# Patient Record
Sex: Male | Born: 1965 | Race: White | Hispanic: No | Marital: Single | Smoking: Current every day smoker
Health system: Southern US, Community
[De-identification: ages and names within clinical notes are randomized; demographics above are authoritative.]

## PROBLEM LIST (undated history)

## (undated) ENCOUNTER — Emergency Department (HOSPITAL_BASED_OUTPATIENT_CLINIC_OR_DEPARTMENT_OTHER): Admission: EM | Discharge status: Absent without leave | Payer: Self-pay

## (undated) DIAGNOSIS — I639 Cerebral infarction, unspecified: Secondary | ICD-10-CM

## (undated) DIAGNOSIS — I2510 Atherosclerotic heart disease of native coronary artery without angina pectoris: Secondary | ICD-10-CM

## (undated) DIAGNOSIS — I219 Acute myocardial infarction, unspecified: Secondary | ICD-10-CM

## (undated) DIAGNOSIS — F419 Anxiety disorder, unspecified: Secondary | ICD-10-CM

## (undated) HISTORY — PX: APPENDECTOMY: SHX54

## (undated) HISTORY — PX: SHOULDER SURGERY: SHX246

## (undated) NOTE — *Deleted (*Deleted)
1.  We will schedule a lumbar puncture for you.  No aspirin or aspirin containing products or blood thinners at least 5 days prior to procedure 2.  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  We referred you back to Dr. Darrick Penna for consultation for that. 3.  We have sent a referral to [facility] for your MRV of the brain and they will call you directly to schedule your appointment. They are located at [address] [facility]. If you need to contact them directly please call [phone].

---

## 2000-02-05 ENCOUNTER — Emergency Department (HOSPITAL_COMMUNITY): Admission: EM | Admit: 2000-02-05 | Discharge: 2000-02-05 | Payer: Self-pay | Admitting: Emergency Medicine

## 2004-07-28 ENCOUNTER — Emergency Department (HOSPITAL_COMMUNITY): Admission: EM | Admit: 2004-07-28 | Discharge: 2004-07-28 | Payer: Self-pay | Admitting: Emergency Medicine

## 2005-09-09 ENCOUNTER — Emergency Department (HOSPITAL_COMMUNITY): Admission: EM | Admit: 2005-09-09 | Discharge: 2005-09-09 | Payer: Self-pay | Admitting: Emergency Medicine

## 2005-11-07 ENCOUNTER — Encounter: Payer: Self-pay | Admitting: Emergency Medicine

## 2005-11-08 ENCOUNTER — Inpatient Hospital Stay (HOSPITAL_COMMUNITY): Admission: EM | Admit: 2005-11-08 | Discharge: 2005-11-09 | Payer: Self-pay | Admitting: Internal Medicine

## 2006-10-08 ENCOUNTER — Emergency Department (HOSPITAL_COMMUNITY): Admission: EM | Admit: 2006-10-08 | Discharge: 2006-10-08 | Payer: Self-pay | Admitting: Emergency Medicine

## 2008-05-24 ENCOUNTER — Emergency Department (HOSPITAL_BASED_OUTPATIENT_CLINIC_OR_DEPARTMENT_OTHER): Admission: EM | Admit: 2008-05-24 | Discharge: 2008-05-24 | Payer: Self-pay | Admitting: Emergency Medicine

## 2008-05-24 ENCOUNTER — Ambulatory Visit: Payer: Self-pay | Admitting: Diagnostic Radiology

## 2010-02-26 ENCOUNTER — Encounter: Payer: Self-pay | Admitting: Family Medicine

## 2010-06-23 NOTE — H&P (Signed)
Ralph [name], Ralph [name] NO.:  [id number]   MEDICAL RECORD NO.:  [id number]          PATIENT TYPE:  INP   LOCATION:  3705                         FACILITY:  [facility]   PHYSICIAN:  Della Goo, M.D. DATE OF BIRTH:  1965-02-15   DATE OF ADMISSION:  11/08/2005  DATE OF DISCHARGE:  11/09/2005                                HISTORY & PHYSICAL   CHIEF COMPLAINT:  Chest pain.   HISTORY OF PRESENT ILLNESS:  This is a 45 year old male who presented to the  emergency department via ambulance secondary to worsening chest pain that  was associated with shortness of breath, nausea, and diaphoresis.  Patient  reports having chest pain which was described as a dull left-sided chest  area pain for 2 weeks which began to worsen over the past 24 hours when it  was associated with nausea, shortness of breath, and diaphoresis.  Patient  denies having any cough, fevers, chills.  He did report having a radiation  of discomfort into his left arm which was described as being numbness that  started upon awakening the day before, and lasted until his arrival in the  emergency department.  However, symptoms of this discomfort and numbness did  gradually improve over the day.  He does state that his arm discomfort could  have been from sleeping on his arm.   PAST MEDICAL HISTORY:  1. Hypertension.  2. 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   MEDICATIONS:  1. Lisinopril 20 mg 1 p.o. daily.  2. Aspirin 81 mg 1 p.o. daily.   ALLERGIES:  NO KNOWN DRUG ALLERGIES.   SOCIAL HISTORY:  Tobacco usage, 1 to 1-1/2 packs per day.  Previous heavy  drinking.  No history of drug abuse.   REVIEW OF SYSTEMS:  Mentioned above.   FAMILY HISTORY:  Both parents with hypertension.  No history of coronary  artery disease, or diabetes, or cancer.   PHYSICAL EXAMINATION:  A 45 year old well-nourished, well-developed male in  discomfort, but no acute distress.   VITAL SIGNS:  On admission temperature  97, blood pressure initially 158/110, heart rate 79, respirations 20, O2  saturations 98%.  Recheck of his vitals reveal a blood pressure of 138/94  after administration of sublingual nitroglycerine and application of  nitropaste.  HEENT:  Normocephalic, atraumatic.  Pupils equally round, react to light.  Extraocular muscles are intact.  Funduscopic examination benign.  Oropharynx  is clear.  NECK:  Supple.  Full range of motion.  No thyromegaly, adenopathy or jugular  venous distention.  CARDIOVASCULAR:  Regular rate, rhythm.  No murmurs, gallops, rubs.  LUNGS:  Clear to auscultation.  No rales, rhonchi, or wheezes.  ABDOMEN:  Positive bowel sounds.  Soft, nontender, nondistended.  EXTREMITIES:  Without edema.  GENITOURINARY:  Deferred.  RECTAL:  Deferred.  NEUROLOGIC:  Patient is alert and oriented by 3.  Cranial nerves are intact.  Motor and sensory function, and cerebellar function are intact.  MUSCULOSKELETAL:  5/5 strength and full range of motion throughout.   LABORATORY DATA:  White blood cell count 9.3, hemoglobin 16, hematocrit  45.7, platelets 295.  Neutrophils 65%, and lymphocytes 23%.  Sodium 138,  potassium 4.2, chloride 106.  Carbon dioxide 27, BUN 8, creatinine 0.9,  glucose 103.  Prothrombin time 11.9, INR 0.9.  D-dimer less than 0.22.  Cardiac enzymes:  myoglobin 17.9, CK-MB less than 1, troponin less than  0.05.  Chest x-ray reveals no active disease or acute process.  EKG findings  reveal a normal sinus rhythm without acute ST segment changes.   ASSESSMENT:  A 45 year old male with risk factors of tobacco usage,  presenting to the emergency department with atypical chest pain.  Additional  diagnoses include hypertension, tobacco abuse.   PLAN:  Patient will be admitted to telemetry area.  Cardiac enzymes will be  ordered q.8 hours x3.  Patient will be placed on medical therapy of  nitrates, low-dose beta-blocker, and  continue his ACE inhibitor therapy.  The patient will continue on aspirin therapy and will be also be placed on  nasal cannula oxygen therapy.  Patient will also be medicated for pain as  needed and will be placed on Lovenox therapy, and GI prophylaxis.      Della Goo, M.D.  Electronically Signed     HJ/MEDQ  D:  11/08/2005  T:  11/10/2005  Job:  [id number]

## 2010-06-23 NOTE — Discharge Summary (Signed)
NAMEJANCARLOS, THRUN NO.:  [id number]   MEDICAL RECORD NO.:  [id number]          PATIENT TYPE:  INP   LOCATION:  3705                         FACILITY:  [facility]   PHYSICIAN:  Isidor Holts, M.D.  DATE OF BIRTH:  Dec 02, 1965   DATE OF ADMISSION:  11/08/2005  DATE OF DISCHARGE:  11/09/2005                                 DISCHARGE SUMMARY   DISCHARGE DIAGNOSES:  1. Recurrent chest pain.  2. Smoker.  3. Hypertension.  4. Alcohol excess.   DISCHARGE MEDICATIONS:  1. Aspirin 650 mg p.o. b.i.d.  2. Lisinopril 20 mg p.o. daily.  3. Avelox 400 mg daily x5 days from November 10, 2005.  4. Nitroglycerin 0.4 mg one sublingually p.r.n. every 5 minutes for chest      pain.   PROCEDURE:  Portable chest x-ray dated November 07, 2005, showing no acute  findings.  There were mild chronic bronchitic changes.   CONSULTATIONS:  Rosine Abe, M.D., cardiology.   HISTORY OF PRESENT ILLNESS:  Refer to H&P notes of November 08, 2005, however,  in brief, this is a 45 year old male who presents with left-sided chest pain  which had been sort of grumbling on for approximately 1 week, but became  acute in the past 2 days prior to this presentation, mainly exertional,  associated with diaphoresis and dyspnea.  The patient admitted for further  evaluation, investigation and management.   HOSPITAL COURSE:  Problem 1.  CHEST PAIN, RECURRENT:  The patient has risk  factors for coronary artery disease, including hypertension and a smoking  history.  He was therefore monitored on telemetry. EKG showed no acute  ischemic changes.  Cardiac enzymes remained unelevated.  Lipid profile  showed cholesterol 183, triglycerides 167, HDL 35, LDL 115.  The patient was  placed on low-dose Aspirin, as well as Nitroglycerin patch.  He had no  further recurrence of chest pain during the course of this hospitalization.  Cardiology consultation was called, which was kindly provided by Dr. Reyes Ivan,  who  recommended placing the patient on a higher dose, i.e. 650 mg b.i.d. of  Aspirin, for possible pericarditic component of chest pain, but plan to do a  stress echocardiogram on November 12, 2005.  Nitroglycerin paste has been  discontinued.  The patient was discharged with sublingual Nitroglycerin.   Problem 2.  HYPERTENSION:  The patient was continued on his pre-admission  dose of lisinopril and remained normotensive throughout the course of  hospitalization.   Problem 3.  SMOKING HISTORY:  The patient has been counseled appropriately.  He was utilized Nicoderm CQ patch during the course of his hospitalization.   Problem 4.  ALCOHOL EXCESS:  The patient has been counseled appropriately  for this, however, during the course of his hospitalization, no withdrawal  phenomena were noted.   Problem 5.  POSSIBLE ACUTE BRONCHITIS:  In addition to symptoms noted in  presenting history, the patient admits to a possible upper respiratory tract  illness on presentation, including a nonproductive cough.  Chest x-ray  showed chronic bronchitic changes.  The patient has therefore been  prescribed 7-day course of empiric Avelox, to be completed on November 14, 2005.   DISPOSITION:  The patient was deemed clinically stable to be discharged on  November 09, 2005.   DIET:  Healthy-heart diet.   PAIN MANAGEMENT:  Refer to discharge medication list.   FOLLOW UP:  The patient is to follow up with Dr. Reyes Ivan, cardiologist,  telephone number [phone], for stress echocardiogram scheduled for November 12, 2005, at 8 a.m.   SPECIAL INSTRUCTIONS:  The patient has been strongly recommended to abstain  from smoking.  All this has been communicated.  The patient verbalizes  understanding.      Isidor Holts, M.D.  Electronically Signed     CO/MEDQ  D:  11/09/2005  T:  11/10/2005  Job:  [id number]   cc:   Elmore Guise., M.D.

## 2010-11-17 LAB — URINALYSIS, ROUTINE W REFLEX MICROSCOPIC
Bilirubin Urine: NEGATIVE
Hgb urine dipstick: NEGATIVE
Ketones, ur: NEGATIVE
Nitrite: NEGATIVE
Specific Gravity, Urine: 1.006
pH: 5.5

## 2012-11-25 ENCOUNTER — Encounter: Payer: Self-pay | Admitting: Family Medicine

## 2012-11-25 ENCOUNTER — Ambulatory Visit (INDEPENDENT_AMBULATORY_CARE_PROVIDER_SITE_OTHER): Payer: Self-pay | Admitting: Family Medicine

## 2012-11-25 VITALS — BP 138/100 | HR 73 | Temp 98.0°F | Ht 67.0 in | Wt 177.0 lb

## 2012-11-25 DIAGNOSIS — Z716 Tobacco abuse counseling: Secondary | ICD-10-CM | POA: Insufficient documentation

## 2012-11-25 DIAGNOSIS — I739 Peripheral vascular disease, unspecified: Secondary | ICD-10-CM | POA: Insufficient documentation

## 2012-11-25 DIAGNOSIS — F17200 Nicotine dependence, unspecified, uncomplicated: Secondary | ICD-10-CM

## 2012-11-25 DIAGNOSIS — I679 Cerebrovascular disease, unspecified: Secondary | ICD-10-CM | POA: Insufficient documentation

## 2012-11-25 DIAGNOSIS — I2510 Atherosclerotic heart disease of native coronary artery without angina pectoris: Secondary | ICD-10-CM

## 2012-11-25 DIAGNOSIS — I10 Essential (primary) hypertension: Secondary | ICD-10-CM | POA: Insufficient documentation

## 2012-11-25 DIAGNOSIS — Z7189 Other specified counseling: Secondary | ICD-10-CM

## 2012-11-25 LAB — LIPID PANEL
HDL: 50 mg/dL (ref >39)
LDL Cholesterol: 125 mg/dL — ABNORMAL HIGH (ref 0–99)
Triglycerides: 96 mg/dL (ref <150)
VLDL: 19 mg/dL (ref 0–40)

## 2012-11-25 MED ORDER — METOPROLOL SUCCINATE ER 50 MG PO TB24: 50.0000 mg | ORAL_TABLET | Freq: Every day | ORAL | Status: DC

## 2012-11-25 MED ORDER — ASPIRIN 81 MG PO CHEW: 81.0000 mg | CHEWABLE_TABLET | Freq: Every day | ORAL | Status: AC

## 2012-11-25 MED ORDER — NITROGLYCERIN 0.4 MG SL SUBL: 0.4000 mg | SUBLINGUAL_TABLET | SUBLINGUAL | Status: DC | PRN

## 2012-11-25 MED ORDER — ATORVASTATIN CALCIUM 40 MG PO TABS: 40.0000 mg | ORAL_TABLET | Freq: Every day | ORAL | Status: DC

## 2012-11-25 NOTE — Assessment & Plan Note (Signed)
Given message of serious problems and absolutely critical that he stop smoking.

## 2012-11-25 NOTE — Assessment & Plan Note (Signed)
Again, likely but not proven.

## 2012-11-25 NOTE — Patient Instructions (Addendum)
QUIT SMOKING Keep taking the aspirin every day.  It is proven to prevent heart attacks and strokes. New medicines that you take regularly. Atorvastatin is to lower your cholesterol. It is proven to prevent heart attacks and strokes. Metoprolol is to lower your blood pressure. It is proven to prevent second heart attacks.  Nitroglycerin is carried with you.  You take it only if you are having chest pain.  Don't forget to quit smoking See me in one week. I will call with cholesterol results. Call our office and ask to speak to [facility] about whether you qualify for the orange card.

## 2012-11-25 NOTE — Assessment & Plan Note (Signed)
Diastolic hypertension deserves treatment.

## 2012-11-25 NOTE — Assessment & Plan Note (Signed)
Sounds like recent lacunar infarct caused left hemiparesis.  Aggressive RX with ASA, metoprolol and atorvastatin.  Possibly add plavix next visit in one week.

## 2012-11-25 NOTE — Assessment & Plan Note (Signed)
Likely but not proven.  Treat with metoprolol to prevent 2nd MI.  Will need further work up - stress test and echo in the future. Sublingual nitro prn.

## 2012-11-25 NOTE — Progress Notes (Signed)
  Subjective:    Patient ID: Ralph [name], male    DOB: 1965/07/16, 47 y.o.   MRN: [id number]  HPI Seen at [facility] on 11/07/2012 with symptoms of both chest pain and facial paralysis and slurred speech.  Workup I was able to see through care everywhere and it included. Normal chemistry and CBC.  Neg Trop, CKMB.  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  Told to take an 81 mg aspirin daily and see his PCP.  They did not do cholesterol.  Did an EKG which has a similar reading as my EKG from today.    Risk factors 1.5 ppd. Strongly positive family hx of CAD and CVA Never had cholesterol measured. No known diabetes and neither body habitus or blood work from [place] suggest DM Was a big drinker - quit 6 months.  Now, maybe one beer per week.    Now complains of pain and hyperesthesia of left side.  Has left leg numbness and weakness. Left arm feels numb - no weakness.  No facial weakness or numbness.  He feels like he is talking normal but wife notes significant dysarthria.   Has had episodes of chest pain in the past.  EKG from both here and [facility] suggests septal infarct age indeterminant.  No acute changes.  No chest pain since seen in ER.  Prior to last CVA had pain in both legs Left >Right on walking relieved by rest.   Finally, no insurance.  Will need further Work up but focus will be on treatment for now.      Review of Systems     Objective:   Physical ExamHEENT normal Neck, no bruits. Cardiac RRR without m or g Lungs clear Abd benign Legs. Diminished pulses both feet and loss of hair Neuro.  Subjective complaints of numbness.  I cannot demonstrate any arm or leg weakness.  His gait is slow with a slight favoring of his left leg.         Assessment & Plan:

## 2012-11-26 ENCOUNTER — Encounter: Payer: Self-pay | Admitting: Family Medicine

## 2012-11-26 DIAGNOSIS — E7800 Pure hypercholesterolemia, unspecified: Secondary | ICD-10-CM | POA: Insufficient documentation

## 2012-11-26 NOTE — Progress Notes (Signed)
Patient ID: Ralph [name], male   DOB: 1965-07-17, 47 y.o.   MRN: [id number] Added hypercholesterolemia to problem list.

## 2012-11-28 ENCOUNTER — Telehealth: Payer: Self-pay

## 2012-11-28 NOTE — Telephone Encounter (Signed)
[phone] ext [phone]

## 2012-11-28 NOTE — Telephone Encounter (Signed)
Patient's fiance calls stating she would like to speak with Dr. Leveda Anna about meds that patient was placed on. Please call patient back.

## 2012-12-01 MED ORDER — SIMVASTATIN 40 MG PO TABS: 40.0000 mg | ORAL_TABLET | Freq: Every day | ORAL | Status: DC

## 2012-12-01 NOTE — Telephone Encounter (Signed)
Lipitor too expensive - >$100 and cannot afford.  Will switch to simvastatin.

## 2012-12-03 ENCOUNTER — Ambulatory Visit (INDEPENDENT_AMBULATORY_CARE_PROVIDER_SITE_OTHER): Payer: Self-pay | Admitting: Family Medicine

## 2012-12-03 ENCOUNTER — Encounter: Payer: Self-pay | Admitting: Family Medicine

## 2012-12-03 VITALS — BP 118/87 | HR 68 | Temp 98.1°F | Ht 67.0 in | Wt 158.8 lb

## 2012-12-03 DIAGNOSIS — Z23 Encounter for immunization: Secondary | ICD-10-CM

## 2012-12-03 DIAGNOSIS — I679 Cerebrovascular disease, unspecified: Secondary | ICD-10-CM

## 2012-12-03 DIAGNOSIS — Z716 Tobacco abuse counseling: Secondary | ICD-10-CM

## 2012-12-03 DIAGNOSIS — F17200 Nicotine dependence, unspecified, uncomplicated: Secondary | ICD-10-CM

## 2012-12-03 DIAGNOSIS — Z7189 Other specified counseling: Secondary | ICD-10-CM

## 2012-12-03 DIAGNOSIS — I10 Essential (primary) hypertension: Secondary | ICD-10-CM

## 2012-12-03 MED ORDER — GABAPENTIN 300 MG PO CAPS: 300.0000 mg | ORAL_CAPSULE | Freq: Three times a day (TID) | ORAL | Status: DC

## 2012-12-03 MED ORDER — CLOPIDOGREL BISULFATE 75 MG PO TABS: 75.0000 mg | ORAL_TABLET | Freq: Every day | ORAL | Status: DC

## 2012-12-03 NOTE — Patient Instructions (Signed)
You look good and your blood pressure is good.  It is a good sign that you have not had any chest pain. I still plan to do more testing - but can wait until you apply for the orange card. Two new medicines: Plavix - a blood thinner.  You will be on aspirin and plavix for three months, then just plavix Gabapentin for nerve pain. Let me know if either of these are too costly. You got a flu shot today and a tetanus shot.  Tetanus shots are good for ten years. Good job on cutting back on the smoking.   Pick a quick date in November and stick to it. See me in no later than one month.  Sooner if you qualify for the orange card because I want to do more testing.

## 2012-12-04 NOTE — Assessment & Plan Note (Signed)
Well controled.  No change 

## 2012-12-04 NOTE — Assessment & Plan Note (Signed)
Good progress.  Encouraged to set a quit date in Nov.

## 2012-12-04 NOTE — Assessment & Plan Note (Signed)
Because his clinical picture is that of an acute CVA, will treat with ASA and plavix x 3 months.  Add gabapentin for sx of hyperesthesia.

## 2012-12-04 NOTE — Progress Notes (Signed)
  Subjective:    Patient ID: Ralph [name], male    DOB: [birth year], 47 y.o.   MRN: [id number]  HPI  No change in symptoms of aphasia or left sided hyperesthesia.  No new complaints.  Specifically denies any worsening neuro sx or chest pain.    He has made good progress with smoking cessation.  Down to 1/2 ppd from 2ppd.  Highly motivated.  We had previously discussed his lab results.    Review of Systems     Objective:   Physical ExamLungs clear Cardiac RRR without m or g Speech is normal to me, but wife states it is a little slow.   Hyperesthesias on left side.  I really can't demostrate any weakness Gait is normal        Assessment & Plan:

## 2013-01-08 ENCOUNTER — Telehealth: Payer: Self-pay | Admitting: Family Medicine

## 2013-01-08 NOTE — Telephone Encounter (Signed)
Patient fiance calls stating that she feels patient has shingles again. Would like to speak to Dr. Leveda Anna, if possible. Please call either [phone] or [phone].

## 2013-01-09 NOTE — Telephone Encounter (Signed)
Left message on patients voicemail to schedule an appointment to be seen. Ralph [name], [name]

## 2013-01-12 ENCOUNTER — Ambulatory Visit (INDEPENDENT_AMBULATORY_CARE_PROVIDER_SITE_OTHER): Payer: Self-pay | Admitting: Family Medicine

## 2013-01-12 ENCOUNTER — Encounter: Payer: Self-pay | Admitting: Family Medicine

## 2013-01-12 VITALS — BP 155/94 | HR 78 | Temp 97.7°F | Wt 176.0 lb

## 2013-01-12 DIAGNOSIS — B029 Zoster without complications: Secondary | ICD-10-CM | POA: Insufficient documentation

## 2013-01-12 MED ORDER — VALACYCLOVIR HCL 1 G PO TABS: 1000.0000 mg | ORAL_TABLET | Freq: Three times a day (TID) | ORAL | Status: DC

## 2013-01-12 MED ORDER — TRAMADOL HCL 50 MG PO TABS: 50.0000 mg | ORAL_TABLET | Freq: Three times a day (TID) | ORAL | Status: DC | PRN

## 2013-01-12 NOTE — Patient Instructions (Signed)
Thank you for coming in today We will treat you with valtrex and tramadol.  Please come back in 4 weeks to talk about a shingles shot with Dr. Leveda Anna.  Shingles Shingles (herpes zoster) is an infection that is caused by the same virus that causes chickenpox (varicella). The infection causes a painful skin rash and fluid-filled blisters, which eventually break open, crust over, and heal. It may occur in any area of the body, but it usually affects only one side of the body or face. The pain of shingles usually lasts about 1 month. However, some people with shingles may develop long-term (chronic) pain in the affected area of the body. Shingles often occurs many years after the person had chickenpox. It is more common:  In people older than 50 years.  In people with weakened immune systems, such as those with HIV, AIDS, or cancer.  In people taking medicines that weaken the immune system, such as transplant medicines.  In people under great stress. CAUSES  Shingles is caused by the varicella zoster virus (VZV), which also causes chickenpox. After a person is infected with the virus, it can remain in the person's body for years in an inactive state (dormant). To cause shingles, the virus reactivates and breaks out as an infection in a nerve root. The virus can be spread from person to person (contagious) through contact with open blisters of the shingles rash. It will only spread to people who have not had chickenpox. When these people are exposed to the virus, they may develop chickenpox. They will not develop shingles. Once the blisters scab over, the person is no longer contagious and cannot spread the virus to others. SYMPTOMS  Shingles shows up in stages. The initial symptoms may be pain, itching, and tingling in an area of the skin. This pain is usually described as burning, stabbing, or throbbing.In a few days or weeks, a painful red rash will appear in the area where the pain, itching, and  tingling were felt. The rash is usually on one side of the body in a band or belt-like pattern. Then, the rash usually turns into fluid-filled blisters. They will scab over and dry up in approximately 2 3 weeks. Flu-like symptoms may also occur with the initial symptoms, the rash, or the blisters. These may include:  Fever.  Chills.  Headache.  Upset stomach. DIAGNOSIS  Your caregiver will perform a skin exam to diagnose shingles. Skin scrapings or fluid samples may also be taken from the blisters. This sample will be examined under a microscope or sent to a lab for further testing. TREATMENT  There is no specific cure for shingles. Your caregiver will likely prescribe medicines to help you manage the pain, recover faster, and avoid long-term problems. This may include antiviral drugs, anti-inflammatory drugs, and pain medicines. HOME CARE INSTRUCTIONS   Take a cool bath or apply cool compresses to the area of the rash or blisters as directed. This may help with the pain and itching.   Only take over-the-counter or prescription medicines as directed by your caregiver.   Rest as directed by your caregiver.  Keep your rash and blisters clean with mild soap and cool water or as directed by your caregiver.  Do not pick your blisters or scratch your rash. Apply an anti-itch cream or numbing creams to the affected area as directed by your caregiver.  Keep your shingles rash covered with a loose bandage (dressing).  Avoid skin contact with:  Babies.   Pregnant  women.   Children with eczema.   Elderly people with transplants.   People with chronic illnesses, such as leukemia or AIDS.   Wear loose-fitting clothing to help ease the pain of material rubbing against the rash.  Keep all follow-up appointments with your caregiver.If the area involved is on your face, you may receive a referral for follow-up to a specialist, such as an eye doctor (ophthalmologist) or an ear, nose,  and throat (ENT) doctor. Keeping all follow-up appointments will help you avoid eye complications, chronic pain, or disability.  SEEK IMMEDIATE MEDICAL CARE IF:   You have facial pain, pain around the eye area, or loss of feeling on one side of your face.  You have ear pain or ringing in your ear.  You have loss of taste.  Your pain is not relieved with prescribed medicines.   Your redness or swelling spreads.   You have more pain and swelling.  Your condition is worsening or has changed.   You have a feveror persistent symptoms for more than 2 3 days.  You have a fever and your symptoms suddenly get worse. MAKE SURE YOU:  Understand these instructions.  Will watch your condition.  Will get help right away if you are not doing well or get worse. Document Released: 01/22/2005 Document Revised: 10/17/2011 Document Reviewed: 09/06/2011 [facility] Patient Information 2014 [place].

## 2013-01-12 NOTE — Assessment & Plan Note (Addendum)
Shingles outbreak Second outbreak in last 6 years Scarring from previous attack Valcyclovir (right at 72 hrs) and tramadol (no NSAID due to ASA/Plavix) 71mo f/u w/ PCP for likely shingles vaccine Handout given

## 2013-01-12 NOTE — Progress Notes (Signed)
Ralph [name] is a 47 y.o. male who presents to [facility] today for shingles  Painful area of R back. Has had shingles before on the L back about 7 years ago. SKin burning pain. Lower r back. Red and raised skin. Thursday is the start of symptoms. Wakes up at night. Fevers, n/v/ AMS, HA, syncope.   The following portions of the patient's history were reviewed and updated as appropriate: allergies, current medications, past medical history, family and social history, and problem list.  Patient is a smoker.  No past medical history on file.  ROS as above otherwise neg.    Medications reviewed. Current Outpatient Prescriptions  Medication Sig Dispense Refill  . aspirin 81 MG chewable tablet Chew 1 tablet (81 mg total) by mouth daily.  1 tablet  0  . clopidogrel (PLAVIX) 75 MG tablet Take 1 tablet (75 mg total) by mouth daily.  30 tablet  3  . gabapentin (NEURONTIN) 300 MG capsule Take 1 capsule (300 mg total) by mouth 3 (three) times daily.  90 capsule  3  . metoprolol succinate (TOPROL-XL) 50 MG 24 hr tablet Take 1 tablet (50 mg total) by mouth daily. Take with or immediately following a meal.  30 tablet  3  . nitroGLYCERIN (NITROSTAT) 0.4 MG SL tablet Place 1 tablet (0.4 mg total) under the tongue every 5 (five) minutes as needed for chest pain.  20 tablet  3  . simvastatin (ZOCOR) 40 MG tablet Take 1 tablet (40 mg total) by mouth at bedtime.  30 tablet  3  . traMADol (ULTRAM) 50 MG tablet Take 1 tablet (50 mg total) by mouth every 8 (eight) hours as needed.  30 tablet  0  . valACYclovir (VALTREX) 1000 MG tablet Take 1 tablet (1,000 mg total) by mouth 3 (three) times daily.  21 tablet  0   No current facility-administered medications for this visit.    Exam:  BP 155/94  Pulse 78  Temp(Src) 97.7 F (36.5 C) (Oral)  Wt 176 lb (79.833 kg) Gen: Well NAD HEENT: EOMI,  MMM Skin: R lumbar dermatome distribution of pain w/o clear evidence of rash. Exquisitely tender to pt  No results found for  this or any previous visit (from the past 72 hour(s)).  A/P (as seen in Problem list)  Shingles Shingles outbreak Second outbreak in last 6 years Scarring from previous attack Valcyclovir (right at 72 hrs) and tramadol (no NSAID due to ASA/Plavix) 4 wk f/u w/ PCP for likely shingles vaccine Handout given

## 2013-01-14 ENCOUNTER — Telehealth: Payer: Self-pay | Admitting: Family Medicine

## 2013-01-14 DIAGNOSIS — B029 Zoster without complications: Secondary | ICD-10-CM

## 2013-01-14 MED ORDER — HYDROCODONE-ACETAMINOPHEN 5-325 MG PO TABS: 1.0000 | ORAL_TABLET | Freq: Four times a day (QID) | ORAL | Status: DC | PRN

## 2013-01-14 NOTE — Telephone Encounter (Signed)
Pt was seen by Dr. Konrad Dolores for shingles and was prescribed Tramado 50 mg. He said that he needs something stronger than that. Myriam Jacobson

## 2013-01-14 NOTE — Telephone Encounter (Signed)
He has no history of drug abuse but his partner is a recovering narcotic addict.  Will give small Rx for hydrocodone.

## 2014-07-07 ENCOUNTER — Telehealth: Payer: Self-pay | Admitting: Family Medicine

## 2014-07-07 NOTE — Telephone Encounter (Signed)
Done

## 2014-07-07 NOTE — Telephone Encounter (Signed)
Pt called and informed that the requested letter was complete and would be up front for him to pick up. Lamonte SakaiZimmerman Rumple, Rochelle Larue D, [place]

## 2014-07-07 NOTE — Telephone Encounter (Signed)
[name] is calling because he was asked by his lawyer to bring documentation to court with him tomorrow morning showing verification of the stroke that he had in the past. He really needs something before tomorrow morning. Please call the patient at the mobile number provided to let him know when and if it is ready. Thank you, Dorothey BasemanSadie Reynolds, ASA

## 2014-09-29 ENCOUNTER — Telehealth: Payer: Self-pay | Admitting: *Deleted

## 2014-09-29 NOTE — Telephone Encounter (Signed)
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  The only way he will be able to get his DOT approval is if he receives a letter from his PCP stating that he is cleared.  Patient states that he is not on any medication from his stroke.  Also he needs this completed within 3 days.  Informed him that a nurse will contact him when the note is ready.  Please forward response to white team once completed.  Thanks [employer]

## 2014-09-30 NOTE — Telephone Encounter (Signed)
Pt had DOT physical done at [facility] in [place] Needs letter stating he doesn't  Take any medicaitons. [facility] gave him  3 days from Tuesday to bring the letter back so the physical can be complete and given his health card Please advise

## 2014-09-30 NOTE — Telephone Encounter (Signed)
Patient called this morning requesting a letter to get back to work ASAP.  Please see message below.  Advised patient that his PCP will not be back in the office until next week.  Will forward message to covering provider.  Clovis Pu, RN

## 2014-09-30 NOTE — Telephone Encounter (Signed)
Dear Ralph [name] Team I am sorry--per his CHART he is on several medicines so I do not think I can give him a letter stating that he doesn't take any.  He will have to wait on Dr. Cyndia Skeeters return [facility]! [name]

## 2014-09-30 NOTE — Telephone Encounter (Signed)
Pt calling to check on status of this request. Would also like to provide another number where he can be reached. [phone]. Sadie Reynolds, ASA

## 2014-10-01 NOTE — Telephone Encounter (Signed)
Patient informed of this.

## 2014-10-04 ENCOUNTER — Telehealth: Payer: Self-pay | Admitting: Family Medicine

## 2014-10-04 NOTE — Telephone Encounter (Signed)
Error

## 2014-10-04 NOTE — Telephone Encounter (Signed)
Ralph [name] came by to pick up the letter.  I provided a fact based letter for him and urged him to make an appointment.

## 2014-10-04 NOTE — Telephone Encounter (Signed)
Pt is requesting letter stating he doesn't take any medications. He needs this letter today Would like to talk to dr Leveda Anna

## 2014-10-08 ENCOUNTER — Ambulatory Visit: Payer: Self-pay | Admitting: Family Medicine

## 2016-04-10 ENCOUNTER — Encounter (HOSPITAL_BASED_OUTPATIENT_CLINIC_OR_DEPARTMENT_OTHER): Payer: Self-pay | Admitting: Emergency Medicine

## 2016-04-10 ENCOUNTER — Emergency Department (HOSPITAL_BASED_OUTPATIENT_CLINIC_OR_DEPARTMENT_OTHER)
Admission: EM | Admit: 2016-04-10 | Discharge: 2016-04-10 | Disposition: A | Discharge status: Home / Self Care | Payer: Self-pay | Attending: Emergency Medicine | Admitting: Emergency Medicine

## 2016-04-10 ENCOUNTER — Emergency Department (HOSPITAL_BASED_OUTPATIENT_CLINIC_OR_DEPARTMENT_OTHER): Payer: Self-pay

## 2016-04-10 DIAGNOSIS — Y92009 Unspecified place in unspecified non-institutional (private) residence as the place of occurrence of the external cause: Secondary | ICD-10-CM | POA: Insufficient documentation

## 2016-04-10 DIAGNOSIS — Z7982 Long term (current) use of aspirin: Secondary | ICD-10-CM | POA: Insufficient documentation

## 2016-04-10 DIAGNOSIS — W108XXA Fall (on) (from) other stairs and steps, initial encounter: Secondary | ICD-10-CM | POA: Insufficient documentation

## 2016-04-10 DIAGNOSIS — F17210 Nicotine dependence, cigarettes, uncomplicated: Secondary | ICD-10-CM | POA: Insufficient documentation

## 2016-04-10 DIAGNOSIS — Y999 Unspecified external cause status: Secondary | ICD-10-CM | POA: Insufficient documentation

## 2016-04-10 DIAGNOSIS — Y939 Activity, unspecified: Secondary | ICD-10-CM | POA: Insufficient documentation

## 2016-04-10 DIAGNOSIS — S60221A Contusion of right hand, initial encounter: Secondary | ICD-10-CM | POA: Insufficient documentation

## 2016-04-10 DIAGNOSIS — I10 Essential (primary) hypertension: Secondary | ICD-10-CM | POA: Insufficient documentation

## 2016-04-10 DIAGNOSIS — I2510 Atherosclerotic heart disease of native coronary artery without angina pectoris: Secondary | ICD-10-CM | POA: Insufficient documentation

## 2016-04-10 HISTORY — DX: Cerebral infarction, unspecified: I63.9

## 2016-04-10 HISTORY — DX: Atherosclerotic heart disease of native coronary artery without angina pectoris: I25.10

## 2016-04-10 HISTORY — DX: Acute myocardial infarction, unspecified: I21.9

## 2016-04-10 MED ORDER — TRAMADOL HCL 50 MG PO TABS: 50.0000 mg | ORAL_TABLET | Freq: Four times a day (QID) | ORAL | 0 refills | Status: DC | PRN

## 2016-04-10 MED ORDER — HYDROCODONE-ACETAMINOPHEN 5-325 MG PO TABS
1.0000 | ORAL_TABLET | Freq: Once | ORAL | Status: AC
  Administered 2016-04-10: 1 via ORAL
  Filled 2016-04-10: qty 1

## 2016-04-10 NOTE — Discharge Instructions (Signed)
Return to the ED with any concerns including increased pain, swelling/numbness/discoloration of fingers or hand, or any other alarming symptoms °

## 2016-04-10 NOTE — ED Provider Notes (Signed)
[facility]-EMERGENCY DEPT Provider Note   CSN: [id number] Arrival date & time: 04/10/16  1234     History   Chief Complaint Chief Complaint  Patient presents with  . Hand Injury    HPI Ralph [name] is a 51 y.o. male.  HPI  Pt presenting with c/o pain in right hand.  He states he tripped and fell up a couple of steps going into his house last night- braced his weight on right hand- continues to have pain on ulnar side of right hand.  He has not had any treatment prior to arrival.  No break in the skin.  No other areas of pain.  He did state he hit the front of his head, no LOC, no vomiting or seizures.  He does not have neck or back pain.   Pain in hand is moderate and worse with movement and palpation.  There are no other associated systemic symptoms, there are no other alleviating or modifying factors.   Past Medical History:  Diagnosis Date  . Coronary artery disease   . Myocardial infarction   . Stroke [facility])     Patient Active Problem List   Diagnosis Date Noted  . Shingles 01/12/2013  . Pure hypercholesterolemia 11/26/2012  . Cerebrovascular disease 11/25/2012  . Peripheral vascular disease, unspecified 11/25/2012  . Coronary artery disease 11/25/2012  . Tobacco abuse counseling 11/25/2012  . Essential hypertension, benign 11/25/2012    Past Surgical History:  Procedure Laterality Date  . APPENDECTOMY    . SHOULDER SURGERY         Home Medications    Prior to Admission medications   Medication Sig Start Date End Date Taking? Authorizing Provider  aspirin EC 81 MG tablet Take 81 mg by mouth daily.   Yes Historical Provider, MD  clopidogrel (PLAVIX) 75 MG tablet Take 1 tablet (75 mg total) by mouth daily. 12/03/12   Moses Manners, MD  gabapentin (NEURONTIN) 300 MG capsule Take 1 capsule (300 mg total) by mouth 3 (three) times daily. 12/03/12   Moses Manners, MD  HYDROcodone-acetaminophen (NORCO/VICODIN) 5-325 MG per tablet Take 1 tablet by mouth every 6 (six)  hours as needed for severe pain. 01/14/13   Moses Manners, MD  metoprolol succinate (TOPROL-XL) 50 MG 24 hr tablet Take 1 tablet (50 mg total) by mouth daily. Take with or immediately following a meal. 11/25/12   Moses Manners, MD  nitroGLYCERIN (NITROSTAT) 0.4 MG SL tablet Place 1 tablet (0.4 mg total) under the tongue every 5 (five) minutes as needed for chest pain. 11/25/12   Moses Manners, MD  simvastatin (ZOCOR) 40 MG tablet Take 1 tablet (40 mg total) by mouth at bedtime. 12/01/12   Moses Manners, MD  traMADol (ULTRAM) 50 MG tablet Take 1 tablet (50 mg total) by mouth every 6 (six) hours as needed. 04/10/16   Jerelyn Scott, MD  valACYclovir (VALTREX) 1000 MG tablet Take 1 tablet (1,000 mg total) by mouth 3 (three) times daily. 01/12/13   Ozella Rocks, MD    Family History No family history on file.  Social History Social History  Substance Use Topics  . Smoking status: Current Every Day Smoker    Packs/day: 1.50    Years: 20.00    Types: Cigarettes  . Smokeless tobacco: Never Used  . Alcohol use 0.5 oz/week    1 Standard drinks or equivalent per week     Allergies   Patient has no known allergies.  Review of Systems Review of Systems  ROS reviewed and all otherwise negative except for mentioned in HPI   Physical Exam Updated Vital Signs BP (!) 147/106 (BP Location: Left Arm)   Pulse 65   Temp 98.3 F (36.8 C) (Oral)   Resp 16   Ht 5\' 8"  (1.727 m)   Wt 74.8 kg   SpO2 97%   BMI 25.09 kg/m  Vitals reviewed Physical Exam Physical Examination: General appearance - alert, well appearing, and in no distress Mental status - alert, oriented to person, place, and time Eyes - no conjunctival injection, no scleral icterus Neck - no midline tenderness to palpation Chest - clear to auscultation, no wheezes, rales or rhonchi, symmetric air entry Heart - normal rate, regular rhythm, normal S1, S2, no murmurs, rubs, clicks or gallops Neurological - alert,  oriented, normal speech, no focal findings or movement disorder noted Musculoskeletal - ttp over ulnar aspect of right dorsum of hand with some soft tissue swelling, sensation distally intact, no scaphoid tenderness, able to flex and extend and wrist and fingers, brisk cap refill distally Extremities - peripheral pulses normal, no pedal edema, no clubbing or cyanosis Skin - normal coloration and turgor, no rashes  ED Treatments / Results  Labs (all labs ordered are listed, but only abnormal results are displayed) Labs Reviewed - No data to display  EKG  EKG Interpretation None       Radiology No results found.  Procedures Procedures (including critical care time)  Medications Ordered in ED Medications  HYDROcodone-acetaminophen (NORCO/VICODIN) 5-325 MG per tablet 1 tablet (1 tablet Oral Given 04/10/16 1317)     Initial Impression / Assessment and Plan / ED Course  I have reviewed the triage vital signs and the nursing notes.  Pertinent labs & imaging results that were available during my care of the patient were reviewed by me and considered in my medical decision making (see chart for details).      pt presenting with c/o pain in right hand after fall- xray does not show fracture- pt placed in wrist splint.  Given referral for hand followup.  Discharged with strict return precautions.  Pt agreeable with plan.  Final Clinical Impressions(s) / ED Diagnoses   Final diagnoses:  Contusion of right hand, initial encounter    New Prescriptions Discharge Medication List as of 04/10/2016  1:48 PM       Jerelyn ScottMartha Linker, MD 04/14/16 1358

## 2016-04-10 NOTE — ED Triage Notes (Signed)
Pt fell up a step last night and now c/o RT hand pain/swelling. Also sts hit head, but did not lose consciousness

## 2017-02-18 ENCOUNTER — Ambulatory Visit: Payer: Self-pay | Attending: Family Medicine | Admitting: Physical Therapy

## 2017-02-18 ENCOUNTER — Encounter: Payer: Self-pay | Admitting: Physical Therapy

## 2017-02-18 VITALS — BP 135/98

## 2017-02-18 DIAGNOSIS — I69354 Hemiplegia and hemiparesis following cerebral infarction affecting left non-dominant side: Secondary | ICD-10-CM | POA: Insufficient documentation

## 2017-02-18 NOTE — Therapy (Signed)
[facility] [address] [place], [place], [address] Phone: [phone]   Fax:  [phone]  Physical Therapy Evaluation  Patient Details  Name: Ralph [name] MRN: [id number] Date of Birth: [birth year] Referring Provider: Addison NaegeliAshley Campbell, NP   Encounter Date: 02/18/2017  PT End of Session - 02/18/17 1546    Visit Number  1    Number of Visits  1    PT Start Time  1515    PT Stop Time  1541    PT Time Calculation (min)  26 min    Activity Tolerance  Patient tolerated treatment well    Behavior During Therapy  [facility] for tasks assessed/performed       Past Medical History:  Diagnosis Date  . Coronary artery disease   . Myocardial infarction (HCC)   . Stroke [facility](HCC)     Past Surgical History:  Procedure Laterality Date  . APPENDECTOMY    . SHOULDER SURGERY      Vitals:   02/18/17 1527  BP: (!) 135/98     Subjective Assessment - 02/18/17 1518    Subjective  Pt is a [age] y/o male who presents to OPPT s/p CVA on 12/09/16 with tPA resulting in Lt sided weakness and numbness.  Pt denies any falls. Pt hospitalized 11/4-11/9.    Pertinent History  CAD, MI, CVA    Limitations  Walking    Patient Stated Goals  back to work    Currently in Pain?  No/denies         [facility] PT Assessment - 02/18/17 1521      Assessment   Medical Diagnosis  CVA    Referring Provider  Addison NaegeliAshley Campbell, NP    Onset Date/Surgical Date  12/09/16    Hand Dominance  Right    Next MD Visit  03/15/17    Prior Therapy  only in hospital      Precautions   Precautions  Fall      Restrictions   Weight Bearing Restrictions  No      Balance Screen   Has the patient fallen in the past 6 months  No    Has the patient had a decrease in activity level because of a fear of falling?   No    Is the patient reluctant to leave their home because of a fear of falling?   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    Leisure  n/a      Cognition   Overall Cognitive Status  Within Functional Limits for tasks assessed      Observation/Other Assessments   Focus on Therapeutic Outcomes (FOTO)   not completed; 1x eval      ROM / Strength   AROM / PROM / Strength  Strength      Strength   Strength Assessment Site  Hip;Knee;Ankle    Right/Left Hip  Right;Left    Right Hip Flexion  5/5    Left Hip Flexion  4/5  Right/Left Knee  Right;Left    Right Knee Flexion  5/5    Right Knee Extension  5/5    Left Knee Flexion  4/5    Left Knee Extension  4/5    Right/Left Ankle  Right;Left    Right Ankle Dorsiflexion  5/5    Left Ankle Dorsiflexion  5/5      Ambulation/Gait   Ambulation/Gait  Yes    Ambulation/Gait Assistance  7: Independent    Ambulation Distance (Feet)  350 Feet    Assistive device  None    Gait Pattern  Within Functional Limits    Gait velocity  3.40 ft/sec 78m: 9.66 sec      Standardized Balance Assessment   Standardized Balance Assessment  Berg Balance Test      Berg Balance Test   Sit to Stand  Able to stand without using hands and stabilize independently    Standing Unsupported  Able to stand safely 2 minutes    Sitting with Back Unsupported but Feet Supported on Floor or Stool  Able to sit safely and securely 2 minutes    Stand to Sit  Sits safely with minimal use of hands    Transfers  Able to transfer safely, minor use of hands    Standing Unsupported with Eyes Closed  Able to stand 10 seconds safely    Standing Ubsupported with Feet Together  Able to place feet together independently and stand 1 minute safely     From Standing, Reach Forward with Outstretched Arm  Can reach confidently >25 cm (10")    From Standing Position, Pick up Object from Floor  Able to pick up shoe safely and easily    From Standing Position, Turn to Look Behind Over each Shoulder  Looks behind from both sides and weight shifts well    Turn 360 Degrees  Able to turn 360 degrees safely in 4 seconds or less    Standing Unsupported, Alternately Place Feet on Step/Stool  Able to stand independently and safely and complete 8 steps in 20 seconds    Standing Unsupported, One Foot in Front  Able to place foot tandem independently and hold 30 seconds    Standing on One Leg  Able to lift leg independently and hold > 10 seconds    Total Score  56      Functional Gait  Assessment   Gait assessed   Yes    Gait Level Surface  Walks 20 ft in less than 5.5 sec, no assistive devices, good speed, no evidence for imbalance, normal gait pattern, deviates no more than 6 in outside of the 12 in walkway width.    Change in Gait Speed  Able to smoothly change walking speed without loss of balance or gait deviation. Deviate no more than 6 in outside of the 12 in walkway width.    Gait with Horizontal Head Turns  Performs head turns smoothly with no change in gait. Deviates no more than 6 in outside 12 in walkway width    Gait with Vertical Head Turns  Performs head turns with no change in gait. Deviates no more than 6 in outside 12 in walkway width.    Gait and Pivot Turn  Pivot turns safely within 3 sec and stops quickly with no loss of balance.    Step Over Obstacle  Is able to step over 2 stacked shoe boxes taped together (9 in total height) without changing gait speed. No evidence of imbalance.  Gait with Narrow Base of Support  Is able to ambulate for 10 steps heel to toe with no staggering.    Gait with Eyes Closed  Walks 20 ft, no assistive devices, good speed, no evidence of imbalance, normal gait pattern, deviates no more than 6 in outside 12 in  walkway width. Ambulates 20 ft in less than 7 sec.    Ambulating Backwards  Walks 20 ft, no assistive devices, good speed, no evidence for imbalance, normal gait    Steps  Alternating feet, no rail.    Total Score  30             Objective measurements completed on examination: See above findings.              PT Education - 02/18/17 1545    Education provided  Yes    Education Details  process for OT referral; call office next week if he hasn't heard from [place] to get scheduled    Person(s) Educated  Patient    Methods  Explanation    Comprehension  Verbalized understanding                  Plan - 02/18/17 1546    Clinical Impression Statement  Pt is a 52 y/o male who presents to OPPT s/p CVA resulting in Lt sided weakness.  Pt demonstrates no skilled PT needs at this time, as FGA is 30/30 and BERG is 56/56.  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  Recommend OT referral to address these deficits.    History and Personal Factors relevant to plan of care:  HTN, MI, CAD    Clinical Presentation  Stable    Clinical Decision Making  Low    PT Frequency  One time visit    PT Next Visit Plan  one time visit; no PT needs identified    Consulted and Agree with Plan of Care  Patient       Patient will benefit from skilled therapeutic intervention in order to improve the following deficits and impairments:     Visit Diagnosis: Hemiplegia and hemiparesis following cerebral infarction affecting left non-dominant side (HCC) - Plan: PT plan of care cert/re-cert     Problem List Patient Active Problem List   Diagnosis Date Noted  . Shingles 01/12/2013  . Pure hypercholesterolemia 11/26/2012  . Cerebrovascular disease 11/25/2012  . Peripheral vascular disease, unspecified (HCC) 11/25/2012  . Coronary artery disease 11/25/2012  . Tobacco abuse counseling 11/25/2012  . Essential hypertension, benign 11/25/2012       Ralph [name], PT, DPT 02/18/17 3:49 PM     [facility] [address] [place], [place], [address] Phone: [phone]   Fax:  [phone]  Name: Ralph [name] MRN: [id number] Date of Birth: 1965/11/13

## 2017-02-27 ENCOUNTER — Encounter: Payer: Self-pay | Admitting: Occupational Therapy

## 2017-02-27 ENCOUNTER — Ambulatory Visit: Payer: Self-pay | Attending: Nurse Practitioner | Admitting: Occupational Therapy

## 2017-02-27 DIAGNOSIS — R278 Other lack of coordination: Secondary | ICD-10-CM | POA: Insufficient documentation

## 2017-02-27 DIAGNOSIS — M6281 Muscle weakness (generalized): Secondary | ICD-10-CM | POA: Insufficient documentation

## 2017-02-27 NOTE — Therapy (Signed)
[facility] [address] [place], [place], [address] Phone: [phone]   Fax:  [phone]  Occupational Therapy Evaluation  Patient Details  Name: Ralph BlackbirdKeith W [name] MRN: [id number] Date of Birth: December 31, 1965 Referring Provider: Dr. Addison NaegeliAshley Campbell, NP   Encounter Date: 02/27/2017  OT End of Session - 02/27/17 1550    Visit Number  1    Number of Visits  1    Date for OT Re-Evaluation  -- n/a    Authorization Type  self pay    OT Start Time  1448    OT Stop Time  1531    OT Time Calculation (min)  43 min    Activity Tolerance  Patient tolerated treatment well       Past Medical History:  Diagnosis Date  . Coronary artery disease   . Myocardial infarction (HCC)   . Stroke [facility](HCC)     Past Surgical History:  Procedure Laterality Date  . APPENDECTOMY    . SHOULDER SURGERY      There were no vitals filed for this visit.  Subjective Assessment - 02/27/17 1453    Subjective   My hand is getting better every day    Pertinent History  R CVA 02/08/2017.  CAD, MI,     Patient Stated Goals  I want to go back to work     Currently in Pain?  No/denies        [facility] - 02/27/17 0001      Assessment   Medical Diagnosis  R CVA    Referring Provider  Dr. Addison NaegeliAshley Campbell, NP    Hand Dominance  Right      Precautions   Precautions  None      Restrictions   Weight Bearing Restrictions  No      Balance Screen   Has the patient fallen in the past 6 months  No      Home  Environment   Family/patient expects to be discharged to:  Private residence    Living Arrangements  -- staying with a friend for now      Prior Function   Level of Independence  Independent    Vocation  Full time employment    Vocation Requirements  drives a dump truck    Leisure  nothing really just relax at home      ADL   Eating/Feeding  Independent    Grooming  Independent    Conservator, museum/galleryUpper Body Bathing  Independent    Lower Body  Bathing  Independent    Upper Body Dressing  Independent    Lower Body Dressing  Independent    Toilet Transfer  Independent    Toileting - Recruitment consultantClothing Manipulation  Independent    Toileting -  Geneticist, molecularHygiene  Independent    Tub/Shower Transfer  Independent      IADL   Shopping  Shops independently for small purchases    Light Housekeeping  Maintains house alone or with occasional assistance    Meal Prep  Plans, prepares and serves adequate meals independently    Data processing managerCommunity Mobility  Drives own vehicle    Medication Management  Is responsible for taking medication in correct dosages at correct time    Physicist, medicalinancial Management  Manages financial matters independently (budgets, writes checks, pays rent, bills goes to bank), collects and keeps track of income      Mobility   Mobility Status  Independent      Written Expression  Dominant Hand  Right      Vision - History   Baseline Vision  Wears glasses only for reading    Additional Comments  Pt denies any visual changes      Activity Tolerance   Activity Tolerance  Tolerate 30+ min activity without fatigue      Cognition   Overall Cognitive Status  Within Functional Limits for tasks assessed      Sensation   Light Touch  Appears Intact    Hot/Cold  Appears Intact    Proprioception  Appears Intact    Additional Comments  Pt reports hand was "numb" right after the stroke and now he only feels tingling "like was asleep and is waking up" - pt reports this gets better as the day goes on      Coordination   Gross Motor Movements are Fluid and Coordinated  Yes    Fine Motor Movements are Fluid and Coordinated  No mild incoordination    9 Hole Peg Test  Right;Left    Right 9 Hole Peg Test  22.51    Left 9 Hole Peg Test  38.66      Tone   Assessment Location  Left Upper Extremity      ROM / Strength   AROM / PROM / Strength  AROM;Strength      AROM   Overall AROM   Within functional limits for tasks performed    Overall AROM Comments   BUE's      Strength   Overall Strength  Within functional limits for tasks performed    Overall Strength Comments  Very slight decreased strength in LUE proximally however pt states he does not notice this functionally, has no drift with eyes closed and has no shoulder pain. Pt also with impaired grip strength in L hand - see below      Hand Function   Right Hand Gross Grasp  Functional    Right Hand Grip (lbs)  60    Left Hand Gross Grasp  Functional    Left Hand Grip (lbs)  45      LUE Tone   LUE Tone  Within Functional Limits                           OT Long Term Goals - 02/27/17 1540      OT LONG TERM GOAL #1   Title  Pt will be mod I with HEP for coordination and grip strength for LUE    Status  Achieved            Plan - 02/27/17 1540    Clinical Impression Statement  Pt is a 52 year old  male s/p R CVA on 02/08/2017.  Pt presents today with decreased grip strength and impaired coordination for LUE.  Pt issued HEP to address both issues and able to return demonstrate HEP indepdently.  Due to financial concerns, pt wishes to work on these deficits at home.  Pt is aware that should any issues arise he can return with new MD order for eval and tx.  Pt wishes to return to work as soon as possible. From OT standpoint, feel pt could return to work. Pt understands that he will need MD order to return to work as well as medical clearance to drive.  No further OT indicated at this time.    Occupational Profile and client history currently impacting functional performance  CAD, MI,  Occupational performance deficits (Please refer to evaluation for details):  Work    Mining engineer    Current Impairments/barriers affecting progress:  financial concerns.     Plan  no further OT indicated at this time    Consulted and Agree with Plan of Care  Patient       Patient will benefit from skilled therapeutic intervention in order to improve the following  deficits and impairments:  Decreased coordination, Impaired UE functional use, Decreased strength  Visit Diagnosis: Muscle weakness (generalized)  Other lack of coordination    Problem List Patient Active Problem List   Diagnosis Date Noted  . Shingles 01/12/2013  . Pure hypercholesterolemia 11/26/2012  . Cerebrovascular disease 11/25/2012  . Peripheral vascular disease, unspecified (HCC) 11/25/2012  . Coronary artery disease 11/25/2012  . Tobacco abuse counseling 11/25/2012  . Essential hypertension, benign 11/25/2012    Norton Pastel, OTR/L 02/27/2017, 3:52 PM  [facility] [address] [place], [place], [address] Phone: [phone]   Fax:  [phone]  Name: Ralph [name] MRN: [id number] Date of Birth: [birth year]

## 2017-02-27 NOTE — Patient Instructions (Signed)
  Coordination Activities  Perform the following activities for 15-20 minutes 1-2 times per day with left hand(s).   Rotate ball in fingertips (clockwise and counter-clockwise).  Toss ball between hands.  Toss ball in air and catch with the same hand.  Flip cards 1 at a time as fast as you can.  Deal cards with your thumb (Hold deck in hand and push card off top with thumb).  Pick up coins and place in container or coin bank.  Pick up coins and stack.  Pick up coins one at a time until you get 5-10 in your hand, then move coins from palm to fingertips to stack one at a time.  Practice writing and/or typing.  Screw together nuts and bolts, then unfasten.  Use small nuts and bolts  Use pennies initially and then when that gets too easy use dimes.   1. Grip Strengthening (Resistive Putty)  Start with GREEN putty.  WHEN THIS FEELS TOO EASY then switch to blue.  Transition to the blue putty slowly!! If you get any joint pain go back to the green.     Squeeze putty using thumb and all fingers. Repeat _20___ times. Do __2__ sessions per day.   2. Roll putty into tube on table and pinch between index, middle and thumb. Do 5 times. Do 2 sessions per day. ONLY USE THE GREEN - DO NOT USE THE BLUE FOR PINCHING!!!     Copyright  VHI. All rights reserved.

## 2017-08-29 ENCOUNTER — Ambulatory Visit (INDEPENDENT_AMBULATORY_CARE_PROVIDER_SITE_OTHER): Payer: Self-pay | Admitting: Family Medicine

## 2017-08-29 ENCOUNTER — Encounter: Payer: Self-pay | Admitting: Family Medicine

## 2017-08-29 ENCOUNTER — Other Ambulatory Visit: Payer: Self-pay

## 2017-08-29 DIAGNOSIS — I679 Cerebrovascular disease, unspecified: Secondary | ICD-10-CM

## 2017-08-29 DIAGNOSIS — I10 Essential (primary) hypertension: Secondary | ICD-10-CM

## 2017-08-29 DIAGNOSIS — E7800 Pure hypercholesterolemia, unspecified: Secondary | ICD-10-CM

## 2017-08-29 DIAGNOSIS — I6930 Unspecified sequelae of cerebral infarction: Secondary | ICD-10-CM

## 2017-08-29 DIAGNOSIS — I6990 Unspecified sequelae of unspecified cerebrovascular disease: Secondary | ICD-10-CM

## 2017-08-29 DIAGNOSIS — Z609 Problem related to social environment, unspecified: Secondary | ICD-10-CM

## 2017-08-29 DIAGNOSIS — M792 Neuralgia and neuritis, unspecified: Secondary | ICD-10-CM

## 2017-08-29 MED ORDER — GABAPENTIN 300 MG PO CAPS: 300.0000 mg | ORAL_CAPSULE | Freq: Three times a day (TID) | ORAL | 3 refills | Status: DC

## 2017-08-29 MED ORDER — ATORVASTATIN CALCIUM 80 MG PO TABS: 80.0000 mg | ORAL_TABLET | Freq: Every day | ORAL | 3 refills | Status: DC

## 2017-08-29 NOTE — Assessment & Plan Note (Signed)
Current reading OK off all meds.  Will get multiple home readings and decided if needs meds at this time.

## 2017-08-29 NOTE — Patient Instructions (Signed)
I sent in your cholesterol medication and nerve pain medicine. Vital things before you see me in one month. 1. Apply for the orange card 2. Keep taking the aspirin - it prevents strokes and heart attacks. 3. Start the cholesterol medicine 4. Get your blood pressure checked 4-5 times and bring readings in with you.  I need to figure out if you still need blood pressure medicine. 5. Get your mind right and quit smoking.

## 2017-08-29 NOTE — Progress Notes (Signed)
   Subjective:    Patient ID: Antony BlackbirdKeith W Pinkstaff, male    DOB: 1965/08/31, 52 y.o.   MRN: [id number]  HPI Not seen by me x 4 years.  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  Accessed records and he had new acute lacunar infarct and mult old lacunar infarcts.  Lingering left arm weakness, dysarthria and dysphagia.  No insurance.  DCed on multiple meds.  Currently only taking ASA.  Also continues to smoke.  No illicit drugs.   He does have some coughing with meals and difficulty swallowing.  No aspiration pneumonia yet. Carries Dx of hypertension.  BP today is great off meds.  (was on lisinopril.) He complains of neuropathic pain in legs.  Prior Rx of gabapentin helped.  He wants refill.      Review of Systems     Objective:   Physical Exam Speech is only mildly dysarthric. Gait is normal Minor left arm clumsiness/weakness Lungs clear Cardiac RRR without m or g        Assessment & Plan:

## 2017-08-29 NOTE — Assessment & Plan Note (Signed)
Vital that he gets back on high dose statin.

## 2017-08-29 NOTE — Assessment & Plan Note (Signed)
May need PT and ST but await application for orange card.

## 2017-08-29 NOTE — Assessment & Plan Note (Signed)
He has forgone care due to lack of insurance.  Given instructions about apply for orange card.  No labs today.  FU one month.  Hopefully will have orange card by then.

## 2017-08-29 NOTE — Assessment & Plan Note (Addendum)
Needs statin, aspirin, good control of BP and smoking cessation.  These are life critical interventions.

## 2017-10-01 ENCOUNTER — Ambulatory Visit: Payer: Self-pay | Admitting: Family Medicine

## 2017-10-23 ENCOUNTER — Encounter: Payer: Self-pay | Admitting: Family Medicine

## 2017-10-23 ENCOUNTER — Ambulatory Visit (INDEPENDENT_AMBULATORY_CARE_PROVIDER_SITE_OTHER): Payer: Self-pay | Admitting: Family Medicine

## 2017-10-23 ENCOUNTER — Other Ambulatory Visit: Payer: Self-pay

## 2017-10-23 DIAGNOSIS — I10 Essential (primary) hypertension: Secondary | ICD-10-CM

## 2017-10-23 DIAGNOSIS — I679 Cerebrovascular disease, unspecified: Secondary | ICD-10-CM

## 2017-10-23 DIAGNOSIS — Z716 Tobacco abuse counseling: Secondary | ICD-10-CM

## 2017-10-23 DIAGNOSIS — I6990 Unspecified sequelae of unspecified cerebrovascular disease: Secondary | ICD-10-CM

## 2017-10-23 DIAGNOSIS — E7800 Pure hypercholesterolemia, unspecified: Secondary | ICD-10-CM

## 2017-10-23 DIAGNOSIS — M792 Neuralgia and neuritis, unspecified: Secondary | ICD-10-CM

## 2017-10-23 DIAGNOSIS — Z23 Encounter for immunization: Secondary | ICD-10-CM

## 2017-10-23 MED ORDER — ATORVASTATIN CALCIUM 80 MG PO TABS: 80.0000 mg | ORAL_TABLET | Freq: Every day | ORAL | 3 refills | Status: DC

## 2017-10-23 MED ORDER — GABAPENTIN 300 MG PO CAPS: 300.0000 mg | ORAL_CAPSULE | Freq: Three times a day (TID) | ORAL | 3 refills | Status: DC

## 2017-10-23 NOTE — Patient Instructions (Signed)
I am glad your blood pressure is good off medications.  Keep an eye on it.  I will want to treat if it goes above 140/90 You will get a flu shot today. I will call with your cholesterol results. I sent the refills in to the new pharmacy Quit smoking and ... Those two things really increase your risk for another stroke.   I am delighted that you have almost fully recovered.   If things are going well, see in six months.

## 2017-10-24 LAB — LDL CHOLESTEROL, DIRECT: LDL DIRECT: 67 mg/dL (ref 0–99)

## 2017-10-24 NOTE — Assessment & Plan Note (Signed)
Well controled off BP meds.

## 2017-10-24 NOTE — Assessment & Plan Note (Signed)
Minimal residual.  Which is all the more reason to focus on tobacco and drug abuse reduction.

## 2017-10-24 NOTE — Progress Notes (Signed)
   Subjective:    Patient ID: Antony BlackbirdKeith W Mcconahy, male    DOB: [birth year], 52 y.o.   MRN: [id number]  HPI FU CVA: Patient is feeling much better.  No dysarthria or dysphagia.  Left leg and face seem completely normal.  Still has minor left arm weakness, clumsiness.  Taking ASA and statin daily.  Only takes gabapentin prn. Has not had LDL measured since start statin. Goal <70  Home BPs and BP in office at goal off meds.  He will continue to monitor.    Still using tobacco - has cut down.  Has slipped and used cocaine on a couple of occasions.  Drinks 2-3 beer per day.  Initially reluctant to take flu shot but I talked him into it.    Review of Systems     Objective:   Physical Exam  Normal gait and leg strength.  Mild left arm weakness.  No facial weakness. No dysarthria.        Assessment & Plan:

## 2017-10-24 NOTE — Assessment & Plan Note (Signed)
D LDL=67.  [place] [place]

## 2017-10-24 NOTE — Assessment & Plan Note (Signed)
Strongly advised to stop smoking and cocaine.  Puts him at very high risk for second CVA

## 2018-05-12 ENCOUNTER — Encounter: Payer: Self-pay | Admitting: Family Medicine

## 2018-05-12 ENCOUNTER — Telehealth: Payer: Self-pay | Admitting: Family Medicine

## 2018-05-12 NOTE — Telephone Encounter (Signed)
[facility] Phone Triage Note  Patient called reporting symptoms of possible COVID: subjective fever, sore throat, headache that started today. Feels warm but has not taken his temperature. 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 Able to walk around without shortness of breath.  Patient is speaking in complete sentences over the phone without any respiratory distress at all.  Advised we are not testing people with mild symptoms who do not require hospitalization at this time. Patient should stay home for 7 days or 72 hours after symptoms have improved (whichever is longer, at least 7 days).   He does want a work note and I will write one for him, he is going to call back with the fax number to send it to.  Advised to call back if any worsening symptoms. Stressed importance of staying home and avoiding other people. Patient appreciative.  Latrelle Dodrill, MD

## 2019-04-12 ENCOUNTER — Encounter (HOSPITAL_COMMUNITY): Payer: Self-pay | Admitting: *Deleted

## 2019-04-12 ENCOUNTER — Emergency Department (HOSPITAL_COMMUNITY)
Admission: EM | Admit: 2019-04-12 | Discharge: 2019-04-12 | Disposition: A | Discharge status: Home / Self Care | Payer: Self-pay | Attending: Emergency Medicine | Admitting: Emergency Medicine

## 2019-04-12 ENCOUNTER — Other Ambulatory Visit: Payer: Self-pay

## 2019-04-12 DIAGNOSIS — Z5321 Procedure and treatment not carried out due to patient leaving prior to being seen by health care provider: Secondary | ICD-10-CM | POA: Insufficient documentation

## 2019-04-12 DIAGNOSIS — R1310 Dysphagia, unspecified: Secondary | ICD-10-CM | POA: Insufficient documentation

## 2019-04-12 NOTE — ED Triage Notes (Signed)
Pt has been unable to eat and drink over last couple of weeks due to ? esophogeal strictures. He has not had an upper endo  due to no insurance. It is becoming more difficult to eat and drink.

## 2019-04-13 ENCOUNTER — Telehealth: Payer: Self-pay

## 2019-04-13 NOTE — Telephone Encounter (Signed)
Patient's wife, Ralph [name], calls nurse line regarding increased difficulty swallowing. Reports that patient has not been able to eat solid foods in the last three weeks and is also having difficulty drinking ensures. Reports patient had stroke 2 years ago.   States that she took pt to be evaluated in ED yesterday, however, due to long wait times they left after triage.   Per wife, patient does not seem to be having new onset of stroke related symptoms. ED instructions given.    Will discuss with PCP. Please return phone call to wife at [phone]  Please advise   [name], RN

## 2019-04-13 NOTE — Telephone Encounter (Signed)
Called patient's wife.  Ralph [name] had EGD at [facility] in 2018 when admitted for CVA.  Found to have severe erosive esophagitis and I believe had dilation.  Was to return in 2019 for recheck.  Reportedly they would not see due to lack of insurance.  For the past several months, he has had progressive dysphagia.  First was only solid foods, which forced him to survive on Boost.  Now even difficulty in swallowing liquids.  Cannot keep boost down.  Reportedly has a 20 lb wt loss - wife states he weighs only 135 lbs.    I am concerned not only about the dysphagia (likely again esophagitis with stricture or Schotski's ring - isolated dysphagia makes recurrent stroke unlikely) but also about malnutrition and dehydration.  Instructed him to return to ER.  This time stay (Yesterday eloped due to long wait time.)  Will likely require hospitalization for dehydration and EGD.

## 2019-04-28 ENCOUNTER — Other Ambulatory Visit: Payer: Self-pay

## 2019-04-28 ENCOUNTER — Inpatient Hospital Stay (HOSPITAL_COMMUNITY)
Admission: EM | Admit: 2019-04-28 | Discharge: 2019-05-02 | DRG: 380 | Disposition: A | Discharge status: Home / Self Care | Payer: Self-pay | Attending: Family Medicine | Admitting: Family Medicine

## 2019-04-28 ENCOUNTER — Encounter (HOSPITAL_COMMUNITY): Payer: Self-pay | Admitting: Emergency Medicine

## 2019-04-28 ENCOUNTER — Emergency Department (HOSPITAL_COMMUNITY): Payer: Self-pay

## 2019-04-28 DIAGNOSIS — I252 Old myocardial infarction: Secondary | ICD-10-CM

## 2019-04-28 DIAGNOSIS — E7800 Pure hypercholesterolemia, unspecified: Secondary | ICD-10-CM

## 2019-04-28 DIAGNOSIS — Z716 Tobacco abuse counseling: Secondary | ICD-10-CM

## 2019-04-28 DIAGNOSIS — Z20822 Contact with and (suspected) exposure to covid-19: Secondary | ICD-10-CM | POA: Diagnosis present

## 2019-04-28 DIAGNOSIS — E785 Hyperlipidemia, unspecified: Secondary | ICD-10-CM | POA: Diagnosis present

## 2019-04-28 DIAGNOSIS — R64 Cachexia: Secondary | ICD-10-CM | POA: Diagnosis present

## 2019-04-28 DIAGNOSIS — K2289 Other specified disease of esophagus: Secondary | ICD-10-CM

## 2019-04-28 DIAGNOSIS — K228 Other specified diseases of esophagus: Secondary | ICD-10-CM

## 2019-04-28 DIAGNOSIS — I69354 Hemiplegia and hemiparesis following cerebral infarction affecting left non-dominant side: Secondary | ICD-10-CM

## 2019-04-28 DIAGNOSIS — F17210 Nicotine dependence, cigarettes, uncomplicated: Secondary | ICD-10-CM | POA: Diagnosis present

## 2019-04-28 DIAGNOSIS — E43 Unspecified severe protein-calorie malnutrition: Secondary | ICD-10-CM | POA: Diagnosis present

## 2019-04-28 DIAGNOSIS — Z7982 Long term (current) use of aspirin: Secondary | ICD-10-CM

## 2019-04-28 DIAGNOSIS — Z598 Other problems related to housing and economic circumstances: Secondary | ICD-10-CM

## 2019-04-28 DIAGNOSIS — I679 Cerebrovascular disease, unspecified: Secondary | ICD-10-CM

## 2019-04-28 DIAGNOSIS — I739 Peripheral vascular disease, unspecified: Secondary | ICD-10-CM | POA: Diagnosis present

## 2019-04-28 DIAGNOSIS — E871 Hypo-osmolality and hyponatremia: Secondary | ICD-10-CM | POA: Diagnosis present

## 2019-04-28 DIAGNOSIS — I2510 Atherosclerotic heart disease of native coronary artery without angina pectoris: Secondary | ICD-10-CM | POA: Diagnosis present

## 2019-04-28 DIAGNOSIS — K2211 Ulcer of esophagus with bleeding: Principal | ICD-10-CM | POA: Diagnosis present

## 2019-04-28 DIAGNOSIS — Z681 Body mass index (BMI) 19 or less, adult: Secondary | ICD-10-CM

## 2019-04-28 DIAGNOSIS — R1310 Dysphagia, unspecified: Secondary | ICD-10-CM

## 2019-04-28 DIAGNOSIS — F17200 Nicotine dependence, unspecified, uncomplicated: Secondary | ICD-10-CM | POA: Insufficient documentation

## 2019-04-28 DIAGNOSIS — Z8249 Family history of ischemic heart disease and other diseases of the circulatory system: Secondary | ICD-10-CM

## 2019-04-28 DIAGNOSIS — R634 Abnormal weight loss: Secondary | ICD-10-CM

## 2019-04-28 DIAGNOSIS — Z79899 Other long term (current) drug therapy: Secondary | ICD-10-CM

## 2019-04-28 DIAGNOSIS — R590 Localized enlarged lymph nodes: Secondary | ICD-10-CM

## 2019-04-28 DIAGNOSIS — R636 Underweight: Secondary | ICD-10-CM | POA: Diagnosis present

## 2019-04-28 DIAGNOSIS — Z5989 Other problems related to housing and economic circumstances: Secondary | ICD-10-CM

## 2019-04-28 DIAGNOSIS — I10 Essential (primary) hypertension: Secondary | ICD-10-CM | POA: Diagnosis present

## 2019-04-28 DIAGNOSIS — Z823 Family history of stroke: Secondary | ICD-10-CM

## 2019-04-28 DIAGNOSIS — E46 Unspecified protein-calorie malnutrition: Secondary | ICD-10-CM | POA: Insufficient documentation

## 2019-04-28 LAB — CBC
HCT: 47.6 % (ref 39.0–52.0)
Hemoglobin: 16.2 g/dL (ref 13.0–17.0)
MCH: 32.5 pg (ref 26.0–34.0)
MCHC: 34 g/dL (ref 30.0–36.0)
MCV: 95.4 fL (ref 80.0–100.0)
Platelets: 300 10*3/uL (ref 150–400)
RBC: 4.99 MIL/uL (ref 4.22–5.81)
RDW: 12.9 % (ref 11.5–15.5)
WBC: 10.1 10*3/uL (ref 4.0–10.5)
nRBC: 0 % (ref 0.0–0.2)

## 2019-04-28 LAB — COMPREHENSIVE METABOLIC PANEL
ALT: 24 U/L (ref 0–44)
AST: 34 U/L (ref 15–41)
Albumin: 3.4 g/dL — ABNORMAL LOW (ref 3.5–5.0)
Alkaline Phosphatase: 76 U/L (ref 38–126)
Anion gap: 12 (ref 5–15)
BUN: 12 mg/dL (ref 6–20)
CO2: 27 mmol/L (ref 22–32)
Calcium: 9.3 mg/dL (ref 8.9–10.3)
Chloride: 92 mmol/L — ABNORMAL LOW (ref 98–111)
Creatinine, Ser: 0.8 mg/dL (ref 0.61–1.24)
GFR calc Af Amer: >60 mL/min (ref >60)
GFR calc non Af Amer: >60 mL/min (ref >60)
Glucose, Bld: 165 mg/dL — ABNORMAL HIGH (ref 70–99)
Potassium: 4 mmol/L (ref 3.5–5.1)
Sodium: 131 mmol/L — ABNORMAL LOW (ref 135–145)
Total Bilirubin: 0.5 mg/dL (ref 0.3–1.2)
Total Protein: 6.5 g/dL (ref 6.5–8.1)

## 2019-04-28 LAB — LIPASE, BLOOD: Lipase: 25 U/L (ref 11–51)

## 2019-04-28 MED ORDER — NICOTINE 14 MG/24HR TD PT24
14.0000 mg | MEDICATED_PATCH | Freq: Every day | TRANSDERMAL | Status: DC
  Administered 2019-04-29 – 2019-04-30 (×2): 14 mg via TRANSDERMAL
  Filled 2019-04-28 (×3): qty 1

## 2019-04-28 MED ORDER — PANTOPRAZOLE SODIUM 40 MG IV SOLR
40.0000 mg | Freq: Two times a day (BID) | INTRAVENOUS | Status: DC
  Administered 2019-04-28 – 2019-05-02 (×8): 40 mg via INTRAVENOUS
  Filled 2019-04-28 (×10): qty 40

## 2019-04-28 MED ORDER — SODIUM CHLORIDE 0.9% FLUSH
3.0000 mL | Freq: Once | INTRAVENOUS | Status: AC
  Administered 2019-04-28: 21:00:00 3 mL via INTRAVENOUS

## 2019-04-28 MED ORDER — SODIUM CHLORIDE 0.9 % IV SOLN: INTRAVENOUS | Status: DC

## 2019-04-28 MED ORDER — ENOXAPARIN SODIUM 40 MG/0.4ML ~~LOC~~ SOLN
40.0000 mg | SUBCUTANEOUS | Status: DC
  Administered 2019-04-28 – 2019-05-01 (×4): 40 mg via SUBCUTANEOUS
  Filled 2019-04-28 (×5): qty 0.4

## 2019-04-28 MED ORDER — LORAZEPAM 2 MG/ML IJ SOLN
0.5000 mg | Freq: Once | INTRAMUSCULAR | Status: AC
  Administered 2019-04-28: 0.5 mg via INTRAVENOUS
  Filled 2019-04-28: qty 1

## 2019-04-28 NOTE — ED Provider Notes (Signed)
[facility] EMERGENCY DEPARTMENT Provider Note   CSN: [id number] Arrival date & time: 04/28/19  1510     History Chief Complaint  Patient presents with  . Dysphagia    Ralph [name] is a 54 y.o. male.  Patient with history of stroke in 2018 with some residual left-sided weakness, history of esophagitis lost to follow-up --presents the emergency department with complaint of dysphagia, regurgitation, and weight loss.  Patient states that since his stroke he typically weighs 160 to 170 pounds.  He states that over the past couple of months his weight has dropped and today presents to the emergency department weighing 129 pounds.  He has been unable to follow-up with gastroenterology due to social issues.  He reports that when he eats or drinks he can swallow but then shortly after develops a very severe pain in his chest.  Shortly after he regurgitates what he ate or drank.  It sounds like he might be keeping down small amounts of fluid, but not really any solids.  Denies diarrhea, shortness of breath.  Patient's wife called PCP, Dr. Leveda Anna, back on 3/8.  It was recommended that he come into the ED for further evaluation and possible admission at that time, but he never presented.    From [facility] note during admission for CVA 12/2016:   55 year old male who presented with left-sided weakness and numbness was found to have acute stroke. He has been complaining of dysphagia and weight loss. Barium swallow showed severe distal esophageal stricture, possible peptic stricture versus mass. Concern for malignancy. EGD 12/13/16 with Grade D erosive esophagitis with stricture at distal esophagus- not dilated due to severe esophagitis; repeat EGD in 3 months.        Past Medical History:  Diagnosis Date  . Coronary artery disease   . Myocardial infarction (HCC)   . Stroke [facility])     Patient Active Problem List   Diagnosis Date Noted  . Neuropathic pain  08/29/2017  . High risk social situation 08/29/2017  . 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  . Pure hypercholesterolemia 11/26/2012  . Cerebrovascular disease 11/25/2012  . Peripheral vascular disease, unspecified (HCC) 11/25/2012  . Coronary artery disease 11/25/2012  . Tobacco abuse counseling 11/25/2012  . Essential hypertension, benign 11/25/2012    Past Surgical History:  Procedure Laterality Date  . APPENDECTOMY    . SHOULDER SURGERY         History reviewed. No pertinent family history.  Social History   Tobacco Use  . Smoking status: Current Every Day Smoker    Packs/day: 1.50    Years: 20.00    Pack years: 30.00    Types: Cigarettes  . Smokeless tobacco: Never Used  Substance Use Topics  . Alcohol use: Yes    Alcohol/week: 1.0 standard drinks    Types: 1 Standard drinks or equivalent per week  . Drug use: Yes    Types: Cocaine    Home Medications Prior to Admission medications   Medication Sig Start Date End Date Taking? Authorizing Provider  aspirin EC 81 MG tablet Take 81 mg by mouth daily.    [provider]  atorvastatin (LIPITOR) 80 MG tablet Take 1 tablet (80 mg total) by mouth daily. 10/23/17   Moses Manners, MD  gabapentin (NEURONTIN) 300 MG capsule Take 1 capsule (300 mg total) by mouth 3 (three) times daily. 10/23/17   Moses Manners, MD    Allergies    Patient has  no known allergies.  Review of Systems   Review of Systems  Constitutional: Negative for fever.  HENT: Positive for trouble swallowing. Negative for rhinorrhea and sore throat.   Eyes: Negative for redness.  Respiratory: Negative for cough.   Cardiovascular: Positive for chest pain.  Gastrointestinal: Positive for vomiting (regurgitation). Negative for abdominal pain, diarrhea and nausea.  Genitourinary: Negative for dysuria.  Musculoskeletal: Negative for myalgias.  Skin: Negative for rash.  Neurological: Negative for headaches.     Physical Exam Updated Vital Signs BP (!) 126/92 (BP Location: Left Arm)   Pulse 80   Temp 97.9 F (36.6 C) (Oral)   Resp 18   Ht 5\' 9"  (1.753 m)   Wt 59 kg   SpO2 99%   BMI 19.20 kg/m   Physical Exam Vitals and nursing note reviewed.  Constitutional:      Appearance: He is underweight.  HENT:     Head: Normocephalic and atraumatic.  Eyes:     General:        Right eye: No discharge.        Left eye: No discharge.     Conjunctiva/sclera: Conjunctivae normal.  Cardiovascular:     Rate and Rhythm: Normal rate and regular rhythm.     Heart sounds: Normal heart sounds.  Pulmonary:     Effort: Pulmonary effort is normal.     Breath sounds: Normal breath sounds.  Abdominal:     Palpations: Abdomen is soft.     Tenderness: There is no abdominal tenderness. There is no guarding or rebound.  Musculoskeletal:     Cervical back: Normal range of motion and neck supple.  Skin:    General: Skin is warm and dry.  Neurological:     Mental Status: He is alert.     ED Results / Procedures / Treatments   Labs (all labs ordered are listed, but only abnormal results are displayed) Labs Reviewed  COMPREHENSIVE METABOLIC PANEL - Abnormal; Notable for the following components:      Result Value   Sodium 131 (*)    Chloride 92 (*)    Glucose, Bld 165 (*)    Albumin 3.4 (*)    All other components within normal limits  SARS CORONAVIRUS 2 (TAT 6-24 HRS)  LIPASE, BLOOD  CBC    EKG None  Radiology DG Chest 2 View  Result Date: 04/28/2019 CLINICAL DATA:  Dysphagia, chest pain EXAM: CHEST - 2 VIEW COMPARISON:  11/07/2005 FINDINGS: Heart and mediastinal contours are within normal limits. No focal opacities or effusions. No acute bony abnormality. IMPRESSION: No active cardiopulmonary disease. Electronically Signed   By: 01/07/2006 M.D.   On: 04/28/2019 19:35    Procedures Procedures (including critical care time)  Medications Ordered in ED Medications  sodium chloride  flush (NS) 0.9 % injection 3 mL (has no administration in time range)  0.9 %  sodium chloride infusion (has no administration in time range)  LORazepam (ATIVAN) injection 0.5 mg (has no administration in time range)    ED Course  I have reviewed the triage vital signs and the nursing notes.  Pertinent labs & imaging results that were available during my care of the patient were reviewed by me and considered in my medical decision making (see chart for details).  Patient seen and examined. Work-up initiated. Reviewed notes from admission at [facility] in 2018.  At that time, there is concern for esophageal stricture versus mass.  Patient was supposed to follow-up for repeat EGD  in 3 months, however this is not happened.  He presents today with severe dysphagia, regurgitation, extreme weight loss.  I will discuss the case with family practice.  Vital signs reviewed and are as follows: BP (!) 126/92 (BP Location: Left Arm)   Pulse 80   Temp 97.9 F (36.6 C) (Oral)   Resp 18   Ht 5\' 9"  (1.753 m)   Wt 59 kg   SpO2 99%   BMI 19.20 kg/m   I discussed case with family practice resident as well as Dr. Alessandra Bevels of [facility].  Given patient's current symptoms which are concerning, his history of not following up appropriately, he will be best served at this point my admission to the hospital.  Gastroenterology will consult on patient in the morning and help formulate a plan.  Family practice agrees to admit patient.  Covid sent and is pending.  Patient updated on results.  He is in agreement.  Ativan ordered for anxiety related symptoms.     MDM Rules/Calculators/A&P                      Admit for further evaluation of dysphagia and extreme weight loss.    Final Clinical Impression(s) / ED Diagnoses Final diagnoses:  Dysphagia, unspecified type  Weight loss  Hyponatremia    Rx / DC Orders ED Discharge Orders    None       Carlisle Cater, PA-C 04/28/19 2032    Veryl Speak,  MD 04/28/19 2352

## 2019-04-28 NOTE — H&P (Addendum)
Family Medicine Teaching [facility] Admission History and Physical Service Pager: [phone]  Patient name: Ralph [name] Medical record number: [id number] Date of birth: [birth year] Age: 54 y.o. Gender: male  Primary Care Provider: Moses Manners, MD Consultants: GI Code Status: Full, open to receiving blood Preferred Emergency Contact: Garner Gavel [phone]  Chief Complaint: Dysphagia, regurgitation   Assessment and Plan: Ralph [name] is a 54 y.o. male presenting with dysphagia, regurgitation and weight loss. PMH is significant for Tobacco Use Disorder, Hx CVA, CAD, HTN, and HLD.    Dysphagia, weight loss with concern for GI malignancy  Mr Ralph [name] is a 54 yr old who presents to the ER with worsening dysphagia, regurgitation of food and liquid and weight loss. Patient had EGD in 12/13/16 which showed severe distal esophageal stricture, possible peptic stricture versus mass and there was a concern for malignancy. He also had Grade D erosive esophagitis with stricture at distal of esophagus, recommended repeat EGD in 3 months. Was unable to follow-up with GI due to social issues and lack of insurance. Patient reports that his dysphagia, which started as difficulty swallowing foods has now progressed to difficulty and pain swallowing liquids. Patient also reports 2 episodes of hematemesis (bright red blood) over the last couple of days with associated chest pain that runs across the width of his chest and to his back. Differential includes GI malignancy ie Esophageal Ca as primary differential given severe weight loss 30-40lb progressively worsening with dysphagia with solid and now liquids, odonophagia and also hematemesis. Considered gastritis/PUD however would not expect such severe weight loss and odonophagia. Hb 14 and MCV 95 showing no signs of anemia. Also considered ACS and dissection as pt has chest pain and has risk factors for CAD such as long standing hx of HTN and smoking.  EKG shows SR without ischemic changes.  Troponins are pending however low suspicion for cardiac cause of chest pain. CXR without widening mediastinum. Pt is hemodynamically stable. -Admit to MedSurg, Attending Dr Lum Babe -GI following Dr Levora Angel, appreciate recommendations, possible EGD tomorrow 03/24 -Vitals per floor routine -Up with assistance -N.p.o. from midnight, then can have food as tolerated -N.saline 100cc/hr  -IV Protonix  -Follow up COVID -Follow-up EKG -Follow-up up troponins -AM CBC -AM CMP -Follow-up INR -PT/OT evaluation  -Consider CT abdomen, pelvis for investigation of GI malignancy.  Weight loss, unintentional Patient usually weighs 160-170 lb, now weighs 130lb. Appears cachetic on examination.  -Consider nutrition consult -Ensure shakes post EGD -Add Ensure to Diet to maintain nutrition -Consider Dietician consult  Hyponatremia Na 131 on admission, no previous labs on file to determine baseline. Most likely due to poor PO intake.  -Continue to monitor with BMP    HTN SBP 126-151, DBP 92-104. Not on known anithypertensive -Monitor BPs  Hyperlipidemia Lipid panel 2014: Cholesterol 194, HDL 50, LDL 125, triglycerides 96, VLDL 19  Home meds: prescribed Atorvastatin 80mg . Patient has not been taking due to cost of medications and GI symptoms. Patient has personal Hx of CVA in 2018, strong family history of MI/CVA.  -Hold atorvastatin as n.p.o, restart as soon as able to tolerate PO  Neuropathic pain Home meds: Gabapentin 300 mg 3 times daily, patient has not been taking due to cost of medications and GI symptoms  -Hold gabapentin as n.p.o.  CVA Stroke in 2018, has left-sided residual weakness Home meds: aspirin 81mg  and atorvastatin 80mg , patient has not been taking due to cost of medications and GI symptoms  -Hold aspirin 81mg   and Atorvastatin  Tobacco Use disorder Patient smokes 1PPD since age 49.  -14 mg Nicotine patch   FEN/GI: NPO, Protonix   Prophylaxis: Lovenox   Disposition: Med-surg  History of Present Illness:  Ralph [name] is a 54 y.o. male presenting with dysphagia, chest pain and weight loss.  Pt reports that over the past month he has noticed swallowing difficulties. When he was eating a cheese burger, he felt it got stuck in his food pipe, it would come back up and hurt a lot. Initially he noticed this with solid foods and now has progressed to difficult swallowing liquids. Endorses chest pain associated with swallowing food/liquid. This morning he drank coffee and the same thing happened. Endorses 2 x episodes of hematemesis over the last few days and showed [place] photos of the emesis: bright red color with specks of dark clots.   Did have a EGD in 2018 and was advised to follow up with GI for repeat EGD 3 months later but did not follow up due to lack of insurance. Used to weight approx 200lb and weight has gradualy decreased. After his stroke he was 160-170lb. He is now 132lb. He says he feels hungry but cannot keep food or liquids now.   Denies fevers, cough, SOB, palpitations, dizziness, abdominal pain, dysuria, frequency, hematuria, rectal bleeding or melena. Denies covid contacts.    Review Of Systems: Per HPI  Patient Active Problem List   Diagnosis Date Noted  . Neuropathic pain 08/29/2017  . High risk social situation 08/29/2017  . 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  . Pure hypercholesterolemia 11/26/2012  . Cerebrovascular disease 11/25/2012  . Peripheral vascular disease, unspecified (HCC) 11/25/2012  . Coronary artery disease 11/25/2012  . Tobacco abuse counseling 11/25/2012  . Essential hypertension, benign 11/25/2012    Past Medical History: Past Medical History:  Diagnosis Date  . Coronary artery disease   . Myocardial infarction (HCC)   . Stroke [facility])     Past Surgical History: Past Surgical History:  Procedure Laterality Date  . APPENDECTOMY    . SHOULDER SURGERY       Social History: Social History   Tobacco Use  . Smoking status: Current Every Day Smoker    Packs/day: 1.50    Years: 20.00    Pack years: 30.00    Types: Cigarettes  . Smokeless tobacco: Never Used  Substance Use Topics  . Alcohol use: Yes    Alcohol/week: 1.0 standard drinks    Types: 1 Standard drinks or equivalent per week  . Drug use: Yes    Types: Cocaine   Additional social history:  Please also refer to relevant sections of EMR.  Family History: History reviewed. No pertinent family history. (If not completed, MUST add something in)  Allergies and Medications: No Known Allergies No current facility-administered medications on file prior to encounter.   Current Outpatient Medications on File Prior to Encounter  Medication Sig Dispense Refill  . aspirin EC 81 MG tablet Take 81 mg by mouth daily.    Marland Kitchen atorvastatin (LIPITOR) 80 MG tablet Take 1 tablet (80 mg total) by mouth daily. 90 tablet 3  . gabapentin (NEURONTIN) 300 MG capsule Take 1 capsule (300 mg total) by mouth 3 (three) times daily. 90 capsule 3    Objective: BP (!) 151/104 (BP Location: Right Arm)   Pulse 64   Temp 97.9 F (36.6 C) (Oral)   Resp 16   Ht 5\' 9"  (1.753 m)   Wt 59 kg  SpO2 99%   BMI 19.20 kg/m   Exam: General: Cachetic appearing 54 yr old male, appears older than stated age, no acute distress Eyes: normal EOM, no scleral icterus  ENTM: mild pharyngeal edema, poor dentition, no exudates, no lymphadenopathy, no thyromegaly  Neck: supple, normal ROM  Cardiovascular: S1 and S2 present, RRR, no rubs or gallops  Respiratory: CTAB, normal WOB Gastrointestinal: abdomen soft non tender, bowel sounds present  MSK: moving all 4 limbs equally  Derm: warm and dry Neuro: cranial nerves grossly in tact Psych: normal mood, normal affect  Labs and Imaging: CBC BMET  Recent Labs  Lab 04/28/19 1603  WBC 10.1  HGB 16.2  HCT 47.6  PLT 300   Recent Labs  Lab 04/28/19 1603  NA 131*   K 4.0  CL 92*  CO2 27  BUN 12  CREATININE 0.80  GLUCOSE 165*  CALCIUM 9.3     EKG: sinus rhythm, R atrial enlargement   Lattie Haw, MD 04/28/2019, 8:21 PM PGY-1, [facility] Intern pager: [phone], text pages welcome   FPTS Upper-Level Resident Addendum   I have independently interviewed and examined the patient. I have discussed the above with the original author and agree with their documentation. My edits for correction/addition/clarification are in blue. Please see also any attending notes.    Milus Banister, DO PGY-2, [facility] 04/29/2019 1:39 AM  FPTS Service pager: [phone] (text pages welcome through [facility])

## 2019-04-28 NOTE — ED Notes (Signed)
Pt transported to XR.  

## 2019-04-28 NOTE — ED Triage Notes (Signed)
Patient arrives to ED with complaints of dysphagia for the last couple of months that continues to worsen. Patient states he is now unable to keep fluids down. Patient was admitted in 2018 and was dx with erosive esophagitis. Patient has not followed up with GI since. Patient has lost over 20 pounds in the last couple of weeks. Patient here for EGD.

## 2019-04-29 ENCOUNTER — Encounter (HOSPITAL_COMMUNITY): Payer: Self-pay | Admitting: Family Medicine

## 2019-04-29 ENCOUNTER — Inpatient Hospital Stay (HOSPITAL_COMMUNITY): Payer: Self-pay | Admitting: Certified Registered"

## 2019-04-29 ENCOUNTER — Encounter (HOSPITAL_COMMUNITY)
Admission: EM | Disposition: A | Discharge status: Home / Self Care | Payer: Self-pay | Source: Home / Self Care | Attending: Family Medicine

## 2019-04-29 ENCOUNTER — Other Ambulatory Visit: Payer: Self-pay

## 2019-04-29 DIAGNOSIS — E871 Hypo-osmolality and hyponatremia: Secondary | ICD-10-CM

## 2019-04-29 DIAGNOSIS — R634 Abnormal weight loss: Secondary | ICD-10-CM

## 2019-04-29 DIAGNOSIS — R1310 Dysphagia, unspecified: Secondary | ICD-10-CM

## 2019-04-29 HISTORY — PX: ESOPHAGOGASTRODUODENOSCOPY (EGD) WITH PROPOFOL: SHX5813

## 2019-04-29 HISTORY — PX: BIOPSY: SHX5522

## 2019-04-29 LAB — COMPREHENSIVE METABOLIC PANEL
ALT: 21 U/L (ref 0–44)
AST: 24 U/L (ref 15–41)
Albumin: 3.1 g/dL — ABNORMAL LOW (ref 3.5–5.0)
Alkaline Phosphatase: 61 U/L (ref 38–126)
Anion gap: 8 (ref 5–15)
BUN: 7 mg/dL (ref 6–20)
CO2: 26 mmol/L (ref 22–32)
Calcium: 8.9 mg/dL (ref 8.9–10.3)
Chloride: 96 mmol/L — ABNORMAL LOW (ref 98–111)
Creatinine, Ser: 0.62 mg/dL (ref 0.61–1.24)
GFR calc Af Amer: >60 mL/min (ref >60)
GFR calc non Af Amer: >60 mL/min (ref >60)
Glucose, Bld: 100 mg/dL — ABNORMAL HIGH (ref 70–99)
Potassium: 3.8 mmol/L (ref 3.5–5.1)
Sodium: 130 mmol/L — ABNORMAL LOW (ref 135–145)
Total Bilirubin: 0.9 mg/dL (ref 0.3–1.2)
Total Protein: 5.8 g/dL — ABNORMAL LOW (ref 6.5–8.1)

## 2019-04-29 LAB — CBC
HCT: 44.3 % (ref 39.0–52.0)
Hemoglobin: 14.8 g/dL (ref 13.0–17.0)
MCH: 32.5 pg (ref 26.0–34.0)
MCHC: 33.4 g/dL (ref 30.0–36.0)
MCV: 97.4 fL (ref 80.0–100.0)
Platelets: 252 10*3/uL (ref 150–400)
RBC: 4.55 MIL/uL (ref 4.22–5.81)
RDW: 13.1 % (ref 11.5–15.5)
WBC: 8.3 10*3/uL (ref 4.0–10.5)
nRBC: 0 % (ref 0.0–0.2)

## 2019-04-29 LAB — HEMOGLOBIN A1C
Hgb A1c MFr Bld: 4.9 % (ref 4.8–5.6)
Mean Plasma Glucose: 93.93 mg/dL

## 2019-04-29 LAB — MAGNESIUM: Magnesium: 2 mg/dL (ref 1.7–2.4)

## 2019-04-29 LAB — TROPONIN I (HIGH SENSITIVITY)
Troponin I (High Sensitivity): <2 ng/L (ref <18)
Troponin I (High Sensitivity): <2 ng/L (ref <18)

## 2019-04-29 LAB — HIV ANTIBODY (ROUTINE TESTING W REFLEX): HIV Screen 4th Generation wRfx: NONREACTIVE

## 2019-04-29 LAB — SARS CORONAVIRUS 2 (TAT 6-24 HRS): SARS Coronavirus 2: NEGATIVE

## 2019-04-29 LAB — PROTIME-INR
INR: 0.9 (ref 0.8–1.2)
Prothrombin Time: 12.3 seconds (ref 11.4–15.2)

## 2019-04-29 SURGERY — ESOPHAGOGASTRODUODENOSCOPY (EGD) WITH PROPOFOL
Anesthesia: Monitor Anesthesia Care

## 2019-04-29 MED ORDER — HYDROMORPHONE HCL 1 MG/ML IJ SOLN
0.5000 mg | INTRAMUSCULAR | Status: DC | PRN
  Administered 2019-04-29 (×3): 0.5 mg via INTRAVENOUS
  Filled 2019-04-29 (×3): qty 1

## 2019-04-29 MED ORDER — ACETAMINOPHEN 325 MG PO TABS: 650.0000 mg | ORAL_TABLET | Freq: Four times a day (QID) | ORAL | Status: DC | PRN

## 2019-04-29 MED ORDER — PROPOFOL 10 MG/ML IV BOLUS
INTRAVENOUS | Status: DC | PRN
  Administered 2019-04-29: 50 mg via INTRAVENOUS

## 2019-04-29 MED ORDER — GLYCOPYRROLATE 0.2 MG/ML IJ SOLN
INTRAMUSCULAR | Status: DC | PRN
  Administered 2019-04-29: .2 mg via INTRAVENOUS

## 2019-04-29 MED ORDER — ACETAMINOPHEN 650 MG RE SUPP: 650.0000 mg | RECTAL | Status: DC | PRN

## 2019-04-29 MED ORDER — EPHEDRINE SULFATE 50 MG/ML IJ SOLN
INTRAMUSCULAR | Status: DC | PRN
  Administered 2019-04-29: 5 mg via INTRAVENOUS

## 2019-04-29 MED ORDER — HYDROXYZINE HCL 50 MG/ML IM SOLN
15.0000 mg | Freq: Four times a day (QID) | INTRAMUSCULAR | Status: DC | PRN
  Filled 2019-04-29: qty 0.3

## 2019-04-29 MED ORDER — ACETAMINOPHEN 10 MG/ML IV SOLN
500.0000 mg | Freq: Four times a day (QID) | INTRAVENOUS | Status: DC
  Administered 2019-04-29 (×2): 500 mg via INTRAVENOUS
  Filled 2019-04-29: qty 50

## 2019-04-29 MED ORDER — LORAZEPAM 2 MG/ML IJ SOLN
0.5000 mg | Freq: Once | INTRAMUSCULAR | Status: AC
  Administered 2019-04-29: 0.5 mg via INTRAVENOUS
  Filled 2019-04-29: qty 1

## 2019-04-29 MED ORDER — HYDROMORPHONE HCL 1 MG/ML IJ SOLN
0.5000 mg | INTRAMUSCULAR | Status: DC | PRN
  Administered 2019-04-29 – 2019-04-30 (×3): 1 mg via INTRAVENOUS
  Filled 2019-04-29 (×3): qty 1

## 2019-04-29 MED ORDER — ACETAMINOPHEN 10 MG/ML IV SOLN
1000.0000 mg | Freq: Four times a day (QID) | INTRAVENOUS | Status: DC
  Administered 2019-04-30 (×2): 1000 mg via INTRAVENOUS
  Filled 2019-04-29 (×3): qty 100

## 2019-04-29 MED ORDER — PROPOFOL 500 MG/50ML IV EMUL
INTRAVENOUS | Status: DC | PRN
  Administered 2019-04-29: 150 ug/kg/min via INTRAVENOUS

## 2019-04-29 SURGICAL SUPPLY — 15 items

## 2019-04-29 NOTE — ED Notes (Signed)
Patient requesting pain medication. MD paged.

## 2019-04-29 NOTE — Progress Notes (Signed)
PT Cancellation Note  Patient Details Name: Ralph [name] MRN: [id number] DOB: 02/02/1966   Cancelled Treatment:    Reason Eval/Treat Not Completed: Patient at procedure or test/unavailable Pt currently in endoscopy. Will follow up as schedule allows.   Cindee Salt, DPT  Acute Rehabilitation Services  Pager: [phone] Office: [phone]  Ralph [name] 04/29/2019, 11:32 AM

## 2019-04-29 NOTE — ED Notes (Signed)
Pain med given 

## 2019-04-29 NOTE — ED Notes (Signed)
Care endorsed to Hilary, RN 

## 2019-04-29 NOTE — H&P (View-Only) (Signed)
Referring Provider: Dr. Rondel Oh (ED) Primary Care Physician:  Zenia Resides, MD Primary Gastroenterologist:  Althia Forts  Reason for Consultation:  dysphagia  HPI: Ralph [name] is a 54 y.o. male with history of CVA (2018), MI (~8 years ago) and erosive esophagitis (2018) presenting with dysphagia.  He reports he has had dysphagia since 2018, but it has worsened over the last month.  Initially, he was able to swallow liquids, but over the past few days, he starting having dysphagia with liquids.  He reports that both solid and liquids are regurgitated back up shortly after swallowing.  He also notes small amount of red-tinged emesis over the past day.  He states he has lost approximately 30 lbs over the last month.  He endorses feeling hungry but is unable to oral intake.  He denies heartburn or abdominal pain but reports discomfort in his chest with swallowing. He denies any black or bloody bowel movements though states he only has 1 bowel movement a week due to lack of PO intake.  He denies family history of gastrointestinal malignancies.  When he was able to consume liquids, he would drink 40z beer daily.  He denies illicit drug use.  He is not currently on blood thinners but received 40mg  SQ lovenox in the ED yesterday.  Last EGD was November 2018 and showed severe grade D erosive esophagitis with stricture at distal esophagus.  Dilation was not performed and no biopsies were taken.  Barium swallow preceding the EGD showed severe distal esophageal stricture concerning for mass.  Patient was to follow up in 3 months for repeat EGD but did not due so due to lack of insurance.   Past Medical History:  Diagnosis Date  . Coronary artery disease   . Myocardial infarction ([place])   . Stroke [facility])     Past Surgical History:  Procedure Laterality Date  . APPENDECTOMY    . SHOULDER SURGERY      Prior to Admission medications   Medication Sig Start Date End Date Taking? Authorizing Provider   atorvastatin (LIPITOR) 80 MG tablet Take 1 tablet (80 mg total) by mouth daily. Patient not taking: Reported on 04/28/2019 10/23/17   Zenia Resides, MD  gabapentin (NEURONTIN) 300 MG capsule Take 1 capsule (300 mg total) by mouth 3 (three) times daily. Patient not taking: Reported on 04/28/2019 10/23/17   Zenia Resides, MD    Scheduled Meds: . enoxaparin (LOVENOX) injection  40 mg Subcutaneous Q24H  . nicotine  14 mg Transdermal Daily  . pantoprazole (PROTONIX) IV  40 mg Intravenous Q12H   Continuous Infusions: . sodium chloride 100 mL/hr at 04/29/19 0011  . acetaminophen Stopped (04/29/19 0559)   PRN Meds:.HYDROmorphone (DILAUDID) injection  Allergies as of 04/28/2019  . (No Known Allergies)    History reviewed. No pertinent family history.  Social History   Socioeconomic History  . Marital status: Single    Spouse name: Not on file  . Number of children: Not on file  . Years of education: Not on file  . Highest education level: Not on file  Occupational History  . Not on file  Tobacco Use  . Smoking status: Current Every Day Smoker    Packs/day: 1.50    Years: 20.00    Pack years: 30.00    Types: Cigarettes  . Smokeless tobacco: Never Used  Substance and Sexual Activity  . Alcohol use: Yes    Alcohol/week: 1.0 standard drinks    Types: 1 Standard drinks or  equivalent per week  . Drug use: Yes    Types: Cocaine  . Sexual activity: Yes    Comment: with wife  Other Topics Concern  . Not on file  Social History Narrative  . Not on file   Social Determinants of Health   Financial Resource Strain:   . Difficulty of Paying Living Expenses:   Food Insecurity:   . Worried About Programme researcher, broadcasting/film/video in the Last Year:   . Barista in the Last Year:   Transportation Needs:   . Freight forwarder (Medical):   Marland Kitchen Lack of Transportation (Non-Medical):   Physical Activity:   . Days of Exercise per Week:   . Minutes of Exercise per Session:   Stress:    . Feeling of Stress :   Social Connections:   . Frequency of Communication with Friends and Family:   . Frequency of Social Gatherings with Friends and Family:   . Attends Religious Services:   . Active Member of Clubs or Organizations:   . Attends Banker Meetings:   Marland Kitchen Marital Status:   Intimate Partner Violence:   . Fear of Current or Ex-Partner:   . Emotionally Abused:   Marland Kitchen Physically Abused:   . Sexually Abused:     Review of Systems: All negative except as stated above in HPI.  Physical Exam: Vital signs: Vitals:   04/29/19 0920 04/29/19 0921  BP: (!) 127/94 (!) 127/94  Pulse:    Resp:    Temp:    SpO2:    P 47, T 97.9   General:   Alert, disheveled, oriented, thin, pleasant and cooperative in NAD Head: normocephalic, atraumatic Eyes: anicteric sclera ENT: oropharynx clear, poor dentition Neck: supple, nontender Lungs:  Clear throughout to auscultation.   No wheezes, crackles, or rhonchi. No acute distress. Heart: Bradycardic with regular rhythm; no murmurs, clicks, rubs,  or gallops. Abdomen: Soft, nontender, nondistended.  Normoactive bowel sounds. Rectal:  Deferred Ext: no edema  GI:  Lab Results: Recent Labs    04/28/19 1603 04/29/19 0226  WBC 10.1 8.3  HGB 16.2 14.8  HCT 47.6 44.3  PLT 300 252   BMET Recent Labs    04/28/19 1603 04/29/19 0226  NA 131* 130*  K 4.0 3.8  CL 92* 96*  CO2 27 26  GLUCOSE 165* 100*  BUN 12 7  CREATININE 0.80 0.62  CALCIUM 9.3 8.9   LFT Recent Labs    04/29/19 0226  PROT 5.8*  ALBUMIN 3.1*  AST 24  ALT 21  ALKPHOS 61  BILITOT 0.9   PT/INR Recent Labs    04/29/19 0131  LABPROT 12.3  INR 0.9     Studies/Results: DG Chest 2 View  Result Date: 04/28/2019 CLINICAL DATA:  Dysphagia, chest pain EXAM: CHEST - 2 VIEW COMPARISON:  11/07/2005 FINDINGS: Heart and mediastinal contours are within normal limits. No focal opacities or effusions. No acute bony abnormality. IMPRESSION: No active  cardiopulmonary disease. Electronically Signed   By: Charlett Nose M.D.   On: 04/28/2019 19:35    Impression/Plan: Progressive dysphagia and weight loss with history of erosive esophagitis and esophageal stricture but presentation concerning for malignancy. Plan for EGD today for further evaluation and potential dilation and biopsies.  Continue Protonix IV BID.    LOS: 1 day   [name]  04/29/2019, 10:21 AM  Questions please call [phone]

## 2019-04-29 NOTE — ED Notes (Signed)
Paged Admitting per RN 

## 2019-04-29 NOTE — Consult Note (Signed)
Referring Provider: Dr. Geiple (ED) Primary Care Physician:  [name], William A, MD Primary Gastroenterologist:  Unassigned  Reason for Consultation:  dysphagia  HPI: Ralph [name] is a 53 y.o. male with history of CVA (2018), MI (~8 years ago) and erosive esophagitis (2018) presenting with dysphagia.  He reports he has had dysphagia since 2018, but it has worsened over the last month.  Initially, he was able to swallow liquids, but over the past few days, he starting having dysphagia with liquids.  He reports that both solid and liquids are regurgitated back up shortly after swallowing.  He also notes small amount of red-tinged emesis over the past day.  He states he has lost approximately 30 lbs over the last month.  He endorses feeling hungry but is unable to oral intake.  He denies heartburn or abdominal pain but reports discomfort in his chest with swallowing. He denies any black or bloody bowel movements though states he only has 1 bowel movement a week due to lack of PO intake.  He denies family history of gastrointestinal malignancies.  When he was able to consume liquids, he would drink 40z beer daily.  He denies illicit drug use.  He is not currently on blood thinners but received 40mg SQ lovenox in the ED yesterday.  Last EGD was November 2018 and showed severe grade D erosive esophagitis with stricture at distal esophagus.  Dilation was not performed and no biopsies were taken.  Barium swallow preceding the EGD showed severe distal esophageal stricture concerning for mass.  Patient was to follow up in 3 months for repeat EGD but did not due so due to lack of insurance.   Past Medical History:  Diagnosis Date  . Coronary artery disease   . Myocardial infarction ([facility])   . Stroke ([facility])     Past Surgical History:  Procedure Laterality Date  . APPENDECTOMY    . SHOULDER SURGERY      Prior to Admission medications   Medication Sig Start Date End Date Taking? Authorizing Provider   atorvastatin (LIPITOR) 80 MG tablet Take 1 tablet (80 mg total) by mouth daily. Patient not taking: Reported on 04/28/2019 10/23/17   [name], William A, MD  gabapentin (NEURONTIN) 300 MG capsule Take 1 capsule (300 mg total) by mouth 3 (three) times daily. Patient not taking: Reported on 04/28/2019 10/23/17   [name], William A, MD    Scheduled Meds: . enoxaparin (LOVENOX) injection  40 mg Subcutaneous Q24H  . nicotine  14 mg Transdermal Daily  . pantoprazole (PROTONIX) IV  40 mg Intravenous Q12H   Continuous Infusions: . sodium chloride 100 mL/hr at 04/29/19 0011  . acetaminophen Stopped (04/29/19 0559)   PRN Meds:.HYDROmorphone (DILAUDID) injection  Allergies as of 04/28/2019  . (No Known Allergies)    History reviewed. No pertinent family history.  Social History   Socioeconomic History  . Marital status: Single    Spouse name: Not on file  . Number of children: Not on file  . Years of education: Not on file  . Highest education level: Not on file  Occupational History  . Not on file  Tobacco Use  . Smoking status: Current Every Day Smoker    Packs/day: 1.50    Years: 20.00    Pack years: 30.00    Types: Cigarettes  . Smokeless tobacco: Never Used  Substance and Sexual Activity  . Alcohol use: Yes    Alcohol/week: 1.0 standard drinks    Types: 1 Standard drinks or   equivalent per week  . Drug use: Yes    Types: Cocaine  . Sexual activity: Yes    Comment: with wife  Other Topics Concern  . Not on file  Social History Narrative  . Not on file   Social Determinants of Health   Financial Resource Strain:   . Difficulty of Paying Living Expenses:   Food Insecurity:   . Worried About Programme researcher, broadcasting/film/video in the Last Year:   . Barista in the Last Year:   Transportation Needs:   . Freight forwarder (Medical):   Marland Kitchen Lack of Transportation (Non-Medical):   Physical Activity:   . Days of Exercise per Week:   . Minutes of Exercise per Session:   Stress:    . Feeling of Stress :   Social Connections:   . Frequency of Communication with Friends and Family:   . Frequency of Social Gatherings with Friends and Family:   . Attends Religious Services:   . Active Member of Clubs or Organizations:   . Attends Banker Meetings:   Marland Kitchen Marital Status:   Intimate Partner Violence:   . Fear of Current or Ex-Partner:   . Emotionally Abused:   Marland Kitchen Physically Abused:   . Sexually Abused:     Review of Systems: All negative except as stated above in HPI.  Physical Exam: Vital signs: Vitals:   04/29/19 0920 04/29/19 0921  BP: (!) 127/94 (!) 127/94  Pulse:    Resp:    Temp:    SpO2:    P 47, T 97.9   General:   Alert, disheveled, oriented, thin, pleasant and cooperative in NAD Head: normocephalic, atraumatic Eyes: anicteric sclera ENT: oropharynx clear, poor dentition Neck: supple, nontender Lungs:  Clear throughout to auscultation.   No wheezes, crackles, or rhonchi. No acute distress. Heart: Bradycardic with regular rhythm; no murmurs, clicks, rubs,  or gallops. Abdomen: Soft, nontender, nondistended.  Normoactive bowel sounds. Rectal:  Deferred Ext: no edema  GI:  Lab Results: Recent Labs    04/28/19 1603 04/29/19 0226  WBC 10.1 8.3  HGB 16.2 14.8  HCT 47.6 44.3  PLT 300 252   BMET Recent Labs    04/28/19 1603 04/29/19 0226  NA 131* 130*  K 4.0 3.8  CL 92* 96*  CO2 27 26  GLUCOSE 165* 100*  BUN 12 7  CREATININE 0.80 0.62  CALCIUM 9.3 8.9   LFT Recent Labs    04/29/19 0226  PROT 5.8*  ALBUMIN 3.1*  AST 24  ALT 21  ALKPHOS 61  BILITOT 0.9   PT/INR Recent Labs    04/29/19 0131  LABPROT 12.3  INR 0.9     Studies/Results: DG Chest 2 View  Result Date: 04/28/2019 CLINICAL DATA:  Dysphagia, chest pain EXAM: CHEST - 2 VIEW COMPARISON:  11/07/2005 FINDINGS: Heart and mediastinal contours are within normal limits. No focal opacities or effusions. No acute bony abnormality. IMPRESSION: No active  cardiopulmonary disease. Electronically Signed   By: Charlett Nose M.D.   On: 04/28/2019 19:35    Impression/Plan: Progressive dysphagia and weight loss with history of erosive esophagitis and esophageal stricture but presentation concerning for malignancy. Plan for EGD today for further evaluation and potential dilation and biopsies.  Continue Protonix IV BID.    LOS: 1 day   [name]  04/29/2019, 10:21 AM  Questions please call [phone]

## 2019-04-29 NOTE — Transfer of Care (Signed)
Immediate Anesthesia Transfer of Care Note  Patient: Ralph [name]  Procedure(s) Performed: ESOPHAGOGASTRODUODENOSCOPY (EGD) WITH PROPOFOL (N/A ) BIOPSY  Patient Location: Endoscopy Unit  Anesthesia Type:MAC  Level of Consciousness: awake, alert  and oriented  Airway & Oxygen Therapy: Patient Spontanous Breathing and Patient connected to nasal cannula oxygen  Post-op Assessment: Report given to RN, Post -op Vital signs reviewed and stable and Patient moving all extremities  Post vital signs: Reviewed and stable  Last Vitals:  Vitals Value Taken Time  BP 120/88 04/29/19 1300  Temp 36.4 C 04/29/19 1201  Pulse 42 04/29/19 1300  Resp 14 04/29/19 1300  SpO2 100 % 04/29/19 1300  Vitals shown include unvalidated device data.  Last Pain:  Vitals:   04/29/19 1210  TempSrc:   PainSc: 0-No pain      Patients Stated Pain Goal: 0 (92/01/00 7121)  Complications: No apparent anesthesia complications

## 2019-04-29 NOTE — Progress Notes (Addendum)
Family Medicine Teaching Service Daily Progress Note Intern Pager: [phone]  Patient name: Ralph [name] Medical record number: [id number] Date of birth: March 16, 1965 Age: 54 y.o. Gender: male  Primary Care Provider: Zenia Resides, MD Consultants: GI Code Status: Full code  Pt Overview and Major Events to Date:  3/23 patient admitted for upper GI evaluation  Assessment and Plan: Ralph [name] is a 54 y.o. male presenting with dysphagia, regurgitation and weight loss. PMH is significant for Tobacco Use Disorder, Hx CVA, CAD, HTN, and HLD.   Dysphagia, weight loss with concern for GI malignancy  Ralph [name] is a 54 yr old who presents to the ER with worsening dysphagia, regurgitation of food and liquid and weight loss. Patient had EGD in 12/13/16 which showed severe distal esophageal stricture, possible peptic stricture versus mass and there was a concern for malignancy. He also had Grade D erosive esophagitis with stricture at distal of esophagus, recommended repeat EGD in 3 months. Was unable to follow-up with GI due to social issues and lack of insurance.  Morning hemoglobin 14.8 from 16.2.  Most likely dilutional.  Patient reports his pain is improving but is still present.  Would like to eat something if possible. -GI following Dr Alessandra Bevels, appreciate recommendations, possible EGD today 3/24 -Vitals per floor routine -Up with assistance -Was n.p.o. for procedure, may have diet once complete -N.saline 100cc/h -IV Protonix 40 mg -IV Protonix  -AM CBC -AM CMP  -PT/OT evaluation  -Consider CT abdomen, pelvis for investigation of GI malignancy.  Weight loss, unintentional Patient usually weighs 160-170 lb, now weighs 130lb. Appears cachetic on examination.   -Consider nutrition consult -Ensure shakes post EGD -Add Ensure to Diet to maintain nutrition -Consider Dietician consult  Hyponatremia Na  130 this morning.  It was 131 on admission.  No previous labs on file to determine  baseline. Most likely due to poor PO intake.  -Continue to monitor with BMP    HTN SBP blood pressures have ranged from 116/76-160/95 with most recent being 131/82. Not on known anithypertensive -Monitor BPs  Hyperlipidemia Lipid panel 2014: Cholesterol 194, HDL 50, LDL 125, triglycerides 96, VLDL 19 Home meds: prescribed Atorvastatin 80mg . Patient has not been taking due to cost of medications and GI symptoms. Patient has personal Hx of CVA in 2018, strong family history of MI/CVA.  -Hold atorvastatin as n.p.o, restart as soon as able to tolerate PO  Neuropathic pain Home meds: Gabapentin 300 mg 3 times daily, patient has not been taking due to cost of medications and GI symptoms  -Hold gabapentin as n.p.o., may restart as soon as diet is added  CVA Stroke in 2018, has left-sided residual weakness Home meds: aspirin 81mg  and atorvastatin 80mg , patient has not been taking due to cost of medications and GI symptoms  -Hold aspirin 81mg  and Atorvastatin, may restart atorvastatin as soon as diet is given  Tobacco Use disorder Patient smokes 1PPD since age 33.  -14 mg Nicotine patch   FEN/GI: N.p.o. until possible procedure, may have regular diet after PPx: SCDs  Disposition: Pending EGD  Subjective:  Patient reports he is doing okay this morning.  He slept from approximately 4 AM to 8:30 AM.  He says that his epigastric pain is improved but still present.  Would like something to eat but understands he cannot until we determine if he is going to have a procedure.  Objective: Temp:  [97.9 F (36.6 C)] 97.9 F (36.6 C) (03/23 1552) Pulse Rate:  [  45-80] 45 (03/24 0330) Resp:  [16-18] 16 (03/23 2005) BP: (115-160)/(75-106) 131/82 (03/24 0330) SpO2:  [96 %-100 %] 98 % (03/24 0330) Weight:  [59 kg] 59 kg (03/23 1553) Physical Exam: General: Sleeping when I enter room, easily awakes.  No acute distress Cardiovascular: Regular rate and rhythm, no murmurs appreciated Respiratory:  Auscultation bilaterally Abdomen: Soft, epigastric pain, tender to palpation Extremities: No edema noted  Laboratory: Recent Labs  Lab 04/28/19 1603 04/29/19 0226  WBC 10.1 8.3  HGB 16.2 14.8  HCT 47.6 44.3  PLT 300 252   Recent Labs  Lab 04/28/19 1603 04/29/19 0226  NA 131* 130*  K 4.0 3.8  CL 92* 96*  CO2 27 26  BUN 12 7  CREATININE 0.80 0.62  CALCIUM 9.3 8.9  PROT 6.5 5.8*  BILITOT 0.5 0.9  ALKPHOS 76 61  ALT 24 21  AST 34 24  GLUCOSE 165* 100*   Troponin 2>2 PT-12.3 INR-0.9  Magnesium-2.0 Imaging/Diagnostic Tests: DG Chest 2 View  Result Date: 04/28/2019 CLINICAL DATA:  Dysphagia, chest pain EXAM: CHEST - 2 VIEW COMPARISON:  11/07/2005 FINDINGS: Heart and mediastinal contours are within normal limits. No focal opacities or effusions. No acute bony abnormality. IMPRESSION: No active cardiopulmonary disease. Electronically Signed   By: Charlett Nose M.D.   On: 04/28/2019 19:35     Ralph Nip, MD 04/29/2019, 5:40 AM PGY-1, [facility] Family Medicine FPTS Intern pager: [phone], text pages welcome

## 2019-04-29 NOTE — ED Notes (Signed)
Pt c/o anxiety, Diplomatic Services operational officer to page admitting for orders.

## 2019-04-29 NOTE — ED Notes (Signed)
Visitor at bedside.

## 2019-04-29 NOTE — Op Note (Signed)
[facility] Patient Name: Ralph [name] Procedure Date : 04/29/2019 MRN: [id number] Attending MD: [name] , MD Date of Birth: 1965-07-29 CSN: [id number] Age: 54 Admit Type: Emergency Department Procedure:                Upper GI endoscopy Indications:              Dysphagia, Weight loss Providers:                [name], MD, [place] RN, RN,                            Beryle Beams, Technician Referring MD:             hospital team Medicines:                Propofol per Anesthesia, Monitored Anesthesia Care Complications:            No immediate complications. Estimated Blood Loss:     Estimated blood loss was minimal. Procedure:                Pre-Anesthesia Assessment:                           - Prior to the procedure, a History and Physical                            was performed, and patient medications and                            allergies were reviewed. The patient's tolerance of                            previous anesthesia was also reviewed. The risks                            and benefits of the procedure and the sedation                            options and risks were discussed with the patient.                            All questions were answered, and informed consent                            was obtained. Prior Anticoagulants: The patient has                            taken no previous anticoagulant or antiplatelet                            agents. ASA Grade Assessment: III - A patient with                            severe systemic disease. After reviewing the risks  and benefits, the patient was deemed in                            satisfactory condition to undergo the procedure.                           After obtaining informed consent, the endoscope was                            passed under direct vision. Throughout the                            procedure, the patient's blood pressure,  pulse, and                            oxygen saturations were monitored continuously. The                            [id number]-[id number] ([id number]) Olympus gastroscope was                            introduced through the mouth, and advanced to the                            second part of duodenum. The upper GI endoscopy was                            accomplished without difficulty. The patient                            tolerated the procedure well. Scope In: Scope Out: Findings:      LA Grade D (one or more mucosal breaks involving at least 75% of       esophageal circumference) esophagitis with bleeding was found 24 to 40       cm from the incisors. Biopsies were taken with a cold forceps for       histology. Estimated blood loss was minimal.      A large, ulcerating mass with bleeding was found in the distal       esophagus, 40 cm from the incisors. The mass was partially obstructing       and partially circumferential (involving two thirds of the lumen       circumference). Biopsies were taken with a cold forceps for histology.       Estimated blood loss was minimal.      The Z-line was found 40 cm from the incisors.      The entire examined stomach was normal.      The cardia and gastric fundus were normal on retroflexion.      The examined duodenum was normal. Impression:               - LA Grade D erosive esophagitis with bleeding.                            Biopsied.                           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                            Biopsied.                           - Z-line, 40 cm from the incisors.                           - Normal stomach.                           - Normal examined duodenum. Recommendation:           - Patient has a contact number available for                            emergencies. The signs and symptoms of potential                            delayed  complications were discussed with the                            patient. Return to normal activities tomorrow.                            Written discharge instructions were provided to the                            patient.                           - NPO.                           - Await pathology results.                           - Observe patient's clinical course. Procedure Code(s):        --- Professional ---                           [phone], Esophagogastroduodenoscopy, flexible,                            transoral; with biopsy, single or multiple Diagnosis Code(s):        --- Professional ---                           R13.10, Dysphagia, unspecified                           R63.4, Abnormal weight loss  D49.0, Neoplasm of unspecified behavior of                            digestive system                           K20.81, Other esophagitis with bleeding CPT copyright 2019 [employer]. All rights reserved. The codes documented in this report are preliminary and upon coder review may  be revised to meet current compliance requirements. Lear Ng, MD 04/29/2019 12:07:22 PM This report has been signed electronically. Number of Addenda: 0

## 2019-04-29 NOTE — Anesthesia Procedure Notes (Signed)
Procedure Name: MAC Date/Time: 04/29/2019 11:40 AM Performed by: Amadeo Garnet, CRNA Pre-anesthesia Checklist: Patient identified, Suction available, Emergency Drugs available and Patient being monitored Patient Re-evaluated:Patient Re-evaluated prior to induction Oxygen Delivery Method: Simple face mask Preoxygenation: Pre-oxygenation with 100% oxygen Induction Type: IV induction Placement Confirmation: positive ETCO2 Dental Injury: Teeth and Oropharynx as per pre-operative assessment

## 2019-04-29 NOTE — Brief Op Note (Signed)
Severe ulcerative esophagitis with a mass noted in the distal esophagus concerning for malignancy. Biopsies taken and rush put on pathology. Keep NPO and on IV PPI Q 12 hours. If biopsies show malignancy then will need staging with CT scan and inpt oncology appt. Will follow.

## 2019-04-29 NOTE — Interval H&P Note (Signed)
History and Physical Interval Note:  04/29/2019 11:35 AM  Ralph [name]  has presented today for surgery, with the diagnosis of Dysphagia; Weight loss.  The various methods of treatment have been discussed with the patient and family. After consideration of risks, benefits and other options for treatment, the patient has consented to  Procedure(s): ESOPHAGOGASTRODUODENOSCOPY (EGD) WITH PROPOFOL (N/A) as a surgical intervention.  The patient's history has been reviewed, patient examined, no change in status, stable for surgery.  I have reviewed the patient's chart and labs.  Questions were answered to the patient's satisfaction.     [name]

## 2019-04-29 NOTE — Anesthesia Preprocedure Evaluation (Addendum)
Anesthesia Evaluation  Patient identified by MRN, date of birth, ID band Patient awake    Reviewed: Allergy & Precautions, NPO status , Patient's Chart, lab work & pertinent test results  History of Anesthesia Complications Negative for: history of anesthetic complications  Airway Mallampati: II  TM Distance: >3 FB Neck ROM: Full    Dental  (+) Poor Dentition,    Pulmonary Current Smoker and Patient abstained from smoking.,    Pulmonary exam normal        Cardiovascular hypertension, + CAD, + Past MI and + Peripheral Vascular Disease  Normal cardiovascular exam     Neuro/Psych CVA negative psych ROS   GI/Hepatic negative GI ROS, Neg liver ROS,   Endo/Other  negative endocrine ROS  Renal/GU negative Renal ROS  negative genitourinary   Musculoskeletal negative musculoskeletal ROS (+)   Abdominal   Peds  Hematology negative hematology ROS (+)   Anesthesia Other Findings Day of surgery medications reviewed with patient.  Reproductive/Obstetrics negative OB ROS                            Anesthesia Physical Anesthesia Plan  ASA: III  Anesthesia Plan: MAC   Post-op Pain Management:    Induction:   PONV Risk Score and Plan: 1 and Treatment may vary due to age or medical condition and Propofol infusion  Airway Management Planned: Natural Airway and Nasal Cannula  Additional Equipment: None  Intra-op Plan:   Post-operative Plan:   Informed Consent: I have reviewed the patients History and Physical, chart, labs and discussed the procedure including the risks, benefits and alternatives for the proposed anesthesia with the patient or authorized representative who has indicated his/her understanding and acceptance.       Plan Discussed with: CRNA  Anesthesia Plan Comments:         Anesthesia Quick Evaluation

## 2019-04-29 NOTE — ED Notes (Addendum)
Tylenol given per MAR. Name/DOB verified with pt. Pt ambulatory to and from BR with strong, steady gait

## 2019-04-29 NOTE — ED Notes (Signed)
Unsuccessful attempt to call report  rn will call me back 

## 2019-04-29 NOTE — Anesthesia Postprocedure Evaluation (Signed)
Anesthesia Post Note  Patient: ALWYN CORDNER  Procedure(s) Performed: ESOPHAGOGASTRODUODENOSCOPY (EGD) WITH PROPOFOL (N/A ) BIOPSY     Patient location during evaluation: PACU Anesthesia Type: MAC Level of consciousness: awake and alert and oriented Pain management: pain level controlled Vital Signs Assessment: post-procedure vital signs reviewed and stable Respiratory status: spontaneous breathing, nonlabored ventilation and respiratory function stable Cardiovascular status: blood pressure returned to baseline Postop Assessment: no apparent nausea or vomiting Anesthetic complications: no    Last Vitals:  Vitals:   04/29/19 1210 04/29/19 1245  BP: 129/73 131/87  Pulse: (!) 54 (!) 48  Resp: 15 16  Temp:    SpO2: 100% 100%    Last Pain:  Vitals:   04/29/19 1210  TempSrc:   PainSc: 0-No pain                 Brennan Bailey

## 2019-04-29 NOTE — ED Notes (Signed)
Report has been called to ann rn on 3w

## 2019-04-30 ENCOUNTER — Inpatient Hospital Stay (HOSPITAL_COMMUNITY): Payer: Self-pay

## 2019-04-30 DIAGNOSIS — R079 Chest pain, unspecified: Secondary | ICD-10-CM

## 2019-04-30 DIAGNOSIS — R001 Bradycardia, unspecified: Secondary | ICD-10-CM

## 2019-04-30 DIAGNOSIS — K228 Other specified diseases of esophagus: Secondary | ICD-10-CM

## 2019-04-30 DIAGNOSIS — R9431 Abnormal electrocardiogram [ECG] [EKG]: Secondary | ICD-10-CM

## 2019-04-30 LAB — COMPREHENSIVE METABOLIC PANEL
ALT: 18 U/L (ref 0–44)
AST: 21 U/L (ref 15–41)
Albumin: 3.3 g/dL — ABNORMAL LOW (ref 3.5–5.0)
Alkaline Phosphatase: 68 U/L (ref 38–126)
Anion gap: 12 (ref 5–15)
BUN: 7 mg/dL (ref 6–20)
CO2: 24 mmol/L (ref 22–32)
Calcium: 9.1 mg/dL (ref 8.9–10.3)
Chloride: 98 mmol/L (ref 98–111)
Creatinine, Ser: 0.65 mg/dL (ref 0.61–1.24)
GFR calc Af Amer: >60 mL/min (ref >60)
GFR calc non Af Amer: >60 mL/min (ref >60)
Glucose, Bld: 81 mg/dL (ref 70–99)
Potassium: 3.9 mmol/L (ref 3.5–5.1)
Sodium: 134 mmol/L — ABNORMAL LOW (ref 135–145)
Total Bilirubin: 0.9 mg/dL (ref 0.3–1.2)
Total Protein: 6.2 g/dL — ABNORMAL LOW (ref 6.5–8.1)

## 2019-04-30 LAB — CBC
HCT: 48.1 % (ref 39.0–52.0)
Hemoglobin: 15.7 g/dL (ref 13.0–17.0)
MCH: 31.9 pg (ref 26.0–34.0)
MCHC: 32.6 g/dL (ref 30.0–36.0)
MCV: 97.8 fL (ref 80.0–100.0)
Platelets: 265 10*3/uL (ref 150–400)
RBC: 4.92 MIL/uL (ref 4.22–5.81)
RDW: 12.8 % (ref 11.5–15.5)
WBC: 6.8 10*3/uL (ref 4.0–10.5)
nRBC: 0 % (ref 0.0–0.2)

## 2019-04-30 LAB — ECHOCARDIOGRAM COMPLETE
Height: 69 in
Weight: 2144.63 oz

## 2019-04-30 MED ORDER — NICOTINE 21 MG/24HR TD PT24
21.0000 mg | MEDICATED_PATCH | Freq: Every day | TRANSDERMAL | Status: DC
  Administered 2019-05-01 – 2019-05-02 (×2): 21 mg via TRANSDERMAL
  Filled 2019-04-30 (×2): qty 1

## 2019-04-30 MED ORDER — ACETAMINOPHEN 160 MG/5ML PO SOLN
325.0000 mg | ORAL | Status: DC | PRN
  Administered 2019-04-30 – 2019-05-02 (×10): 325 mg via ORAL
  Filled 2019-04-30 (×10): qty 20.3

## 2019-04-30 MED ORDER — OXYCODONE HCL 5 MG/5ML PO SOLN
5.0000 mg | ORAL | Status: DC | PRN
  Administered 2019-04-30 – 2019-05-02 (×12): 10 mg via ORAL
  Filled 2019-04-30 (×13): qty 10

## 2019-04-30 MED ORDER — LORAZEPAM 2 MG/ML IJ SOLN
0.5000 mg | Freq: Two times a day (BID) | INTRAMUSCULAR | Status: DC | PRN
  Administered 2019-04-30 – 2019-05-01 (×2): 0.5 mg via INTRAVENOUS
  Filled 2019-04-30 (×2): qty 1

## 2019-04-30 MED ORDER — HYDROXYZINE HCL 50 MG/ML IM SOLN
15.0000 mg | Freq: Four times a day (QID) | INTRAMUSCULAR | Status: DC | PRN
  Filled 2019-04-30: qty 0.3

## 2019-04-30 MED ORDER — HYDRALAZINE HCL 20 MG/ML IJ SOLN
5.0000 mg | Freq: Three times a day (TID) | INTRAMUSCULAR | Status: DC
  Administered 2019-04-30: 5 mg via INTRAVENOUS
  Filled 2019-04-30: qty 1

## 2019-04-30 MED ORDER — LORAZEPAM BOLUS VIA INFUSION: 0.5000 mg | Freq: Two times a day (BID) | INTRAVENOUS | Status: DC | PRN

## 2019-04-30 NOTE — Progress Notes (Signed)
MD at bedside to see patient

## 2019-04-30 NOTE — Progress Notes (Signed)
Dr.Patel returned call new order received for EKG.

## 2019-04-30 NOTE — Progress Notes (Addendum)
[facility] Gastroenterology Progress Note  Ralph [name] y.o. March 16, 1965   Subjective: Feels ok. Denies abdominal pain. Sitting up in bed. NPO.  Objective: Vital signs: Vitals:   04/30/19 0555 04/30/19 0734  BP: 137/88 (!) 136/92  Pulse:  (!) 42  Resp: 20 14  Temp: 97.7 F (36.5 C) 97.6 F (36.4 C)  SpO2: 98% 100%    Physical Exam: Gen: thin, alert, no acute distress, multiple tattoos HEENT: anicteric sclera CV: RRR Chest: CTA B Abd: soft, nontender, nondistended, +BS Ext: no edema  Lab Results: Recent Labs    04/29/19 0226 04/29/19 0227 04/30/19 0245  NA 130*  --  134*  K 3.8  --  3.9  CL 96*  --  98  CO2 26  --  24  GLUCOSE 100*  --  81  BUN 7  --  7  CREATININE 0.62  --  0.65  CALCIUM 8.9  --  9.1  MG  --  2.0  --    Recent Labs    04/29/19 0226 04/30/19 0245  AST 24 21  ALT 21 18  ALKPHOS 61 68  BILITOT 0.9 0.9  PROT 5.8* 6.2*  ALBUMIN 3.1* 3.3*   Recent Labs    04/29/19 0226 04/30/19 0245  WBC 8.3 6.8  HGB 14.8 15.7  HCT 44.3 48.1  MCV 97.4 97.8  PLT 252 265      Assessment/Plan: 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 Biopsies pending and hopefully results back by tomorrow. Ice chips and sips of clears ok. Continue IV PPI Q 12 hours and IVFs. Supportive care. Will await biopsies but will need a chest/abd/pelvis CT with contrast if biopsies consistent with cancer.   [name] 04/30/2019, 9:22 AM  Questions please call [phone] ID: Ralph [name], male   DOB: 1965-04-12, 54 y.o.   MRN: [id number]

## 2019-04-30 NOTE — Progress Notes (Signed)
Patient arrived to unit 3 west bed 30 from emergency department.Assisted patient to bed by nursing staff. Oriented patient to nursing unit and  call bell,phone,and personal items within reach.

## 2019-04-30 NOTE — Progress Notes (Addendum)
Family Medicine Teaching Service Daily Progress Note Intern Pager: [phone]  Patient name: Ralph [name] Medical record number: [id number] Date of birth: 1965-03-04 Age: 54 y.o. Gender: male  Primary Care Provider: Moses Manners, MD Consultants: GI Code Status: Full code  Pt Overview and Major Events to Date:  3/23 patient admitted for upper GI evaluation  Assessment and Plan: Ralph [name] is a 54 y.o. male presenting with dysphagia, regurgitation and weight loss. PMH is significant for Tobacco Use Disorder, Hx CVA, CAD, HTN, and HLD.   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 -GI following Dr Levora Angel, appreciate recommendations -Continue to keep n.p.o. except for ice chips at this time -IV PPI every 12 hours and IV fluids -Plan on CT chest today given right-sided chest pain.  We will touch base with GI to determine if they want [place] to go ahead and do an abdomen and pelvis at the same time. -Vitals per floor routine -Up with assistance -N.saline 100cc/h -IV Protonix twice daily -Pain control with oxycodone 5-10 mg every 4 hours as needed for moderate to severe pain.  Tylenol solution every 4 hours as needed for mild to moderate pain -AM CBC -AM CMP  -PT/OT evaluation  Bradycardia Patient's pulse rate has been bradycardic since admission.  Range over the last 24 hours has been from 38-57.  Unsure of etiology at this time. -We will check TSH -Continuous cardiac monitoring  Weight loss, unintentional Patient usually weighs 160-170 lb, now weighs 130lb. Appears cachetic on examination.   -Nutrition consulted appreciate recommendations -Ensure shakes post EGD -Add Ensure to Diet to maintain nutrition once no longer n.p.o. -Consider Dietician consult  Hyponatremia Na   134 this morning.  It was 131 on admission.  No previous labs on file to determine baseline. Most likely due to poor PO intake.  -Continue to monitor with BMP -Continue normal saline at 100 mL/h   HTN SBP blood pressures have ranged from 132/92-165/96 with most recent being 165/96. Not on known anithypertensive -Monitor BPs  Hyperlipidemia Lipid panel 2014: Cholesterol 194, HDL 50, LDL 125, triglycerides 96, VLDL 19 Home meds: prescribed Atorvastatin 80mg . Patient has not been taking due to cost of medications and GI symptoms. Patient has personal Hx of CVA in 2018, strong family history of MI/CVA.  -Hold atorvastatin as n.p.o, restart as soon as able to tolerate PO  Neuropathic pain Home meds: Gabapentin 300 mg 3 times daily, patient has not been taking due to cost of medications and GI symptoms  -Hold gabapentin as n.p.o., may restart as soon as diet is added  CVA Stroke in 2018, has left-sided residual weakness Home meds: aspirin 81mg  and atorvastatin 80mg , patient has not been taking due to cost of medications and GI symptoms  -Hold aspirin 81mg  and Atorvastatin, may restart atorvastatin as soon as diet is given  Substance abuse Patient reports he drinks a 40 ounce beer nightly as well as uses Xanax occasionally. -CIWA protocol -Monitor for withdrawal  Tobacco Use disorder Patient smokes 1PPD since age 28.  - 21 mg nicotine patch  FEN/GI: N.p.o. at this time PPx: SCDs  Disposition: Pending EGD  Subjective:  Patient reports that he is stressed this morning given the findings from his EGD yesterday.  He was also upset when I enter the room because he had just gotten  up to go to the restroom and everyone came running in because his bed alarm went off.  We discussed that his biopsies are not back yet but when they do we will let him know the next steps in his care.  He says that his wife will be here this morning at some point.  No other concerns at this  time  Objective: Temp:  [97.6 F (36.4 C)-97.8 F (36.6 C)] 97.7 F (36.5 C) (03/25 0555) Pulse Rate:  [38-57] 43 (03/25 0345) Resp:  [10-30] 20 (03/25 0555) BP: (115-167)/(70-104) 137/88 (03/25 0555) SpO2:  [98 %-100 %] 98 % (03/25 0555) Weight:  [59 kg-60.8 kg] 60.8 kg (03/24 2210) Physical Exam: General: Sitting in bed when out of the room.  Cachectic Cardiovascular: Bradycardic on exam, regular rhythm Respiratory: Clear to auscultation bilaterally, no wheezes noted Abdomen: Soft, no epigastric pain noted this morning, right sided rib pain, tenderness to palpation Extremities: No edema noted  Laboratory: Recent Labs  Lab 04/28/19 1603 04/29/19 0226 04/30/19 0245  WBC 10.1 8.3 6.8  HGB 16.2 14.8 15.7  HCT 47.6 44.3 48.1  PLT 300 252 265   Recent Labs  Lab 04/28/19 1603 04/29/19 0226 04/30/19 0245  NA 131* 130* 134*  K 4.0 3.8 3.9  CL 92* 96* 98  CO2 27 26 24   BUN 12 7 7   CREATININE 0.80 0.62 0.65  CALCIUM 9.3 8.9 9.1  PROT 6.5 5.8* 6.2*  BILITOT 0.5 0.9 0.9  ALKPHOS 76 61 68  ALT 24 21 18   AST 34 24 21  GLUCOSE 165* 100* 81   Troponin 2>2 PT-12.3 INR-0.9  Magnesium-2.0 Imaging/Diagnostic Tests: DG Chest 2 View  Result Date: 04/28/2019 CLINICAL DATA:  Dysphagia, chest pain EXAM: CHEST - 2 VIEW COMPARISON:  11/07/2005 FINDINGS: Heart and mediastinal contours are within normal limits. No focal opacities or effusions. No acute bony abnormality. IMPRESSION: No active cardiopulmonary disease. Electronically Signed   By: Rolm Baptise M.D.   On: 04/28/2019 19:35    Gifford Shave, MD 04/30/2019, 6:04 AM PGY-1, [place] Intern pager: [phone], text pages welcome

## 2019-04-30 NOTE — Progress Notes (Signed)
Patient complaining pain left chest pain 10/10.Patient heart rate in 40's sinus bradycardia.Call placed to Dr.Patel.MD to come to bedside to evaluate patient.

## 2019-04-30 NOTE — Progress Notes (Signed)
Occupational Therapy Evaluation Only Patient Details Name: Ralph [name] MRN: [id number] DOB: 26-Feb-1965 Today's Date: 04/30/2019    History of Present Illness Ralph [name] is a 54 y.o. male with history of CVA (2018), MI (~8 years ago) and erosive esophagitis (2018) presenting with dysphagia.  He reports he has had dysphagia since 2018, but it has worsened over the last month.  Initially, he was able to swallow liquids, but over the past few days, he starting having dysphagia with liquids.  He reports that both solid and liquids are regurgitated back up shortly after swallowing.  He also notes small amount of red-tinged emesis over the past day.  He states he has lost approximately 30 lbs over the last month.   Clinical Impression   PTA pt lived with his girlfriend, independent in all ADL, IADL, and mobility tasks. Pt does not ambulate with an assistive device and reports 0 falls in the last 6 months. Pt able to ambulate around room and manage IV pole safely with 0 instances of LOB. Pt completed toileting task, able to stand ~3 min at the sink to wash hands and brush teeth. Simulated tub/shower transfer in room noting good independence and balance. Pt appears to be functioning at/near baseline for self-care and functional transfer tasks. Skilled OT services not warranted at this time.     Follow Up Recommendations  No OT follow up    Equipment Recommendations  None recommended by OT    Recommendations for Other Services       Precautions / Restrictions Precautions Precautions: None Restrictions Weight Bearing Restrictions: No      Mobility Bed Mobility               General bed mobility comments: Pt seated EOB upon OT arrival  Transfers Overall transfer level: Independent Equipment used: None             General transfer comment: Pt ambulated around room independently, able to manage IV pole. 0 instances of LOB.     Balance Overall balance assessment: No apparent  balance deficits (not formally assessed)                                         ADL either performed or assessed with clinical judgement   ADL Overall ADL's : At baseline;Independent                                       General ADL Comments: Pt completed ADLs independently, noting 0 instances of LOB throughout.      Vision Baseline Vision/History: Wears glasses Wears Glasses: Reading only       Perception     Praxis      Pertinent Vitals/Pain Pain Assessment: 0-10 Pain Score: 7  Pain Location: Right back and flank pain Pain Descriptors / Indicators: Aching;Grimacing Pain Intervention(s): Limited activity within patient's tolerance;Monitored during session(RN notified)     Hand Dominance Right   Extremity/Trunk Assessment Upper Extremity Assessment Upper Extremity Assessment: Generalized weakness;LUE deficits/detail LUE Deficits / Details: LUE ROM WFL, strength equal bilaterally. Decreased [place] from previous CVA ~8 years ago.  LUE Coordination: decreased fine motor   Lower Extremity Assessment Lower Extremity Assessment: Defer to PT evaluation       Communication Communication Communication: No difficulties   Cognition Arousal/Alertness:  Awake/alert Behavior During Therapy: WFL for tasks assessed/performed(tearful at times) Overall Cognitive Status: Within Functional Limits for tasks assessed                                 General Comments: Pt pleasant and willing to participate in therapy.   General Comments  No signs/symptoms of distress. HR fluctuated between 55-60bpm during session    Exercises     Shoulder Instructions      Home Living Family/patient expects to be discharged to:: Private residence Living Arrangements: Spouse/significant other Available Help at Discharge: Family(girlfriend) Type of Home: Mobile home Home Access: Ramped entrance Entrance Stairs-Number of Steps: 3   Home Layout: One  level     Bathroom Shower/Tub: Chief Strategy Officer: Standard     Home Equipment: Shower seat          Prior Functioning/Environment Level of Independence: Independent        Comments: Pt independent in ADLs, IADLs, and mobility. Pt does not ambulate with an assistive device and reports 0 falls in the last 6 months. Pt still drives.        OT Problem List:        OT Treatment/Interventions:      OT Goals(Current goals can be found in the care plan section)    OT Frequency:     Barriers to D/C:            Co-evaluation              AM-PAC OT "6 Clicks" Daily Activity     Outcome Measure Help from another person eating meals?: None Help from another person taking care of personal grooming?: None Help from another person toileting, which includes using toliet, bedpan, or urinal?: None Help from another person bathing (including washing, rinsing, drying)?: None Help from another person to put on and taking off regular upper body clothing?: None Help from another person to put on and taking off regular lower body clothing?: None 6 Click Score: 24   End of Session Nurse Communication: Mobility status  Activity Tolerance: Patient limited by pain Patient left: in bed;with call bell/phone within reach;with nursing/sitter in room  OT Visit Diagnosis: Pain Pain - Right/Left: Right Pain - part of body: (back and flank)                Time: 2671-2458 OT Time Calculation (min): 14 min Charges:  OT General Charges $OT Visit: 1 Visit OT Evaluation $OT Eval Low Complexity: 1 Low  Peterson Ao OTR/L [phone]  Peterson Ao 04/30/2019, 8:31 AM

## 2019-04-30 NOTE — Evaluation (Signed)
Physical Therapy Evaluation Patient Details Name: Ralph [name] MRN: [id number] DOB: 1965/09/30 Today's Date: 04/30/2019   History of Present Illness  Ralph [name] is a 54 y.o. male with history of CVA (2018), MI (~8 years ago) and erosive esophagitis (2018) presenting with dysphagia.  He reports he has had dysphagia since 2018, but it has worsened over the last month.  Initially, he was able to swallow liquids, but over the past few days, he starting having dysphagia with liquids.  He reports that both solid and liquids are regurgitated back up shortly after swallowing.  He also notes small amount of red-tinged emesis over the past day.  He states he has lost approximately 30 lbs over the last month.  Clinical Impression  Pt in bed upon arrival of PT, agreeable to evaluation at this time. Prior to admission the pt was independent with all mobility and ADLs, living with his girlfriend in a home with a ramped entrance. The pt now presents with minor limitations in functional mobility, endurance, and dynamic stability due to above dx and resulting pain, but was able to demo good independence with all bed mobility, transfers, and ambulation in the hallway. The pt was educated on the importance of safety with mobility, recommendations for stair navigation if needed, and pt was agreeable to all education. The pt does appear to have any acute PT needs at this time, but will continue to benefit from mobility and ambulation as able both here and following d/c home with support from his family.     Follow Up Recommendations No PT follow up;Supervision for mobility/OOB    Equipment Recommendations  None recommended by PT    Recommendations for Other Services       Precautions / Restrictions Precautions Precautions: None Restrictions Weight Bearing Restrictions: No      Mobility  Bed Mobility Overal bed mobility: Independent             General bed mobility comments: pt able to get into and  OOB without assist  Transfers Overall transfer level: Independent Equipment used: None             General transfer comment: Pt ambulated around room independently, able to manage IV pole. 0 instances of LOB.   Ambulation/Gait Ambulation/Gait assistance: Supervision Gait Distance (Feet): 250 Feet Assistive device: None Gait Pattern/deviations: WFL(Within Functional Limits);Step-through pattern   Gait velocity interpretation: 1.31 - 2.62 ft/sec, indicative of limited community ambulator General Gait Details: pt with functionally safe ambulation, good management of IV pole and stability without use of IV pole. Pt independent within room, benefits from supervision for safety with longer ambulation  Stairs            Wheelchair Mobility    Modified Rankin (Stroke Patients Only)       Balance Overall balance assessment: No apparent balance deficits (not formally assessed)                                           Pertinent Vitals/Pain Pain Assessment: Faces Pain Score: 7  Faces Pain Scale: Hurts little more Pain Location: Right back and flank pain Pain Descriptors / Indicators: Aching;Grimacing Pain Intervention(s): Limited activity within patient's tolerance;Monitored during session    Home Living Family/patient expects to be discharged to:: Private residence Living Arrangements: Spouse/significant other Available Help at Discharge: Family(girlfriend and her father) Type of Home: Mobile home  Home Access: Ramped entrance   Entrance Stairs-Number of Steps: 3 Home Layout: One level Home Equipment: Shower seat Additional Comments: pt not using AD prior to admission    Prior Function Level of Independence: Independent         Comments: Pt independent in ADLs, IADLs, and mobility. Pt does not ambulate with an assistive device and reports 0 falls in the last 6 months. Pt still drives.     Hand Dominance   Dominant Hand: Right     Extremity/Trunk Assessment   Upper Extremity Assessment Upper Extremity Assessment: Defer to OT evaluation LUE Deficits / Details: LUE ROM WFL, strength equal bilaterally. Decreased FMC from previous CVA ~8 years ago.  LUE Coordination: decreased fine motor    Lower Extremity Assessment Lower Extremity Assessment: Overall WFL for tasks assessed    Cervical / Trunk Assessment Cervical / Trunk Assessment: Normal  Communication   Communication: No difficulties  Cognition Arousal/Alertness: Awake/alert Behavior During Therapy: WFL for tasks assessed/performed Overall Cognitive Status: Within Functional Limits for tasks assessed                                 General Comments: Pt pleasant and willing to participate in therapy, frustrated with use of bed alarms, educated on importance for safety during his stay her, pt verbalized agreement      General Comments General comments (skin integrity, edema, etc.): No signs/symptoms of distress. HR fluctuated between 55-60bpm during session    Exercises     Assessment/Plan    PT Assessment Patent does not need any further PT services  PT Problem List Decreased mobility;Decreased range of motion;Decreased balance;Pain       PT Treatment Interventions Therapeutic exercise;Gait training;Balance training;Functional mobility training;Therapeutic activities;Patient/family education    PT Goals (Current goals can be found in the Care Plan section)  Acute Rehab PT Goals Patient Stated Goal: to return home PT Goal Formulation: With patient Time For Goal Achievement: 05/14/19 Potential to Achieve Goals: Good    Frequency     Barriers to discharge        Co-evaluation               AM-PAC PT "6 Clicks" Mobility  Outcome Measure Help needed turning from your back to your side while in a flat bed without using bedrails?: None Help needed moving from lying on your back to sitting on the side of a flat bed without  using bedrails?: None Help needed moving to and from a bed to a chair (including a wheelchair)?: None Help needed standing up from a chair using your arms (e.g., wheelchair or bedside chair)?: None Help needed to walk in hospital room?: None Help needed climbing 3-5 steps with a railing? : A Little 6 Click Score: 23    End of Session Equipment Utilized During Treatment: Gait belt Activity Tolerance: Patient tolerated treatment well;Patient limited by pain Patient left: in bed;with call bell/phone within reach;with bed alarm set;with nursing/sitter in room(pt reported onset of LBP, wanted to lie down for pain relief)   PT Visit Diagnosis: Difficulty in walking, not elsewhere classified (R26.2);Pain Pain - Right/Left: Right Pain - part of body: (back)    Time: 8101-7510 PT Time Calculation (min) (ACUTE ONLY): 13 min   Charges:   PT Evaluation $PT Eval Low Complexity: 1 Low          Rolm Baptise, PT, DPT   Acute Rehabilitation Department Pager #: [phone]  Gaetana Michaelis 04/30/2019, 10:59 AM

## 2019-04-30 NOTE — Progress Notes (Signed)
Initial Nutrition Assessment  **RD working remotely**  DOCUMENTATION CODES:   Not applicable  INTERVENTION:  Monitor for diet advancement and order supplements as appropriate.  If pt is to remain NPO, nutrition support should be initiated.    NUTRITION DIAGNOSIS:   Inadequate oral intake related to dysphagia as evidenced by per patient/family report.   GOAL:   Patient will meet greater than or equal to 90% of their needs   MONITOR:   Diet advancement  REASON FOR ASSESSMENT:   Consult Assessment of nutrition requirement/status  ASSESSMENT:   Pt with a PMH significant for CVA, MI, and erosive esophagitis presented with worsening dysphagia, red-tinged emesis x 1 day, and 30-40 lb wt loss  3/24 s/p EGD  Pt had EGD on 12/13/16 showing grade D erosive esophagitis, severe distal esophageal stricture, and possible peptic stricture vs mass with concern for malignancy. Pt was supposed to have repeat EGD 3 months from that time but was unable due to lack of insurance.   Repeat EGD on 04/29/19 revealed malignant-appearing distal esophageal ulcerated mass with severe ulcerative esophagitis proximal to the mass   RD attempted to call pt 3x; pt hung up 2x and did not answer the third call.   Per H&P, pt has had dysphagia since 2018, but it has been getting worse over the last month. Pt reports initially being able to swallow liquids, but has been having difficulty with liquids for the last few days. Pt reports solids and liquids are regurgitated back up shortly after swallowing. Pt endorses hunger upon admission, but is unable to eat comfortably. Pt reports drinking 40oz of beer daily when he was able to swallow liquids comfortably.   Pt's usual body weight is 160-170 lbs. Pt currently weighs 134.04. Per weight readings in chart, pt with an 18% wt loss x1.5 years, which is not significant for time frame.   Malnutrition is suspected due to pt's prolonged poor intake; however, cannot  diagnose without detailed diet history and nutrition-focused physical exam.   UOP: x24 hours I/O: +446.44ml since admit  Medications reviewed. Labs reviewed: Na 134 (L)  NUTRITION - FOCUSED PHYSICAL EXAM:  RD unable to perform at this time.   Diet Order:   Diet Order            Diet NPO time specified Except for: Ice Chips, Other (See Comments)  Diet effective midnight              EDUCATION NEEDS:   Not appropriate for education at this time  Skin:  Skin Assessment: Reviewed RN Assessment  Last BM:  3/23  Height:   Ht Readings from Last 1 Encounters:  04/29/19 5\' 9"  (1.753 m)    Weight:   Wt Readings from Last 4 Encounters:  04/29/19 60.8 kg  04/12/19 61.2 kg  10/23/17 74.3 kg  08/29/17 74.8 kg    BMI:  Body mass index is 19.79 kg/m.  Estimated Nutritional Needs:   Kcal:  1850-2050  Protein:  90-105 grams  Fluid:  >/= 1.85 L    08/31/17, MS, RD, LDN RD pager number and weekend/on-call pager number located in [place].

## 2019-04-30 NOTE — Discharge Summary (Signed)
Family Medicine Teaching [facility] Discharge Summary  Patient name: Ralph [name] Medical record number: [id number] Date of birth: 1965-06-10 Age: 54 y.o. Gender: male Date of Admission: 04/28/2019  Date of Discharge: 05/02/2019 Admitting Physician: Doreene Eland, MD  Primary Care Provider: Moses Manners, MD Consultants: GI  Indication for Hospitalization: Dysphagia with weight loss.  Concern for malignancy  Discharge Diagnoses/Problem List:  Dysphagia, weight loss with concern for malignancy Bradycardia Weight loss Hyponatremia Hypertension Hyperlipidemia CVA Substance abuse Tobacco use disorder  Disposition: Home  Discharge Condition: Stable, improved  Discharge Exam: Temp:  [97.4 F (36.3 C)-98 F (36.7 C)] 97.5 F (36.4 C) (03/26 0450) Pulse Rate:  [39-56] 56 (03/26 0450) Resp:  [14-20] 18 (03/26 0450) BP: (96-175)/(73-102) 147/88 (03/26 0450) SpO2:  [98 %-100 %] 100 % (03/26 0450) Physical Exam: General: Sitting on bed when I enter the room, cachectic Cardiovascular: Bradycardic on exam, regular rhythm Respiratory: Clear to auscultation bilaterally, no wheezes noted Abdomen: Soft, bowel sounds positive, pain is well controlled Extremities: No edema noted  Brief Hospital Course:  Dysphagia with weight loss Patient presented to the emergency department for dysphagia and weight loss.  He was having difficulty swallowing and burning in his chest.  He was evaluated by GI who recommended EGD.  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  This was biopsied which showed squamocolumnar junction with focal interstitial metaplasia, no dysplasia or malignancy identified.  The pathologist did comment saying would recommend repeat biopsy if lesion is worrisome clinically.  The case was discussed with the GI provider.  A CT chest abdomen and pelvis  was also obtained which showed irregular thickening of the distal esophagus and gastroesophageal junction with periesophageal stranding.  There are also mildly enlarged lymph nodes in the mediastinum which were small but suspicious given the esophageal findings particularly given the enlargement of a hepatic gastric lymph node below the diaphragm.  Radiology commented that PET scan may be helpful for further evaluation.  Hypertension Patient remained hypotensive for much of admission.  He was also bradycardic with pulses ranging from the 30s-50s.  Patient was started on Norvasc 5 mg daily.  Bradycardia Patient was bradycardic throughout admission.  His heart rate for most of his admission ranged in the 30s-50s.  There was unknown etiology.  A TSH was checked which came back at 1.152.  Bradycardia persisted for most of the hospitalization but on discharge patient's heart rate was regular rate.  Issues for Follow Up:  1. GI follow-up regarding EUS versus repeat EGD with additional biopsies in the near future.  Patient advised to call on 3/29 to schedule follow up. 2. Blood pressure follow-up, was started on Norvasc 5 mg while hospitalized 3. Hep C testing outpatient 4. Patient was referred to chronic care management on discharge for assistance with insurance needs and costs.  Significant Procedures:  3/25 EGD with biopsy   Significant Labs and Imaging:  Recent Labs  Lab 04/30/19 0245 05/01/19 0414 05/02/19 0441  WBC 6.8 5.2 4.4  HGB 15.7 15.6 14.4  HCT 48.1 47.3 43.0  PLT 265 273 254   Recent Labs  Lab 04/28/19 1603 04/28/19 1603 04/29/19 0226 04/29/19 0226 04/29/19 0227 04/30/19 0245 04/30/19 0245 05/01/19 0414 05/02/19 0441  NA 131*  --  130*  --   --  134*  --  136 138  K 4.0   < > 3.8   < >  --  3.9   < > 3.9 3.4*  CL 92*  --  96*  --   --  98  --  102 104  CO2 27  --  26  --   --  24  --  25 23  GLUCOSE 165*  --  100*  --   --  81  --  86 82  BUN 12  --  7  --   --  7   --  5* <5*  CREATININE 0.80  --  0.62  --   --  0.65  --  0.75 0.76  CALCIUM 9.3  --  8.9  --   --  9.1  --  9.2 9.0  MG  --   --   --   --  2.0  --   --   --   --   ALKPHOS 76  --  61  --   --  68  --  60 53  AST 34  --  24  --   --  21  --  15 14*  ALT 24  --  21  --   --  18  --  15 13  ALBUMIN 3.4*  --  3.1*  --   --  3.3*  --  3.3* 2.9*   < > = values in this interval not displayed.    TSH-1.152  Results/Tests Pending at Time of Discharge: None  Discharge Medications:  Allergies as of 05/02/2019   No Known Allergies     Medication List    STOP taking these medications   gabapentin 300 MG capsule Commonly known as: NEURONTIN     TAKE these medications   atorvastatin 80 MG tablet Commonly known as: LIPITOR Take 1 tablet (80 mg total) by mouth daily.   oxyCODONE-acetaminophen 5-325 MG tablet Commonly known as: Percocet Take 1 tablet by mouth every 8 (eight) hours as needed for severe pain.   pantoprazole 40 MG tablet Commonly known as: Protonix Take 1 tablet (40 mg total) by mouth 2 (two) times daily.       Discharge Instructions: Please refer to Patient Instructions section of EMR for full details.  Patient was counseled important signs and symptoms that should prompt return to medical care, changes in medications, dietary instructions, activity restrictions, and follow up appointments.   Follow-Up Appointments: Follow-up Information    Arta Silence, MD. Schedule an appointment as soon as possible for a visit.   Specialty: Gastroenterology Why: Please call Monday morning to make an appointment for this week to follow up with Dr. Paulita Fujita. Contact information: [address] [address] [phone]        [place]. Go on 05/04/2019.   Why: at 10:50 am.  Please arrive 15 minutes prior to appointment time. Contact information: [place]           Gifford Shave, MD 05/04/2019, 6:56 PM PGY-1, [place]

## 2019-04-30 NOTE — Progress Notes (Signed)
Patient heart rate dropping down low 30 while sleeping,Awaken patient asymptomatic blood pressure 137/88 heart rate 42. Paged MD on call awaiting a response.

## 2019-04-30 NOTE — Progress Notes (Signed)
New orders received for pain medications.

## 2019-04-30 NOTE — Progress Notes (Signed)
Echocardiogram 2D Echocardiogram has been performed.  Ralph [name] Ralph [name] 04/30/2019, 3:12 PM

## 2019-05-01 ENCOUNTER — Inpatient Hospital Stay (HOSPITAL_COMMUNITY): Payer: Self-pay

## 2019-05-01 DIAGNOSIS — I2510 Atherosclerotic heart disease of native coronary artery without angina pectoris: Secondary | ICD-10-CM

## 2019-05-01 DIAGNOSIS — I2583 Coronary atherosclerosis due to lipid rich plaque: Secondary | ICD-10-CM

## 2019-05-01 DIAGNOSIS — R590 Localized enlarged lymph nodes: Secondary | ICD-10-CM

## 2019-05-01 DIAGNOSIS — K2289 Other specified disease of esophagus: Secondary | ICD-10-CM

## 2019-05-01 DIAGNOSIS — I10 Essential (primary) hypertension: Secondary | ICD-10-CM

## 2019-05-01 DIAGNOSIS — K228 Other specified diseases of esophagus: Secondary | ICD-10-CM

## 2019-05-01 DIAGNOSIS — I739 Peripheral vascular disease, unspecified: Secondary | ICD-10-CM

## 2019-05-01 LAB — COMPREHENSIVE METABOLIC PANEL
ALT: 15 U/L (ref 0–44)
AST: 15 U/L (ref 15–41)
Albumin: 3.3 g/dL — ABNORMAL LOW (ref 3.5–5.0)
Alkaline Phosphatase: 60 U/L (ref 38–126)
Anion gap: 9 (ref 5–15)
BUN: 5 mg/dL — ABNORMAL LOW (ref 6–20)
CO2: 25 mmol/L (ref 22–32)
Calcium: 9.2 mg/dL (ref 8.9–10.3)
Chloride: 102 mmol/L (ref 98–111)
Creatinine, Ser: 0.75 mg/dL (ref 0.61–1.24)
GFR calc Af Amer: >60 mL/min (ref >60)
GFR calc non Af Amer: >60 mL/min (ref >60)
Glucose, Bld: 86 mg/dL (ref 70–99)
Potassium: 3.9 mmol/L (ref 3.5–5.1)
Sodium: 136 mmol/L (ref 135–145)
Total Bilirubin: 0.8 mg/dL (ref 0.3–1.2)
Total Protein: 5.9 g/dL — ABNORMAL LOW (ref 6.5–8.1)

## 2019-05-01 LAB — CBC
HCT: 47.3 % (ref 39.0–52.0)
Hemoglobin: 15.6 g/dL (ref 13.0–17.0)
MCH: 31.8 pg (ref 26.0–34.0)
MCHC: 33 g/dL (ref 30.0–36.0)
MCV: 96.5 fL (ref 80.0–100.0)
Platelets: 273 10*3/uL (ref 150–400)
RBC: 4.9 MIL/uL (ref 4.22–5.81)
RDW: 12.7 % (ref 11.5–15.5)
WBC: 5.2 10*3/uL (ref 4.0–10.5)
nRBC: 0 % (ref 0.0–0.2)

## 2019-05-01 LAB — SURGICAL PATHOLOGY

## 2019-05-01 LAB — TSH: TSH: 1.152 u[IU]/mL (ref 0.350–4.500)

## 2019-05-01 MED ORDER — AMLODIPINE BESYLATE 5 MG PO TABS
5.0000 mg | ORAL_TABLET | Freq: Every day | ORAL | Status: DC
  Administered 2019-05-01 – 2019-05-02 (×2): 5 mg via ORAL
  Filled 2019-05-01 (×2): qty 1

## 2019-05-01 MED ORDER — HYDRALAZINE HCL 20 MG/ML IJ SOLN
5.0000 mg | Freq: Three times a day (TID) | INTRAMUSCULAR | Status: DC
  Administered 2019-05-01: 5 mg via INTRAVENOUS
  Filled 2019-05-01: qty 1

## 2019-05-01 MED ORDER — LORAZEPAM 2 MG/ML IJ SOLN
1.0000 mg | INTRAMUSCULAR | Status: DC | PRN
  Administered 2019-05-02: 2 mg via INTRAVENOUS
  Filled 2019-05-01: qty 1

## 2019-05-01 MED ORDER — FOLIC ACID 1 MG PO TABS
1.0000 mg | ORAL_TABLET | Freq: Every day | ORAL | Status: DC
  Administered 2019-05-01 – 2019-05-02 (×2): 1 mg via ORAL
  Filled 2019-05-01 (×2): qty 1

## 2019-05-01 MED ORDER — ADULT MULTIVITAMIN W/MINERALS CH
1.0000 | ORAL_TABLET | Freq: Every day | ORAL | Status: DC
  Administered 2019-05-01 – 2019-05-02 (×2): 1 via ORAL
  Filled 2019-05-01 (×2): qty 1

## 2019-05-01 MED ORDER — HYDROXYZINE HCL 10 MG/5ML PO SYRP
10.0000 mg | ORAL_SOLUTION | Freq: Three times a day (TID) | ORAL | Status: DC | PRN
  Administered 2019-05-01 – 2019-05-02 (×3): 10 mg via ORAL
  Filled 2019-05-01 (×4): qty 5

## 2019-05-01 MED ORDER — THIAMINE HCL 100 MG/ML IJ SOLN: 100.0000 mg | Freq: Every day | INTRAMUSCULAR | Status: DC

## 2019-05-01 MED ORDER — LORAZEPAM 1 MG PO TABS
1.0000 mg | ORAL_TABLET | ORAL | Status: DC | PRN
  Administered 2019-05-01: 1 mg via ORAL
  Filled 2019-05-01: qty 1

## 2019-05-01 MED ORDER — THIAMINE HCL 100 MG PO TABS
100.0000 mg | ORAL_TABLET | Freq: Every day | ORAL | Status: DC
  Administered 2019-05-01 – 2019-05-02 (×2): 100 mg via ORAL
  Filled 2019-05-01 (×2): qty 1

## 2019-05-01 MED ORDER — IOHEXOL 300 MG/ML  SOLN
100.0000 mL | Freq: Once | INTRAMUSCULAR | Status: AC | PRN
  Administered 2019-05-01: 100 mL via INTRAVENOUS

## 2019-05-01 NOTE — TOC Initial Note (Signed)
Transition of Care [facility]) - Initial/Assessment Note    Patient Details  Name: Ralph [name] MRN: [id number] Date of Birth: [birth year]  Transition of [facility]) CM/SW Contact:    Pollie Friar, RN Phone Number: 05/01/2019, 3:16 PM  Clinical Narrative:                 Pt is from home with spouse. He has access to walker, wheelchair and cane.  No f/u per PT/OT.  Pt is active with Family medicine for PCP. Pt uses [facility] in [place] that tries to keep his cost down.  Pt may need MATCH at d/c depending on medications. TOC following.  Expected Discharge Plan: Home/Self Care Barriers to Discharge: Inadequate or no insurance, Continued Medical Work up   Patient Goals and CMS Choice        Expected Discharge Plan and Services Expected Discharge Plan: Home/Self Care   Discharge Planning Services: CM Consult   Living arrangements for the past 2 months: Single Family Home                                      Prior Living Arrangements/Services Living arrangements for the past 2 months: Single Family Home Lives with:: Spouse Patient language and need for interpreter reviewed:: Yes Do you feel safe going back to the place where you live?: Yes      Need for Family Participation in Patient Care: Yes (Comment) Care giver support system in place?: Yes (comment)   Criminal Activity/Legal Involvement Pertinent to Current Situation/Hospitalization: No - Comment as needed  Activities of Daily Living Home Assistive Devices/Equipment: Blood pressure cuff, Hand-held shower hose ADL Screening (condition at time of admission) Patient's cognitive ability adequate to safely complete daily activities?: Yes Is the patient deaf or have difficulty hearing?: No Does the patient have difficulty seeing, even when wearing glasses/contacts?: No Does the patient have difficulty concentrating, remembering, or making decisions?: No Patient able to express need for assistance with  ADLs?: Yes Does the patient have difficulty dressing or bathing?: No Independently performs ADLs?: Yes (appropriate for developmental age) Does the patient have difficulty walking or climbing stairs?: Yes Weakness of Legs: Left(old stroke) Weakness of Arms/Hands: Left(old stroke)  Permission Sought/Granted                  Emotional Assessment Appearance:: Appears stated age Attitude/Demeanor/Rapport: Engaged Affect (typically observed): Accepting Orientation: : Oriented to Self, Oriented to Place, Oriented to  Time, Oriented to Situation   Psych Involvement: No (comment)  Admission diagnosis:  Hyponatremia [E87.1] Weight loss [R63.4] Dysphagia [R13.10] Dysphagia, unspecified type [R13.10] Patient Active Problem List   Diagnosis Date Noted  . 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 Esophageal mass 05/01/2019  . Dysphagia   . Weight loss   . Hyponatremia   . Cachexia ([place])   . Tobacco use disorder   . Neuropathic pain 08/29/2017  . High risk social situation 08/29/2017  . 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  . Pure hypercholesterolemia 11/26/2012  . Cerebrovascular disease 11/25/2012  . Peripheral vascular disease, unspecified ([place]) 11/25/2012  . Coronary artery disease 11/25/2012  . Tobacco abuse counseling 11/25/2012  . Essential hypertension, benign 11/25/2012   PCP:  Zenia Resides, MD Pharmacy:   [place], [place] - [address] [place] [address] Phone: [phone] Fax: [phone]  [facility] [phone] [place]  [facility] - [place], [place] - [address] [place] [address] Phone: [phone] Fax: [phone]  [id number] [place], [place] - [address] [place] [address] Phone: [phone] Fax: [phone]  [facility] [place], [place] - [place] [address] [address] Phone: [phone] Fax:  [phone]     Social Determinants of Health (SDOH) Interventions    Readmission Risk Interventions No flowsheet data found.

## 2019-05-01 NOTE — Progress Notes (Signed)
Family Medicine Teaching Service Daily Progress Note Intern Pager: [phone]  Patient name: Ralph [name] Medical record number: [id number] Date of birth: 1965/07/13 Age: 54 y.o. Gender: male  Primary Care Provider: Zenia Resides, MD Consultants: GI Code Status: Full code  Pt Overview and Major Events to Date:  3/23 patient admitted for upper GI evaluation  Assessment and Plan: Ralph [name] is a 54 y.o. male presenting with dysphagia, regurgitation and weight loss. PMH is significant for Tobacco Use Disorder, Hx CVA, CAD, HTN, and HLD.   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  Per GI note they spoke with pathology who reported that the biopsy showed Barrett's esophagus without malignancy but there is still concern for cancer and may be sampling error.  Recommend EUS if feasible versus repeat EGD with additional biopsies in the near future.  Additional diagnostic work-up outpatient if he is discharged this weekend.  Patient would like to leave this weekend. -GI following  Dr. Michail Sermon, appreciate recommendations -Clear liquid diet and advance to full liquid tomorrow if tolerating -IV Protonix every 12 hours -Follow-up on CT results -Follow-up on biopsy results -Vitals per floor routine -Up with assistance -Discontinue normal saline -Pain control with oxycodone 5-10 mg every 4 hours as needed for moderate to severe pain.  Tylenol solution every 4 hours as needed for mild to moderate pain -AM CBC, BMP -PT/OT no follow-up  Bradycardia Patient's pulse rate has been bradycardic since admission.  Range over the last 24 hours has been from 39-56.  Unsure of etiology at this time.  TSH was 1.152 -Continuous cardiac monitoring  Weight loss, unintentional Patient usually weighs 160-170  lb, now weighs 130lb. Appears cachetic on examination.   -Nutrition consulted appreciate recommendations -Ensure shakes post EGD -Add Ensure to Diet to maintain nutrition once no longer n.p.o. -Consider Dietician consult  Hyponatremia Na  134 this morning.  It was 131 on admission.  No previous labs on file to determine baseline. Most likely due to poor PO intake.  -Continue to monitor with BMP -Continue normal saline at 100 mL/h   HTN Blood pressures have been elevated over the last 24 hours ranging from 98/71-141/90.  With most recent being 141/90.  Not on known anithypertensive.  Patient was started on 5 mg Norvasc. -Monitor BPs  Hyperlipidemia Lipid panel 2014: Cholesterol 194, HDL 50, LDL 125, triglycerides 96, VLDL 19 Home meds: prescribed Atorvastatin 80mg . Patient has not been taking due to cost of medications and GI symptoms. Patient has personal Hx of CVA in 2018, strong family history of MI/CVA.  -Hold atorvastatin as n.p.o, restart as soon as able to tolerate PO  Neuropathic pain Home meds: Gabapentin 300 mg 3 times daily, patient has not been taking due to cost of medications and GI symptoms  -Hold gabapentin as n.p.o., may restart as soon as diet is added  CVA Stroke in 2018, has left-sided residual weakness Home meds: aspirin 81mg  and atorvastatin 80mg , patient has not been taking due to cost of medications and GI symptoms  -Hold aspirin 81mg  and Atorvastatin, may restart atorvastatin as soon as diet is given  Substance abuse Patient reports he drinks a 40 ounce beer nightly as well as uses Xanax occasionally. -CIWA protocol -Monitor for withdrawal  Tobacco Use disorder Patient smokes 1PPD since age 34.  - 21 mg  nicotine patch  FEN/GI: Clear liquids PPx: Lovenox  Disposition: Possibly tomorrow  Subjective:  Patient reports he had issues sleeping last night but got "a few hours" of sleep.  Says that his pain is being relatively well controlled although  towards the time where he is scheduled for next dose he does notice the pain increases.  Patient is concerned about the results of these biopsies and would like an update as soon as possible.  Objective: Temp:  [97.4 F (36.3 C)-98 F (36.7 C)] 97.5 F (36.4 C) (03/26 0450) Pulse Rate:  [39-56] 56 (03/26 0450) Resp:  [14-20] 18 (03/26 0450) BP: (96-175)/(73-102) 147/88 (03/26 0450) SpO2:  [98 %-100 %] 100 % (03/26 0450) Physical Exam: General: Sitting on bed when I enter the room, cachectic Cardiovascular: Bradycardic on exam, regular rhythm Respiratory: Clear to auscultation bilaterally, no wheezes noted Abdomen: Soft, bowel sounds positive, pain is well controlled Extremities: No edema noted  Laboratory: Recent Labs  Lab 04/29/19 0226 04/30/19 0245 05/01/19 0414  WBC 8.3 6.8 5.2  HGB 14.8 15.7 15.6  HCT 44.3 48.1 47.3  PLT 252 265 273   Recent Labs  Lab 04/29/19 0226 04/30/19 0245 05/01/19 0414  NA 130* 134* 136  K 3.8 3.9 3.9  CL 96* 98 102  CO2 26 24 25   BUN 7 7 5*  CREATININE 0.62 0.65 0.75  CALCIUM 8.9 9.1 9.2  PROT 5.8* 6.2* 5.9*  BILITOT 0.9 0.9 0.8  ALKPHOS 61 68 60  ALT 21 18 15   AST 24 21 15   GLUCOSE 100* 81 86   Troponin 2>2 PT-12.3 INR-0.9  Magnesium-2.0 Imaging/Diagnostic Tests: ECHOCARDIOGRAM COMPLETE  Result Date: 04/30/2019    ECHOCARDIOGRAM REPORT   Patient Name:   Ralph [name] Date of Exam: 04/30/2019 Medical Rec #:  [id number]/02/2019    Height:       69.0 in Accession #:    [id number]   Weight:       134.0 lb Date of Birth:  1965/08/07    BSA:          1.743 m Patient Age:    53 years     BP:           165/96 mmHg Patient Gender: M            HR:           44 bpm. Exam Location:  Inpatient Procedure: 2D Echo, Color Doppler and Cardiac Doppler Indications:    R94.31 Abnormal EKG  History:        Patient has no prior history of Echocardiogram examinations.                 CAD; Risk Factors:Hypertension and Dyslipidemia.  Sonographer:    [id number] Senior  RDCS Referring Phys: [phone] CARINA M BROWN IMPRESSIONS  1. Normal LV systolic function; grade 1 diastolic dysfunction.  2. Left ventricular ejection fraction, by estimation, is 60 to 65%. The left ventricle has normal function. The left ventricle has no regional wall motion abnormalities. Left ventricular diastolic parameters are consistent with Grade I diastolic dysfunction (impaired relaxation).  3. Right ventricular systolic function is normal. The right ventricular size is normal.  4. The mitral valve is normal in structure. No evidence of mitral valve regurgitation. No evidence of mitral stenosis.  5. The aortic valve was not well visualized. Aortic valve regurgitation is not visualized. No aortic stenosis is present.  6. The inferior vena cava is normal in size with greater than 50% respiratory  variability, suggesting right atrial pressure of 3 mmHg. FINDINGS  Left Ventricle: Left ventricular ejection fraction, by estimation, is 60 to 65%. The left ventricle has normal function. The left ventricle has no regional wall motion abnormalities. The left ventricular internal cavity size was normal in size. There is  no left ventricular hypertrophy. Left ventricular diastolic parameters are consistent with Grade I diastolic dysfunction (impaired relaxation). Right Ventricle: The right ventricular size is normal. Right ventricular systolic function is normal. Left Atrium: Left atrial size was normal in size. Right Atrium: Right atrial size was normal in size. Pericardium: There is no evidence of pericardial effusion. Mitral Valve: The mitral valve is normal in structure. Normal mobility of the mitral valve leaflets. No evidence of mitral valve regurgitation. No evidence of mitral valve stenosis. Tricuspid Valve: The tricuspid valve is normal in structure. Tricuspid valve regurgitation is not demonstrated. No evidence of tricuspid stenosis. Aortic Valve: The aortic valve was not well visualized. Aortic valve  regurgitation is not visualized. No aortic stenosis is present. Pulmonic Valve: The pulmonic valve was not well visualized. Pulmonic valve regurgitation is not visualized. No evidence of pulmonic stenosis. Aorta: The aortic root is normal in size and structure. Venous: The inferior vena cava is normal in size with greater than 50% respiratory variability, suggesting right atrial pressure of 3 mmHg.  Additional Comments: Normal LV systolic function; grade 1 diastolic dysfunction.  LEFT VENTRICLE PLAX 2D LVIDd:         3.50 cm  Diastology LVIDs:         2.40 cm  LV e' lateral:   9.86 cm/s LV PW:         0.90 cm  LV E/e' lateral: 4.9 LV IVS:        1.00 cm  LV e' medial:    7.42 cm/s LVOT diam:     2.00 cm  LV E/e' medial:  6.6 LV SV:         95 LV SV Index:   54 LVOT Area:     3.14 cm  RIGHT VENTRICLE RV S prime:     14.60 cm/s TAPSE (M-mode): 2.1 cm LEFT ATRIUM             Index       RIGHT ATRIUM           Index LA diam:        2.90 cm 1.66 cm/m  RA Area:     13.20 cm LA Vol (A2C):   40.7 ml 23.35 ml/m RA Volume:   31.80 ml  18.25 ml/m LA Vol (A4C):   40.8 ml 23.41 ml/m LA Biplane Vol: 43.5 ml 24.96 ml/m  AORTIC VALVE LVOT Vmax:   113.00 cm/s LVOT Vmean:  76.600 cm/s LVOT VTI:    0.301 m  AORTA Ao Root diam: 3.50 cm Ao Asc diam:  3.70 cm MITRAL VALVE MV Area (PHT): 1.71 cm    SHUNTS MV Decel Time: 443 msec    Systemic VTI:  0.30 m MV E velocity: 48.70 cm/s  Systemic Diam: 2.00 cm MV A velocity: 62.20 cm/s MV E/A ratio:  0.78 Olga Millers MD Electronically signed by Olga Millers MD Signature Date/Time: 04/30/2019/3:17:56 PM    Final     Derrel Nip, MD 05/01/2019, 5:54 AM PGY-1, [facility] FPTS Intern pager: [phone], text pages welcome

## 2019-05-01 NOTE — Progress Notes (Signed)
[facility] Gastroenterology Progress Note  Ralph [name] y.o. 1966-01-29   Subjective: Tolerating ice chips. Denies abdominal pain, nausea, or vomiting. Reports BM yesterday. Wants liquid diet. Wants to go home.  Objective: Vital signs: Vitals:   05/01/19 0450 05/01/19 0813  BP: (!) 147/88 98/71  Pulse: (!) 56 63  Resp: 18 18  Temp: (!) 97.5 F (36.4 C) 98 F (36.7 C)  SpO2: 100% 100%    Physical Exam: Gen: thin, alert, no acute distress  HEENT: anicteric sclera CV: RRR Chest: CTA B Abd: soft, nontender, nondistended, +BS Ext: no edema  Lab Results: Recent Labs    04/29/19 0226 04/29/19 0227 04/30/19 0245 05/01/19 0414  NA   < >  --  134* 136  K   < >  --  3.9 3.9  CL   < >  --  98 102  CO2   < >  --  24 25  GLUCOSE   < >  --  81 86  BUN   < >  --  7 5*  CREATININE   < >  --  0.65 0.75  CALCIUM   < >  --  9.1 9.2  MG  --  2.0  --   --    < > = values in this interval not displayed.   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 biopsies showed Barrett's esophagus without malignancy per discussion with path. Very concerned that cancer is present but may be sampling error. Would benefit from an EUS if that is feasible with his ulcerative esophagitis vs repeat EGD with additional biopsies in near future. Start clear liquid diet and advance to full liquids tomorrow if tolerates. Will need additional diagnostic work up as outpt if he is discharged this weekend. Dr. Evette Cristal from [facility] will f/u tomorrow.   Ralph [name] 05/01/2019, 11:30 AM  Questions please call [phone] ID: Ralph [name], male   DOB: May 26, 1965, 54 y.o.    MRN: [id number]

## 2019-05-02 DIAGNOSIS — Z598 Other problems related to housing and economic circumstances: Secondary | ICD-10-CM

## 2019-05-02 DIAGNOSIS — Z5989 Other problems related to housing and economic circumstances: Secondary | ICD-10-CM

## 2019-05-02 LAB — COMPREHENSIVE METABOLIC PANEL
ALT: 13 U/L (ref 0–44)
AST: 14 U/L — ABNORMAL LOW (ref 15–41)
Albumin: 2.9 g/dL — ABNORMAL LOW (ref 3.5–5.0)
Alkaline Phosphatase: 53 U/L (ref 38–126)
Anion gap: 11 (ref 5–15)
BUN: <5 mg/dL — ABNORMAL LOW (ref 6–20)
CO2: 23 mmol/L (ref 22–32)
Calcium: 9 mg/dL (ref 8.9–10.3)
Chloride: 104 mmol/L (ref 98–111)
Creatinine, Ser: 0.76 mg/dL (ref 0.61–1.24)
GFR calc Af Amer: >60 mL/min (ref >60)
GFR calc non Af Amer: >60 mL/min (ref >60)
Glucose, Bld: 82 mg/dL (ref 70–99)
Potassium: 3.4 mmol/L — ABNORMAL LOW (ref 3.5–5.1)
Sodium: 138 mmol/L (ref 135–145)
Total Bilirubin: 0.8 mg/dL (ref 0.3–1.2)
Total Protein: 5.2 g/dL — ABNORMAL LOW (ref 6.5–8.1)

## 2019-05-02 LAB — CBC
HCT: 43 % (ref 39.0–52.0)
Hemoglobin: 14.4 g/dL (ref 13.0–17.0)
MCH: 31.9 pg (ref 26.0–34.0)
MCHC: 33.5 g/dL (ref 30.0–36.0)
MCV: 95.1 fL (ref 80.0–100.0)
Platelets: 254 10*3/uL (ref 150–400)
RBC: 4.52 MIL/uL (ref 4.22–5.81)
RDW: 12.7 % (ref 11.5–15.5)
WBC: 4.4 10*3/uL (ref 4.0–10.5)
nRBC: 0 % (ref 0.0–0.2)

## 2019-05-02 MED ORDER — PANTOPRAZOLE SODIUM 40 MG PO TBEC: 40.0000 mg | DELAYED_RELEASE_TABLET | Freq: Two times a day (BID) | ORAL | 1 refills | Status: DC

## 2019-05-02 MED ORDER — ATORVASTATIN CALCIUM 80 MG PO TABS: 80.0000 mg | ORAL_TABLET | Freq: Every day | ORAL | 0 refills | Status: DC

## 2019-05-02 MED ORDER — ATORVASTATIN CALCIUM 80 MG PO TABS
80.0000 mg | ORAL_TABLET | Freq: Every day | ORAL | Status: DC
  Administered 2019-05-02: 80 mg via ORAL
  Filled 2019-05-02: qty 1

## 2019-05-02 MED ORDER — OXYCODONE-ACETAMINOPHEN 5-325 MG PO TABS: 1.0000 | ORAL_TABLET | Freq: Three times a day (TID) | ORAL | 0 refills | Status: DC | PRN

## 2019-05-02 MED ORDER — AMLODIPINE BESYLATE 5 MG PO TABS: 5.0000 mg | ORAL_TABLET | Freq: Every day | ORAL | 0 refills | Status: DC

## 2019-05-02 NOTE — Progress Notes (Addendum)
Patient given AVS with education provided.  Telemetry discontinued, peripheral IV removed.  Pt awaiting significant other to pick him up for discharge.  Medications sent the [facility].  Report given to [place], RN on night shift.

## 2019-05-02 NOTE — Progress Notes (Signed)
As per Dr. Marge Duncans note, the patient needs to follow-up with Dr. Dulce Sellar for outpatient EUS.  Call us if needed.

## 2019-05-02 NOTE — Progress Notes (Signed)
Patient was discharged by day shift RN, given all information and instructions. Patients ride did not arrive until after night shift report. Patients left floor in wheelchair, taken down to ride at 1930.

## 2019-05-02 NOTE — Discharge Instructions (Signed)
Thank you for take care of you.  Please see the following instructions after you are discharged home:  1.  As your body continues to heal and you continue to improve your eating, we highly recommend that you abstain from drinking alcohol or taking any benzos, including Valium or Xanax, for anxiety.  DO NOT take medications that are not prescribed to you, including xanax.  Instead, we recommend a selective serotonin reuptake inhibitor to help control your anxiety. 2. You will not be receiving long term opioids.  We have prescribed 10 tablets of oxycodone for you to use for SEVERE pain.  Please call GI first thing Monday morning to schedule an appointment for next week and ensure that you follow up with the [facility] on Monday.  If you have worsening in pain, worsening of ability to tolerate a diet, chest pain, dizziness, you should be seen in the Emergency Department.  It was our pleasure to serve you!

## 2019-05-02 NOTE — Progress Notes (Signed)
MES 3, HR 50, SBP 100/68 (95), LOC responds to voice.  Pt assessed at bedside. Now alert that fully awake, HR 50-72, BP 113/78 (89)

## 2019-05-02 NOTE — Progress Notes (Addendum)
Family Medicine Teaching Service Daily Progress Note Intern Pager: [phone]  Patient name: Ralph [name] Ognibene Medical record number: [id number] Date of birth: [birth year] Age: 54 y.o. Gender: male  Primary Care Provider: Moses MannersHensel, William A, MD Consultants: GI Code Status: Full code  Pt Overview and Major Events to Date:  3/23 patient admitted for upper GI evaluation 3/24 EGD with biopsy 3/26 patient tolerating clears PO 3/27 patient advanced to full liquid diet  Assessment and Plan: Ralph [name] Dilmore is a 54 y.o. male presenting with dysphagia, regurgitation and weight loss. PMH is significant for Tobacco Use Disorder, Hx CVA, CAD, HTN, and HLD.   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  Per GI note they spoke with pathology who reported that the biopsy showed Barrett's esophagus without malignancy but there is still concern for cancer and may be sampling error.  Recommend EUS if feasible versus repeat EGD with additional biopsies in the near future.  Additional diagnostic work-up outpatient if he is discharged this weekend.  Patient would like to leave this weekend. -GI following  Dr. Bosie ClosSchooler, appreciate recommendations -Full liquid diet starting today 3/27, if patient can tolerate can discharge home -IV Protonix every 12 hours -Vitals per floor routine -Up with assistance -IV normal saline 50 cc/hour -Pain control with oxycodone 5-10 mg every 4 hours as needed for moderate to severe pain.  Tylenol solution every 4 hours as needed for mild to moderate pain -PT/OT no follow-up  Bradycardia, stable Patient's pulse rate has been bradycardic since admission.  Range over the last 24 hours has been from 39-56.  Unsure of etiology at this time.  TSH was 1.152 -Continuous cardiac  monitoring  Weight loss, unintentional Patient usually weighs 160-170 lb, now weighs 130lb. Appears cachetic on examination.   -Nutrition consulted appreciate recommendations -Ensure shakes  HTN Blood pressures 98-159/71-103 overnight, most recently 120/84 (3/27).  Patient was started on 5 mg Norvasc. -Monitor BPs -Continue amlodipine 5 mg  Hyperlipidemia Lipid panel 2014: Cholesterol 194, HDL 50, LDL 125, triglycerides 96, VLDL 19 Home meds: prescribed Atorvastatin 80mg . Patient has not been taking due to cost of medications and GI symptoms. Patient has personal Hx of CVA in 2018, strong family history of MI/CVA.  -Restarting atorvastatin 80  CVA Stroke in 2018, has left-sided residual weakness Home meds: aspirin 81mg  and atorvastatin 80mg , patient has not been taking due to cost of medications and GI symptoms  -Hold aspirin 81mg   -Restart atorvastatin 80 mg daily  Substance abuse Patient reports he drinks a 40 ounce beer nightly as well as uses Xanax occasionally.  CIWA is overnight 0, despite this patient still received 2 mg IV Ativan this a.m. 3/27. -CIWA protocol -Monitor for withdrawal  Tobacco Use disorder Patient smokes 1PPD since age 10013.  - 21 mg nicotine patch  Hyponatremia, resolved Na 138, was 131 on admission. Most likely due to poor PO intake.  -Continue to monitor with BMP -Continue normal saline at 50 mL/h -Patient continuing to increase dietary intake  Neuropathic pain Home meds: Gabapentin 300 mg 3 times daily, patient has not been taking due to cost of medications and GI symptoms  -Patient has not taken gabapentin in a long time, patient denies neuropathic pains  FEN/GI: Clear liquids PPx: Lovenox  Disposition: Possible DC home today 3/27  Subjective:  Patient seen sleeping comfortably  this morning, states he was able to tolerate clears well yesterday, which made him very excited.  Is happy to hear that the biopsy did not show any  malignancy.  Objective: Temp:  [97.6 F (36.4 C)-98 F (36.7 C)] 98 F (36.7 C) (03/27 0400) Pulse Rate:  [50-63] 51 (03/27 0400) Resp:  [18] 18 (03/27 0400) BP: (98-159)/(71-103) 120/84 (03/27 0400) SpO2:  [97 %-100 %] 97 % (03/27 0400) Physical Exam: General: Seen resting in bed comfortably, cachectic appearing Cardiovascular: Bradycardic on exam, regular rhythm, no murmurs appreciated Respiratory: CTA bilaterally, comfortable work of breathing Abdomen: Normal bowel sounds appreciated, soft, nontender to palpation Extremities: No edema, deformity, cyanosis  Laboratory: Recent Labs  Lab 04/30/19 0245 05/01/19 0414 05/02/19 0441  WBC 6.8 5.2 4.4  HGB 15.7 15.6 14.4  HCT 48.1 47.3 43.0  PLT 265 273 254   Recent Labs  Lab 04/29/19 0226 04/30/19 0245 05/01/19 0414  NA 130* 134* 136  K 3.8 3.9 3.9  CL 96* 98 102  CO2 26 24 25   BUN 7 7 5*  CREATININE 0.62 0.65 0.75  CALCIUM 8.9 9.1 9.2  PROT 5.8* 6.2* 5.9*  BILITOT 0.9 0.9 0.8  ALKPHOS 61 68 60  ALT 21 18 15   AST 24 21 15   GLUCOSE 100* 81 86   TSH nml Esophageal biopsy negative for dysplasia or malignancy, recommend repeating biopsy of lesion worrisome clinically  Imaging/Diagnostic Tests: CT CHEST [name] CONTRAST  Result Date: 05/01/2019 CLINICAL DATA:  Esophageal mass. Erosive esophagitis. EXAM: CT CHEST, ABDOMEN, AND PELVIS WITH CONTRAST TECHNIQUE: Multidetector CT imaging of the chest, abdomen and pelvis was performed following the standard protocol during bolus administration of intravenous contrast. CONTRAST:  OMNIPAQUE IOHEXOL 300 MG/ML  SOLN COMPARISON:  10/08/2006 FINDINGS: CT CHEST FINDINGS Cardiovascular: Aorta is of normal caliber with minimal atherosclerosis. Mainly noncalcified plaque within the thoracic aorta. Central pulmonary vessels are unremarkable. Heart size is normal without pericardial effusion. Calcified coronary artery disease. Mediastinum/Nodes: Thickening of the distal esophagus and  gastroesophageal junction. Discrete mass is not visualized. Thickening extends from the mid through the distal esophagus. There is periesophageal stranding No hilar adenopathy. Mildly enlarged AP window lymph node with rounded morphology (image 22, series 3) 9 mm. Small lymph nodes track towards the thoracic inlet all less than a cm on the right along the paratracheal chain. No retrocrural adenopathy, axillary lymphadenopathy or thoracic inlet adenopathy though there is a small rounded node just below the thoracic inlet (image 9, series 3) Lungs/Pleura: Paraseptal and centrilobular emphysematous changes, moderate to marked with paraseptal predominance and with changes that are worse towards the lung apices basilar atelectasis. No consolidation or pleural effusion. Airways are patent. No suspicious mass or nodule. Musculoskeletal: No chest wall mass. No acute bone finding or destructive bone process in the thorax. CT ABDOMEN PELVIS FINDINGS Hepatobiliary: No focal, suspicious hepatic lesion. Mild biliary ductal dilation of extrahepatic biliary tree. No pericholecystic stranding. Pancreas: No pancreatic ductal dilation, mass or inflammation. Spleen: Spleen is normal size without focal lesion. Adrenals/Urinary Tract: Adrenal glands are normal. Kidneys enhance symmetrically without focal lesion. No hydronephrosis. Urinary bladder is normal. Stomach/Bowel: Thickening of the gastric cardia. Paucity of intra-abdominal mesenteric fat limiting assessment of individual bowel loops. No acute bowel process. Stool throughout much of the colon. Vascular/Lymphatic: Atheromatous plaque in the abdominal aorta. Plaque is both calcified and noncalcified and without sign of aneurysm. No aneurysm. Small lymph node in the hepatic gastric ligament (image 58, series 3) 1 cm. Reproductive: No pelvic  adenopathy. Prostate with some heterogeneity. No pelvic adenopathy. Other: No abdominal wall hernia or abnormality. No abdominopelvic ascites.  Musculoskeletal: No acute bone finding or destructive bone process. Spinal degenerative changes. IMPRESSION: 1. Marked irregular thickening of the distal esophagus and gastroesophageal junction with periesophageal stranding. Discrete mass is not visualized. Mildly enlarged but rounded lymph nodes in the mediastinum in the current setting or suspicious. There also numerous small paraesophageal lymph nodes along the left lateral margin of the esophagus that display abnormal enhancement though are only 3-4 mm in size, not mentioned above. 2. Mediastinal lymph nodes though small are suspicious given esophageal findings particularly given the enlargement of a hepatic gastric lymph node below the diaphragm. PET scan may be helpful for further evaluation. 3. Moderate to marked paraseptal and centrilobular emphysematous changes of the lungs. 4. Atheromatous plaque in the thoracic aorta and coronary arteries. Aortic Atherosclerosis (ICD10-I70.0) and Emphysema (ICD10-J43.9). Electronically Signed   By: Donzetta Kohut M.D.   On: 05/01/2019 09:14   CT ABDOMEN PELVIS [name] CONTRAST  Result Date: 05/01/2019 CLINICAL DATA:  Esophageal mass. Erosive esophagitis. EXAM: CT CHEST, ABDOMEN, AND PELVIS WITH CONTRAST TECHNIQUE: Multidetector CT imaging of the chest, abdomen and pelvis was performed following the standard protocol during bolus administration of intravenous contrast. CONTRAST:  OMNIPAQUE IOHEXOL 300 MG/ML  SOLN COMPARISON:  10/08/2006 FINDINGS: CT CHEST FINDINGS Cardiovascular: Aorta is of normal caliber with minimal atherosclerosis. Mainly noncalcified plaque within the thoracic aorta. Central pulmonary vessels are unremarkable. Heart size is normal without pericardial effusion. Calcified coronary artery disease. Mediastinum/Nodes: Thickening of the distal esophagus and gastroesophageal junction. Discrete mass is not visualized. Thickening extends from the mid through the distal esophagus. There is periesophageal  stranding No hilar adenopathy. Mildly enlarged AP window lymph node with rounded morphology (image 22, series 3) 9 mm. Small lymph nodes track towards the thoracic inlet all less than a cm on the right along the paratracheal chain. No retrocrural adenopathy, axillary lymphadenopathy or thoracic inlet adenopathy though there is a small rounded node just below the thoracic inlet (image 9, series 3) Lungs/Pleura: Paraseptal and centrilobular emphysematous changes, moderate to marked with paraseptal predominance and with changes that are worse towards the lung apices basilar atelectasis. No consolidation or pleural effusion. Airways are patent. No suspicious mass or nodule. Musculoskeletal: No chest wall mass. No acute bone finding or destructive bone process in the thorax. CT ABDOMEN PELVIS FINDINGS Hepatobiliary: No focal, suspicious hepatic lesion. Mild biliary ductal dilation of extrahepatic biliary tree. No pericholecystic stranding. Pancreas: No pancreatic ductal dilation, mass or inflammation. Spleen: Spleen is normal size without focal lesion. Adrenals/Urinary Tract: Adrenal glands are normal. Kidneys enhance symmetrically without focal lesion. No hydronephrosis. Urinary bladder is normal. Stomach/Bowel: Thickening of the gastric cardia. Paucity of intra-abdominal mesenteric fat limiting assessment of individual bowel loops. No acute bowel process. Stool throughout much of the colon. Vascular/Lymphatic: Atheromatous plaque in the abdominal aorta. Plaque is both calcified and noncalcified and without sign of aneurysm. No aneurysm. Small lymph node in the hepatic gastric ligament (image 58, series 3) 1 cm. Reproductive: No pelvic adenopathy. Prostate with some heterogeneity. No pelvic adenopathy. Other: No abdominal wall hernia or abnormality. No abdominopelvic ascites. Musculoskeletal: No acute bone finding or destructive bone process. Spinal degenerative changes. IMPRESSION: 1. Marked irregular thickening of the  distal esophagus and gastroesophageal junction with periesophageal stranding. Discrete mass is not visualized. Mildly enlarged but rounded lymph nodes in the mediastinum in the current setting or suspicious. There also numerous small paraesophageal lymph  nodes along the left lateral margin of the esophagus that display abnormal enhancement though are only 3-4 mm in size, not mentioned above. 2. Mediastinal lymph nodes though small are suspicious given esophageal findings particularly given the enlargement of a hepatic gastric lymph node below the diaphragm. PET scan may be helpful for further evaluation. 3. Moderate to marked paraseptal and centrilobular emphysematous changes of the lungs. 4. Atheromatous plaque in the thoracic aorta and coronary arteries. Aortic Atherosclerosis (ICD10-I70.0) and Emphysema (ICD10-J43.9). Electronically Signed   By: Zetta Bills M.D.   On: 05/01/2019 09:14   ECHOCARDIOGRAM COMPLETE  Result Date: 04/30/2019    ECHOCARDIOGRAM REPORT   Patient Name:   Ralph [name] [facility] Date of Exam: 04/30/2019 Medical Rec #:  [id number]    Height:       69.0 in Accession #:    [id number]   Weight:       134.0 lb Date of Birth:  [birth year]    BSA:          1.743 m Patient Age:    35 years     BP:           165/96 mmHg Patient Gender: M            HR:           44 bpm. Exam Location:  Inpatient Procedure: 2D Echo, Color Doppler and Cardiac Doppler Indications:    R94.31 Abnormal EKG  History:        Patient has no prior history of Echocardiogram examinations.                 CAD; Risk Factors:Hypertension and Dyslipidemia.  Sonographer:    Raquel Sarna Senior RDCS Referring Phys: [id number] CARINA M BROWN IMPRESSIONS  1. Normal LV systolic function; grade 1 diastolic dysfunction.  2. Left ventricular ejection fraction, by estimation, is 60 to 65%. The left ventricle has normal function. The left ventricle has no regional wall motion abnormalities. Left ventricular diastolic parameters are consistent with Grade I  diastolic dysfunction (impaired relaxation).  3. Right ventricular systolic function is normal. The right ventricular size is normal.  4. The mitral valve is normal in structure. No evidence of mitral valve regurgitation. No evidence of mitral stenosis.  5. The aortic valve was not well visualized. Aortic valve regurgitation is not visualized. No aortic stenosis is present.  6. The inferior vena cava is normal in size with greater than 50% respiratory variability, suggesting right atrial pressure of 3 mmHg. FINDINGS  Left Ventricle: Left ventricular ejection fraction, by estimation, is 60 to 65%. The left ventricle has normal function. The left ventricle has no regional wall motion abnormalities. The left ventricular internal cavity size was normal in size. There is  no left ventricular hypertrophy. Left ventricular diastolic parameters are consistent with Grade I diastolic dysfunction (impaired relaxation). Right Ventricle: The right ventricular size is normal. Right ventricular systolic function is normal. Left Atrium: Left atrial size was normal in size. Right Atrium: Right atrial size was normal in size. Pericardium: There is no evidence of pericardial effusion. Mitral Valve: The mitral valve is normal in structure. Normal mobility of the mitral valve leaflets. No evidence of mitral valve regurgitation. No evidence of mitral valve stenosis. Tricuspid Valve: The tricuspid valve is normal in structure. Tricuspid valve regurgitation is not demonstrated. No evidence of tricuspid stenosis. Aortic Valve: The aortic valve was not well visualized. Aortic valve regurgitation is not visualized. No aortic stenosis is present. Pulmonic Valve: The pulmonic valve  was not well visualized. Pulmonic valve regurgitation is not visualized. No evidence of pulmonic stenosis. Aorta: The aortic root is normal in size and structure. Venous: The inferior vena cava is normal in size with greater than 50% respiratory variability,  suggesting right atrial pressure of 3 mmHg.  Additional Comments: Normal LV systolic function; grade 1 diastolic dysfunction.  LEFT VENTRICLE PLAX 2D LVIDd:         3.50 cm  Diastology LVIDs:         2.40 cm  LV e' lateral:   9.86 cm/s LV PW:         0.90 cm  LV E/e' lateral: 4.9 LV IVS:        1.00 cm  LV e' medial:    7.42 cm/s LVOT diam:     2.00 cm  LV E/e' medial:  6.6 LV SV:         95 LV SV Index:   54 LVOT Area:     3.14 cm  RIGHT VENTRICLE RV S prime:     14.60 cm/s TAPSE (M-mode): 2.1 cm LEFT ATRIUM             Index       RIGHT ATRIUM           Index LA diam:        2.90 cm 1.66 cm/m  RA Area:     13.20 cm LA Vol (A2C):   40.7 ml 23.35 ml/m RA Volume:   31.80 ml  18.25 ml/m LA Vol (A4C):   40.8 ml 23.41 ml/m LA Biplane Vol: 43.5 ml 24.96 ml/m  AORTIC VALVE LVOT Vmax:   113.00 cm/s LVOT Vmean:  76.600 cm/s LVOT VTI:    0.301 m  AORTA Ao Root diam: 3.50 cm Ao Asc diam:  3.70 cm MITRAL VALVE MV Area (PHT): 1.71 cm    SHUNTS MV Decel Time: 443 msec    Systemic VTI:  0.30 m MV E velocity: 48.70 cm/s  Systemic Diam: 2.00 cm MV A velocity: 62.20 cm/s MV E/A ratio:  0.78 Olga Millers MD Electronically signed by Olga Millers MD Signature Date/Time: 04/30/2019/3:17:56 PM    Final     Dollene Cleveland, DO 05/02/2019, 6:20 AM PGY-2, [facility] FPTS Intern pager: [phone], text pages welcome

## 2019-05-04 ENCOUNTER — Telehealth: Payer: Self-pay | Admitting: Family Medicine

## 2019-05-04 ENCOUNTER — Other Ambulatory Visit: Payer: Self-pay

## 2019-05-04 ENCOUNTER — Ambulatory Visit (INDEPENDENT_AMBULATORY_CARE_PROVIDER_SITE_OTHER): Payer: Self-pay | Admitting: Family Medicine

## 2019-05-04 VITALS — BP 118/84 | HR 58 | Ht 67.0 in | Wt 135.2 lb

## 2019-05-04 DIAGNOSIS — I10 Essential (primary) hypertension: Secondary | ICD-10-CM

## 2019-05-04 DIAGNOSIS — K228 Other specified diseases of esophagus: Secondary | ICD-10-CM

## 2019-05-04 DIAGNOSIS — R001 Bradycardia, unspecified: Secondary | ICD-10-CM

## 2019-05-04 DIAGNOSIS — Z9689 Presence of other specified functional implants: Secondary | ICD-10-CM | POA: Insufficient documentation

## 2019-05-04 DIAGNOSIS — K2289 Other specified disease of esophagus: Secondary | ICD-10-CM

## 2019-05-04 DIAGNOSIS — R0789 Other chest pain: Secondary | ICD-10-CM

## 2019-05-04 MED ORDER — CYCLOBENZAPRINE HCL 10 MG PO TABS: 10.0000 mg | ORAL_TABLET | Freq: Three times a day (TID) | ORAL | 0 refills | Status: DC | PRN

## 2019-05-04 MED ORDER — AMLODIPINE BESYLATE 5 MG PO TABS: 5.0000 mg | ORAL_TABLET | Freq: Every day | ORAL | 5 refills | Status: DC

## 2019-05-04 NOTE — Assessment & Plan Note (Signed)
High chance of malignancy given patient's symptoms and history.  Urgent referral placed to [facility] GI with Dr. Dulce Sellar.  Patient was advised to call us if he has not heard from their office within the week.

## 2019-05-04 NOTE — Assessment & Plan Note (Signed)
Controlled today on amlodipine 5 mg.  This was refilled.

## 2019-05-04 NOTE — Chronic Care Management (AMB) (Signed)
  Care Management   Note  05/04/2019 Name: Ralph [name] MRN: [id number] DOB: 12/12/1965  Ralph [name] is a 54 y.o. year old male who is a primary care patient of [name], [name], MD. I reached out to Antony Blackbird by phone today in response to a referral sent by [name]'s health plan.    [name] was given information about care management services today including:  1. Care management services include personalized support from designated clinical staff supervised by his physician, including individualized plan of care and coordination with other care providers 2. 24/7 contact phone numbers for assistance for urgent and routine care needs. 3. The patient may stop care management services at any time by phone call to the office staff.  Patient agreed to services and verbal consent obtained.   Follow up plan: Telephone appointment with care management team member scheduled for:05/05/2019  Elisha Ponder, LPN Health Advisor, Embedded Care Coordination [facility] Health Care Management ??[email or web address] ??[phone]

## 2019-05-04 NOTE — Progress Notes (Signed)
    SUBJECTIVE:   CHIEF COMPLAINT / HPI:   L sided chest wall pain Started while in the hospital after his EGD and biopsy Was prescribed Percocet on Saturday night at discharge which seemed to help Skin does not seem to hurt, no rash in the area  Hospital follow up Was admitted to the hospital last week due to dysphagia and was found to have an esophageal mass.  Biopsy of the mass was performed, which did not show evidence for malignancy.  However, patient has mediastinal lymphadenopathy and several risk factors for malignancy, so this has not been ruled out.  He was instructed to follow-up with Dr. Dulce Sellar at [facility].  He says that they called to make an appointment, but they were told that that clinic does not see self-pay patients without a referral from the PCP.  He was also noted to be hypertensive and bradycardic, with heart rate ranging from the 30s to the 50s.  He was started on Norvasc 5 mg prior to discharge and has been taking this as prescribed.  He thinks that the Ativan he was prescribed for his anxiety while hospitalized could have caused his slow heart rate.  He denies shortness of breath and lightheadedness.  He has been tolerated a liquid diet.  PERTINENT  PMH / PSH: Tobacco use disorder, CVA, esophageal mass, MI, PVD  OBJECTIVE:   BP 118/84   Pulse (!) 58   Ht 5\' 7"  (1.702 m)   Wt 135 lb 3.2 oz (61.3 kg)   SpO2 100%   BMI 21.18 kg/m   General: Cachectic, chronically ill-appearing man in NAD Cardiac: Mildly bradycardic rate with regular rhythm, no MRG Respiratory: CTAB, no rhonchi, rales, or wheezing, normal work of breathing Musculoskeletal: No visual abnormality of the left chest wall, tender to palpation along the intercostal muscles, normal ROM and posture Skin: no rashes or other lesions, warm and well perfused Psych: Intermittently tearful, depressed mood    ASSESSMENT/PLAN:   Esophageal mass High chance of malignancy given patient's symptoms and history.   Urgent referral placed to [facility] GI with [facility].  Patient was advised to call Dulce Sellar if he has not heard from their office within the week.  Bradycardia Appears to be asymptomatic and mild currently.  No exacerbating medications on med list, patient only on short course of Percocet.  Will continue to observe for now.  Essential hypertension, benign Controlled today on amlodipine 5 mg.  This was refilled.  Chest wall pain Considered possibility of referred pain from esophageal biopsy, although this seems highly unlikely.  Most likely muscle spasm due to immobility during hospitalization, especially since patient was tender to palpation along the area.  Prescribed Flexeril and encourage patient to take Tylenol as well.     [place], MD [facility]

## 2019-05-04 NOTE — Assessment & Plan Note (Signed)
Considered possibility of referred pain from esophageal biopsy, although this seems highly unlikely.  Most likely muscle spasm due to immobility during hospitalization, especially since patient was tender to palpation along the area.  Prescribed Flexeril and encourage patient to take Tylenol as well.

## 2019-05-04 NOTE — Patient Instructions (Signed)
It was nice meeting you today [name]!  I have sent an urgent referral to Dr. Dulce Sellar at [facility] for your follow-up appointment.  If you do not hear from their office in the next week, please let us know.  You are side pain is likely from a muscle spasm.  I am sending Flexeril which you can take for this along with Tylenol.  Flexeril may make you drowsy, so please do not drive or do anything that requires your full attention after taking this medicine.  I am also sending in more of your blood pressure medicine, which I recommend that you take daily.  If you have any questions or concerns, please feel free to call the clinic.   Be well,  Dr. Frances Furbish

## 2019-05-04 NOTE — Assessment & Plan Note (Addendum)
Appears to be asymptomatic and mild currently.  No exacerbating medications on med list, patient only on short course of Percocet.  Will continue to observe for now.

## 2019-05-05 ENCOUNTER — Ambulatory Visit: Payer: Self-pay

## 2019-05-05 NOTE — Patient Instructions (Signed)
Visit Information  Goals Addressed            This Visit's Progress   . I need insurance (pt-stated)       CARE PLAN ENTRY (see longtitudinal plan of care for additional care plan information)  Current Barriers:  . Chronic Disease Management support, education, and care coordination needs related to  esophageal mass and need for community resources  Clinical Goal(s) related to esophageal mass and need for community resources:  Over the next 20 days, patient will:  . Work with the care management team to address educational, disease management, and care coordination needs  . Call care management team with questions or concerns . Verbalize basic understanding of patient centered plan of care established today  Interventions related to esophageal mass and need for community resources:  . Evaluation of current treatment plans and patient's adherence to plan as established by provider . Assessed patient understanding of disease states . Assessed patient's education and care coordination needs . Collaborated with appropriate clinical care team members regarding patient needs- patient provided with information about the orange card and [facility] financial information that will be put at the front desk for him to pick up and fill out. . The patient states that he is already working with a case work to get his disability.   . RN case Manager will call and follow up with Dr Hulen Shouts office to get the patient an appointment.  Patient Self Care Activities related to esophageal mass and need for community resources:  . Patient is unable to independently self-manage community resources Initial goal documentation        Ralph [name] was given information about Care Management services today including:  1. Care Management services include personalized support from designated clinical staff supervised by his physician, including individualized plan of care and coordination with other care  providers 2. 24/7 contact phone numbers for assistance for urgent and routine care needs. 3. The patient may stop CCM services at any time (effective at the end of the month) by phone call to the office staff.  Patient agreed to services and verbal consent obtained.   The patient verbalized understanding of instructions provided today and declined a print copy of patient instruction materials.   The care management team will reach out to the patient again over the next 20 days.  The patient has been provided with contact information for the care management team and has been advised to call with any health related questions or concerns.   Ralph Fairly RN, BSN, [facility] Care Management Coordinator [facility] Phone: [phone] Fax: [phone]

## 2019-05-05 NOTE — Chronic Care Management (AMB) (Signed)
Care Management   Initial Visit Note  05/05/2019 Name: Ralph [name] MRN: [id number] DOB: 11-29-1965   Assessment: Ralph [name] is a 54 y.o. year old male who sees [name], Jamal Collin, MD for primary care. The care management team was consulted for assistance with care management and care coordination needs related to  esophageal mass and need for community resource.   Review of patient status, including review of consultants reports, relevant laboratory and other test results, and collaboration with appropriate care team members and the patient's provider was performed as part of comprehensive patient evaluation and provision of care management services.    SDOH (Social Determinants of Health) assessments performed: No See Care Plan activities for detailed interventions related to [facility])     Outpatient Encounter Medications as of 05/05/2019  Medication Sig  . amLODipine (NORVASC) 5 MG tablet Take 1 tablet (5 mg total) by mouth daily.  Marland Kitchen atorvastatin (LIPITOR) 80 MG tablet Take 1 tablet (80 mg total) by mouth daily.  . cyclobenzaprine (FLEXERIL) 10 MG tablet Take 1 tablet (10 mg total) by mouth 3 (three) times daily as needed for muscle spasms.  . pantoprazole (PROTONIX) 40 MG tablet Take 1 tablet (40 mg total) by mouth 2 (two) times daily.  Marland Kitchen oxyCODONE-acetaminophen (PERCOCET) 5-325 MG tablet Take 1 tablet by mouth every 8 (eight) hours as needed for severe pain. (Patient not taking: Reported on 05/05/2019)   No facility-administered encounter medications on file as of 05/05/2019.    Goals Addressed            This Visit's Progress   . I need insurance (pt-stated)       CARE PLAN ENTRY (see longtitudinal plan of care for additional care plan information)  Current Barriers:  . Chronic Disease Management support, education, and care coordination needs related to  esophageal mass and need for community resources  Clinical Goal(s) related to esophageal mass and need for community  resources:  Over the next 20 days, patient will:  . Work with the care management team to address educational, disease management, and care coordination needs  . Call care management team with questions or concerns . Verbalize basic understanding of patient centered plan of care established today  Interventions related to esophageal mass and need for community resources:  . Evaluation of current treatment plans and patient's adherence to plan as established by provider . Assessed patient understanding of disease states . Assessed patient's education and care coordination needs . Collaborated with appropriate clinical care team members regarding patient needs- patient provided with information about the orange card and [facility] financial information that will be put at the front desk for him to pick up and fill out. . The patient states that he is already working with a case work to get his disability.   . RN case Manager will call and follow up with Dr Erlinda Hong office to get the patient an appointment.  Patient Self Care Activities related to esophageal mass and need for community resources:  . Patient is unable to independently self-manage community resources Initial goal documentation         Follow up plan:  The care management team will reach out to the patient again over the next 20 days.  The patient has been provided with contact information for the care management team and has been advised to call with any health related questions or concerns.   Mr. Revoir was given information about Care Management services today including:  1. Care Management  services include personalized support from designated clinical staff supervised by a physician, including individualized plan of care and coordination with other care providers 2. 24/7 contact phone numbers for assistance for urgent and routine care needs. 3. The patient may stop Care Management services at any time (effective at the end of the  month) by phone call to the office staff.  Patient agreed to services and verbal consent obtained.  Juanell Fairly RN, BSN, [facility] Care Management Coordinator [facility] Phone: [phone] Fax: [phone]

## 2019-05-12 ENCOUNTER — Ambulatory Visit: Payer: Self-pay

## 2019-05-12 NOTE — Patient Instructions (Signed)
Visit Information  Goals Addressed            This Visit's Progress   . I need insurance (pt-stated)       CARE PLAN ENTRY (see longtitudinal plan of care for additional care plan information)  Current Barriers:  . Chronic Disease Management support, education, and care coordination needs related to  esophageal mass and need for community resources  Clinical Goal(s) related to esophageal mass and need for community resources:  Over the next 20 days, patient will:  . Work with the care management team to address educational, disease management, and care coordination needs  . Call care management team with questions or concerns . Verbalize basic understanding of patient centered plan of care established today  Interventions related to esophageal mass and need for community resources:  . Evaluation of current treatment plans and patient's adherence to plan as established by provider . Assessed patient understanding of disease states . Assessed patient's education and care coordination needs . Collaborated with appropriate clinical care team members regarding patient needs- patient provided with information about the orange card and [facility] financial information that will be put at the front desk for him to pick up and fill out. . The patient states that he is already working with a case work to get his disability.   . RN case Manager will call and follow up with Dr Hulen Shouts office to get the patient an appointment.  . 05/11/19 . Called Dr Hulen Shouts office to find out the progress of the patiens's appointment for esophageal mass.  Spoke with Alexsis, she states that they are waiting on a response back from the doctor to open up a time and they will call the patient with an appointment.  Mr. Prins was called and notified. . I asked the patient had he picked up the information for the orange care and [facility] financial and he stated no.  Just reminded him to pick the information up so we can get the  process started for him.  He states that he needs to call and reschedule an appointment with disability.  Patient Self Care Activities related to esophageal mass and need for community resources:  . Patient is unable to independently self-manage community resources Please see past updates related to this goal by clicking on the "Past Updates" button in the selected goal         Mr. Delfin was given information about Care Management services today including:  1. Care Management services include personalized support from designated clinical staff supervised by his physician, including individualized plan of care and coordination with other care providers 2. 24/7 contact phone numbers for assistance for urgent and routine care needs. 3. The patient may stop CCM services at any time (effective at the end of the month) by phone call to the office staff.  Patient agreed to services and verbal consent obtained.   The patient verbalized understanding of instructions provided today and declined a print copy of patient instruction materials.   The patient has been provided with contact information for the care management team and has been advised to call with any health related questions or concerns.   Juanell Fairly RN, BSN, [facility] Care Management Coordinator [facility] Phone: [phone] Fax: [phone]

## 2019-05-12 NOTE — Chronic Care Management (AMB) (Signed)
Care Management   Follow Up Note   05/12/2019 Name: Ralph [name] MRN: [id number] DOB: 28-Feb-1965  Referred by: Ralph Manners, MD Reason for referral : Care Coordination (Care Management RNCM F/U  GI appointment)   Ralph [name] is a 54 y.o. year old male who is a primary care patient of [name], [name], MD. The care management team was consulted for assistance with care management and care coordination needs.    Review of patient status, including review of consultants reports, relevant laboratory and other test results, and collaboration with appropriate care team members and the patient's provider was performed as part of comprehensive patient evaluation and provision of chronic care management services.    SDOH (Social Determinants of Health) assessments performed: No See Care Plan activities for detailed interventions related to [facility])     Advanced Directives: See Care Plan and Vynca application for related entries.   Goals Addressed            This Visit's Progress   . I need insurance (pt-stated)       CARE PLAN ENTRY (see longtitudinal plan of care for additional care plan information)  Current Barriers:  . Chronic Disease Management support, education, and care coordination needs related to  esophageal mass and need for community resources  Clinical Goal(s) related to esophageal mass and need for community resources:  Over the next 20 days, patient will:  . Work with the care management team to address educational, disease management, and care coordination needs  . Call care management team with questions or concerns . Verbalize basic understanding of patient centered plan of care established today  Interventions related to esophageal mass and need for community resources:  . Evaluation of current treatment plans and patient's adherence to plan as established by provider . Assessed patient understanding of disease states . Assessed patient's education and care  coordination needs . Collaborated with appropriate clinical care team members regarding patient needs- patient provided with information about the orange card and [facility] financial information that will be put at the front desk for him to pick up and fill out. . The patient states that he is already working with a case work to get his disability.   . RN case Manager will call and follow up with Dr Ralph [name] office to get the patient an appointment.  . 05/11/19 . Called Dr Ralph [name] office to find out the progress of the patiens's appointment for esophageal mass.  Spoke with Ralph [name], she states that they are waiting on a response back from the doctor to open up a time and they will call the patient with an appointment.  Ralph [name] was called and notified. . I asked the patient had he picked up the information for the orange care and [facility] financial and he stated no.  Just reminded him to pick the information up so we can get the process started for him.  He states that he needs to call and reschedule an appointment with disability.  Patient Self Care Activities related to esophageal mass and need for community resources:  . Patient is unable to independently self-manage community resources Please see past updates related to this goal by clicking on the "Past Updates" button in the selected goal          The patient has been provided with contact information for the care management team and has been advised to call with any health related questions or concerns.   Juanell Fairly RN, BSN, [facility]  Care Management Coordinator [place] Phone: [phone] Fax: [phone]

## 2019-05-15 ENCOUNTER — Telehealth: Payer: Self-pay

## 2019-05-18 ENCOUNTER — Other Ambulatory Visit: Payer: Self-pay | Admitting: Gastroenterology

## 2019-05-23 ENCOUNTER — Other Ambulatory Visit (HOSPITAL_COMMUNITY)
Admission: RE | Admit: 2019-05-23 | Discharge: 2019-05-23 | Disposition: A | Discharge status: Home / Self Care | Payer: HRSA Program | Source: Ambulatory Visit | Attending: Gastroenterology | Admitting: Gastroenterology

## 2019-05-23 DIAGNOSIS — Z20822 Contact with and (suspected) exposure to covid-19: Secondary | ICD-10-CM | POA: Diagnosis not present

## 2019-05-23 DIAGNOSIS — Z01812 Encounter for preprocedural laboratory examination: Secondary | ICD-10-CM | POA: Diagnosis present

## 2019-05-23 LAB — SARS CORONAVIRUS 2 (TAT 6-24 HRS): SARS Coronavirus 2: NEGATIVE

## 2019-05-26 ENCOUNTER — Other Ambulatory Visit: Payer: Self-pay | Admitting: Gastroenterology

## 2019-05-27 ENCOUNTER — Inpatient Hospital Stay: Payer: Self-pay

## 2019-05-27 ENCOUNTER — Encounter (HOSPITAL_COMMUNITY)
Admission: RE | Disposition: A | Discharge status: Home / Self Care | Payer: Self-pay | Source: Home / Self Care | Attending: Internal Medicine

## 2019-05-27 ENCOUNTER — Inpatient Hospital Stay (HOSPITAL_COMMUNITY): Payer: Self-pay

## 2019-05-27 ENCOUNTER — Ambulatory Visit (HOSPITAL_COMMUNITY): Payer: Self-pay | Admitting: Anesthesiology

## 2019-05-27 ENCOUNTER — Inpatient Hospital Stay (HOSPITAL_COMMUNITY)
Admission: RE | Admit: 2019-05-27 | Discharge: 2019-06-02 | DRG: 391 | Disposition: A | Discharge status: Home / Self Care | Payer: Self-pay | Attending: Internal Medicine | Admitting: Internal Medicine

## 2019-05-27 ENCOUNTER — Other Ambulatory Visit: Payer: Self-pay

## 2019-05-27 ENCOUNTER — Encounter (HOSPITAL_COMMUNITY): Payer: Self-pay | Admitting: Gastroenterology

## 2019-05-27 DIAGNOSIS — Z79899 Other long term (current) drug therapy: Secondary | ICD-10-CM

## 2019-05-27 DIAGNOSIS — K222 Esophageal obstruction: Secondary | ICD-10-CM | POA: Diagnosis present

## 2019-05-27 DIAGNOSIS — Z6820 Body mass index (BMI) 20.0-20.9, adult: Secondary | ICD-10-CM

## 2019-05-27 DIAGNOSIS — K2100 Gastro-esophageal reflux disease with esophagitis, without bleeding: Principal | ICD-10-CM | POA: Diagnosis present

## 2019-05-27 DIAGNOSIS — I10 Essential (primary) hypertension: Secondary | ICD-10-CM | POA: Diagnosis present

## 2019-05-27 DIAGNOSIS — E785 Hyperlipidemia, unspecified: Secondary | ICD-10-CM | POA: Diagnosis present

## 2019-05-27 DIAGNOSIS — I2510 Atherosclerotic heart disease of native coronary artery without angina pectoris: Secondary | ICD-10-CM | POA: Diagnosis present

## 2019-05-27 DIAGNOSIS — Z8673 Personal history of transient ischemic attack (TIA), and cerebral infarction without residual deficits: Secondary | ICD-10-CM

## 2019-05-27 DIAGNOSIS — E162 Hypoglycemia, unspecified: Secondary | ICD-10-CM | POA: Diagnosis present

## 2019-05-27 DIAGNOSIS — K449 Diaphragmatic hernia without obstruction or gangrene: Secondary | ICD-10-CM | POA: Diagnosis present

## 2019-05-27 DIAGNOSIS — E860 Dehydration: Secondary | ICD-10-CM | POA: Diagnosis present

## 2019-05-27 DIAGNOSIS — K2270 Barrett's esophagus without dysplasia: Secondary | ICD-10-CM | POA: Diagnosis present

## 2019-05-27 DIAGNOSIS — I252 Old myocardial infarction: Secondary | ICD-10-CM

## 2019-05-27 DIAGNOSIS — K209 Esophagitis, unspecified without bleeding: Secondary | ICD-10-CM

## 2019-05-27 DIAGNOSIS — F17200 Nicotine dependence, unspecified, uncomplicated: Secondary | ICD-10-CM | POA: Diagnosis present

## 2019-05-27 DIAGNOSIS — R109 Unspecified abdominal pain: Secondary | ICD-10-CM

## 2019-05-27 DIAGNOSIS — R634 Abnormal weight loss: Secondary | ICD-10-CM | POA: Diagnosis present

## 2019-05-27 DIAGNOSIS — R1310 Dysphagia, unspecified: Secondary | ICD-10-CM

## 2019-05-27 DIAGNOSIS — E43 Unspecified severe protein-calorie malnutrition: Secondary | ICD-10-CM | POA: Diagnosis present

## 2019-05-27 DIAGNOSIS — F17210 Nicotine dependence, cigarettes, uncomplicated: Secondary | ICD-10-CM | POA: Diagnosis present

## 2019-05-27 HISTORY — PX: ESOPHAGOGASTRODUODENOSCOPY (EGD) WITH PROPOFOL: SHX5813

## 2019-05-27 HISTORY — PX: BIOPSY: SHX5522

## 2019-05-27 LAB — CBC WITH DIFFERENTIAL/PLATELET
Abs Immature Granulocytes: 0.04 10*3/uL (ref 0.00–0.07)
Basophils Absolute: 0.1 10*3/uL (ref 0.0–0.1)
Basophils Relative: 1 %
Eosinophils Absolute: 0.5 10*3/uL (ref 0.0–0.5)
Eosinophils Relative: 5 %
HCT: 50 % (ref 39.0–52.0)
Hemoglobin: 16.7 g/dL (ref 13.0–17.0)
Immature Granulocytes: 0 %
Lymphocytes Relative: 21 %
Lymphs Abs: 2.1 10*3/uL (ref 0.7–4.0)
MCH: 31.5 pg (ref 26.0–34.0)
MCHC: 33.4 g/dL (ref 30.0–36.0)
MCV: 94.2 fL (ref 80.0–100.0)
Monocytes Absolute: 0.7 10*3/uL (ref 0.1–1.0)
Monocytes Relative: 7 %
Neutro Abs: 6.6 10*3/uL (ref 1.7–7.7)
Neutrophils Relative %: 66 %
Platelets: 226 10*3/uL (ref 150–400)
RBC: 5.31 MIL/uL (ref 4.22–5.81)
RDW: 13.1 % (ref 11.5–15.5)
WBC: 10.1 10*3/uL (ref 4.0–10.5)
nRBC: 0 % (ref 0.0–0.2)

## 2019-05-27 LAB — COMPREHENSIVE METABOLIC PANEL
ALT: 13 U/L (ref 0–44)
AST: 18 U/L (ref 15–41)
Albumin: 4.1 g/dL (ref 3.5–5.0)
Alkaline Phosphatase: 86 U/L (ref 38–126)
Anion gap: 11 (ref 5–15)
BUN: 8 mg/dL (ref 6–20)
CO2: 27 mmol/L (ref 22–32)
Calcium: 9.7 mg/dL (ref 8.9–10.3)
Chloride: 97 mmol/L — ABNORMAL LOW (ref 98–111)
Creatinine, Ser: 0.83 mg/dL (ref 0.61–1.24)
GFR calc Af Amer: >60 mL/min (ref >60)
GFR calc non Af Amer: >60 mL/min (ref >60)
Glucose, Bld: 92 mg/dL (ref 70–99)
Potassium: 4.6 mmol/L (ref 3.5–5.1)
Sodium: 135 mmol/L (ref 135–145)
Total Bilirubin: 0.9 mg/dL (ref 0.3–1.2)
Total Protein: 7.3 g/dL (ref 6.5–8.1)

## 2019-05-27 SURGERY — ESOPHAGOGASTRODUODENOSCOPY (EGD) WITH PROPOFOL
Anesthesia: Monitor Anesthesia Care

## 2019-05-27 MED ORDER — KETOROLAC TROMETHAMINE 30 MG/ML IJ SOLN
30.0000 mg | Freq: Four times a day (QID) | INTRAMUSCULAR | Status: DC | PRN
  Administered 2019-05-27 – 2019-05-30 (×11): 30 mg via INTRAVENOUS
  Filled 2019-05-27 (×11): qty 1

## 2019-05-27 MED ORDER — LORAZEPAM 2 MG/ML IJ SOLN
0.5000 mg | Freq: Three times a day (TID) | INTRAMUSCULAR | Status: DC | PRN
  Administered 2019-05-27 – 2019-05-28 (×2): 0.5 mg via INTRAVENOUS
  Filled 2019-05-27 (×2): qty 1

## 2019-05-27 MED ORDER — FLUCONAZOLE IN SODIUM CHLORIDE 200-0.9 MG/100ML-% IV SOLN
200.0000 mg | Freq: Every day | INTRAVENOUS | Status: DC
  Administered 2019-05-28 – 2019-06-02 (×6): 200 mg via INTRAVENOUS
  Filled 2019-05-27 (×6): qty 100

## 2019-05-27 MED ORDER — LACTATED RINGERS IV SOLN
INTRAVENOUS | Status: AC | PRN
  Administered 2019-05-27: 1000 mL via INTRAVENOUS

## 2019-05-27 MED ORDER — NICOTINE 21 MG/24HR TD PT24
21.0000 mg | MEDICATED_PATCH | Freq: Every day | TRANSDERMAL | Status: DC
  Administered 2019-05-27 – 2019-06-02 (×7): 21 mg via TRANSDERMAL
  Filled 2019-05-27 (×7): qty 1

## 2019-05-27 MED ORDER — SODIUM CHLORIDE 0.9% FLUSH
10.0000 mL | Freq: Two times a day (BID) | INTRAVENOUS | Status: DC
  Administered 2019-05-28 – 2019-05-31 (×4): 10 mL

## 2019-05-27 MED ORDER — SODIUM CHLORIDE 0.9 % IV SOLN: INTRAVENOUS | Status: DC

## 2019-05-27 MED ORDER — FLUCONAZOLE IN SODIUM CHLORIDE 400-0.9 MG/200ML-% IV SOLN
400.0000 mg | Freq: Once | INTRAVENOUS | Status: AC
  Administered 2019-05-27: 400 mg via INTRAVENOUS
  Filled 2019-05-27: qty 200

## 2019-05-27 MED ORDER — PROPOFOL 500 MG/50ML IV EMUL
INTRAVENOUS | Status: AC
  Filled 2019-05-27: qty 50

## 2019-05-27 MED ORDER — PROPOFOL 10 MG/ML IV BOLUS
INTRAVENOUS | Status: AC
  Filled 2019-05-27: qty 20

## 2019-05-27 MED ORDER — ACETAMINOPHEN 325 MG PO TABS
650.0000 mg | ORAL_TABLET | Freq: Four times a day (QID) | ORAL | Status: DC | PRN
  Administered 2019-06-02: 650 mg via ORAL
  Filled 2019-05-27: qty 2

## 2019-05-27 MED ORDER — PANTOPRAZOLE SODIUM 40 MG IV SOLR
40.0000 mg | Freq: Two times a day (BID) | INTRAVENOUS | Status: DC
  Administered 2019-05-27 – 2019-06-02 (×13): 40 mg via INTRAVENOUS
  Filled 2019-05-27 (×13): qty 40

## 2019-05-27 MED ORDER — DEXTROSE-NACL 5-0.9 % IV SOLN: INTRAVENOUS | Status: DC

## 2019-05-27 MED ORDER — ONDANSETRON HCL 4 MG PO TABS: 4.0000 mg | ORAL_TABLET | Freq: Four times a day (QID) | ORAL | Status: DC | PRN

## 2019-05-27 MED ORDER — PROMETHAZINE HCL 25 MG/ML IJ SOLN: 6.2500 mg | INTRAMUSCULAR | Status: DC | PRN

## 2019-05-27 MED ORDER — ONDANSETRON HCL 4 MG/2ML IJ SOLN
4.0000 mg | Freq: Four times a day (QID) | INTRAMUSCULAR | Status: DC | PRN
  Administered 2019-06-01 (×2): 4 mg via INTRAVENOUS
  Filled 2019-05-27 (×2): qty 2

## 2019-05-27 MED ORDER — SODIUM CHLORIDE 0.9% FLUSH: 10.0000 mL | INTRAVENOUS | Status: DC | PRN

## 2019-05-27 MED ORDER — CHLORHEXIDINE GLUCONATE CLOTH 2 % EX PADS
6.0000 | MEDICATED_PAD | Freq: Every day | CUTANEOUS | Status: DC
  Administered 2019-05-29 – 2019-06-01 (×4): 6 via TOPICAL

## 2019-05-27 MED ORDER — LIDOCAINE HCL 1 % IJ SOLN
INTRAMUSCULAR | Status: DC | PRN
  Administered 2019-05-27: 60 mg via INTRADERMAL

## 2019-05-27 MED ORDER — ACETAMINOPHEN 650 MG RE SUPP: 650.0000 mg | Freq: Four times a day (QID) | RECTAL | Status: DC | PRN

## 2019-05-27 MED ORDER — PROPOFOL 500 MG/50ML IV EMUL
INTRAVENOUS | Status: DC | PRN
  Administered 2019-05-27: 150 ug/kg/min via INTRAVENOUS

## 2019-05-27 MED ORDER — PROPOFOL 10 MG/ML IV BOLUS
INTRAVENOUS | Status: DC | PRN
  Administered 2019-05-27 (×2): 20 mg via INTRAVENOUS

## 2019-05-27 MED ORDER — ENOXAPARIN SODIUM 40 MG/0.4ML ~~LOC~~ SOLN
40.0000 mg | SUBCUTANEOUS | Status: DC
  Administered 2019-05-27 – 2019-06-01 (×6): 40 mg via SUBCUTANEOUS
  Filled 2019-05-27 (×6): qty 0.4

## 2019-05-27 MED ORDER — INSULIN ASPART 100 UNIT/ML ~~LOC~~ SOLN
0.0000 [IU] | SUBCUTANEOUS | Status: DC
  Administered 2019-05-29: 1 [IU] via SUBCUTANEOUS

## 2019-05-27 SURGICAL SUPPLY — 15 items

## 2019-05-27 NOTE — Progress Notes (Signed)
Pharmacy Antibiotic Note  Ralph [name] is a 54 y.o. male admitted on 05/27/2019 with severe esophagitis.  Pharmacy has been consulted for fluconazole dosing.  Most recent QTc WNL No drug interactions noted with IP meds  Plan:  Fluconazole 400 mg IV x 1, then 200 mg IV daily thereafter  May reduce clearance of several home meds (atorva, oxycodone, amlodipine) but d/t esophagitis likely won't be resuming these for a while    Height: 5\' 8"  (172.7 cm) Weight: 61.2 kg (135 lb) IBW/kg (Calculated) : 68.4  Temp (24hrs), Avg:97.9 F (36.6 C), Min:97.5 F (36.4 C), Max:98.6 F (37 C)  No results for input(s): WBC, CREATININE, LATICACIDVEN, VANCOTROUGH, VANCOPEAK, VANCORANDOM, GENTTROUGH, GENTPEAK, GENTRANDOM, TOBRATROUGH, TOBRAPEAK, TOBRARND, AMIKACINPEAK, AMIKACINTROU, AMIKACIN in the last 168 hours.  CrCl cannot be calculated (Patient's most recent lab result is older than the maximum 21 days allowed.).    No Known Allergies   Thank you for allowing pharmacy to be a part of this patient's care.  Latishia Suitt A 05/27/2019 1:27 PM

## 2019-05-27 NOTE — H&P (Signed)
History and Physical    Ralph [name]:[id number] DOB: 05-20-1965 DOA: 05/27/2019  PCP: Zenia Resides, MD   Patient coming from: Home  I have personally briefly reviewed patient's old medical records in [place]  Chief Complaint: Dysphagia  HPI: Ralph [name] is a 54 y.o. male with medical history significant of unspecified CVA, CAD/MI, hypertension, hyperlipidemia, admission and discharge in March 2021 for worsening dysphagia and was found to have severe ulcerative esophagitis with a mass noted in the distal esophagus with biopsies showing Barrett's esophagus without malignancy and was discharged on oral Protonix presented today for outpatient endoscopy.  Patient had upper GI endoscopy performed by Dr. Paulita Fujita today and patient was found to have severe esophagitis.  Dr. Paulita Fujita contacted hospitalist service for possible admission to the hospital for severe esophagitis/inability to swallow and weight loss.  Patient states that he has been having very hard time swallowing solid foods and liquids.  He has lost almost 30 pounds over the last few months.  Complains of intermittent nausea with occasional vomiting.  No fevers, shortness of breath, chest pain, abdominal pain, diarrhea or dysuria.  No loss of consciousness.  Review of Systems: As per HPI otherwise all other systems were reviewed and are negative.   Past Medical History:  Diagnosis Date  . Coronary artery disease   . Myocardial infarction ([name])   . Stroke [facility])     Past Surgical History:  Procedure Laterality Date  . APPENDECTOMY    . BIOPSY  04/29/2019   Procedure: BIOPSY;  Surgeon: Wilford Corner, MD;  Location: [place];  Service: Endoscopy;;  . ESOPHAGOGASTRODUODENOSCOPY (EGD) WITH PROPOFOL N/A 04/29/2019   Procedure: ESOPHAGOGASTRODUODENOSCOPY (EGD) WITH PROPOFOL;  Surgeon: Wilford Corner, MD;  Location: [place];  Service: Endoscopy;  Laterality: N/A;  . SHOULDER SURGERY     Social history  reports that he has been smoking cigarettes. He has a 30.00 pack-year smoking history. He has never used smokeless tobacco. He reports current alcohol use of about 1.0 standard drinks of alcohol per week. He reports current drug use. Drug: Cocaine.  No Known Allergies  History reviewed. No pertinent family history.  Prior to Admission medications   Medication Sig Start Date End Date Taking? Authorizing Provider  amLODipine (NORVASC) 5 MG tablet Take 1 tablet (5 mg total) by mouth daily. 05/04/19  Yes Winfrey, Alcario Drought, MD  atorvastatin (LIPITOR) 80 MG tablet Take 1 tablet (80 mg total) by mouth daily. 05/02/19  Yes Gifford Shave, MD  pantoprazole (PROTONIX) 40 MG tablet Take 1 tablet (40 mg total) by mouth 2 (two) times daily. 05/02/19 05/01/20 Yes Gifford Shave, MD  cyclobenzaprine (FLEXERIL) 10 MG tablet Take 1 tablet (10 mg total) by mouth 3 (three) times daily as needed for muscle spasms. Patient not taking: Reported on 05/22/2019 05/04/19   Kathrene Alu, MD  oxyCODONE-acetaminophen (PERCOCET) 5-325 MG tablet Take 1 tablet by mouth every 8 (eight) hours as needed for severe pain. Patient not taking: Reported on 05/05/2019 05/02/19 05/01/20  Gifford Shave, MD    Physical Exam: Vitals:   05/27/19 1140 05/27/19 1150 05/27/19 1200 05/27/19 1210  BP: (!) 83/42 (!) 101/58 121/67 104/63  Pulse: (!) 46 (!) 44 (!) 59 (!) 58  Resp: 17 18 17 14   Temp: (!) 97.5 F (36.4 C)     TempSrc: Axillary     SpO2: 97% 98% 98% 95%  Weight:      Height:        Constitutional: NAD,  calm, comfortable.  Looks very thinly built.  Chronically ill looking. Vitals:   05/27/19 1140 05/27/19 1150 05/27/19 1200 05/27/19 1210  BP: (!) 83/42 (!) 101/58 121/67 104/63  Pulse: (!) 46 (!) 44 (!) 59 (!) 58  Resp: 17 18 17 14   Temp: (!) 97.5 F (36.4 C)     TempSrc: Axillary     SpO2: 97% 98% 98% 95%  Weight:      Height:       Eyes: PERRL, lids and conjunctivae normal ENMT: Mucous membranes are dry.  Posterior pharynx clear of any exudate or lesions. Neck: normal, supple, no masses, no thyromegaly Respiratory: bilateral decreased breath sounds at bases, no wheezing, no crackles. Normal respiratory effort. No accessory muscle use.  Cardiovascular: S1 S2 positive, rate controlled. No extremity edema. 2+ pedal pulses.  Abdomen: no tenderness, no masses palpated. No hepatosplenomegaly. Bowel sounds positive.  Musculoskeletal: no clubbing / cyanosis. No joint deformity upper and lower extremities.  Skin: no rashes, lesions, ulcers. No induration Neurologic: CN 2-12 grossly intact.  Poor historian.  Moving extremities. No focal neurologic deficits.  Psychiatric: Anxious looking.   Labs on Admission:  No recent labs.  I have reviewed last month's lab myself.    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 who recommends that patient be admitted for IV fluids and possibly TPN along with IV PPI and Diflucan -We will start patient on D5 normal saline at 100 cc an hour.  PICC line placement.  Pharmacy consult for TPN. -Protonix IV every 12 hours for now.  Diflucan IV in case patient has Candida esophagitis.  Follow biopsy results.  Follow further recommendations from GI. -Patient might need repeat upper GI endoscopy in the next few days for possible dilatation of the stricture. -N.p.o. except for ice chips/water -Nutrition consult  Hypertension -Monitor blood pressure.  Blood pressure on the lower side.  Hold amlodipine for now  Hyperlipidemia -Statin on hold till the patient is able to swallow orally  Chronic tobacco abuse -Patient will need counseling regarding tobacco cessation during the  hospitalization    DVT prophylaxis: Lovenox Code Status: Full Family Communication: Spoke to patient at bedside Disposition Plan: Will remain inpatient for several days on IV TPN/IV PPI.  Will need repeat EGD at some point by GI. Consults called: GI Admission status: Inpatient/MedSurg  Severity of Illness: The appropriate patient status for this patient is INPATIENT. Inpatient status is judged to be reasonable and necessary in order to provide the required intensity of service to ensure the patient's safety. The patient's presenting symptoms, physical exam findings, and initial radiographic and laboratory data in the context of their chronic comorbidities is felt to place them at high risk for further clinical deterioration. Furthermore, it is not anticipated that the patient will be medically stable for discharge from the hospital within 2 midnights of admission. The following factors support the patient status of inpatient.   " The patient's presenting symptoms include dysphagia/weight loss. " The worrisome physical exam findings include dehydration. " The initial radiographic and laboratory data are worrisome because of EGD findings of severe esophagitis with stricture. " The chronic co-morbidities include hypertension/hyperlipidemia/recent esophagitis.   * I certify that at the point of admission it is my clinical judgment that the patient  will require inpatient hospital care spanning beyond 2 midnights from the point of admission due to high intensity of service, high risk for further deterioration and high frequency of surveillance required.Glade Lloyd MD [facility]  05/27/2019, 12:19 PM

## 2019-05-27 NOTE — Anesthesia Preprocedure Evaluation (Signed)
Anesthesia Evaluation  Patient identified by MRN, date of birth, ID band Patient awake    Reviewed: Allergy & Precautions, NPO status , Patient's Chart, lab work & pertinent test results  Airway Mallampati: II  TM Distance: >3 FB Neck ROM: Full    Dental  (+) Missing,    Pulmonary neg pulmonary ROS, Current Smoker and Patient abstained from smoking.,    Pulmonary exam normal breath sounds clear to auscultation       Cardiovascular hypertension, Pt. on medications + CAD, + Past MI and + Peripheral Vascular Disease  Normal cardiovascular exam Rhythm:Regular Rate:Normal  TTE 04/2019 Normal LVEF, G1DD, valves ok   Neuro/Psych CVA negative psych ROS   GI/Hepatic negative GI ROS, Neg liver ROS,   Endo/Other  negative endocrine ROS  Renal/GU negative Renal ROS  negative genitourinary   Musculoskeletal negative musculoskeletal ROS (+)   Abdominal   Peds  Hematology negative hematology ROS (+)   Anesthesia Other Findings   Reproductive/Obstetrics                             Anesthesia Physical Anesthesia Plan  ASA: III  Anesthesia Plan: MAC   Post-op Pain Management:    Induction: Intravenous  PONV Risk Score and Plan: Propofol infusion and Treatment may vary due to age or medical condition  Airway Management Planned: Natural Airway  Additional Equipment:   Intra-op Plan:   Post-operative Plan:   Informed Consent: I have reviewed the patients History and Physical, chart, labs and discussed the procedure including the risks, benefits and alternatives for the proposed anesthesia with the patient or authorized representative who has indicated his/her understanding and acceptance.     Dental advisory given  Plan Discussed with: CRNA  Anesthesia Plan Comments:         Anesthesia Quick Evaluation

## 2019-05-27 NOTE — H&P (Signed)
Patient interval history reviewed.  Patient examined again.  There has been no change from documented H/P scanned into chart from our office except as documented below.  Assessment  1.  Progressive dysphagia. 2.  Esophagitis. 3.  Esophageal mass; biopsies non diagnostic for malignancy.  Plan:  1.  Upper endoscopy with possible upper endoscopic ultrasound. 2.  Risks (bleeding, infection, bowel perforation that could require surgery, sedation-related changes in cardiopulmonary systems), benefits (identification and possible treatment of source of symptoms, exclusion of certain causes of symptoms), and alternatives (watchful waiting, radiographic imaging studies, empiric medical treatment) of upper endoscopy with possible ultrasound (EGD +/- EUS) were explained to patient/family in detail and patient wishes to proceed.

## 2019-05-27 NOTE — Progress Notes (Signed)
PHARMACY - TOTAL PARENTERAL NUTRITION CONSULT NOTE   Indication: prolonged malnutrition with significant weight loss, intolerance to enteral feeding  Patient Measurements: Height: 5\' 8"  (172.7 cm) Weight: 61.2 kg (135 lb) IBW/kg (Calculated) : 68.4 TPN AdjBW (KG): 61.2 Body mass index is 20.53 kg/m. Usual Weight: 77 kg  Assessment:  54 y/o M hospitalized in March for worsening dysphagia was found to have severe ulcerative esophagits, Barrett's esophagus. Patient reports very hard time swallowing solid foods and liquids with reported weight loss of 30 pounds. Pharmacy consulted to initiate TPN.   Glucose / Insulin: sSSI q 4 hours to start with TPN 4/22 Electrolytes:  Renal:  LFTs / TGs:  Prealbumin / albumin:  Intake / Output; MIVF: D5NS @ 100 ml/hr GI Imaging: 4/21 EGD with moderate to severe esophagitis without bleeding Surgeries / Procedures:   Central access:  TPN start date:   Nutritional Goals kCal: 1836, Protein: 62 g, Fluid: 1850-2300 mL  Current Nutrition:  NPO  Plan:  - Will obtain baseline labs and initiate TPN 4/22.   5/22 D 05/27/2019,1:55 PM

## 2019-05-27 NOTE — Progress Notes (Signed)
Peripherally Inserted Central Catheter Placement  The IV Nurse has discussed with the patient and/or persons authorized to consent for the patient, the purpose of this procedure and the potential benefits and risks involved with this procedure.  The benefits include less needle sticks, lab draws from the catheter, and the patient may be discharged home with the catheter. Risks include, but not limited to, infection, bleeding, blood clot (thrombus formation), and puncture of an artery; nerve damage and irregular heartbeat and possibility to perform a PICC exchange if needed/ordered by physician.  Alternatives to this procedure were also discussed.  Bard Power PICC patient education guide, fact sheet on infection prevention and patient information card has been provided to patient /or left at bedside.    PICC Placement Documentation  PICC Double Lumen 05/27/19 PICC Right Brachial 39 cm 0 cm (Active)  Indication for Insertion or Continuance of Line Administration of hyperosmolar/irritating solutions (i.e. TPN, Vancomycin, etc.);Prolonged intravenous therapies 05/27/19 1735  Exposed Catheter (cm) 0 cm 05/27/19 1735  Site Assessment Clean;Dry;Intact 05/27/19 1735  Lumen #1 Status Flushed;Saline locked;Blood return noted 05/27/19 1735  Lumen #2 Status Flushed;Saline locked;Blood return noted 05/27/19 1735  Dressing Type Transparent;Securing device 05/27/19 1735  Dressing Status Clean;Dry;Intact;Antimicrobial disc in place 05/27/19 1735  Safety Lock Not Applicable 05/27/19 1735  Dressing Intervention New dressing 05/27/19 1735  Dressing Change Due 06/03/19 05/27/19 1735       Annett Fabian 05/27/2019, 5:47 PM

## 2019-05-27 NOTE — Op Note (Addendum)
[facility] Patient Name: Ralph [name] Procedure Date: 05/27/2019 MRN: [id number] Attending MD: Arta Silence , MD Date of Birth: Jan 26, 1966 CSN: [id number] Age: 54 Admit Type: Outpatient Procedure:                Upper GI endoscopy Indications:              Dysphagia Providers:                Arta Silence, MD, Benetta Spar RN, RN, Ramsay Bognar Dalton, Technician Referring MD:              Medicines:                Monitored Anesthesia Care Complications:            No immediate complications. Estimated Blood Loss:     Estimated blood loss was minimal. Procedure:                Pre-Anesthesia Assessment:                           - Prior to the procedure, a History and Physical                            was performed, and patient medications and                            allergies were reviewed. The patient's tolerance of                            previous anesthesia was also reviewed. The risks                            and benefits of the procedure and the sedation                            options and risks were discussed with the patient.                            All questions were answered, and informed consent                            was obtained. Prior Anticoagulants: The patient has                            taken no previous anticoagulant or antiplatelet                            agents. ASA Grade Assessment: III - A patient with                            severe systemic disease. After reviewing the risks  and benefits, the patient was deemed in                            satisfactory condition to undergo the procedure.                           After obtaining informed consent, the endoscope was                            passed under direct vision. Throughout the                            procedure, the patient's blood pressure, pulse, and                            oxygen saturations were  monitored continuously. The                            GIF-H190 ([id number]) Olympus gastroscope was                            introduced through the mouth, and advanced to the                            lower third of esophagus. The upper GI endoscopy                            was accomplished without difficulty. The patient                            tolerated the procedure well. Scope In: Scope Out: Findings:      Moderately severe esophagitis with trace contact bleeding was found in       proximal and mid esophagus. Voluminous amount of thick mucous had to be       suctioned out to facilitate visualization. Biopsies were taken with a       cold forceps for histology of the stricture and inflammation.      One severe stenosis was found 31 cm from the incisors. This stenosis       measured 3 mm (inner diameter). The stenosis was unable to be traversed.       Dilatation at this point, with significant inflammation, was felt to be       risk-prohibitive. Impression:               - Moderately severe esophagitis with no bleeding.                            Biopsied.                           - Very tight, ~3 cm diameter, distal esophageal                            stricture.                           -  Feeding difficulties (inability to tolerate                            solids or liquids). Moderate Sedation:      None Recommendation:           - Admit the patient to hospital ward for ongoing                            care (IV PPI, IV diflucan, parenteral nutrition;                            after few days, might consider repeat endoscopy                            with dilatation under fluoroscopy). I have                            discussed with hospitalist team, who have                            graciously agreed to admit patient.                           - Sips ice chips.                           [facility] will follow. Procedure Code(s):        --- Professional ---                            [phone], Esophagoscopy, flexible, transoral; with                            biopsy, single or multiple Diagnosis Code(s):        --- Professional ---                           K20.90, Esophagitis, unspecified without bleeding                           K22.2, Esophageal obstruction                           R13.10, Dysphagia, unspecified CPT copyright 2019 [employer]. All rights reserved. The codes documented in this report are preliminary and upon coder review may  be revised to meet current compliance requirements. [name], MD 05/27/2019 11:54:40 AM This report has been signed electronically. Number of Addenda: 0

## 2019-05-27 NOTE — Progress Notes (Signed)
Pt is very anxious found crying in the room, c/o pain and anxiety appropriate for situation. Emotional support given. MD is notified via secure chat. New orders received.

## 2019-05-27 NOTE — Anesthesia Postprocedure Evaluation (Signed)
Anesthesia Post Note  Patient: [name]  Procedure(s) Performed: ESOPHAGOGASTRODUODENOSCOPY (EGD) WITH PROPOFOL (N/A ) BIOPSY     Patient location during evaluation: Endoscopy Anesthesia Type: MAC Level of consciousness: awake and alert Pain management: pain level controlled Vital Signs Assessment: post-procedure vital signs reviewed and stable Respiratory status: spontaneous breathing, nonlabored ventilation, respiratory function stable and patient connected to nasal cannula oxygen Cardiovascular status: blood pressure returned to baseline and stable Postop Assessment: no apparent nausea or vomiting Anesthetic complications: no    Last Vitals:  Vitals:   05/27/19 1210 05/27/19 1236  BP: 104/63 (!) 128/91  Pulse: (!) 58 (!) 52  Resp: 14 18  Temp:  (!) 36.4 C  SpO2: 95% 99%    Last Pain:  Vitals:   05/27/19 1331  TempSrc:   PainSc: 8                  Lucile Didonato L Zunaira Lamy

## 2019-05-27 NOTE — Transfer of Care (Signed)
Immediate Anesthesia Transfer of Care Note  Patient: Ralph [name]  Procedure(s) Performed: ESOPHAGOGASTRODUODENOSCOPY (EGD) WITH PROPOFOL (N/A ) BIOPSY  Patient Location: Endoscopy Unit  Anesthesia Type:MAC  Level of Consciousness: awake, alert , oriented and patient cooperative  Airway & Oxygen Therapy: Patient Spontanous Breathing and Patient connected to face mask oxygen  Post-op Assessment: Report given to RN and Post -op Vital signs reviewed and stable  Post vital signs: Reviewed and stable  Last Vitals:  Vitals Value Taken Time  BP    Temp    Pulse    Resp    SpO2      Last Pain:  Vitals:   05/27/19 0940  TempSrc: Oral  PainSc: 5          Complications: No apparent anesthesia complications

## 2019-05-27 NOTE — Progress Notes (Signed)
Pt is c/o pain 8/10 in the abdominal area. MD is notified via secure chat. Orders received.

## 2019-05-27 NOTE — Progress Notes (Signed)
Initial Nutrition Assessment  DOCUMENTATION CODES:   Not applicable  INTERVENTION:  TPN per pharmacist   NUTRITION DIAGNOSIS:   Inadequate oral intake related to dysphagia(moderately severe esophagitis with distal esophageal stricture) as evidenced by NPO status.   GOAL:   Patient will meet greater than or equal to 90% of their needs  MONITOR:   Labs, I & O's, Weight trends  REASON FOR ASSESSMENT:   Malnutrition Screening Tool, Consult New TPN/TNA  ASSESSMENT:  RD working remotely.   54 year old male with past medical history significant of unspecified CVA, CAD/MI, HTN, HLD, recent admission for worsening dysphagia in March 2021 and found to have severe ulcerative esophagitis with a mass noted in the distal esophagus s/p biopsies that revealed Barrett's esophagus without malignancy and was discharged on oral Protonix. Patient presented today for outpatient endoscopy and found to have severe esophagitis.  Patient admitted on 4/21 for severe esophagitis, RD consulted for new TNA/TPN.  Per chart review, pt reports having a very hard time swallowing solid foods and liquids, endorses ~30 lb wt loss over the past few months and complains of intermittent nausea with occasional vomiting. Given this, highly suspect malnutrition however unable to identify at this time, will plan on completing NFPE at follow up to assess for suspected fat/muscle depletions.   Per notes: -moderately severe esophagitis with distal esophageal stricture -PICC placed, R brachial -IV PPI and Diflucan -possible repeat EGD for possible dilation of stricture in the next few days  Current wt 134.64 lbs Limited recent wt history for review, weights have been stable 134-136 over the past 6 weeks. No weights available in 2020, noted stable 164-165 lbs from August 2018 - September 2019.   Medications reviewed and include: SSI, Protonix IVF: D5 NaCl @ 100 ml/hr providing 408 kcal IVPB: Diflucan 200 mg on  4/22 Labs reviewed  NUTRITION - FOCUSED PHYSICAL EXAM: Unable to complete at this time, RD working remotely.  Diet Order:   Diet Order            Diet NPO time specified Except for: Ice Chips, Other (See Comments)  Diet effective now              EDUCATION NEEDS:   No education needs have been identified at this time  Skin:  Skin Assessment: Reviewed RN Assessment  Last BM:  4/21  Height:   Ht Readings from Last 1 Encounters:  05/27/19 5\' 8"  (1.727 m)    Weight:   Wt Readings from Last 1 Encounters:  05/27/19 61.2 kg    Ideal Body Weight:  70 kg  BMI:  Body mass index is 20.53 kg/m.  Estimated Nutritional Needs:   Kcal:  05/29/19  Protein:  90-110  Fluid:  >/= 1.8 L/day   [id number], RD, LDN Clinical Nutrition After Hours/Weekend Pager # in Amion

## 2019-05-28 LAB — TRIGLYCERIDES
Triglycerides: UNDETERMINED mg/dL (ref <150)
Triglycerides: 102 mg/dL (ref <150)

## 2019-05-28 LAB — CBC
HCT: 38.3 % — ABNORMAL LOW (ref 39.0–52.0)
Hemoglobin: 12.6 g/dL — ABNORMAL LOW (ref 13.0–17.0)
MCH: 32 pg (ref 26.0–34.0)
MCHC: 32.9 g/dL (ref 30.0–36.0)
MCV: 97.2 fL (ref 80.0–100.0)
Platelets: 158 10*3/uL (ref 150–400)
RBC: 3.94 MIL/uL — ABNORMAL LOW (ref 4.22–5.81)
RDW: 13 % (ref 11.5–15.5)
WBC: 7.4 10*3/uL (ref 4.0–10.5)
nRBC: 0 % (ref 0.0–0.2)

## 2019-05-28 LAB — COMPREHENSIVE METABOLIC PANEL
ALT: 11 U/L (ref 0–44)
AST: 15 U/L (ref 15–41)
Albumin: 3.3 g/dL — ABNORMAL LOW (ref 3.5–5.0)
Alkaline Phosphatase: 72 U/L (ref 38–126)
Anion gap: 5 (ref 5–15)
BUN: 8 mg/dL (ref 6–20)
CO2: 25 mmol/L (ref 22–32)
Calcium: 8.7 mg/dL — ABNORMAL LOW (ref 8.9–10.3)
Chloride: 106 mmol/L (ref 98–111)
Creatinine, Ser: 0.71 mg/dL (ref 0.61–1.24)
GFR calc Af Amer: >60 mL/min (ref >60)
GFR calc non Af Amer: >60 mL/min (ref >60)
Glucose, Bld: 117 mg/dL — ABNORMAL HIGH (ref 70–99)
Potassium: 4 mmol/L (ref 3.5–5.1)
Sodium: 136 mmol/L (ref 135–145)
Total Bilirubin: 0.7 mg/dL (ref 0.3–1.2)
Total Protein: 6 g/dL — ABNORMAL LOW (ref 6.5–8.1)

## 2019-05-28 LAB — DIFFERENTIAL
Abs Immature Granulocytes: 0.02 10*3/uL (ref 0.00–0.07)
Basophils Absolute: 0.1 10*3/uL (ref 0.0–0.1)
Basophils Relative: 1 %
Eosinophils Absolute: 0.4 10*3/uL (ref 0.0–0.5)
Eosinophils Relative: 5 %
Immature Granulocytes: 0 %
Lymphocytes Relative: 16 %
Lymphs Abs: 1.2 10*3/uL (ref 0.7–4.0)
Monocytes Absolute: 0.7 10*3/uL (ref 0.1–1.0)
Monocytes Relative: 9 %
Neutro Abs: 5 10*3/uL (ref 1.7–7.7)
Neutrophils Relative %: 69 %

## 2019-05-28 LAB — PHOSPHORUS: Phosphorus: 2.7 mg/dL (ref 2.5–4.6)

## 2019-05-28 LAB — SURGICAL PATHOLOGY

## 2019-05-28 LAB — MAGNESIUM: Magnesium: 1.9 mg/dL (ref 1.7–2.4)

## 2019-05-28 LAB — PREALBUMIN: Prealbumin: 14.5 mg/dL — ABNORMAL LOW (ref 18–38)

## 2019-05-28 MED ORDER — THIAMINE HCL 100 MG/ML IJ SOLN: 100.0000 mg | Freq: Every day | INTRAMUSCULAR | Status: DC

## 2019-05-28 MED ORDER — LORAZEPAM 1 MG PO TABS
1.0000 mg | ORAL_TABLET | ORAL | Status: AC | PRN
  Filled 2019-05-28: qty 2

## 2019-05-28 MED ORDER — TRAVASOL 10 % IV SOLN
INTRAVENOUS | Status: AC
  Filled 2019-05-28: qty 480

## 2019-05-28 MED ORDER — THIAMINE HCL 100 MG PO TABS: 100.0000 mg | ORAL_TABLET | Freq: Every day | ORAL | Status: DC

## 2019-05-28 MED ORDER — DEXTROSE-NACL 5-0.9 % IV SOLN: INTRAVENOUS | Status: AC

## 2019-05-28 MED ORDER — ADULT MULTIVITAMIN W/MINERALS CH: 1.0000 | ORAL_TABLET | Freq: Every day | ORAL | Status: DC

## 2019-05-28 MED ORDER — LORAZEPAM 2 MG/ML IJ SOLN
1.0000 mg | INTRAMUSCULAR | Status: AC | PRN
  Administered 2019-05-28 – 2019-05-29 (×3): 2 mg via INTRAVENOUS
  Administered 2019-05-29: 1 mg via INTRAVENOUS
  Administered 2019-05-29 – 2019-05-31 (×6): 2 mg via INTRAVENOUS
  Administered 2019-05-31: 1 mg via INTRAVENOUS
  Administered 2019-05-31: 2 mg via INTRAVENOUS
  Filled 2019-05-28 (×12): qty 1

## 2019-05-28 MED ORDER — FOLIC ACID 1 MG PO TABS: 1.0000 mg | ORAL_TABLET | Freq: Every day | ORAL | Status: DC

## 2019-05-28 NOTE — Progress Notes (Addendum)
[facility] Gastroenterology Progress Note  Ralph [name] 54 y.o. 11-Mar-1965 Patient is a 54 year old male who underwent EGD yesterday for progressive dysphagia, esophagitis, and esophageal mass (biopsies non diagnostic for malignancy).  EGD was pertinent for moderately severe esophagitis with no bleeding (biopsied, path pending) and very tight (~3 cm diameter) distal esophageal stricture.  CC:  Esophagitis, dysphagia  Subjective: Patient tearful and endorsing stress related to hospitalization.  He endorses continued odynophagia, dysphagia, chest pain.  He denies abdominal pain.  He had regular denies melena or hematochezia.  ROS : Review of Systems  Constitutional: Negative for chills and fever.  HENT: Positive for sore throat (odynophagia).   Cardiovascular: Positive for chest pain. Negative for palpitations.  Gastrointestinal: Positive for heartburn. Negative for abdominal pain, blood in stool, constipation, diarrhea, melena, nausea and vomiting.    Objective: Vital signs in last 24 hours: Vitals:   05/28/19 0400 05/28/19 0549  BP: 140/67 (!) 142/68  Pulse: (!) 109 (!) 107  Resp: 18 18  Temp: 97.6 F (36.4 C) 97.7 F (36.5 C)  SpO2: 96% 98%    Physical Exam:  General:  Lethargic, thin, tearful  Head:  Normocephalic, without obvious abnormality, atraumatic  Eyes:  Anicteric sclera, EOMs intact  Lungs:   Clear to auscultation bilaterally, respirations unlabored  Heart:  Mildly tachycardic with regular and rhythm, S1, S2 normal  Abdomen:   Soft, non-distended, mild epigastric tenderness to palpation, normoactive bowel sounds,  no peritoneal signs  Extremities: Extremities normal, atraumatic, no  edema  Pulses: 2+ and symmetric    Lab Results: Recent Labs    05/27/19 1309 05/28/19 0840  NA 135 136  K 4.6 4.0  CL 97* 106  CO2 27 25  GLUCOSE 92 117*  BUN 8 8  CREATININE 0.83 0.71  CALCIUM 9.7 8.7*  MG  --  1.9  PHOS  --  2.7   Recent Labs    05/27/19 1309  05/28/19 0840  AST 18 15  ALT 13 11  ALKPHOS 86 72  BILITOT 0.9 0.7  PROT 7.3 6.0*  ALBUMIN 4.1 3.3*   Recent Labs    05/27/19 1309 05/28/19 0610  WBC 10.1 7.4  NEUTROABS 6.6 5.0  HGB 16.7 12.6*  HCT 50.0 38.3*  MCV 94.2 97.2  PLT 226 158   No results for input(s): LABPROT, INR in the last 72 hours.    Assessment/Plan: Moderately severe esophagitis without bleeding.   Dysphagia secondary to tight (~3 cm diameter) distal esophageal stricture. -Hgb decreased to 12.6 today from 16.7 yesterday.  Part of this may be dilutional.  No active bleeding per EGD. Continue to monitor H&H. -Continue IV Protonix q12h -Continue diflucan IV qd -Await pathology results -Plan for repeat endoscopy with dilatation under fluoroscopy on Monday - Sips/ice chips   [facility] will follow.  Edrick Kins PA-C 05/28/2019, 9:53 AM  Contact #  [phone]

## 2019-05-28 NOTE — Progress Notes (Signed)
PROGRESS NOTE    Ralph [name]  JSH:[id number] DOB: 27-Jan-1966 DOA: 05/27/2019 PCP: Moses Manners, MD   Brief Narrative:  54 year old with history of unspecified CVA, CAD, HTN, HLD diagnosed with severe ulcerative esophagitis about a month ago placed on PPI.  Continue to have worsening of dysphagia to solids and liquids.  He has lost about 30 pounds in the last several months.  GI performed repeat endoscopy is still showing severe esophagitis.  Admitted for nutritional support with PICC line and TPN   Assessment & Plan:   Principal Problem:   Esophagitis Active Problems:   Essential hypertension, benign   Dysphagia   Weight loss   Tobacco use disorder   Dehydration  Moderate severe esophagitis with distal esophageal stricture Dysphagia-solids and liquids Significant weight loss with severe protein calorie malnutrition -Previous EGD has shown severe esophagitis status post biopsy.  Has distal stricture. -Started on IV fluid, IV PPI, Diflucan.  Stop D5 fluids once his TPN starts -PICC line placement, TPN.  Monitor for refeeding syndrome. -Biopsy results per GI. -Nutritional support.  Maintain n.p.o. for now.  Essential hypertension -P.o. meds on hold, IV hydralazine as needed  Hyperlipidemia -Statin on hold  Tobacco use Daily alcohol use -Counseled to quit using this. -Alcohol withdrawal protocol    Vital signs patient had sinus bradycardia per the nursing staff overnight.  I asked the patient to be placed on telemetry but patient is refusing it.  He tells me he does not want his bradycardia to be evaluated at this time, he tells me same thing happened when he was admitted at [facility] about a month ago.  He understands unevaluated bradycardia can be life-threatening.   DVT prophylaxis: Lovenox Code Status: Full code Family Communication:   Disposition Plan:   Patient From= home  Patient Anticipated D/C place= Home  Barriers= maintain hospital stay for IV  nutritional feeding with PICC line/TPN.  Closely monitor for refeeding syndrome.  Unsafe for discharge at this time.   Subjective: Overnight episode of bradycardia when nursing staff checked his vital signs.  This morning when I saw him at bedside he is reporting of esophageal pain which has been present for several weeks now.  Review of Systems Otherwise negative except as per HPI, including: General: Denies fever, chills, night sweats or unintended weight loss. Resp: Denies cough, wheezing, shortness of breath. Cardiac: Denies chest pain, palpitations, orthopnea, paroxysmal nocturnal dyspnea. GI: Denies  nausea, vomiting, diarrhea or constipation GU: Denies dysuria, frequency, hesitancy or incontinence MS: Denies muscle aches, joint pain or swelling Neuro: Denies headache, neurologic deficits (focal weakness, numbness, tingling), abnormal gait Psych: Denies anxiety, depression, SI/HI/AVH Skin: Denies new rashes or lesions ID: Denies sick contacts, exotic exposures, travel  Examination:  General exam: Appears calm and comfortable  Respiratory system: Clear to auscultation. Respiratory effort normal. Cardiovascular system: S1 & S2 heard, RRR. No JVD, murmurs, rubs, gallops or clicks. No pedal edema. Gastrointestinal system: Abdomen is nondistended, soft and nontender. No organomegaly or masses felt. Normal bowel sounds heard. Central nervous system: Alert and oriented. No focal neurological deficits. Extremities: Symmetric 5 x 5 power. Skin: No rashes, lesions or ulcers Psychiatry: Judgement and insight appear normal. Mood & affect appropriate.     Objective: Vitals:   05/28/19 0042 05/28/19 0356 05/28/19 0400 05/28/19 0549  BP: 118/67 136/78 140/67 (!) 142/68  Pulse: (!) 36 (!) 42 (!) 109 (!) 107  Resp: 14 17 18 18   Temp: 97.8 F (36.6 C) 97.7 F (36.5 C)  97.6 F (36.4 C) 97.7 F (36.5 C)  TempSrc: Axillary Oral Oral Oral  SpO2: 96% 96% 96% 98%  Weight:      Height:         Intake/Output Summary (Last 24 hours) at 05/28/2019 0852 Last data filed at 05/28/2019 1610 Gross per 24 hour  Intake 2471.32 ml  Output 565 ml  Net 1906.32 ml   Filed Weights   05/20/19 1301 05/27/19 0940  Weight: 61.2 kg 61.2 kg     Data Reviewed:   CBC: Recent Labs  Lab 05/27/19 1309 05/28/19 0610  WBC 10.1 7.4  NEUTROABS 6.6 5.0  HGB 16.7 12.6*  HCT 50.0 38.3*  MCV 94.2 97.2  PLT 226 158   Basic Metabolic Panel: Recent Labs  Lab 05/27/19 1309  NA 135  K 4.6  CL 97*  CO2 27  GLUCOSE 92  BUN 8  CREATININE 0.83  CALCIUM 9.7   GFR: Estimated Creatinine Clearance: 91.1 mL/min (by C-G formula based on SCr of 0.83 mg/dL). Liver Function Tests: Recent Labs  Lab 05/27/19 1309  AST 18  ALT 13  ALKPHOS 86  BILITOT 0.9  PROT 7.3  ALBUMIN 4.1   No results for input(s): LIPASE, AMYLASE in the last 168 hours. No results for input(s): AMMONIA in the last 168 hours. Coagulation Profile: No results for input(s): INR, PROTIME in the last 168 hours. Cardiac Enzymes: No results for input(s): CKTOTAL, CKMB, CKMBINDEX, TROPONINI in the last 168 hours. BNP (last 3 results) No results for input(s): PROBNP in the last 8760 hours. HbA1C: No results for input(s): HGBA1C in the last 72 hours. CBG: No results for input(s): GLUCAP in the last 168 hours. Lipid Profile: Recent Labs    05/28/19 0610  TRIG SPECIMEN CONTAMINATED, UNABLE TO PERFORM TEST(S).   Thyroid Function Tests: No results for input(s): TSH, T4TOTAL, FREET4, T3FREE, THYROIDAB in the last 72 hours. Anemia Panel: No results for input(s): VITAMINB12, FOLATE, FERRITIN, TIBC, IRON, RETICCTPCT in the last 72 hours. Sepsis Labs: No results for input(s): PROCALCITON, LATICACIDVEN in the last 168 hours.  Recent Results (from the past 240 hour(s))  SARS CORONAVIRUS 2 (TAT 6-24 HRS) Nasopharyngeal Nasopharyngeal Swab     Status: None   Collection Time: 05/23/19  2:04 PM   Specimen: Nasopharyngeal Swab    Result Value Ref Range Status   SARS Coronavirus 2 NEGATIVE NEGATIVE Final    Comment: (NOTE) SARS-CoV-2 target nucleic acids are NOT DETECTED. The SARS-CoV-2 RNA is generally detectable in upper and lower respiratory specimens during the acute phase of infection. Negative results do not preclude SARS-CoV-2 infection, do not rule out co-infections with other pathogens, and should not be used as the sole basis for treatment or other patient management decisions. Negative results must be combined with clinical observations, patient history, and epidemiological information. The expected result is Negative. Fact Sheet for Patients: HairSlick.no Fact Sheet for Healthcare Providers: quierodirigir.com This test is not yet approved or cleared by the [place] FDA and  has been authorized for detection and/or diagnosis of SARS-CoV-2 by FDA under an Emergency Use Authorization (EUA). This EUA will remain  in effect (meaning this test can be used) for the duration of the COVID-19 declaration under Section 56 4(b)(1) of the Act, 21 U.S.C. section 360bbb-3(b)(1), unless the authorization is terminated or revoked sooner. Performed at [facility], [address], [place], [place] [address]          Radiology Studies: DG Abd 1 View  Result Date:  05/27/2019 CLINICAL DATA:  Recent endoscopy, abdominal pain EXAM: ABDOMEN - 1 VIEW COMPARISON:  05/01/2019 FINDINGS: Two supine frontal views of the abdomen and pelvis are obtained. The lung bases are clear. Bowel gas pattern is unremarkable without obstruction or ileus. There are no masses or abnormal calcifications. Retained metallic BB right lower quadrant unchanged. No acute bony abnormalities. Extensive atherosclerosis. IMPRESSION: 1. Unremarkable bowel gas pattern. Electronically Signed   By: Randa Ngo M.D.   On: 05/27/2019 19:48   [place] EKG SITE RITE  Result Date: 05/27/2019 If  Site Rite image not attached, placement could not be confirmed due to current cardiac rhythm.       Scheduled Meds: . Chlorhexidine Gluconate Cloth  6 each Topical Daily  . enoxaparin (LOVENOX) injection  40 mg Subcutaneous Q24H  . [START ON 05/29/2019] insulin aspart  0-9 Units Subcutaneous Q4H  . nicotine  21 mg Transdermal Daily  . pantoprazole (PROTONIX) IV  40 mg Intravenous Q12H  . sodium chloride flush  10-40 mL Intracatheter Q12H   Continuous Infusions: . dextrose 5 % and 0.9% NaCl 100 mL/hr at 05/27/19 1331  . fluconazole (DIFLUCAN) IV       LOS: 1 day   Time spent= 35 mins    Brae Gartman Arsenio Loader, MD [facility]  If 7PM-7AM, please contact night-coverage  05/28/2019, 8:52 AM

## 2019-05-28 NOTE — Progress Notes (Signed)
PHARMACY - TOTAL PARENTERAL NUTRITION CONSULT NOTE   Indication: prolonged malnutrition with significant weight loss, intolerance to enteral feeding related to dysphagia  Patient Measurements: Height: 5\' 8"  (172.7 cm) Weight: 61.2 kg (135 lb) IBW/kg (Calculated) : 68.4 TPN AdjBW (KG): 61.2 Body mass index is 20.53 kg/m. Usual Weight: 77 kg  Assessment:  54 y/o M hospitalized in March for worsening dysphagia was found to have severe ulcerative esophagits, Barrett's esophagus. Patient reports very hard time swallowing solid foods and liquids with reported weight loss of 30 pounds. Pharmacy consulted to initiate TPN.   Glucose / Insulin: 117 (chemistry) Electrolytes: WNL Renal: SCr stable LFTs / TGs: WNL Prealbumin / albumin: 14.5/3.3 (4/22) Intake / Output; MIVF: net + 2 L, UOP poor GI Imaging: 4/21 EGD with moderate to severe esophagitis without bleeding Surgeries / Procedures:   Central access: 4/21 TPN start date: 4/22  Nutritional Goals (per RD recommendation on 4/21): kCal: 1800-2025, Protein: 90-110, Fluid: >/= 1.8 L/day Goal TPN rate is 80 mL/hr (provides 96 g of protein and 2006 kcals per day) with 17.5% dextrose and 25 g/L lipids  Current Nutrition:  NPO  Plan:  Start TPN at 80mL/hr at 1800 providing 1003 kcals and 48 g protein/24 hours Electrolytes in TPN: 34mEq/L of Na, 57mEq/L of K, 10mEq/L of Ca, 47mEq/L of Mg, and 60mmol/L of Phos. Cl:Ac ratio 1:1 Add standard MVI and trace elements to TPN daily Initiate Sensitive q4h SSI and adjust as needed  Reduce MIVF to 60 mL/hr at 1800 Monitor TPN labs on Mon/Thurs BMET, Mg, and Phos in AM  [place], [employer] D 05/28/2019,9:42 AM

## 2019-05-28 NOTE — Progress Notes (Signed)
Patient has total urine output of for 12HRs, Paged provider on call, waiting for further order, will continue to monitor pt.

## 2019-05-28 NOTE — Progress Notes (Signed)
   05/28/19 0042  Vitals  Temp 97.8 F (36.6 C)  Temp Source Axillary  BP 118/67  MAP (mmHg) 82  BP Location Left Arm  BP Method Automatic  Patient Position (if appropriate) Lying  Pulse Rate (!) 36  Pulse Rate Source Monitor  Resp 14  Oxygen Therapy  SpO2 96 %  O2 Device Room Air  MEWS Score  MEWS Temp 0  MEWS Systolic 0  MEWS Pulse 2  MEWS RR 0  MEWS LOC 0  MEWS Score 2  MEWS Score Color Yellow  Provider Notification  Provider Name/Title Cherylin Mylar  Date Provider Notified 05/28/19  Time Provider Notified [phone]  Notification Type Page  Notification Reason Change in status  Note  Observations pt is asleep. no signs of distress.  VS obtained, MEWS changed to yellow. MEWS yellow guidelines initated. Provider on call paged, waiting for further order at this time, RN charge nurse informed, will continue to monitor pt.

## 2019-05-29 ENCOUNTER — Ambulatory Visit: Payer: Self-pay

## 2019-05-29 ENCOUNTER — Telehealth: Payer: Self-pay

## 2019-05-29 ENCOUNTER — Encounter: Payer: Self-pay | Admitting: *Deleted

## 2019-05-29 DIAGNOSIS — R634 Abnormal weight loss: Secondary | ICD-10-CM

## 2019-05-29 LAB — COMPREHENSIVE METABOLIC PANEL
ALT: 11 U/L (ref 0–44)
AST: 15 U/L (ref 15–41)
Albumin: 3.3 g/dL — ABNORMAL LOW (ref 3.5–5.0)
Alkaline Phosphatase: 66 U/L (ref 38–126)
Anion gap: 7 (ref 5–15)
BUN: 6 mg/dL (ref 6–20)
CO2: 28 mmol/L (ref 22–32)
Calcium: 9 mg/dL (ref 8.9–10.3)
Chloride: 106 mmol/L (ref 98–111)
Creatinine, Ser: 0.72 mg/dL (ref 0.61–1.24)
GFR calc Af Amer: >60 mL/min (ref >60)
GFR calc non Af Amer: >60 mL/min (ref >60)
Glucose, Bld: 118 mg/dL — ABNORMAL HIGH (ref 70–99)
Potassium: 3.8 mmol/L (ref 3.5–5.1)
Sodium: 141 mmol/L (ref 135–145)
Total Bilirubin: 0.5 mg/dL (ref 0.3–1.2)
Total Protein: 5.7 g/dL — ABNORMAL LOW (ref 6.5–8.1)

## 2019-05-29 LAB — GLUCOSE, CAPILLARY
Glucose-Capillary: 119 mg/dL — ABNORMAL HIGH (ref 70–99)
Glucose-Capillary: 148 mg/dL — ABNORMAL HIGH (ref 70–99)
Glucose-Capillary: 75 mg/dL (ref 70–99)
Glucose-Capillary: 87 mg/dL (ref 70–99)
Glucose-Capillary: 91 mg/dL (ref 70–99)
Glucose-Capillary: 94 mg/dL (ref 70–99)
Glucose-Capillary: 96 mg/dL (ref 70–99)

## 2019-05-29 LAB — PHOSPHORUS: Phosphorus: 3.3 mg/dL (ref 2.5–4.6)

## 2019-05-29 LAB — MAGNESIUM: Magnesium: 2 mg/dL (ref 1.7–2.4)

## 2019-05-29 MED ORDER — MORPHINE SULFATE (PF) 2 MG/ML IV SOLN
2.0000 mg | Freq: Once | INTRAVENOUS | Status: AC
  Administered 2019-05-29: 2 mg via INTRAVENOUS
  Filled 2019-05-29: qty 1

## 2019-05-29 MED ORDER — THIAMINE HCL 100 MG/ML IJ SOLN
100.0000 mg | Freq: Every day | INTRAMUSCULAR | Status: AC
  Administered 2019-05-29: 100 mg via INTRAVENOUS
  Filled 2019-05-29: qty 2

## 2019-05-29 MED ORDER — TRAVASOL 10 % IV SOLN
INTRAVENOUS | Status: AC
  Filled 2019-05-29: qty 960

## 2019-05-29 MED ORDER — INSULIN ASPART 100 UNIT/ML ~~LOC~~ SOLN: 0.0000 [IU] | Freq: Four times a day (QID) | SUBCUTANEOUS | Status: DC

## 2019-05-29 MED ORDER — DEXTROSE-NACL 5-0.9 % IV SOLN: INTRAVENOUS | Status: DC

## 2019-05-29 MED ORDER — POTASSIUM CHLORIDE 10 MEQ/100ML IV SOLN
10.0000 meq | INTRAVENOUS | Status: AC
  Administered 2019-05-29 (×3): 10 meq via INTRAVENOUS
  Filled 2019-05-29 (×3): qty 100

## 2019-05-29 NOTE — Progress Notes (Signed)
[facility]  PROGRESS NOTE    Ralph [name]  EKC:[id number] DOB: 08-06-1965 DOA: 05/27/2019 PCP: Moses Manners, MD   Brief Narrative:   54 year old with history of unspecified CVA, CAD, HTN, HLD diagnosed with severe ulcerative esophagitis about a month ago placed on PPI.  Continue to have worsening of dysphagia to solids and liquids.  He has lost about 30 pounds in the last several months.  GI performed repeat endoscopy is still showing severe esophagitis.  Admitted for nutritional support with PICC line and TPN  4/23: Denies complaints today. TPN started. To have EGD w/ dilation on Monday. Continue current Tx.   Assessment & Plan:   Principal Problem:   Esophagitis Active Problems:   Essential hypertension, benign   Dysphagia   Weight loss   Tobacco use disorder   Dehydration  Moderate severe esophagitis with distal esophageal stricture Dysphagia w/ solids and liquids Significant weight loss with severe protein calorie malnutrition     - Previous EGD has shown severe esophagitis status post biopsy.  Has distal stricture.     - Bx positive for candida, negative for dysplasia or malignancy     - Started IV PPI, Diflucan. Started on TPN.     - plan for repeat EGD w/ dilation on 4/26  Essential hypertension     - home amlodipine on hold     - BP is ok  Hyperlipidemia     - home lipitor is on hold  Tobacco use Daily alcohol use     - consuled on abstinence     - CIWA  DVT prophylaxis: lovenox Code Status: FULL   Status is: Inpatient  Remains inpatient appropriate because:IV treatments appropriate due to intensity of illness or inability to take PO   Dispo: The patient is from: Home              Anticipated d/c is to: Home              Anticipated d/c date is: > 3 days              Patient currently is not medically stable to d/c.  Consultants:   GI  Procedures:   EGD  Antimicrobials:  . diflucan   ROS:  Denies CP, dyspnea, N, V . Remainder 10-pt ROS is  negative for all not previously mentioned.  Subjective: "I'm not sure when they are gonna do it."  Objective: Vitals:   05/28/19 1415 05/28/19 2058 05/29/19 0607 05/29/19 1309  BP: (!) 161/98 (!) 162/102 118/80 127/88  Pulse: (!) 45 (!) 53 (!) 43 (!) 57  Resp: 20 15 16 16   Temp: 97.8 F (36.6 C) 97.7 F (36.5 C) 98 F (36.7 C) 97.9 F (36.6 C)  TempSrc:  Oral Oral Oral  SpO2: 100% 100% 100% 100%  Weight:      Height:        Intake/Output Summary (Last 24 hours) at 05/29/2019 1431 Last data filed at 05/29/2019 1300 Gross per 24 hour  Intake 1376.57 ml  Output 2325 ml  Net -948.43 ml   Filed Weights   05/20/19 1301 05/27/19 0940  Weight: 61.2 kg 61.2 kg    Examination:  General: 54 y.o. male resting in bed in NAD Cardiovascular: brady, +S1, S2, no m/g/r, equal pulses throughout Respiratory: CTABL, no w/r/r, normal WOB GI: BS+, ND, no masses noted, no organomegaly noted, LUQ to epigastric TTP MSK: No e/c/c Neuro: alert to name, follows commands Psyc: calm/cooperative  Data Reviewed: I  have personally reviewed following labs and imaging studies.  CBC: Recent Labs  Lab 05/27/19 1309 05/28/19 0610  WBC 10.1 7.4  NEUTROABS 6.6 5.0  HGB 16.7 12.6*  HCT 50.0 38.3*  MCV 94.2 97.2  PLT 226 158   Basic Metabolic Panel: Recent Labs  Lab 05/27/19 1309 05/28/19 0840 05/29/19 0454  NA 135 136 141  K 4.6 4.0 3.8  CL 97* 106 106  CO2 27 25 28   GLUCOSE 92 117* 118*  BUN 8 8 6   CREATININE 0.83 0.71 0.72  CALCIUM 9.7 8.7* 9.0  MG  --  1.9 2.0  PHOS  --  2.7 3.3   GFR: Estimated Creatinine Clearance: 94.6 mL/min (by C-G formula based on SCr of 0.72 mg/dL). Liver Function Tests: Recent Labs  Lab 05/27/19 1309 05/28/19 0840 05/29/19 0454  AST 18 15 15   ALT 13 11 11   ALKPHOS 86 72 66  BILITOT 0.9 0.7 0.5  PROT 7.3 6.0* 5.7*  ALBUMIN 4.1 3.3* 3.3*   No results for input(s): LIPASE, AMYLASE in the last 168 hours. No results for input(s): AMMONIA in the  last 168 hours. Coagulation Profile: No results for input(s): INR, PROTIME in the last 168 hours. Cardiac Enzymes: No results for input(s): CKTOTAL, CKMB, CKMBINDEX, TROPONINI in the last 168 hours. BNP (last 3 results) No results for input(s): PROBNP in the last 8760 hours. HbA1C: No results for input(s): HGBA1C in the last 72 hours. CBG: Recent Labs  Lab 05/29/19 0014 05/29/19 0410 05/29/19 0743 05/29/19 1134  GLUCAP 148* 96 119* 94   Lipid Profile: Recent Labs    05/28/19 0610 05/28/19 0840  TRIG SPECIMEN CONTAMINATED, UNABLE TO PERFORM TEST(S). 102   Thyroid Function Tests: No results for input(s): TSH, T4TOTAL, FREET4, T3FREE, THYROIDAB in the last 72 hours. Anemia Panel: No results for input(s): VITAMINB12, FOLATE, FERRITIN, TIBC, IRON, RETICCTPCT in the last 72 hours. Sepsis Labs: No results for input(s): PROCALCITON, LATICACIDVEN in the last 168 hours.  Recent Results (from the past 240 hour(s))  SARS CORONAVIRUS 2 (TAT 6-24 HRS) Nasopharyngeal Nasopharyngeal Swab     Status: None   Collection Time: 05/23/19  2:04 PM   Specimen: Nasopharyngeal Swab  Result Value Ref Range Status   SARS Coronavirus 2 NEGATIVE NEGATIVE Final    Comment: (NOTE) SARS-CoV-2 target nucleic acids are NOT DETECTED. The SARS-CoV-2 RNA is generally detectable in upper and lower respiratory specimens during the acute phase of infection. Negative results do not preclude SARS-CoV-2 infection, do not rule out co-infections with other pathogens, and should not be used as the sole basis for treatment or other patient management decisions. Negative results must be combined with clinical observations, patient history, and epidemiological information. The expected result is Negative. Fact Sheet for Patients: 05/31/19 Fact Sheet for Healthcare Providers: 05/30/19 This test is not yet approved or cleared by the 05/30/19 FDA  and  has been authorized for detection and/or diagnosis of SARS-CoV-2 by FDA under an Emergency Use Authorization (EUA). This EUA will remain  in effect (meaning this test can be used) for the duration of the COVID-19 declaration under Section 56 4(b)(1) of the Act, 21 U.S.C. section 360bbb-3(b)(1), unless the authorization is terminated or revoked sooner. Performed at [facility], [address] [place], [place] [facility]       Radiology Studies: DG Abd 1 View  Result Date: 05/27/2019 CLINICAL DATA:  Recent endoscopy, abdominal pain EXAM: ABDOMEN - 1 VIEW COMPARISON:  05/01/2019 FINDINGS: Two supine frontal views  of the abdomen and pelvis are obtained. The lung bases are clear. Bowel gas pattern is unremarkable without obstruction or ileus. There are no masses or abnormal calcifications. Retained metallic BB right lower quadrant unchanged. No acute bony abnormalities. Extensive atherosclerosis. IMPRESSION: 1. Unremarkable bowel gas pattern. Electronically Signed   By: Randa Ngo M.D.   On: 05/27/2019 19:48     Scheduled Meds: . Chlorhexidine Gluconate Cloth  6 each Topical Daily  . enoxaparin (LOVENOX) injection  40 mg Subcutaneous Q24H  . insulin aspart  0-9 Units Subcutaneous Q6H  . nicotine  21 mg Transdermal Daily  . pantoprazole (PROTONIX) IV  40 mg Intravenous Q12H  . sodium chloride flush  10-40 mL Intracatheter Q12H   Continuous Infusions: . dextrose 5 % and 0.9% NaCl 60 mL/hr at 05/28/19 2248  . dextrose 5 % and 0.9% NaCl    . fluconazole (DIFLUCAN) IV Stopped (05/29/19 1116)  . TPN ADULT (ION) 40 mL/hr at 05/29/19 1200  . TPN ADULT (ION)       LOS: 2 days    Time spent: 25 minutes spent in the coordination of care today.    Jonnie Finner, DO [facility]  If 7PM-7AM, please contact night-coverage [email or web address] 05/29/2019, 2:31 PM

## 2019-05-29 NOTE — Progress Notes (Addendum)
[facility] Gastroenterology Progress Note  Ralph [name] 54 y.o. [birth year] Patient is a 54 year old male who underwent EGD on 4/21 for progressive dysphagia, esophagitis, and esophageal mass (biopsies non diagnostic for malignancy).  EGD was pertinent for moderately severe esophagitis with no bleeding (bx: candidal esophagitis) and very tight (~3 cm diameter) distal esophageal stricture.  CC:  Esophagitis, dysphagia  Subjective: Patient reports continued esophageal discomfort and chest pain.  He does note that he is able to tolerate ice chips better today.  He denies any abdominal pain, nausea, vomiting.  ROS : Review of Systems  Constitutional: Negative for chills and fever.  HENT: Positive for sore throat (odynophagia).   Cardiovascular: Positive for chest pain. Negative for palpitations.  Gastrointestinal: Positive for heartburn. Negative for abdominal pain, blood in stool, constipation, diarrhea, melena, nausea and vomiting.    Objective: Vital signs in last 24 hours: Vitals:   05/28/19 2058 05/29/19 0607  BP: (!) 162/102 118/80  Pulse: (!) 53 (!) 43  Resp: 15 16  Temp: 97.7 F (36.5 C) 98 F (36.7 C)  SpO2: 100% 100%    Physical Exam:  General:  Lethargic, thin, oriented, no acute distress  Head:  Normocephalic, without obvious abnormality, atraumatic  Eyes:  Anicteric sclera, EOMs intact  Lungs:   Clear to auscultation bilaterally, respirations unlabored  Heart:  Mildly tachycardic with regular and rhythm, S1, S2 normal  Abdomen:   Soft, non-distended, mild epigastric tenderness to palpation, normoactive bowel sounds,  no peritoneal signs  Extremities: Extremities normal, atraumatic, no  edema  Pulses: 2+ and symmetric    Lab Results: Recent Labs    05/28/19 0840 05/29/19 0454  NA 136 141  K 4.0 3.8  CL 106 106  CO2 25 28  GLUCOSE 117* 118*  BUN 8 6  CREATININE 0.71 0.72  CALCIUM 8.7* 9.0  MG 1.9 2.0  PHOS 2.7 3.3   Recent Labs    05/28/19 0840  05/29/19 0454  AST 15 15  ALT 11 11  ALKPHOS 72 66  BILITOT 0.7 0.5  PROT 6.0* 5.7*  ALBUMIN 3.3* 3.3*   Recent Labs    05/27/19 1309 05/28/19 0610  WBC 10.1 7.4  NEUTROABS 6.6 5.0  HGB 16.7 12.6*  HCT 50.0 38.3*  MCV 94.2 97.2  PLT 226 158   No results for input(s): LABPROT, INR in the last 72 hours.    Assessment/Plan: Moderately severe esophagitis without bleeding.   Dysphagia secondary to tight (~3 cm diameter) distal esophageal stricture. Biopsy positive for candidal esophagitis; negative for dysplasia or malignancy in the submitted biopsies; comment: Rebiopsy is suggested if clinically suspicious.  -Continue IV Protonix q12h -Continue diflucan IV qd -Plan for repeat endoscopy with dilation under fluoroscopy on Monday, possible repeat biopsies.  I thoroughly reviewed procedure, benefits, risks (to include but not limited to bleeding, perforation, infection) with the patient.  Patient is in agreement to proceed with EGD/dilation on Monday.   [facility] will follow.  Edrick Kins PA-C 05/29/2019, 8:40 AM  Contact #  [phone]

## 2019-05-29 NOTE — Progress Notes (Addendum)
PHARMACY - TOTAL PARENTERAL NUTRITION CONSULT NOTE   Indication: prolonged malnutrition with significant weight loss, intolerance to enteral feeding related to dysphagia  Patient Measurements: Height: 5\' 8"  (172.7 cm) Weight: 61.2 kg (135 lb) IBW/kg (Calculated) : 68.4 TPN AdjBW (KG): 61.2 Body mass index is 20.53 kg/m. Usual Weight: 77 kg  Assessment:  54 y/o M hospitalized in March for worsening dysphagia was found to have severe ulcerative esophagits, Barrett's esophagus. Patient reports very hard time swallowing solid foods and liquids with reported weight loss of 30 pounds. Pharmacy consulted to initiate TPN.   Glucose / Insulin: CBGs 96-148, 1 unit SSI Electrolytes: WNL Renal: SCr stable LFTs / TGs: WNL Prealbumin / albumin: 14.5 (4/22)/3.3  Intake / Output; MIVF: net + 875 mL, UOP 1.5 GI Imaging: 4/21 EGD with moderate to severe esophagitis without bleeding Surgeries / Procedures: EGD dilation planned for early next week  Central access: 4/21 TPN start date: 4/22  Nutritional Goals (per RD recommendation on 4/21): kCal: 1800-2025, Protein: 90-110, Fluid: >/= 1.8 L/day Goal TPN rate is 80 mL/hr (provides 96 g of protein and 2006 kcals per day) with 17.5% dextrose and 25 g/L lipids  Current Nutrition:  TPN at 52mL/hr providing 1003 kcals and 48 g protein  Plan:  Now:  KCl 10 meq/100 ml x 3 runs  At 1800: Increase TPN to goal rate at 65mL/hr at 1800 providing 2006 kcals, and 96 g protein/24 hours Electrolytes in TPN: 65mEq/L of Na, 66mEq/L of K, 24mEq/L of Ca, 71mEq/L of Mg, and 107mmol/L of Phos. Cl:Ac ratio 1:1 Add standard MVI, trace elements, folic acid, and thiamine to TPN daily Initiate Sensitive q4h SSI and adjust as needed- reduce to q 6 h if CBGs remain at goal Reduce MIVF to 20 mL/hr at 1800 Monitor TPN labs on Mon/Thurs BMET, Mg, and Phos in AM  [place], [employer] D 05/29/2019,7:12 AM

## 2019-05-29 NOTE — Chronic Care Management (AMB) (Signed)
  Chronic Care Management   Note  05/29/2019 Name: Ralph [name] MRN: [id number] DOB: May 21, 1965  RNCM notified that the patient is in the hospital with Moderately severe esophagitis with distal esophageal stricture and abdominal pain. RNCM called appointment for today.  Follow up plan: RNCM will monitor patients progress  Juanell Fairly RN, BSN, [facility] Care Management Coordinator [facility] Phone: [phone] Fax: [phone]

## 2019-05-29 NOTE — Progress Notes (Signed)
Pharmacy Antibiotic Note  Ralph [name] is a 54 y.o. male admitted on 05/27/2019 with severe esophagitis.  Pharmacy has been consulted for fluconazole dosing.  Most recent QTc WNL No drug interactions noted with IP meds  05/29/19 7:49 AM  - not on tele floor - K 3.8, Mg 2  Plan:  Replaced KCl IV 10 meq x 3  Continue fluconazole 200 mg iv daily  May reduce clearance of several home meds (atorva, oxycodone, amlodipine) but d/t esophagitis likely won't be resuming these for a while    Height: 5\' 8"  (172.7 cm) Weight: 61.2 kg (135 lb) IBW/kg (Calculated) : 68.4  Temp (24hrs), Avg:97.8 F (36.6 C), Min:97.7 F (36.5 C), Max:98 F (36.7 C)  Recent Labs  Lab 05/27/19 1309 05/28/19 0610 05/28/19 0840 05/29/19 0454  WBC 10.1 7.4  --   --   CREATININE 0.83  --  0.71 0.72    Estimated Creatinine Clearance: 94.6 mL/min (by C-G formula based on SCr of 0.72 mg/dL).    No Known Allergies   Thank you for allowing pharmacy to be a part of this patient's care.  05/31/19 05/29/2019 7:47 AM

## 2019-05-30 DIAGNOSIS — R1010 Upper abdominal pain, unspecified: Secondary | ICD-10-CM

## 2019-05-30 LAB — CBC WITH DIFFERENTIAL/PLATELET
Abs Immature Granulocytes: 0.01 10*3/uL (ref 0.00–0.07)
Basophils Absolute: 0.1 10*3/uL (ref 0.0–0.1)
Basophils Relative: 1 %
Eosinophils Absolute: 0.5 10*3/uL (ref 0.0–0.5)
Eosinophils Relative: 8 %
HCT: 47.3 % (ref 39.0–52.0)
Hemoglobin: 15.7 g/dL (ref 13.0–17.0)
Immature Granulocytes: 0 %
Lymphocytes Relative: 27 %
Lymphs Abs: 1.7 10*3/uL (ref 0.7–4.0)
MCH: 32.2 pg (ref 26.0–34.0)
MCHC: 33.2 g/dL (ref 30.0–36.0)
MCV: 97.1 fL (ref 80.0–100.0)
Monocytes Absolute: 0.6 10*3/uL (ref 0.1–1.0)
Monocytes Relative: 10 %
Neutro Abs: 3.3 10*3/uL (ref 1.7–7.7)
Neutrophils Relative %: 54 %
Platelets: 201 10*3/uL (ref 150–400)
RBC: 4.87 MIL/uL (ref 4.22–5.81)
RDW: 12.7 % (ref 11.5–15.5)
WBC: 6.2 10*3/uL (ref 4.0–10.5)
nRBC: 0 % (ref 0.0–0.2)

## 2019-05-30 LAB — COMPREHENSIVE METABOLIC PANEL
ALT: 11 U/L (ref 0–44)
AST: 17 U/L (ref 15–41)
Albumin: 3.4 g/dL — ABNORMAL LOW (ref 3.5–5.0)
Alkaline Phosphatase: 67 U/L (ref 38–126)
Anion gap: 5 (ref 5–15)
BUN: 10 mg/dL (ref 6–20)
CO2: 26 mmol/L (ref 22–32)
Calcium: 8.9 mg/dL (ref 8.9–10.3)
Chloride: 106 mmol/L (ref 98–111)
Creatinine, Ser: 0.77 mg/dL (ref 0.61–1.24)
GFR calc Af Amer: >60 mL/min (ref >60)
GFR calc non Af Amer: >60 mL/min (ref >60)
Glucose, Bld: 107 mg/dL — ABNORMAL HIGH (ref 70–99)
Potassium: 4.4 mmol/L (ref 3.5–5.1)
Sodium: 137 mmol/L (ref 135–145)
Total Bilirubin: 0.3 mg/dL (ref 0.3–1.2)
Total Protein: 6.4 g/dL — ABNORMAL LOW (ref 6.5–8.1)

## 2019-05-30 LAB — GLUCOSE, CAPILLARY
Glucose-Capillary: 121 mg/dL — ABNORMAL HIGH (ref 70–99)
Glucose-Capillary: 116 mg/dL — ABNORMAL HIGH (ref 70–99)
Glucose-Capillary: 59 mg/dL — ABNORMAL LOW (ref 70–99)
Glucose-Capillary: 58 mg/dL — ABNORMAL LOW (ref 70–99)
Glucose-Capillary: 72 mg/dL (ref 70–99)

## 2019-05-30 LAB — PHOSPHORUS: Phosphorus: 4 mg/dL (ref 2.5–4.6)

## 2019-05-30 LAB — MAGNESIUM: Magnesium: 2.2 mg/dL (ref 1.7–2.4)

## 2019-05-30 MED ORDER — DEXTROSE 5 % IV SOLN: INTRAVENOUS | Status: DC

## 2019-05-30 MED ORDER — DEXTROSE 50 % IV SOLN
INTRAVENOUS | Status: AC
  Administered 2019-05-30 (×2): 25 mL
  Filled 2019-05-30: qty 50

## 2019-05-30 MED ORDER — TRAVASOL 10 % IV SOLN
INTRAVENOUS | Status: AC
  Filled 2019-05-30: qty 960

## 2019-05-30 MED ORDER — INSULIN ASPART 100 UNIT/ML ~~LOC~~ SOLN: 0.0000 [IU] | Freq: Three times a day (TID) | SUBCUTANEOUS | Status: DC

## 2019-05-30 MED ORDER — HYDRALAZINE HCL 20 MG/ML IJ SOLN: 10.0000 mg | Freq: Four times a day (QID) | INTRAMUSCULAR | Status: DC | PRN

## 2019-05-30 MED ORDER — HYDROMORPHONE HCL 1 MG/ML IJ SOLN
1.0000 mg | INTRAMUSCULAR | Status: DC | PRN
  Administered 2019-05-30 – 2019-06-02 (×14): 1 mg via INTRAVENOUS
  Filled 2019-05-30 (×15): qty 1

## 2019-05-30 NOTE — Progress Notes (Signed)
PHARMACY - TOTAL PARENTERAL NUTRITION CONSULT NOTE   Indication: prolonged malnutrition with significant weight loss, intolerance to enteral feeding related to dysphagia  Patient Measurements: Height: 5\' 8"  (172.7 cm) Weight: 61.2 kg (135 lb) IBW/kg (Calculated) : 68.4 TPN AdjBW (KG): 61.2 Body mass index is 20.53 kg/m. Usual Weight: 77 kg  Assessment:  54 y/o M hospitalized in March for worsening dysphagia was found to have severe ulcerative esophagits, Barrett's esophagus. Patient reports very hard time swallowing solid foods and liquids with reported weight loss of 30 pounds. Pharmacy consulted to initiate TPN.   Glucose / Insulin: CBGs at goal <150, no insulin needed in past 24h. Electrolytes: WNL Renal: SCr stable LFTs / TGs: WNL Prealbumin / albumin: 14.5 (4/22)/3.3  Intake / Output; MIVF: net -1.6L GI Imaging: 4/21 EGD with moderate to severe esophagitis without bleeding Surgeries / Procedures: EGD dilation planned for 4/26  Central access: 4/21 TPN start date: 4/22  Nutritional Goals (per RD recommendation on 4/21): kCal: 1800-2025, Protein: 90-110, Fluid: >/= 1.8 L/day Goal TPN rate is 80 mL/hr (provides 96 g of protein and 2006 kcals per day) with 17.5% dextrose and 25 g/L lipids  Current Nutrition:  TPN at goal rate  Plan:  At 1800: Continue TPN at goal rate 68mL/hr  Electrolytes in TPN: 30mEq/L of Na, 38mEq/L of K, 71mEq/L of Ca, 27mEq/L of Mg, and 60mmol/L of Phos. Cl:Ac ratio 1:1 Add standard MVI, trace elements, folic acid, and thiamine to TPN daily Decrease Sensitive SSI & CBG monitoring to q8h Reduce MIVF to 20 mL/hr at 1800 Monitor TPN labs on Mon/Thurs  12m PharmD, BCPS 05/30/2019,9:02 AM

## 2019-05-30 NOTE — Progress Notes (Signed)
Hypoglycemic Event  CBG: 59  Treatment: D 50 25 ml  Symptoms: Shaky and Nervous/irritable  Follow-up CBG: Time 1230 CBG Result:119  Possible Reasons for Event: Poor intake  Comments/MD notified:[id number] Dr Isidoro Donning, new orders    Ralph [name]

## 2019-05-30 NOTE — Progress Notes (Signed)
Hypoglycemic Event  CBG:59 Treatment:D 50 25 ml   Symptoms: shakey  Follow-up CBG: Time:1610 CBG Result:72  Possible Reasons for Event: no intake  Comments/MD notified:    Idamae Lusher

## 2019-05-30 NOTE — Progress Notes (Signed)
[facility]                                                                              Patient Demographics  Ralph [name], is a 54 y.o. male, DOB - Sep 26, 1965, ZOX:[id number]  Admit date - 05/27/2019   Admitting Physician Glade Lloyd, MD  Outpatient Primary MD for the patient is [name], [name], MD  Outpatient specialists:   LOS - 3  days   Medical records reviewed and are as summarized below:    Chief complaint: Worsening dysphagia      Brief summary   54 year old with history of unspecified CVA, CAD, HTN, HLD diagnosed with severe ulcerative esophagitis about a month ago placed on PPI. Continue to have worsening of dysphagia to solids and liquids. He has lost about 30 pounds in the last several months. GI performed repeat endoscopy is still showing severe esophagitis. Admitted for nutritional support with PICC line and TPN   Assessment & Plan    Principal Problem: Dysphagia to solids and liquids, moderate to severe esophagitis, weight loss Distal esophageal stricture -Previous EGD in 04/2019 had shown severe ulcerative esophagitis, Barrett's esophagus -EGD on 4/21 on admission showed severe esophagitis status post biopsy, distal stricture -Biopsies 4/21 positive for Candida, negative for dysplasia or malignancy -Started on IV PPI, Diflucan, TPN -Plan for repeat EGD with dilation on 4/26  Severe protein calorie malnutrition, hypoglycemia -CBG in 50s today, placed on D5 maintenance drip  Essential hypertension BP currently stable, oral Norvasc on hold -Placed on hydralazine IV as needed with parameters  Hyperlipidemia -Lipitor currently on hold  History of nicotine use -Counseled on tobacco cessation  History of alcohol use -Counseled on abstinence, continue CIWA protocol with Ativan   Severe protein calorie malnutrition Estimated body mass index is 20.53 kg/m as calculated from the following:   Height as of this encounter:   (1.727 m).   Weight as of this encounter: 61.2 kg.  Code Status: Full DVT Prophylaxis: Lovenox Family Communication: Discussed all imaging results, lab results, explained to the patient   Disposition Plan:     Status is: Inpatient  Remains inpatient appropriate because:IV treatments appropriate due to intensity of illness or inability to take PO   Dispo: The patient is from: Home              Anticipated d/c is to: Home              Anticipated d/c date is: > 3 days              Patient currently is not medically stable to d/c.       Time Spent in minutes 35 minutes  Procedures:  EGD  Consultants:   Gastroenterology  Antimicrobials:   Anti-infectives (From admission, onward)   Start     Dose/Rate Route Frequency Ordered Stop   05/28/19 1000  fluconazole (DIFLUCAN) IVPB 200 mg     200 mg 100 mL/hr over 60 Minutes Intravenous Daily 05/27/19 1326     05/27/19 1400  fluconazole (DIFLUCAN) IVPB 400 mg     400 mg 100 mL/hr over 120 Minutes Intravenous  Once  05/27/19 1326 05/27/19 1616          Medications  Scheduled Meds: . Chlorhexidine Gluconate Cloth  6 each Topical Daily  . enoxaparin (LOVENOX) injection  40 mg Subcutaneous Q24H  . insulin aspart  0-9 Units Subcutaneous Q8H  . nicotine  21 mg Transdermal Daily  . pantoprazole (PROTONIX) IV  40 mg Intravenous Q12H  . sodium chloride flush  10-40 mL Intracatheter Q12H   Continuous Infusions: . dextrose    . fluconazole (DIFLUCAN) IV 200 mg (05/30/19 1214)  . TPN ADULT (ION) 80 mL/hr at 05/29/19 1841  . TPN ADULT (ION)     PRN Meds:.acetaminophen **OR** acetaminophen, HYDROmorphone (DILAUDID) injection, LORazepam **OR** LORazepam, ondansetron **OR** ondansetron (ZOFRAN) IV, sodium chloride flush      Subjective:   Glover Capano was seen and examined today.  Very frail, somewhat sleepy and complaining of pain.  Patient denies dizziness, chest pain, shortness of breath, new weakness, numbess, tingling. No  acute events overnight.    Objective:   Vitals:   05/28/19 2058 05/29/19 0607 05/29/19 1309 05/29/19 2223  BP: (!) 162/102 118/80 127/88 (!) 157/112  Pulse: (!) 53 (!) 43 (!) 57 77  Resp: 15 16 16 18   Temp: 97.7 F (36.5 C) 98 F (36.7 C) 97.9 F (36.6 C) (!) 97.5 F (36.4 C)  TempSrc: Oral Oral Oral Oral  SpO2: 100% 100% 100% 98%  Weight:      Height:        Intake/Output Summary (Last 24 hours) at 05/30/2019 1317 Last data filed at 05/30/2019 1229 Gross per 24 hour  Intake --  Output 1300 ml  Net -1300 ml     Wt Readings from Last 3 Encounters:  05/27/19 61.2 kg  05/04/19 61.3 kg  04/29/19 60.8 kg     Exam  General: Somewhat sleepy but easily arousable and follows commands  Cardiovascular: S1 S2 auscultated, no murmurs, RRR  Respiratory: Clear to auscultation bilaterally, no wheezing, rales or rhonchi  Gastrointestinal: Soft, abdominal tenderness epigastric, LUQ  nondistended, + bowel sounds  Ext: no pedal edema bilaterally  Neuro: No acute FND's  Musculoskeletal: No digital cyanosis, clubbing  Skin: No rashes  Psych: Sleepy but arousable   Data Reviewed:  I have personally reviewed following labs and imaging studies  Micro Results Recent Results (from the past 240 hour(s))  SARS CORONAVIRUS 2 (TAT 6-24 HRS) Nasopharyngeal Nasopharyngeal Swab     Status: None   Collection Time: 05/23/19  2:04 PM   Specimen: Nasopharyngeal Swab  Result Value Ref Range Status   SARS Coronavirus 2 NEGATIVE NEGATIVE Final    Comment: (NOTE) SARS-CoV-2 target nucleic acids are NOT DETECTED. The SARS-CoV-2 RNA is generally detectable in upper and lower respiratory specimens during the acute phase of infection. Negative results do not preclude SARS-CoV-2 infection, do not rule out co-infections with other pathogens, and should not be used as the sole basis for treatment or other patient management decisions. Negative results must be combined with clinical  observations, patient history, and epidemiological information. The expected result is Negative. Fact Sheet for Patients: 05/25/19 Fact Sheet for Healthcare Providers: HairSlick.no This test is not yet approved or cleared by the quierodirigir.com FDA and  has been authorized for detection and/or diagnosis of SARS-CoV-2 by FDA under an Emergency Use Authorization (EUA). This EUA will remain  in effect (meaning this test can be used) for the duration of the COVID-19 declaration under Section 56 4(b)(1) of the Act, 21 U.S.C. section 360bbb-3(b)(1), unless  the authorization is terminated or revoked sooner. Performed at [facility], [address] [place], [place] [address]     Radiology Reports DG Abd 1 View  Result Date: 05/27/2019 CLINICAL DATA:  Recent endoscopy, abdominal pain EXAM: ABDOMEN - 1 VIEW COMPARISON:  05/01/2019 FINDINGS: Two supine frontal views of the abdomen and pelvis are obtained. The lung bases are clear. Bowel gas pattern is unremarkable without obstruction or ileus. There are no masses or abnormal calcifications. Retained metallic BB right lower quadrant unchanged. No acute bony abnormalities. Extensive atherosclerosis. IMPRESSION: 1. Unremarkable bowel gas pattern. Electronically Signed   By: Sharlet Salina M.D.   On: 05/27/2019 19:48   CT CHEST W CONTRAST  Result Date: 05/01/2019 CLINICAL DATA:  Esophageal mass. Erosive esophagitis. EXAM: CT CHEST, ABDOMEN, AND PELVIS WITH CONTRAST TECHNIQUE: Multidetector CT imaging of the chest, abdomen and pelvis was performed following the standard protocol during bolus administration of intravenous contrast. CONTRAST:  OMNIPAQUE IOHEXOL 300 MG/ML  SOLN COMPARISON:  10/08/2006 FINDINGS: CT CHEST FINDINGS Cardiovascular: Aorta is of normal caliber with minimal atherosclerosis. Mainly noncalcified plaque within the thoracic aorta. Central pulmonary vessels are  unremarkable. Heart size is normal without pericardial effusion. Calcified coronary artery disease. Mediastinum/Nodes: Thickening of the distal esophagus and gastroesophageal junction. Discrete mass is not visualized. Thickening extends from the mid through the distal esophagus. There is periesophageal stranding No hilar adenopathy. Mildly enlarged AP window lymph node with rounded morphology (image 22, series 3) 9 mm. Small lymph nodes track towards the thoracic inlet all less than a cm on the right along the paratracheal chain. No retrocrural adenopathy, axillary lymphadenopathy or thoracic inlet adenopathy though there is a small rounded node just below the thoracic inlet (image 9, series 3) Lungs/Pleura: Paraseptal and centrilobular emphysematous changes, moderate to marked with paraseptal predominance and with changes that are worse towards the lung apices basilar atelectasis. No consolidation or pleural effusion. Airways are patent. No suspicious mass or nodule. Musculoskeletal: No chest wall mass. No acute bone finding or destructive bone process in the thorax. CT ABDOMEN PELVIS FINDINGS Hepatobiliary: No focal, suspicious hepatic lesion. Mild biliary ductal dilation of extrahepatic biliary tree. No pericholecystic stranding. Pancreas: No pancreatic ductal dilation, mass or inflammation. Spleen: Spleen is normal size without focal lesion. Adrenals/Urinary Tract: Adrenal glands are normal. Kidneys enhance symmetrically without focal lesion. No hydronephrosis. Urinary bladder is normal. Stomach/Bowel: Thickening of the gastric cardia. Paucity of intra-abdominal mesenteric fat limiting assessment of individual bowel loops. No acute bowel process. Stool throughout much of the colon. Vascular/Lymphatic: Atheromatous plaque in the abdominal aorta. Plaque is both calcified and noncalcified and without sign of aneurysm. No aneurysm. Small lymph node in the hepatic gastric ligament (image 58, series 3) 1 cm.  Reproductive: No pelvic adenopathy. Prostate with some heterogeneity. No pelvic adenopathy. Other: No abdominal wall hernia or abnormality. No abdominopelvic ascites. Musculoskeletal: No acute bone finding or destructive bone process. Spinal degenerative changes. IMPRESSION: 1. Marked irregular thickening of the distal esophagus and gastroesophageal junction with periesophageal stranding. Discrete mass is not visualized. Mildly enlarged but rounded lymph nodes in the mediastinum in the current setting or suspicious. There also numerous small paraesophageal lymph nodes along the left lateral margin of the esophagus that display abnormal enhancement though are only 3-4 mm in size, not mentioned above. 2. Mediastinal lymph nodes though small are suspicious given esophageal findings particularly given the enlargement of a hepatic gastric lymph node below the diaphragm. PET scan may be helpful for further evaluation.  3. Moderate to marked paraseptal and centrilobular emphysematous changes of the lungs. 4. Atheromatous plaque in the thoracic aorta and coronary arteries. Aortic Atherosclerosis (ICD10-I70.0) and Emphysema (ICD10-J43.9). Electronically Signed   By: Zetta Bills M.D.   On: 05/01/2019 09:14   CT ABDOMEN PELVIS W CONTRAST  Result Date: 05/01/2019 CLINICAL DATA:  Esophageal mass. Erosive esophagitis. EXAM: CT CHEST, ABDOMEN, AND PELVIS WITH CONTRAST TECHNIQUE: Multidetector CT imaging of the chest, abdomen and pelvis was performed following the standard protocol during bolus administration of intravenous contrast. CONTRAST:  126mL OMNIPAQUE IOHEXOL 300 MG/ML  SOLN COMPARISON:  10/08/2006 FINDINGS: CT CHEST FINDINGS Cardiovascular: Aorta is of normal caliber with minimal atherosclerosis. Mainly noncalcified plaque within the thoracic aorta. Central pulmonary vessels are unremarkable. Heart size is normal without pericardial effusion. Calcified coronary artery disease. Mediastinum/Nodes: Thickening of the  distal esophagus and gastroesophageal junction. Discrete mass is not visualized. Thickening extends from the mid through the distal esophagus. There is periesophageal stranding No hilar adenopathy. Mildly enlarged AP window lymph node with rounded morphology (image 22, series 3) 9 mm. Small lymph nodes track towards the thoracic inlet all less than a cm on the right along the paratracheal chain. No retrocrural adenopathy, axillary lymphadenopathy or thoracic inlet adenopathy though there is a small rounded node just below the thoracic inlet (image 9, series 3) Lungs/Pleura: Paraseptal and centrilobular emphysematous changes, moderate to marked with paraseptal predominance and with changes that are worse towards the lung apices basilar atelectasis. No consolidation or pleural effusion. Airways are patent. No suspicious mass or nodule. Musculoskeletal: No chest wall mass. No acute bone finding or destructive bone process in the thorax. CT ABDOMEN PELVIS FINDINGS Hepatobiliary: No focal, suspicious hepatic lesion. Mild biliary ductal dilation of extrahepatic biliary tree. No pericholecystic stranding. Pancreas: No pancreatic ductal dilation, mass or inflammation. Spleen: Spleen is normal size without focal lesion. Adrenals/Urinary Tract: Adrenal glands are normal. Kidneys enhance symmetrically without focal lesion. No hydronephrosis. Urinary bladder is normal. Stomach/Bowel: Thickening of the gastric cardia. Paucity of intra-abdominal mesenteric fat limiting assessment of individual bowel loops. No acute bowel process. Stool throughout much of the colon. Vascular/Lymphatic: Atheromatous plaque in the abdominal aorta. Plaque is both calcified and noncalcified and without sign of aneurysm. No aneurysm. Small lymph node in the hepatic gastric ligament (image 58, series 3) 1 cm. Reproductive: No pelvic adenopathy. Prostate with some heterogeneity. No pelvic adenopathy. Other: No abdominal wall hernia or abnormality. No  abdominopelvic ascites. Musculoskeletal: No acute bone finding or destructive bone process. Spinal degenerative changes. IMPRESSION: 1. Marked irregular thickening of the distal esophagus and gastroesophageal junction with periesophageal stranding. Discrete mass is not visualized. Mildly enlarged but rounded lymph nodes in the mediastinum in the current setting or suspicious. There also numerous small paraesophageal lymph nodes along the left lateral margin of the esophagus that display abnormal enhancement though are only 3-4 mm in size, not mentioned above. 2. Mediastinal lymph nodes though small are suspicious given esophageal findings particularly given the enlargement of a hepatic gastric lymph node below the diaphragm. PET scan may be helpful for further evaluation. 3. Moderate to marked paraseptal and centrilobular emphysematous changes of the lungs. 4. Atheromatous plaque in the thoracic aorta and coronary arteries. Aortic Atherosclerosis (ICD10-I70.0) and Emphysema (ICD10-J43.9). Electronically Signed   By: Zetta Bills M.D.   On: 05/01/2019 09:14   ECHOCARDIOGRAM COMPLETE  Result Date: 04/30/2019    ECHOCARDIOGRAM REPORT   Patient Name:   DAIN LASETER Date of Exam: 04/30/2019 Medical Rec #:  [id number]    Height:       69.0 in Accession #:    [id number]   Weight:       134.0 lb Date of Birth:  11/07/1965    BSA:          1.743 m Patient Age:    53 years     BP:           165/96 mmHg Patient Gender: M            HR:           44 bpm. Exam Location:  Inpatient Procedure: 2D Echo, Color Doppler and Cardiac Doppler Indications:    R94.31 Abnormal EKG  History:        Patient has no prior history of Echocardiogram examinations.                 CAD; Risk Factors:Hypertension and Dyslipidemia.  Sonographer:    Irving BurtonEmily Senior RDCS Referring Phys: [phone] CARINA M BROWN IMPRESSIONS  1. Normal LV systolic function; grade 1 diastolic dysfunction.  2. Left ventricular ejection fraction, by estimation, is 60 to 65%.  The left ventricle has normal function. The left ventricle has no regional wall motion abnormalities. Left ventricular diastolic parameters are consistent with Grade I diastolic dysfunction (impaired relaxation).  3. Right ventricular systolic function is normal. The right ventricular size is normal.  4. The mitral valve is normal in structure. No evidence of mitral valve regurgitation. No evidence of mitral stenosis.  5. The aortic valve was not well visualized. Aortic valve regurgitation is not visualized. No aortic stenosis is present.  6. The inferior vena cava is normal in size with greater than 50% respiratory variability, suggesting right atrial pressure of 3 mmHg. FINDINGS  Left Ventricle: Left ventricular ejection fraction, by estimation, is 60 to 65%. The left ventricle has normal function. The left ventricle has no regional wall motion abnormalities. The left ventricular internal cavity size was normal in size. There is  no left ventricular hypertrophy. Left ventricular diastolic parameters are consistent with Grade I diastolic dysfunction (impaired relaxation). Right Ventricle: The right ventricular size is normal. Right ventricular systolic function is normal. Left Atrium: Left atrial size was normal in size. Right Atrium: Right atrial size was normal in size. Pericardium: There is no evidence of pericardial effusion. Mitral Valve: The mitral valve is normal in structure. Normal mobility of the mitral valve leaflets. No evidence of mitral valve regurgitation. No evidence of mitral valve stenosis. Tricuspid Valve: The tricuspid valve is normal in structure. Tricuspid valve regurgitation is not demonstrated. No evidence of tricuspid stenosis. Aortic Valve: The aortic valve was not well visualized. Aortic valve regurgitation is not visualized. No aortic stenosis is present. Pulmonic Valve: The pulmonic valve was not well visualized. Pulmonic valve regurgitation is not visualized. No evidence of pulmonic  stenosis. Aorta: The aortic root is normal in size and structure. Venous: The inferior vena cava is normal in size with greater than 50% respiratory variability, suggesting right atrial pressure of 3 mmHg.  Additional Comments: Normal LV systolic function; grade 1 diastolic dysfunction.  LEFT VENTRICLE PLAX 2D LVIDd:         3.50 cm  Diastology LVIDs:         2.40 cm  LV e' lateral:   9.86 cm/s LV PW:         0.90 cm  LV E/e' lateral: 4.9 LV IVS:        1.00 cm  LV e'  medial:    7.42 cm/s LVOT diam:     2.00 cm  LV E/e' medial:  6.6 LV SV:         95 LV SV Index:   54 LVOT Area:     3.14 cm  RIGHT VENTRICLE RV S prime:     14.60 cm/s TAPSE (M-mode): 2.1 cm LEFT ATRIUM             Index       RIGHT ATRIUM           Index LA diam:        2.90 cm 1.66 cm/m  RA Area:     13.20 cm LA Vol (A2C):   40.7 ml 23.35 ml/m RA Volume:   31.80 ml  18.25 ml/m LA Vol (A4C):   40.8 ml 23.41 ml/m LA Biplane Vol: 43.5 ml 24.96 ml/m  AORTIC VALVE LVOT Vmax:   113.00 cm/s LVOT Vmean:  76.600 cm/s LVOT VTI:    0.301 m  AORTA Ao Root diam: 3.50 cm Ao Asc diam:  3.70 cm MITRAL VALVE MV Area (PHT): 1.71 cm    SHUNTS MV Decel Time: 443 msec    Systemic VTI:  0.30 m MV E velocity: 48.70 cm/s  Systemic Diam: 2.00 cm MV A velocity: 62.20 cm/s MV E/A ratio:  0.78 Olga Millers MD Electronically signed by Olga Millers MD Signature Date/Time: 04/30/2019/3:17:56 PM    Final    [place] EKG SITE RITE  Result Date: 05/27/2019 If Site Rite image not attached, placement could not be confirmed due to current cardiac rhythm.   Lab Data:  CBC: Recent Labs  Lab 05/27/19 1309 05/28/19 0610 05/30/19 0725  WBC 10.1 7.4 6.2  NEUTROABS 6.6 5.0 3.3  HGB 16.7 12.6* 15.7  HCT 50.0 38.3* 47.3  MCV 94.2 97.2 97.1  PLT 226 158 201   Basic Metabolic Panel: Recent Labs  Lab 05/27/19 1309 05/28/19 0840 05/29/19 0454 05/30/19 0725  NA 135 136 141 137  K 4.6 4.0 3.8 4.4  CL 97* 106 106 106  CO2 27 25 28 26   GLUCOSE 92 117* 118* 107*   BUN 8 8 6 10   CREATININE 0.83 0.71 0.72 0.77  CALCIUM 9.7 8.7* 9.0 8.9  MG  --  1.9 2.0 2.2  PHOS  --  2.7 3.3 4.0   GFR: Estimated Creatinine Clearance: 94.6 mL/min (by C-G formula based on SCr of 0.77 mg/dL). Liver Function Tests: Recent Labs  Lab 05/27/19 1309 05/28/19 0840 05/29/19 0454 05/30/19 0725  AST 18 15 15 17   ALT 13 11 11 11   ALKPHOS 86 72 66 67  BILITOT 0.9 0.7 0.5 0.3  PROT 7.3 6.0* 5.7* 6.4*  ALBUMIN 4.1 3.3* 3.3* 3.4*   No results for input(s): LIPASE, AMYLASE in the last 168 hours. No results for input(s): AMMONIA in the last 168 hours. Coagulation Profile: No results for input(s): INR, PROTIME in the last 168 hours. Cardiac Enzymes: No results for input(s): CKTOTAL, CKMB, CKMBINDEX, TROPONINI in the last 168 hours. BNP (last 3 results) No results for input(s): PROBNP in the last 8760 hours. HbA1C: No results for input(s): HGBA1C in the last 72 hours. CBG: Recent Labs  Lab 05/29/19 1757 05/29/19 2003 05/30/19 0123 05/30/19 1144 05/30/19 1226  GLUCAP 87 75 121* 59* 116*   Lipid Profile: Recent Labs    05/28/19 0610 05/28/19 0840  TRIG SPECIMEN CONTAMINATED, UNABLE TO PERFORM TEST(S). 102   Thyroid Function Tests: No results for input(s): TSH, T4TOTAL,  FREET4, T3FREE, THYROIDAB in the last 72 hours. Anemia Panel: No results for input(s): VITAMINB12, FOLATE, FERRITIN, TIBC, IRON, RETICCTPCT in the last 72 hours. Urine analysis:    Component Value Date/Time   COLORURINE YELLOW 10/08/2006 1437   APPEARANCEUR CLEAR 10/08/2006 1437   LABSPEC 1.006 10/08/2006 1437   PHURINE 5.5 10/08/2006 1437   GLUCOSEU NEGATIVE 10/08/2006 1437   HGBUR NEGATIVE 10/08/2006 1437   BILIRUBINUR NEGATIVE 10/08/2006 1437   KETONESUR NEGATIVE 10/08/2006 1437   PROTEINUR NEGATIVE 10/08/2006 1437   UROBILINOGEN 0.2 10/08/2006 1437   NITRITE NEGATIVE 10/08/2006 1437   LEUKOCYTESUR  10/08/2006 1437    NEGATIVE MICROSCOPIC NOT DONE ON URINES WITH NEGATIVE PROTEIN,  BLOOD, LEUKOCYTES, NITRITE, OR GLUCOSE <1000 mg/dL.     Thad Ranger M.D. [facility] 05/30/2019, 1:17 PM   Call night coverage person covering after 7pm

## 2019-05-31 LAB — GLUCOSE, CAPILLARY
Glucose-Capillary: 125 mg/dL — ABNORMAL HIGH (ref 70–99)
Glucose-Capillary: 53 mg/dL — ABNORMAL LOW (ref 70–99)
Glucose-Capillary: 81 mg/dL (ref 70–99)
Glucose-Capillary: 82 mg/dL (ref 70–99)
Glucose-Capillary: 87 mg/dL (ref 70–99)
Glucose-Capillary: 88 mg/dL (ref 70–99)
Glucose-Capillary: 97 mg/dL (ref 70–99)

## 2019-05-31 LAB — COMPREHENSIVE METABOLIC PANEL
ALT: 8 U/L (ref 0–44)
AST: 23 U/L (ref 15–41)
Albumin: 4.2 g/dL (ref 3.5–5.0)
Alkaline Phosphatase: 79 U/L (ref 38–126)
Anion gap: 9 (ref 5–15)
BUN: 15 mg/dL (ref 6–20)
CO2: 26 mmol/L (ref 22–32)
Calcium: 9.8 mg/dL (ref 8.9–10.3)
Chloride: 103 mmol/L (ref 98–111)
Creatinine, Ser: 0.72 mg/dL (ref 0.61–1.24)
GFR calc Af Amer: >60 mL/min (ref >60)
GFR calc non Af Amer: >60 mL/min (ref >60)
Glucose, Bld: 75 mg/dL (ref 70–99)
Potassium: 4.5 mmol/L (ref 3.5–5.1)
Sodium: 138 mmol/L (ref 135–145)
Total Bilirubin: 0.6 mg/dL (ref 0.3–1.2)
Total Protein: 7.8 g/dL (ref 6.5–8.1)

## 2019-05-31 LAB — MAGNESIUM: Magnesium: 2.2 mg/dL (ref 1.7–2.4)

## 2019-05-31 MED ORDER — DEXTROSE 50 % IV SOLN
INTRAVENOUS | Status: AC
  Filled 2019-05-31: qty 50

## 2019-05-31 MED ORDER — LORAZEPAM 2 MG/ML IJ SOLN
1.0000 mg | INTRAMUSCULAR | Status: DC | PRN
  Administered 2019-05-31 – 2019-06-01 (×3): 1 mg via INTRAVENOUS
  Administered 2019-06-01: 2 mg via INTRAVENOUS
  Administered 2019-06-02 (×2): 1 mg via INTRAVENOUS
  Filled 2019-05-31 (×6): qty 1

## 2019-05-31 MED ORDER — LORAZEPAM 1 MG PO TABS: 1.0000 mg | ORAL_TABLET | ORAL | Status: DC | PRN

## 2019-05-31 MED ORDER — TRAVASOL 10 % IV SOLN
INTRAVENOUS | Status: AC
  Filled 2019-05-31: qty 960

## 2019-05-31 MED ORDER — HYDRALAZINE HCL 20 MG/ML IJ SOLN
5.0000 mg | Freq: Three times a day (TID) | INTRAMUSCULAR | Status: DC
  Administered 2019-05-31 – 2019-06-01 (×3): 5 mg via INTRAVENOUS
  Filled 2019-05-31 (×3): qty 1

## 2019-05-31 MED ORDER — DEXTROSE 50 % IV SOLN
25.0000 mL | Freq: Once | INTRAVENOUS | Status: AC
  Administered 2019-05-31: 25 mL via INTRAVENOUS

## 2019-05-31 MED ORDER — INSULIN ASPART 100 UNIT/ML ~~LOC~~ SOLN: 0.0000 [IU] | SUBCUTANEOUS | Status: DC

## 2019-05-31 NOTE — Progress Notes (Signed)
Hypoglycemic Event  CBG: 53 Treatment: D50 25 mL (12.5 gm)  Symptoms: Shaky  Follow-up CBG: Time 1210 CBG Result:125  Possible Reasons for Event: Unknown  Comments/MD notified Dr Isidoro Donning 1155, new orders given   Ralph [name]

## 2019-05-31 NOTE — Progress Notes (Signed)
PHARMACY - TOTAL PARENTERAL NUTRITION CONSULT NOTE   Indication: prolonged malnutrition with significant weight loss, intolerance to enteral feeding related to dysphagia  Patient Measurements: Height: 5\' 8"  (172.7 cm) Weight: 61.2 kg (135 lb) IBW/kg (Calculated) : 68.4 TPN AdjBW (KG): 61.2 Body mass index is 20.53 kg/Ralph [name]. Usual Weight: 77 kg  Assessment:  54 y/[name] hospitalized in March for worsening dysphagia was found to have severe ulcerative esophagits, Barrett's esophagus. Patient reports very hard time swallowing solid foods and liquids with reported weight loss of 30 pounds. Pharmacy consulted to initiate TPN.   Glucose / Insulin: CBGs 72-87 in past 24hrs.  No insulin administered.  Dextrose at 17.5% in TPN and on D5W infusion increased to 51ml/hr.  Also received 25gm IV D50 bolus x2.   Electrolytes: WNL Renal: SCr stable LFTs / TGs: WNL Prealbumin / albumin: 14.5 (4/22)/3.3  Intake / Output; MIVF: net -1.6L GI Imaging: 4/21 EGD with moderate to severe esophagitis without bleeding Surgeries / Procedures: EGD dilation planned for 4/26  Central access: 4/21 TPN start date: 4/22  Nutritional Goals (per RD recommendation on 4/21): kCal: 1800-2025, Protein: 90-110, Fluid: >/= 1.8 L/day Goal TPN rate is 80 mL/hr (provides 96 g of protein and 2016 kcals per day) with 20% dextrose and 17 g/L lipids  Current Nutrition:  TPN at goal rate  Plan:  At 1800: Continue TPN at goal rate 71mL/hr.  Increase Dextrose concentration to 20%.  This is at MAX glucose infusion rate when combined with peripherally infusing D5W at 72ml/hr.   Electrolytes in TPN: 53mEq/L of Na, 36mEq/L of K, 25mEq/L of Ca, 67mEq/L of Mg, and 46mmol/L of Phos. Cl:Ac ratio 1:1 Add standard MVI, trace elements, folic acid, and thiamine to TPN daily Sensitive SSI & CBG monitoring q8h D5W @ 40 mL/hr per MD.   Monitor TPN labs on Mon/Thurs  12m PharmD, BCPS 05/31/2019,8:20 AM

## 2019-05-31 NOTE — Progress Notes (Signed)
[facility]                                                                              Patient Demographics  Ralph [name], is a 54 y.o. male, DOB - 1965-05-04, GNF:[id number]  Admit date - 05/27/2019   Admitting Physician Glade Lloyd, MD  Outpatient Primary MD for the patient is [name], [name], MD  Outpatient specialists:   LOS - 4  days   Medical records reviewed and are as summarized below:    Chief complaint: Worsening dysphagia      Brief summary   54 year old with history of unspecified CVA, CAD, HTN, HLD diagnosed with severe ulcerative esophagitis about a month ago placed on PPI. Continue to have worsening of dysphagia to solids and liquids. He has lost about 30 pounds in the last several months. GI performed repeat endoscopy is still showing severe esophagitis. Admitted for nutritional support with PICC line and TPN   Assessment & Plan    Principal Problem: Dysphagia to solids and liquids, moderate to severe esophagitis, weight loss Distal esophageal stricture -Previous EGD in 04/2019 had shown severe ulcerative esophagitis, Barrett's esophagus -EGD on 4/21 on admission showed severe esophagitis status post biopsy, distal stricture -Biopsies 4/21 positive for Candida, negative for dysplasia or malignancy -Started on IV PPI, Diflucan, TPN -Plan for repeat endoscopy with dilation on 4/26  Severe protein calorie malnutrition, hypoglycemia -Patient continues to have hypoglycemia, CBGs in 50s -Continue TPN, increase to D5 drip to 75 cc an hour  Essential hypertension BP elevated, n.p.o., placed on scheduled IV hydralazine 5 mg every 8 hours -Continue IV hydralazine 10 mg every 6 hours as needed with parameters  Hyperlipidemia -Lipitor currently on hold  History of nicotine use -Counseled on tobacco cessation  History of alcohol use -Continue CIWA protocol with Ativan, no acute withdrawals   Severe protein calorie malnutrition  Estimated body mass index is 20.53 kg/m as calculated from the following:   Height as of this encounter: 5\' 8"  (1.727 m).   Weight as of this encounter: 61.2 kg.  Code Status: Full DVT Prophylaxis: Lovenox Family Communication: Discussed all imaging results, lab results, explained to the patient   Disposition Plan:     Status is: Inpatient  Remains inpatient appropriate because:IV treatments appropriate due to intensity of illness or inability to take PO   Dispo: The patient is from: Home              Anticipated d/c is to: Home              Anticipated d/c date is: > 3 days              Patient currently is not medically stable to d/c.       Time Spent in minutes 35 minutes  Procedures:  EGD  Consultants:   Gastroenterology  Antimicrobials:   Anti-infectives (From admission, onward)   Start     Dose/Rate Route Frequency Ordered Stop   05/28/19 1000  fluconazole (DIFLUCAN) IVPB 200 mg     200 mg 100 mL/hr over 60 Minutes Intravenous Daily 05/27/19 1326     05/27/19 1400  fluconazole (DIFLUCAN) IVPB 400 mg     400 mg 100 mL/hr over 120 Minutes Intravenous  Once 05/27/19 1326 05/27/19 1616         Medications  Scheduled Meds: . Chlorhexidine Gluconate Cloth  6 each Topical Daily  . enoxaparin (LOVENOX) injection  40 mg Subcutaneous Q24H  . insulin aspart  0-9 Units Subcutaneous Q8H  . nicotine  21 mg Transdermal Daily  . pantoprazole (PROTONIX) IV  40 mg Intravenous Q12H  . sodium chloride flush  10-40 mL Intracatheter Q12H   Continuous Infusions: . dextrose 40 mL/hr at 05/30/19 1551  . fluconazole (DIFLUCAN) IV 200 mg (05/31/19 0946)  . TPN ADULT (ION) 80 mL/hr at 05/30/19 1724  . TPN ADULT (ION)     PRN Meds:.acetaminophen **OR** acetaminophen, hydrALAZINE, HYDROmorphone (DILAUDID) injection, ondansetron **OR** ondansetron (ZOFRAN) IV, sodium chloride flush      Subjective:   [name] was seen and examined today.  Frail, getting hypoglycemic  due to lack of diet.  BP elevated.  Sitting up in the bed, no acute complaints, awaiting endoscopy in a.m.  Patient denies dizziness, chest pain, shortness of breath, new weakness, numbess, tingling. No acute events overnight.    Objective:   Vitals:   05/29/19 1309 05/29/19 2223 05/30/19 2022 05/31/19 1402  BP: 127/88 (!) 157/112 (!) 162/103 (!) 152/96  Pulse: (!) 57 77 60 (!) 55  Resp: 16 18 19 16   Temp: 97.9 F (36.6 C) (!) 97.5 F (36.4 C) (!) 97.4 F (36.3 C)   TempSrc: Oral Oral    SpO2: 100% 98% 97% 97%  Weight:      Height:        Intake/Output Summary (Last 24 hours) at 05/31/2019 1410 Last data filed at 05/31/2019 0530 Gross per 24 hour  Intake -  Output 1475 ml  Net -1475 ml     Wt Readings from Last 3 Encounters:  05/27/19 61.2 kg  05/04/19 61.3 kg  04/29/19 60.8 kg    Physical Exam  General: Alert and oriented x 3, NAD, ill-appearing  Cardiovascular: S1 S2 clear, RRR. No pedal edema b/l  Respiratory: CTAB, no wheezing, rales or rhonchi  Gastrointestinal: Soft, mild epigastric, LUQ tenderness  nondistended, NBS  Ext: no pedal edema bilaterally  Neuro: no new deficits  Musculoskeletal: No cyanosis, clubbing  Skin: No rashes  Psych: Normal affect and demeanor, alert and oriented x3     Data Reviewed:  I have personally reviewed following labs and imaging studies  Micro Results Recent Results (from the past 240 hour(s))  SARS CORONAVIRUS 2 (TAT 6-24 HRS) Nasopharyngeal Nasopharyngeal Swab     Status: None   Collection Time: 05/23/19  2:04 PM   Specimen: Nasopharyngeal Swab  Result Value Ref Range Status   SARS Coronavirus 2 NEGATIVE NEGATIVE Final    Comment: (NOTE) SARS-CoV-2 target nucleic acids are NOT DETECTED. The SARS-CoV-2 RNA is generally detectable in upper and lower respiratory specimens during the acute phase of infection. Negative results do not preclude SARS-CoV-2 infection, do not rule out co-infections with other pathogens,  and should not be used as the sole basis for treatment or other patient management decisions. Negative results must be combined with clinical observations, patient history, and epidemiological information. The expected result is Negative. Fact Sheet for Patients: 05/25/19 Fact Sheet for Healthcare Providers: HairSlick.no This test is not yet approved or cleared by the quierodirigir.com FDA and  has been authorized for detection and/or diagnosis of SARS-CoV-2 by FDA under an Emergency  Use Authorization (EUA). This EUA will remain  in effect (meaning this test can be used) for the duration of the COVID-19 declaration under Section 56 4(b)(1) of the Act, 21 U.S.C. section 360bbb-3(b)(1), unless the authorization is terminated or revoked sooner. Performed at [facility], [address] [place], [place] [address]     Radiology Reports DG Abd 1 View  Result Date: 05/27/2019 CLINICAL DATA:  Recent endoscopy, abdominal pain EXAM: ABDOMEN - 1 VIEW COMPARISON:  05/01/2019 FINDINGS: Two supine frontal views of the abdomen and pelvis are obtained. The lung bases are clear. Bowel gas pattern is unremarkable without obstruction or ileus. There are no masses or abnormal calcifications. Retained metallic BB right lower quadrant unchanged. No acute bony abnormalities. Extensive atherosclerosis. IMPRESSION: 1. Unremarkable bowel gas pattern. Electronically Signed   By: Sharlet Salina M.D.   On: 05/27/2019 19:48   [place] EKG SITE RITE  Result Date: 05/27/2019 If Site Rite image not attached, placement could not be confirmed due to current cardiac rhythm.   Lab Data:  CBC: Recent Labs  Lab 05/27/19 1309 05/28/19 0610 05/30/19 0725  WBC 10.1 7.4 6.2  NEUTROABS 6.6 5.0 3.3  HGB 16.7 12.6* 15.7  HCT 50.0 38.3* 47.3  MCV 94.2 97.2 97.1  PLT 226 158 201   Basic Metabolic Panel: Recent Labs  Lab 05/27/19 1309 05/28/19 0840 05/29/19  0454 05/30/19 0725 05/31/19 0600  NA 135 136 141 137 138  K 4.6 4.0 3.8 4.4 4.5  CL 97* 106 106 106 103  CO2 27 25 28 26 26   GLUCOSE 92 117* 118* 107* 75  BUN 8 8 6 10 15   CREATININE 0.83 0.71 0.72 0.77 0.72  CALCIUM 9.7 8.7* 9.0 8.9 9.8  MG  --  1.9 2.0 2.2 2.2  PHOS  --  2.7 3.3 4.0  --    GFR: Estimated Creatinine Clearance: 94.6 mL/min (by C-G formula based on SCr of 0.72 mg/dL). Liver Function Tests: Recent Labs  Lab 05/27/19 1309 05/28/19 0840 05/29/19 0454 05/30/19 0725 05/31/19 0600  AST 18 15 15 17 23   ALT 13 11 11 11 8   ALKPHOS 86 72 66 67 79  BILITOT 0.9 0.7 0.5 0.3 0.6  PROT 7.3 6.0* 5.7* 6.4* 7.8  ALBUMIN 4.1 3.3* 3.3* 3.4* 4.2   No results for input(s): LIPASE, AMYLASE in the last 168 hours. No results for input(s): AMMONIA in the last 168 hours. Coagulation Profile: No results for input(s): INR, PROTIME in the last 168 hours. Cardiac Enzymes: No results for input(s): CKTOTAL, CKMB, CKMBINDEX, TROPONINI in the last 168 hours. BNP (last 3 results) No results for input(s): PROBNP in the last 8760 hours. HbA1C: No results for input(s): HGBA1C in the last 72 hours. CBG: Recent Labs  Lab 05/30/19 1609 05/31/19 0013 05/31/19 0753 05/31/19 1142 05/31/19 1208  GLUCAP 72 81 87 53* 125*   Lipid Profile: No results for input(s): CHOL, HDL, LDLCALC, TRIG, CHOLHDL, LDLDIRECT in the last 72 hours. Thyroid Function Tests: No results for input(s): TSH, T4TOTAL, FREET4, T3FREE, THYROIDAB in the last 72 hours. Anemia Panel: No results for input(s): VITAMINB12, FOLATE, FERRITIN, TIBC, IRON, RETICCTPCT in the last 72 hours. Urine analysis:    Component Value Date/Time   COLORURINE YELLOW 10/08/2006 1437   APPEARANCEUR CLEAR 10/08/2006 1437   LABSPEC 1.006 10/08/2006 1437   PHURINE 5.5 10/08/2006 1437   GLUCOSEU NEGATIVE 10/08/2006 1437   HGBUR NEGATIVE 10/08/2006 1437   BILIRUBINUR NEGATIVE 10/08/2006 1437   KETONESUR NEGATIVE 10/08/2006  [place] 10/08/2006 1437   UROBILINOGEN 0.2 10/08/2006 1437   NITRITE NEGATIVE 10/08/2006 1437   LEUKOCYTESUR  10/08/2006 1437    NEGATIVE MICROSCOPIC NOT DONE ON URINES WITH NEGATIVE PROTEIN, BLOOD, LEUKOCYTES, NITRITE, OR GLUCOSE <1000 mg/dL.     Estill Cotta M.D. [facility] 05/31/2019, 2:10 PM   Call night coverage person covering after 7pm

## 2019-06-01 ENCOUNTER — Inpatient Hospital Stay (HOSPITAL_COMMUNITY): Payer: Self-pay | Admitting: Anesthesiology

## 2019-06-01 ENCOUNTER — Encounter (HOSPITAL_COMMUNITY): Payer: Self-pay | Admitting: Internal Medicine

## 2019-06-01 ENCOUNTER — Encounter (HOSPITAL_COMMUNITY)
Admission: RE | Disposition: A | Discharge status: Home / Self Care | Payer: Self-pay | Source: Home / Self Care | Attending: Internal Medicine

## 2019-06-01 ENCOUNTER — Inpatient Hospital Stay (HOSPITAL_COMMUNITY): Payer: Self-pay

## 2019-06-01 HISTORY — PX: BALLOON DILATION: SHX5330

## 2019-06-01 HISTORY — PX: ESOPHAGOGASTRODUODENOSCOPY: SHX5428

## 2019-06-01 HISTORY — PX: BIOPSY: SHX5522

## 2019-06-01 LAB — DIFFERENTIAL
Abs Immature Granulocytes: 0.05 10*3/uL (ref 0.00–0.07)
Basophils Absolute: 0.1 10*3/uL (ref 0.0–0.1)
Basophils Relative: 1 %
Eosinophils Absolute: 0.8 10*3/uL — ABNORMAL HIGH (ref 0.0–0.5)
Eosinophils Relative: 8 %
Immature Granulocytes: 1 %
Lymphocytes Relative: 14 %
Lymphs Abs: 1.4 10*3/uL (ref 0.7–4.0)
Monocytes Absolute: 0.8 10*3/uL (ref 0.1–1.0)
Monocytes Relative: 8 %
Neutro Abs: 6.4 10*3/uL (ref 1.7–7.7)
Neutrophils Relative %: 68 %

## 2019-06-01 LAB — GLUCOSE, CAPILLARY
Glucose-Capillary: 84 mg/dL (ref 70–99)
Glucose-Capillary: 105 mg/dL — ABNORMAL HIGH (ref 70–99)
Glucose-Capillary: 93 mg/dL (ref 70–99)
Glucose-Capillary: 105 mg/dL — ABNORMAL HIGH (ref 70–99)
Glucose-Capillary: 84 mg/dL (ref 70–99)
Glucose-Capillary: 105 mg/dL — ABNORMAL HIGH (ref 70–99)

## 2019-06-01 LAB — CBC
HCT: 51.9 % (ref 39.0–52.0)
Hemoglobin: 17.2 g/dL — ABNORMAL HIGH (ref 13.0–17.0)
MCH: 31.9 pg (ref 26.0–34.0)
MCHC: 33.1 g/dL (ref 30.0–36.0)
MCV: 96.3 fL (ref 80.0–100.0)
Platelets: 208 10*3/uL (ref 150–400)
RBC: 5.39 MIL/uL (ref 4.22–5.81)
RDW: 13 % (ref 11.5–15.5)
WBC: 9.5 10*3/uL (ref 4.0–10.5)
nRBC: 0 % (ref 0.0–0.2)

## 2019-06-01 LAB — MAGNESIUM: Magnesium: 2 mg/dL (ref 1.7–2.4)

## 2019-06-01 LAB — PHOSPHORUS: Phosphorus: 4.6 mg/dL (ref 2.5–4.6)

## 2019-06-01 LAB — PREALBUMIN: Prealbumin: 24.9 mg/dL (ref 18–38)

## 2019-06-01 LAB — TRIGLYCERIDES: Triglycerides: 52 mg/dL (ref <150)

## 2019-06-01 SURGERY — EGD (ESOPHAGOGASTRODUODENOSCOPY)
Anesthesia: Monitor Anesthesia Care

## 2019-06-01 MED ORDER — PROPOFOL 500 MG/50ML IV EMUL
INTRAVENOUS | Status: DC | PRN
  Administered 2019-06-01: 100 ug/kg/min via INTRAVENOUS

## 2019-06-01 MED ORDER — LACTATED RINGERS IV SOLN: INTRAVENOUS | Status: DC

## 2019-06-01 MED ORDER — TRAVASOL 10 % IV SOLN
INTRAVENOUS | Status: DC
  Filled 2019-06-01: qty 960

## 2019-06-01 MED ORDER — EPHEDRINE SULFATE-NACL 50-0.9 MG/10ML-% IV SOSY
PREFILLED_SYRINGE | INTRAVENOUS | Status: DC | PRN
  Administered 2019-06-01: 5 mg via INTRAVENOUS

## 2019-06-01 MED ORDER — PROPOFOL 10 MG/ML IV BOLUS
INTRAVENOUS | Status: DC | PRN
  Administered 2019-06-01: 30 mg via INTRAVENOUS

## 2019-06-01 MED ORDER — SODIUM CHLORIDE 0.9 % IV SOLN: INTRAVENOUS | Status: DC

## 2019-06-01 NOTE — Transfer of Care (Signed)
Immediate Anesthesia Transfer of Care Note  Patient: Ralph [name]  Procedure(s) Performed: ESOPHAGOGASTRODUODENOSCOPY (EGD) (N/A ) BALLOON DILATION (N/A )  Patient Location: PACU  Anesthesia Type:MAC  Level of Consciousness: sedated, patient cooperative and responds to stimulation  Airway & Oxygen Therapy: Patient Spontanous Breathing and Patient connected to face mask oxygen  Post-op Assessment: Report given to RN and Post -op Vital signs reviewed and stable  Post vital signs: Reviewed and stable  Last Vitals:  Vitals Value Taken Time  BP 80/56 06/01/19 1521  Temp    Pulse 78 06/01/19 1523  Resp 17 06/01/19 1523  SpO2 100 % 06/01/19 1523  Vitals shown include unvalidated device data.  Last Pain:  Vitals:   06/01/19 1336  TempSrc: Oral  PainSc: 3       Patients Stated Pain Goal: 0 (63/89/37 3428)  Complications: No apparent anesthesia complications

## 2019-06-01 NOTE — Progress Notes (Signed)
Ralph [name] 2:15 PM  Subjective: Patient seen and examined in hospital computer chart reviewed including his last 2 endoscopies and case discussed with multiple of my partners and he has no new complaints  Objective: Vital signs stable afebrile no acute distress exam please see preassessment evaluation labs and CT reviewed  Assessment: Esophageal stricture questionable esophageal cancer  Plan: Okay to proceed with endoscopy dilation and possibly biopsy with anesthesia assistance and the risks were discussed with the patient  [facility]  office [phone] After 5PM or if no answer call [phone]

## 2019-06-01 NOTE — Progress Notes (Signed)
[facility]                                                                              Patient Demographics  Ralph [name], is a 54 y.o. male, DOB - [birth year], IHK:[id number]  Admit date - 05/27/2019   Admitting Physician Glade Lloyd, MD  Outpatient Primary MD for the patient is [name], [name], MD  Outpatient specialists:   LOS - 5  days   Medical records reviewed and are as summarized below:    Chief complaint: Worsening dysphagia      Brief summary   54 year old with history of unspecified CVA, CAD, HTN, HLD diagnosed with severe ulcerative esophagitis about a month ago placed on PPI. Continue to have worsening of dysphagia to solids and liquids. He has lost about 30 pounds in the last several months. GI performed repeat endoscopy is still showing severe esophagitis. Admitted for nutritional support with PICC line and TPN   Assessment & Plan    Principal Problem: Dysphagia to solids and liquids, moderate to severe esophagitis, weight loss Distal esophageal stricture -Previous EGD in 04/2019 had shown severe ulcerative esophagitis, Barrett's esophagus -EGD on 4/21 on admission showed severe esophagitis status post biopsy, distal stricture -Biopsies 4/21 positive for Candida, negative for dysplasia or malignancy -Has been n.p.o., started on IV PPI, Diflucan, TPN -Repeat endoscopy done today showed moderately severe reflux esophagitis, no bleeding, esophageal stenosis, dilated to 10 mm -GI recommended clear liquid diet, if doing well, advance to full liquid diet tomorrow. -Avoid aspirin, NSAIDs  Severe protein calorie malnutrition, hypoglycemia -Continue TPN, on D5 drip due to hypoglycemia episodes -Starting clear liquid diet  Essential hypertension -BP soft today, patient was placed on IV hydralazine scheduled for elevated BP yesterday -Continue IV hydralazine 10 mg every 6 hours as needed with parameters, will discontinue scheduled  hydralazine  Hyperlipidemia -Lipitor currently on hold  History of nicotine use -Counseled on tobacco cessation  History of alcohol use -Continue CIWA protocol with Ativan, no acute withdrawals   Severe protein calorie malnutrition Estimated body mass index is 20.45 kg/m as calculated from the following:   Height as of this encounter: 5\' 8"  (1.727 m).   Weight as of this encounter: 61 kg.  Code Status: Full DVT Prophylaxis: Lovenox Family Communication: Discussed all imaging results, lab results, explained to the patient   Disposition Plan:     Status is: Inpatient  Remains inpatient appropriate because:IV treatments appropriate due to intensity of illness or inability to take PO.  Hopefully can be discharged home soon once able to tolerate at least full liquid diet.   Dispo: The patient is from: Home              Anticipated d/c is to: Home              Anticipated d/c date is: > 3 days              Patient currently is not medically stable to d/c.       Time Spent in minutes 35 minutes  Procedures:  EGD  Consultants:   Gastroenterology  Antimicrobials:   Anti-infectives (From admission,  onward)   Start     Dose/Rate Route Frequency Ordered Stop   05/28/19 1000  fluconazole (DIFLUCAN) IVPB 200 mg     200 mg 100 mL/hr over 60 Minutes Intravenous Daily 05/27/19 1326     05/27/19 1400  fluconazole (DIFLUCAN) IVPB 400 mg     400 mg 100 mL/hr over 120 Minutes Intravenous  Once 05/27/19 1326 05/27/19 1616         Medications  Scheduled Meds: . Chlorhexidine Gluconate Cloth  6 each Topical Daily  . enoxaparin (LOVENOX) injection  40 mg Subcutaneous Q24H  . hydrALAZINE  5 mg Intravenous Q8H  . insulin aspart  0-9 Units Subcutaneous Q4H  . nicotine  21 mg Transdermal Daily  . pantoprazole (PROTONIX) IV  40 mg Intravenous Q12H  . sodium chloride flush  10-40 mL Intracatheter Q12H   Continuous Infusions: . dextrose 40 mL/hr at 06/01/19 1530  .  fluconazole (DIFLUCAN) IV 200 mg (06/01/19 1024)  . TPN ADULT (ION) 80 mL/hr at 06/01/19 0700  . TPN ADULT (ION)     PRN Meds:.acetaminophen **OR** acetaminophen, hydrALAZINE, HYDROmorphone (DILAUDID) injection, LORazepam **OR** LORazepam, ondansetron **OR** ondansetron (ZOFRAN) IV, sodium chloride flush      Subjective:   Ahmere Hemenway was seen and examined today.  Frail, waiting for endoscopy this morning BP soft.  No complaints.  Patient denies dizziness, chest pain, shortness of breath, new weakness, numbess, tingling. No acute events overnight.    Objective:   Vitals:   06/01/19 1527 06/01/19 1534 06/01/19 1549 06/01/19 1609  BP: (!) 87/66 91/71 107/85 108/79  Pulse: 64 (!) 55 69 62  Resp: 17 15 17 16   Temp:      TempSrc:      SpO2: 100% 100% 99%   Weight:      Height:        Intake/Output Summary (Last 24 hours) at 06/01/2019 1627 Last data filed at 06/01/2019 1037 Gross per 24 hour  Intake 2797.09 ml  Output 1250 ml  Net 1547.09 ml     Wt Readings from Last 3 Encounters:  06/01/19 61 kg  05/04/19 61.3 kg  04/29/19 60.8 kg    Physical Exam  General: Alert and awake, NAD  Cardiovascular: S1 S2 clear, RRR. No pedal edema b/l  Respiratory: CTA B  Gastrointestinal: Soft, mild epigastric TTP, nondistended, NBS  Ext: no pedal edema bilaterally  Neuro: no new deficits  Musculoskeletal: No cyanosis, clubbing  Skin: No rashes  Psych: Flat affect      Data Reviewed:  I have personally reviewed following labs and imaging studies  Micro Results Recent Results (from the past 240 hour(s))  SARS CORONAVIRUS 2 (TAT 6-24 HRS) Nasopharyngeal Nasopharyngeal Swab     Status: None   Collection Time: 05/23/19  2:04 PM   Specimen: Nasopharyngeal Swab  Result Value Ref Range Status   SARS Coronavirus 2 NEGATIVE NEGATIVE Final    Comment: (NOTE) SARS-CoV-2 target nucleic acids are NOT DETECTED. The SARS-CoV-2 RNA is generally detectable in upper and  lower respiratory specimens during the acute phase of infection. Negative results do not preclude SARS-CoV-2 infection, do not rule out co-infections with other pathogens, and should not be used as the sole basis for treatment or other patient management decisions. Negative results must be combined with clinical observations, patient history, and epidemiological information. The expected result is Negative. Fact Sheet for Patients: 05/25/19 Fact Sheet for Healthcare Providers: HairSlick.no This test is not yet approved or cleared by the quierodirigir.com FDA  and  has been authorized for detection and/or diagnosis of SARS-CoV-2 by FDA under an Emergency Use Authorization (EUA). This EUA will remain  in effect (meaning this test can be used) for the duration of the COVID-19 declaration under Section 56 4(b)(1) of the Act, 21 U.S.C. section 360bbb-3(b)(1), unless the authorization is terminated or revoked sooner. Performed at [facility], [place] [address], [place], [place] [address]     Radiology Reports DG Abd 1 View  Result Date: 05/27/2019 CLINICAL DATA:  Recent endoscopy, abdominal pain EXAM: ABDOMEN - 1 VIEW COMPARISON:  05/01/2019 FINDINGS: Two supine frontal views of the abdomen and pelvis are obtained. The lung bases are clear. Bowel gas pattern is unremarkable without obstruction or ileus. There are no masses or abnormal calcifications. Retained metallic BB right lower quadrant unchanged. No acute bony abnormalities. Extensive atherosclerosis. IMPRESSION: 1. Unremarkable bowel gas pattern. Electronically Signed   By: Randa Ngo M.D.   On: 05/27/2019 19:48   DG ESOPHAGUS DILATION  Result Date: 06/01/2019 ESOPHAGEAL DILATATION: Fluoroscopy was provided for use by the requesting physician.  No images were obtained for radiographic interpretation.  [place] EKG SITE RITE  Result Date: 05/27/2019 If Site Rite image not  attached, placement could not be confirmed due to current cardiac rhythm.   Lab Data:  CBC: Recent Labs  Lab 05/27/19 1309 05/28/19 0610 05/30/19 0725 06/01/19 0535  WBC 10.1 7.4 6.2 9.5  NEUTROABS 6.6 5.0 3.3 6.4  HGB 16.7 12.6* 15.7 17.2*  HCT 50.0 38.3* 47.3 51.9  MCV 94.2 97.2 97.1 96.3  PLT 226 158 201 242   Basic Metabolic Panel: Recent Labs  Lab 05/27/19 1309 05/28/19 0840 05/29/19 0454 05/30/19 0725 05/31/19 0600 06/01/19 0535  NA 135 136 141 137 138  --   K 4.6 4.0 3.8 4.4 4.5  --   CL 97* 106 106 106 103  --   CO2 27 25 28 26 26   --   GLUCOSE 92 117* 118* 107* 75  --   BUN 8 8 6 10 15   --   CREATININE 0.83 0.71 0.72 0.77 0.72  --   CALCIUM 9.7 8.7* 9.0 8.9 9.8  --   MG  --  1.9 2.0 2.2 2.2 2.0  PHOS  --  2.7 3.3 4.0  --  4.6   GFR: Estimated Creatinine Clearance: 94.3 mL/min (by C-G formula based on SCr of 0.72 mg/dL). Liver Function Tests: Recent Labs  Lab 05/27/19 1309 05/28/19 0840 05/29/19 0454 05/30/19 0725 05/31/19 0600  AST 18 15 15 17 23   ALT 13 11 11 11 8   ALKPHOS 86 72 66 67 79  BILITOT 0.9 0.7 0.5 0.3 0.6  PROT 7.3 6.0* 5.7* 6.4* 7.8  ALBUMIN 4.1 3.3* 3.3* 3.4* 4.2   No results for input(s): LIPASE, AMYLASE in the last 168 hours. No results for input(s): AMMONIA in the last 168 hours. Coagulation Profile: No results for input(s): INR, PROTIME in the last 168 hours. Cardiac Enzymes: No results for input(s): CKTOTAL, CKMB, CKMBINDEX, TROPONINI in the last 168 hours. BNP (last 3 results) No results for input(s): PROBNP in the last 8760 hours. HbA1C: No results for input(s): HGBA1C in the last 72 hours. CBG: Recent Labs  Lab 06/01/19 0340 06/01/19 0757 06/01/19 1212 06/01/19 1534 06/01/19 1616  GLUCAP 84 105* 93 105* 84   Lipid Profile: Recent Labs    06/01/19 0535  TRIG 52   Thyroid Function Tests: No results for input(s): TSH, T4TOTAL, FREET4, T3FREE, THYROIDAB in  the last 72 hours. Anemia Panel: No results for  input(s): VITAMINB12, FOLATE, FERRITIN, TIBC, IRON, RETICCTPCT in the last 72 hours. Urine analysis:    Component Value Date/Time   COLORURINE YELLOW 10/08/2006 1437   APPEARANCEUR CLEAR 10/08/2006 1437   LABSPEC 1.006 10/08/2006 1437   PHURINE 5.5 10/08/2006 1437   GLUCOSEU NEGATIVE 10/08/2006 1437   HGBUR NEGATIVE 10/08/2006 1437   BILIRUBINUR NEGATIVE 10/08/2006 1437   KETONESUR NEGATIVE 10/08/2006 1437   PROTEINUR NEGATIVE 10/08/2006 1437   UROBILINOGEN 0.2 10/08/2006 1437   NITRITE NEGATIVE 10/08/2006 1437   LEUKOCYTESUR  10/08/2006 1437    NEGATIVE MICROSCOPIC NOT DONE ON URINES WITH NEGATIVE PROTEIN, BLOOD, LEUKOCYTES, NITRITE, OR GLUCOSE <1000 mg/dL.     Thad Ranger M.D. [facility] 06/01/2019, 4:27 PM   Call night coverage person covering after 7pm

## 2019-06-01 NOTE — Op Note (Signed)
[facility] Patient Name: Ralph [name] Procedure Date: 06/01/2019 MRN: [id number] Attending MD: Vida Rigger , MD Date of Birth: 1965-07-07 CSN: [id number] Age: 54 Admit Type: Inpatient Procedure:                Upper GI endoscopy Indications:              Dysphagia tight stricture on recent endoscopy Providers:                Vida Rigger, MD, Dwain Sarna, RN, Beryle Beams,                            Technician, Mirian Mo, CRNA Referring MD:              Medicines:                Propofol total dose 255 mg IV Complications:            No immediate complications. Estimated Blood Loss:     Estimated blood loss: none. Procedure:                Pre-Anesthesia Assessment:                           - Prior to the procedure, a History and Physical                            was performed, and patient medications and                            allergies were reviewed. The patient's tolerance of                            previous anesthesia was also reviewed. The risks                            and benefits of the procedure and the sedation                            options and risks were discussed with the patient.                            All questions were answered, and informed consent                            was obtained. Prior Anticoagulants: The patient has                            taken no previous anticoagulant or antiplatelet                            agents. ASA Grade Assessment: II - A patient with                            mild systemic disease. After reviewing the risks  and benefits, the patient was deemed in                            satisfactory condition to undergo the procedure.                           After obtaining informed consent, the endoscope was                            passed under direct vision. Throughout the                            procedure, the patient's blood pressure, pulse, and             oxygen saturations were monitored continuously. The                            GIF-H190 ([id number]) Olympus gastroscope was                            introduced through the mouth, and advanced to the                            duodenal bulb. The upper GI endoscopy was                            accomplished without difficulty. The patient                            tolerated the procedure well. Scope In: Scope Out: Findings:      The larynx was normal.      Moderately severe esophagitis with no bleeding was found throughout the       esophagus.      One benign-appearing, intrinsic severe stenosis was found. The stenosis       was traversed after dilation. A TTS dilator was passed through the       scope. Dilation with a 6-7-8 mm balloon and an 09-13-08 mm balloon dilator       was performed to 10 mm under fluoroscopic guidance. The dilation site       was examined and showed mild mucosal disruption and moderate improvement       in luminal narrowing. We could get the scope easily through the       stricture after dilation to 10 mm and elected to proceed with biopsies       of the submucosal nodule instead of more aggressive dilations at this       time      A small hiatal hernia was present.      A single small submucosal nodule with a localized distribution was found       at the gastroesophageal junction. Biopsies were taken with a cold       forceps for histology.      The entire examined stomach was normal.      The duodenal bulb and first portion of the duodenum were normal.      The exam was otherwise without abnormality. Impression:               -  Normal larynx.                           - Moderately severe reflux esophagitis with no                            bleeding.                           - Benign-appearing esophageal stenosis. Dilated to                            10 mm.                           - Small hiatal hernia.                           - Submucosal  nodule found in the esophagus.                            Biopsied.                           - Normal stomach.                           - Normal duodenal bulb and first portion of the                            duodenum.                           - The examination was otherwise normal. Moderate Sedation:      Not Applicable - Patient had care per Anesthesia. Recommendation:           - Clear liquid diet today. If doing well full                            liquid diet tomorrow and hopefully can go home soon                            on twice a day pump inhibitors and avoid aspirin                            and nonsteroidals                           - Continue present medications.                           - Await pathology results.                           - Return to GI clinic PRN.                           - Telephone GI clinic for pathology  results in 1                            week.                           - Telephone GI clinic if symptomatic PRN.                           - Repeat upper endoscopy with repeat dilation in                            2-3 weeks for retreatment. Procedure Code(s):        --- Professional ---                           [phone], Esophagogastroduodenoscopy, flexible,                            transoral; with transendoscopic balloon dilation of                            esophagus (less than 30 mm diameter)                           43239, 59, Esophagogastroduodenoscopy, flexible,                            transoral; with biopsy, single or multiple Diagnosis Code(s):        --- Professional ---                           K21.00, Gastro-esophageal reflux disease with                            esophagitis, without bleeding                           K22.2, Esophageal obstruction                           K44.9, Diaphragmatic hernia without obstruction or                            gangrene                           K22.8, Other specified diseases of  esophagus                           R13.10, Dysphagia, unspecified CPT copyright 2019 American Medical Association. All rights reserved. The codes documented in this report are preliminary and upon coder review may  be revised to meet current compliance requirements. Vida Rigger, MD 06/01/2019 3:20:30 PM This report has been signed electronically. Number of Addenda: 0

## 2019-06-01 NOTE — Anesthesia Postprocedure Evaluation (Signed)
Anesthesia Post Note  Patient: Ralph [name]  Procedure(s) Performed: ESOPHAGOGASTRODUODENOSCOPY (EGD) (N/A ) BALLOON DILATION (N/A ) BIOPSY     Patient location during evaluation: PACU Anesthesia Type: MAC Level of consciousness: awake and alert Pain management: pain level controlled Vital Signs Assessment: post-procedure vital signs reviewed and stable Respiratory status: spontaneous breathing, nonlabored ventilation, respiratory function stable and patient connected to nasal cannula oxygen Cardiovascular status: stable and blood pressure returned to baseline Postop Assessment: no apparent nausea or vomiting Anesthetic complications: no    Last Vitals:  Vitals:   06/01/19 1527 06/01/19 1534  BP: (!) 87/66 91/71  Pulse: 64 (!) 55  Resp: 17 15  Temp:    SpO2: 100% 100%    Last Pain:  Vitals:   06/01/19 1534  TempSrc:   PainSc: Asleep                 Garyson Stelly S

## 2019-06-01 NOTE — Anesthesia Preprocedure Evaluation (Addendum)
Anesthesia Evaluation  Patient identified by MRN, date of birth, ID band Patient awake    Reviewed: Allergy & Precautions, NPO status , Patient's Chart, lab work & pertinent test results  Airway Mallampati: II  TM Distance: >3 FB     Dental   Pulmonary neg pulmonary ROS, Current Smoker and Patient abstained from smoking.,    breath sounds clear to auscultation       Cardiovascular hypertension, + CAD, + Past MI and + Peripheral Vascular Disease   Rhythm:Regular Rate:Normal     Neuro/Psych negative neurological ROS  negative psych ROS   GI/Hepatic Neg liver ROS, History noted CG   Endo/Other  negative endocrine ROS  Renal/GU negative Renal ROS  negative genitourinary   Musculoskeletal negative musculoskeletal ROS (+)   Abdominal   Peds negative pediatric ROS (+)  Hematology negative hematology ROS (+)   Anesthesia Other Findings   Reproductive/Obstetrics negative OB ROS                            Anesthesia Physical Anesthesia Plan  ASA: III  Anesthesia Plan: MAC   Post-op Pain Management:    Induction: Intravenous  PONV Risk Score and Plan: 1 and Propofol infusion  Airway Management Planned: Nasal Cannula and Simple Face Mask  Additional Equipment:   Intra-op Plan:   Post-operative Plan:   Informed Consent: I have reviewed the patients History and Physical, chart, labs and discussed the procedure including the risks, benefits and alternatives for the proposed anesthesia with the patient or authorized representative who has indicated his/her understanding and acceptance.     Dental advisory given  Plan Discussed with: CRNA and Anesthesiologist  Anesthesia Plan Comments:         Anesthesia Quick Evaluation

## 2019-06-01 NOTE — Progress Notes (Signed)
PHARMACY - TOTAL PARENTERAL NUTRITION CONSULT NOTE   Indication: prolonged malnutrition with significant weight loss, intolerance to enteral feeding related to dysphagia  Patient Measurements: Height: 5\' 8"  (172.7 cm) Weight: 61 kg (134 lb 7.7 oz) IBW/kg (Calculated) : 68.4 TPN AdjBW (KG): 61.2 Body mass index is 20.45 kg/m. Usual Weight: 77 kg  Assessment:  54 y/o M hospitalized in March for worsening dysphagia was found to have severe ulcerative esophagits, Barrett's esophagus. Patient reports very hard time swallowing solid foods and liquids with reported weight loss of 30 pounds. Pharmacy consulted to initiate TPN.   Glucose / Insulin: CBGs 84-125 in past 24hrs.  No insulin administered.  Dextrose at 20% in TPN and on D5W infusion increased to 58ml/hr.  Also received 25gm IV D50 bolus x1.   Electrolytes: WNL Renal: SCr stable LFTs / TGs: WNL Prealbumin / albumin: 14.5 (4/22)/3.3, 24.9/4.2 (4/26)  Intake / Output; MIVF: net -1.3L GI Imaging: 4/21 EGD with moderate to severe esophagitis without bleeding Surgeries / Procedures: EGD dilation planned for 4/26  Central access: 4/21 TPN start date: 4/22  Nutritional Goals (per RD recommendation on 4/21): kCal: 1800-2025, Protein: 90-110, Fluid: >/= 1.8 L/day Goal TPN rate is 80 mL/hr (provides 96 g of protein and 2016 kcals per day) with 20% dextrose and 17 g/L lipids  Current Nutrition:  TPN at goal rate  Plan:  At 1800: Continue TPN at goal rate 65mL/hr.   Dextrose concentration at 20%.  This is at MAX glucose infusion rate when combined with peripherally infusing D5W at 49ml/hr.   Electrolytes in TPN: 77mEq/L of Na, 59mEq/L of K, 81mEq/L of Ca, 58mEq/L of Mg, and 70mmol/L of Phos. Cl:Ac ratio 1:1 Add standard MVI, trace elements, folic acid, and thiamine to TPN daily Sensitive SSI & CBG monitoring q8h D5W @ 40 mL/hr per MD.   Monitor TPN labs on Mon/Thurs  12m RPh 06/01/2019, 9:20 AM

## 2019-06-02 LAB — COMPREHENSIVE METABOLIC PANEL
ALT: 50 U/L — ABNORMAL HIGH (ref 0–44)
AST: 38 U/L (ref 15–41)
Albumin: 3.5 g/dL (ref 3.5–5.0)
Alkaline Phosphatase: 73 U/L (ref 38–126)
Anion gap: 6 (ref 5–15)
BUN: 16 mg/dL (ref 6–20)
CO2: 24 mmol/L (ref 22–32)
Calcium: 9.4 mg/dL (ref 8.9–10.3)
Chloride: 104 mmol/L (ref 98–111)
Creatinine, Ser: 0.72 mg/dL (ref 0.61–1.24)
GFR calc Af Amer: >60 mL/min (ref >60)
GFR calc non Af Amer: >60 mL/min (ref >60)
Glucose, Bld: 133 mg/dL — ABNORMAL HIGH (ref 70–99)
Potassium: 4.5 mmol/L (ref 3.5–5.1)
Sodium: 134 mmol/L — ABNORMAL LOW (ref 135–145)
Total Bilirubin: 0.6 mg/dL (ref 0.3–1.2)
Total Protein: 6.7 g/dL (ref 6.5–8.1)

## 2019-06-02 LAB — MAGNESIUM: Magnesium: 1.9 mg/dL (ref 1.7–2.4)

## 2019-06-02 LAB — GLUCOSE, CAPILLARY
Glucose-Capillary: 92 mg/dL (ref 70–99)
Glucose-Capillary: 119 mg/dL — ABNORMAL HIGH (ref 70–99)
Glucose-Capillary: 115 mg/dL — ABNORMAL HIGH (ref 70–99)
Glucose-Capillary: 102 mg/dL — ABNORMAL HIGH (ref 70–99)
Glucose-Capillary: 116 mg/dL — ABNORMAL HIGH (ref 70–99)

## 2019-06-02 LAB — SURGICAL PATHOLOGY

## 2019-06-02 MED ORDER — PANTOPRAZOLE SODIUM 40 MG PO TBEC
40.0000 mg | DELAYED_RELEASE_TABLET | Freq: Two times a day (BID) | ORAL | 0 refills | Status: DC
Start: 2019-06-02 — End: 2019-07-03

## 2019-06-02 MED ORDER — FLUCONAZOLE 100 MG PO TABS
200.0000 mg | ORAL_TABLET | Freq: Every day | ORAL | 0 refills | Status: DC
Start: 2019-06-03 — End: 2019-06-11

## 2019-06-02 NOTE — Progress Notes (Signed)
[facility] Progress Note  Ralph [name] 54 y.o. [birth year]  CC:  Dysphagia  Subjective: Patient reports feeling better with improvement in his dysphagia after dilation yesterday.  He is interested in going home.  He reports only minimal odynophagia and minimal abdominal pain.  He denies nausea or vomiting.  He tolerated a liquid diet this morning.  ROS : Review of Systems  Respiratory: Negative for cough and shortness of breath.   Cardiovascular: Negative for chest pain and palpitations.  Gastrointestinal: Positive for abdominal pain (mild, epigastric). Negative for blood in stool, constipation, diarrhea, heartburn, melena, nausea and vomiting.   Objective: Vital signs in last 24 hours: Vitals:   06/02/19 0016 06/02/19 0415  BP: 109/86 112/72  Pulse: 84 88  Resp: 16 16  Temp: 98.8 F (37.1 C) 98.6 F (37 C)  SpO2: 98% 96%    Physical Exam:  General:  Lethargic, thin, cooperative, no distress  Head:  Normocephalic, without obvious abnormality, atraumatic  Eyes:  Anicteric sclera, EOMs intact,   Lungs:   Clear to auscultation bilaterally, respirations unlabored  Heart:  Regular rate and rhythm, S1, S2 normal  Abdomen:   Soft, mild epigastric tenderness without guarding, bowel sounds active all four quadrants, no peritoneal signs   Extremities: Extremities normal, atraumatic, no  edema  Pulses: 2+ and symmetric    Lab Results: Recent Labs    05/31/19 0600 05/31/19 0600 06/01/19 0535 06/02/19 0520  NA 138  --   --  134*  K 4.5  --   --  4.5  CL 103  --   --  104  CO2 26  --   --  24  GLUCOSE 75  --   --  133*  BUN 15  --   --  16  CREATININE 0.72  --   --  0.72  CALCIUM 9.8  --   --  9.4  MG 2.2   < > 2.0 1.9  PHOS  --   --  4.6  --    < > = values in this interval not displayed.   Recent Labs    05/31/19 0600 06/02/19 0520  AST 23 38  ALT 8 50*  ALKPHOS 79 73  BILITOT 0.6 0.6  PROT 7.8 6.7  ALBUMIN 4.2 3.5   Recent Labs    06/01/19 0535   WBC 9.5  NEUTROABS 6.4  HGB 17.2*  HCT 51.9  MCV 96.3  PLT 208   No results for input(s): LABPROT, INR in the last 72 hours.   Assessment/Plan: Moderately severe esophagitis without bleeding Distal esophageal stricture  s/p dilation to 9mm yesterday -Continue IV Protonix q12h; will transition to PO PPI at discharge -Continue diflucan IV qd -Await biopsy results -Full liquid diet today -Avoid ASA/NSAIDS -Patient will need repeat EGD with dilation in 2-3 weeks  Edrick Kins PA-C 06/02/2019, 11:11 AM  Contact #  [phone]

## 2019-06-02 NOTE — Discharge Summary (Signed)
Physician Discharge Summary  Ralph [name]:[id number] DOB: 1965/12/31 DOA: 05/27/2019  PCP: Moses Manners, MD  Admit date: 05/27/2019 Discharge date: 06/02/2019  Admitted From: Home Disposition: Home  Recommendations for Outpatient Follow-up:  1. Follow up with PCP in 1-2 weeks 2. Follow-up with gastroenterology in 2-3 weeks for repeat EGD 3. Continue Protonix 40 mg p.o. twice daily 4. Continue fluconazole 200 mg p.o. daily to complete 14-day course for esophagitis 5. Continue discussions regarding alcohol cessation 6. Follow-up biopsy from EGD which was pending at time of discharge from 06/01/2019.  Home Health: No Equipment/Devices: None  Discharge Condition: Stable CODE STATUS: Full code Diet recommendation: Cardiac diet  History of present illness:  Ralph [name] is a 54 year old with history of unspecified CVA, CAD, HTN, HLD diagnosed with severe ulcerative esophagitis about a month ago placed on PPI. Continue to have worsening of dysphagia to solids and liquids. He has lost about 30 pounds in the last several months. GI performed repeat endoscopy is still showing severe esophagitis. Admitted for nutritional support with PICC line and TPN.  Hospital course:  Dysphagia Moderate-severe esophagitis Distal esophageal stricture Patient presenting with continued dysphagia and 30 pound weight loss.  EGD 05/27/2019 notable for severe esophagitis and distal stricture.  Biopsy positive for Candida and negative for dysplasia or malignancy.  Patient was supported with IV TPN, Diflucan and PPI IV every 12 hours during his hospitalization.  Repeat EGD 06/01/2019 with moderately severe reflux esophagitis and esophageal stenosis with dilation to 10 mm.  Patient's diet was transitioned to full liquids with good toleration.  GI recommends avoiding aspirin and NSAIDs following discharge.  Continue Protonix 40 mg p.o. twice daily.  Continue fluconazole 200 mg p.o. daily to complete a 14-day  course.  Biopsies pending from EGD 06/01/2019.  Follow-up with gastroenterology for repeat endoscopy and dilation in 2-3 weeks.  Essential hypertension Controlled.  Continue home amlodipine 5 mg p.o. daily  Hyperlipidemia Continue home atorvastatin  History of nicotine use Counseled on tobacco cessation  History of alcohol use Continued discussed need for complete cessation.   Severe protein calorie malnutrition Body mass index is 20.45 kg/m. Nutrition Status: Nutrition Problem: Inadequate oral intake Etiology: dysphagia(moderately severe esophagitis with distal esophageal stricture) Signs/Symptoms: NPO status Interventions: TPN   Discharge Diagnoses:  Principal Problem:   Esophagitis Active Problems:   Essential hypertension, benign   Dysphagia   Weight loss   Tobacco use disorder   Dehydration    Discharge Instructions  Discharge Instructions    Call MD for:  difficulty breathing, headache or visual disturbances   Complete by: As directed    Call MD for:  extreme fatigue   Complete by: As directed    Call MD for:  persistant dizziness or light-headedness   Complete by: As directed    Call MD for:  persistant nausea and vomiting   Complete by: As directed    Call MD for:  severe uncontrolled pain   Complete by: As directed    Call MD for:  temperature >100.4   Complete by: As directed    Diet - low sodium heart healthy   Complete by: As directed    Increase activity slowly   Complete by: As directed      Allergies as of 06/02/2019   No Known Allergies     Medication List    STOP taking these medications   cyclobenzaprine 10 MG tablet Commonly known as: FLEXERIL     TAKE these medications  amLODipine 5 MG tablet Commonly known as: NORVASC Take 1 tablet (5 mg total) by mouth daily.   atorvastatin 80 MG tablet Commonly known as: LIPITOR Take 1 tablet (80 mg total) by mouth daily.   fluconazole 100 MG tablet Commonly known as:  Diflucan Take 2 tablets (200 mg total) by mouth daily for 8 days. Start taking on: June 03, 2019   oxyCODONE-acetaminophen 5-325 MG tablet Commonly known as: Percocet Take 1 tablet by mouth every 8 (eight) hours as needed for severe pain.   pantoprazole 40 MG tablet Commonly known as: Protonix Take 1 tablet (40 mg total) by mouth 2 (two) times daily.      Follow-up Information    [name], [name], MD. Schedule an appointment as soon as possible for a visit in 1 week(s).   Specialty: Family Medicine Contact information: [address] [place] [address] [phone]        Vida Rigger, MD. Schedule an appointment as soon as possible for a visit in 2 week(s).   Specialty: Gastroenterology Contact information: [address] [place] [address] [phone]          No Known Allergies  Consultations:  [facility] gastroenterology - Dr Ewing Schlein   Procedures/Studies: DG Abd 1 View  Result Date: 05/27/2019 CLINICAL DATA:  Recent endoscopy, abdominal pain EXAM: ABDOMEN - 1 VIEW COMPARISON:  05/01/2019 FINDINGS: Two supine frontal views of the abdomen and pelvis are obtained. The lung bases are clear. Bowel gas pattern is unremarkable without obstruction or ileus. There are no masses or abnormal calcifications. Retained metallic BB right lower quadrant unchanged. No acute bony abnormalities. Extensive atherosclerosis. IMPRESSION: 1. Unremarkable bowel gas pattern. Electronically Signed   By: Sharlet Salina M.D.   On: 05/27/2019 19:48   DG ESOPHAGUS DILATION  Result Date: 06/01/2019 ESOPHAGEAL DILATATION: Fluoroscopy was provided for use by the requesting physician.  No images were obtained for radiographic interpretation.  [place] EKG SITE RITE  Result Date: 05/27/2019 If Site Rite image not attached, placement could not be confirmed due to current cardiac rhythm.     Subjective: Patient seen and examined at bedside, requesting discharge home this  afternoon.  Currently tolerating clear liquid diet this morning now transition to full liquid.  Nursing reports doing well without any issues with full liquids.  Seen by GI this morning okay for discharge home and follow-up outpatient in 2-3 weeks.  No other complaints or concerns at this time.  Denies headache, no fever/chills/night sweats, no nausea/vomiting/diarrhea, no chest pain, palpitations, no abdominal pain, no weakness.  No acute events overnight per nursing staff.  Discharge Exam: Vitals:   06/02/19 0415 06/02/19 1354  BP: 112/72 115/84  Pulse: 88 66  Resp: 16 16  Temp: 98.6 F (37 C) 98.4 F (36.9 C)  SpO2: 96% 98%   Vitals:   06/01/19 2007 06/02/19 0016 06/02/19 0415 06/02/19 1354  BP: 110/84 109/86 112/72 115/84  Pulse: 79 84 88 66  Resp: 18 16 16 16   Temp: 98.5 F (36.9 C) 98.8 F (37.1 C) 98.6 F (37 C) 98.4 F (36.9 C)  TempSrc: Oral Oral Oral Oral  SpO2: 97% 98% 96% 98%  Weight:      Height:        General: Pt is alert, awake, not in acute distress Cardiovascular: RRR, S1/S2 +, no rubs, no gallops Respiratory: CTA bilaterally, no wheezing, no rhonchi Abdominal: Soft, NT, ND, bowel sounds + Extremities: no edema, no cyanosis    The  results of significant diagnostics from this hospitalization (including imaging, microbiology, ancillary and laboratory) are listed below for reference.     Microbiology: No results found for this or any previous visit (from the past 240 hour(s)).   Labs: BNP (last 3 results) No results for input(s): BNP in the last 8760 hours. Basic Metabolic Panel: Recent Labs  Lab 05/28/19 0840 05/28/19 0840 05/29/19 0454 05/30/19 0725 05/31/19 0600 06/01/19 0535 06/02/19 0520  NA 136  --  141 137 138  --  134*  K 4.0  --  3.8 4.4 4.5  --  4.5  CL 106  --  106 106 103  --  104  CO2 25  --  28 26 26   --  24  GLUCOSE 117*  --  118* 107* 75  --  133*  BUN 8  --  6 10 15   --  16  CREATININE 0.71  --  0.72 0.77 0.72  --  0.72   CALCIUM 8.7*  --  9.0 8.9 9.8  --  9.4  MG 1.9   < > 2.0 2.2 2.2 2.0 1.9  PHOS 2.7  --  3.3 4.0  --  4.6  --    < > = values in this interval not displayed.   Liver Function Tests: Recent Labs  Lab 05/28/19 0840 05/29/19 0454 05/30/19 0725 05/31/19 0600 06/02/19 0520  AST 15 15 17 23  38  ALT 11 11 11 8  50*  ALKPHOS 72 66 67 79 73  BILITOT 0.7 0.5 0.3 0.6 0.6  PROT 6.0* 5.7* 6.4* 7.8 6.7  ALBUMIN 3.3* 3.3* 3.4* 4.2 3.5   No results for input(s): LIPASE, AMYLASE in the last 168 hours. No results for input(s): AMMONIA in the last 168 hours. CBC: Recent Labs  Lab 05/27/19 1309 05/28/19 0610 05/30/19 0725 06/01/19 0535  WBC 10.1 7.4 6.2 9.5  NEUTROABS 6.6 5.0 3.3 6.4  HGB 16.7 12.6* 15.7 17.2*  HCT 50.0 38.3* 47.3 51.9  MCV 94.2 97.2 97.1 96.3  PLT 226 158 201 208   Cardiac Enzymes: No results for input(s): CKTOTAL, CKMB, CKMBINDEX, TROPONINI in the last 168 hours. BNP: Invalid input(s): POCBNP CBG: Recent Labs  Lab 06/01/19 2037 06/02/19 0101 06/02/19 0413 06/02/19 0733 06/02/19 1208  GLUCAP 105* 92 119* 115* 102*   D-Dimer No results for input(s): DDIMER in the last 72 hours. Hgb A1c No results for input(s): HGBA1C in the last 72 hours. Lipid Profile Recent Labs    06/01/19 0535  TRIG 52   Thyroid function studies No results for input(s): TSH, T4TOTAL, T3FREE, THYROIDAB in the last 72 hours.  Invalid input(s): FREET3 Anemia work up No results for input(s): VITAMINB12, FOLATE, FERRITIN, TIBC, IRON, RETICCTPCT in the last 72 hours. Urinalysis    Component Value Date/Time   COLORURINE YELLOW 10/08/2006 1437   APPEARANCEUR CLEAR 10/08/2006 1437   LABSPEC 1.006 10/08/2006 1437   PHURINE 5.5 10/08/2006 1437   GLUCOSEU NEGATIVE 10/08/2006 1437   HGBUR NEGATIVE 10/08/2006 1437   BILIRUBINUR NEGATIVE 10/08/2006 1437   KETONESUR NEGATIVE 10/08/2006 1437   PROTEINUR NEGATIVE 10/08/2006 1437   UROBILINOGEN 0.2 10/08/2006 1437   NITRITE NEGATIVE  10/08/2006 1437   LEUKOCYTESUR  10/08/2006 1437    NEGATIVE MICROSCOPIC NOT DONE ON URINES WITH NEGATIVE PROTEIN, BLOOD, LEUKOCYTES, NITRITE, OR GLUCOSE <1000 mg/dL.   Sepsis Labs Invalid input(s): PROCALCITONIN,  WBC,  LACTICIDVEN Microbiology No results found for this or any previous visit (from the past 240 hour(s)).   Time coordinating  discharge: Over 30 minutes  SIGNED:   Odaly Peri J [place] DO  [facility] 06/02/2019, 2:23 PM

## 2019-06-02 NOTE — Progress Notes (Signed)
Nursing Discharge Summary  Patient ID: MARQUAVIOUS NAZAR MRN: [id number] DOB/AGE: 1965/10/19 54 y.o.  Admit date: 05/27/2019 Discharge date: 06/02/2019  Discharged Condition: good  Disposition: Discharge disposition: 01-Home or Self Care       Follow-up Information    [name], [name], MD. Schedule an appointment as soon as possible for a visit in 1 week(s).   Specialty: Family Medicine Contact information: [address] [place] [address] [phone]        Vida Rigger, MD. Schedule an appointment as soon as possible for a visit in 2 week(s).   Specialty: Gastroenterology Contact information: [address] [place] [address] [phone]           Prescriptions Given: Patient prescriptions for Protonix and Diflucan called into [facility].  Patient medications and follow up appointments discussed.  Patient PICC line removed by IV team and patient waited 40 mins after removal.    Means of Discharge: Patient stable and taken downstairs via wheelchair to be discharged home via private vehicle.    Signed: Gloriajean Dell 06/02/2019, 4:22 PM

## 2019-06-02 NOTE — Progress Notes (Signed)
PHARMACY - TOTAL PARENTERAL NUTRITION CONSULT NOTE   Indication: prolonged malnutrition with significant weight loss, intolerance to enteral feeding related to dysphagia  Patient Measurements: Height: 5\' 8"  (172.7 cm) Weight: 61 kg (134 lb 7.7 oz) IBW/kg (Calculated) : 68.4 TPN AdjBW (KG): 61 Body mass index is 20.45 kg/m. Usual Weight: 77 kg  Assessment:  54 y/o M hospitalized in March for worsening dysphagia was found to have severe ulcerative esophagits, Barrett's esophagus. Patient reports very hard time swallowing solid foods and liquids with reported weight loss of 30 pounds. Pharmacy consulted to initiate TPN.   Glucose / Insulin: CBGs 84-133 in past 24hrs.  No insulin administered.  Dextrose at 20% in TPN and on D5W infusion increased to 59ml/hr.  Also received 25gm IV D50 bolus x1.   Electrolytes: Na slightly low all othersWNL Renal: SCr stable LFTs / TGs: ALT 50, AST 38 Prealbumin / albumin: 14.5 (4/22)/3.3, 24.9/4.2 (4/26)  Intake / Output; MIVF: +2296 GI Imaging: 4/21 EGD with moderate to severe esophagitis without bleeding Surgeries / Procedures: EGD 4/26  Central access: 4/21 TPN start date: 4/22  Nutritional Goals (per RD recommendation on 4/21): kCal: 1800-2025, Protein: 90-110, Fluid: >/= 1.8 L/day Goal TPN rate is 80 mL/hr (provides 96 g of protein and 2016 kcals per day) with 20% dextrose and 17 g/L lipids  Current Nutrition:  TPN at goal rate CLD  Plan:  Tolerating CLD, increasing to fulls Decrease TPN to 5/21 x2  Hours then stop Sensitive SSI & CBG monitoring q8h D5W @ 40 mL/hr per MD.   Stop TPN labs  RPh 06/02/2019, 9:38 AM

## 2019-06-02 NOTE — Discharge Instructions (Signed)
Esophagitis  Esophagitis is inflammation of the esophagus. The esophagus is the tube that carries food from your mouth to your stomach. Esophagitis can cause soreness or pain in the esophagus. This condition can make it difficult and painful to swallow. What are the causes? Most causes of esophagitis are not serious. Common causes of this condition include:  Gastroesophageal reflux disease (GERD). This is when stomach contents move back up into the esophagus (reflux).  Repeated vomiting.  An allergic reaction, especially caused by food allergies (eosinophilic esophagitis).  Injury to the esophagus by swallowing large pills with or without water, or swallowing certain types of medicines.  Swallowing (ingesting) harmful chemicals, such as household cleaning products.  Heavy alcohol use.  An infection of the esophagus. This most often occurs in people who have a weakened immune system.  Radiation or chemotherapy treatment for cancer.  Certain diseases such as sarcoidosis, Crohn's disease, and scleroderma. What are the signs or symptoms? Symptoms of this condition include:  Difficult or painful swallowing.  Pain with swallowing acidic liquids, such as citrus juices.  Pain with burping.  Chest pain.  Difficulty breathing.  Nausea.  Vomiting.  Pain in the abdomen.  Weight loss.  Ulcers in the mouth.  Patches of white material in the mouth (candidiasis).  Fever.  Coughing up blood or vomiting blood.  Stool that is black, tarry, or bright red. How is this diagnosed? Your health care provider will take a medical history and perform a physical exam. You may also have other tests, including:  An endoscopy to examine your esophagus and stomach with a small flexible tube with a camera.  A test that measures the acidity level in your esophagus.  A test that measures how much pressure is on your esophagus.  A barium swallow or modified barium swallow to show the shape,  size, and functioning of your esophagus.  Allergy tests. How is this treated? Treatment for this condition depends on the cause of your esophagitis. In some cases, steroids or other medicines may be given to help relieve your symptoms or to treat the underlying cause of your condition. You may have to make some lifestyle changes, such as:  Avoiding alcohol.  Quitting smoking.  Changing your diet.  Exercising.  Changing your sleep habits and your sleep environment. Follow these instructions at home: Medicines  Take over-the-counter and prescription medicines only as told by your health care provider.  Do not take aspirin, ibuprofen, or other NSAIDs unless your health care provider told you to do so.  If you have trouble taking pills: ? Use a pill splitter to decrease the size of the pill. This will decrease the chance of the pill getting stuck or injuring your esophagus. ? Drink water after you take a pill. Eating and drinking   Avoid foods and drinks that seem to make your symptoms worse.  Follow a diet as recommended by your health care provider. This may involve avoiding foods and drinks such as: ? Coffee and tea (with or without caffeine). ? Drinks that contain alcohol. ? Energy drinks and sports drinks. ? Carbonated drinks or sodas. ? Chocolate and cocoa. ? Peppermint and mint flavorings. ? Garlic and onions. ? Horseradish. ? Spicy and acidic foods, including peppers, chili powder, curry powder, vinegar, hot sauces, and barbecue sauce. ? Citrus fruit juices and citrus fruits, such as oranges, lemons, and limes. ? Tomato-based foods, such as red sauce, chili, salsa, and pizza with red sauce. ? Fried and fatty foods, such as   donuts, french fries, potato chips, and high-fat dressings. ? High-fat meats, such as hot dogs and fatty cuts of red and white meats, such as rib eye steak, sausage, ham, and bacon. ? High-fat dairy items, such as whole milk, butter, and cream  cheese. Lifestyle  Eat small, frequent meals instead of large meals.  Avoid drinking large amounts of liquid with your meals.  Avoid eating meals during the 2-3 hours before bedtime.  Avoid lying down right after you eat.  Do not exercise right after you eat.  Do not use any products that contain nicotine or tobacco, such as cigarettes and e-cigarettes. If you need help quitting, ask your health care provider. General instructions   Pay attention to any changes in your symptoms. Let your health care provider know about them.  Wear loose-fitting clothing. Do not wear anything tight around your waist that causes pressure on your abdomen.  Raise (elevate) the head of your bed about 6 inches (15 cm).  Try relaxation strategies such as yoga, deep breathing, or meditation to manage stress. If you need help reducing stress, ask your health care provider.  If you are overweight, reduce your weight to an amount that is healthy for you. Ask your health care provider for guidance about a safe weight loss goal.  Keep all follow-up visits as told by your health care provider. This is important. Contact a health care provider if:  You have new symptoms.  You have unexplained weight loss.  You have difficulty swallowing, or it hurts to swallow.  You have wheezing or a cough that does not go away.  Your symptoms do not improve with treatment.  You have frequent heartburn for more than two weeks. Get help right away if:  You have severe pain in your arms, neck, jaw, teeth, or back.  You feel sweaty, dizzy, or light-headed.  You have chest pain or shortness of breath.  You vomit and your vomit looks like blood or coffee grounds.  Your stool is bloody or black.  You have a fever.  You cannot swallow, drink, or eat. Summary  Esophagitis is inflammation of the esophagus.  Most causes of esophagitis are not serious.  Follow your health care provider's instructions about eating  and drinking. Follow instructions on medicines.  Contact a health care provider if you have new symptoms, have weight loss, or coughing that does not stop.  Get help right away if you have severe pain in the arms, neck, jaw, teeth or back, or if you have chest pain, shortness of breath, or fever. This information is not intended to replace advice given to you by your health care provider. Make sure you discuss any questions you have with your health care provider. Document Revised: 09/13/2017 Document Reviewed: 09/13/2017 Elsevier Patient Education  2020 Elsevier Inc.   Esophageal Dilatation Esophageal dilatation, also called esophageal dilation, is a procedure to widen or open (dilate) a blocked or narrowed part of the esophagus. The esophagus is the part of the body that moves food and liquid from the mouth to the stomach. You may need this procedure if:  You have a buildup of scar tissue in your esophagus that makes it difficult, painful, or impossible to swallow. This can be caused by gastroesophageal reflux disease (GERD).  You have cancer of the esophagus.  There is a problem with how food moves through your esophagus. In some cases, you may need this procedure repeated at a later time to dilate the esophagus gradually. Tell  a health care provider about:  Any allergies you have.  All medicines you are taking, including vitamins, herbs, eye drops, creams, and over-the-counter medicines.  Any problems you or family members have had with anesthetic medicines.  Any blood disorders you have.  Any surgeries you have had.  Any medical conditions you have.  Any antibiotic medicines you are required to take before dental procedures.  Whether you are pregnant or may be pregnant. What are the risks? Generally, this is a safe procedure. However, problems may occur, including:  Bleeding due to a tear in the lining of the esophagus.  A hole (perforation) in the esophagus. What  happens before the procedure?  Follow instructions from your health care provider about eating or drinking restrictions.  Ask your health care provider about changing or stopping your regular medicines. This is especially important if you are taking diabetes medicines or blood thinners.  Plan to have someone take you home from the hospital or clinic.  Plan to have a responsible adult care for you for at least 24 hours after you leave the hospital or clinic. This is important. What happens during the procedure?  You may be given a medicine to help you relax (sedative).  A numbing medicine may be sprayed into the back of your throat, or you may gargle the medicine.  Your health care provider may perform the dilatation using various surgical instruments, such as: ? Simple dilators. This instrument is carefully placed in the esophagus to stretch it. ? Guided wire bougies. This involves using an endoscope to insert a wire into the esophagus. A dilator is passed over this wire to enlarge the esophagus. Then the wire is removed. ? Balloon dilators. An endoscope with a small balloon at the end is inserted into the esophagus. The balloon is inflated to stretch the esophagus and open it up. The procedure may vary among health care providers and hospitals. What happens after the procedure?  Your blood pressure, heart rate, breathing rate, and blood oxygen level will be monitored until the medicines you were given have worn off.  Your throat may feel slightly sore and numb. This will improve slowly over time.  You will not be allowed to eat or drink until your throat is no longer numb.  When you are able to drink, urinate, and sit on the edge of the bed without nausea or dizziness, you may be able to return home. Follow these instructions at home:  Take over-the-counter and prescription medicines only as told by your health care provider.  Do not drive for 24 hours if you were given a sedative  during your procedure.  You should have a responsible adult with you for 24 hours after the procedure.  Follow instructions from your health care provider about any eating or drinking restrictions.  Do not use any products that contain nicotine or tobacco, such as cigarettes and e-cigarettes. If you need help quitting, ask your health care provider.  Keep all follow-up visits as told by your health care provider. This is important. Get help right away if you:  Have a fever.  Have chest pain.  Have pain that is not relieved by medication.  Have trouble breathing.  Have trouble swallowing.  Vomit blood. Summary  Esophageal dilatation, also called esophageal dilation, is a procedure to widen or open (dilate) a blocked or narrowed part of the esophagus.  Plan to have someone take you home from the hospital or clinic.  For this procedure, a numbing  medicine may be sprayed into the back of your throat, or you may gargle the medicine.  Do not drive for 24 hours if you were given a sedative during your procedure. This information is not intended to replace advice given to you by your health care provider. Make sure you discuss any questions you have with your health care provider. Document Revised: 11/19/2018 Document Reviewed: 11/27/2016 Elsevier Patient Education  [employer].   Esophageal Stricture  Esophageal stricture is a narrowing (stricture) of the esophagus. The esophagus is the part of the body that moves food and liquid from your mouth to your stomach. The esophagus can become narrow because of disease or damage to the area. This condition can make swallowing difficult, painful, or even impossible. It also makes choking more likely. What are the causes? The most common cause of this condition is gastroesophageal reflux disease (GERD). Normally, food travels down the esophagus and stays in the stomach to be digested. In GERD, food and stomach acid move back up into  the esophagus. Over time, this causes scar tissue and leads to narrowing. Other causes of esophageal stricture include:  Scarring from swallowing (ingesting) a harmful substance.  Damage from medical instruments used in the esophagus.  Radiation therapy.  Cancer.  Inflammation of the esophagus. What increases the risk? You are more likely to develop an esophageal stricture if you have GERD or esophageal cancer. What are the signs or symptoms? Symptoms of this condition include:  Difficulty swallowing.  Pain when swallowing.  Burning pain or discomfort in the throat or chest (heartburn).  Vomiting or spitting up (regurgitating) food or liquids.  Unexplained weight loss. How is this diagnosed? This condition may be diagnosed based on:  Your symptoms and a physical exam.  Tests, such as: ? Upper endoscopy. Your health care provider will insert a flexible tube with a tiny camera on it (endoscope) into your esophagus to check for a stricture. A tissue sample (biopsy) may also be taken to be examined under a microscope. ? Esophageal pH monitoring. This test involves using a tube to collect acid in the esophagus to determine how much stomach acid is entering the esophagus. ? Barium swallow test. For this test, you will drink a chalky liquid (barium solution) that coats the lining of the esophagus. Then you will have an X-ray taken. The barium solution helps to show if there is a stricture. How is this treated? Treatment for esophageal stricture depends on what is causing your condition and how severe your condition is. Treatment options include:  Esophageal dilation. In this procedure, a health care provider inserts an endoscope or a tool called a dilator into the esophagus to gently stretch it and make the opening wider.  Stents. In some cases, a health care provider may place a small device (stent) in the esophagus to keep it open.  Acid-blocking medicines. Taking these can help  you manage GERD symptoms after an esophageal stricture. Controlling your GERD symptoms or being free of them can prevent the stricture from returning. Follow these instructions at home: Eating and drinking      Follow instructions from your health care provider about any diet changes.  Cut your food into small pieces, chew well, and eat slowly  Try to eat soft food that is easier to swallow.  Eat and drink only when you are sitting upright.  Do not drink alcohol. If you need help quitting, ask your health care provider.  Do not eat during the 3  hours before bedtime.  Do not overeat at meals.  Do not eat foods that can make reflux worse. These include: ? Fatty foods, such as red meat and processed foods. ? Spicy foods. ? Soda. ? Tomato products. ? Chocolate. General instructions  Take over-the-counter and prescription medicines only as told by your health care provider.  Do not use any products that contain nicotine or tobacco, such as cigarettes and e-cigarettes. If you need help quitting, ask your health care provider.  Lose weight if you are overweight.  Wear loose, comfortable clothing.  When lying in bed, raise (elevate) your head with pillows. This will help to prevent your stomach contents from backing up into your esophagus while you sleep.  Keep all follow-up visits as told by your health care provider. This is important. Contact a health care provider if:  You have problems eating or swallowing.  You regurgitate food and liquid.  Your symptoms do not improve with treatment. Get help right away if:  You can no longer keep down any food, drinks, or your saliva. Summary  Esophageal stricture is a narrowing of the part of the body that moves food and liquid from your mouth to your stomach (esophagus).  The esophagus can become narrow because of disease or damage to the area. This can make swallowing difficult, painful, or even impossible.  Treatment for  esophageal stricture depends on what is causing your condition and how severe your condition is. In some cases, procedures may be done to make the opening of the esophagus wider or to place a stent in the esophagus to keep it open.  Do not drink alcohol, overeat at meals, or eat foods that can make reflux worse. This information is not intended to replace advice given to you by your health care provider. Make sure you discuss any questions you have with your health care provider. Document Revised: 04/19/2017 Document Reviewed: 09/28/2016 Elsevier Patient Education  2020 [employer].

## 2019-06-03 ENCOUNTER — Encounter: Payer: Self-pay | Admitting: *Deleted

## 2019-06-03 ENCOUNTER — Telehealth: Payer: Self-pay | Admitting: Family Medicine

## 2019-06-03 NOTE — Telephone Encounter (Signed)
Called patient.  Given biopsy results - benign.  Also encouraged to see me in follow up.  He states that he is feeling better.

## 2019-06-11 ENCOUNTER — Other Ambulatory Visit: Payer: Self-pay

## 2019-06-11 ENCOUNTER — Encounter: Payer: Self-pay | Admitting: Family Medicine

## 2019-06-11 ENCOUNTER — Ambulatory Visit (INDEPENDENT_AMBULATORY_CARE_PROVIDER_SITE_OTHER): Payer: Self-pay | Admitting: Family Medicine

## 2019-06-11 DIAGNOSIS — I6990 Unspecified sequelae of unspecified cerebrovascular disease: Secondary | ICD-10-CM

## 2019-06-11 DIAGNOSIS — K209 Esophagitis, unspecified without bleeding: Secondary | ICD-10-CM

## 2019-06-11 DIAGNOSIS — E7800 Pure hypercholesterolemia, unspecified: Secondary | ICD-10-CM

## 2019-06-11 DIAGNOSIS — F339 Major depressive disorder, recurrent, unspecified: Secondary | ICD-10-CM

## 2019-06-11 DIAGNOSIS — R64 Cachexia: Secondary | ICD-10-CM

## 2019-06-11 MED ORDER — ATORVASTATIN CALCIUM 80 MG PO TABS: 80.0000 mg | ORAL_TABLET | Freq: Every day | ORAL | 3 refills | Status: DC

## 2019-06-11 MED ORDER — LORAZEPAM 0.5 MG PO TABS: 0.5000 mg | ORAL_TABLET | Freq: Two times a day (BID) | ORAL | 0 refills | Status: DC | PRN

## 2019-06-11 MED ORDER — FLUOXETINE HCL 20 MG PO TABS: 20.0000 mg | ORAL_TABLET | Freq: Every day | ORAL | 3 refills | Status: DC

## 2019-06-11 NOTE — Assessment & Plan Note (Signed)
Place back on statin.  Hold ASA until esophageal strictures are manageded.

## 2019-06-11 NOTE — Assessment & Plan Note (Signed)
Severe with stricture.  Long discussion about eating strategies.  Add prn maalox.

## 2019-06-11 NOTE — Assessment & Plan Note (Signed)
Slight improvement.  Still with moderate to severe protein calorie malnutrition.

## 2019-06-11 NOTE — Patient Instructions (Signed)
Darn it, [name] was right. Small frequent feeds. Posture is important. Very small bites, chew well.  You can try some maalox or mylanta with the protonix. See me in 1-2 weeks.

## 2019-06-11 NOTE — Progress Notes (Signed)
    SUBJECTIVE:   CHIEF COMPLAINT / HPI:   Esophageal stricture and more: 1. Hospitalized with erosive esophagitis and stricture, dilated to 40%.  Followed by GI.  Still marked difficulty swallowing. 2. Chronic moderate protein calorie malnutrition from dysphage.  Low weight was 134.  States still difficult to eat. 3. S/P CVA and known CVA.  Off statin and ASA. 4. Depression.  Has struggled with depresssion since his CVA.  No plan.  Does have loaded guns in the house.  Wife present during discussion and we decided the loaded guns were a bad idea.  She will manage.  Does not feel like he can talk to a counselor.  Sleeping "too much"  OBJECTIVE:   BP 124/74   Pulse 61   Ht 5\' 8"  (1.727 m)   Wt 138 lb 12.8 oz (63 kg)   SpO2 100%   BMI 21.10 kg/m   Well hydrated Lungs clear Cardiac RRR without m or g Abd epigastric tenderness.  ASSESSMENT/PLAN:   Cachexia (HCC) Slight improvement.  Still with moderate to severe protein calorie malnutrition.  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  No current suicide plans refuses counseling (he will talk to me.)  Willing to take meds. No previous Rx.  Felt better with benzo in hospital Start low dose SSRI.  Do not push dose due to GI side effects. Short term benzo x 4 weeks while SSRI has a chance to become effective. FU 1-2 weeks. Get rid of loaded guns from the house.  Late effects of CVA (cerebrovascular accident) Place back on statin.  Hold ASA until esophageal strictures are manageded.  Esophagitis Severe with stricture.  Long discussion about eating strategies.  Add prn maalox.     , MD [facility]

## 2019-06-11 NOTE — Assessment & Plan Note (Signed)
Severe, PHq9=23.  No current suicide plans refuses counseling (he will talk to me.)  Willing to take meds. No previous Rx.  Felt better with benzo in hospital Start low dose SSRI.  Do not push dose due to GI side effects. Short term benzo x 4 weeks while SSRI has a chance to become effective. FU 1-2 weeks. Get rid of loaded guns from the house.

## 2019-06-18 ENCOUNTER — Ambulatory Visit: Payer: Self-pay | Admitting: Family Medicine

## 2019-06-25 ENCOUNTER — Ambulatory Visit: Payer: Self-pay | Admitting: Family Medicine

## 2019-07-03 ENCOUNTER — Telehealth: Payer: Self-pay

## 2019-07-03 ENCOUNTER — Other Ambulatory Visit: Payer: Self-pay | Admitting: Family Medicine

## 2019-07-03 MED ORDER — OMEPRAZOLE 2 MG/ML ORAL SUSPENSION: 40.0000 mg | Freq: Every day | ORAL | 0 refills | Status: DC

## 2019-07-03 NOTE — Telephone Encounter (Signed)
Change protonix to omeprazole 20mb/mL, 40 mg BID bc pt having difficulty swallowing protonix capsules.

## 2019-07-03 NOTE — Telephone Encounter (Signed)
Patient's wife, [name], calls nurse line regarding concerns for patient's swallowing and weight loss. Wife reports that patient has lost 11 pounds since office visit on 06/11/19. States that he is still having extreme difficulty swallowing food and drink. Reports vital signs WNL.   Spoke with Dr. McDiarmid regarding patient. Advised that patient be brought into [facility] clinic on Tuesday Morning. Scheduled patient at 8:50. Strict ED precautions given.   Wife also reports that liquid protonix has worked the best in the past. States that patient has difficult time taking pills and keeping them down. If appropriate switch, please send to [facility] in [place], [place].  Routing to PCP and Dr. McDiarmid.   [name], RN

## 2019-07-03 NOTE — Telephone Encounter (Signed)
Pharmacist calls nurse line regarding the expense of the omeprazole solution. Called and spoke with family who was agreeable to trying capsule form and sprinkling in food/beverage.   Spoke with Dr. McDiarmid and received verbal okay to have pharmacist switch rx to capsule form.   Patient to follow up in clinic on Tuesday.   [name], RN

## 2019-07-03 NOTE — Progress Notes (Signed)
Changing to omeprazole liquid, 2 mg/mL, 40 mg twice daily oral  as patient having difficulty swallowing Protonix capsules

## 2019-07-07 ENCOUNTER — Ambulatory Visit: Payer: Self-pay

## 2019-07-07 NOTE — Telephone Encounter (Signed)
Noted and agree. 

## 2019-07-07 NOTE — Telephone Encounter (Signed)
Reviewed changes to omeprazole formulation and agree.

## 2019-07-10 ENCOUNTER — Emergency Department (HOSPITAL_COMMUNITY)
Admission: EM | Admit: 2019-07-10 | Discharge: 2019-07-10 | Disposition: A | Discharge status: Home / Self Care | Payer: Self-pay | Attending: Emergency Medicine | Admitting: Emergency Medicine

## 2019-07-10 ENCOUNTER — Encounter (HOSPITAL_COMMUNITY): Payer: Self-pay

## 2019-07-10 ENCOUNTER — Other Ambulatory Visit: Payer: Self-pay

## 2019-07-10 ENCOUNTER — Emergency Department (HOSPITAL_COMMUNITY): Payer: Self-pay

## 2019-07-10 DIAGNOSIS — R1319 Other dysphagia: Secondary | ICD-10-CM | POA: Insufficient documentation

## 2019-07-10 DIAGNOSIS — I252 Old myocardial infarction: Secondary | ICD-10-CM | POA: Insufficient documentation

## 2019-07-10 DIAGNOSIS — I10 Essential (primary) hypertension: Secondary | ICD-10-CM | POA: Insufficient documentation

## 2019-07-10 DIAGNOSIS — Z79899 Other long term (current) drug therapy: Secondary | ICD-10-CM | POA: Insufficient documentation

## 2019-07-10 DIAGNOSIS — I2510 Atherosclerotic heart disease of native coronary artery without angina pectoris: Secondary | ICD-10-CM | POA: Insufficient documentation

## 2019-07-10 DIAGNOSIS — Z8673 Personal history of transient ischemic attack (TIA), and cerebral infarction without residual deficits: Secondary | ICD-10-CM | POA: Insufficient documentation

## 2019-07-10 DIAGNOSIS — F17210 Nicotine dependence, cigarettes, uncomplicated: Secondary | ICD-10-CM | POA: Insufficient documentation

## 2019-07-10 LAB — CBC WITH DIFFERENTIAL/PLATELET
Abs Immature Granulocytes: 0.04 10*3/uL (ref 0.00–0.07)
Basophils Absolute: 0.1 10*3/uL (ref 0.0–0.1)
Basophils Relative: 1 %
Eosinophils Absolute: 0.3 10*3/uL (ref 0.0–0.5)
Eosinophils Relative: 3 %
HCT: 45.8 % (ref 39.0–52.0)
Hemoglobin: 15.7 g/dL (ref 13.0–17.0)
Immature Granulocytes: 0 %
Lymphocytes Relative: 19 %
Lymphs Abs: 2.1 10*3/uL (ref 0.7–4.0)
MCH: 32 pg (ref 26.0–34.0)
MCHC: 34.3 g/dL (ref 30.0–36.0)
MCV: 93.3 fL (ref 80.0–100.0)
Monocytes Absolute: 0.9 10*3/uL (ref 0.1–1.0)
Monocytes Relative: 8 %
Neutro Abs: 7.5 10*3/uL (ref 1.7–7.7)
Neutrophils Relative %: 69 %
Platelets: 250 10*3/uL (ref 150–400)
RBC: 4.91 MIL/uL (ref 4.22–5.81)
RDW: 14.6 % (ref 11.5–15.5)
WBC: 10.9 10*3/uL — ABNORMAL HIGH (ref 4.0–10.5)
nRBC: 0 % (ref 0.0–0.2)

## 2019-07-10 LAB — COMPREHENSIVE METABOLIC PANEL
ALT: 13 U/L (ref 0–44)
AST: 26 U/L (ref 15–41)
Albumin: 3.8 g/dL (ref 3.5–5.0)
Alkaline Phosphatase: 94 U/L (ref 38–126)
Anion gap: 12 (ref 5–15)
BUN: 6 mg/dL (ref 6–20)
CO2: 23 mmol/L (ref 22–32)
Calcium: 9.3 mg/dL (ref 8.9–10.3)
Chloride: 97 mmol/L — ABNORMAL LOW (ref 98–111)
Creatinine, Ser: 0.8 mg/dL (ref 0.61–1.24)
GFR calc Af Amer: >60 mL/min (ref >60)
GFR calc non Af Amer: >60 mL/min (ref >60)
Glucose, Bld: 85 mg/dL (ref 70–99)
Potassium: 5 mmol/L (ref 3.5–5.1)
Sodium: 132 mmol/L — ABNORMAL LOW (ref 135–145)
Total Bilirubin: 1 mg/dL (ref 0.3–1.2)
Total Protein: 7.2 g/dL (ref 6.5–8.1)

## 2019-07-10 MED ORDER — NICOTINE 14 MG/24HR TD PT24: 14.0000 mg | MEDICATED_PATCH | Freq: Every day | TRANSDERMAL | 0 refills | Status: DC

## 2019-07-10 MED ORDER — MORPHINE SULFATE (PF) 4 MG/ML IV SOLN
4.0000 mg | Freq: Once | INTRAVENOUS | Status: AC
  Administered 2019-07-10: 4 mg via INTRAVENOUS
  Filled 2019-07-10: qty 1

## 2019-07-10 MED ORDER — MORPHINE SULFATE 20 MG/5ML PO SOLN: 5.0000 mg | ORAL | 0 refills | Status: DC | PRN

## 2019-07-10 MED ORDER — SODIUM CHLORIDE 0.9 % IV BOLUS
1000.0000 mL | Freq: Once | INTRAVENOUS | Status: AC
  Administered 2019-07-10: 1000 mL via INTRAVENOUS

## 2019-07-10 NOTE — ED Notes (Signed)
Pt reports peaking doesn't hurt and he is able to breathe comfortably.

## 2019-07-10 NOTE — ED Triage Notes (Signed)
Pt reports that he has been unable to speak or swallow x3 days. He recently had his esophagus stretched 40% and symptoms have returned. Denies SOB. States that he has lost 11 lbs in the last week. Unable to tolerate water per wife.

## 2019-07-10 NOTE — ED Provider Notes (Signed)
[facility]-EMERGENCY DEPT Provider Note   CSN: [id number] Arrival date & time: 07/10/19  1834     History Chief Complaint  Patient presents with  . Sore Throat  . Unable to Swallow    Ralph [name] is a 54 y.o. male presents today with his wife for dysphagia and difficulty speaking.  History obtained with assistance of patient's wife at bedside.  Patient has a history of esophagitis, esophageal dysphagia mediastinal adenopathy, CVA, MI.  Patient has chronic difficulty swallowing due to mediastinal adenopathy, in May he had esophageal dilation performed by gastroenterology which temporarily relieved his symptoms.  Approximately 3 days after the dilation his symptoms returned and have gradually worsened.  He describes a tight severe sensation in his esophagus, constant worsened with swallowing radiating up into his throat.  He has been attempting a liquid diet without relief of symptoms.  Over the last few days symptoms have worsened, he is unable to tolerate any p.o., and his wife reports that patient has lost 11 pounds over the last 3 weeks.  Patient reports occasionally he is unable to swallow his spit because he will shortly after spit it back up.  Denies fever, chest pain, shortness of breath, difficulty breathing, abdominal pain, diarrhea, numbness/weakness, tingling or any additional concerns.  HPI     Past Medical History:  Diagnosis Date  . Coronary artery disease   . Myocardial infarction (HCC)   . Stroke [facility])     Patient Active Problem List   Diagnosis Date Noted  . Depression, recurrent (HCC) 06/11/2019  . Esophagitis 05/27/2019  . Dehydration 05/27/2019  . Bradycardia 05/04/2019  . Chest wall pain 05/04/2019  . Does not have health insurance   . 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 Esophageal mass 05/01/2019  . Dysphagia   . Weight loss   . Hyponatremia   . Cachexia (HCC)   . Tobacco use disorder   . Neuropathic pain 08/29/2017  . High  risk social situation 08/29/2017  . 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  . Pure hypercholesterolemia 11/26/2012  . Cerebrovascular disease 11/25/2012  . Peripheral vascular disease, unspecified (HCC) 11/25/2012  . Coronary artery disease 11/25/2012  . Tobacco abuse counseling 11/25/2012  . Essential hypertension, benign 11/25/2012    Past Surgical History:  Procedure Laterality Date  . APPENDECTOMY    . BALLOON DILATION N/A 06/01/2019   Procedure: BALLOON DILATION;  Surgeon: Vida Rigger, MD;  Location: [facility] ENDOSCOPY;  Service: Endoscopy;  Laterality: N/A;  . BIOPSY  04/29/2019   Procedure: BIOPSY;  Surgeon: Charlott Rakes, MD;  Location: [facility] ENDOSCOPY;  Service: Endoscopy;;  . BIOPSY  05/27/2019   Procedure: BIOPSY;  Surgeon: [name], MD;  Location: [facility] ENDOSCOPY;  Service: Endoscopy;;  . BIOPSY  06/01/2019   Procedure: BIOPSY;  Surgeon: Vida Rigger, MD;  Location: [facility] ENDOSCOPY;  Service: Endoscopy;;  . ESOPHAGOGASTRODUODENOSCOPY N/A 06/01/2019   Procedure: ESOPHAGOGASTRODUODENOSCOPY (EGD);  Surgeon: Vida Rigger, MD;  Location: [facility] ENDOSCOPY;  Service: Endoscopy;  Laterality: N/A;  . ESOPHAGOGASTRODUODENOSCOPY (EGD) WITH PROPOFOL N/A 04/29/2019   Procedure: ESOPHAGOGASTRODUODENOSCOPY (EGD) WITH PROPOFOL;  Surgeon: Charlott Rakes, MD;  Location: [facility] ENDOSCOPY;  Service: Endoscopy;  Laterality: N/A;  . ESOPHAGOGASTRODUODENOSCOPY (EGD) WITH PROPOFOL N/A 05/27/2019   Procedure: ESOPHAGOGASTRODUODENOSCOPY (EGD) WITH PROPOFOL;  Surgeon: [name], MD;  Location: [facility] ENDOSCOPY;  Service: Endoscopy;  Laterality: N/A;  . SHOULDER SURGERY         History reviewed. No pertinent family history.  Social History   Tobacco Use  .  Smoking status: Current Every Day Smoker    Packs/day: 1.50    Years: 20.00    Pack years: 30.00    Types: Cigarettes  . Smokeless tobacco: Never Used  Substance Use Topics  . Alcohol use: Yes    Alcohol/week: 1.0 standard drinks     Types: 1 Standard drinks or equivalent per week  . Drug use: Yes    Types: Cocaine    Home Medications Prior to Admission medications   Medication Sig Start Date End Date Taking? Authorizing Provider  atorvastatin (LIPITOR) 80 MG tablet Take 1 tablet (80 mg total) by mouth daily. 06/11/19   Moses Manners, MD  FLUoxetine (PROZAC) 20 MG tablet Take 1 tablet (20 mg total) by mouth daily. 06/11/19   Moses Manners, MD  LORazepam (ATIVAN) 0.5 MG tablet Take 1 tablet (0.5 mg total) by mouth 2 (two) times daily as needed for anxiety. 06/11/19   Moses Manners, MD  omeprazole (FIRST-OMEPRAZOLE) 2 mg/mL SUSP oral suspension Take 20 mLs (40 mg total) by mouth daily. 07/03/19   McDiarmid, Leighton Roach, MD    Allergies    Patient has no known allergies.  Review of Systems   Review of Systems Ten systems are reviewed and are negative for acute change except as noted in the HPI  Physical Exam Updated Vital Signs BP 128/75   Pulse (!) 50   Temp 98.7 F (37.1 C) (Oral)   Resp 18   SpO2 100%   Physical Exam Constitutional:      General: He is not in acute distress.    Appearance: Normal appearance. He is well-developed. He is not ill-appearing or diaphoretic.  HENT:     Head: Normocephalic and atraumatic.     Jaw: There is normal jaw occlusion. No trismus.     Comments: Posterior pharynx cobblestoning. The patient has normal phonation and is in control of secretions. No stridor.  Midline uvula without edema. Soft palate rises symmetrically. No tonsillar erythema, swelling or exudates. Tongue protrusion is normal, floor of mouth is soft. No trismus. No creptius on neck palpation. No gingival erythema or fluctuance noted. Mucus membranes moist. Eyes:     General: Vision grossly intact. Gaze aligned appropriately.     Pupils: Pupils are equal, round, and reactive to light.  Neck:     Trachea: Trachea and phonation normal. No tracheal tenderness or tracheal deviation.  Pulmonary:     Effort:  Pulmonary effort is normal. No respiratory distress.  Abdominal:     General: There is no distension.     Palpations: Abdomen is soft.     Tenderness: There is no abdominal tenderness. There is no guarding or rebound.  Musculoskeletal:        General: Normal range of motion.     Cervical back: Full passive range of motion without pain, normal range of motion and neck supple. No edema.  Skin:    General: Skin is warm and dry.  Neurological:     Mental Status: He is alert.     GCS: GCS eye subscore is 4. GCS verbal subscore is 5. GCS motor subscore is 6.     Comments: Speech is clear and goal oriented, follows commands Major Cranial nerves without deficit, no facial droop Moves extremities without ataxia, coordination intact  Psychiatric:        Behavior: Behavior normal.     ED Results / Procedures / Treatments   Labs (all labs ordered are listed, but only abnormal results  are displayed) Labs Reviewed  CBC WITH DIFFERENTIAL/PLATELET - Abnormal; Notable for the following components:      Result Value   WBC 10.9 (*)    All other components within normal limits  COMPREHENSIVE METABOLIC PANEL - Abnormal; Notable for the following components:   Sodium 132 (*)    Chloride 97 (*)    All other components within normal limits  SARS CORONAVIRUS 2 BY RT PCR (HOSPITAL ORDER, [facility])    EKG None  Radiology DG Chest Portable 1 View  Result Date: 07/10/2019 CLINICAL DATA:  Dysphagia EXAM: PORTABLE CHEST 1 VIEW COMPARISON:  04/28/2019 FINDINGS: Heart is normal size. No confluent airspace opacities or effusions. No acute bony abnormality. IMPRESSION: No active disease. Electronically Signed   By: Rolm Baptise M.D.   On: 07/10/2019 20:26    Procedures Procedures (including critical care time)  Medications Ordered in ED Medications  morphine 4 MG/ML injection 4 mg (4 mg Intravenous Given 07/10/19 1955)  sodium chloride 0.9 % bolus 1,000 mL (1,000 mLs  Intravenous New Bag/Given 07/10/19 2008)    ED Course  I have reviewed the triage vital signs and the nursing notes.  Pertinent labs & imaging results that were available during my care of the patient were reviewed by me and considered in my medical decision making (see chart for details).  Clinical Course as of Jul 09 2128  Fri Jul 10, 2019  2009 Dr. Paulita Fujita   [BM]    Clinical Course User Index [BM] Gari Crown   MDM Rules/Calculators/A&P                      Additional History Obtained: 1. Nursing notes from this visit. 2. Reviewed discharge summary from June 02, 2019.  Patient was diagnosed with moderate-severe esophagitis, dysphagia and distal esophageal stricture.  Patient was supported with TPN, Diflucan, PPI.  He was eventually transitioned to full liquid diet.  He was discharged with gastroenterology follow-up for endoscopy dilation 2-3 weeks. 3. Family wife at bedside. - Labs including CBC and CMP were ordered in addition to the chest x-ray.  Consult placed to gastroenterology for their input.  Normal saline ordered for suspected dehydration, 4 mg morphine ordered for pain control. - 8:09 PM: Discussed case with gastroenterologist Dr. Paulita Fujita advises obtaining basic labs, patient would not have any procedures until Monday, could potentially follow-up as outpatient. - CBC shows nonspecific leukocytosis of 10.9, no evidence of anemia. CMP shows mild hyponatremia and hypochloremia no emergent electrolyte derangement, evidence of acute kidney injury, emergent elevation of LFTs or anion gap.  Chest x-ray:  IMPRESSION:  No active disease.  I personally reviewed patient's chest x-ray and agree with radiologist interpretation. - Care handoff given to Dr. Langston Masker at shift change. Disposition per oncoming team.   Note: Portions of this report may have been transcribed using voice recognition software. Every effort was made to ensure accuracy; however, inadvertent  computerized transcription errors may still be present. Final Clinical Impression(s) / ED Diagnoses Final diagnoses:  Esophageal dysphagia    Rx / DC Orders ED Discharge Orders    None       Gari Crown 07/10/19 2141    Wyvonnia Dusky, MD 07/11/19 [phone]

## 2019-07-10 NOTE — Discharge Instructions (Addendum)
Please call Dr Hulen Shouts office on Monday.  Let them know you were seen in the ER, and Dr. Dulce Sellar recommended office follow up as soon as possible.

## 2019-07-10 NOTE — ED Notes (Signed)
Pt verbalizes understanding of DC instructions. Pt belongings returned and is ambulatory out of ED.  

## 2019-07-11 ENCOUNTER — Telehealth (HOSPITAL_COMMUNITY): Payer: Self-pay | Admitting: Emergency Medicine

## 2019-07-11 MED ORDER — MORPHINE SULFATE 20 MG/5ML PO SOLN: 5.0000 mg | ORAL | 0 refills | Status: DC | PRN

## 2019-07-11 NOTE — Telephone Encounter (Signed)
Previous pharmacy does not carry liquid morphine, will change to [facility]

## 2019-07-13 ENCOUNTER — Other Ambulatory Visit (HOSPITAL_COMMUNITY)
Admission: RE | Admit: 2019-07-13 | Discharge: 2019-07-13 | Disposition: A | Discharge status: Home / Self Care | Payer: HRSA Program | Source: Ambulatory Visit | Attending: Gastroenterology | Admitting: Gastroenterology

## 2019-07-13 ENCOUNTER — Other Ambulatory Visit: Payer: Self-pay | Admitting: Gastroenterology

## 2019-07-13 DIAGNOSIS — Z20822 Contact with and (suspected) exposure to covid-19: Secondary | ICD-10-CM | POA: Diagnosis not present

## 2019-07-13 DIAGNOSIS — Z01812 Encounter for preprocedural laboratory examination: Secondary | ICD-10-CM | POA: Insufficient documentation

## 2019-07-13 LAB — SARS CORONAVIRUS 2 (TAT 6-24 HRS): SARS Coronavirus 2: NEGATIVE

## 2019-07-15 ENCOUNTER — Encounter: Payer: Self-pay | Admitting: Family Medicine

## 2019-07-15 ENCOUNTER — Other Ambulatory Visit: Payer: Self-pay

## 2019-07-15 ENCOUNTER — Ambulatory Visit (INDEPENDENT_AMBULATORY_CARE_PROVIDER_SITE_OTHER): Payer: Self-pay | Admitting: Family Medicine

## 2019-07-15 DIAGNOSIS — F339 Major depressive disorder, recurrent, unspecified: Secondary | ICD-10-CM

## 2019-07-15 DIAGNOSIS — R1310 Dysphagia, unspecified: Secondary | ICD-10-CM

## 2019-07-15 DIAGNOSIS — R079 Chest pain, unspecified: Secondary | ICD-10-CM

## 2019-07-15 DIAGNOSIS — K228 Other specified diseases of esophagus: Secondary | ICD-10-CM

## 2019-07-15 DIAGNOSIS — R64 Cachexia: Secondary | ICD-10-CM

## 2019-07-15 DIAGNOSIS — K209 Esophagitis, unspecified without bleeding: Secondary | ICD-10-CM

## 2019-07-15 DIAGNOSIS — K2289 Other specified disease of esophagus: Secondary | ICD-10-CM

## 2019-07-15 DIAGNOSIS — R1319 Other dysphagia: Secondary | ICD-10-CM

## 2019-07-15 MED ORDER — TRAMADOL HCL 50 MG PO TABS: 50.0000 mg | ORAL_TABLET | Freq: Three times a day (TID) | ORAL | 0 refills | Status: AC

## 2019-07-15 MED ORDER — MIRTAZAPINE 15 MG PO TABS: 15.0000 mg | ORAL_TABLET | Freq: Every day | ORAL | 3 refills | Status: DC

## 2019-07-15 NOTE — Anesthesia Preprocedure Evaluation (Addendum)
Anesthesia Evaluation  Patient identified by MRN, date of birth, ID band Patient awake    Reviewed: Allergy & Precautions, NPO status , Patient's Chart, lab work & pertinent test results  Airway Mallampati: II  TM Distance: >3 FB Neck ROM: Full    Dental  (+) Poor Dentition, Missing, Loose, Chipped, Partial Lower, Partial Upper, Dental Advisory Given   Pulmonary Current Smoker and Patient abstained from smoking.,    Pulmonary exam normal breath sounds clear to auscultation       Cardiovascular hypertension, + CAD, + Past MI and + Peripheral Vascular Disease  Normal cardiovascular exam Rhythm:Regular Rate:Normal  Echo 04/2019 1. Normal LV systolic function; grade 1 diastolic dysfunction.  2. Left ventricular ejection fraction, by estimation, is 60 to 65%. The left ventricle has normal function. The left ventricle has no regional wall motion abnormalities. Left ventricular diastolic parameters are consistent with Grade I diastolic dysfunction (impaired relaxation).  3. Right ventricular systolic function is normal. The right ventricular size is normal.  4. The mitral valve is normal in structure. No evidence of mitral valve regurgitation. No evidence of mitral stenosis.  5. The aortic valve was not well visualized. Aortic valve regurgitation is not visualized. No aortic stenosis is present.  6. The inferior vena cava is normal in size with greater than 50% respiratory variability, suggesting right atrial pressure of 3 mmHg.    Neuro/Psych PSYCHIATRIC DISORDERS Depression CVA    GI/Hepatic Neg liver ROS, History noted CG   Endo/Other  negative endocrine ROS  Renal/GU negative Renal ROS     Musculoskeletal negative musculoskeletal ROS (+)   Abdominal   Peds  Hematology negative hematology ROS (+)   Anesthesia Other Findings   Reproductive/Obstetrics                            Anesthesia  Physical  Anesthesia Plan  ASA: III  Anesthesia Plan: MAC   Post-op Pain Management:    Induction: Intravenous  PONV Risk Score and Plan: 0 and Propofol infusion, TIVA and Treatment may vary due to age or medical condition  Airway Management Planned: Natural Airway  Additional Equipment:   Intra-op Plan:   Post-operative Plan:   Informed Consent: I have reviewed the patients History and Physical, chart, labs and discussed the procedure including the risks, benefits and alternatives for the proposed anesthesia with the patient or authorized representative who has indicated his/her understanding and acceptance.     Dental advisory given  Plan Discussed with: CRNA  Anesthesia Plan Comments:        Anesthesia Quick Evaluation

## 2019-07-15 NOTE — Patient Instructions (Signed)
I sent in a prescription for a new medication for depression - mirtazipine.   Call me early next week.  Let me know how you are doing after the procedure.

## 2019-07-16 ENCOUNTER — Encounter: Payer: Self-pay | Admitting: Family Medicine

## 2019-07-16 ENCOUNTER — Encounter (HOSPITAL_COMMUNITY)
Admission: RE | Disposition: A | Discharge status: Home / Self Care | Payer: Self-pay | Source: Home / Self Care | Attending: Gastroenterology

## 2019-07-16 ENCOUNTER — Ambulatory Visit (HOSPITAL_COMMUNITY): Payer: Self-pay | Admitting: Anesthesiology

## 2019-07-16 ENCOUNTER — Encounter (HOSPITAL_COMMUNITY): Payer: Self-pay | Admitting: Gastroenterology

## 2019-07-16 ENCOUNTER — Ambulatory Visit (HOSPITAL_COMMUNITY): Payer: Self-pay

## 2019-07-16 ENCOUNTER — Ambulatory Visit (HOSPITAL_COMMUNITY)
Admission: RE | Admit: 2019-07-16 | Discharge: 2019-07-16 | Disposition: A | Discharge status: Home / Self Care | Payer: Self-pay | Attending: Gastroenterology | Admitting: Gastroenterology

## 2019-07-16 ENCOUNTER — Telehealth: Payer: Self-pay

## 2019-07-16 DIAGNOSIS — I739 Peripheral vascular disease, unspecified: Secondary | ICD-10-CM | POA: Insufficient documentation

## 2019-07-16 DIAGNOSIS — K449 Diaphragmatic hernia without obstruction or gangrene: Secondary | ICD-10-CM | POA: Insufficient documentation

## 2019-07-16 DIAGNOSIS — K222 Esophageal obstruction: Secondary | ICD-10-CM | POA: Insufficient documentation

## 2019-07-16 DIAGNOSIS — F17200 Nicotine dependence, unspecified, uncomplicated: Secondary | ICD-10-CM | POA: Insufficient documentation

## 2019-07-16 DIAGNOSIS — X58XXXA Exposure to other specified factors, initial encounter: Secondary | ICD-10-CM | POA: Insufficient documentation

## 2019-07-16 DIAGNOSIS — I252 Old myocardial infarction: Secondary | ICD-10-CM | POA: Insufficient documentation

## 2019-07-16 DIAGNOSIS — I2510 Atherosclerotic heart disease of native coronary artery without angina pectoris: Secondary | ICD-10-CM | POA: Insufficient documentation

## 2019-07-16 DIAGNOSIS — Z8673 Personal history of transient ischemic attack (TIA), and cerebral infarction without residual deficits: Secondary | ICD-10-CM | POA: Insufficient documentation

## 2019-07-16 DIAGNOSIS — R1310 Dysphagia, unspecified: Secondary | ICD-10-CM | POA: Insufficient documentation

## 2019-07-16 DIAGNOSIS — T18198A Other foreign object in esophagus causing other injury, initial encounter: Secondary | ICD-10-CM | POA: Insufficient documentation

## 2019-07-16 DIAGNOSIS — T18128A Food in esophagus causing other injury, initial encounter: Secondary | ICD-10-CM | POA: Insufficient documentation

## 2019-07-16 DIAGNOSIS — K209 Esophagitis, unspecified without bleeding: Secondary | ICD-10-CM | POA: Insufficient documentation

## 2019-07-16 DIAGNOSIS — I10 Essential (primary) hypertension: Secondary | ICD-10-CM | POA: Insufficient documentation

## 2019-07-16 HISTORY — PX: BALLOON DILATION: SHX5330

## 2019-07-16 HISTORY — PX: ESOPHAGOGASTRODUODENOSCOPY (EGD) WITH PROPOFOL: SHX5813

## 2019-07-16 HISTORY — PX: BIOPSY: SHX5522

## 2019-07-16 HISTORY — PX: FOREIGN BODY REMOVAL: SHX962

## 2019-07-16 SURGERY — ESOPHAGOGASTRODUODENOSCOPY (EGD) WITH PROPOFOL
Anesthesia: General

## 2019-07-16 MED ORDER — PROPOFOL 500 MG/50ML IV EMUL
INTRAVENOUS | Status: DC | PRN
  Administered 2019-07-16: 125 ug/kg/min via INTRAVENOUS
  Administered 2019-07-16: 20 mg via INTRAVENOUS

## 2019-07-16 MED ORDER — SUCCINYLCHOLINE CHLORIDE 20 MG/ML IJ SOLN
INTRAMUSCULAR | Status: DC | PRN
  Administered 2019-07-16: 100 mg via INTRAVENOUS

## 2019-07-16 MED ORDER — ONDANSETRON HCL 4 MG/2ML IJ SOLN
INTRAMUSCULAR | Status: DC | PRN
  Administered 2019-07-16: 4 mg via INTRAVENOUS

## 2019-07-16 MED ORDER — SODIUM CHLORIDE 0.9 % IV SOLN
INTRAVENOUS | Status: DC | PRN
Start: 2019-07-16 — End: 2019-07-16

## 2019-07-16 MED ORDER — PROPOFOL 500 MG/50ML IV EMUL
INTRAVENOUS | Status: AC
  Filled 2019-07-16: qty 50

## 2019-07-16 MED ORDER — PROPOFOL 10 MG/ML IV BOLUS
INTRAVENOUS | Status: DC | PRN
  Administered 2019-07-16: 100 mg via INTRAVENOUS

## 2019-07-16 SURGICAL SUPPLY — 15 items

## 2019-07-16 NOTE — Anesthesia Procedure Notes (Signed)
Procedure Name: Intubation Date/Time: 07/16/2019 11:34 AM Performed by: Niel Hummer, CRNA Pre-anesthesia Checklist: Patient identified, Emergency Drugs available, Suction available and Patient being monitored Patient Re-evaluated:Patient Re-evaluated prior to induction Oxygen Delivery Method: Circle system utilized Preoxygenation: Pre-oxygenation with 100% oxygen Induction Type: IV induction, Rapid sequence and Cricoid Pressure applied Laryngoscope Size: Mac and 4 Grade View: Grade II Tube type: Oral Tube size: 7.5 mm Number of attempts: 1 Airway Equipment and Method: Stylet Placement Confirmation: ETT inserted through vocal cords under direct vision,  positive ETCO2 and breath sounds checked- equal and bilateral Secured at: 22 cm Tube secured with: Tape Dental Injury: Teeth and Oropharynx as per pre-operative assessment

## 2019-07-16 NOTE — Op Note (Addendum)
[facility] Patient Name: Ralph [name] Procedure Date: 07/16/2019 MRN: [id number] Attending MD: Vida Rigger , MD Date of Birth: 10-Sep-1965 CSN: [id number] Age: 54 Admit Type: Outpatient Procedure:                Upper GI endoscopy Indications:              Dysphagia, Foreign body in the esophagus, Stenosis                            of the esophagus, Follow-up of esophageal stenosis,                            For therapy of esophageal stenosis Providers:                Vida Rigger, MD, Tillie Fantasia, RN, Lawson Radar,                            Technician, Yevonne Pax, CRNA Referring MD:              Medicines:                Monitored Anesthesia Care initially we proceeded                            with propofol but anesthesia elected to intubate                            once the length of procedure and amount of debris                            in the esophagus was noted Complications:            No immediate complications. Estimated Blood Loss:     Estimated blood loss was minimal. Procedure:                Pre-Anesthesia Assessment:                           - Prior to the procedure, a History and Physical                            was performed, and patient medications and                            allergies were reviewed. The patient's tolerance of                            previous anesthesia was also reviewed. The risks                            and benefits of the procedure and the sedation                            options and risks were discussed with the patient.  All questions were answered, and informed consent                            was obtained. Prior Anticoagulants: The patient has                            taken no previous anticoagulant or antiplatelet                            agents. ASA Grade Assessment: II - A patient with                            mild systemic disease. After reviewing the risks                             and benefits, the patient was deemed in                            satisfactory condition to undergo the procedure.                           After obtaining informed consent, the endoscope was                            passed under direct vision. Throughout the                            procedure, the patient's blood pressure, pulse, and                            oxygen saturations were monitored continuously. The                            GIF-H190 ([id number]) Olympus gastroscope was                            introduced through the mouth, and advanced to the                            second part of duodenum. The upper GI endoscopy was                            accomplished without difficulty. The patient                            tolerated the procedure well. Scope In: Scope Out: Findings:      Increase debris.      and pills were found in the entire esophagus. Removal of some food and       all 3 pills was accomplished using the Jabier Mutton net twice      One extrinsic severe stenosis was found. The stenosis was traversed       after dilation. A TTS dilator was passed through the scope. Dilation       with a 6-7-8 mm balloon  dilator and then the 8-10 and finally the 10-12       was performed to 11 mm under fluoroscopic guidance in passing the first       2 balloons just to be sure. After dilation again the area seemed to have       an extrinsic compression impression just above the hiatal hernia at the       level of the stricture and biopsies were taken with a cold forceps for       histology.      A small hiatal hernia was present.      The entire examined stomach was normal.      The duodenal bulb, first portion of the duodenum and second portion of       the duodenum were normal.      The exam was otherwise without abnormality. Impression:               - Increase debris.                           - Food and pills in the esophagus. Removal was                             successful.                           - Extrinsic narrowing of the esophagus. Dilated.                            Biopsied.                           - Small hiatal hernia.                           - Normal stomach.                           - Normal duodenal bulb, first portion of the                            duodenum and second portion of the duodenum.                           - The examination was otherwise normal. Moderate Sedation:      Not Applicable - Patient had care per Anesthesia. Recommendation:           - Patient has a contact number available for                            emergencies. The signs and symptoms of potential                            delayed complications were discussed with the                            patient. Return to normal activities tomorrow.  Written discharge instructions were provided to the                            patient.                           - Clear liquid diet for 6 hours.                           - Continue present medications.                           - Await pathology results.                           - Return to GI clinic PRN. Consider repeat CT next                            versus EUS attempts possibly after dilation at the                            same time                           - Telephone GI clinic for pathology results in 5                            days.                           - Telephone GI clinic if symptomatic PRN. Procedure Code(s):        --- Professional ---                           [phone], Esophagogastroduodenoscopy, flexible,                            transoral; with removal of foreign body(s)                           43249, Esophagogastroduodenoscopy, flexible,                            transoral; with transendoscopic balloon dilation of                            esophagus (less than 30 mm diameter)                           43239, 59,  Esophagogastroduodenoscopy, flexible,                            transoral; with biopsy, single or multiple Diagnosis Code(s):        --- Professional ---                           B14.782NT18.128A, Food in esophagus causing  other injury,                            initial encounter                           K22.2, Esophageal obstruction                           K44.9, Diaphragmatic hernia without obstruction or                            gangrene                           R13.10, Dysphagia, unspecified                           T18.108A, Unspecified foreign body in esophagus                            causing other injury, initial encounter CPT copyright 2019 [employer]. All rights reserved. The codes documented in this report are preliminary and upon coder review may  be revised to meet current compliance requirements. Vida Rigger, MD 07/16/2019 12:32:23 PM This report has been signed electronically. Number of Addenda: 0

## 2019-07-16 NOTE — Assessment & Plan Note (Signed)
I am aware of the endo findings as I type this note.  Also spoke with wife.  Will proceed with repeat Chest CT based on concern for extrinsic mass and CT of 05/01/19 with subtle but suspicious findings.

## 2019-07-16 NOTE — Progress Notes (Signed)
Ralph [name] 10:54 AM  Subjective: Patient seen and examined and case thoroughly discussed with his significant other we answered all of their questions and his recent ER visit reviewed and he continues to have weight loss swallowing problems and pain in his esophagus  Objective: Vital signs stable afebrile no acute distress exam please see preassessment evaluation ER labs and chest x-ray reviewed  Assessment: Esophageal stricture questionable concerns for submucosal tumor  Plan: Okay to proceed with EGD and repeat dilation with further work-up and plans like repeat CT pending those findings we briefly discussed a possible esophageal stent in the future  [facility] E  office [phone] After 5PM or if no answer call [phone]

## 2019-07-16 NOTE — Discharge Instructions (Addendum)
Clear liquids only until 6 PM and then may have full liquids or pured foods and drink plenty of liquids to wash the food down and stay upright for 1- 2 hours after eating and we will probably proceed with repeat CT scan after biopsies returned and decide repeat endoscopy dilation or stent in the near future.      YOU HAD AN ENDOSCOPIC PROCEDURE TODAY: Refer to the procedure report and other information in the discharge instructions given to you for any specific questions about what was found during the examination. If this information does not answer your questions, please call [facility] office at [phone] to clarify.   YOU SHOULD EXPECT: Some feelings of bloating in the abdomen. Passage of more gas than usual. Walking can help get rid of the air that was put into your GI tract during the procedure and reduce the bloating. If you had a lower endoscopy (such as a colonoscopy or flexible sigmoidoscopy) you may notice spotting of blood in your stool or on the toilet paper. Some abdominal soreness may be present for a day or two, also.  DIET: See above.  ACTIVITY: Your care partner should take you home directly after the procedure. You should plan to take it easy, moving slowly for the rest of the day. You can resume normal activity the day after the procedure however YOU SHOULD NOT DRIVE, use power tools, machinery or perform tasks that involve climbing or major physical exertion for 24 hours (because of the sedation medicines used during the test).   SYMPTOMS TO REPORT IMMEDIATELY: A gastroenterologist can be reached at any hour. Please call [phone]  for any of the following symptoms:   . Following upper endoscopy (EGD, EUS, ERCP, esophageal dilation) Vomiting of blood or coffee ground material  New, significant abdominal pain  New, significant chest pain or pain under the shoulder blades  Painful or persistently difficult swallowing  New shortness of breath  Black, tarry-looking or red,  bloody stools  FOLLOW UP:  If any biopsies were taken you will be contacted by phone or by letter within the next 1-3 weeks. Call [phone]  if you have not heard about the biopsies in 3 weeks.  Please also call with any specific questions about appointments or follow up tests.

## 2019-07-16 NOTE — Anesthesia Procedure Notes (Signed)
Procedure Name: MAC Date/Time: 07/16/2019 11:20 AM Performed by: Niel Hummer, CRNA Pre-anesthesia Checklist: Emergency Drugs available, Patient identified, Suction available and Patient being monitored Oxygen Delivery Method: Simple face mask

## 2019-07-16 NOTE — Assessment & Plan Note (Signed)
For now, I conceptualize as chronic, non cancer pain.  Started tramadol.  I will escalate to narcotics if he proves to be cancer related pain.

## 2019-07-16 NOTE — Transfer of Care (Signed)
Immediate Anesthesia Transfer of Care Note  Patient: Ralph [name]  Procedure(s) Performed: ESOPHAGOGASTRODUODENOSCOPY (EGD) WITH PROPOFOL (N/A ) BALLOON DILATION (N/A ) FOREIGN BODY REMOVAL BIOPSY  Patient Location: PACU  Anesthesia Type:General  Level of Consciousness: awake  Airway & Oxygen Therapy: Patient Spontanous Breathing and Patient connected to face mask oxygen  Post-op Assessment: Report given to RN, Post -op Vital signs reviewed and stable and Patient moving all extremities X 4  Post vital signs: Reviewed and stable  Last Vitals:  Vitals Value Taken Time  BP    Temp    Pulse    Resp    SpO2      Last Pain:  Vitals:   07/16/19 1041  TempSrc: Oral  PainSc: 0-No pain         Complications: No complications documented.

## 2019-07-16 NOTE — Assessment & Plan Note (Signed)
Worse.  Scheduled for esophageal dilation.  Hopefully, PO intake will improve post dilation.  If not, consider PEG.

## 2019-07-16 NOTE — Telephone Encounter (Signed)
Called wife.  Reviewed findings on endo.  Concern for extrinsic mass.  Reviewed CT of chest and abd from March 2021.  Subtle but suspicious findings noted.  Will repeat CT of Chest to see if growth  Although this may turn into cancer related pain, I continue to conceptualize him as chronic non cancer pain.  As such, I will not add narcotics beyond tramadol to his pain regimen.

## 2019-07-16 NOTE — Progress Notes (Signed)
    SUBJECTIVE:   CHIEF COMPLAINT / HPI:   FU wt loss and dysphagia. Ralph [name] continues on his PPI.  Very poor PO intake.  Has EGD planned for 6/10 (tomorrow).   Depression unchanged.  Could not tolertate prozac side effects.  No SI or HI - he just wants to feel better.  Willing to try another antidepressant. C/O severe pain.  At present, I conceptualize as chronic, non cancer pain.   Wt loss - reviewed labs - surprisingly albumin is still OK.       OBJECTIVE:   BP 118/72   Pulse 64   Ht 5\' 8"  (1.727 m)   Wt 124 lb 12.8 oz (56.6 kg)   SpO2 99%   BMI 18.98 kg/m   Wt loss noted.  Gen appearance shows chronic malnutrition Lungs clear Cardiac RRR without m or g Abd no mass.  ASSESSMENT/PLAN:   Esophageal mass I am aware of the endo findings as I type this note.  Also spoke with wife.  Will proceed with repeat Chest CT based on concern for extrinsic mass and CT of 05/01/19 with subtle but suspicious findings.    Cachexia (HCC) Worse.  Scheduled for esophageal dilation.  Hopefully, PO intake will improve post dilation.  If not, consider PEG.  Depression, recurrent (HCC) Unchanged.  Start mirtazapine.  Best depression treatment would be to improve his medical condition.  Dysphagia Chronic and not improved.  Await esophageal dilation tomorrow.  Chest pain, unspecified For now, I conceptualize as chronic, non cancer pain.  Started tramadol.  I will escalate to narcotics if he proves to be cancer related pain.     05/03/19, MD [facility]

## 2019-07-16 NOTE — Telephone Encounter (Signed)
Patient's significant other, Donvea, calls nurse line regarding pain management. Ermalinda Barrios is very upset and expresses frustrations and disappointment with how Mr. Lyn's pain is being handled. Reports that patient was seen in the hospital today for endoscopy and that they were not able to prescribe him any pain medication due to being "red flagged" by Dr. Leveda Anna. Ermalinda Barrios states that she will be finding another primary doctor because this treatment is "ridiculous".   Forwarding to PCP  [name], RN

## 2019-07-16 NOTE — Assessment & Plan Note (Signed)
Unchanged.  Start mirtazapine.  Best depression treatment would be to improve his medical condition.

## 2019-07-16 NOTE — Assessment & Plan Note (Signed)
Chronic and not improved.  Await esophageal dilation tomorrow.

## 2019-07-17 ENCOUNTER — Encounter (HOSPITAL_COMMUNITY): Payer: Self-pay | Admitting: Gastroenterology

## 2019-07-17 ENCOUNTER — Telehealth: Payer: Self-pay | Admitting: Family Medicine

## 2019-07-17 NOTE — Telephone Encounter (Signed)
Called and LM that biopsy did not show cancer.  I still want the CT chest.

## 2019-07-20 ENCOUNTER — Other Ambulatory Visit: Payer: Self-pay

## 2019-07-20 LAB — SURGICAL PATHOLOGY

## 2019-07-27 NOTE — Anesthesia Postprocedure Evaluation (Signed)
Anesthesia Post Note  Patient: Ralph [name]  Procedure(s) Performed: ESOPHAGOGASTRODUODENOSCOPY (EGD) WITH PROPOFOL (N/A ) BALLOON DILATION (N/A ) FOREIGN BODY REMOVAL BIOPSY     Patient location during evaluation: PACU Anesthesia Type: General Level of consciousness: sedated and patient cooperative Pain management: pain level controlled Vital Signs Assessment: post-procedure vital signs reviewed and stable Respiratory status: spontaneous breathing Cardiovascular status: stable Anesthetic complications: no   No complications documented.  Last Vitals:  Vitals:   07/16/19 1310 07/16/19 1320  BP: (!) 162/84 (!) 145/83  Pulse: (!) 52 (!) 42  Resp: 15 12  Temp:    SpO2: 97% 97%    Last Pain:  Vitals:   07/16/19 1320  TempSrc:   PainSc: 0-No pain                 Lewie Loron

## 2019-07-28 ENCOUNTER — Other Ambulatory Visit: Payer: Self-pay | Admitting: Gastroenterology

## 2019-07-28 DIAGNOSIS — R9389 Abnormal findings on diagnostic imaging of other specified body structures: Secondary | ICD-10-CM

## 2019-07-31 ENCOUNTER — Other Ambulatory Visit: Payer: Self-pay | Admitting: Gastroenterology

## 2019-07-31 DIAGNOSIS — R9389 Abnormal findings on diagnostic imaging of other specified body structures: Secondary | ICD-10-CM

## 2019-07-31 DIAGNOSIS — K2289 Other specified disease of esophagus: Secondary | ICD-10-CM

## 2019-08-12 ENCOUNTER — Other Ambulatory Visit: Payer: Self-pay

## 2019-08-17 ENCOUNTER — Telehealth: Payer: Self-pay

## 2019-08-17 NOTE — Telephone Encounter (Signed)
I called and spoke to sig other, Ralph [name].  Ralph [name] has: 1. Apparently a biopsy showing "throat cancer."  I cannot see any biopsy results. 2. Severe pain and swallowing dysfunction. 3. Very poor PO intake x 3 days.  "He is down to 101 lbs."    Assuming he wants continued attempt at curative care, I advised him to go to the ER.  He will likely need admitted.  I believe he will need a gastrostomy feeding tube to provided better, reliable nutrition as he goes through the cancer WU and treatment process.    Also told Ralph [name] that Ralph [name] could choose comfort care.  Although he is on 59, he is S/P CVA and is now dealing with an important cancer.  I will be happy to refer him to hospice.    We left it that Ralph [name] would take him to the ER.  If needs admission, should be admitted by FPTS.  We should continue the goals of care discussion.

## 2019-08-17 NOTE — Telephone Encounter (Signed)
Patient's significant other, [name],  calls nurse line requesting returned call from Dr. Leveda Anna. [name] reports that patient has been recently diagnosed with throat cancer and is getting worse. She also reports that Mr. Lawrance has been vomiting over the last few days. Advised increased fluid intake and to advance diet as tolerated.   [name] requests returned call from provider at [phone]  To PCP  [name], RN

## 2019-08-24 ENCOUNTER — Telehealth: Payer: Self-pay

## 2019-08-24 NOTE — Telephone Encounter (Signed)
Called and spoke to SO.  Three major themes. Ralph [name] will be seen at cancer center tomorrow.  He has CT of chest, abd and pelvis scheduled. They are currently separated.  He needs to tell me again that I can share info with [name]. Suicidal? Has a loaded gun.  Gave away his guitar and lap top (beloved items.)  Currently smoking and drinking to numb the pain.  Likely has a terminal diagnosis.  HIGH RISK SUICIDE.  My message to [name] to convey to [name] was that no need for suicide.  He always has the option to choose either continued usual (aggressive) medical care or comfort care.  I will be happy to set him up with hospice for comfort care if that is what he chooses.  No need suicide.

## 2019-08-24 NOTE — Telephone Encounter (Signed)
Patient's significant other, [name], calls and LVM on nurse line regarding receiving call from Dr. Leveda Anna regarding Mr. Pe. States that patient has an appointment at the cancer center tomorrow and needs to speak to Dr. Leveda Anna. Attempted to call patient to gather additional information. No answer, no ability to LVM.   To PCP  [name], RN

## 2019-08-24 NOTE — Telephone Encounter (Signed)
Ralph [name] calls nurse line,a bit frantic, stating she missed PCP call because she was upstairs. Ralph [name] is requesting a call back right now. Advised to keep her phone on her to avoid another missed call. Please advise.  [phone].

## 2019-08-25 ENCOUNTER — Ambulatory Visit
Admission: RE | Admit: 2019-08-25 | Discharge: 2019-08-25 | Disposition: A | Discharge status: Home / Self Care | Payer: Self-pay | Source: Ambulatory Visit | Attending: Gastroenterology | Admitting: Gastroenterology

## 2019-08-25 ENCOUNTER — Telehealth: Payer: Self-pay | Admitting: Family Medicine

## 2019-08-25 ENCOUNTER — Other Ambulatory Visit: Payer: Self-pay

## 2019-08-25 DIAGNOSIS — R9389 Abnormal findings on diagnostic imaging of other specified body structures: Secondary | ICD-10-CM

## 2019-08-25 DIAGNOSIS — K2289 Other specified disease of esophagus: Secondary | ICD-10-CM

## 2019-08-25 MED ORDER — IOPAMIDOL (ISOVUE-300) INJECTION 61%
100.0000 mL | Freq: Once | INTRAVENOUS | Status: AC | PRN
  Administered 2019-08-25: 100 mL via INTRAVENOUS

## 2019-08-25 NOTE — Telephone Encounter (Signed)
[name] is dropping off a letter concerning the patient for Dr. Leveda Anna. She said he is aware she was bringing it. It is information from [facility] imaging. I have placed the letter in Dr. Cyndia Skeeters box.

## 2019-08-27 ENCOUNTER — Telehealth: Payer: Self-pay

## 2019-08-27 NOTE — Telephone Encounter (Signed)
Patients significant other LVM on nurse line requesting an urgent call back from PCP. [name] reports they have received his CT results back and she would like to discuss with PCP.

## 2019-08-28 NOTE — Telephone Encounter (Signed)
Two very important issues. 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  He has told Ralph [name] that he has both throat cancer and stomach cancer.  I can see the CT results, which do not show any obvious mets or widespread disease.  I told wife that I would assume that we have caught this early and that his condition is treatable. Psych/concern for suicide.  See my previous note.  Ralph [name] still has a loaded gun.  He has discharged the gun - not at anyone.  Per the wife, he has said some "crazy" things.  He has never made threats against others.  I expressed my concern about suicide potential.  Wife said that she was also very concerned.  I told her that she has two options. Convince Ralph [name] to go to the ER and then tell ER staff about the loaded gun and her concerns regarding suicide. Go to the magistrate and explain the suicide risk so that police can take in to Psych ER to see if meets IVC criteria.   Wife states she will proceed with some action re suicide possibility.

## 2019-08-28 NOTE — Telephone Encounter (Signed)
[name] calling nurse line again to speak with [name] "urgently." Please advise.

## 2019-08-28 NOTE — Telephone Encounter (Signed)
I have received her note

## 2019-08-31 ENCOUNTER — Other Ambulatory Visit: Payer: Self-pay | Admitting: Gastroenterology

## 2019-09-02 ENCOUNTER — Telehealth: Payer: Self-pay | Admitting: Family Medicine

## 2019-09-02 NOTE — Telephone Encounter (Signed)
Patient's significant other, [name], LVM on nurse line requesting returned call from Dr. Leveda Anna. [name] reports concerns for patient safety as there is a loaded gun in the house. States that she spoke with Dr. Leveda Anna last week regarding IVC process. States that she does not believe that he is actively suicidal or will hurt anyone else. Will reach out to behavioral health for further advice.   To PCP  [name], RN

## 2019-09-02 NOTE — Telephone Encounter (Signed)
Patient called before lunch stating he had left his wife and she was not happy about it, so she told him she was calling Doctor to tell him, that patient was suicidal. Patient requested no one speak or tell wife about anything. He said that wife is trying to get him arrested for nothing. He said she has an appointment with doctor [name] on the 4th and hopes that doctor [name] can help her, that she is on drugs and drinking really bad. Thanks  Patient also called right after lunch and asked for Dr. Leveda Anna to please call him. Thanks

## 2019-09-02 NOTE — Telephone Encounter (Signed)
Returned call to [employer]. Provided resources for contacting magistrate or police department, if patient is perceived to be a danger to himself or others. [name] states that she is going to Production assistant, radio for patient safety.   [name], RN

## 2019-09-07 NOTE — Telephone Encounter (Signed)
Called and LM that I would not talk to [facility] about him.  I will try calling again later to check more on his health.

## 2019-09-08 ENCOUNTER — Other Ambulatory Visit (HOSPITAL_COMMUNITY)
Admission: RE | Admit: 2019-09-08 | Discharge: 2019-09-08 | Disposition: A | Discharge status: Home / Self Care | Payer: HRSA Program | Source: Ambulatory Visit | Attending: Gastroenterology | Admitting: Gastroenterology

## 2019-09-08 DIAGNOSIS — Z20822 Contact with and (suspected) exposure to covid-19: Secondary | ICD-10-CM | POA: Insufficient documentation

## 2019-09-08 DIAGNOSIS — Z01812 Encounter for preprocedural laboratory examination: Secondary | ICD-10-CM | POA: Insufficient documentation

## 2019-09-08 LAB — SARS CORONAVIRUS 2 (TAT 6-24 HRS): SARS Coronavirus 2: NEGATIVE

## 2019-09-08 NOTE — Progress Notes (Addendum)
Pre op call done for endo procedure 09/09/19. Patient states he got his covid test this morning and will quarantine until procedure. Patient to be NPO after midnight, and confirms has a ride home post procedure. All questions addressed.

## 2019-09-08 NOTE — Telephone Encounter (Signed)
Spoke with Mellody Dance.  Good news is that he feels some better. He specifically denies suicidal thoughts.  I reiterated to him that by my read of his test results, all his conditions are treatable.  He does not want me to give any further information to his wife.  They are separated.  He has an appointment to see me on 8/8

## 2019-09-09 ENCOUNTER — Encounter (HOSPITAL_COMMUNITY)
Admission: RE | Disposition: A | Discharge status: Home / Self Care | Payer: Self-pay | Source: Home / Self Care | Attending: Gastroenterology

## 2019-09-09 ENCOUNTER — Ambulatory Visit (HOSPITAL_COMMUNITY)
Admission: RE | Admit: 2019-09-09 | Discharge: 2019-09-09 | Disposition: A | Discharge status: Home / Self Care | Payer: Self-pay | Attending: Gastroenterology | Admitting: Gastroenterology

## 2019-09-09 ENCOUNTER — Ambulatory Visit (HOSPITAL_COMMUNITY): Payer: Self-pay | Admitting: Anesthesiology

## 2019-09-09 ENCOUNTER — Encounter (HOSPITAL_COMMUNITY): Payer: Self-pay | Admitting: Gastroenterology

## 2019-09-09 ENCOUNTER — Other Ambulatory Visit: Payer: Self-pay

## 2019-09-09 DIAGNOSIS — K2100 Gastro-esophageal reflux disease with esophagitis, without bleeding: Secondary | ICD-10-CM | POA: Insufficient documentation

## 2019-09-09 DIAGNOSIS — K228 Other specified diseases of esophagus: Secondary | ICD-10-CM | POA: Insufficient documentation

## 2019-09-09 DIAGNOSIS — F17210 Nicotine dependence, cigarettes, uncomplicated: Secondary | ICD-10-CM | POA: Insufficient documentation

## 2019-09-09 DIAGNOSIS — I2510 Atherosclerotic heart disease of native coronary artery without angina pectoris: Secondary | ICD-10-CM | POA: Insufficient documentation

## 2019-09-09 DIAGNOSIS — Z8673 Personal history of transient ischemic attack (TIA), and cerebral infarction without residual deficits: Secondary | ICD-10-CM | POA: Insufficient documentation

## 2019-09-09 DIAGNOSIS — K222 Esophageal obstruction: Secondary | ICD-10-CM | POA: Insufficient documentation

## 2019-09-09 DIAGNOSIS — I739 Peripheral vascular disease, unspecified: Secondary | ICD-10-CM | POA: Insufficient documentation

## 2019-09-09 DIAGNOSIS — I10 Essential (primary) hypertension: Secondary | ICD-10-CM | POA: Insufficient documentation

## 2019-09-09 DIAGNOSIS — R1310 Dysphagia, unspecified: Secondary | ICD-10-CM | POA: Insufficient documentation

## 2019-09-09 DIAGNOSIS — I252 Old myocardial infarction: Secondary | ICD-10-CM | POA: Insufficient documentation

## 2019-09-09 DIAGNOSIS — K449 Diaphragmatic hernia without obstruction or gangrene: Secondary | ICD-10-CM | POA: Insufficient documentation

## 2019-09-09 DIAGNOSIS — R9389 Abnormal findings on diagnostic imaging of other specified body structures: Secondary | ICD-10-CM | POA: Insufficient documentation

## 2019-09-09 HISTORY — PX: UPPER ESOPHAGEAL ENDOSCOPIC ULTRASOUND (EUS): SHX6562

## 2019-09-09 HISTORY — PX: ESOPHAGOGASTRODUODENOSCOPY (EGD) WITH PROPOFOL: SHX5813

## 2019-09-09 HISTORY — PX: BALLOON DILATION: SHX5330

## 2019-09-09 HISTORY — PX: FINE NEEDLE ASPIRATION: SHX5430

## 2019-09-09 SURGERY — ESOPHAGOGASTRODUODENOSCOPY (EGD) WITH PROPOFOL
Anesthesia: Monitor Anesthesia Care

## 2019-09-09 MED ORDER — ONDANSETRON HCL 4 MG/2ML IJ SOLN
INTRAMUSCULAR | Status: DC | PRN
Start: 2019-09-09 — End: 2019-09-09
  Administered 2019-09-09: 4 mg via INTRAVENOUS

## 2019-09-09 MED ORDER — PROPOFOL 500 MG/50ML IV EMUL
INTRAVENOUS | Status: DC | PRN
  Administered 2019-09-09: 125 ug/kg/min via INTRAVENOUS

## 2019-09-09 MED ORDER — LIDOCAINE HCL 1 % IJ SOLN
INTRAMUSCULAR | Status: DC | PRN
  Administered 2019-09-09: 40 mg via INTRADERMAL

## 2019-09-09 MED ORDER — HYDRALAZINE HCL 20 MG/ML IJ SOLN
INTRAMUSCULAR | Status: DC | PRN
  Administered 2019-09-09: 2.5 mg via INTRAVENOUS

## 2019-09-09 MED ORDER — LACTATED RINGERS IV SOLN: INTRAVENOUS | Status: DC

## 2019-09-09 MED ORDER — PROPOFOL 10 MG/ML IV BOLUS
INTRAVENOUS | Status: AC
  Filled 2019-09-09: qty 20

## 2019-09-09 MED ORDER — PROPOFOL 10 MG/ML IV BOLUS
INTRAVENOUS | Status: DC | PRN
  Administered 2019-09-09 (×9): 20 mg via INTRAVENOUS

## 2019-09-09 SURGICAL SUPPLY — 15 items

## 2019-09-09 NOTE — Discharge Instructions (Signed)

## 2019-09-09 NOTE — Progress Notes (Signed)
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

## 2019-09-09 NOTE — Transfer of Care (Signed)
Immediate Anesthesia Transfer of Care Note  Patient: Ralph [name]  Procedure(s) Performed: ESOPHAGOGASTRODUODENOSCOPY (EGD) WITH PROPOFOL (N/A ) BALLOON DILATION (N/A ) FINE NEEDLE ASPIRATION (FNA) LINEAR UPPER ESOPHAGEAL ENDOSCOPIC ULTRASOUND (EUS) (N/A )  Patient Location: PACU and Endoscopy Unit  Anesthesia Type:MAC  Level of Consciousness: oriented, drowsy and patient cooperative  Airway & Oxygen Therapy: Patient Spontanous Breathing and Patient connected to face mask oxygen  Post-op Assessment: Report given to RN and Post -op Vital signs reviewed and stable  Post vital signs: Reviewed and stable  Last Vitals:  Vitals Value Taken Time  BP    Temp    Pulse 61 09/09/19 1229  Resp 20 09/09/19 1229  SpO2 100 % 09/09/19 1229  Vitals shown include unvalidated device data.  Last Pain:  Vitals:   09/09/19 0939  TempSrc: Oral  PainSc: 10-Worst pain ever         Complications: No complications documented.

## 2019-09-09 NOTE — Anesthesia Postprocedure Evaluation (Signed)
Anesthesia Post Note  Patient: Ralph [name]  Procedure(s) Performed: ESOPHAGOGASTRODUODENOSCOPY (EGD) WITH PROPOFOL (N/A ) BALLOON DILATION (N/A ) FINE NEEDLE ASPIRATION (FNA) LINEAR UPPER ESOPHAGEAL ENDOSCOPIC ULTRASOUND (EUS) (N/A )     Patient location during evaluation: PACU Anesthesia Type: MAC Level of consciousness: awake and alert Pain management: pain level controlled Vital Signs Assessment: post-procedure vital signs reviewed and stable Respiratory status: spontaneous breathing, nonlabored ventilation, respiratory function stable and patient connected to nasal cannula oxygen Cardiovascular status: stable and blood pressure returned to baseline Postop Assessment: no apparent nausea or vomiting Anesthetic complications: no   No complications documented.  Last Vitals:  Vitals:   09/09/19 1230 09/09/19 1240  BP: (!) 154/85 (!) 149/78  Pulse: 61 77  Resp: 20 16  Temp:    SpO2: 100% 100%    Last Pain:  Vitals:   09/09/19 1240  TempSrc:   PainSc: 0-No pain                 Effie Berkshire

## 2019-09-09 NOTE — Op Note (Signed)
[facility] Patient Name: Ralph HalterKeith Pienta Procedure Date: 09/09/2019 MRN: [id number] Attending MD: Willis ModenaWilliam Garnell Phenix , MD Date of Birth: 26-Sep-1965 CSN: [id number] Age: [age] Admit Type: Outpatient Procedure:                Upper EUS Indications:              Abnormal chest CT, Dysphagia, Follow-up of                            esophageal stricture Providers:                Willis ModenaWilliam Shayana Hornstein, MD, Dwain SarnaPatricia Ford, RN, Lawson Radararlene                            Davis, Technician, Michele McalpineErik Holloway Technician, Marlena ClipperPeggy                            Flynn-Cook, CRNA Referring MD:             Vida RiggerMarc Magod, MD Medicines:                Monitored Anesthesia Care Complications:            No immediate complications. Estimated Blood Loss:     Estimated blood loss was minimal. Procedure:                Pre-Anesthesia Assessment:                           - Prior to the procedure, a History and Physical                            was performed, and patient medications and                            allergies were reviewed. The patient's tolerance of                            previous anesthesia was also reviewed. The risks                            and benefits of the procedure and the sedation                            options and risks were discussed with the patient.                            All questions were answered, and informed consent                            was obtained. Prior Anticoagulants: The patient has                            taken no previous anticoagulant or antiplatelet                            agents. ASA Grade  Assessment: II - A patient with                            mild systemic disease. After reviewing the risks                            and benefits, the patient was deemed in                            satisfactory condition to undergo the procedure.                           After obtaining informed consent, the endoscope was                            passed under direct  vision. Throughout the                            procedure, the patient's blood pressure, pulse, and                            oxygen saturations were monitored continuously. The                            GF-UE160-[id number] ([id number]) Olympus Radial EUS was                            introduced through the mouth, and advanced to the                            antrum of the stomach. The [id number] ([id number])                            Olympus Linear EUS was introduced through the                            mouth, and advanced to the antrum of the stomach.                            The upper EUS was accomplished without difficulty.                            The patient tolerated the procedure well. Scope In: Scope Out: Findings:      EUS FINDING: (prior to my EUS, Dr. Ewing Schlein did EGD + DIL, please see his       report) :      Diffuse wall thickening was visualized endosonographically in the middle       third of the esophagus and in the lower third of the esophagus. This       appeared to be primarily due to thickening of the luminal       interface/superficial mucosa (Layer 1). Additionally, wall thickening       was noted in the following wall layer(s): submucosa (Layer 3). The       esophageal wall  measured up to 6 mm in total thickness. Fine needle       aspiration for cytology was performed. Color Doppler imaging was       utilized prior to needle puncture to confirm a lack of significant       vascular structures within the needle path. Four passes were made with       the 25 gauge needle using a transesophageal approach. A stylet was used.       A preliminary cytologic examination was not performed. Final cytology       results are pending.      A few lymph nodes were visualized with the ultrasound probe in the       middle paraesophageal mediastinum (level 50M) and lower paraesophageal       mediastinum (level 8L).      There was no sign of significant endosonographic abnormality in the  left       lobe of the liver.      There was no sign of significant endosonographic abnormality in the genu       of the pancreas, pancreatic body and pancreatic tail. Impression:               - Wall thickening was seen in the middle third of                            the esophagus and in the lower third of the                            esophagus. The thickening appeared to primarily be                            within the luminal interface/superficial mucosa                            (Layer 1). Fine needle aspiration performed.                           - A few lymph nodes were visualized and measured in                            the middle paraesophageal mediastinum (level 50M)                            and lower paraesophageal mediastinum (level 8L).                            Most likely reactive; would have to traverse                            esophageal thickening with needle to reach the LN,                            thus did not do FNA, given concern to rule out                            tumor in distal esophageal submucosa.                           -  There was no evidence of significant pathology in                            the left lobe of the liver.                           - There was no sign of significant pathology in the                            genu of the pancreas, pancreatic body and                            pancreatic tail.                           - Overall clinical impression most consistent with                            refractory peptic stricture, but given persistence                            of symptoms and stricture, FNA biopsies were                            obtained to assess for submucosal tumor. Moderate Sedation:      Not Applicable - Patient had care per Anesthesia. Recommendation:           - Discharge patient to home (via wheelchair).                           - Soft diet today.                           - Continue present  medications.                           - Await cytology results.                           - Return to GI clinic after studies are complete. Procedure Code(s):        --- Professional ---                           [phone], Esophagogastroduodenoscopy, flexible,                            transoral; with transendoscopic ultrasound-guided                            intramural or transmural fine needle                            aspiration/biopsy(s) (includes endoscopic                            ultrasound examination of the esophagus, stomach,  and either the duodenum or a surgically altered                            stomach where the jejunum is examined distal to the                            anastomosis) Diagnosis Code(s):        --- Professional ---                           K22.8, Other specified diseases of esophagus                           R93.89, Abnormal findings on diagnostic imaging of                            other specified body structures                           R13.10, Dysphagia, unspecified                           K22.2, Esophageal obstruction CPT copyright 2019 American Medical Association. All rights reserved. The codes documented in this report are preliminary and upon coder review may  be revised to meet current compliance requirements. [name], MD 09/09/2019 12:42:04 PM This report has been signed electronically. Number of Addenda: 0

## 2019-09-09 NOTE — Op Note (Signed)
[facility] Patient Name: Ralph [name] Procedure Date: 09/09/2019 MRN: [id number] Attending MD: Vida Rigger , MD Date of Birth: [birth year] CSN: [id number] Age: 54 Admit Type: Outpatient Procedure:                Upper GI endoscopy Indications:              Dysphagia with concerns for submucosal tumor and                            need for dilation prior to EUS Providers:                Vida Rigger, MD, Dwain Sarna, RN, Lawson Radar,                            Technician, Michele Mcalpine Technician, Marlena Clipper, CRNA Referring MD:              Medicines:                Propofol per Anesthesia Complications:            No immediate complications. Estimated Blood Loss:     Estimated blood loss: none. Estimated blood loss                            was minimal. Procedure:                Pre-Anesthesia Assessment:                           - Prior to the procedure, a History and Physical                            was performed, and patient medications and                            allergies were reviewed. The patient's tolerance of                            previous anesthesia was also reviewed. The risks                            and benefits of the procedure and the sedation                            options and risks were discussed with the patient.                            All questions were answered, and informed consent                            was obtained. Prior Anticoagulants: The patient has                            taken  no previous anticoagulant or antiplatelet                            agents. ASA Grade Assessment: II - A patient with                            mild systemic disease. After reviewing the risks                            and benefits, the patient was deemed in                            satisfactory condition to undergo the procedure.                           After obtaining informed consent, the  endoscope was                            passed under direct vision. Throughout the                            procedure, the patient's blood pressure, pulse, and                            oxygen saturations were monitored continuously. The                            [id number]-[id number] ([id number] Olympus gastroscope was                            introduced through the mouth, and advanced to the                            second part of duodenum. The upper GI endoscopy was                            accomplished without difficulty. The patient                            tolerated the procedure well. Scope In: Scope Out: Findings:      A medium-sized hiatal hernia was present.      One benign-appearing, intrinsic mild stenosis was found. The stenosis       was traversed. A TTS dilator was passed through the scope. Dilation with       a 15-16.5-18 mm balloon dilator was performed to 16.5 mm. The dilation       site was examined and showed mild mucosal disruption and mild       improvement in luminal narrowing. Estimated blood loss was minimal.      LA Grade D (one or more mucosal breaks involving at least 75% of       esophageal circumference) esophagitis with no bleeding was found.      The entire examined stomach was normal.      The duodenal bulb, first portion of the duodenum and second portion of  the duodenum were normal.      The exam was otherwise without abnormality. Impression:               - Medium-sized hiatal hernia.                           - Benign-appearing esophageal stenosis. Dilated.                           - LA Grade D reflux esophagitis with no bleeding.                           - Normal stomach.                           - Normal duodenal bulb, first portion of the                            duodenum and second portion of the duodenum.                           - The examination was otherwise normal.                           - No specimens collected. Moderate  Sedation:      Not Applicable - Patient had care per Anesthesia. Recommendation:           - Clear liquid diet for 4 hours.                           - Continue present medications.                           - Return to GI clinic in 4 weeks.                           - Telephone GI clinic if symptomatic PRN. Procedure Code(s):        --- Professional ---                           [phone], Esophagogastroduodenoscopy, flexible,                            transoral; with transendoscopic balloon dilation of                            esophagus (less than 30 mm diameter) Diagnosis Code(s):        --- Professional ---                           K44.9, Diaphragmatic hernia without obstruction or                            gangrene                           K22.2, Esophageal obstruction  K21.00, Gastro-esophageal reflux disease with                            esophagitis, without bleeding                           R13.10, Dysphagia, unspecified CPT copyright 2019 American Medical Association. All rights reserved. The codes documented in this report are preliminary and upon coder review may  be revised to meet current compliance requirements. Vida Rigger, MD 09/09/2019 11:54:18 AM This report has been signed electronically. Number of Addenda: 0

## 2019-09-09 NOTE — Anesthesia Preprocedure Evaluation (Addendum)
Anesthesia Evaluation  Patient identified by MRN, date of birth, ID band Patient awake    Reviewed: Allergy & Precautions, NPO status , Patient's Chart, lab work & pertinent test results  Airway Mallampati: I  TM Distance: >3 FB Neck ROM: Full    Dental  (+) Poor Dentition, Missing, Dental Advisory Given, Chipped   Pulmonary Current Smoker,    breath sounds clear to auscultation       Cardiovascular hypertension, + CAD, + Past MI and + Peripheral Vascular Disease   Rhythm:Regular Rate:Normal     Neuro/Psych PSYCHIATRIC DISORDERS Depression CVA    GI/Hepatic Neg liver ROS, GERD  Medicated,  Endo/Other  negative endocrine ROS  Renal/GU negative Renal ROS  negative genitourinary   Musculoskeletal negative musculoskeletal ROS (+)   Abdominal Normal abdominal exam  (+)   Peds  Hematology negative hematology ROS (+)   Anesthesia Other Findings   Reproductive/Obstetrics                            Anesthesia Physical Anesthesia Plan  ASA: III  Anesthesia Plan: MAC   Post-op Pain Management:    Induction: Intravenous  PONV Risk Score and Plan: 0 and Propofol infusion  Airway Management Planned: Natural Airway and Simple Face Mask  Additional Equipment: None  Intra-op Plan:   Post-operative Plan:   Informed Consent: I have reviewed the patients History and Physical, chart, labs and discussed the procedure including the risks, benefits and alternatives for the proposed anesthesia with the patient or authorized representative who has indicated his/her understanding and acceptance.     Dental advisory given  Plan Discussed with: CRNA  Anesthesia Plan Comments: (Echo:  1. Normal LV systolic function; grade 1 diastolic dysfunction.  2. Left ventricular ejection fraction, by estimation, is 60 to 65%. The  left ventricle has normal function. The left ventricle has no regional  wall  motion abnormalities. Left ventricular diastolic parameters are  consistent with Grade I diastolic  dysfunction (impaired relaxation).  3. Right ventricular systolic function is normal. The right ventricular  size is normal.  4. The mitral valve is normal in structure. No evidence of mitral valve  regurgitation. No evidence of mitral stenosis.  5. The aortic valve was not well visualized. Aortic valve regurgitation  is not visualized. No aortic stenosis is present.  6. The inferior vena cava is normal in size with greater than 50%  respiratory variability, suggesting right atrial pressure of 3 mmHg.   EKG: normal sinus rhythm.:)       Anesthesia Quick Evaluation

## 2019-09-10 LAB — CYTOLOGY - NON PAP

## 2019-09-14 ENCOUNTER — Ambulatory Visit (HOSPITAL_COMMUNITY)
Admission: RE | Admit: 2019-09-14 | Discharge: 2019-09-14 | Disposition: A | Discharge status: Home / Self Care | Payer: Self-pay | Source: Ambulatory Visit | Attending: Family Medicine | Admitting: Family Medicine

## 2019-09-14 ENCOUNTER — Other Ambulatory Visit: Payer: Self-pay

## 2019-09-14 ENCOUNTER — Encounter: Payer: Self-pay | Admitting: Family Medicine

## 2019-09-14 ENCOUNTER — Ambulatory Visit (INDEPENDENT_AMBULATORY_CARE_PROVIDER_SITE_OTHER): Payer: Self-pay | Admitting: Family Medicine

## 2019-09-14 VITALS — BP 126/72 | HR 72 | Ht 68.0 in | Wt 132.2 lb

## 2019-09-14 DIAGNOSIS — I6990 Unspecified sequelae of unspecified cerebrovascular disease: Secondary | ICD-10-CM

## 2019-09-14 DIAGNOSIS — I6930 Unspecified sequelae of cerebral infarction: Secondary | ICD-10-CM

## 2019-09-14 DIAGNOSIS — E7800 Pure hypercholesterolemia, unspecified: Secondary | ICD-10-CM

## 2019-09-14 DIAGNOSIS — F339 Major depressive disorder, recurrent, unspecified: Secondary | ICD-10-CM

## 2019-09-14 DIAGNOSIS — K4090 Unilateral inguinal hernia, without obstruction or gangrene, not specified as recurrent: Secondary | ICD-10-CM

## 2019-09-14 DIAGNOSIS — R079 Chest pain, unspecified: Secondary | ICD-10-CM

## 2019-09-14 DIAGNOSIS — R634 Abnormal weight loss: Secondary | ICD-10-CM

## 2019-09-14 DIAGNOSIS — F17200 Nicotine dependence, unspecified, uncomplicated: Secondary | ICD-10-CM

## 2019-09-14 MED ORDER — ATORVASTATIN CALCIUM 80 MG PO TABS: 80.0000 mg | ORAL_TABLET | Freq: Every day | ORAL | 3 refills | Status: DC

## 2019-09-14 MED ORDER — GABAPENTIN 300 MG PO CAPS: 300.0000 mg | ORAL_CAPSULE | Freq: Three times a day (TID) | ORAL | 3 refills | Status: DC

## 2019-09-14 MED ORDER — ASPIRIN EC 81 MG PO TBEC: 81.0000 mg | DELAYED_RELEASE_TABLET | Freq: Every day | ORAL | 11 refills | Status: DC

## 2019-09-14 NOTE — Assessment & Plan Note (Addendum)
At risk for CAD. Need to restart ASA and statin.

## 2019-09-14 NOTE — Assessment & Plan Note (Signed)
Improved.  No longer suicidal.

## 2019-09-14 NOTE — Assessment & Plan Note (Signed)
New.  Needs improved nutritional status before refer for surg.

## 2019-09-14 NOTE — Progress Notes (Signed)
    SUBJECTIVE:   CHIEF COMPLAINT / HPI:   Multiple issues: 1. Esophageal stricture.  Finally improved.  He is eating and gaining weight.  This is very good news.  I do not have access to all biopsies (I don't think I do) None suggest cancer.  Scans are also benign. 2. Tobacco and alcohol use.  Was depressed and using heavily.  Now cutting back. 3. Depression.  Was actively suicidal in his worst physical illness.  "I would not do that because of my daughter."  No longer suicidal.  Still struggling with depression.  Currently separated from wife who is reportedly abusing drugs and alcohol. 4. Knot that comes and goes in right groin.  Painful.  No change in bowel or bladder. 5. Chest pain.  Why do I hurt all the time?  Constant pain that has been attributed to his esophagitis.  Does state that the pain gets worse with activity.  At risk for CAD (S/P CVA and known PVD).  Not currently on statin or aspirin.      OBJECTIVE:   BP 126/72   Pulse 72   Ht 5\' 8"  (1.727 m)   Wt 132 lb 3.2 oz (60 kg)   SpO2 100%   BMI 20.10 kg/m   Lungs clear Cardiac RRR wtihout m or g Abd benign. Right indirect large inquinal hernia.  Easily reducable. Not weight increase  EKG done, no acute changes.  ASSESSMENT/PLAN:   Weight loss Improved.  Now eating well and gaining weight.  Tobacco use disorder Advised to cut back.  Will discuss at follow up.  Right inguinal hernia New.  Needs improved nutritional status before refer for surg.  Late effects of CVA (cerebrovascular accident) At risk for CAD. Need to restart ASA and statin.  Pure hypercholesterolemia Restart statin.  Chest pain, unspecified I envision this as a chronic pain syndrome given months duration.  Trial of gabapentin.  Depression, recurrent (HCC) Improved.  No longer suicidal.     , MD [facility]

## 2019-09-14 NOTE — Assessment & Plan Note (Signed)
I envision this as a chronic pain syndrome given months duration.  Trial of gabapentin.

## 2019-09-14 NOTE — Patient Instructions (Addendum)
You have a hernia in groin.  (Inguinal hernia)  The only fix for a hernia is surgery.  There is a thing called a truss.  You might want to look into that.  Given your health problems, no surgery any time soon. Keep eating and keep on your stomach medicine.  You are finally moving in the right direction.  Do your best to cut back or cut out the smoking and drinking. I need to get you back on an 81 mg aspirin daily and on your cholesterol medicine.  Because you have had one stroke, you are at risk for another stroke or a heart attack.  I am giving you gabapentin for your pain. Start gabapentin this week. Start the cholesterol medicine - atorvastatin, next week Start the aspirin the following week.  Only use tylenol for pain.  No aspirin or ibuprofen    By my read of the tests, you do not have cancer.

## 2019-09-14 NOTE — Assessment & Plan Note (Signed)
Advised to cut back.  Will discuss at follow up.

## 2019-09-14 NOTE — Assessment & Plan Note (Signed)
Restart statin  

## 2019-09-14 NOTE — Assessment & Plan Note (Signed)
Improved.  Now eating well and gaining weight.

## 2019-10-05 ENCOUNTER — Ambulatory Visit: Payer: Self-pay | Admitting: Family Medicine

## 2019-10-19 ENCOUNTER — Ambulatory Visit: Payer: Self-pay | Admitting: Family Medicine

## 2020-04-06 ENCOUNTER — Encounter: Payer: Self-pay | Admitting: Family Medicine

## 2020-04-06 ENCOUNTER — Ambulatory Visit (INDEPENDENT_AMBULATORY_CARE_PROVIDER_SITE_OTHER): Payer: Self-pay | Admitting: Family Medicine

## 2020-04-06 ENCOUNTER — Other Ambulatory Visit: Payer: Self-pay

## 2020-04-06 VITALS — BP 112/68 | HR 55 | Ht 68.0 in | Wt 130.0 lb

## 2020-04-06 DIAGNOSIS — K4090 Unilateral inguinal hernia, without obstruction or gangrene, not specified as recurrent: Secondary | ICD-10-CM

## 2020-04-06 DIAGNOSIS — R1319 Other dysphagia: Secondary | ICD-10-CM

## 2020-04-06 DIAGNOSIS — Z716 Tobacco abuse counseling: Secondary | ICD-10-CM

## 2020-04-06 DIAGNOSIS — K2100 Gastro-esophageal reflux disease with esophagitis, without bleeding: Secondary | ICD-10-CM

## 2020-04-06 DIAGNOSIS — F339 Major depressive disorder, recurrent, unspecified: Secondary | ICD-10-CM

## 2020-04-06 DIAGNOSIS — R590 Localized enlarged lymph nodes: Secondary | ICD-10-CM

## 2020-04-06 DIAGNOSIS — E440 Moderate protein-calorie malnutrition: Secondary | ICD-10-CM

## 2020-04-06 DIAGNOSIS — N451 Epididymitis: Secondary | ICD-10-CM | POA: Insufficient documentation

## 2020-04-06 MED ORDER — DOXYCYCLINE HYCLATE 100 MG PO TABS: 100.0000 mg | ORAL_TABLET | Freq: Two times a day (BID) | ORAL | 0 refills | Status: DC

## 2020-04-06 MED ORDER — PANTOPRAZOLE SODIUM 40 MG PO TBEC: 40.0000 mg | DELAYED_RELEASE_TABLET | Freq: Two times a day (BID) | ORAL | 12 refills | Status: DC

## 2020-04-06 MED ORDER — BUPROPION HCL 75 MG PO TABS
ORAL_TABLET | ORAL | 3 refills | Status: DC
Start: 2020-04-06 — End: 2020-06-14

## 2020-04-06 NOTE — Patient Instructions (Addendum)
As best I can tell, you do not have any proven cancer.  It is not on our problem list.  I have reviewed all the biopsies and none show cancer.   I think the main swallowing and pain problem is severe esophagitis.  All the biopsies have been negative - no cancer.  You need to get back to the twice a day protonix.  That is your mainstay medicine. Damn it: quit smoking.  It is making everything worse in the long run. I sent in a prescription that should help your nerves and make it easier for you to quit.   For your groin and testicle.  You definitely have a hernia.  Watch for signs of an incarcerated hernia.  That would be a surgical emergency. You also have some extra swelling of that right scrotum and testicle.  I am going to treat you as if this is an infection - epididymitis.  I sent in an antibiotic Call me next week.  I hope that: 1. You are swallowing better because you are back on the protonix 2. Your testicle is less painful as the antibiotic hopefully knocks out the infection 3. Hopefully you have set a quit date and your nerves are better on the medicine.

## 2020-04-07 ENCOUNTER — Encounter: Payer: Self-pay | Admitting: Family Medicine

## 2020-04-07 NOTE — Assessment & Plan Note (Signed)
Stable, not incarcerated.  Given signs and symptoms of incarceration.

## 2020-04-07 NOTE — Assessment & Plan Note (Signed)
Chronic.  Treat underlying esophagitis.

## 2020-04-07 NOTE — Assessment & Plan Note (Signed)
Bupropion for depression and for smoking cessation.

## 2020-04-07 NOTE — Assessment & Plan Note (Signed)
Should improve as I treat the underlying esophagitis.

## 2020-04-07 NOTE — Progress Notes (Signed)
    SUBJECTIVE:   CHIEF COMPLAINT / HPI:   Abd and right groin pain and much more. Patient has known, longstanding esophagitis causing dysphagia.  He has lost his insurance and has not been to GI recently.  1 week ago developed right groin pain.  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  AVS shows dx of "History of gastric cancer.  We (family and doctors) have been worried about the possibility of cancer for quite some time.  To date, that dx has not been confirmed.  Issues:  1. Epigastric pain and difficulty swallowing.  Known reflux with severe esophagitis.  Has been out of his protonix for several weeks.  During that time, pain and dysphagia have been worsening. 2. Right groin pain.  Known right inquinal hernia.  C/O worsening pain and swelling now at testicle.  As part of [facility] at [facility], he had a CT scan which showed a right varicocele.   3. Tobacco abuse.  Continues to smoke.  Knows it is bad in the long run.  "But, its the only thing that helps with my nerves." 4. Does he have any documented cancer?  I did a thorough review of all previous biopsies.  I also reviewed the CT scan from [facility].  I could not find any biopsy that indicated cancer.  He has never been treated for cancer.  Reassuring: Mediastinal nodes seen on a 2021 CT of chest were not present on CT this week at [facility].   OBJECTIVE:   BP 112/68   Pulse (!) 55   Ht 5\' 8"  (1.727 m)   Wt 130 lb (59 kg)   SpO2 95%   BMI 19.77 kg/m   Wt and VS noted. Lungs clear Cardiac RRR without m or g Abd epigastric tenderness without mass or rebound. Right inquinal hernia which was reducable.  After hernia reduced, still had right testicular swelling, which was tender.  ASSESSMENT/PLAN:   Tobacco abuse counseling Quit smoking.  Rx with bupropion.  Mediastinal adenopathy Resolved.  Reassuring that we are dealing with a non-malignant process.  Epididymitis Will treat as if epididymitis.  Doxy.    Right inguinal  hernia Stable, not incarcerated.  Given signs and symptoms of incarceration.  Depression, recurrent (HCC) Bupropion for depression and for smoking cessation.  Protein-calorie malnutrition (HCC) Chronic.  Treat underlying esophagitis.  GERD with esophagitis Restart twice daily PPI.  Dysphagia Should improve as I treat the underlying esophagitis.     , MD [facility]

## 2020-04-07 NOTE — Assessment & Plan Note (Signed)
Resolved.  Reassuring that we are dealing with a non-malignant process.

## 2020-04-07 NOTE — Assessment & Plan Note (Signed)
Restart twice daily PPI.

## 2020-04-07 NOTE — Assessment & Plan Note (Signed)
Will treat as if epididymitis.  Doxy.

## 2020-04-07 NOTE — Assessment & Plan Note (Signed)
Quit smoking.  Rx with bupropion.

## 2020-05-23 ENCOUNTER — Encounter (HOSPITAL_COMMUNITY): Payer: Self-pay | Admitting: Emergency Medicine

## 2020-05-23 ENCOUNTER — Emergency Department (HOSPITAL_COMMUNITY): Payer: Medicaid Other

## 2020-05-23 ENCOUNTER — Telehealth: Payer: Self-pay | Admitting: *Deleted

## 2020-05-23 ENCOUNTER — Inpatient Hospital Stay (HOSPITAL_COMMUNITY)
Admission: EM | Admit: 2020-05-23 | Discharge: 2020-06-14 | DRG: 391 | Disposition: A | Discharge status: Home / Self Care | Payer: Medicaid Other | Attending: Family Medicine | Admitting: Family Medicine

## 2020-05-23 ENCOUNTER — Other Ambulatory Visit: Payer: Self-pay

## 2020-05-23 DIAGNOSIS — U071 COVID-19: Secondary | ICD-10-CM | POA: Diagnosis present

## 2020-05-23 DIAGNOSIS — I6990 Unspecified sequelae of unspecified cerebrovascular disease: Secondary | ICD-10-CM

## 2020-05-23 DIAGNOSIS — R059 Cough, unspecified: Secondary | ICD-10-CM

## 2020-05-23 DIAGNOSIS — K222 Esophageal obstruction: Principal | ICD-10-CM | POA: Diagnosis present

## 2020-05-23 DIAGNOSIS — R509 Fever, unspecified: Secondary | ICD-10-CM

## 2020-05-23 DIAGNOSIS — E43 Unspecified severe protein-calorie malnutrition: Secondary | ICD-10-CM | POA: Diagnosis present

## 2020-05-23 DIAGNOSIS — I252 Old myocardial infarction: Secondary | ICD-10-CM

## 2020-05-23 DIAGNOSIS — F1010 Alcohol abuse, uncomplicated: Secondary | ICD-10-CM | POA: Diagnosis present

## 2020-05-23 DIAGNOSIS — E871 Hypo-osmolality and hyponatremia: Secondary | ICD-10-CM | POA: Diagnosis present

## 2020-05-23 DIAGNOSIS — F17200 Nicotine dependence, unspecified, uncomplicated: Secondary | ICD-10-CM | POA: Diagnosis present

## 2020-05-23 DIAGNOSIS — Z28310 Unvaccinated for covid-19: Secondary | ICD-10-CM

## 2020-05-23 DIAGNOSIS — K4090 Unilateral inguinal hernia, without obstruction or gangrene, not specified as recurrent: Secondary | ICD-10-CM | POA: Diagnosis present

## 2020-05-23 DIAGNOSIS — E878 Other disorders of electrolyte and fluid balance, not elsewhere classified: Secondary | ICD-10-CM | POA: Diagnosis present

## 2020-05-23 DIAGNOSIS — I2510 Atherosclerotic heart disease of native coronary artery without angina pectoris: Secondary | ICD-10-CM | POA: Diagnosis present

## 2020-05-23 DIAGNOSIS — I739 Peripheral vascular disease, unspecified: Secondary | ICD-10-CM | POA: Diagnosis present

## 2020-05-23 DIAGNOSIS — K2211 Ulcer of esophagus with bleeding: Secondary | ICD-10-CM | POA: Diagnosis present

## 2020-05-23 DIAGNOSIS — J1282 Pneumonia due to coronavirus disease 2019: Secondary | ICD-10-CM | POA: Diagnosis present

## 2020-05-23 DIAGNOSIS — I69354 Hemiplegia and hemiparesis following cerebral infarction affecting left non-dominant side: Secondary | ICD-10-CM

## 2020-05-23 DIAGNOSIS — K2101 Gastro-esophageal reflux disease with esophagitis, with bleeding: Secondary | ICD-10-CM | POA: Diagnosis present

## 2020-05-23 DIAGNOSIS — R1310 Dysphagia, unspecified: Secondary | ICD-10-CM

## 2020-05-23 DIAGNOSIS — R1314 Dysphagia, pharyngoesophageal phase: Secondary | ICD-10-CM | POA: Diagnosis present

## 2020-05-23 DIAGNOSIS — R1115 Cyclical vomiting syndrome unrelated to migraine: Secondary | ICD-10-CM | POA: Diagnosis present

## 2020-05-23 DIAGNOSIS — R64 Cachexia: Secondary | ICD-10-CM | POA: Diagnosis present

## 2020-05-23 DIAGNOSIS — Z23 Encounter for immunization: Secondary | ICD-10-CM

## 2020-05-23 DIAGNOSIS — I10 Essential (primary) hypertension: Secondary | ICD-10-CM | POA: Diagnosis present

## 2020-05-23 DIAGNOSIS — E785 Hyperlipidemia, unspecified: Secondary | ICD-10-CM | POA: Diagnosis present

## 2020-05-23 DIAGNOSIS — Z79899 Other long term (current) drug therapy: Secondary | ICD-10-CM

## 2020-05-23 DIAGNOSIS — E860 Dehydration: Secondary | ICD-10-CM | POA: Diagnosis present

## 2020-05-23 DIAGNOSIS — T18128A Food in esophagus causing other injury, initial encounter: Secondary | ICD-10-CM | POA: Diagnosis present

## 2020-05-23 DIAGNOSIS — E7800 Pure hypercholesterolemia, unspecified: Secondary | ICD-10-CM | POA: Diagnosis present

## 2020-05-23 DIAGNOSIS — R8290 Unspecified abnormal findings in urine: Secondary | ICD-10-CM | POA: Diagnosis present

## 2020-05-23 DIAGNOSIS — F17210 Nicotine dependence, cigarettes, uncomplicated: Secondary | ICD-10-CM | POA: Diagnosis present

## 2020-05-23 DIAGNOSIS — Z681 Body mass index (BMI) 19 or less, adult: Secondary | ICD-10-CM

## 2020-05-23 DIAGNOSIS — Z7982 Long term (current) use of aspirin: Secondary | ICD-10-CM

## 2020-05-23 DIAGNOSIS — E876 Hypokalemia: Secondary | ICD-10-CM | POA: Diagnosis present

## 2020-05-23 DIAGNOSIS — F329 Major depressive disorder, single episode, unspecified: Secondary | ICD-10-CM | POA: Diagnosis present

## 2020-05-23 LAB — COMPREHENSIVE METABOLIC PANEL
ALT: 12 U/L (ref 0–44)
AST: 20 U/L (ref 15–41)
Albumin: 3.9 g/dL (ref 3.5–5.0)
Alkaline Phosphatase: 88 U/L (ref 38–126)
Anion gap: 12 (ref 5–15)
BUN: 10 mg/dL (ref 6–20)
CO2: 28 mmol/L (ref 22–32)
Calcium: 9.8 mg/dL (ref 8.9–10.3)
Chloride: 93 mmol/L — ABNORMAL LOW (ref 98–111)
Creatinine, Ser: 0.76 mg/dL (ref 0.61–1.24)
GFR, Estimated: >60 mL/min (ref >60)
Glucose, Bld: 79 mg/dL (ref 70–99)
Potassium: 3.9 mmol/L (ref 3.5–5.1)
Sodium: 133 mmol/L — ABNORMAL LOW (ref 135–145)
Total Bilirubin: 1.2 mg/dL (ref 0.3–1.2)
Total Protein: 7.2 g/dL (ref 6.5–8.1)

## 2020-05-23 LAB — CBC WITH DIFFERENTIAL/PLATELET
Abs Immature Granulocytes: 0.03 10*3/uL (ref 0.00–0.07)
Basophils Absolute: 0.1 10*3/uL (ref 0.0–0.1)
Basophils Relative: 1 %
Eosinophils Absolute: 0.2 10*3/uL (ref 0.0–0.5)
Eosinophils Relative: 3 %
HCT: 50.8 % (ref 39.0–52.0)
Hemoglobin: 16.8 g/dL (ref 13.0–17.0)
Immature Granulocytes: 0 %
Lymphocytes Relative: 18 %
Lymphs Abs: 1.5 10*3/uL (ref 0.7–4.0)
MCH: 32.4 pg (ref 26.0–34.0)
MCHC: 33.1 g/dL (ref 30.0–36.0)
MCV: 97.9 fL (ref 80.0–100.0)
Monocytes Absolute: 0.7 10*3/uL (ref 0.1–1.0)
Monocytes Relative: 8 %
Neutro Abs: 5.9 10*3/uL (ref 1.7–7.7)
Neutrophils Relative %: 70 %
Platelets: 285 10*3/uL (ref 150–400)
RBC: 5.19 MIL/uL (ref 4.22–5.81)
RDW: 14.7 % (ref 11.5–15.5)
WBC: 8.3 10*3/uL (ref 4.0–10.5)
nRBC: 0 % (ref 0.0–0.2)

## 2020-05-23 LAB — RESP PANEL BY RT-PCR (FLU A&B, COVID) ARPGX2
Influenza A by PCR: NEGATIVE
Influenza B by PCR: NEGATIVE
SARS Coronavirus 2 by RT PCR: POSITIVE — AB

## 2020-05-23 MED ORDER — PANTOPRAZOLE SODIUM 40 MG IV SOLR
40.0000 mg | Freq: Once | INTRAVENOUS | Status: AC
  Administered 2020-05-23: 40 mg via INTRAVENOUS
  Filled 2020-05-23: qty 40

## 2020-05-23 MED ORDER — THIAMINE HCL 100 MG/ML IJ SOLN
100.0000 mg | Freq: Once | INTRAMUSCULAR | Status: AC
  Administered 2020-05-23: 100 mg via INTRAVENOUS
  Filled 2020-05-23: qty 2

## 2020-05-23 MED ORDER — MORPHINE SULFATE (PF) 2 MG/ML IV SOLN
2.0000 mg | Freq: Once | INTRAVENOUS | Status: AC
  Administered 2020-05-23: 2 mg via INTRAVENOUS
  Filled 2020-05-23: qty 1

## 2020-05-23 MED ORDER — SODIUM CHLORIDE 0.9 % IV BOLUS
1000.0000 mL | Freq: Once | INTRAVENOUS | Status: AC
  Administered 2020-05-23: 1000 mL via INTRAVENOUS

## 2020-05-23 NOTE — ED Triage Notes (Signed)
Pt BIB family, reports pt has a tumor in his throat, stomach and a "spot on his lung", pt has lost more weight and is unable to keep anything down. C/o constant pain and shortness of breath with exertion. Also reports abdominal pain, back pain and blood in his urine.

## 2020-05-23 NOTE — Telephone Encounter (Signed)
[name] calls to report that patient is down to 119lbs and is unable to eat or swallow.  Would like Dr. Leveda Anna to give them a call.  No further details given. Jone Baseman, CMA

## 2020-05-23 NOTE — H&P (Addendum)
Family Medicine Teaching [facility] Admission History and Physical Service Pager: [phone]  Patient name: Ralph [name] Medical record number: [id number] Date of birth: 25-Apr-1965 Age: 55 y.o. Gender: male  Primary Care Provider: Moses Manners, MD Consultants: GI Code Status: FULL Preferred Emergency Contact: Garner Gavel, wife  Chief Complaint: dysphagia  Assessment and Plan: Ralph [name] is a 55 y.o. male presenting with dysphagia and weightloss. PMH is significant for gastritis and esophageal stricture, CVA, tobacco use, HTN, HLD.  Dysphagia  Weight loss  Concern for GI malignancy  55 yo man presents with long history of dysphagia with history of multiple esophageal dilations that has become acutely worse in the last week. He has not been able to tolerate any solid foods in 4 days, and is now minimally able to tolerate fluids. He does have a history of gastritis and esophageal stricture, he has not seen GI since last summer when there was concern for a submucosal tumor. Patient had CT chest and a/p in July 2021 that showed diffusely patulous esophagus, distended stomach, and 6 mm nodule in the posterior left lower lobe. He then had an EGD with [place] in August 2021 that demonstrated submucosal thickening of distal esophagus and reactive LNs. An FNA of the esophageal thickening was performed, LNs could not be reached; however the cytology of the FNA from 09/09/19 FNA is not available in epic. Previous FNA of distal esophagus on 07/16/19 was negative for malignancy and c/w gastritis; biopsy of distal esophagus on 05/27/19 and 06/01/19 negative for malignancy, c/w esophageal candidiasis. However, biopsy of distal esophagus on 04/29/19 c/w squamocolumnar junction with focal intestinal metaplasia, negative for malignancy.  Patient is clinically stable at this time, labs overall unremarkable other than mild hyponatremia and hypochloremia.  Suspect he will require another esophageal dilation, will  admit patient for IV hydration and further management. Will obtain CT imaging of chest and abdomen/pelvis as it has been 7 months since last imaging and want to assess for malignancy. Plan to consult GI in the morning. - admit to med-surg, Dr. Manson Passey attending - CT chest, abdomen, pelvis to assess for malignancy - AM labs: CBC, BMP, HIV screening - mIVF D5NS 125 ml/hr - vitals per unit routine - NPO - PPI IV - c/s GI in AM - Lovenox for DVT ppx - Up with assistance  COVID-19 infection Unvaccinated without known sick contacts here with mild infection with symptoms of rhinorrhea, sore throat, and dyspnea without oxygen requirement incidentally testing positive for COVID-19 on admission.  CXR unremarkable.  Will treat supportively and monitor.  - airborne/droplet precautions - continuous pulse ox x 24 hr  Chest pain Patient reporting pain in the middle of his chest, suspect likely due to underlying esophagitis or possible MSK etiology/costochondritis given reproducibility on palpation.  CXR and EKG unremarkable.  Low suspicion for PE given stable vitals, no tachycardia. Limited utility of d-dimer in setting of COVID+ status.  - IV morphine 1 mg q2h prn - consider CTA chest if clinically worsening  Hematuria Patient states that his wife had noticed that he had an episode of hematuria 2 days ago.  He states that he has had no more urine since then.  He does endorse some bilateral flank pain as well. Patient has been unable to tolerate fluids, just received IVF bolus. If patient cannot urinate when attempts in the next hour, will get bladder scan to assess for outlet obstruction. - f/u UA, urine culture - f/u CT A/P  Cachexia  Protein  calorie malnutrition Reports 10 pound weight loss in the past 1 to 2 weeks, secondary to decreased oral intake in the setting of dysphagia.  Cachectic appearing on exam. Patient is presently NPO due to inability to tolerate fluids (see above), will add D5 to IVF  tonight and consult RD tomorrow. - RD c/s  Electrolyte derangement  hyponatremia, hypochloremia With sodium 133 and chloride 93.  Likely secondary to poor oral intake. - IV D5NS as above - AM BMP  H/o HTN Not on any home antihypertensives and adequately controlled so far with SBP 110s to 130s.  HLD On atorvastatin, though appears patient is not taking this. - hold home medications given NPO  CVA Patient has history of stroke in 2018 with residual left-sided weakness. Home medications include atorvastatin and ASA, though appears patient is not taking these. - hold home medications given NPO   Right inguinal hernia Patient has known hernia, diagnosed with varicocele at [facility] recently. He saw Dr. Leveda AnnaHensel in March and was treated with doxycycline.  Patient states that hernia has resolved and he does not have any pain in the area.  Tobacco use disorder Patient reports smoking 1PPD since age of 55, recently cut back to 1/2PPD. He was prescribed wellbutrin for smoking cessation, though appears he has not been taking this. - nicotine patch  EtOH use He reports that he drinks 1-2 40 oz beers per night. Last drink 4/18. Denies history of alcohol withdrawal. - monitor CIWA x 24 hr, no lorazepam  MDD  Patient was prescribed wellbutrin for MDD and smoking cessation, though.  Patient is not taking this. - hold home medications given NPO  FEN/GI: NPO, PTX Prophylaxis: lovenox  Disposition: Med-surg pending further medical work up and stabilization  History of Present Illness:   Ralph [name] is a 55 y.o. male presenting with dysphagia and unable to tolerate food for 4 days, now unable to drink fluids. He has been having excess spit and reports spitting for several days. He also has food and fluid coming back up when he tries to eat or drink anything. He reports chest pain in the middle of his chest that he thinks is from his esophagus. This pain he feels is like a pressure, pain medication  helps; it doesn't get better or worse with drinking, not affected by moving, does feel better at rest. The pain wraps around to both flanks. He reports several days of feeling SOB, some nausea, runny nose, sore throat, mild cough. He was in usual health able to eat and drink until a few days ago. He has not had a bowel movement in 2 weeks, but hasn't been eating much. He reports that he cannot urinate. His partner reports that his urine was red 2 days ago, but he has noticed it being yellow and normal other than this one event.  He reports that he has lost another 10 lbs in 1-2 weeks.  He reports that he smokes 1/2PPD, drinks 1-2 40 oz beers daily. Occasionally smokes marijuana.  Review Of Systems: Per HPI with the following additions:   Review of Systems  Constitutional: Negative for appetite change.  HENT: Positive for rhinorrhea and sore throat.   Respiratory: Positive for cough, choking and shortness of breath. Negative for wheezing.   Cardiovascular: Positive for chest pain.  Gastrointestinal: Positive for abdominal pain and nausea. Negative for diarrhea and vomiting.  Genitourinary: Positive for flank pain. Negative for dysuria.  Musculoskeletal: Negative for arthralgias and myalgias.  Neurological: Negative for dizziness  and light-headedness.     Patient Active Problem List   Diagnosis Date Noted  . Epididymitis 04/06/2020  . Right inguinal hernia 09/14/2019  . Depression, recurrent (HCC) 06/11/2019  . GERD with esophagitis 05/27/2019  . Bradycardia 05/04/2019  . Chest pain, unspecified 05/04/2019  . Does not have health insurance   . Dysphagia   . Hyponatremia   . Protein-calorie malnutrition (HCC)   . Tobacco use disorder   . Neuropathic pain 08/29/2017  . High risk social situation 08/29/2017  . 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  . Pure hypercholesterolemia 11/26/2012  . Cerebrovascular disease 11/25/2012  . Peripheral vascular disease,  unspecified (HCC) 11/25/2012  . Coronary artery disease 11/25/2012  . Tobacco abuse counseling 11/25/2012  . Essential hypertension, benign 11/25/2012    Past Medical History: Past Medical History:  Diagnosis Date  . Coronary artery disease   . Myocardial infarction (HCC)   . Stroke [facility])     Past Surgical History: Past Surgical History:  Procedure Laterality Date  . APPENDECTOMY    . BALLOON DILATION N/A 06/01/2019   Procedure: BALLOON DILATION;  Surgeon: Vida Rigger, MD;  Location: [facility] ENDOSCOPY;  Service: Endoscopy;  Laterality: N/A;  . BALLOON DILATION N/A 07/16/2019   Procedure: BALLOON DILATION;  Surgeon: Vida Rigger, MD;  Location: [facility];  Service: Endoscopy;  Laterality: N/A;  . BALLOON DILATION N/A 09/09/2019   Procedure: BALLOON DILATION;  Surgeon: Vida Rigger, MD;  Location: [facility];  Service: Endoscopy;  Laterality: N/A;  . BIOPSY  04/29/2019   Procedure: BIOPSY;  Surgeon: Charlott Rakes, MD;  Location: [facility] ENDOSCOPY;  Service: Endoscopy;;  . BIOPSY  05/27/2019   Procedure: BIOPSY;  Surgeon: [name], MD;  Location: [facility];  Service: Endoscopy;;  . BIOPSY  06/01/2019   Procedure: BIOPSY;  Surgeon: Vida Rigger, MD;  Location: [facility];  Service: Endoscopy;;  . BIOPSY  07/16/2019   Procedure: BIOPSY;  Surgeon: Vida Rigger, MD;  Location: [facility];  Service: Endoscopy;;  . ESOPHAGOGASTRODUODENOSCOPY N/A 06/01/2019   Procedure: ESOPHAGOGASTRODUODENOSCOPY (EGD);  Surgeon: Vida Rigger, MD;  Location: [facility];  Service: Endoscopy;  Laterality: N/A;  . ESOPHAGOGASTRODUODENOSCOPY (EGD) WITH PROPOFOL N/A 04/29/2019   Procedure: ESOPHAGOGASTRODUODENOSCOPY (EGD) WITH PROPOFOL;  Surgeon: Charlott Rakes, MD;  Location: [facility] ENDOSCOPY;  Service: Endoscopy;  Laterality: N/A;  . ESOPHAGOGASTRODUODENOSCOPY (EGD) WITH PROPOFOL N/A 05/27/2019   Procedure: ESOPHAGOGASTRODUODENOSCOPY (EGD) WITH PROPOFOL;  Surgeon: [name], MD;  Location: [facility];   Service: Endoscopy;  Laterality: N/A;  . ESOPHAGOGASTRODUODENOSCOPY (EGD) WITH PROPOFOL N/A 07/16/2019   Procedure: ESOPHAGOGASTRODUODENOSCOPY (EGD) WITH PROPOFOL;  Surgeon: Vida Rigger, MD;  Location: [facility];  Service: Endoscopy;  Laterality: N/A;  . ESOPHAGOGASTRODUODENOSCOPY (EGD) WITH PROPOFOL N/A 09/09/2019   Procedure: ESOPHAGOGASTRODUODENOSCOPY (EGD) WITH PROPOFOL;  Surgeon: Vida Rigger, MD;  Location: [facility];  Service: Endoscopy;  Laterality: N/A;  . FINE NEEDLE ASPIRATION  09/09/2019   Procedure: FINE NEEDLE ASPIRATION (FNA) LINEAR;  Surgeon: Vida Rigger, MD;  Location: [facility];  Service: Endoscopy;;  . FOREIGN BODY REMOVAL  07/16/2019   Procedure: FOREIGN BODY REMOVAL;  Surgeon: Vida Rigger, MD;  Location: [facility];  Service: Endoscopy;;  . SHOULDER SURGERY    . UPPER ESOPHAGEAL ENDOSCOPIC ULTRASOUND (EUS) N/A 09/09/2019   Procedure: UPPER ESOPHAGEAL ENDOSCOPIC ULTRASOUND (EUS);  Surgeon: [name], MD;  Location: [facility];  Service: Endoscopy;  Laterality: N/A;    Social History: Social History   Tobacco Use  . Smoking status: Current Every Day Smoker  Packs/day: 1.50    Years: 20.00    Pack years: 30.00    Types: Cigarettes  . Smokeless tobacco: Never Used  Substance Use Topics  . Alcohol use: Yes    Alcohol/week: 1.0 standard drink    Types: 1 Standard drinks or equivalent per week  . Drug use: Yes    Types: Cocaine   Additional social history: lives with wife, not working presently Please also refer to relevant sections of EMR.  Family History: No family history on file.  Allergies and Medications: No Known Allergies No current facility-administered medications on file prior to encounter.   Current Outpatient Medications on File Prior to Encounter  Medication Sig Dispense Refill  . aspirin EC 81 MG tablet Take 1 tablet (81 mg total) by mouth daily. Swallow whole. 30 tablet 11  . atorvastatin (LIPITOR) 80 MG tablet Take 1 tablet (80 mg  total) by mouth daily. 90 tablet 3  . buPROPion (WELLBUTRIN) 75 MG tablet One pill each night for three days.  One pill twice a day for three days.  One pill each morning and two pills each night thereafter. 90 tablet 3  . doxycycline (VIBRA-TABS) 100 MG tablet Take 1 tablet (100 mg total) by mouth 2 (two) times daily. 20 tablet 0  . gabapentin (NEURONTIN) 300 MG capsule Take 1 capsule (300 mg total) by mouth 3 (three) times daily. 90 capsule 3  . nicotine (NICODERM CQ - DOSED IN MG/24 HOURS) 14 mg/24hr patch Place 1 patch (14 mg total) onto the skin daily. 28 patch 0  . pantoprazole (PROTONIX) 40 MG tablet Take 1 tablet (40 mg total) by mouth 2 (two) times daily. 60 tablet 12    Objective: BP 111/88   Pulse 69   Temp 97.6 F (36.4 C)   Resp 18   SpO2 97%  Exam: General: Alert, cachectic appearing male, NAD Eyes: PERRL, EOMI ENTM: [place]/AT, mucous membranes somewhat dry Neck: Supple Cardiovascular: RRR, no murmurs, chest pain reproducible on palpation to the lower ribs Respiratory: Clear to auscultation bilaterally, breathing comfortably on room air Gastrointestinal: Soft, nontender MSK: No deformity Derm: Warm, dry, some tattoos noted Neuro: Alert, interactive Psych: affect appropriate  Labs and Imaging: CBC BMET  Recent Labs  Lab 05/23/20 2040  WBC 8.3  HGB 16.8  HCT 50.8  PLT 285   Recent Labs  Lab 05/23/20 2040  NA 133*  K 3.9  CL 93*  CO2 28  BUN 10  CREATININE 0.76  GLUCOSE 79  CALCIUM 9.8     DG Chest Port 1 View  Result Date: 05/23/2020 CLINICAL DATA:  Shortness of breath, pain. EXAM: PORTABLE CHEST 1 VIEW COMPARISON:  07/10/2019 FINDINGS: Heart and mediastinal contours are within normal limits. No focal opacities or effusions. No acute bony abnormality. IMPRESSION: No active cardiopulmonary disease. Electronically Signed   By: Charlett Nose M.D.   On: 05/23/2020 21:39     EKG: My own interpretation (not copied from electronic read) NSR, no QTC  prolongation  Littie Deeds, MD 05/24/2020, 1:29 AM PGY-1, [facility] Family Medicine FPTS Intern pager: [phone], text pages welcome  FPTS Upper-Level Resident Addendum   I have independently interviewed and examined the patient. I have discussed the above with the original author and agree with their documentation. My edits for correction/addition/clarification are in pink. Please see also any attending notes.   Shirlean Mylar, M.D. PGY-2, [facility] Family Medicine 05/24/2020 2:44 AM  FPTS Service pager: [phone] (text pages welcome through [facility])

## 2020-05-23 NOTE — Telephone Encounter (Signed)
Called and spoke to [facility].  Wt down  Marginal PO intake x 3 weeks.  Almost no PO intake for 3-4 days.  VERY weak.  Advised to go to ER.  Needs admit for iv hydration and nutrition.

## 2020-05-23 NOTE — ED Triage Notes (Signed)
Emergency Medicine Provider Triage Evaluation Note  Ralph [name] , a 55 y.o. male  was evaluated in triage.  Pt complains of dysphasia, choking when eating, weight loss and dehydration. Also with cough and sob.   Review of Systems  Positive: dysphasia, choking when eating, weight loss and dehydration. Also with cough and sob.  Negative: Chest pain  Physical Exam  BP (!) 135/110 (BP Location: Right Arm)   Pulse 74   Temp 97.6 F (36.4 C)   Resp 18   SpO2 100%  Gen:   Awake, no distress   HEENT:  Atraumatic  Resp:  Normal effort  Cardiac:  Normal rate  Abd:   Nondistended, epigastric ttp MSK:   Moves extremities without difficulty  Neuro:  Speech clear   Medical Decision Making  Medically screening exam initiated at 8:42 PM.  Appropriate orders placed.  MEL LANGAN was informed that the remainder of the evaluation will be completed by another provider, this initial triage assessment does not replace that evaluation, and the importance of remaining in the ED until their evaluation is complete.  Clinical Impression   55 y/o m with mult complaints  MSE was initiated and I personally evaluated the patient and placed orders (if any) at  8:42 PM on May 23, 2020.  The patient appears stable so that the remainder of the MSE may be completed by another provider.    Karrie Meres, [place] 05/23/20 2042

## 2020-05-23 NOTE — ED Provider Notes (Addendum)
[facility] EMERGENCY DEPARTMENT Provider Note   CSN: [id number] Arrival date & time: 05/23/20  1942     History Chief Complaint  Patient presents with  . Cough    SUVAN STCYR is a 55 y.o. male Patient has a history of esophagitis, esophageal dysphagia, CVA, MI.    Patient has chronic difficulty swallowing due to his esophagitis.  Presents today with chief complaint of worsening dysphagia.  Patient reports he has not been able to swallow any solids.  Patient reports increasing difficulty swallowing with liquids.   Patient is having increased difficulty swallowing boost.  Patient has not been able to eat any food in the last 4 days.   Patient endorses cough over the last 4 months.  Patient reports he is spitting up liquid when he coughs.  Cough is worsened when he attempts to eat or drink anything.  He also endorses shortness of breath with exertion over the same time..  Patient complains of thoracic back pain.  Patient rates his pain 9/10 on the pain scale.  Patient denies any alleviating or aggravating factors.  Nuys any recent falls or injuries.  Patient endorses hematuria over the last 2 days.  Denies any dysuria, urinary frequency, urgency, genital swelling or tenderness.  Patient denies any fever, chills, night sweats, chest pain, nausea, vomiting.  Patient reports 45-year smoking history.  Patient endorses drinking one 12 pack of beer a week.  Denies any drug use.  HPI     Past Medical History:  Diagnosis Date  . Coronary artery disease   . Myocardial infarction (HCC)   . Stroke [facility])     Patient Active Problem List   Diagnosis Date Noted  . Epididymitis 04/06/2020  . Right inguinal hernia 09/14/2019  . Depression, recurrent (HCC) 06/11/2019  . GERD with esophagitis 05/27/2019  . Bradycardia 05/04/2019  . Chest pain, unspecified 05/04/2019  . Does not have health insurance   . Dysphagia   . Hyponatremia   . Protein-calorie malnutrition (HCC)    . Tobacco use disorder   . Neuropathic pain 08/29/2017  . High risk social situation 08/29/2017  . 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  . Pure hypercholesterolemia 11/26/2012  . Cerebrovascular disease 11/25/2012  . Peripheral vascular disease, unspecified (HCC) 11/25/2012  . Coronary artery disease 11/25/2012  . Tobacco abuse counseling 11/25/2012  . Essential hypertension, benign 11/25/2012    Past Surgical History:  Procedure Laterality Date  . APPENDECTOMY    . BALLOON DILATION N/A 06/01/2019   Procedure: BALLOON DILATION;  Surgeon: Vida Rigger, MD;  Location: [facility] ENDOSCOPY;  Service: Endoscopy;  Laterality: N/A;  . BALLOON DILATION N/A 07/16/2019   Procedure: BALLOON DILATION;  Surgeon: Vida Rigger, MD;  [facility]: [facility] END[facility];  Service: Endoscopy;  Laterality: N/A;  . BALLOON DILATION N/A 09/09/2019   Procedure: BALLOON DILATION;  Surgeon: Vida Rigger, MD;  [facility]: [facility]OS[facility];  Service: Endoscopy;  Laterality: N/A;  . BIOPSY  04/29/2019   Procedure: BIOPSY;  Surgeon: Charlott Rakes, MD;  Location: [facility] ENDOSCOPY;  Service: Endoscopy;;  . BIOPSY  05/27/2019   Procedure: BIOPSY;  Surgeon: [name], MD;  [facility]: [facility];  Service: Endoscopy;;  . BIOPSY  06/01/2019   Procedure: BIOPSY;  Surgeon: Vida Rigger, MD;  [facility];  Service: Endoscopy;;  . BIOPSY  07/16/2019   Procedure: BIOPSY;  Surgeon: Vida Rigger, MD;  [facility];  Service: Endoscopy;;  . ESOPHAGOGASTRODUODENOSCOPY N/A 06/01/2019   Procedure: ESOPHAGOGASTRODUODENOSCOPY (EGD);  Surgeon: Ewing Schlein,  Vernia Buff, MD;  Location: [facility] ENDOSCOPY;  Service: Endoscopy;  Laterality: N/A;  . ESOPHAGOGASTRODUODENOSCOPY (EGD) WITH PROPOFOL N/A 04/29/2019   Procedure: ESOPHAGOGASTRODUODENOSCOPY (EGD) WITH PROPOFOL;  Surgeon: Charlott Rakes, MD;  Location: [facility] ENDOSCOPY;  Service: Endoscopy;  Laterality: N/A;  . ESOPHAGOGASTRODUODENOSCOPY (EGD) WITH PROPOFOL N/A 05/27/2019   Procedure:  ESOPHAGOGASTRODUODENOSCOPY (EGD) WITH PROPOFOL;  Surgeon: [name], MD;  [facility];  Service: Endoscopy;  Laterality: N/A;  . ESOPHAGOGASTRODUODENOSCOPY (EGD) WITH PROPOFOL N/A 07/16/2019   Procedure: ESOPHAGOGASTRODUODENOSCOPY (EGD) WITH PROPOFOL;  Surgeon: Vida Rigger, MD;  [facility];  Service: Endoscopy;  Laterality: N/A;  . ESOPHAGOGASTRODUODENOSCOPY (EGD) WITH PROPOFOL N/A 09/09/2019   Procedure: ESOPHAGOGASTRODUODENOSCOPY (EGD) WITH PROPOFOL;  Surgeon: Vida Rigger, MD;  [facility];  Service: Endoscopy;  Laterality: N/A;  . FINE NEEDLE ASPIRATION  09/09/2019   Procedure: FINE NEEDLE ASPIRATION (FNA) LINEAR;  Surgeon: Vida Rigger, MD;  [facility];  Service: Endoscopy;;  . FOREIGN BODY REMOVAL  07/16/2019   Procedure: FOREIGN BODY REMOVAL;  Surgeon: Vida Rigger, MD;  [facility];  Service: Endoscopy;;  . SHOULDER SURGERY    . UPPER ESOPHAGEAL ENDOSCOPIC ULTRASOUND (EUS) N/A 09/09/2019   Procedure: UPPER ESOPHAGEAL ENDOSCOPIC ULTRASOUND (EUS);  Surgeon: [name], MD;  Location: [facility] ENDOSCOPY;  Service: Endoscopy;  Laterality: N/A;       No family history on file.  Social History   Tobacco Use  . Smoking status: Current Every Day Smoker    Packs/day: 1.50    Years: 20.00    Pack years: 30.00    Types: Cigarettes  . Smokeless tobacco: Never Used  Substance Use Topics  . Alcohol use: Yes    Alcohol/week: 1.0 standard drink    Types: 1 Standard drinks or equivalent per week  . Drug use: Yes    Types: Cocaine    Home Medications Prior to Admission medications   Medication Sig Start Date End Date Taking? Authorizing Provider  aspirin EC 81 MG tablet Take 1 tablet (81 mg total) by mouth daily. Swallow whole. 09/14/19   Moses Manners, MD  atorvastatin (LIPITOR) 80 MG tablet Take 1 tablet (80 mg total) by mouth daily. 09/14/19   Moses Manners, MD  buPROPion (WELLBUTRIN) 75 MG tablet One pill each night for three  days.  One pill twice a day for three days.  One pill each morning and two pills each night thereafter. 04/06/20   Moses Manners, MD  doxycycline (VIBRA-TABS) 100 MG tablet Take 1 tablet (100 mg total) by mouth 2 (two) times daily. 04/06/20   Moses Manners, MD  gabapentin (NEURONTIN) 300 MG capsule Take 1 capsule (300 mg total) by mouth 3 (three) times daily. 09/14/19   Moses Manners, MD  nicotine (NICODERM CQ - DOSED IN MG/24 HOURS) 14 mg/24hr patch Place 1 patch (14 mg total) onto the skin daily. 07/10/19   Terald Sleeper, MD  pantoprazole (PROTONIX) 40 MG tablet Take 1 tablet (40 mg total) by mouth 2 (two) times daily. 04/06/20   Moses Manners, MD    Allergies    Patient has no known allergies.  Review of Systems   Review of Systems  Constitutional: Positive for appetite change (decreased) and fatigue. Negative for chills, fever and unexpected weight change.  HENT: Positive for trouble swallowing.   Eyes: Negative for visual disturbance.  Respiratory: Positive for cough, choking and shortness of breath.   Cardiovascular: Negative for chest pain.  Gastrointestinal: Positive for abdominal pain. Negative for  nausea and vomiting.  Genitourinary: Positive for hematuria. Negative for difficulty urinating, dysuria, flank pain, frequency, penile swelling, scrotal swelling, testicular pain and urgency.  Musculoskeletal: Positive for back pain. Negative for neck pain and neck stiffness.  Skin: Negative for color change and rash.  Neurological: Negative for dizziness, syncope, light-headedness and headaches.  Psychiatric/Behavioral: Negative for confusion.    Physical Exam Updated Vital Signs BP (!) 135/110 (BP Location: Right Arm)   Pulse 74   Temp 97.6 F (36.4 C)   Resp 18   SpO2 100%   Physical Exam Vitals and nursing note reviewed.  Constitutional:      General: He is not in acute distress.    Appearance: He is underweight. He is ill-appearing. He is not toxic-appearing or  diaphoretic.  HENT:     Head: Normocephalic and atraumatic.     Mouth/Throat:     Pharynx: Oropharynx is clear. Uvula midline. No pharyngeal swelling, oropharyngeal exudate, posterior oropharyngeal erythema or uvula swelling.     Comments: Patient able to handle oral secretions without difficulty Eyes:     General: No scleral icterus.       Right eye: No discharge.        Left eye: No discharge.  Cardiovascular:     Rate and Rhythm: Normal rate.     Heart sounds: Normal heart sounds.  Pulmonary:     Effort: Pulmonary effort is normal. No tachypnea, bradypnea or respiratory distress.     Breath sounds: Normal breath sounds. No stridor.  Abdominal:     General: Abdomen is flat. Bowel sounds are normal. There is no distension. There are no signs of injury.     Palpations: Abdomen is soft. There is no mass or pulsatile mass.     Tenderness: There is generalized abdominal tenderness.     Hernia: There is no hernia in the umbilical area or ventral area.  Musculoskeletal:     Cervical back: Full passive range of motion without pain, normal range of motion and neck supple.     Right lower leg: Normal.     Left lower leg: Normal.     Comments: No midline tenderness, cough, or deformity to cervical, thoracic, or lumbar spine  Patient has tenderness to bilateral thoracic paraspinal muscles.  Lymphadenopathy:     Cervical: No cervical adenopathy.  Skin:    General: Skin is warm and dry.     Coloration: Skin is pale. Skin is not jaundiced.  Neurological:     General: No focal deficit present.     Mental Status: He is alert.  Psychiatric:        Behavior: Behavior is cooperative.     ED Results / Procedures / Treatments   Labs (all labs ordered are listed, but only abnormal results are displayed) Labs Reviewed  COMPREHENSIVE METABOLIC PANEL - Abnormal; Notable for the following components:      Result Value   Sodium 133 (*)    Chloride 93 (*)    All other components within normal  limits  RESP PANEL BY RT-PCR (FLU A&B, COVID) [id number]  URINE CULTURE  CBC WITH DIFFERENTIAL/PLATELET  URINALYSIS, ROUTINE W REFLEX MICROSCOPIC    EKG EKG Interpretation  Date/Time:  Monday May 23 2020 20:43:59 EDT Ventricular Rate:  78 PR Interval:  136 QRS Duration: 88 QT Interval:  676 QTC Calculation: 770 R Axis:   83 Text Interpretation: Normal sinus rhythm Right atrial enlargement Cannot rule out Anterior infarct , age undetermined Prolonged QT Abnormal ECG  no STEMI Confirmed by Coralee PesaHorton, Kristie [phone]([id number]) on 05/23/2020 8:56:14 PM   Radiology No results found.  Procedures Procedures   Medications Ordered in ED Medications - No data to display  ED Course  I have reviewed the triage vital signs and the nursing notes.  Pertinent labs & imaging results that were available during my care of the patient were reviewed by me and considered in my medical decision making (see chart for details).    MDM Rules/Calculators/A&P                           Alert ill-appearing 55 year old male in no acute distress, nontoxic-appearing.  Patient presents with chief complaint plaint of dysphagia.  Patient has history of esophagitis and has had multiple esophageal dilations.  Patient reports dysphagia has worsened recently.  Patient unable to swallow solids.  Patient now having difficulty swallowing oral liquids.  Patient has not had any food in the last 4 days now fluid intake.  Chart review primary care provider Dr. Leveda AnnaHensel; requested patient to ED for admission he requires IV hydration and nutrition.  Given 1 L normal saline fluid bolus, IV thiamine, and protonix.   Patient's lungs are clear to auscultation bilaterally.  Chest x-ray showed no active cardiopulmonary disease.  Will obtain urinalysis to evaluate for patient's hematuria.  Low suspicion for renal calculi patient has no flank pain or other urinary symptoms.  CBC is unremarkable.  CMP shows sodium slightly decreased at 133 and  chloride slightly decreased at 93.  Patient given 1 L fluid bolus of normal saline, thiamine, and Protonix.  2345 Spoke to hospatilist who agreed to see the patient for admission.      Final Clinical Impression(s) / ED Diagnoses Final diagnoses:  Cough  Dysphagia, unspecified type    Rx / DC Orders ED Discharge Orders    None       Haskel SchroederBadalamente, Olamide Lahaie R, PA-C 05/24/20 0213    Haskel SchroederBadalamente, Acelyn Basham R, PA-C 05/24/20 0214    Tilden Fossaees, Elizabeth, MD 05/25/20 [phone]

## 2020-05-24 ENCOUNTER — Emergency Department (HOSPITAL_COMMUNITY): Payer: Medicaid Other

## 2020-05-24 DIAGNOSIS — R1310 Dysphagia, unspecified: Secondary | ICD-10-CM

## 2020-05-24 DIAGNOSIS — E871 Hypo-osmolality and hyponatremia: Secondary | ICD-10-CM

## 2020-05-24 DIAGNOSIS — K2100 Gastro-esophageal reflux disease with esophagitis, without bleeding: Secondary | ICD-10-CM

## 2020-05-24 DIAGNOSIS — E860 Dehydration: Secondary | ICD-10-CM

## 2020-05-24 LAB — BASIC METABOLIC PANEL
Anion gap: 6 (ref 5–15)
BUN: 8 mg/dL (ref 6–20)
CO2: 25 mmol/L (ref 22–32)
Calcium: 8.6 mg/dL — ABNORMAL LOW (ref 8.9–10.3)
Chloride: 101 mmol/L (ref 98–111)
Creatinine, Ser: 0.67 mg/dL (ref 0.61–1.24)
GFR, Estimated: >60 mL/min (ref >60)
Glucose, Bld: 113 mg/dL — ABNORMAL HIGH (ref 70–99)
Potassium: 3.3 mmol/L — ABNORMAL LOW (ref 3.5–5.1)
Sodium: 132 mmol/L — ABNORMAL LOW (ref 135–145)

## 2020-05-24 LAB — URINALYSIS, ROUTINE W REFLEX MICROSCOPIC
Bilirubin Urine: NEGATIVE
Glucose, UA: NEGATIVE mg/dL
Hgb urine dipstick: NEGATIVE
Ketones, ur: 20 mg/dL — AB
Leukocytes,Ua: NEGATIVE
Nitrite: NEGATIVE
Protein, ur: NEGATIVE mg/dL
Specific Gravity, Urine: >1.046 — ABNORMAL HIGH (ref 1.005–1.030)
pH: 6 (ref 5.0–8.0)

## 2020-05-24 LAB — CBC
HCT: 41.7 % (ref 39.0–52.0)
Hemoglobin: 14.2 g/dL (ref 13.0–17.0)
MCH: 32.7 pg (ref 26.0–34.0)
MCHC: 34.1 g/dL (ref 30.0–36.0)
MCV: 96.1 fL (ref 80.0–100.0)
Platelets: 210 10*3/uL (ref 150–400)
RBC: 4.34 MIL/uL (ref 4.22–5.81)
RDW: 14.6 % (ref 11.5–15.5)
WBC: 7.5 10*3/uL (ref 4.0–10.5)
nRBC: 0 % (ref 0.0–0.2)

## 2020-05-24 LAB — HIV ANTIBODY (ROUTINE TESTING W REFLEX): HIV Screen 4th Generation wRfx: NONREACTIVE

## 2020-05-24 MED ORDER — MORPHINE SULFATE (PF) 2 MG/ML IV SOLN
1.0000 mg | INTRAVENOUS | Status: DC | PRN
  Administered 2020-05-24 – 2020-05-28 (×36): 1 mg via INTRAVENOUS
  Filled 2020-05-24 (×36): qty 1

## 2020-05-24 MED ORDER — ENSURE ENLIVE PO LIQD
237.0000 mL | Freq: Four times a day (QID) | ORAL | Status: DC
  Administered 2020-05-24 – 2020-05-29 (×8): 237 mL via ORAL

## 2020-05-24 MED ORDER — MELATONIN 3 MG PO TABS: 3.0000 mg | ORAL_TABLET | Freq: Every evening | ORAL | Status: DC | PRN

## 2020-05-24 MED ORDER — IOHEXOL 300 MG/ML  SOLN
100.0000 mL | Freq: Once | INTRAMUSCULAR | Status: AC | PRN
  Administered 2020-05-24: 100 mL via INTRAVENOUS

## 2020-05-24 MED ORDER — MORPHINE SULFATE (PF) 2 MG/ML IV SOLN
1.0000 mg | INTRAVENOUS | Status: DC | PRN
Start: 2020-05-24 — End: 2020-05-24
  Administered 2020-05-24 (×5): 1 mg via INTRAVENOUS
  Filled 2020-05-24 (×5): qty 1

## 2020-05-24 MED ORDER — MORPHINE SULFATE (PF) 2 MG/ML IV SOLN
1.0000 mg | INTRAVENOUS | Status: DC | PRN
  Administered 2020-05-24: 1 mg via INTRAVENOUS
  Filled 2020-05-24: qty 1

## 2020-05-24 MED ORDER — ENSURE ENLIVE PO LIQD: 237.0000 mL | Freq: Three times a day (TID) | ORAL | Status: DC

## 2020-05-24 MED ORDER — PANTOPRAZOLE SODIUM 40 MG IV SOLR
40.0000 mg | Freq: Two times a day (BID) | INTRAVENOUS | Status: DC
  Administered 2020-05-24 – 2020-05-27 (×9): 40 mg via INTRAVENOUS
  Filled 2020-05-24 (×9): qty 40

## 2020-05-24 MED ORDER — ADULT MULTIVITAMIN W/MINERALS CH
1.0000 | ORAL_TABLET | Freq: Every day | ORAL | Status: DC
  Administered 2020-05-25 – 2020-05-26 (×2): 1 via ORAL
  Filled 2020-05-24 (×4): qty 1

## 2020-05-24 MED ORDER — DEXTROSE-NACL 5-0.9 % IV SOLN: INTRAVENOUS | Status: DC

## 2020-05-24 MED ORDER — SODIUM CHLORIDE 0.9 % IV SOLN
200.0000 mg | Freq: Once | INTRAVENOUS | Status: AC
  Administered 2020-05-24: 200 mg via INTRAVENOUS
  Filled 2020-05-24: qty 40

## 2020-05-24 MED ORDER — ACETAMINOPHEN 325 MG PO TABS
650.0000 mg | ORAL_TABLET | Freq: Four times a day (QID) | ORAL | Status: DC | PRN
  Administered 2020-05-29: 650 mg via ORAL
  Filled 2020-05-24: qty 2

## 2020-05-24 MED ORDER — POTASSIUM CHLORIDE 10 MEQ/100ML IV SOLN
10.0000 meq | INTRAVENOUS | Status: AC
  Administered 2020-05-24 (×4): 10 meq via INTRAVENOUS
  Filled 2020-05-24 (×4): qty 100

## 2020-05-24 MED ORDER — ENOXAPARIN SODIUM 40 MG/0.4ML ~~LOC~~ SOLN
40.0000 mg | Freq: Every day | SUBCUTANEOUS | Status: DC
  Administered 2020-05-24 – 2020-05-29 (×7): 40 mg via SUBCUTANEOUS
  Filled 2020-05-24 (×7): qty 0.4

## 2020-05-24 MED ORDER — DIPHENHYDRAMINE HCL 50 MG/ML IJ SOLN
12.5000 mg | Freq: Once | INTRAMUSCULAR | Status: AC | PRN
  Administered 2020-05-24: 12.5 mg via INTRAVENOUS
  Filled 2020-05-24: qty 1

## 2020-05-24 MED ORDER — ACETAMINOPHEN 650 MG RE SUPP: 650.0000 mg | Freq: Four times a day (QID) | RECTAL | Status: DC | PRN

## 2020-05-24 MED ORDER — SODIUM CHLORIDE 0.9 % IV SOLN
100.0000 mg | Freq: Every day | INTRAVENOUS | Status: AC
  Administered 2020-05-25 – 2020-05-26 (×2): 100 mg via INTRAVENOUS
  Filled 2020-05-24 (×2): qty 20

## 2020-05-24 MED ORDER — MAGIC MOUTHWASH W/LIDOCAINE
5.0000 mL | Freq: Four times a day (QID) | ORAL | Status: DC
  Administered 2020-05-24 – 2020-06-05 (×24): 5 mL via ORAL
  Filled 2020-05-24 (×54): qty 5

## 2020-05-24 MED ORDER — HYDROXYZINE HCL 10 MG PO TABS
10.0000 mg | ORAL_TABLET | Freq: Once | ORAL | Status: AC | PRN
  Filled 2020-05-24: qty 1

## 2020-05-24 MED ORDER — SUCRALFATE 1 GM/10ML PO SUSP
1.0000 g | Freq: Three times a day (TID) | ORAL | Status: DC
  Administered 2020-05-24 – 2020-05-30 (×21): 1 g via ORAL
  Filled 2020-05-24 (×22): qty 10

## 2020-05-24 MED ORDER — NICOTINE 14 MG/24HR TD PT24
14.0000 mg | MEDICATED_PATCH | Freq: Every day | TRANSDERMAL | Status: DC
  Administered 2020-05-24 – 2020-05-30 (×7): 14 mg via TRANSDERMAL
  Filled 2020-05-24 (×8): qty 1

## 2020-05-24 MED ORDER — PNEUMOCOCCAL VAC POLYVALENT 25 MCG/0.5ML IJ INJ
0.5000 mL | INJECTION | INTRAMUSCULAR | Status: DC
  Filled 2020-05-24: qty 0.5

## 2020-05-24 NOTE — ED Notes (Signed)
Attempted to call report

## 2020-05-24 NOTE — Progress Notes (Signed)
Initial Nutrition Assessment  DOCUMENTATION CODES:   Severe malnutrition in context of chronic illness,Underweight  INTERVENTION:    Ensure Enlive po QID, each supplement provides 350 kcal and 20 grams of protein  MVI with minerals daily  NUTRITION DIAGNOSIS:   Severe Malnutrition related to chronic illness (esophageal stricture) as evidenced by severe muscle depletion,severe fat depletion.  GOAL:   Patient will meet greater than or equal to 90% of their needs  MONITOR:   PO intake,Supplement acceptance  REASON FOR ASSESSMENT:   Consult Assessment of nutrition requirement/status  ASSESSMENT:   55 yo male admitted with dysphagia. PMH includes esophagitis s/p dilation, alcohol abuse, CAD, dysphagia, CVA, MI, HTN, HLD.   Patient reports a lot of weight loss d/t inability to swallow. He has been spitting up after he eats. He wants to have his esophagus dilated again, but unsure if he will be able to have this done this admission. He likes chocolate Ensure/Boost supplements.     S/P swallow evaluation with SLP today. Diet was advanced to dysphagia 1 with thin liquids. Patient's meal tray at bedside. Patient stated that he threw up/spit up after eating.   Labs reviewed. Na 132, K 3.3  Medications reviewed and include IV KCl, IV remdesivir. IVF: D5 NS at 125 ml/h   NUTRITION - FOCUSED PHYSICAL EXAM:  Flowsheet Row Most Recent Value  Orbital Region Severe depletion  Upper Arm Region Severe depletion  Thoracic and Lumbar Region Severe depletion  Buccal Region Severe depletion  Temple Region Severe depletion  Clavicle Bone Region Severe depletion  Clavicle and Acromion Bone Region Severe depletion  Scapular Bone Region Severe depletion  Dorsal Hand Moderate depletion  Patellar Region Unable to assess  Anterior Thigh Region Unable to assess  Posterior Calf Region Mild depletion  Edema (RD Assessment) None  Hair Reviewed  Eyes Reviewed  Mouth Reviewed  Skin Reviewed   Nails Reviewed       Diet Order:   Diet Order            Diet NPO time specified  Diet effective now                 EDUCATION NEEDS:   Education needs have been addressed  Skin:  Skin Assessment: Reviewed RN Assessment  Last BM:  no BM documented  Height:   Ht Readings from Last 1 Encounters:  05/24/20 5\' 9"  (1.753 m)    Weight:   Wt Readings from Last 1 Encounters:  05/24/20 54.9 kg    Ideal Body Weight:     BMI:  Body mass index is 17.87 kg/m.  Estimated Nutritional Needs:   Kcal:  1900-2100  Protein:  85-100 gm  Fluid:  >/= 1.8 L    05/26/20, RD, LDN, CNSC Please refer to Amion for contact information.

## 2020-05-24 NOTE — ED Notes (Signed)
Patient transported to CT scan . 

## 2020-05-24 NOTE — Evaluation (Signed)
Clinical/Bedside Swallow Evaluation Patient Details  Name: Ralph [name] MRN: [id number] Date of Birth: 1965/09/07  Today's Date: 05/24/2020 Time: SLP Start Time (ACUTE ONLY): 0840 SLP Stop Time (ACUTE ONLY): 0918 SLP Time Calculation (min) (ACUTE ONLY): 38 min  Past Medical History:  Past Medical History:  Diagnosis Date  . Coronary artery disease   . Myocardial infarction (HCC)   . Stroke [facility])    Past Surgical History:  Past Surgical History:  Procedure Laterality Date  . APPENDECTOMY    . BALLOON DILATION N/A 06/01/2019   Procedure: BALLOON DILATION;  Surgeon: Vida Rigger, MD;  [facility];  Service: Endoscopy;  Laterality: N/A;  . BALLOON DILATION N/A 07/16/2019   Procedure: BALLOON DILATION;  Surgeon: Vida Rigger, MD;  [facility];  Service: Endoscopy;  Laterality: N/A;  . BALLOON DILATION N/A 09/09/2019   Procedure: BALLOON DILATION;  Surgeon: Vida Rigger, MD;  [facility];  Service: Endoscopy;  Laterality: N/A;  . BIOPSY  04/29/2019   Procedure: BIOPSY;  Surgeon: Charlott Rakes, MD;  Location: [facility] ENDOSCOPY;  Service: Endoscopy;;  . BIOPSY  05/27/2019   Procedure: BIOPSY;  Surgeon: [name], MD;  [facility];  Service: Endoscopy;;  . BIOPSY  06/01/2019   Procedure: BIOPSY;  Surgeon: Vida Rigger, MD;  [facility];  Service: Endoscopy;;  . BIOPSY  07/16/2019   Procedure: BIOPSY;  Surgeon: Vida Rigger, MD;  [facility];  Service: Endoscopy;;  . ESOPHAGOGASTRODUODENOSCOPY N/A 06/01/2019   Procedure: ESOPHAGOGASTRODUODENOSCOPY (EGD);  Surgeon: Vida Rigger, MD;  Location: [facility] ENDOSCOPY;  Service: Endoscopy;  Laterality: N/A;  . ESOPHAGOGASTRODUODENOSCOPY (EGD) WITH PROPOFOL N/A 04/29/2019   Procedure: ESOPHAGOGASTRODUODENOSCOPY (EGD) WITH PROPOFOL;  Surgeon: Charlott Rakes, MD;  Location: [facility] ENDOSCOPY;  Service: Endoscopy;  Laterality: N/A;  . ESOPHAGOGASTRODUODENOSCOPY (EGD) WITH PROPOFOL N/A 05/27/2019    Procedure: ESOPHAGOGASTRODUODENOSCOPY (EGD) WITH PROPOFOL;  Surgeon: [name], MD;  [facility];  Service: Endoscopy;  Laterality: N/A;  . ESOPHAGOGASTRODUODENOSCOPY (EGD) WITH PROPOFOL N/A 07/16/2019   Procedure: ESOPHAGOGASTRODUODENOSCOPY (EGD) WITH PROPOFOL;  Surgeon: Vida Rigger, MD;  [facility];  Service: Endoscopy;  Laterality: N/A;  . ESOPHAGOGASTRODUODENOSCOPY (EGD) WITH PROPOFOL N/A 09/09/2019   Procedure: ESOPHAGOGASTRODUODENOSCOPY (EGD) WITH PROPOFOL;  Surgeon: Vida Rigger, MD;  [facility];  Service: Endoscopy;  Laterality: N/A;  . FINE NEEDLE ASPIRATION  09/09/2019   Procedure: FINE NEEDLE ASPIRATION (FNA) LINEAR;  Surgeon: Vida Rigger, MD;  [facility];  Service: Endoscopy;;  . FOREIGN BODY REMOVAL  07/16/2019   Procedure: FOREIGN BODY REMOVAL;  Surgeon: Vida Rigger, MD;  [facility];  Service: Endoscopy;;  . SHOULDER SURGERY    . UPPER ESOPHAGEAL ENDOSCOPIC ULTRASOUND (EUS) N/A 09/09/2019   Procedure: UPPER ESOPHAGEAL ENDOSCOPIC ULTRASOUND (EUS);  Surgeon: [name], MD;  Location: [facility] ENDOSCOPY;  Service: Endoscopy;  Laterality: N/A;   HPI:  Pt is a 55 y.o. male with history of esophagitis s/p dilation, alcohol use, tobacco use disorder, CAD, esophageal dysphagia, CVA (2018), MI, HTN, HLD.   He has chronic difficulty swallowing due to his esophagitis and presented to ED with chief complaint of worsening dysphagia.  Pt reports he has not been able to swallow any solids and is having increased difficulty swallowing liquids. He has not been able to eat any food in the last 4 days prior to admission. Additionally, he endorses cough over the last 4 months and spitting up liquid when he coughs.  He states that this cough is worsened when he attempts to eat  or drink anything which is accompanied by shortness of breath with exertion over the same time. MBSS (2018)- "mild oral dysphagia with increased mastication time and trace oral  residue with solid. Pre-swallow loss to pharynx observed secondary to decreased lingual control". Pharyngeal phase WFL. Recommended mechanical soft diet, thin liquids at that time. CT chest (05/24/20) revealed "No acute abnormality of the chest, abdomen or pelvis. Patulous, debris-filled esophagus with focal stenosis at the level of the right pulmonary veins". Concern for GI malignancy. COVID positive (05/23/20).   Assessment / Plan / Recommendation Clinical Impression  Pt seen for bedside evaluation alert and upright in bed. He was able to provide hx in regards to dysphagia with impact of esophageal dysfunction. Oral mechanism examination WFL, except for poor and missing dentition.Trials of thin liquids, pureed and regular textured solids consumed with primary clinical s/sx of suspected esophageal dysphagia, including frequent belching, reports of globus sensation (mid sternal region) and ultimate regurgitation and expectoration of POs in small amounts. Larger bolus sizes, consumed at fast rate increased clincial s/sx. No indication of aspiration observed at bedside. Pt desires to be on oral diet and recommend dysphagia 1, thin liquid diet with the following basic aspiration/reflux precautions observed: upright positioning, remain upright 30 minutes after intake, small bites/sips, slow rate, take breaks as needed. Suspect exacerbation of chronic esophageal dysphagia and recommend GI work up with SLP services to f/u if indicated. Will s/o.  SLP Visit Diagnosis: Dysphagia, unspecified (R13.10)    Aspiration Risk  Mild aspiration risk    Diet Recommendation Dysphagia 1 (Puree);Thin liquid   Liquid Administration via: Straw;Cup Medication Administration: Crushed with puree Supervision: Patient able to self feed Compensations: Slow rate;Small sips/bites Postural Changes: Seated upright at 90 degrees;Remain upright for at least 30 minutes after po intake    Other  Recommendations Recommended Consults:  Consider GI evaluation Oral Care Recommendations: Oral care BID   Follow up Recommendations None      Frequency and Duration            Prognosis        Swallow Study   General Date of Onset: 05/23/20 HPI: Pt is a 55 y.o. male with history of esophagitis s/p dilation, alcohol use, tobacco use disorder, CAD, esophageal dysphagia, CVA (2018), MI, HTN, HLD.   He has chronic difficulty swallowing due to his esophagitis and presented to ED with chief complaint of worsening dysphagia.  Pt reports he has not been able to swallow any solids and is having increased difficulty swallowing liquids. He has not been able to eat any food in the last 4 days prior to admission. Additionally, he endorses cough over the last 4 months and spitting up liquid when he coughs.  He states that this cough is worsened when he attempts to eat or drink anything which is accompanied by shortness of breath with exertion over the same time. MBSS (2018)- "mild oral dysphagia with increased mastication time and trace oral residue with solid. Pre-swallow loss to pharynx observed secondary to decreased lingual control". Pharyngeal phase WFL. Recommended mechanical soft diet, thin liquids at that time. CT chest (05/24/20) revealed "No acute abnormality of the chest, abdomen or pelvis. Patulous, debris-filled esophagus with focal stenosis at the level of the right pulmonary veins". Concern for GI malignancy. COVID positive (05/23/20). Type of Study: Bedside Swallow Evaluation Previous Swallow Assessment:  (See HPI) Diet Prior to this Study: Regular;Thin liquids Temperature Spikes Noted: No Respiratory Status: Room air History of Recent Intubation: No Behavior/Cognition: Alert;Cooperative;Pleasant mood  Oral Cavity Assessment: Within Functional Limits;Dry Oral Care Completed by SLP: No Oral Cavity - Dentition: Edentulous;Missing dentition Vision: Functional for self-feeding Self-Feeding Abilities: Able to feed self Patient  Positioning: Upright in bed;Postural control adequate for testing Baseline Vocal Quality: Normal Volitional Cough: Strong Volitional Swallow: Able to elicit    Oral/Motor/Sensory Function Overall Oral Motor/Sensory Function: Within functional limits   Ice Chips Ice chips: Not tested   Thin Liquid Thin Liquid: Impaired Presentation: Straw;Cup Pharyngeal  Phase Impairments: Other (comments) (belching, mild expectoration of mucus/liquids, globus sensation)    Nectar Thick Nectar Thick Liquid: Not tested   Honey Thick Honey Thick Liquid: Not tested   Puree Puree: Within functional limits Presentation: Spoon;Self Fed Other Comments:  (belching, mild expectoration of mucus/liquids, globus sensation)   Solid     Solid: Within functional limits Presentation: Self Fed Other Comments:  (belching, mild expectoration of mucus/liquids, globus sensation)     Avie Echevaria, MA, CCC-SLP Acute Rehabilitation Services Office Number: [phone]  Paulette Blanch 05/24/2020,10:14 AM

## 2020-05-24 NOTE — Progress Notes (Addendum)
Family Medicine Teaching Service Daily Progress Note Intern Pager: [phone]  Patient name: Ralph [name] Medical record number: [id number] Date of birth: 1965/05/09 Age: 55 y.o. Gender: male  Primary Care Provider: Moses Manners, MD Consultants: GI Code Status: Full  Pt Overview and Major Events to Date:  4/18: Admitted   Assessment and Plan: Ralph [name] is a 55 y.o. male presenting with dysphagia and weightloss. PMH is significant for gastritis and esophageal stricture, CVA, tobacco use, HTN, HLD.  Dysphagia  Weight loss  Concern for GI malignancy  Patient has extensive GI history. EGD with [place] in August 2021 that demonstrated submucosal thickening of distal esophagus and reactive LNs. An FNA of the esophageal thickening was performed, LNs could not be reached; however the cytology of the FNA from 09/09/19 FNA is not available in epic. Previous FNA of distal esophagus on 07/16/19 was negative for malignancy and c/w gastritis; biopsy of distal esophagus on 05/27/19 and 06/01/19 negative for malignancy, c/w esophageal candidiasis. Biopsy of distal esophagus on 04/29/19 c/w squamocolumnar junction with focal intestinal metaplasia, negative for malignancy. CT abdomen/pelvis demonstrated no acute abnormality of the chest, abdomen or pelvis. Speech evaluation completed, recommended dysphagia 1 diet. Also noted to have patulous, debris filled esophagus with focal stenosis at the level of the right pulmonary veins. Patient previously followed up with [facility] but after calling the office today, determined that patient has been dismissed from the practice in 2021.  -pending GI consult, I have paged on-call GI provider [phone] multiple times, awaiting response, appreciate recommendations - mIVF D5NS 125 ml/hr - vitals per unit routine - NPO pending possible GI procedure then dysphagia 1 diet  - IV protonix 40 mg bid  - Up with assistance  COVID-19 infection Incidental finding on admission.  Patient endorsing rhinorrhea. CXR demonstrated no active cardiopulmonary disease.  - remdesivir (day 1/3) - airborne/droplet precautions - continuous pulse ox x 24 hr  Chest pain Likely secondary to esophageal history.  - IV morphine 1 mg q2h prn - consider CTA chest if clinically worsening  Hematuria Noted prior to admission on two separate occasions. UA negative for Hgb.  - pending urine culture  Cachexia  Protein calorie malnutrition Weight loss in the setting of poor oral intake.  - nutrition consulted, appreciate recs   Hyponatremia Na 132. - monitor BMP - monitor for mental status changes  - mIVF D5NS 125 ml/hr  Hypokalemia K 3.3. - IV KCl 40 mEq - am BMP  QTc prolongation Prior reading 770. - avoid QTc prolonging medications - monitor EKG  History of  HTN Normotensive this morning, BP 136/86. Not on any home antihypertensives. - monitor BP  HLD On atorvastatin, though appears patient is not taking this. - hold home medications given NPO  CVA Patient has history of stroke in 2018 with residual left-sided weakness. Home medications include atorvastatin and ASA, though appears patient is not taking these. - hold home medications given NPO   Right inguinal hernia Patient has known hernia, diagnosed with varicocele at [facility] recently. He saw Dr. Leveda Anna in March and was treated with doxycycline.  Patient states that hernia has resolved and he does not have any pain in the area.  Tobacco use disorder Patient reports smoking 1PPD since age of 89, recently cut back to 1/2PPD.  - nicotine patch - consider restarting bupropion   Alcohol use He reports that he drinks 1-2 40 oz beers per night. Last drink 4/18. Denies history of alcohol withdrawal. Recent CIWAs 2>1>3. -  monitor CIWA - thiamine   MDD  Prescribed bupropion but is not taking at home. - consider restarting bupropion outpatient   FEN/GI: NPO pending GI evaluation  PPx: lovenox     Status is: Observation  The patient remains OBS appropriate and will d/c before 2 midnights.  Dispo: The patient is from: Home              Anticipated d/c is to: Home              Patient currently is not medically stable to d/c.   Difficult to place patient No        Subjective:  Patient still endorsing dysphagia but overall symptomatic from COVID.   Objective: Temp:  [97.6 F (36.4 C)-98.2 F (36.8 C)] 98 F (36.7 C) (04/19 0433) Pulse Rate:  [59-74] 61 (04/19 0433) Resp:  [16-27] 20 (04/19 0433) BP: (107-135)/(72-110) 121/72 (04/19 0433) SpO2:  [97 %-100 %] 97 % (04/19 0433) Weight:  [54.9 kg] 54.9 kg (04/19 0315) Physical Exam: General: Patient sitting upright in bed, in no acute distress. Mouth: no buccal ulcers or lesions noted, no erythema noted Cardiovascular: RRR, no murmurs or gallops auscultated  Respiratory: CTAB, no wheezing noted Abdomen: soft, generalized abdominal tenderness on deep palpation, BS+ Extremities: radial and distal pulses strong and equal bilaterally, no LE edema noted  Neuro: AOx4, appropriately conversational Psych: mood appropriate   Laboratory: Recent Labs  Lab 05/23/20 2040 05/24/20 0502  WBC 8.3 7.5  HGB 16.8 14.2  HCT 50.8 41.7  PLT 285 210   Recent Labs  Lab 05/23/20 2040 05/24/20 0502  NA 133* 132*  K 3.9 3.3*  CL 93* 101  CO2 28 25  BUN 10 8  CREATININE 0.76 0.67  CALCIUM 9.8 8.6*  PROT 7.2  --   BILITOT 1.2  --   ALKPHOS 88  --   ALT 12  --   AST 20  --   GLUCOSE 79 113*      Imaging/Diagnostic Tests: CT CHEST W CONTRAST  Result Date: 05/24/2020 CLINICAL DATA:  Chest and flank pain.  Worsening dysphagia. EXAM: CT CHEST, ABDOMEN, AND PELVIS WITH CONTRAST TECHNIQUE: Multidetector CT imaging of the chest, abdomen and pelvis was performed following the standard protocol during bolus administration of intravenous contrast. CONTRAST:  OMNIPAQUE IOHEXOL 300 MG/ML  SOLN COMPARISON:  None. FINDINGS: CT  CHEST FINDINGS Cardiovascular: Heart size is normal. Normal aortic branching pattern. No pericardial effusion. Mediastinum/Nodes: Patulous esophagus is debris-filled with an area of focal narrowing at the level of the right pulmonary veins. (Coronal image 81). No mediastinal lymphadenopathy. Normal thyroid. No axillary adenopathy. Lungs/Pleura: Emphysema. No pleural effusion. No nodules or masses. Musculoskeletal: No chest wall mass or suspicious bone lesions identified. CT ABDOMEN PELVIS FINDINGS Hepatobiliary: No focal liver abnormality is seen. No gallstones, gallbladder wall thickening, or biliary dilatation. Pancreas: Unremarkable. No pancreatic ductal dilatation or surrounding inflammatory changes. Spleen: Normal in size without focal abnormality. Adrenals/Urinary Tract: Adrenal glands are unremarkable. Kidneys are normal, without renal calculi, focal lesion, or hydronephrosis. Bladder is unremarkable. Stomach/Bowel: Stomach is within normal limits. No evidence of bowel wall thickening, distention, or inflammatory changes. Vascular/Lymphatic: Aortic atherosclerosis. No enlarged abdominal or pelvic lymph nodes. Reproductive: Prostate is unremarkable. Other: No abdominal wall hernia or abnormality. No abdominopelvic ascites. Musculoskeletal: No acute or significant osseous findings. IMPRESSION: 1. No acute abnormality of the chest, abdomen or pelvis. 2. Patulous, debris-filled esophagus with focal stenosis at the level of the right pulmonary veins. Aortic  Atherosclerosis (ICD10-I70.0) and Emphysema (ICD10-J43.9). Electronically Signed   By: Deatra Robinson M.D.   On: 05/24/2020 01:48   CT ABDOMEN PELVIS W CONTRAST  Result Date: 05/24/2020 CLINICAL DATA:  Chest and flank pain.  Worsening dysphagia. EXAM: CT CHEST, ABDOMEN, AND PELVIS WITH CONTRAST TECHNIQUE: Multidetector CT imaging of the chest, abdomen and pelvis was performed following the standard protocol during bolus administration of intravenous  contrast. CONTRAST:  OMNIPAQUE IOHEXOL 300 MG/ML  SOLN COMPARISON:  None. FINDINGS: CT CHEST FINDINGS Cardiovascular: Heart size is normal. Normal aortic branching pattern. No pericardial effusion. Mediastinum/Nodes: Patulous esophagus is debris-filled with an area of focal narrowing at the level of the right pulmonary veins. (Coronal image 81). No mediastinal lymphadenopathy. Normal thyroid. No axillary adenopathy. Lungs/Pleura: Emphysema. No pleural effusion. No nodules or masses. Musculoskeletal: No chest wall mass or suspicious bone lesions identified. CT ABDOMEN PELVIS FINDINGS Hepatobiliary: No focal liver abnormality is seen. No gallstones, gallbladder wall thickening, or biliary dilatation. Pancreas: Unremarkable. No pancreatic ductal dilatation or surrounding inflammatory changes. Spleen: Normal in size without focal abnormality. Adrenals/Urinary Tract: Adrenal glands are unremarkable. Kidneys are normal, without renal calculi, focal lesion, or hydronephrosis. Bladder is unremarkable. Stomach/Bowel: Stomach is within normal limits. No evidence of bowel wall thickening, distention, or inflammatory changes. Vascular/Lymphatic: Aortic atherosclerosis. No enlarged abdominal or pelvic lymph nodes. Reproductive: Prostate is unremarkable. Other: No abdominal wall hernia or abnormality. No abdominopelvic ascites. Musculoskeletal: No acute or significant osseous findings. IMPRESSION: 1. No acute abnormality of the chest, abdomen or pelvis. 2. Patulous, debris-filled esophagus with focal stenosis at the level of the right pulmonary veins. Aortic Atherosclerosis (ICD10-I70.0) and Emphysema (ICD10-J43.9). Electronically Signed   By: Deatra Robinson M.D.   On: 05/24/2020 01:48   DG Chest Port 1 View  Result Date: 05/23/2020 CLINICAL DATA:  Shortness of breath, pain. EXAM: PORTABLE CHEST 1 VIEW COMPARISON:  07/10/2019 FINDINGS: Heart and mediastinal contours are within normal limits. No focal opacities or  effusions. No acute bony abnormality. IMPRESSION: No active cardiopulmonary disease. Electronically Signed   By: Charlett Nose M.D.   On: 05/23/2020 21:39    Reece Leader, DO 05/24/2020, 8:00 AM PGY-1, [facility] FPTS Intern pager: [phone], text pages welcome

## 2020-05-24 NOTE — Plan of Care (Signed)
  Problem: Education: Goal: Knowledge of General Education information will improve Description: Including pain rating scale, medication(s)/side effects and non-pharmacologic comfort measures Outcome: Progressing   Problem: Health Behavior/Discharge Planning: Goal: Ability to manage health-related needs will improve Outcome: Progressing   Problem: Pain Managment: Goal: General experience of comfort will improve Outcome: Progressing   Problem: Clinical Measurements: Goal: Diagnostic test results will improve Outcome: Not Progressing   Problem: Nutrition: Goal: Adequate nutrition will be maintained Outcome: Not Progressing

## 2020-05-24 NOTE — Consult Note (Addendum)
Referring Provider:  Family Medicine Teaching Service.         Primary Care Physician:  Moses Manners, MD Primary Gastroenterologist:  Enid Baas, ? Dismissed from [facility]           We were asked to see this patient for:    Dysphagia   Attending physician's note   I have taken a history, examined the patient and reviewed the chart. I agree with the Advanced Practitioner's note, impression and recommendations.  55 year old with history of chronic GERD, severe erosive esophagitis with chronic dysphagia  COVID-positive  Last EGD in August 2021 by Dr. Ewing Schlein, was dilated with TTS balloon to 18 mm Esophageal biopsies negative for eosinophilic esophagitis, dysplasia or malignancy He had a intramural nodule in the distal esophagus  Patient is tolerating soft diet but he continues to regurgitate with both liquids and solids.  He complains of burning sensation when he tries to bring or eat something  CT chest showed patulous esophagus filled with debris  Will obtain barium esophagram to further characterize esophageal dysmotility and distal esophageal stenosis if its intramucosal or extrinsic compression  Magic mouthwash with lidocaine swish and swallow every 6 hours as needed Carafate before meals and at bedtime Antireflux measures  Patient is unaware that he was dismissed from [facility] practice, it is unclear if he was formally dismissed or the reason for dismissal.  Will verify with [facility] tomorrow  The patient was provided an opportunity to ask questions and all were answered. The patient agreed with the plan and demonstrated an understanding of the instructions.  Ralph [name] , MD [phone]                ASSESSMENT / PLAN:   #55 year old male with Barrett's esophagus / chronic dysphagia /  history of esophageal stricture requiring balloon dilation ( last one Aug 2021 when dilated to 18 mm). No evidence for esophageal malignancy on repeated biopsies. No evidence for  eosinophilic esophagitis. Admitted with weight loss and progressive dysphagia to solids and liquids over last few weeks. CT scan showes patulous, debris filled esophagus with focal stenosis at level of right pulmonary vein.  --Magic mouthwash, carafate.  --Will obtain barium swallow ( ON Thursday) with thin contrast to assess for recurrent / persistent stricture but also to evaluate for component of esophageal dysmotility. Depending on results he may need esophageal manometry.   --Continue BID IV PPI  --SLP has recommended Dysphagia 1 diet but patient tells me he cannot even tolerate water without regurgitation  # COVID19, asymptomatic  # Electrolyte abnormalities most likely from decreased PO intake.   # QT prolongation.    HPI:                                                                                                                             Chief Complaint:  Cannot swallow  ISHMEL [name] is a 55 y.o. male with  a past medical history of CVA, remote MI, erosive esophagitis /j esophageal stricture., emphysema, COVID19  Patient has been followed by [facility] for severe esophagitis / esophageal stenosis requiring repeated dilations over the last year. There has been a suspicion for esophageal cancer but biopsies have shown been repeatedly negative for malignancy showing only Barrett's without dysplasia and also candida. He was found to have an esophageal nodule at time of EGD in April 2021. Nodule was negative for cancer.  He underwent EUS in August 2021 for evaluation of abnormal chest CT ( new lung nodule) and an esophageal stricture. No cancer found, cytology performed anyway and negative for malignancy. 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   Patient says that he got relief following last esophageal dilation in August 2021. Then approximately 4 weeks ago he began having intermittent solid food dysphagia which progressed to inability to tolerate solids. He was trying to  maintain nutrition by drinking Ensure. Dysphagia progressed to inability to tolerate thin liquids. Patient says he regurgitates water within a few minutes of drinking it. He is able to tolerate secretion. He has lost approx. 13 pounds over the last years. He reports compliance with BID Protonix. No GERD symptoms. No problems with BMs. He is having less BMs due to decreased PO intake. Patient says he had a colonoscopy several months ago with [facility].    PREVIOUS ENDOSCOPIC EVALUATIONS / PERTINENT STUDIES   March 2021 EGD --Severe ulcerative esophagitis with mass in distal esophagus concerning for malignancy. A. ESOPHAGUS, DISTAL MASS, BIOPSY:  - Squamocolumnar junction with focal intestinal metaplasia  - No dysplasia or malignancy identified  - See comment    05/27/19 EGD -Moderately severe esophagitis with no bleeding. Biopsied. - Very tight, ~3 cm diameter, distal esophageal stricture. - Feeding difficulties (inability to tolerate solids or liquids). A. ESOPHAGUS, DISTAL, STRICTURE, BIOPSY:  - Candidal esophagitis  - Negative for dysplasia or malignancy in the submitted biopsies  - See comment   06/01/19 EGD --Normal larynx. - Moderately severe reflux esophagitis with no bleeding. - Benign-appearing esophageal stenosis. Dilated to 10 mm. - Small hiatal hernia. - Submucosal nodule found in the esophagus. Biopsied. - Normal stomach. - Normal duodenal bulb and first portion of the duodenum. - The examination was otherwise normal.  A. GE JUNCTION, BIOPSY:  - Gastroesophageal junction mucosa with reactive/regenerative changes.  - Negative for intestinal metaplasia (goblet cell metaplasia).  - No submucosa present for evaluation.   07/16/19 EGD  --Increase debris. - Food and pills in the esophagus. Removal was successful. - Extrinsic narrowing of the esophagus. Dilated. Biopsied. - Small hiatal hernia. - Normal stomach. - Normal duodenal bulb, first portion of the duodenum and  second portion of the duodenum. - The examination was otherwise normal.  . ESOPHAGUS, DISTAL, BIOPSY:  - Esophageal squamous and cardiac mucosa with patchy, mildly active  chronic nonspecific esophagitis, see comment  - Negative for intestinal metaplasia, dysplasia or malignancy    July 2021 Chest CT scan 1. Esophagus remains diffusely patulous, filled with fluid, food, and contrast material. Stomach is distended with similar contents. Material in the esophageal lumen may be related to dysmotility or reflux. The marked circumferential wall thickening noted previously in the distal esophagus has decreased but not resolved. 2. No lymphadenopathy in the chest or abdomen. Upper normal lymph nodes in the mediastinum are stable. Index gastrohepatic ligament lymph node seen on the previous study is not evident today. 3. 6 mm nodule posterior left lower lobe is new  in the interval. Potentially infectious/inflammatory. Non-contrast chest CT at 6-12 months is recommended. If the nodule is stable at time of repeat CT, then future CT at 18-24 months (from today's scan) is considered optional for low-risk patients, but is recommended for high-risk patients. This recommendation follows the consensus statement: Guidelines for Management of Incidental Pulmonary Nodules Detected on CT Images: From the Fleischner Society 2017; Radiology 2017; 284:228-243. 4.  Emphysema (ICD10-J43.9) and Aortic Atherosclerosis (ICD10-170.0)  09/09/19 EGD --Medium-sized hiatal hernia. - Benign-appearing esophageal stenosis. Dilated. - LA Grade D reflux esophagitis with no bleeding. - Normal stomach. - Normal duodenal bulb, first portion of the duodenum and second portion of the duodenum. - The examination was otherwise normal. - No specimens collected.  09/09/19 EUS for abnormal CT scan , stricture --Wall thickening was seen in the middle third of the esophagus and in the lower third of the esophagus. The  thickening appeared to primarily be within the luminal interface/superficial mucosa (Layer 1). Fine needle aspiration performed. - A few lymph nodes were visualized and measured in the middle paraesophageal mediastinum (level 72M) and lower paraesophageal mediastinum (level 8L). Most likely reactive; would have to traverse esophageal thickening with needle to reach the LN, thus did not do FNA, given concern to rule out tumor in distal esophageal submucosa. - There was no evidence of significant pathology in the left lobe of the liver. - There was no sign of significant pathology in the genu of the pancreas, pancreatic body and pancreatic tail. - Overall clinical impression most consistent with refractory peptic stricture, but given persistence of symptoms and stricture, FNA biopsies were obtained to assess for submucosal tumor.  FINAL MICROSCOPIC DIAGNOSIS:  - No malignant cells identified     Past Medical History:  Diagnosis Date  . Coronary artery disease   . Myocardial infarction (HCC)   . Stroke [facility](HCC)     Past Surgical History:  Procedure Laterality Date  . APPENDECTOMY    . BALLOON DILATION N/A 06/01/2019   Procedure: BALLOON DILATION;  Surgeon: Vida RiggerMagod, Marc, MD;  Location: [facility];  Service: Endoscopy;  Laterality: N/A;  . BALLOON DILATION N/A 07/16/2019   Procedure: BALLOON DILATION;  Surgeon: Vida RiggerMagod, Marc, MD;  Location: [facility];  Service: Endoscopy;  Laterality: N/A;  . BALLOON DILATION N/A 09/09/2019   Procedure: BALLOON DILATION;  Surgeon: Vida RiggerMagod, Marc, MD;  Location: [facility];  Service: Endoscopy;  Laterality: N/A;  . BIOPSY  04/29/2019   Procedure: BIOPSY;  Surgeon: Charlott RakesSchooler, Vincent, MD;  [facility];  Service: Endoscopy;;  . BIOPSY  05/27/2019   Procedure: BIOPSY;  Surgeon: Willis Modenautlaw, William, MD;  Location: [facility];  Service: Endoscopy;;  . BIOPSY  06/01/2019   Procedure: BIOPSY;  Surgeon: Vida RiggerMagod, Marc, MD;  Location: [facility];  Service:  Endoscopy;;  . BIOPSY  07/16/2019   Procedure: BIOPSY;  Surgeon: Vida RiggerMagod, Marc, MD;  Location: [facility];  Service: Endoscopy;;  . ESOPHAGOGASTRODUODENOSCOPY N/A 06/01/2019   Procedure: ESOPHAGOGASTRODUODENOSCOPY (EGD);  Surgeon: Vida RiggerMagod, Marc, MD;  [facility];  Service: Endoscopy;  Laterality: N/A;  . ESOPHAGOGASTRODUODENOSCOPY (EGD) WITH PROPOFOL N/A 04/29/2019   Procedure: ESOPHAGOGASTRODUODENOSCOPY (EGD) WITH PROPOFOL;  Surgeon: Charlott RakesSchooler, Vincent, MD;  [facility];  Service: Endoscopy;  Laterality: N/A;  . ESOPHAGOGASTRODUODENOSCOPY (EGD) WITH PROPOFOL N/A 05/27/2019   Procedure: ESOPHAGOGASTRODUODENOSCOPY (EGD) WITH PROPOFOL;  Surgeon: Willis Modenautlaw, William, MD;  Location: [facility];  Service: Endoscopy;  Laterality: N/A;  . ESOPHAGOGASTRODUODENOSCOPY (EGD) WITH PROPOFOL N/A 07/16/2019   Procedure: ESOPHAGOGASTRODUODENOSCOPY (EGD) WITH PROPOFOL;  Surgeon:  Vida Rigger, MD;  [facility];  Service: Endoscopy;  Laterality: N/A;  . ESOPHAGOGASTRODUODENOSCOPY (EGD) WITH PROPOFOL N/A 09/09/2019   Procedure: ESOPHAGOGASTRODUODENOSCOPY (EGD) WITH PROPOFOL;  Surgeon: Vida Rigger, MD;  Location: [facility];  Service: Endoscopy;  Laterality: N/A;  . FINE NEEDLE ASPIRATION  09/09/2019   Procedure: FINE NEEDLE ASPIRATION (FNA) LINEAR;  Surgeon: Vida Rigger, MD;  Location: [facility];  Service: Endoscopy;;  . FOREIGN BODY REMOVAL  07/16/2019   Procedure: FOREIGN BODY REMOVAL;  Surgeon: Vida Rigger, MD;  Location: [facility];  Service: Endoscopy;;  . SHOULDER SURGERY    . UPPER ESOPHAGEAL ENDOSCOPIC ULTRASOUND (EUS) N/A 09/09/2019   Procedure: UPPER ESOPHAGEAL ENDOSCOPIC ULTRASOUND (EUS);  Surgeon: [name], MD;  Location[facility];  Service: Endoscopy;  Laterality: N/A;    Prior to Admission medications   Medication Sig Start Date End Date Taking? Authorizing Provider  atorvastatin (LIPITOR) 80 MG tablet Take 1 tablet (80 mg total) by mouth daily. 09/14/19  Yes [name],  [name], MD  gabapentin (NEURONTIN) 300 MG capsule Take 1 capsule (300 mg total) by mouth 3 (three) times daily. 09/14/19  Yes [name], [name], MD  pantoprazole (PROTONIX) 40 MG tablet Take 1 tablet (40 mg total) by mouth 2 (two) times daily. 04/06/20  Yes [name], [name], MD  polyvinyl alcohol (LIQUIFILM TEARS) 1.4 % ophthalmic solution Place 1 drop into both eyes as needed for dry eyes.   Yes [provider]  aspirin EC 81 MG tablet Take 1 tablet (81 mg total) by mouth daily. Swallow whole. Patient not taking: Reported on 05/24/2020 09/14/19   Moses Manners, MD  buPROPion [facility]) 75 MG tablet One pill each night for three days.  One pill twice a day for three days.  One pill each morning and two pills each night thereafter. Patient not taking: Reported on 05/24/2020 04/06/20   Moses Manners, MD  doxycycline (VIBRA-TABS) 100 MG tablet Take 1 tablet (100 mg total) by mouth 2 (two) times daily. Patient not taking: No sig reported 04/06/20   Moses Manners, MD    Current Facility-Administered Medications  Medication Dose Route Frequency Provider Last Rate Last Admin  . acetaminophen (TYLENOL) tablet 650 mg  650 mg Oral Q6H PRN Littie Deeds, MD       Or  . acetaminophen (TYLENOL) suppository 650 mg  650 mg Rectal Q6H PRN Littie Deeds, MD      . dextrose 5 %-0.9 % sodium chloride infusion   Intravenous Continuous Littie Deeds, MD 125 mL/hr at 05/24/20 0331 Infusion Verify at 05/24/20 0331  . enoxaparin (LOVENOX) injection 40 mg  40 mg Subcutaneous QHS Littie Deeds, MD   40 mg at 05/24/20 0303  . feeding supplement (ENSURE ENLIVE / ENSURE PLUS) liquid 237 mL  237 mL Oral QID Janit Pagan T, MD      . morphine 2 MG/ML injection 1 mg  1 mg Intravenous Q4H PRN Simmons-Robinson, Makiera, MD   1 mg at 05/24/20 1243  . multivitamin with minerals tablet 1 tablet  1 tablet Oral Daily Eniola, Kehinde T, MD      . nicotine (NICODERM CQ - dosed in mg/24 hours) patch 14 mg  14 mg  Transdermal Daily Littie Deeds, MD   14 mg at 05/24/20 0804  . pantoprazole (PROTONIX) injection 40 mg  40 mg Intravenous Q12H Shirlean Mylar, MD   40 mg at 05/24/20 0805  . [START ON 05/25/2020] pneumococcal 23 valent vaccine (PNEUMOVAX-23) injection 0.5 mL  0.5  mL Intramuscular Tomorrow-1000 Terisa Starr M, MD      . potassium chloride 10 mEq in 100 mL IVPB  10 mEq Intravenous Q1 Hr x 4 Ganta, Anupa, DO 100 mL/hr at 05/24/20 1343 10 mEq at 05/24/20 1343  . [START ON 05/25/2020] remdesivir 100 mg in sodium chloride 0.9 % 100 mL IVPB  100 mg Intravenous Daily Hammons, Kimberly B, RPH        Allergies as of 05/23/2020  . (No Known Allergies)    No family history on file.  Social History   Socioeconomic History  . Marital status: Single    Spouse name: Not on file  . Number of children: Not on file  . Years of education: Not on file  . Highest education level: Not on file  Occupational History  . Not on file  Tobacco Use  . Smoking status: Current Every Day Smoker    Packs/day: 1.50    Years: 20.00    Pack years: 30.00    Types: Cigarettes  . Smokeless tobacco: Never Used  Substance and Sexual Activity  . Alcohol use: Yes    Alcohol/week: 1.0 standard drink    Types: 1 Standard drinks or equivalent per week  . Drug use: Yes    Types: Cocaine  . Sexual activity: Yes    Comment: with wife  Other Topics Concern  . Not on file  Social History Narrative  . Not on file   Social Determinants of Health   Financial Resource Strain: Not on file  Food Insecurity: Not on file  Transportation Needs: Not on file  Physical Activity: Not on file  Stress: Not on file  Social Connections: Not on file  Intimate Partner Violence: Not on file    Review of Systems: All systems reviewed and negative except where noted in HPI.  OBJECTIVE:    Physical Exam: Vital signs in last 24 hours: Temp:  [97.6 F (36.4 C)-98.2 F (36.8 C)] 98 F (36.7 C) (04/19 1211) Pulse Rate:  [53-74]  59 (04/19 1211) Resp:  [16-27] 18 (04/19 1211) BP: (107-136)/(72-110) 136/86 (04/19 1211) SpO2:  [95 %-100 %] 100 % (04/19 1211) Weight:  [54.9 kg] 54.9 kg (04/19 0315) Last BM Date:  (PTA) General:   Alert thin male in NAD Psych:  Pleasant, cooperative. Normal mood and affect. Eyes:  Pupils equal, sclera clear, no icterus.   Conjunctiva pink. Ears:  Normal auditory acuity. Nose:  No deformity, discharge,  or lesions. Neck:  Supple; no masses Lungs:  Clear throughout to auscultation.   No wheezes, crackles, or rhonchi.  Heart:  Regular rate and rhythm; no murmurs, no lower extremity edema Abdomen:  Soft, non-distended, nontender, BS active, no palp mass   Rectal:  Deferred  Msk:  Symmetrical without gross deformities. . Neurologic:  Alert and  oriented x4;  grossly normal neurologically. Skin:  Intact without significant lesions or rashes.  Filed Weights   05/24/20 0315  Weight: 54.9 kg     Scheduled inpatient medications . enoxaparin (LOVENOX) injection  40 mg Subcutaneous QHS  . feeding supplement  237 mL Oral QID  . multivitamin with minerals  1 tablet Oral Daily  . nicotine  14 mg Transdermal Daily  . pantoprazole (PROTONIX) IV  40 mg Intravenous Q12H  . [START ON 05/25/2020] pneumococcal 23 valent vaccine  0.5 mL Intramuscular Tomorrow-1000      Intake/Output from previous day: 04/18 0701 - 04/19 0700 In: 278.9 [I.V.:278.9] Out: 240 [Urine:240] Intake/Output this shift: Total I/O  In: 120 [P.O.:120] Out: -    Lab Results: Recent Labs    05/23/20 2040 05/24/20 0502  WBC 8.3 7.5  HGB 16.8 14.2  HCT 50.8 41.7  PLT 285 210   BMET Recent Labs    05/23/20 2040 05/24/20 0502  NA 133* 132*  K 3.9 3.3*  CL 93* 101  CO2 28 25  GLUCOSE 79 113*  BUN 10 8  CREATININE 0.76 0.67  CALCIUM 9.8 8.6*   LFT Recent Labs    05/23/20 2040  PROT 7.2  ALBUMIN 3.9  AST 20  ALT 12  ALKPHOS 88  BILITOT 1.2   PT/INR No results for input(s): LABPROT, INR in the  last 72 hours. Hepatitis Panel No results for input(s): HEPBSAG, HCVAB, HEPAIGM, HEPBIGM in the last 72 hours.   . CBC Latest Ref Rng & Units 05/24/2020 05/23/2020 07/10/2019  WBC 4.0 - 10.5 K/uL 7.5 8.3 10.9(H)  Hemoglobin 13.0 - 17.0 g/dL 16.1 09.6 04.5  Hematocrit 39.0 - 52.0 % 41.7 50.8 45.8  Platelets 150 - 400 K/uL 210 285 250    . CMP Latest Ref Rng & Units 05/24/2020 05/23/2020 07/10/2019  Glucose 70 - 99 mg/dL 409(W) 79 85  BUN 6 - 20 mg/dL Creatinine 0.61 - 1.24 mg/dL 1.19 1.47 8.29  Sodium 135 - 145 mmol/L 132(L) 133(L) 132(L)  Potassium 3.5 - 5.1 mmol/L 3.3(L) 3.9 5.0  Chloride 98 - 111 mmol/L 101 93(L) 97(L)  CO2 22 - 32 mmol/L Calcium 8.9 - 10.3 mg/dL 5.6(O) 9.8 9.3  Total Protein 6.5 - 8.1 g/dL - 7.2 7.2  Total Bilirubin 0.3 - 1.2 mg/dL - 1.2 1.0  Alkaline Phos 38 - 126 U/L - 88 94  AST 15 - 41 U/L - 20 26  ALT 0 - 44 U/L - 12 13   Studies/Results: CT CHEST W CONTRAST  Result Date: 05/24/2020 CLINICAL DATA:  Chest and flank pain.  Worsening dysphagia. EXAM: CT CHEST, ABDOMEN, AND PELVIS WITH CONTRAST TECHNIQUE: Multidetector CT imaging of the chest, abdomen and pelvis was performed following the standard protocol during bolus administration of intravenous contrast. CONTRAST:  OMNIPAQUE IOHEXOL 300 MG/ML  SOLN COMPARISON:  None. FINDINGS: CT CHEST FINDINGS Cardiovascular: Heart size is normal. Normal aortic branching pattern. No pericardial effusion. Mediastinum/Nodes: Patulous esophagus is debris-filled with an area of focal narrowing at the level of the right pulmonary veins. (Coronal image 81). No mediastinal lymphadenopathy. Normal thyroid. No axillary adenopathy. Lungs/Pleura: Emphysema. No pleural effusion. No nodules or masses. Musculoskeletal: No chest wall mass or suspicious bone lesions identified. CT ABDOMEN PELVIS FINDINGS Hepatobiliary: No focal liver abnormality is seen. No gallstones, gallbladder wall thickening, or biliary dilatation.  Pancreas: Unremarkable. No pancreatic ductal dilatation or surrounding inflammatory changes. Spleen: Normal in size without focal abnormality. Adrenals/Urinary Tract: Adrenal glands are unremarkable. Kidneys are normal, without renal calculi, focal lesion, or hydronephrosis. Bladder is unremarkable. Stomach/Bowel: Stomach is within normal limits. No evidence of bowel wall thickening, distention, or inflammatory changes. Vascular/Lymphatic: Aortic atherosclerosis. No enlarged abdominal or pelvic lymph nodes. Reproductive: Prostate is unremarkable. Other: No abdominal wall hernia or abnormality. No abdominopelvic ascites. Musculoskeletal: No acute or significant osseous findings. IMPRESSION: 1. No acute abnormality of the chest, abdomen or pelvis. 2. Patulous, debris-filled esophagus with focal stenosis at the level of the right pulmonary veins. Aortic Atherosclerosis (ICD10-I70.0) and Emphysema (ICD10-J43.9). Electronically Signed   By: Deatra Robinson M.D.   On: 05/24/2020 01:48   CT ABDOMEN PELVIS  W CONTRAST  Result Date: 05/24/2020 CLINICAL DATA:  Chest and flank pain.  Worsening dysphagia. EXAM: CT CHEST, ABDOMEN, AND PELVIS WITH CONTRAST TECHNIQUE: Multidetector CT imaging of the chest, abdomen and pelvis was performed following the standard protocol during bolus administration of intravenous contrast. CONTRAST:  OMNIPAQUE IOHEXOL 300 MG/ML  SOLN COMPARISON:  None. FINDINGS: CT CHEST FINDINGS Cardiovascular: Heart size is normal. Normal aortic branching pattern. No pericardial effusion. Mediastinum/Nodes: Patulous esophagus is debris-filled with an area of focal narrowing at the level of the right pulmonary veins. (Coronal image 81). No mediastinal lymphadenopathy. Normal thyroid. No axillary adenopathy. Lungs/Pleura: Emphysema. No pleural effusion. No nodules or masses. Musculoskeletal: No chest wall mass or suspicious bone lesions identified. CT ABDOMEN PELVIS FINDINGS Hepatobiliary: No focal liver  abnormality is seen. No gallstones, gallbladder wall thickening, or biliary dilatation. Pancreas: Unremarkable. No pancreatic ductal dilatation or surrounding inflammatory changes. Spleen: Normal in size without focal abnormality. Adrenals/Urinary Tract: Adrenal glands are unremarkable. Kidneys are normal, without renal calculi, focal lesion, or hydronephrosis. Bladder is unremarkable. Stomach/Bowel: Stomach is within normal limits. No evidence of bowel wall thickening, distention, or inflammatory changes. Vascular/Lymphatic: Aortic atherosclerosis. No enlarged abdominal or pelvic lymph nodes. Reproductive: Prostate is unremarkable. Other: No abdominal wall hernia or abnormality. No abdominopelvic ascites. Musculoskeletal: No acute or significant osseous findings. IMPRESSION: 1. No acute abnormality of the chest, abdomen or pelvis. 2. Patulous, debris-filled esophagus with focal stenosis at the level of the right pulmonary veins. Aortic Atherosclerosis (ICD10-I70.0) and Emphysema (ICD10-J43.9). Electronically Signed   By: Deatra Robinson M.D.   On: 05/24/2020 01:48   DG Chest Port 1 View  Result Date: 05/23/2020 CLINICAL DATA:  Shortness of breath, pain. EXAM: PORTABLE CHEST 1 VIEW COMPARISON:  07/10/2019 FINDINGS: Heart and mediastinal contours are within normal limits. No focal opacities or effusions. No acute bony abnormality. IMPRESSION: No active cardiopulmonary disease. Electronically Signed   By: Charlett Nose M.D.   On: 05/23/2020 21:39    Principal Problem:   Dysphagia Active Problems:   Coronary artery disease   Essential hypertension, benign   Hyponatremia   Tobacco use disorder   Dehydration    Willette Cluster, NP-C @  05/24/2020, 2:36 PM

## 2020-05-24 NOTE — Progress Notes (Signed)
Pt admitted from the ED to room 5W25 for dysphagia and transported via stretcher accompanied by ED RN. Pt a&o x4 and able to follow commands; pt was able to ambulate from stretcher to bed w/o difficulties and no noticeable balance issues. Showed pt how to use call bell to call for help and to use the TV and pt understand; informed pt that they are currently NPO to decrease agitation in their abdomen and to prevent any episodes of choking. Pt understood plan of care and has no further questions or concerns. Will continue to monitor.

## 2020-05-25 DIAGNOSIS — F17210 Nicotine dependence, cigarettes, uncomplicated: Secondary | ICD-10-CM | POA: Diagnosis present

## 2020-05-25 DIAGNOSIS — E43 Unspecified severe protein-calorie malnutrition: Secondary | ICD-10-CM | POA: Insufficient documentation

## 2020-05-25 DIAGNOSIS — K2101 Gastro-esophageal reflux disease with esophagitis, with bleeding: Secondary | ICD-10-CM | POA: Diagnosis present

## 2020-05-25 DIAGNOSIS — J1282 Pneumonia due to coronavirus disease 2019: Secondary | ICD-10-CM | POA: Diagnosis present

## 2020-05-25 DIAGNOSIS — R634 Abnormal weight loss: Secondary | ICD-10-CM | POA: Diagnosis not present

## 2020-05-25 DIAGNOSIS — R1115 Cyclical vomiting syndrome unrelated to migraine: Secondary | ICD-10-CM | POA: Diagnosis present

## 2020-05-25 DIAGNOSIS — E871 Hypo-osmolality and hyponatremia: Secondary | ICD-10-CM | POA: Diagnosis present

## 2020-05-25 DIAGNOSIS — I2510 Atherosclerotic heart disease of native coronary artery without angina pectoris: Secondary | ICD-10-CM | POA: Diagnosis present

## 2020-05-25 DIAGNOSIS — I10 Essential (primary) hypertension: Secondary | ICD-10-CM | POA: Diagnosis present

## 2020-05-25 DIAGNOSIS — E860 Dehydration: Secondary | ICD-10-CM | POA: Diagnosis present

## 2020-05-25 DIAGNOSIS — Z23 Encounter for immunization: Secondary | ICD-10-CM | POA: Diagnosis not present

## 2020-05-25 DIAGNOSIS — R1310 Dysphagia, unspecified: Secondary | ICD-10-CM | POA: Diagnosis present

## 2020-05-25 DIAGNOSIS — I69354 Hemiplegia and hemiparesis following cerebral infarction affecting left non-dominant side: Secondary | ICD-10-CM | POA: Diagnosis not present

## 2020-05-25 DIAGNOSIS — Z28310 Unvaccinated for covid-19: Secondary | ICD-10-CM | POA: Diagnosis not present

## 2020-05-25 DIAGNOSIS — U071 COVID-19: Secondary | ICD-10-CM | POA: Diagnosis present

## 2020-05-25 DIAGNOSIS — I252 Old myocardial infarction: Secondary | ICD-10-CM | POA: Diagnosis not present

## 2020-05-25 DIAGNOSIS — E7800 Pure hypercholesterolemia, unspecified: Secondary | ICD-10-CM | POA: Diagnosis present

## 2020-05-25 DIAGNOSIS — K222 Esophageal obstruction: Secondary | ICD-10-CM | POA: Diagnosis present

## 2020-05-25 DIAGNOSIS — R64 Cachexia: Secondary | ICD-10-CM | POA: Diagnosis present

## 2020-05-25 DIAGNOSIS — R1314 Dysphagia, pharyngoesophageal phase: Secondary | ICD-10-CM | POA: Diagnosis present

## 2020-05-25 DIAGNOSIS — K2211 Ulcer of esophagus with bleeding: Secondary | ICD-10-CM | POA: Diagnosis present

## 2020-05-25 DIAGNOSIS — Z681 Body mass index (BMI) 19 or less, adult: Secondary | ICD-10-CM | POA: Diagnosis not present

## 2020-05-25 DIAGNOSIS — E876 Hypokalemia: Secondary | ICD-10-CM | POA: Diagnosis present

## 2020-05-25 DIAGNOSIS — R059 Cough, unspecified: Secondary | ICD-10-CM

## 2020-05-25 DIAGNOSIS — T18128A Food in esophagus causing other injury, initial encounter: Secondary | ICD-10-CM | POA: Diagnosis present

## 2020-05-25 DIAGNOSIS — E878 Other disorders of electrolyte and fluid balance, not elsewhere classified: Secondary | ICD-10-CM | POA: Diagnosis present

## 2020-05-25 LAB — URINE CULTURE: Culture: NO GROWTH

## 2020-05-25 LAB — BASIC METABOLIC PANEL
Anion gap: 8 (ref 5–15)
BUN: <5 mg/dL — ABNORMAL LOW (ref 6–20)
CO2: 22 mmol/L (ref 22–32)
Calcium: 8.6 mg/dL — ABNORMAL LOW (ref 8.9–10.3)
Chloride: 105 mmol/L (ref 98–111)
Creatinine, Ser: 0.63 mg/dL (ref 0.61–1.24)
GFR, Estimated: >60 mL/min (ref >60)
Glucose, Bld: 108 mg/dL — ABNORMAL HIGH (ref 70–99)
Potassium: 3.6 mmol/L (ref 3.5–5.1)
Sodium: 135 mmol/L (ref 135–145)

## 2020-05-25 LAB — CBC
HCT: 44.7 % (ref 39.0–52.0)
Hemoglobin: 14.8 g/dL (ref 13.0–17.0)
MCH: 32.6 pg (ref 26.0–34.0)
MCHC: 33.1 g/dL (ref 30.0–36.0)
MCV: 98.5 fL (ref 80.0–100.0)
Platelets: 194 10*3/uL (ref 150–400)
RBC: 4.54 MIL/uL (ref 4.22–5.81)
RDW: 14.7 % (ref 11.5–15.5)
WBC: 9.3 10*3/uL (ref 4.0–10.5)
nRBC: 0 % (ref 0.0–0.2)

## 2020-05-25 NOTE — Progress Notes (Addendum)
  Patient denies any new concerns.  Exam: Gen: He is in no distress, cachectic with loss of muscle mass Neuro: Grossly intact Heart: S1 S2 normal, no murmur. RRR Lungs: reduced air entry but clear and equal B/L Abd: Soft, NT/ND MSK: Prominent kyphosis of his thoracic spine  A/P: Dysphagia due to esophageal stenosis GI consulted Awaiting Barium swallow and likely EGD  QTc prolongation: Improved Avoid QT prolonging agents.   Severe protein calory malnutrition: due to poor oral intake BMI of 17 with loss of muscle mass Dietitian consulted

## 2020-05-25 NOTE — Progress Notes (Signed)
Family Medicine Teaching Service Daily Progress Note Intern Pager: [phone]  Patient name: Ralph [name] Medical record number: [id number] Date of birth: Aug 03, 1965 Age: 55 y.o. Gender: male  Primary Care Provider: Moses Manners, MD Consultants: GI Code Status: Full  Pt Overview and Major Events to Date:  4/18: Admitted  Assessment and Plan: Ralph [name] a 55 y.o.malepresenting with dysphagia and weightloss. PMH is significant forgastritis and esophageal stricture, CVA, tobacco use, HTN, HLD.  Dysphagia  Weight loss Concern for GI malignancy Patient has extensive GI history. EGD with [place] in August 2021 that demonstrated submucosal thickening of distal esophagus and reactive LNs. Biopsy of distal esophagus on 04/29/19 c/w squamocolumnar junction with focal intestinal metaplasia, negative for malignancy.CT abdomen/pelvis demonstrated no acute abnormality of the chest, abdomen or pelvis. Speech evaluation completed, recommended dysphagia 1 diet. Also noted to have patulous, debris filled esophagus with focal stenosis at the level of the right pulmonary veins. Patient previously followed up with [facility] but after calling the office today, determined that patient has been dismissed from the practice in 2021.  -GI following, appreciate continued involvement and recs. Patient to get barium swallow study on 4/21.  - mIVF D5NS 125 ml/hr - vitals per unit routine -IV protonix 40 mg bid  - Up with assistance  COVID-19 infection Denies dyspnea. Incidental finding on admission. Patient endorsing rhinorrhea. CXR demonstrated no active cardiopulmonary disease.  - remdesivir (day 2/3) - airborne/droplet precautions - continuous pulse ox x 24 hr  Chest pain Likely secondary to extensive GI and esophageal history.  - IV morphine 1 mg q2h prn - consider CTAchestif clinicallyworsening  Hematuria Noted prior to admission on two separate occasions. UA negative for Hgb.  - pending  urine culture  Cachexia  Protein calorie malnutrition Weight loss in the setting of poor oral intake.  - nutrition consulted, appreciate recs   Hyponatremia Resolved. - monitor BMP  Hypokalemia Resolved, K 3.6.  - am BMP  QTc prolongation Prior reading 420. - avoid QTc prolonging medications - monitor EKG  History of HTN Normotensive this morning, BP 134/89. Not on any home antihypertensives. - monitor BP  HLD Onatorvastatin, though appears patient is not taking this. - encourage statin therapy outpatient   CVA Patient has history of stroke in 2018 with residual left-sided weakness. Home medications include atorvastatinand ASA, though appears patient is not taking these. -encouraged that patient be on aspirin given CVA history, recommend discussing with PCP outpatient to start on aspirin and statin   Right inguinal hernia Patient has known hernia, diagnosed with varicocele at [facility] recently. He saw Dr. Roe Rutherford and was treated with doxycycline.Patient states that hernia has resolved and he does not have any pain in the area.  Tobacco use disorder Patient reports smoking 1PPD since age of 43, recently cut back to 1/2PPD.  - nicotine patch - consider restarting bupropion   Alcohol use He reports that he drinks 1-2 40 oz beers per night. Last drink 4/18.Denies history of alcohol withdrawal. Recent CIWA 0. - thiamine   MDD Prescribed bupropion but is not taking at home. - consider restarting bupropion outpatient   FEN/GI: dysphagia 1 diet PPx: lovenox   Status is: Inpatient  Remains inpatient appropriate because:Ongoing diagnostic testing needed not appropriate for outpatient work up   Dispo: The patient is from: Home              Anticipated d/c is to: Home  Patient currently is not medically stable to d/c.   Difficult to place patient No        Subjective:  No acute overnight event reported. Patient able to have  some intake but still "spitting up." Endorsing generalized abdominal pain. Updated patient on plan thus far, he is agreeable. He has no further questions or concerns at this time.   Objective: Temp:  [98.2 F (36.8 C)-98.5 F (36.9 C)] 98.5 F (36.9 C) (04/20 1132) Pulse Rate:  [52-63] 61 (04/20 1132) Resp:  [16-19] 16 (04/20 1132) BP: (134-158)/(89-127) 149/127 (04/20 1132) SpO2:  [95 %-98 %] 98 % (04/20 1132) Physical Exam: General: Patient sitting upright in bed, in no acute distress. Cardiovascular: RRR, no murmurs or gallops auscultated  Respiratory: CTAB, no wheezing, rales or rhonchi noted, breathing comfortably on room air Abdomen: soft, generalized abdominal tenderness upon palpation, nondistended, BS+ Extremities: no LE edema noted bilaterally, radial and distal pulses strong and equal bilaterally Psych: mood appropriate   Laboratory: Recent Labs  Lab 05/23/20 2040 05/24/20 0502 05/25/20 0213  WBC 8.3 7.5 9.3  HGB 16.8 14.2 14.8  HCT 50.8 41.7 44.7  PLT 285 210 194   Recent Labs  Lab 05/23/20 2040 05/24/20 0502 05/25/20 0213  NA 133* 132* 135  K 3.9 3.3* 3.6  CL 93* 101 105  CO2 28 25 22   BUN 10 8 <5*  CREATININE 0.76 0.67 0.63  CALCIUM 9.8 8.6* 8.6*  PROT 7.2  --   --   BILITOT 1.2  --   --   ALKPHOS 88  --   --   ALT 12  --   --   AST 20  --   --   GLUCOSE 79 113* 108*      Imaging/Diagnostic Tests: No results found.  , DO 05/25/2020, 2:29 PM PGY-1, [facility] Family Medicine FPTS Intern pager: [phone], text pages welcome

## 2020-05-25 NOTE — Progress Notes (Addendum)
Progress Note  Chief Complaint:   Swallowing problems      ASSESSMENT / PLAN:    # COVID positive  # Chronic GERD with severe  erosive esophagitis, chronic dysphagia with chronic esophageal stricture. Admitted with recurrent dysphagia. Last dilation in Aug 2021 by [facility] ( to 18 mm). Prior esophageal biopsies have been negative for EoE, dysplasia or malignancy. He had an intramural nodule in distal esophagus but biopsies negative.  --Mild improvement ( less discomfort) with Magic mouthwash / lidocaine and carafate. Tolerated < 50 percent of lunch. Frequently expectorating combination of saliva and food particles.   --Continue magic mouthwash w/ lidocaine. Continue Carafate ac and HS.  --Continue anti-reflux measures ( we discussed) --Plan is for esophagram (without tablet) tomorrow to assess for esophageal dysmotility / esophageal stenosis and then most likely EGD on Friday.      SUBJECTIVE:   Possibly some improvement with magic mouthwash, lidocaine and carafate. They help some of the discomfort but still regurgitating and expectorating esophageal secretions with occasional food particles.    OBJECTIVE:    Scheduled inpatient medications:  . enoxaparin (LOVENOX) injection  40 mg Subcutaneous QHS  . feeding supplement  237 mL Oral QID  . magic mouthwash w/lidocaine  5 mL Oral QID  . multivitamin with minerals  1 tablet Oral Daily  . nicotine  14 mg Transdermal Daily  . pantoprazole (PROTONIX) IV  40 mg Intravenous Q12H  . pneumococcal 23 valent vaccine  0.5 mL Intramuscular Tomorrow-1000  . sucralfate  1 g Oral TID WC & HS   Continuous inpatient infusions:  . dextrose 5 % and 0.9% NaCl 125 mL/hr at 05/25/20 0939  . remdesivir 100 mg in NS 100 mL 100 mg (05/25/20 0833)   PRN inpatient medications: acetaminophen **OR** acetaminophen, melatonin, morphine injection  Vital signs in last 24 hours: Temp:  [98.2 F (36.8 C)-98.5 F (36.9 C)] 98.5 F (36.9 C) (04/20  1132) Pulse Rate:  [52-63] 61 (04/20 1132) Resp:  [16-19] 16 (04/20 1132) BP: (134-158)/(89-127) 149/127 (04/20 1132) SpO2:  [95 %-98 %] 98 % (04/20 1132) Last BM Date:  (PTA)  Intake/Output Summary (Last 24 hours) at 05/25/2020 1255 Last data filed at 05/25/2020 1135 Gross per 24 hour  Intake 2441.11 ml  Output 1960 ml  Net 481.11 ml     Physical Exam:  . General: Alert male in NAD . Heart:  Regular rate and rhythm. No lower extremity edema . Pulmonary: Normal respiratory effort . Abdomen: Soft, nondistended, nontender. Normal bowel sounds.  . Neurologic: Alert and oriented . Psych: Pleasant. Cooperative.   Filed Weights   05/24/20 0315  Weight: 54.9 kg    Intake/Output from previous day: 04/19 0701 - 04/20 0700 In: 2201.1 [P.O.:120; I.V.:1718.8; IV Piggyback:362.3] Out: 1210 [Urine:1210] Intake/Output this shift: Total I/O In: 360 [P.O.:360] Out: 750 [Urine:200; Emesis/NG output:550]    Lab Results: Recent Labs    05/23/20 2040 05/24/20 0502 05/25/20 0213  WBC 8.3 7.5 9.3  HGB 16.8 14.2 14.8  HCT 50.8 41.7 44.7  PLT 285 210 194   BMET Recent Labs    05/23/20 2040 05/24/20 0502 05/25/20 0213  NA 133* 132* 135  K 3.9 3.3* 3.6  CL 93* 101 105  CO2 28 25 22   GLUCOSE 79 113* 108*  BUN 10 8 <5*  CREATININE 0.76 0.67 0.63  CALCIUM 9.8 8.6* 8.6*   LFT Recent Labs    05/23/20 2040  PROT 7.2  ALBUMIN 3.9  AST 20  ALT 12  ALKPHOS 88  BILITOT 1.2   PT/INR No results for input(s): LABPROT, INR in the last 72 hours. Hepatitis Panel No results for input(s): HEPBSAG, HCVAB, HEPAIGM, HEPBIGM in the last 72 hours.  CT CHEST W CONTRAST  Result Date: 05/24/2020 CLINICAL DATA:  Chest and flank pain.  Worsening dysphagia. EXAM: CT CHEST, ABDOMEN, AND PELVIS WITH CONTRAST TECHNIQUE: Multidetector CT imaging of the chest, abdomen and pelvis was performed following the standard protocol during bolus administration of intravenous contrast. CONTRAST:   OMNIPAQUE IOHEXOL 300 MG/ML  SOLN COMPARISON:  None. FINDINGS: CT CHEST FINDINGS Cardiovascular: Heart size is normal. Normal aortic branching pattern. No pericardial effusion. Mediastinum/Nodes: Patulous esophagus is debris-filled with an area of focal narrowing at the level of the right pulmonary veins. (Coronal image 81). No mediastinal lymphadenopathy. Normal thyroid. No axillary adenopathy. Lungs/Pleura: Emphysema. No pleural effusion. No nodules or masses. Musculoskeletal: No chest wall mass or suspicious bone lesions identified. CT ABDOMEN PELVIS FINDINGS Hepatobiliary: No focal liver abnormality is seen. No gallstones, gallbladder wall thickening, or biliary dilatation. Pancreas: Unremarkable. No pancreatic ductal dilatation or surrounding inflammatory changes. Spleen: Normal in size without focal abnormality. Adrenals/Urinary Tract: Adrenal glands are unremarkable. Kidneys are normal, without renal calculi, focal lesion, or hydronephrosis. Bladder is unremarkable. Stomach/Bowel: Stomach is within normal limits. No evidence of bowel wall thickening, distention, or inflammatory changes. Vascular/Lymphatic: Aortic atherosclerosis. No enlarged abdominal or pelvic lymph nodes. Reproductive: Prostate is unremarkable. Other: No abdominal wall hernia or abnormality. No abdominopelvic ascites. Musculoskeletal: No acute or significant osseous findings. IMPRESSION: 1. No acute abnormality of the chest, abdomen or pelvis. 2. Patulous, debris-filled esophagus with focal stenosis at the level of the right pulmonary veins. Aortic Atherosclerosis (ICD10-I70.0) and Emphysema (ICD10-J43.9). Electronically Signed   By: Deatra Robinson M.D.   On: 05/24/2020 01:48   CT ABDOMEN PELVIS W CONTRAST  Result Date: 05/24/2020 CLINICAL DATA:  Chest and flank pain.  Worsening dysphagia. EXAM: CT CHEST, ABDOMEN, AND PELVIS WITH CONTRAST TECHNIQUE: Multidetector CT imaging of the chest, abdomen and pelvis was performed following the  standard protocol during bolus administration of intravenous contrast. CONTRAST:  OMNIPAQUE IOHEXOL 300 MG/ML  SOLN COMPARISON:  None. FINDINGS: CT CHEST FINDINGS Cardiovascular: Heart size is normal. Normal aortic branching pattern. No pericardial effusion. Mediastinum/Nodes: Patulous esophagus is debris-filled with an area of focal narrowing at the level of the right pulmonary veins. (Coronal image 81). No mediastinal lymphadenopathy. Normal thyroid. No axillary adenopathy. Lungs/Pleura: Emphysema. No pleural effusion. No nodules or masses. Musculoskeletal: No chest wall mass or suspicious bone lesions identified. CT ABDOMEN PELVIS FINDINGS Hepatobiliary: No focal liver abnormality is seen. No gallstones, gallbladder wall thickening, or biliary dilatation. Pancreas: Unremarkable. No pancreatic ductal dilatation or surrounding inflammatory changes. Spleen: Normal in size without focal abnormality. Adrenals/Urinary Tract: Adrenal glands are unremarkable. Kidneys are normal, without renal calculi, focal lesion, or hydronephrosis. Bladder is unremarkable. Stomach/Bowel: Stomach is within normal limits. No evidence of bowel wall thickening, distention, or inflammatory changes. Vascular/Lymphatic: Aortic atherosclerosis. No enlarged abdominal or pelvic lymph nodes. Reproductive: Prostate is unremarkable. Other: No abdominal wall hernia or abnormality. No abdominopelvic ascites. Musculoskeletal: No acute or significant osseous findings. IMPRESSION: 1. No acute abnormality of the chest, abdomen or pelvis. 2. Patulous, debris-filled esophagus with focal stenosis at the level of the right pulmonary veins. Aortic Atherosclerosis (ICD10-I70.0) and Emphysema (ICD10-J43.9). Electronically Signed   By: Deatra Robinson M.D.   On: 05/24/2020 01:48   DG Chest Port 1 View  Result Date: 05/23/2020  CLINICAL DATA:  Shortness of breath, pain. EXAM: PORTABLE CHEST 1 VIEW COMPARISON:  07/10/2019 FINDINGS: Heart and mediastinal  contours are within normal limits. No focal opacities or effusions. No acute bony abnormality. IMPRESSION: No active cardiopulmonary disease. Electronically Signed   By: Charlett Nose M.D.   On: 05/23/2020 21:39     Principal Problem:   Dysphagia Active Problems:   Coronary artery disease   Essential hypertension, benign   Hyponatremia   Tobacco use disorder   Dehydration   Protein-calorie malnutrition, severe   Cough     LOS: 0 days   Willette Cluster ,NP 05/25/2020, 12:55 PM     Attending physician's note   I have taken an interval history, reviewed the chart and examined the patient. I agree with the Advanced Practitioner's note, impression and recommendations.   We will plan to proceed with barium esophagram tomorrow to better delineate esophageal stricture versus extrinsic compression versus dysmotility and we will then proceed with EGD on Friday His symptoms could be secondary to erosive esophagitis secondary to stasis with poor esophageal motility but will need to exclude EGD outflow obstruction/neoplastic lesion.   Iona Beard , MD [phone]

## 2020-05-26 ENCOUNTER — Inpatient Hospital Stay (HOSPITAL_COMMUNITY): Payer: Medicaid Other

## 2020-05-26 DIAGNOSIS — I10 Essential (primary) hypertension: Secondary | ICD-10-CM

## 2020-05-26 DIAGNOSIS — R1319 Other dysphagia: Secondary | ICD-10-CM

## 2020-05-26 LAB — COMPREHENSIVE METABOLIC PANEL
ALT: 10 U/L (ref 0–44)
AST: 15 U/L (ref 15–41)
Albumin: 3 g/dL — ABNORMAL LOW (ref 3.5–5.0)
Alkaline Phosphatase: 61 U/L (ref 38–126)
Anion gap: 7 (ref 5–15)
BUN: <5 mg/dL — ABNORMAL LOW (ref 6–20)
CO2: 25 mmol/L (ref 22–32)
Calcium: 8.8 mg/dL — ABNORMAL LOW (ref 8.9–10.3)
Chloride: 103 mmol/L (ref 98–111)
Creatinine, Ser: 0.6 mg/dL — ABNORMAL LOW (ref 0.61–1.24)
GFR, Estimated: >60 mL/min (ref >60)
Glucose, Bld: 106 mg/dL — ABNORMAL HIGH (ref 70–99)
Potassium: 3.7 mmol/L (ref 3.5–5.1)
Sodium: 135 mmol/L (ref 135–145)
Total Bilirubin: 0.8 mg/dL (ref 0.3–1.2)
Total Protein: 5.7 g/dL — ABNORMAL LOW (ref 6.5–8.1)

## 2020-05-26 LAB — CBC
HCT: 42.1 % (ref 39.0–52.0)
Hemoglobin: 13.9 g/dL (ref 13.0–17.0)
MCH: 32.6 pg (ref 26.0–34.0)
MCHC: 33 g/dL (ref 30.0–36.0)
MCV: 98.8 fL (ref 80.0–100.0)
Platelets: 229 10*3/uL (ref 150–400)
RBC: 4.26 MIL/uL (ref 4.22–5.81)
RDW: 14.9 % (ref 11.5–15.5)
WBC: 9.7 10*3/uL (ref 4.0–10.5)
nRBC: 0 % (ref 0.0–0.2)

## 2020-05-26 LAB — BASIC METABOLIC PANEL
Anion gap: 7 (ref 5–15)
BUN: <5 mg/dL — ABNORMAL LOW (ref 6–20)
CO2: 27 mmol/L (ref 22–32)
Calcium: 8.7 mg/dL — ABNORMAL LOW (ref 8.9–10.3)
Chloride: 103 mmol/L (ref 98–111)
Creatinine, Ser: 0.66 mg/dL (ref 0.61–1.24)
GFR, Estimated: >60 mL/min (ref >60)
Glucose, Bld: 99 mg/dL (ref 70–99)
Potassium: 2.8 mmol/L — ABNORMAL LOW (ref 3.5–5.1)
Sodium: 137 mmol/L (ref 135–145)

## 2020-05-26 MED ORDER — POTASSIUM CHLORIDE 10 MEQ/100ML IV SOLN
10.0000 meq | INTRAVENOUS | Status: AC
  Administered 2020-05-26 (×3): 10 meq via INTRAVENOUS
  Filled 2020-05-26 (×4): qty 100

## 2020-05-26 MED ORDER — POTASSIUM CHLORIDE 10 MEQ/100ML IV SOLN
10.0000 meq | Freq: Once | INTRAVENOUS | Status: AC
  Administered 2020-05-26: 10 meq via INTRAVENOUS

## 2020-05-26 NOTE — Progress Notes (Signed)
Family Medicine Teaching Service Daily Progress Note Intern Pager: [phone]  Patient name: Ralph [name] Medical record number: [id number] Date of birth: [birth year] Age: 55 y.o. Gender: male  Primary Care Provider: Moses Manners, MD Consultants: GI Code Status: Full  Pt Overview and Major Events to Date:  4/18: Admitted   Assessment and Plan: DAVIED NOCITO a 54 y.o.malepresenting with dysphagia and weightloss. PMH is significant forgastritis and esophageal stricture, CVA, tobacco use, HTN, HLD.  Dysphagia  Weight loss History of esophageal stricture Patient has extensive GI history.EGD with [place] in August 2021 that demonstrated submucosal thickening of distal esophagus and reactive LNs. Biopsy of distal esophagus on 04/29/19 c/w squamocolumnar junction with focal intestinal metaplasia, negative for malignancy.CT abdomen/pelvis demonstrated no acute abnormality of the chest, abdomen or pelvis. Speech evaluation completed, recommended dysphagia 1 diet. Also noted to have patulous, debris filled esophagus with focal stenosis at the level of the right pulmonary veins. Patient previously followed up with [facility] but after calling the office today, determined that patient has been dismissed from the practice in 2021.  -GI following, appreciate continued involvement and recs. Patient to get barium esophogram today and EGD 4/22 .  -NPO since midnight pending barium esophogram  - mIVF D5NS 125 ml/hr - vitals per unit routine -IV protonix 40 mg bid - Up with assistance  COVID-19 infection Denies dyspnea. Incidental finding on admission. Patient endorsing rhinorrhea. CXR demonstrated no active cardiopulmonary disease.  - remdesivir (day 3/3) - airborne/droplet precautions - continuous pulse ox x 24 hr  Hypokalemia K 2.8, possibly secondary to poor oral intake. -IV KCl 40 mEq -recheck pm BMP  Chest pain Likely secondary to extensive GI and esophageal history. - IV morphine  1 mg q2h prn - consider CTAchestif clinicallyworsening  Hematuria Noted prior to admission on two separate occasions. UA negative for Hgb. -Urine culture notable for no growth thus far.   Cachexia  Protein calorie malnutrition Weight loss in the setting of poor oral intake. -nutrition consulted, appreciate recs   QTc prolongation Prior reading 420. - avoid QTc prolonging medications - monitor EKG  History ofHTN Normotensive this morning, BP 130/87.Not on any home antihypertensives. - monitor BP  HLD Onatorvastatin, though appears patient is not taking this. - encourage statin therapy outpatient   CVA Patient has history of stroke in 2018 with residual left-sided weakness. Home medications include atorvastatinand ASA, though appears patient is not taking these. -encouraged that patient be on aspirin given CVA history, recommend discussing with PCP outpatient to start on aspirin and statin   Right inguinal hernia Patient has known hernia, diagnosed with varicocele at [facility] recently. He saw Dr. Roe Rutherford and was treated with doxycycline.Patient states that hernia has resolved and he does not have any pain in the area.  Tobacco use disorder Patient reports smoking 1PPD since age of 16, recently cut back to 1/2PPD.  - nicotine patch - consider restarting bupropion   Alcoholuse He reports that he drinks 1-2 40 oz beers per night. Last drink 4/18.Denies history of alcohol withdrawal.Recent CIWA 0. - discontinued CIWAs - thiamine  MDD Prescribed bupropion but is not taking at home. -consider restarting bupropion outpatient  FEN/GI: dysphagia 1 diet  PPx: lovenox    Status is: Inpatient  Remains inpatient appropriate because:Ongoing diagnostic testing needed not appropriate for outpatient work up   Dispo: The patient is from: Home              Anticipated d/c is to: Home  Patient currently is not medically stable to  d/c.   Difficult to place patient No        Subjective:  No significant overnight events reported. Patient denies any major concerns this morning.   Objective: Temp:  [98.1 F (36.7 C)-98.5 F (36.9 C)] 98.2 F (36.8 C) (04/21 0400) Pulse Rate:  [57-61] 57 (04/21 0400) Resp:  [16-18] 18 (04/21 0400) BP: (130-149)/(87-127) 130/87 (04/21 0400) SpO2:  [96 %-99 %] 96 % (04/21 0400) Physical Exam: General: Patient sitting upright in bed, in no acute distress. Cardiovascular: RRR, no murmurs or gallops  Respiratory: CTAB, breathing comfortably on room air, no wheezing, rales or rhonchi Abdomen: soft, generalized abdominal tenderness on deep palpation, BS+ Extremities: radial and distal pulses strong and equal bilaterally, no LE edema noted bilaterally  Psych: mood appropriate   Laboratory: Recent Labs  Lab 05/23/20 2040 05/24/20 0502 05/25/20 0213  WBC 8.3 7.5 9.3  HGB 16.8 14.2 14.8  HCT 50.8 41.7 44.7  PLT 285 210 194   Recent Labs  Lab 05/23/20 2040 05/24/20 0502 05/25/20 0213  NA 133* 132* 135  K 3.9 3.3* 3.6  CL 93* 101 105  CO2 28 25 22   BUN 10 8 <5*  CREATININE 0.76 0.67 0.63  CALCIUM 9.8 8.6* 8.6*  PROT 7.2  --   --   BILITOT 1.2  --   --   ALKPHOS 88  --   --   ALT 12  --   --   AST 20  --   --   GLUCOSE 79 113* 108*      Imaging/Diagnostic Tests: No results found.  , DO 05/26/2020, 6:51 AM PGY-1, [facility] Family Medicine FPTS Intern pager: [phone], text pages welcome

## 2020-05-27 ENCOUNTER — Inpatient Hospital Stay (HOSPITAL_COMMUNITY): Payer: Medicaid Other | Admitting: Certified Registered Nurse Anesthetist

## 2020-05-27 ENCOUNTER — Encounter (HOSPITAL_COMMUNITY)
Admission: EM | Disposition: A | Discharge status: Home / Self Care | Payer: Self-pay | Source: Home / Self Care | Attending: Family Medicine

## 2020-05-27 DIAGNOSIS — F17200 Nicotine dependence, unspecified, uncomplicated: Secondary | ICD-10-CM

## 2020-05-27 DIAGNOSIS — I2583 Coronary atherosclerosis due to lipid rich plaque: Secondary | ICD-10-CM

## 2020-05-27 DIAGNOSIS — I2510 Atherosclerotic heart disease of native coronary artery without angina pectoris: Secondary | ICD-10-CM

## 2020-05-27 HISTORY — PX: ESOPHAGOGASTRODUODENOSCOPY (EGD) WITH PROPOFOL: SHX5813

## 2020-05-27 HISTORY — PX: FOREIGN BODY REMOVAL: SHX962

## 2020-05-27 HISTORY — PX: BIOPSY: SHX5522

## 2020-05-27 LAB — CBC
HCT: 43.5 % (ref 39.0–52.0)
Hemoglobin: 14.7 g/dL (ref 13.0–17.0)
MCH: 32.7 pg (ref 26.0–34.0)
MCHC: 33.8 g/dL (ref 30.0–36.0)
MCV: 96.7 fL (ref 80.0–100.0)
Platelets: 193 10*3/uL (ref 150–400)
RBC: 4.5 MIL/uL (ref 4.22–5.81)
RDW: 14.4 % (ref 11.5–15.5)
WBC: 6.4 10*3/uL (ref 4.0–10.5)
nRBC: 0 % (ref 0.0–0.2)

## 2020-05-27 LAB — BASIC METABOLIC PANEL
Anion gap: 5 (ref 5–15)
BUN: <5 mg/dL — ABNORMAL LOW (ref 6–20)
CO2: 24 mmol/L (ref 22–32)
Calcium: 8.7 mg/dL — ABNORMAL LOW (ref 8.9–10.3)
Chloride: 107 mmol/L (ref 98–111)
Creatinine, Ser: 0.57 mg/dL — ABNORMAL LOW (ref 0.61–1.24)
GFR, Estimated: >60 mL/min (ref >60)
Glucose, Bld: 119 mg/dL — ABNORMAL HIGH (ref 70–99)
Potassium: 3.5 mmol/L (ref 3.5–5.1)
Sodium: 136 mmol/L (ref 135–145)

## 2020-05-27 SURGERY — ESOPHAGOGASTRODUODENOSCOPY (EGD) WITH PROPOFOL
Anesthesia: Monitor Anesthesia Care

## 2020-05-27 MED ORDER — ACETAMINOPHEN 10 MG/ML IV SOLN: 1000.0000 mg | Freq: Once | INTRAVENOUS | Status: DC | PRN

## 2020-05-27 MED ORDER — FENTANYL CITRATE (PF) 250 MCG/5ML IJ SOLN
INTRAMUSCULAR | Status: DC | PRN
  Administered 2020-05-27: 100 ug via INTRAVENOUS

## 2020-05-27 MED ORDER — ONDANSETRON HCL 4 MG/2ML IJ SOLN
INTRAMUSCULAR | Status: DC | PRN
  Administered 2020-05-27: 4 mg via INTRAVENOUS

## 2020-05-27 MED ORDER — MIDAZOLAM HCL 2 MG/2ML IJ SOLN
INTRAMUSCULAR | Status: DC | PRN
  Administered 2020-05-27 (×2): 2.5 mg via INTRAVENOUS

## 2020-05-27 MED ORDER — FENTANYL CITRATE (PF) 100 MCG/2ML IJ SOLN
INTRAMUSCULAR | Status: AC
  Filled 2020-05-27: qty 2

## 2020-05-27 MED ORDER — FENTANYL CITRATE (PF) 100 MCG/2ML IJ SOLN: 25.0000 ug | INTRAMUSCULAR | Status: DC | PRN

## 2020-05-27 MED ORDER — LACTATED RINGERS IV SOLN: INTRAVENOUS | Status: DC | PRN

## 2020-05-27 MED ORDER — LIDOCAINE 2% (20 MG/ML) 5 ML SYRINGE
INTRAMUSCULAR | Status: DC | PRN
  Administered 2020-05-27: 60 mg via INTRAVENOUS

## 2020-05-27 MED ORDER — MIDAZOLAM HCL (PF) 5 MG/ML IJ SOLN
INTRAMUSCULAR | Status: AC
  Filled 2020-05-27: qty 1

## 2020-05-27 MED ORDER — PROMETHAZINE HCL 25 MG/ML IJ SOLN: 6.2500 mg | INTRAMUSCULAR | Status: DC | PRN

## 2020-05-27 MED ORDER — GLYCOPYRROLATE 0.2 MG/ML IJ SOLN
INTRAMUSCULAR | Status: DC | PRN
  Administered 2020-05-27: .2 mg via INTRAVENOUS

## 2020-05-27 MED ORDER — PHENYLEPHRINE HCL-NACL 10-0.9 MG/250ML-% IV SOLN
INTRAVENOUS | Status: DC | PRN
  Administered 2020-05-27: 25 ug/min via INTRAVENOUS

## 2020-05-27 MED ORDER — EPHEDRINE SULFATE-NACL 50-0.9 MG/10ML-% IV SOSY
PREFILLED_SYRINGE | INTRAVENOUS | Status: DC | PRN
  Administered 2020-05-27 (×2): 10 mg via INTRAVENOUS

## 2020-05-27 MED ORDER — SUCCINYLCHOLINE CHLORIDE 200 MG/10ML IV SOSY
PREFILLED_SYRINGE | INTRAVENOUS | Status: DC | PRN
  Administered 2020-05-27: 100 mg via INTRAVENOUS

## 2020-05-27 MED ORDER — PROPOFOL 10 MG/ML IV BOLUS
INTRAVENOUS | Status: DC | PRN
  Administered 2020-05-27: 100 mg via INTRAVENOUS

## 2020-05-27 SURGICAL SUPPLY — 15 items

## 2020-05-27 NOTE — Anesthesia Procedure Notes (Signed)
Procedure Name: Intubation Date/Time: 05/27/2020 3:58 PM Performed by: Kathryne Hitch, CRNA Pre-anesthesia Checklist: Patient identified, Emergency Drugs available, Suction available and Patient being monitored Patient Re-evaluated:Patient Re-evaluated prior to induction Oxygen Delivery Method: Circle system utilized Preoxygenation: Pre-oxygenation with 100% oxygen Induction Type: IV induction Ventilation: Mask ventilation without difficulty Laryngoscope Size: Mac and 4 Grade View: Grade I Tube type: Oral Tube size: 7.5 mm Number of attempts: 1 Airway Equipment and Method: Stylet and Oral airway Placement Confirmation: ETT inserted through vocal cords under direct vision,  positive ETCO2 and breath sounds checked- equal and bilateral Secured at: 23 cm Tube secured with: Tape Dental Injury: Teeth and Oropharynx as per pre-operative assessment

## 2020-05-27 NOTE — Transfer of Care (Signed)
Immediate Anesthesia Transfer of Care Note  Patient: Ralph [name]  Procedure(s) Performed: ESOPHAGOGASTRODUODENOSCOPY (EGD) WITH PROPOFOL (N/A ) BIOPSY  Patient Location: Endoscopy Unit  Anesthesia Type:General  Level of Consciousness: drowsy and patient cooperative  Airway & Oxygen Therapy: Patient Spontanous Breathing and Patient connected to face mask oxygen  Post-op Assessment: Report given to RN and Post -op Vital signs reviewed and stable  Post vital signs: Reviewed and stable  Last Vitals:  Vitals Value Taken Time  BP 142/74 05/27/20 1620  Temp    Pulse 55 05/27/20 1620  Resp 29 05/27/20 1620  SpO2 98 % 05/27/20 1620    Last Pain:  Vitals:   05/27/20 1620  TempSrc:   PainSc: Asleep      Patients Stated Pain Goal: 4 (05/27/20 0800)  Complications: No complications documented.

## 2020-05-27 NOTE — Anesthesia Postprocedure Evaluation (Signed)
Anesthesia Post Note  Patient: Ralph [name]  Procedure(s) Performed: ESOPHAGOGASTRODUODENOSCOPY (EGD) WITH PROPOFOL (N/A ) BIOPSY     Patient location during evaluation: Endoscopy Anesthesia Type: General Level of consciousness: awake and alert Pain management: pain level controlled Vital Signs Assessment: post-procedure vital signs reviewed and stable Respiratory status: spontaneous breathing, nonlabored ventilation, respiratory function stable and patient connected to nasal cannula oxygen Cardiovascular status: blood pressure returned to baseline and stable Postop Assessment: no apparent nausea or vomiting Anesthetic complications: no   No complications documented.  Last Vitals:  Vitals:   05/27/20 1636 05/27/20 1705  BP: 108/79 118/79  Pulse: (!) 59 (!) 50  Resp:  19  Temp:  36.4 C  SpO2: 98% 98%    Last Pain:  Vitals:   05/27/20 1800  TempSrc:   PainSc: 4                  Ralph [name] P [name]

## 2020-05-27 NOTE — Anesthesia Preprocedure Evaluation (Addendum)
Anesthesia Evaluation  Patient identified by MRN, date of birth, ID band Patient confused    Reviewed: Allergy & Precautions, NPO status , Patient's Chart, lab work & pertinent test results  Airway Mallampati: I  TM Distance: >3 FB Neck ROM: Full    Dental  (+) Missing, Chipped, Poor Dentition   Pulmonary Current Smoker,  COVID+   Pulmonary exam normal        Cardiovascular hypertension, Pt. on medications and Pt. on home beta blockers + CAD, + Past MI and + Peripheral Vascular Disease   Rhythm:Regular Rate:Normal     Neuro/Psych Depression CVA    GI/Hepatic Neg liver ROS, GERD  Medicated,(+)     substance abuse  alcohol use, Esophageal stricture, dysphagia    Endo/Other  negative endocrine ROS  Renal/GU negative Renal ROS  negative genitourinary   Musculoskeletal negative musculoskeletal ROS (+)   Abdominal (+)  Abdomen: soft. Bowel sounds: normal.  Peds  Hematology negative hematology ROS (+)   Anesthesia Other Findings   Reproductive/Obstetrics                            Anesthesia Physical Anesthesia Plan  ASA: III  Anesthesia Plan: General   Post-op Pain Management:    Induction: Intravenous and Rapid sequence  PONV Risk Score and Plan: 1 and Ondansetron, Midazolam and Treatment may vary due to age or medical condition  Airway Management Planned: Mask and Oral ETT  Additional Equipment: None  Intra-op Plan:   Post-operative Plan: Extubation in OR  Informed Consent: I have reviewed the patients History and Physical, chart, labs and discussed the procedure including the risks, benefits and alternatives for the proposed anesthesia with the patient or authorized representative who has indicated his/her understanding and acceptance.     Dental advisory given  Plan Discussed with: CRNA  Anesthesia Plan Comments: (ECHO 03/21: 1. Normal LV systolic function; grade 1  diastolic dysfunction.  2. Left ventricular ejection fraction, by estimation, is 60 to 65%. The  left ventricle has normal function. The left ventricle has no regional  wall motion abnormalities. Left ventricular diastolic parameters are  consistent with Grade I diastolic  dysfunction (impaired relaxation).  3. Right ventricular systolic function is normal. The right ventricular  size is normal.  4. The mitral valve is normal in structure. No evidence of mitral valve  regurgitation. No evidence of mitral stenosis.  5. The aortic valve was not well visualized. Aortic valve regurgitation  is not visualized. No aortic stenosis is present.  6. The inferior vena cava is normal in size with greater than 50%  respiratory variability, suggesting right atrial pressure of 3 mmHg.  Lab Results      Component                Value               Date                      WBC                      6.4                 05/27/2020                HGB  14.7                05/27/2020                HCT                      43.5                05/27/2020                MCV                      96.7                05/27/2020                PLT                      193                 05/27/2020           .Lab Results      Component                Value               Date                      NA                       136                 05/27/2020                K                        3.5                 05/27/2020                CO2                      24                  05/27/2020                GLUCOSE                  119 (H)             05/27/2020                BUN                      <5 (L)              05/27/2020                CREATININE               0.57 (L)            05/27/2020                CALCIUM                  8.7 (L)             05/27/2020  GFRNONAA                 >60                 05/27/2020                GFRAA                    >60                  07/10/2019          )       Anesthesia Quick Evaluation

## 2020-05-27 NOTE — Op Note (Addendum)
[facility] Patient Name: Ralph [name] Procedure Date : 05/27/2020 MRN: [id number] Attending MD: Napoleon Form , MD Date of Birth: 1965-03-11 CSN: [id number] Age: 55 Admit Type: Outpatient Procedure:                Upper GI endoscopy Indications:              Dysphagia, Odynophagia, Persistent vomiting of                            unknown cause Providers:                Napoleon Form, MD, Charlett Lango, RN, Rosilyn Mings, Technician Referring MD:              Medicines:                Monitored Anesthesia Care, General Anesthesia Complications:            No immediate complications. Estimated Blood Loss:     Estimated blood loss was minimal. Procedure:                Pre-Anesthesia Assessment:                           - Prior to the procedure, a History and Physical                            was performed, and patient medications and                            allergies were reviewed. The patient's tolerance of                            previous anesthesia was also reviewed. The risks                            and benefits of the procedure and the sedation                            options and risks were discussed with the patient.                            All questions were answered, and informed consent                            was obtained. Prior Anticoagulants: The patient has                            taken no previous anticoagulant or antiplatelet                            agents. ASA Grade Assessment: III - A patient with  severe systemic disease. After reviewing the risks                            and benefits, the patient was deemed in                            satisfactory condition to undergo the procedure.                           After obtaining informed consent, the endoscope was                            passed under direct vision. Throughout the                             procedure, the patient's blood pressure, pulse, and                            oxygen saturations were monitored continuously. The                            GIF-H190 ([id number] Olympus gastroscope was                            introduced through the mouth, and advanced to the                            lower third of esophagus. The upper GI endoscopy                            was performed with difficulty due to abnormal                            anatomy and stenosis. The patient tolerated the                            procedure well. Scope In: Scope Out: Findings:      Food residue and barium was found in the entire esophagus. Removal of       food was accomplished with lavage and suction.      LA Grade D (one or more mucosal breaks involving at least 75% of       esophageal circumference) esophagitis with bleeding was found 26 to 37       cm from the incisors. Biopsies were taken with a cold forceps for       histology.      One severe (stenosis; an endoscope cannot pass) stenosis was found 37 cm       from the incisors. This stenosis measured less than 1 mm (inner       diameter), pin hole. The stenosis was not traversed. Biopsies were taken       with a cold forceps for histology Impression:               - Food in the esophagus. Removal was successful.                           -  LA Grade D erosive esophagitis with bleeding.                            Biopsied.                           - Esophageal stenosis. No obvious mass lesion to                            suggest malignancy Recommendation:           - Clear liquids as tolerated                           - Use Protonix (pantoprazole) 40 mg IV daily.                           - Please consult CT surgery, Dr Cliffton Asters for                            management of esophageal obstruction with tight                            stenosis, not amenable for endoscopic dilation                           - Await pathology results.                            - Will need G- tube or TPN for nutritional support                           - GI is available if needed, please call with any                            questions Procedure Code(s):        --- Professional ---                           [phone], Esophagoscopy, flexible, transoral; with                            removal of foreign body(s)                           43202, Esophagoscopy, flexible, transoral; with                            biopsy, single or multiple Diagnosis Code(s):        --- Professional ---                           A19.379K, Food in esophagus causing other injury,                            initial encounter  K20.81, Other esophagitis with bleeding                           K22.2, Esophageal obstruction                           R13.10, Dysphagia, unspecified                           R11.15, Cyclical vomiting syndrome unrelated to                            migraine CPT copyright 2019 [employer] Medical Association. All rights reserved. The codes documented in this report are preliminary and upon coder review may  be revised to meet current compliance requirements. Napoleon FormKavitha V. Avagail Whittlesey, MD 05/27/2020 4:34:33 PM This report has been signed electronically. Number of Addenda: 0

## 2020-05-27 NOTE — TOC Initial Note (Signed)
Transition of Care [facility]) - Initial/Assessment Note    Patient Details  Name: Ralph [name] MRN: [id number] Date of Birth: 1965-09-23  [facility] CM/SW Contact:    Lockie Pares, RN Phone Number: 05/27/2020, 2:54 PM  Clinical Narrative:                 Admitted with dysphagia and weight loss, history of seeing [facility], however was dismissed from the practice. Undergoing EGD currently, with those results will then plan. Uninsured at present. Lives with SO in [place]. May need MATCH for  medications. Depending on outcome of EGD could send to [facility] for  Medicaid application. CM will follow for needs  Expected Discharge Plan: Home/Self Care Barriers to Discharge: Continued Medical Work up   Patient Goals and CMS Choice        Expected Discharge Plan and Services Expected Discharge Plan: Home/Self Care   Discharge Planning Services: CM Consult   Living arrangements for the past 2 months: Single Family Home                                      Prior Living Arrangements/Services Living arrangements for the past 2 months: Single Family Home Lives with:: Significant Other Patient language and need for interpreter reviewed:: Yes        Need for Family Participation in Patient Care: Yes (Comment) Care giver support system in place?: Yes (comment)   Criminal Activity/Legal Involvement Pertinent to Current Situation/Hospitalization: No - Comment as needed  Activities of Daily Living Home Assistive Devices/Equipment: None ADL Screening (condition at time of admission) Patient's cognitive ability adequate to safely complete daily activities?: Yes Is the patient deaf or have difficulty hearing?: No Does the patient have difficulty seeing, even when wearing glasses/contacts?: No Does the patient have difficulty concentrating, remembering, or making decisions?: No Patient able to express need for assistance with ADLs?: Yes Does the patient have difficulty  dressing or bathing?: No Independently performs ADLs?: Yes (appropriate for developmental age) Does the patient have difficulty walking or climbing stairs?: No Weakness of Legs: None Weakness of Arms/Hands: None  Permission Sought/Granted                  Emotional Assessment       Orientation: : Oriented to Place,Oriented to Self,Oriented to Situation   Psych Involvement: No (comment)  Admission diagnosis:  Cough [R05.9] Dysphagia [R13.10] Dysphagia, unspecified type [R13.10] Patient Active Problem List   Diagnosis Date Noted  . Protein-calorie malnutrition, severe 05/25/2020  . Cough   . Epididymitis 04/06/2020  . Right inguinal hernia 09/14/2019  . Depression, recurrent (HCC) 06/11/2019  . GERD with esophagitis 05/27/2019  . Dehydration 05/27/2019  . Bradycardia 05/04/2019  . Chest pain, unspecified 05/04/2019  . Does not have health insurance   . Dysphagia   . Hyponatremia   . Protein-calorie malnutrition (HCC)   . Tobacco use disorder   . Neuropathic pain 08/29/2017  . High risk social situation 08/29/2017  . 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  . Pure hypercholesterolemia 11/26/2012  . Cerebrovascular disease 11/25/2012  . Peripheral vascular disease, unspecified (HCC) 11/25/2012  . Coronary artery disease 11/25/2012  . Tobacco abuse counseling 11/25/2012  . Essential hypertension, benign 11/25/2012   PCP:  Moses Manners, MD Pharmacy:   [facility] [place], [place] - [address] [place]  [place] [address] Phone: [phone] Fax: [phone]  [facility] - [place], [place] - [address] [place] [address] Phone: [phone] Fax: [phone]     Social Determinants of Health (SDOH) Interventions    Readmission Risk Interventions No flowsheet data found.

## 2020-05-27 NOTE — Progress Notes (Signed)
Family Medicine Teaching Service Daily Progress Note Intern Pager: [phone]  Patient name: Ralph [name] Medical record number: [id number] Date of birth: 1965/02/11 Age: 55 y.o. Gender: male  Primary Care Provider: Moses Manners, MD Consultants: GI Code Status: Full  Pt Overview and Major Events to Date:  4/18: Admitted   Assessment and Plan: Ralph [name] a 54 y.o.malepresenting with dysphagia and weightloss. PMH is significant forgastritis and esophageal stricture, CVA, tobacco use, HTN, HLD.  Dysphagia  Weight loss History of esophageal stricture Patient has extensive GI history.EGD with [place] in August 2021 that demonstrated submucosal thickening of distal esophagus and reactive LNs. Biopsy of distal esophagus on 04/29/19 c/w squamocolumnar junction with focal intestinal metaplasia, negative for malignancy.CT abdomen/pelvis demonstrated no acute abnormality of the chest, abdomen or pelvis. Speech evaluation completed, recommended dysphagia 1 diet. Also noted to have patulous, debris filled esophagus with focal stenosis at the level of the right pulmonary veins. Patient previously followed up with [facility] but after calling the office today, determined that patient has been dismissed from the practice in 2021. Esophogram was technically limited although noted distended debris-filled mid esophagus without passage of contrast into the distal esophagus during course of study. -GI following, appreciate continued involvement and recs.  -f/u EGD results -NPO awaiting EGD - mIVF D5NS 125 ml/hr - vitals per unit routine -IV protonix 40 mg bid - Up with assistance - once diet placed and oral intake improves, monitor Mg and Phosp for refeeding syndrome   COVID-19 infection Denies dyspnea.Incidental finding on admission. Patient endorsing rhinorrhea. CXR demonstrated no active cardiopulmonary disease. S/p 3 day course of remdesivir. - airborne/droplet precautions - continuous  pulse ox x 24 hr  Hypokalemia Resolved.  -monitor BMP  Chest pain Slightly improved but still present. Likely secondary toextensive GI andesophageal history. - IV morphine 1 mg q2h prn - consider CTAchestif clinicallyworsening  Hematuria Noted prior to admission on two separate occasions. UA negative for Hgb.None noted thus far. Urine culture notable for no growth. Not experiencing this since admission.   Cachexia  Protein calorie malnutrition Weight loss in the setting of poor oral intake. -nutrition consulted, appreciate recs   QTc prolongation Prior [id number]. - avoid QTc prolonging medications - monitor EKG  History ofHTN Normotensive this morning, BP 122/86.Not on any home antihypertensives.  - monitor BP  HLD Onatorvastatin, though appears patient is not taking this. -encourage statin therapy outpatient  CVA Patient has history of stroke in 2018 with residual left-sided weakness. Home medications include atorvastatinand ASA, though appears patient is not taking these. -encouraged that patient be on aspirin given CVA history, recommend discussing with PCP outpatient to start on aspirin and statin  Right inguinal hernia Patient has known hernia, diagnosed with varicocele at [facility] recently. He saw Dr. Roe Rutherford and was treated with doxycycline.Patient states that hernia has resolved and he does not have any pain in the area.  Tobacco use disorder Patient reports smoking 1PPD since age of 31, recently cut back to 1/2PPD.  - nicotine patch - consider restarting bupropion   Alcoholuse He reports that he drinks 1-2 40 oz beers per night. Last drink 4/18.Denies history of alcohol withdrawal.Recent CIWA0. - discontinued CIWAs - thiamine  MDD Prescribed bupropion but is not taking at home. -consider restarting bupropion outpatient  FEN/GI: NPO awaiting EGD PPx: lovenox    Status is: Inpatient  Remains inpatient  appropriate because:Ongoing diagnostic testing needed not appropriate for outpatient work up   Dispo: The patient is from: Home  Anticipated d/c is to: Home              Patient currently is not medically stable to d/c.   Difficult to place patient No        Subjective:  No significant overnight events reported. Patient denies dyspnea. Updated patient on recent results, he is aware that he has his EGD scheduled for today. He has no further questions or concerns at this time.  Objective: Temp:  [98 F (36.7 C)-98.6 F (37 C)] 98.2 F (36.8 C) (04/22 0400) Pulse Rate:  [45-63] 45 (04/22 0400) Resp:  [18-20] 18 (04/22 0400) BP: (122-144)/(86-96) 122/86 (04/22 0400) SpO2:  [95 %-100 %] 95 % (04/22 0400) Physical Exam: General: Patient sleeping comfortably in bed, in no acute distress. Cardiovascular: RRR, no murmurs or gallops auscultated Respiratory: CTAB, no wheezing, rales or rhonchi noted, breathing comfortably on room air Abdomen: soft, generalized tenderness on deep palpation, nondistended, BS+ Extremities: radial and distal pulses strong and equal bilaterally, no LE edema noted bilaterally Neuro: alert to voice, appropriately conversational  Psych: mood appropriate  Laboratory: Recent Labs  Lab 05/25/20 0213 05/26/20 0052 05/27/20 0425  WBC 9.3 9.7 6.4  HGB 14.8 13.9 14.7  HCT 44.7 42.1 43.5  PLT 194 229 193   Recent Labs  Lab 05/23/20 2040 05/24/20 0502 05/26/20 0052 05/26/20 2059 05/27/20 0425  NA 133*   < > 137 135 136  K 3.9   < > 2.8* 3.7 3.5  CL 93*   < > 103 103 107  CO2 28   < > 27 25 24   BUN 10   < > <5* <5* <5*  CREATININE 0.76   < > 0.66 0.60* 0.57*  CALCIUM 9.8   < > 8.7* 8.8* 8.7*  PROT 7.2  --   --  5.7*  --   BILITOT 1.2  --   --  0.8  --   ALKPHOS 88  --   --  61  --   ALT 12  --   --  10  --   AST 20  --   --  15  --   GLUCOSE 79   < > 99 106* 119*   < > = values in this interval not displayed.       Imaging/Diagnostic Tests: DG ESOPHAGUS W SINGLE CM (SOL OR THIN BA)  Result Date: 05/26/2020 CLINICAL DATA:  Unable to swallow EXAM: ESOPHOGRAM/BARIUM SWALLOW TECHNIQUE: Single contrast examination was performed using  thin barium. FLUOROSCOPY TIME:  Fluoroscopy Time:  30 seconds Radiation Exposure Index (if provided by the fluoroscopic device): 1.5 mGy COMPARISON:  None. FINDINGS: Patient swallowed contrast via stroke in a semi supine position. Only a small volume of contrast was tolerated. There is distension of the mid esophagus with intraluminal debris. During the course of study, there is no passage contrast into the distal esophagus. No aspiration. IMPRESSION: Technically limited evaluation. Distended, debris-filled mid esophagus. No passage of contrast into the distal esophagus during course of study. Electronically Signed   By: 05/28/2020 M.D.   On: 05/26/2020 14:40    05/28/2020, DO 05/27/2020, 6:21 AM PGY-1, [facility] Family Medicine FPTS Intern pager: [phone], text pages welcome

## 2020-05-28 LAB — BASIC METABOLIC PANEL
Anion gap: 5 (ref 5–15)
BUN: <5 mg/dL — ABNORMAL LOW (ref 6–20)
CO2: 22 mmol/L (ref 22–32)
Calcium: 8.4 mg/dL — ABNORMAL LOW (ref 8.9–10.3)
Chloride: 109 mmol/L (ref 98–111)
Creatinine, Ser: 0.53 mg/dL — ABNORMAL LOW (ref 0.61–1.24)
GFR, Estimated: >60 mL/min (ref >60)
Glucose, Bld: 127 mg/dL — ABNORMAL HIGH (ref 70–99)
Potassium: 3.3 mmol/L — ABNORMAL LOW (ref 3.5–5.1)
Sodium: 136 mmol/L (ref 135–145)

## 2020-05-28 LAB — CBC
HCT: 40.3 % (ref 39.0–52.0)
Hemoglobin: 13.5 g/dL (ref 13.0–17.0)
MCH: 32.8 pg (ref 26.0–34.0)
MCHC: 33.5 g/dL (ref 30.0–36.0)
MCV: 97.8 fL (ref 80.0–100.0)
Platelets: 181 10*3/uL (ref 150–400)
RBC: 4.12 MIL/uL — ABNORMAL LOW (ref 4.22–5.81)
RDW: 14.3 % (ref 11.5–15.5)
WBC: 5.8 10*3/uL (ref 4.0–10.5)
nRBC: 0 % (ref 0.0–0.2)

## 2020-05-28 LAB — MAGNESIUM: Magnesium: 1.5 mg/dL — ABNORMAL LOW (ref 1.7–2.4)

## 2020-05-28 LAB — PHOSPHORUS: Phosphorus: 2.7 mg/dL (ref 2.5–4.6)

## 2020-05-28 MED ORDER — MORPHINE SULFATE (PF) 2 MG/ML IV SOLN: 1.0000 mg | Freq: Once | INTRAVENOUS | Status: DC

## 2020-05-28 MED ORDER — MORPHINE SULFATE (PF) 2 MG/ML IV SOLN
2.0000 mg | INTRAVENOUS | Status: DC | PRN
  Administered 2020-05-28 – 2020-05-29 (×11): 2 mg via INTRAVENOUS
  Filled 2020-05-28 (×11): qty 1

## 2020-05-28 MED ORDER — BOOST / RESOURCE BREEZE PO LIQD CUSTOM
1.0000 | Freq: Three times a day (TID) | ORAL | Status: DC
  Administered 2020-05-28 – 2020-05-30 (×5): 1 via ORAL

## 2020-05-28 MED ORDER — PANTOPRAZOLE SODIUM 40 MG IV SOLR
40.0000 mg | Freq: Every day | INTRAVENOUS | Status: DC
  Administered 2020-05-28 – 2020-05-30 (×3): 40 mg via INTRAVENOUS
  Filled 2020-05-28 (×3): qty 40

## 2020-05-28 NOTE — Consult Note (Signed)
Reason for Consult:esophageal stricture requiring G-tube placement Referring Physician: Nestor RampSara L Neal, MD  HPI:  Ralph [name] is an 55 y.o. 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  His past surgical history is notable for open appendectomy during childhood.  He was admitted to the hospital on 05/23/2020 with dysphagia and weightloss.   His esophageal symptoms go back to about 2 years ago when he was first found to have a stricture.  He underwent EGD in August 2021 by Dr. Ewing SchleinMagod.  During that time he was dilated with TTS balloon to 18 mm. Esophageal biopsies negative for eosinophilic esophagitis, dysplasia or malignancy.   He again underwent EGD yesterday which again revealed a stenosis and esophagitis with contact bleeding. Food residue was removed from the esophagus. The stenosis measured less than 1mm and was not traversed. Biopsy results pending.  At this point he has difficulties even tolerating his secretions and is unable to eat and barely drink.  He has lost approximately 50lbs in the last year of which 20lbs were lost in the last month or 2.  Of note, he was also found to be COVID-positive upon hospital admission on 05/23/2020.  General Surgery was consulted for laparoscopic G-tube placement.   Past Medical History:  Diagnosis Date  . Coronary artery disease   . Myocardial infarction (HCC)   . Stroke [facility](HCC)     Past Surgical History:  Procedure Laterality Date  . APPENDECTOMY    . BALLOON DILATION N/A 06/01/2019   Procedure: BALLOON DILATION;  Surgeon: Vida RiggerMagod, Marc, MD;  Location: [facility];  Service: Endoscopy;  Laterality: N/A;  . BALLOON DILATION N/A 07/16/2019   Procedure: BALLOON DILATION;  Surgeon: Vida RiggerMagod, Marc, MD;  Location: [facility];  Service: Endoscopy;  Laterality: N/A;  . BALLOON DILATION N/A 09/09/2019   Procedure: BALLOON DILATION;  Surgeon: Vida RiggerMagod, Marc, MD;  Location: [facility];  Service: Endoscopy;  Laterality:  N/A;  . BIOPSY  04/29/2019   Procedure: BIOPSY;  Surgeon: Charlott RakesSchooler, Vincent, MD;  [facility];  Service: Endoscopy;;  . BIOPSY  05/27/2019   Procedure: BIOPSY;  Surgeon: Willis Modenautlaw, William, MD;  Location: [facility];  Service: Endoscopy;;  . BIOPSY  06/01/2019   Procedure: BIOPSY;  Surgeon: Vida RiggerMagod, Marc, MD;  [facility]: [facility];  Service: Endoscopy;;  . BIOPSY  07/16/2019   Procedure: BIOPSY;  Surgeon: Vida RiggerMagod, Marc, MD;  [facility]: [facility];  Service: Endoscopy;;  . ESOPHAGOGASTRODUODENOSCOPY N/A 06/01/2019   Procedure: ESOPHAGOGASTRODUODENOSCOPY (EGD);  Surgeon: Vida RiggerMagod, Marc, MD;  [facility];  Service: Endoscopy;  Laterality: N/A;  . ESOPHAGOGASTRODUODENOSCOPY (EGD) WITH PROPOFOL N/A 04/29/2019   Procedure: ESOPHAGOGASTRODUODENOSCOPY (EGD) WITH PROPOFOL;  Surgeon: Charlott RakesSchooler, Vincent, MD;  [facility];  Service: Endoscopy;  Laterality: N/A;  . ESOPHAGOGASTRODUODENOSCOPY (EGD) WITH PROPOFOL N/A 05/27/2019   Procedure: ESOPHAGOGASTRODUODENOSCOPY (EGD) WITH PROPOFOL;  Surgeon: Willis Modenautlaw, William, MD;  [facility];  Service: Endoscopy;  Laterality: N/A;  . ESOPHAGOGASTRODUODENOSCOPY (EGD) WITH PROPOFOL N/A 07/16/2019   Procedure: ESOPHAGOGASTRODUODENOSCOPY (EGD) WITH PROPOFOL;  Surgeon: Vida RiggerMagod, Marc, MD;  [facility];  Service: Endoscopy;  Laterality: N/A;  . ESOPHAGOGASTRODUODENOSCOPY (EGD) WITH PROPOFOL N/A 09/09/2019   Procedure: ESOPHAGOGASTRODUODENOSCOPY (EGD) WITH PROPOFOL;  Surgeon: Vida RiggerMagod, Marc, MD;  [facility];  Service: Endoscopy;  Laterality: N/A;  . FINE NEEDLE ASPIRATION  09/09/2019   Procedure: FINE NEEDLE ASPIRATION (FNA) LINEAR;  Surgeon: Vida RiggerMagod, Marc, MD;  Location: [facility];  Service: Endoscopy;;  . FOREIGN BODY REMOVAL  07/16/2019  Procedure: FOREIGN BODY REMOVAL;  Surgeon: Vida Rigger, MD;  [facility]: [facility];  Service: Endoscopy;;  . SHOULDER SURGERY    . UPPER ESOPHAGEAL ENDOSCOPIC ULTRASOUND (EUS) N/A 09/09/2019    Procedure: UPPER ESOPHAGEAL ENDOSCOPIC ULTRASOUND (EUS);  Surgeon: [name], MD;  [facility];  Service: Endoscopy;  Laterality: N/A;    No family history on file.  Social History:  reports that he has been smoking cigarettes. He has a 30.00 pack-year smoking history. He has never used smokeless tobacco. He reports current alcohol use of about 1.0 standard drink of alcohol per week. He reports current drug use. Drug: Cocaine.  Allergies: No Known Allergies  Medications: I have reviewed the patient's current medications.  Results for orders placed or performed during the hospital encounter of 05/23/20 (from the past 48 hour(s))  Comprehensive metabolic panel     Status: Abnormal   Collection Time: 05/26/20  8:59 PM  Result Value Ref Range   Sodium 135 135 - 145 mmol/L   Potassium 3.7 3.5 - 5.1 mmol/L   Chloride 103 98 - 111 mmol/L   CO2 25 22 - 32 mmol/L   Glucose, Bld 106 (H) 70 - 99 mg/dL    Comment: Glucose reference range applies only to samples taken after fasting for at least 8 hours.   BUN <5 (L) 6 - 20 mg/dL   Creatinine, Ser 0.26 (L) 0.61 - 1.24 mg/dL   Calcium 8.8 (L) 8.9 - 10.3 mg/dL   Total Protein 5.7 (L) 6.5 - 8.1 g/dL   Albumin 3.0 (L) 3.5 - 5.0 g/dL   AST 15 15 - 41 U/L   ALT 10 0 - 44 U/L   Alkaline Phosphatase 61 38 - 126 U/L   Total Bilirubin 0.8 0.3 - 1.2 mg/dL   GFR, Estimated >37 >85 mL/min    Comment: (NOTE) Calculated using the CKD-EPI Creatinine Equation (2021)    Anion gap 7 5 - 15    Comment: Performed at [facility], [address] [place], [place] [address]  Basic metabolic panel     Status: Abnormal   Collection Time: 05/27/20  4:25 AM  Result Value Ref Range   Sodium 136 135 - 145 mmol/L   Potassium 3.5 3.5 - 5.1 mmol/L   Chloride 107 98 - 111 mmol/L   CO2 24 22 - 32 mmol/L   Glucose, Bld 119 (H) 70 - 99 mg/dL    Comment: Glucose reference range applies only to samples taken after fasting for at least 8 hours.   BUN  <5 (L) 6 - 20 mg/dL   Creatinine, Ser 7.74 (L) 0.61 - 1.24 mg/dL   Calcium 8.7 (L) 8.9 - 10.3 mg/dL   GFR, Estimated >12 >87 mL/min    Comment: (NOTE) Calculated using the CKD-EPI Creatinine Equation (2021)    Anion gap 5 5 - 15    Comment: Performed at [facility], [address] [place], [place] [address]  CBC     Status: None   Collection Time: 05/27/20  4:25 AM  Result Value Ref Range   WBC 6.4 4.0 - 10.5 K/uL   RBC 4.50 4.22 - 5.81 MIL/uL   Hemoglobin 14.7 13.0 - 17.0 g/dL   HCT 20.9 47.0 - 96.2 %   MCV 96.7 80.0 - 100.0 fL   MCH 32.7 26.0 - 34.0 pg   MCHC 33.8 30.0 - 36.0 g/dL   RDW 83.6 62.9 - 47.6 %   Platelets 193 150 - 400  K/uL   nRBC 0.0 0.0 - 0.2 %    Comment: Performed at [facility], [address] [place], [place] [address]  Magnesium     Status: Abnormal   Collection Time: 05/28/20  1:05 AM  Result Value Ref Range   Magnesium 1.5 (L) 1.7 - 2.4 mg/dL    Comment: Performed at [facility], [address], [place], [place] [address]  Phosphorus     Status: None   Collection Time: 05/28/20  1:05 AM  Result Value Ref Range   Phosphorus 2.7 2.5 - 4.6 mg/dL    Comment: Performed at [facility], [address], [place], [place] [address]  Basic metabolic panel     Status: Abnormal   Collection Time: 05/28/20  1:05 AM  Result Value Ref Range   Sodium 136 135 - 145 mmol/L   Potassium 3.3 (L) 3.5 - 5.1 mmol/L   Chloride 109 98 - 111 mmol/L   CO2 22 22 - 32 mmol/L   Glucose, Bld 127 (H) 70 - 99 mg/dL    Comment: Glucose reference range applies only to samples taken after fasting for at least 8 hours.   BUN <5 (L) 6 - 20 mg/dL   Creatinine, Ser 2.22 (L) 0.61 - 1.24 mg/dL   Calcium 8.4 (L) 8.9 - 10.3 mg/dL   GFR, Estimated >97 >98 mL/min    Comment: (NOTE) Calculated using the CKD-EPI Creatinine Equation (2021)    Anion gap 5 5 - 15    Comment: Performed at [facility], [address], [place], [place] [address]  CBC     Status:  Abnormal   Collection Time: 05/28/20  1:05 AM  Result Value Ref Range   WBC 5.8 4.0 - 10.5 K/uL   RBC 4.12 (L) 4.22 - 5.81 MIL/uL   Hemoglobin 13.5 13.0 - 17.0 g/dL   HCT 41.7 40.8 - 14.4 %   MCV 97.8 80.0 - 100.0 fL   MCH 32.8 26.0 - 34.0 pg   MCHC 33.5 30.0 - 36.0 g/dL   RDW 81.8 56.3 - 14.9 %   Platelets 181 150 - 400 K/uL   nRBC 0.0 0.0 - 0.2 %    Comment: Performed at [facility], [address] [place], [place] [address]    DG ESOPHAGUS W SINGLE CM (SOL OR THIN BA)  Result Date: 05/26/2020 CLINICAL DATA:  Unable to swallow EXAM: ESOPHOGRAM/BARIUM SWALLOW TECHNIQUE: Single contrast examination was performed using  thin barium. FLUOROSCOPY TIME:  Fluoroscopy Time:  30 seconds Radiation Exposure Index (if provided by the fluoroscopic device): 1.5 mGy COMPARISON:  None. FINDINGS: Patient swallowed contrast via stroke in a semi supine position. Only a small volume of contrast was tolerated. There is distension of the mid esophagus with intraluminal debris. During the course of study, there is no passage contrast into the distal esophagus. No aspiration. IMPRESSION: Technically limited evaluation. Distended, debris-filled mid esophagus. No passage of contrast into the distal esophagus during course of study. Electronically Signed   By: Guadlupe Spanish M.D.   On: 05/26/2020 14:40    Review of Systems  Constitutional: Positive for activity change, appetite change, fatigue and unexpected weight change.  HENT: Positive for trouble swallowing.   Eyes: Negative.   Respiratory: Negative.   Cardiovascular: Negative.   Gastrointestinal: Positive for nausea.  Endocrine: Negative.   Genitourinary: Negative.    Blood pressure 128/80, pulse (!) 55, temperature 98 F (36.7 C), temperature source Axillary,  resp. rate 18, height 5\' 9"  (1.753 m), weight 54.9 kg, SpO2 96 %. Physical Exam Constitutional:      Appearance: He is ill-appearing.  Cardiovascular:     Rate and Rhythm: Normal rate  and regular rhythm.  Pulmonary:     Effort: Pulmonary effort is normal.  Abdominal:     General: Abdomen is flat.     Palpations: Abdomen is soft.     Tenderness: There is no abdominal tenderness.  Neurological:     Mental Status: He is alert.     Assessment/Plan: Ralph [name] is an 55 y.o. 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  His past surgical history is notable for open appendectomy during childhood.   He has a history of a benign esophageal stricture that goes back to about 2 years.  He underwent multiple upper endoscopies and dilations for this problem.  Now he was admitted with worsening dysphagia to a point that he can barely tolerate his secretions.  He does not tolerate any food and barely any liquids.  He has lost approximately 50 lbs in the last year.  He underwent upper endoscopy yesterday during which food residue was cleared from his esophagus and a pinpoint hole was identified in the area of the stricture.  The endoscopist was unable to pass the stricture and he is no longer a candidate for dilations.  At this point he likely needs surgical management of this esophageal stricture by thoracic surgery.  Dr. 44 will be contacted and involved in his care to explore this option.  In the meantime he will need nutrition which can be provided through enteral or parenteral route.  The enteral route is definitely favored and the question remains whether or not a G-tube or J-tube would be the best option for him.  If he is a candidate for esophagectomy and esophageal replacement with a gastric conduit, a J-tube is likely the better option to avoid instrumentation the stomach which will serve as the conduit. We will discuss this in more detail with Dr. Cliffton Asters and come up with a unified plan for durable feeding access.  General surgery will continue to follow.  Please do not hesitate to page or call with any questions or  concerns.  Cliffton Asters 05/28/2020, 1:39 PM

## 2020-05-28 NOTE — Progress Notes (Signed)
CALL PAGER [phone] for any questions or notifications regarding this patient  FMTS Attending Note: [name] MD Spoke with GI Dr Lavon Paganini and they would not be able to do G tube as he has almost completely obstructed esophagus, so we will consult gen surgery. We had already put in a consult for CT surgery. GI reports they spoke extensively and in detail about his scope results; unfortunately, he did not remember much os this detail this morning and Dr. Robyne Peers spoe to him again this AM. Appreciate GI assistance and care. GI also said it was Ok to try some clear liquids as he has a small pin hole opening in distal esophagus.

## 2020-05-28 NOTE — Progress Notes (Addendum)
FPTS Interim Progress Note  Went to bedside to assess patient, patient in 10/10 chest pain (tender on palpation of chest wall) that he chronically has that may have worsened after EGD. Patient states that he had been trying to press his call bell and waiting for nurse to assist for over an hour by the time I came into the room. Primary team was not notified of this, increased morphine order and updated nurse on this. Also informed nurse that primary care team would like to be aware of any updates such as this in the future. Full daily progress note to follow.   Reece Leader, DO 05/28/2020, 11:44 AM PGY-1, [facility] Family Medicine Service pager [phone]

## 2020-05-28 NOTE — Progress Notes (Signed)
Family Medicine Teaching Service Daily Progress Note Intern Pager: [phone]  Patient name: Ralph [name] Medical record number: [id number] Date of birth: Jun 18, 1965 Age: 55 y.o. Gender: male  Primary Care Provider: Moses Manners, MD Consultants: GI Code Status: Full  Pt Overview and Major Events to Date:  4/18: Admitted   Assessment and Plan: Ralph [name] a 54 y.o.malepresenting with dysphagia and weightloss. PMH is significant forgastritis and esophageal stricture, CVA, tobacco use, HTN, HLD.  Dysphagia  Weight lossHistory of esophageal stricture Patient has extensive GI history.EGD with [place] in August 2021 that demonstrated submucosal thickening of distal esophagus and reactive LNs. Biopsy of distal esophagus on 04/29/19 c/w squamocolumnar junction with focal intestinal metaplasia, negative for malignancy.CT abdomen/pelvis demonstrated no acute abnormality of the chest, abdomen or pelvis. Speech evaluation completed, recommended dysphagia 1 diet. Also noted to have patulous, debris filled esophagus with focal stenosis at the level of the right pulmonary veins. Patient previously followed up with [facility] but after calling the office today, determined that patient has been dismissed from the practice in 2021. Esophogram was technically limited although noted distended debris-filled mid esophagus without passage of contrast into the distal esophagus during course of study. EGD demonstrated food residue and barium within the entire esophagus with successful removal of food. Also noted to have grade D erosive esophagitis with bleeding and esophageal stenosis, not suggestive of malignancy. Biopsies taken. -GI following, appreciate continued involvement and recs, recs include total parenteral nutrition and G-tube placement -consult cardiothoracic surgery for G-tube placement  -awaiting biopsy results -clear liquid diet, advance as tolerated  - mIVF D5NS 125 ml/hr -IV protonix 40 mg  daily per GI - Up with assistance - once diet placed and oral intake improves, monitor Mg and Phosp for refeeding syndrome  -likely benefit from GI outpatient follow up after discharge   COVID-19 infection Denies dyspnea.Incidental finding on admission. Patient endorsing rhinorrhea. CXR demonstrated no active cardiopulmonary disease. S/p 3 day course of remdesivir. - airborne/droplet precautions - continuous pulse ox x 24 hr  Hypokalemia Resolved.  -monitor BMP  Chest pain Seems to have worsened, possibly from the EGD. Likely secondary toextensive GI andesophageal history. - IV morphine 2 mg q2h prn, increased 4/23 - consider CTAchestif clinicallyworsening  Cachexia  Protein calorie malnutrition Weight loss in the setting of poor oral intake. -nutrition consulted, appreciate recs   QTc prolongation Prior reading470. - avoid QTc prolonging medications - monitor EKG  History ofHTN Normotensive this morning, BP115/71.Not on any home antihypertensives.  - monitor BP  HLD Onatorvastatin, though appears patient is not taking this. -encourage statin therapy outpatient  CVA Patient has history of stroke in 2018 with residual left-sided weakness. Home medications include atorvastatinand ASA, though appears patient is not taking these. -encouraged that patient be on aspirin given CVA history, recommend discussing with PCP outpatient to start on aspirin and statin  Right inguinal hernia Patient has known hernia, diagnosed with varicocele at [facility] recently. He saw Dr. Roe Rutherford and was treated with doxycycline which resolved his pain at the time.   Tobacco use disorder Patient reports smoking 1PPD since age of 60, recently cut back to 1/2PPD.  - nicotine patch - consider restarting bupropion   Alcoholuse He reports that he drinks 1-2 40 oz beers per night. Last drink 4/18.Denies history of alcohol withdrawal. -  thiamine  MDD Prescribed bupropion but is not taking at home. -consider restarting bupropion outpatient  FEN/GI: NPO per GI PPx: lovenox    Status is: Inpatient  Remains inpatient appropriate because:Inpatient level of care appropriate due to severity of illness   Dispo: The patient is from: Home              Anticipated d/c is to: Home              Patient currently is not medically stable to d/c.   Difficult to place patient No        Subjective:  No acute events reported overnight. Patient was not aware of EGD results, I discussed this with him at bedside. He started to tear up as he stated that it was the first time he had heard of these results. Updated on current plan and reassurance provided. Also endorsed chest pain consistent with his previous pain since this morning.   Objective: Temp:  [97.6 F (36.4 C)-98.1 F (36.7 C)] 98 F (36.7 C) (04/23 0523) Pulse Rate:  [42-59] 42 (04/23 0523) Resp:  [18-29] 18 (04/23 0523) BP: (108-142)/(68-99) 115/71 (04/23 0523) SpO2:  [96 %-99 %] 97 % (04/23 0523) Physical Exam: General: Patient sitting upright in bed, appears in mild distress from pain. Cardiovascular: RRR, no murmurs or gallops auscultated  Respiratory: CTAB, breathing comfortably on room air  Abdomen: soft, generalized tenderness on palpaion, BS+ MSK: left sided chest wall tenderness on palpation  Extremities: radial and distal pulses strong and equal bilaterally, no LE edema noted  Psych: mood appropriate but tearful at times  Laboratory: Recent Labs  Lab 05/26/20 0052 05/27/20 0425 05/28/20 0105  WBC 9.7 6.4 5.8  HGB 13.9 14.7 13.5  HCT 42.1 43.5 40.3  PLT 229 193 181   Recent Labs  Lab 05/23/20 2040 05/24/20 0502 05/26/20 2059 05/27/20 0425 05/28/20 0105  NA 133*   < > 135 136 136  K 3.9   < > 3.7 3.5 3.3*  CL 93*   < > 103 107 109  CO2 28   < > 25 24 22   BUN 10   < > <5* <5* <5*  CREATININE 0.76   < > 0.60* 0.57* 0.53*  CALCIUM  9.8   < > 8.8* 8.7* 8.4*  PROT 7.2  --  5.7*  --   --   BILITOT 1.2  --  0.8  --   --   ALKPHOS 88  --  61  --   --   ALT 12  --  10  --   --   AST 20  --  15  --   --   GLUCOSE 79   < > 106* 119* 127*   < > = values in this interval not displayed.      Imaging/Diagnostic Tests: No results found.  , DO 05/28/2020, 8:24 AM PGY-1, [facility] Family Medicine FPTS Intern pager: [phone], text pages welcome

## 2020-05-29 ENCOUNTER — Inpatient Hospital Stay (HOSPITAL_COMMUNITY): Payer: Medicaid Other

## 2020-05-29 LAB — CBC
HCT: 42.4 % (ref 39.0–52.0)
Hemoglobin: 14 g/dL (ref 13.0–17.0)
MCH: 34.1 pg — ABNORMAL HIGH (ref 26.0–34.0)
MCHC: 33 g/dL (ref 30.0–36.0)
MCV: 103.4 fL — ABNORMAL HIGH (ref 80.0–100.0)
Platelets: 197 10*3/uL (ref 150–400)
RBC: 4.1 MIL/uL — ABNORMAL LOW (ref 4.22–5.81)
RDW: 14.2 % (ref 11.5–15.5)
WBC: 14 10*3/uL — ABNORMAL HIGH (ref 4.0–10.5)
nRBC: 0 % (ref 0.0–0.2)

## 2020-05-29 LAB — BASIC METABOLIC PANEL
Anion gap: 9 (ref 5–15)
BUN: <5 mg/dL — ABNORMAL LOW (ref 6–20)
CO2: 21 mmol/L — ABNORMAL LOW (ref 22–32)
Calcium: 8.5 mg/dL — ABNORMAL LOW (ref 8.9–10.3)
Chloride: 102 mmol/L (ref 98–111)
Creatinine, Ser: 0.62 mg/dL (ref 0.61–1.24)
GFR, Estimated: >60 mL/min (ref >60)
Glucose, Bld: 81 mg/dL (ref 70–99)
Potassium: 4.2 mmol/L (ref 3.5–5.1)
Sodium: 132 mmol/L — ABNORMAL LOW (ref 135–145)

## 2020-05-29 LAB — MAGNESIUM: Magnesium: 1.4 mg/dL — ABNORMAL LOW (ref 1.7–2.4)

## 2020-05-29 LAB — PHOSPHORUS: Phosphorus: 2.4 mg/dL — ABNORMAL LOW (ref 2.5–4.6)

## 2020-05-29 MED ORDER — MAGNESIUM SULFATE 2 GM/50ML IV SOLN
2.0000 g | Freq: Once | INTRAVENOUS | Status: AC
  Administered 2020-05-29: 2 g via INTRAVENOUS
  Filled 2020-05-29: qty 50

## 2020-05-29 MED ORDER — SODIUM CHLORIDE 0.9 % IV SOLN
3.0000 g | Freq: Three times a day (TID) | INTRAVENOUS | Status: DC
  Administered 2020-05-30 – 2020-06-01 (×7): 3 g via INTRAVENOUS
  Filled 2020-05-29 (×3): qty 8
  Filled 2020-05-29 (×2): qty 3
  Filled 2020-05-29 (×2): qty 8
  Filled 2020-05-29: qty 3
  Filled 2020-05-29: qty 8

## 2020-05-29 MED ORDER — MORPHINE SULFATE (PF) 2 MG/ML IV SOLN
2.0000 mg | INTRAVENOUS | Status: DC | PRN
Start: 2020-05-29 — End: 2020-05-31
  Administered 2020-05-29 – 2020-05-31 (×16): 2 mg via INTRAVENOUS
  Filled 2020-05-29 (×16): qty 1

## 2020-05-29 NOTE — Progress Notes (Addendum)
Family Medicine Teaching Service Daily Progress Note Intern Pager: [phone]  Patient name: Ralph [name] Medical record number: [id number] Date of birth: November 18, 1965 Age: 55 y.o. Gender: male  Primary Care Provider: Moses Manners, MD Consultants: GI Code Status: Full  Pt Overview and Major Events to Date:  4/18: Admitted   Assessment and Plan: Ralph [name] a 55 y.o.malepresenting with dysphagia and weightloss. PMH is significant forgastritis and esophageal stricture, CVA, tobacco use, HTN, HLD.  Dysphagia  Weight lossEsophageal stricture EGD demonstrated food residue and barium within the entire esophagus with successful removal of food and significant stricture not amenable to dilation. Also noted to have grade D erosive esophagitis with bleeding and esophageal stenosis, not suggestive of malignancy. Biopsies taken. -GI following, appreciate continued involvement and recs, recs include total parenteral nutrition and G-tube placement -Cardiothoracic surgery consulted for esophageal stricture -General surgery consulted for G-tube versus J-tube placement -awaiting biopsy results.  Per Dr. Lavon Paganini patient would not be able to get a G-tube as he has almost complete obstruction of esophagus -clear liquid diet, advance as tolerated  - mIVF D5NS 125 ml/hr -IV protonix 40 mg daily per GI - Up with assistance - once diet placed and oral intake improves, monitor Mg and Phosp for refeeding syndrome  -likely benefit from GI outpatient follow up after discharge   COVID-19 infection Denies dyspnea.Incidental finding on admission. Patient endorsing rhinorrhea. CXR demonstrated no active cardiopulmonary disease. S/p 3 day course of remdesivir. - airborne/droplet precautions - continuous pulse ox x 24 hr  Hypokalemia Resolved.   A.m. potassium 4.2 -monitor BMP  Chest pain Seems to have worsened, possibly from the EGD. Likely secondary toextensive GI andesophageal  history. - IV morphine 2 mg q2h prn, increased 4/23 - consider CTAchestif clinicallyworsening  Cachexia  Protein calorie malnutrition Weight loss in the setting of poor oral intake.Magnesium 1.4 - replete magnesium -nutrition consulted, appreciate recs   QTc prolongation Prior reading470. - avoid QTc prolonging medications - monitor EKG  History ofHTN Most recent BP 171/99. Not on any home antihypertensives.  -Continue to monitor BP  HLD Onatorvastatin, though appears patient is not taking this. -encourage statin therapy outpatient  CVA Patient has history of stroke in 2018 with residual left-sided weakness. Home medications include atorvastatinand ASA, though appears patient is not taking these. -encouraged that patient be on aspirin given CVA history, recommend discussing with PCP outpatient to start on aspirin and statin  Right inguinal hernia Patient has known hernia, diagnosed with varicocele at [facility] recently. He saw Dr. Roe Rutherford and was treated with doxycycline which resolved his pain at the time.   Tobacco use disorder Patient reports smoking 1PPD since age of 21, recently cut back to 1/2PPD.  - nicotine patch - consider restarting bupropion   Alcoholuse He reports that he drinks 1-2 40 oz beers per night. Last drink 4/18.Denies history of alcohol withdrawal. - thiamine  MDD Prescribed bupropion but is not taking at home. -consider restarting bupropion outpatient  FEN/GI: Clear liquids per GI PPx: lovenox    Status is: Inpatient  Remains inpatient appropriate because:Inpatient level of care appropriate due to severity of illness   Dispo: The patient is from: Home              Anticipated d/c is to: Home              Patient currently is not medically stable to d/c.   Difficult to place patient No  Subjective:  Patient with no complaints at  this time.  States he is breathing well and continues to do so on room air.   He states he has been able to keep some of the clear liquids down but has not been able to successfully drink the Ensure drinks.  States that surgery did speak with him earlier today and was coordinating working with thoracic surgery on next steps as far as a J-tube versus other feeding source.  Objective: Temp:  [98.4 F (36.9 C)-99.1 F (37.3 C)] 99.1 F (37.3 C) (04/23 2050) Pulse Rate:  [52-76] 76 (04/23 2050) Resp:  [18] 18 (04/23 2050) BP: (113-181)/(77-101) 171/99 (04/23 2050) SpO2:  [93 %-98 %] 93 % (04/23 2050) Physical Exam: General: Alert and oriented in no apparent distress, cachectic in appearance Heart: Regular rate and rhythm, no murmurs Lungs: Patient with cough but lungs clear to auscultation on room air Abdomen: Bowel sounds present, no abdominal pain Skin: Warm and dry Extremities: No lower extremity edema  Laboratory: Recent Labs  Lab 05/27/20 0425 05/28/20 0105 05/29/20 0107  WBC 6.4 5.8 14.0*  HGB 14.7 13.5 14.0  HCT 43.5 40.3 42.4  PLT 193 181 197   Recent Labs  Lab 05/23/20 2040 05/24/20 0502 05/26/20 2059 05/27/20 0425 05/28/20 0105 05/29/20 0107  NA 133*   < > 135 136 136 132*  K 3.9   < > 3.7 3.5 3.3* 4.2  CL 93*   < > 103 107 109 102  CO2 28   < > 25 24 22  21*  BUN 10   < > <5* <5* <5* <5*  CREATININE 0.76   < > 0.60* 0.57* 0.53* 0.62  CALCIUM 9.8   < > 8.8* 8.7* 8.4* 8.5*  PROT 7.2  --  5.7*  --   --   --   BILITOT 1.2  --  0.8  --   --   --   ALKPHOS 88  --  61  --   --   --   ALT 12  --  10  --   --   --   AST 20  --  15  --   --   --   GLUCOSE 79   < > 106* 119* 127* 81   < > = values in this interval not displayed.    Imaging/Diagnostic Tests: No results found.  , DO 05/29/2020, 7:38 AM PGY-2, [facility] FPTS Intern pager: [phone], text pages welcome

## 2020-05-29 NOTE — Progress Notes (Signed)
FPTS Interim Progress Note  S: Per chart review, patient noted to be febrile at 102.7. Called nurse to see how patient appeared, nurse reported that patient felt fine with stable vitals. Given tylenol and temp improved to 100.5. Went to bedside along with Dr. Leary Roca to assess patient. Patient denies any dyspnea or worsening cough at this time. States that he was on his phone earlier and felt his body feel warm but now feels better. Denies any concerns at this time.   O: BP 135/89 (BP Location: Right Arm)   Pulse 78   Temp (!) 100.5 F (38.1 C) (Oral)   Resp 20   Ht 5\' 9"  (1.753 m)   Wt 54.9 kg   SpO2 98%   BMI 17.87 kg/m   General: Patient sitting upright in bed, in no acute distress. CV: RRR, no murmurs or gallops auscultated  Resp: CTAB, no wheezing, rales or rhonchi noted, good air movement throughout all lung fields posteriorly without focal findings, breathing comfortably on room ar Psych: mood appropriate   A/P: -patient is COVID positive, incidental finding on admission but patient has yet to be significantly symptomatic without leukocytosis noted on admission -leukocytosis noted this morning on labs with significant elevation of WBC to 14 this morning, concerning since patient has not had elevated WBC throughout this hospitalization although COVID positive -patient appears well which is reassuring but given now febrile status along with leukocytosis this morning, will obtain blood cultures and CXR -continue to monitor vitals and follow up on workup  -remainder of plan as previously discussed   , DO 05/29/2020, 9:48 PM PGY-1, [facility] Family Medicine Service pager [phone]

## 2020-05-29 NOTE — Progress Notes (Signed)
Notified by elink RN Dahlia Bailiff for concern of patients SBP. MD aware, PRN pain regimen adjusted. Patient is asymptomatic, no c/o currently. HR in the 60s, afebrile. MD aware of WBC jump from 5.8 to 14. Orders to continue to monitor.

## 2020-05-29 NOTE — Hospital Course (Addendum)
Ralph [name] is a 55 y.o. male presenting with dysphagia and weightloss. PMH is significant for gastritis and esophageal stricture, CVA, tobacco use, HTN, HLD.   Esophageal stricture CT abdomen/pelvis revealed patulous, debris filled esophagus with focal stenosis at the level of the right pulmonary veins.  Started on IV fluids given minimal oral intake. Esophogram was technically limited although noted distended debris-filled mid esophagus without passage of contrast into the distal esophagus during course of study. EGD demonstrated food residue and barium within the entire esophagus with successful removal of food, also noted to have grade D erosive esophagitis with bleeding and esophageal stenosis not amenable to dilation. Biopsy negative for malignancy.  On 4/26, EGD was attempted with unsuccessful NG tube placement, so underwent laparoscopic jejunostomy and he subsequently was started on tube feeds.  Labs such as Mg, K, and phos were monitored for refeeding syndrome and repleted as needed.  He was continued on twice daily PPI throughout admission.  He received hydrocodone-acetaminophen through his tube for pain control.  He continued to have pain and redness around his J-tube site, and wound care nurse was consulted who recommended cleaning with soap and water 4 times daily and apply triple paste and clean split dressing.   COVID-19 infection Mild infection not requiring supplemental oxygen.  Received 3 day course of remdesivir.  Precautions were discontinued after 10 days on 4/28.  HAP Patient was febrile during admission, CXR with possible signs of pneumonia though no dyspnea or leukocytosis.  Patient was treated with 5 days of antibiotics, initially on IV CTX and vancomycin, then transitioned to doxycycline and amoxicillin-clavulanate for MRSA coverage as his MRSA PCR was positive.  All other issues chronic and stable.  Issues for follow up:  Ensure patient follows up with GI  outpatient. Encourage smoking cessation. Surgery follow up with cardio-thoracic surgery for esophageal resection follow up.

## 2020-05-30 ENCOUNTER — Encounter (HOSPITAL_COMMUNITY): Payer: Self-pay | Admitting: Gastroenterology

## 2020-05-30 DIAGNOSIS — U071 COVID-19: Secondary | ICD-10-CM

## 2020-05-30 DIAGNOSIS — R634 Abnormal weight loss: Secondary | ICD-10-CM

## 2020-05-30 DIAGNOSIS — K222 Esophageal obstruction: Secondary | ICD-10-CM

## 2020-05-30 DIAGNOSIS — E43 Unspecified severe protein-calorie malnutrition: Secondary | ICD-10-CM

## 2020-05-30 DIAGNOSIS — Z681 Body mass index (BMI) 19 or less, adult: Secondary | ICD-10-CM

## 2020-05-30 LAB — CBC
HCT: 37.8 % — ABNORMAL LOW (ref 39.0–52.0)
Hemoglobin: 13.3 g/dL (ref 13.0–17.0)
MCH: 32.5 pg (ref 26.0–34.0)
MCHC: 35.2 g/dL (ref 30.0–36.0)
MCV: 92.4 fL (ref 80.0–100.0)
Platelets: 195 10*3/uL (ref 150–400)
RBC: 4.09 MIL/uL — ABNORMAL LOW (ref 4.22–5.81)
RDW: 14.8 % (ref 11.5–15.5)
WBC: 16.5 10*3/uL — ABNORMAL HIGH (ref 4.0–10.5)
nRBC: 0 % (ref 0.0–0.2)

## 2020-05-30 LAB — BASIC METABOLIC PANEL
Anion gap: 5 (ref 5–15)
BUN: <5 mg/dL — ABNORMAL LOW (ref 6–20)
CO2: 23 mmol/L (ref 22–32)
Calcium: 7.9 mg/dL — ABNORMAL LOW (ref 8.9–10.3)
Chloride: 102 mmol/L (ref 98–111)
Creatinine, Ser: 0.58 mg/dL — ABNORMAL LOW (ref 0.61–1.24)
GFR, Estimated: >60 mL/min (ref >60)
Glucose, Bld: 112 mg/dL — ABNORMAL HIGH (ref 70–99)
Potassium: 3.1 mmol/L — ABNORMAL LOW (ref 3.5–5.1)
Sodium: 130 mmol/L — ABNORMAL LOW (ref 135–145)

## 2020-05-30 LAB — MRSA PCR SCREENING: MRSA by PCR: POSITIVE — AB

## 2020-05-30 LAB — MAGNESIUM: Magnesium: 1.7 mg/dL (ref 1.7–2.4)

## 2020-05-30 LAB — PHOSPHORUS: Phosphorus: 2.2 mg/dL — ABNORMAL LOW (ref 2.5–4.6)

## 2020-05-30 MED ORDER — CHLORHEXIDINE GLUCONATE CLOTH 2 % EX PADS
6.0000 | MEDICATED_PAD | Freq: Every day | CUTANEOUS | Status: AC
  Administered 2020-05-30 – 2020-06-02 (×4): 6 via TOPICAL

## 2020-05-30 MED ORDER — BOOST / RESOURCE BREEZE PO LIQD CUSTOM
1.0000 | Freq: Three times a day (TID) | ORAL | Status: DC
  Administered 2020-05-30 (×2): 1 via ORAL

## 2020-05-30 MED ORDER — POTASSIUM CHLORIDE 10 MEQ/100ML IV SOLN
10.0000 meq | INTRAVENOUS | Status: AC
  Administered 2020-05-30 (×4): 10 meq via INTRAVENOUS
  Filled 2020-05-30 (×4): qty 100

## 2020-05-30 MED ORDER — POTASSIUM PHOSPHATES 15 MMOLE/5ML IV SOLN
15.0000 mmol | Freq: Once | INTRAVENOUS | Status: AC
  Administered 2020-05-30: 15 mmol via INTRAVENOUS
  Filled 2020-05-30: qty 5

## 2020-05-30 MED ORDER — MUPIROCIN 2 % EX OINT
1.0000 "application " | TOPICAL_OINTMENT | Freq: Two times a day (BID) | CUTANEOUS | Status: AC
  Administered 2020-05-30 – 2020-06-03 (×8): 1 via NASAL
  Filled 2020-05-30 (×2): qty 22

## 2020-05-30 MED ORDER — VANCOMYCIN HCL 1250 MG/250ML IV SOLN
1250.0000 mg | Freq: Once | INTRAVENOUS | Status: AC
  Administered 2020-05-30: 1250 mg via INTRAVENOUS
  Filled 2020-05-30: qty 250

## 2020-05-30 NOTE — Progress Notes (Signed)
Nutrition Follow-up  DOCUMENTATION CODES:   Severe malnutrition in context of chronic illness,Underweight  INTERVENTION:    Try Boost Breeze po TID, each supplement provides 250 kcal and 9 grams of protein  When feeding tube is placed, recommend Jevity 1.2 begin at 20 ml/h, increase by 10 ml every 8 hours to goal rate of 70 ml/h to provide 2016 kcal, 93 gm protein, 1361 ml free water daily.  NUTRITION DIAGNOSIS:   Severe Malnutrition related to chronic illness (esophageal stricture) as evidenced by severe muscle depletion,severe fat depletion.  Ongoing  GOAL:   Patient will meet greater than or equal to 90% of their needs   Progressing  MONITOR:   PO intake,Supplement acceptance  REASON FOR ASSESSMENT:   Consult Assessment of nutrition requirement/status  ASSESSMENT:   55 yo male admitted with dysphagia. PMH includes esophagitis s/p dilation, alcohol abuse, CAD, dysphagia, CVA, MI, HTN, HLD.   S/P EGD which showed significant esophageal stricture that is not amenable to dilation. Also found to have esophagitis with bleeding. Recommendation for G-tube placement, which would be difficult and maybe impossible d/t almost complete obstruction of his esophagus. Surgery is consulting for feeding tube options.   Diet downgraded to clear liquids on 4/23. He had not been able to tolerate the Ensure supplements, so they were discontinued. Intake of clear liquids has been minimal.   Labs reviewed. Na 130, K 3.1, phos 2.2  Low K and phos likely related to refeeding syndrome, although patient is taking in minimal nutrition by mouth. Patient is receiving mag sulfate, KCl, K-Phos for repletion.  Medications reviewed and include IV mag sulfate, IV KCl, IV K-Phos. IVF: D5 NS at 125 ml/h  Diet Order:   Diet Order            Diet clear liquid Room service appropriate? Yes; Fluid consistency: Thin  Diet effective now                 EDUCATION NEEDS:   Education needs have  been addressed  Skin:  Skin Assessment: Reviewed RN Assessment  Last BM:  no BM documented  Height:   Ht Readings from Last 1 Encounters:  05/24/20 5\' 9"  (1.753 m)    Weight:   Wt Readings from Last 1 Encounters:  05/24/20 54.9 kg    BMI:  Body mass index is 17.87 kg/m.  Estimated Nutritional Needs:   Kcal:  1900-2100  Protein:  85-100 gm  Fluid:  >/= 1.8 L    05/26/20, RD, LDN, CNSC Please refer to Amion for contact information.

## 2020-05-30 NOTE — Consult Note (Signed)
[address]       [place] [address]             [phone]                    Ralph [name] [facility] Medical Record #[id number] Date of Birth: 1965/12/07  Referring: No ref. provider found Primary Care: Moses Manners, MD Primary Cardiologist: No primary care provider on file.  Chief Complaint:    Chief Complaint  Patient presents with  . Cough    COVID+    History of Present Illness:    Ralph [name] 55 y.o. male with a known history of esophageal stricture presents with a 2-week history of worsening dysphagia and weight loss.  He has a history of hiatal hernia as well as gastroesophageal reflux disease and is undergone multiple EGDs with dilations due to esophageal stricture.  He has had multiple biopsies which have been negative for malignancy as well.  On 05/27/2020 he underwent a repeat endoscopy which noted very tight stricture at 37 cm from the incisors as well as retained food.  Swallow studies also demonstrated that not much is passing through the stricture.  He is currently tolerating a clear liquid diet but occasionally has some difficulty managing his secretions.  CTS has been consulted to assist with management.    Past Medical History:  Diagnosis Date  . Coronary artery disease   . Myocardial infarction (HCC)   . Stroke [facility])     Past Surgical History:  Procedure Laterality Date  . APPENDECTOMY    . BALLOON DILATION N/A 06/01/2019   Procedure: BALLOON DILATION;  Surgeon: Vida Rigger, MD;  Location: [facility] ENDOSCOPY;  Service: Endoscopy;  Laterality: N/A;  . BALLOON DILATION N/A 07/16/2019   Procedure: BALLOON DILATION;  Surgeon: Vida Rigger, MD;  Location: [facility] ENDOSCOPY;  Service: Endoscopy;  Laterality: N/A;  . BALLOON DILATION N/A 09/09/2019   Procedure: BALLOON DILATION;  Surgeon: Vida Rigger, MD;  Location: [facility] ENDOSCOPY;  Service: Endoscopy;  Laterality: N/A;  . BIOPSY  04/29/2019   Procedure: BIOPSY;  Surgeon: Charlott Rakes, MD;   Location: [facility] ENDOSCOPY;  Service: Endoscopy;;  . BIOPSY  05/27/2019   Procedure: BIOPSY;  Surgeon: [name], MD;  Location: [facility] ENDOSCOPY;  Service: Endoscopy;;  . BIOPSY  06/01/2019   Procedure: BIOPSY;  Surgeon: Vida Rigger, MD;  Location: [facility] ENDOSCOPY;  Service: Endoscopy;;  . BIOPSY  07/16/2019   Procedure: BIOPSY;  Surgeon: Vida Rigger, MD;  Location: [facility] ENDOSCOPY;  Service: Endoscopy;;  . BIOPSY  05/27/2020   Procedure: BIOPSY;  Surgeon: Napoleon Form, MD;  Location: [facility] ENDOSCOPY;  Service: Endoscopy;;  . ESOPHAGOGASTRODUODENOSCOPY N/A 06/01/2019   Procedure: ESOPHAGOGASTRODUODENOSCOPY (EGD);  Surgeon: Vida Rigger, MD;  Location: [facility] ENDOSCOPY;  Service: Endoscopy;  Laterality: N/A;  . ESOPHAGOGASTRODUODENOSCOPY (EGD) WITH PROPOFOL N/A 04/29/2019   Procedure: ESOPHAGOGASTRODUODENOSCOPY (EGD) WITH PROPOFOL;  Surgeon: Charlott Rakes, MD;  Location: [facility] ENDOSCOPY;  Service: Endoscopy;  Laterality: N/A;  . ESOPHAGOGASTRODUODENOSCOPY (EGD) WITH PROPOFOL N/A 05/27/2019   Procedure: ESOPHAGOGASTRODUODENOSCOPY (EGD) WITH PROPOFOL;  Surgeon: [name], MD;  Location: [facility] ENDOSCOPY;  Service: Endoscopy;  Laterality: N/A;  . ESOPHAGOGASTRODUODENOSCOPY (EGD) WITH PROPOFOL N/A 07/16/2019   Procedure: ESOPHAGOGASTRODUODENOSCOPY (EGD) WITH PROPOFOL;  Surgeon: Vida Rigger, MD;  Location: [facility] ENDOSCOPY;  Service: Endoscopy;  Laterality: N/A;  . ESOPHAGOGASTRODUODENOSCOPY (EGD) WITH PROPOFOL N/A 09/09/2019   Procedure: ESOPHAGOGASTRODUODENOSCOPY (EGD) WITH PROPOFOL;  Surgeon: Vida Rigger, MD;  Location: [facility]  ENDOSCOPY;  Service: Endoscopy;  Laterality: N/A;  . ESOPHAGOGASTRODUODENOSCOPY (EGD) WITH PROPOFOL N/A 05/27/2020   Procedure: ESOPHAGOGASTRODUODENOSCOPY (EGD) WITH PROPOFOL;  Surgeon: Napoleon Form, MD;  Location: [facility] ENDOSCOPY;  Service: Endoscopy;  Laterality: N/A;  . FINE NEEDLE ASPIRATION  09/09/2019   Procedure: FINE NEEDLE ASPIRATION (FNA) LINEAR;  Surgeon: Vida Rigger, MD;  Location: [facility]  ENDOSCOPY;  Service: Endoscopy;;  . FOREIGN BODY REMOVAL  07/16/2019   Procedure: FOREIGN BODY REMOVAL;  Surgeon: Vida Rigger, MD;  Location: [facility] ENDOSCOPY;  Service: Endoscopy;;  . FOREIGN BODY REMOVAL  05/27/2020   Procedure: FOREIGN BODY REMOVAL;  Surgeon: Napoleon Form, MD;  Location: [facility] ENDOSCOPY;  Service: Endoscopy;;  . SHOULDER SURGERY    . UPPER ESOPHAGEAL ENDOSCOPIC ULTRASOUND (EUS) N/A 09/09/2019   Procedure: UPPER ESOPHAGEAL ENDOSCOPIC ULTRASOUND (EUS);  Surgeon: [name], MD;  Location: [facility] ENDOSCOPY;  Service: Endoscopy;  Laterality: N/A;    No family history on file.   Social History   Tobacco Use  Smoking Status Current Every Day Smoker  . Packs/day: 1.50  . Years: 20.00  . Pack years: 30.00  . Types: Cigarettes  Smokeless Tobacco Never Used    Social History   Substance and Sexual Activity  Alcohol Use Yes  . Alcohol/week: 1.0 standard drink  . Types: 1 Standard drinks or equivalent per week     No Known Allergies  Current Facility-Administered Medications  Medication Dose Route Frequency Provider Last Rate Last Admin  . acetaminophen (TYLENOL) tablet 650 mg  650 mg Oral Q6H PRN Littie Deeds, MD   650 mg at 05/29/20 2033   Or  . acetaminophen (TYLENOL) suppository 650 mg  650 mg Rectal Q6H PRN Littie Deeds, MD      . Ampicillin-Sulbactam (UNASYN) 3 g in sodium chloride 0.9 % 100 mL IVPB  3 g Intravenous Q8H Ganta, Anupa, DO 200 mL/hr at 05/30/20 0930 3 g at 05/30/20 0930  . Chlorhexidine Gluconate Cloth 2 % PADS 6 each  6 each Topical Q0600 Nestor Ramp, MD   6 each at 05/30/20 [phone]  . dextrose 5 %-0.9 % sodium chloride infusion   Intravenous Continuous Littie Deeds, MD 125 mL/hr at 05/30/20 1138 New Bag at 05/30/20 1138  . enoxaparin (LOVENOX) injection 40 mg  40 mg Subcutaneous QHS Littie Deeds, MD   40 mg at 05/29/20 2115  . feeding supplement (BOOST / RESOURCE BREEZE) liquid 1 Container  1 Container Oral TID BM Nestor Ramp, MD   1 Container at  05/30/20 1537  . magic mouthwash w/lidocaine  5 mL Oral QID Meredith Pel, NP   5 mL at 05/30/20 1333  . melatonin tablet 3 mg  3 mg Oral QHS PRN Littie Deeds, MD      . morphine 2 MG/ML injection 2 mg  2 mg Intravenous Q3H PRN Jackelyn Poling, DO   2 mg at 05/30/20 1537  . multivitamin with minerals tablet 1 tablet  1 tablet Oral Daily Doreene Eland, MD   1 tablet at 05/26/20 0847  . mupirocin ointment (BACTROBAN) 2 % 1 application  1 application Nasal BID Nestor Ramp, MD   1 application at 05/30/20 0926  . nicotine (NICODERM CQ - dosed in mg/24 hours) patch 14 mg  14 mg Transdermal Daily Littie Deeds, MD   14 mg at 05/30/20 4174  . pantoprazole (PROTONIX) injection 40 mg  40 mg Intravenous Daily Ganta, Anupa, DO   40 mg at 05/30/20 0924  . pneumococcal 23  valent vaccine (PNEUMOVAX-23) injection 0.5 mL  0.5 mL Intramuscular Tomorrow-1000 Terisa Starr M, MD      . potassium PHOSPHATE 15 mmol in dextrose 5 % 250 mL infusion  15 mmol Intravenous Once Littie Deeds, MD 43 mL/hr at 05/30/20 1239 15 mmol at 05/30/20 1239  . sucralfate (CARAFATE) 1 GM/10ML suspension 1 g  1 g Oral TID WC & HS Meredith Pel, NP   1 g at 05/30/20 1234    Review of Systems  Constitutional: Positive for malaise/fatigue and weight loss.  HENT: Positive for sore throat.   Respiratory: Negative.   Cardiovascular: Negative.     PHYSICAL EXAMINATION: BP 130/90 (BP Location: Right Arm)   Pulse 60   Temp 98.5 F (36.9 C) (Oral)   Resp 16   Ht  (1.753 m)   Wt 54.9 kg   SpO2 99%   BMI 17.87 kg/m   Physical Exam Constitutional:      General: He is not in acute distress.    Appearance: He is ill-appearing. He is not toxic-appearing.  HENT:     Head: Normocephalic and atraumatic.  Eyes:     Extraocular Movements: Extraocular movements intact.  Cardiovascular:     Rate and Rhythm: Normal rate.  Pulmonary:     Effort: Pulmonary effort is normal. No respiratory distress.  Abdominal:     General:  Abdomen is flat.     Palpations: Abdomen is soft.  Musculoskeletal:        General: Normal range of motion.  Neurological:     General: No focal deficit present.     Mental Status: He is alert and oriented to person, place, and time.      Diagnostic Studies & Laboratory data:     Recent Radiology Findings:   CT CHEST W CONTRAST  Result Date: 05/24/2020 CLINICAL DATA:  Chest and flank pain.  Worsening dysphagia. EXAM: CT CHEST, ABDOMEN, AND PELVIS WITH CONTRAST TECHNIQUE: Multidetector CT imaging of the chest, abdomen and pelvis was performed following the standard protocol during bolus administration of intravenous contrast. CONTRAST:  OMNIPAQUE IOHEXOL 300 MG/ML  SOLN COMPARISON:  None. FINDINGS: CT CHEST FINDINGS Cardiovascular: Heart size is normal. Normal aortic branching pattern. No pericardial effusion. Mediastinum/Nodes: Patulous esophagus is debris-filled with an area of focal narrowing at the level of the right pulmonary veins. (Coronal image 81). No mediastinal lymphadenopathy. Normal thyroid. No axillary adenopathy. Lungs/Pleura: Emphysema. No pleural effusion. No nodules or masses. Musculoskeletal: No chest wall mass or suspicious bone lesions identified. CT ABDOMEN PELVIS FINDINGS Hepatobiliary: No focal liver abnormality is seen. No gallstones, gallbladder wall thickening, or biliary dilatation. Pancreas: Unremarkable. No pancreatic ductal dilatation or surrounding inflammatory changes. Spleen: Normal in size without focal abnormality. Adrenals/Urinary Tract: Adrenal glands are unremarkable. Kidneys are normal, without renal calculi, focal lesion, or hydronephrosis. Bladder is unremarkable. Stomach/Bowel: Stomach is within normal limits. No evidence of bowel wall thickening, distention, or inflammatory changes. Vascular/Lymphatic: Aortic atherosclerosis. No enlarged abdominal or pelvic lymph nodes. Reproductive: Prostate is unremarkable. Other: No abdominal wall hernia or  abnormality. No abdominopelvic ascites. Musculoskeletal: No acute or significant osseous findings. IMPRESSION: 1. No acute abnormality of the chest, abdomen or pelvis. 2. Patulous, debris-filled esophagus with focal stenosis at the level of the right pulmonary veins. Aortic Atherosclerosis (ICD10-I70.0) and Emphysema (ICD10-J43.9). Electronically Signed   By: Deatra Robinson M.D.   On: 05/24/2020 01:48   CT ABDOMEN PELVIS W CONTRAST  Result Date: 05/24/2020 CLINICAL DATA:  Chest and flank pain.  Worsening dysphagia. EXAM: CT CHEST, ABDOMEN, AND PELVIS WITH CONTRAST TECHNIQUE: Multidetector CT imaging of the chest, abdomen and pelvis was performed following the standard protocol during bolus administration of intravenous contrast. CONTRAST:  OMNIPAQUE IOHEXOL 300 MG/ML  SOLN COMPARISON:  None. FINDINGS: CT CHEST FINDINGS Cardiovascular: Heart size is normal. Normal aortic branching pattern. No pericardial effusion. Mediastinum/Nodes: Patulous esophagus is debris-filled with an area of focal narrowing at the level of the right pulmonary veins. (Coronal image 81). No mediastinal lymphadenopathy. Normal thyroid. No axillary adenopathy. Lungs/Pleura: Emphysema. No pleural effusion. No nodules or masses. Musculoskeletal: No chest wall mass or suspicious bone lesions identified. CT ABDOMEN PELVIS FINDINGS Hepatobiliary: No focal liver abnormality is seen. No gallstones, gallbladder wall thickening, or biliary dilatation. Pancreas: Unremarkable. No pancreatic ductal dilatation or surrounding inflammatory changes. Spleen: Normal in size without focal abnormality. Adrenals/Urinary Tract: Adrenal glands are unremarkable. Kidneys are normal, without renal calculi, focal lesion, or hydronephrosis. Bladder is unremarkable. Stomach/Bowel: Stomach is within normal limits. No evidence of bowel wall thickening, distention, or inflammatory changes. Vascular/Lymphatic: Aortic atherosclerosis. No enlarged abdominal or pelvic  lymph nodes. Reproductive: Prostate is unremarkable. Other: No abdominal wall hernia or abnormality. No abdominopelvic ascites. Musculoskeletal: No acute or significant osseous findings. IMPRESSION: 1. No acute abnormality of the chest, abdomen or pelvis. 2. Patulous, debris-filled esophagus with focal stenosis at the level of the right pulmonary veins. Aortic Atherosclerosis (ICD10-I70.0) and Emphysema (ICD10-J43.9). Electronically Signed   By: Deatra Robinson M.D.   On: 05/24/2020 01:48   DG CHEST PORT 1 VIEW  Result Date: 05/29/2020 CLINICAL DATA:  Fever. Asymptomatic COVID last week. Fever and leukocytosis today. EXAM: PORTABLE CHEST 1 VIEW COMPARISON:  Radiograph 05/23/2020.  CT 05/24/2020, 5 days ago. FINDINGS: New volume loss in the left hemithorax. Patchy airspace disease involving the left lower lung zone is new from prior. Chronic hyperinflation and bronchial thickening. The heart is normal in size. No pleural effusion or pneumothorax. Stable osseous structures. IMPRESSION: 1. Volume loss in the left hemithorax with patchy airspace disease involving the left lower lung zone is new from prior and suspicious for pneumonia. Given esophageal dilatation on prior CT, aspiration is considered. 2. Underlying emphysema with hyperinflation. Electronically Signed   By: Narda Rutherford M.D.   On: 05/29/2020 22:56   DG Chest Port 1 View  Result Date: 05/23/2020 CLINICAL DATA:  Shortness of breath, pain. EXAM: PORTABLE CHEST 1 VIEW COMPARISON:  07/10/2019 FINDINGS: Heart and mediastinal contours are within normal limits. No focal opacities or effusions. No acute bony abnormality. IMPRESSION: No active cardiopulmonary disease. Electronically Signed   By: Charlett Nose M.D.   On: 05/23/2020 21:39   DG ESOPHAGUS W SINGLE CM (SOL OR THIN BA)  Result Date: 05/26/2020 CLINICAL DATA:  Unable to swallow EXAM: ESOPHOGRAM/BARIUM SWALLOW TECHNIQUE: Single contrast examination was performed using  thin barium. FLUOROSCOPY  TIME:  Fluoroscopy Time:  30 seconds Radiation Exposure Index (if provided by the fluoroscopic device): 1.5 mGy COMPARISON:  None. FINDINGS: Patient swallowed contrast via stroke in a semi supine position. Only a small volume of contrast was tolerated. There is distension of the mid esophagus with intraluminal debris. During the course of study, there is no passage contrast into the distal esophagus. No aspiration. IMPRESSION: Technically limited evaluation. Distended, debris-filled mid esophagus. No passage of contrast into the distal esophagus during course of study. Electronically Signed   By: Guadlupe Spanish M.D.   On: 05/26/2020 14:40       I have independently reviewed the  above radiology studies  and reviewed the findings with the patient.   Recent Lab Findings: Lab Results  Component Value Date   WBC 16.5 (H) 05/30/2020   HGB 13.3 05/30/2020   HCT 37.8 (L) 05/30/2020   PLT 195 05/30/2020   GLUCOSE 112 (H) 05/30/2020   CHOL 194 11/25/2012   TRIG 52 06/01/2019   HDL 50 11/25/2012   LDLDIRECT 67 10/23/2017   LDLCALC 125 (H) 11/25/2012   ALT 10 05/26/2020   AST 15 05/26/2020   NA 130 (L) 05/30/2020   K 3.1 (L) 05/30/2020   CL 102 05/30/2020   CREATININE 0.58 (L) 05/30/2020   BUN <5 (L) 05/30/2020   CO2 23 05/30/2020   TSH 1.152 05/01/2019   INR 0.9 04/29/2019   HGBA1C 4.9 04/28/2019        Assessment / Plan:   This is a 55 year old gentleman with a critical distal esophageal stricture that is resulted in severe weight loss.  Biopsies have all been negative for malignancy.  He has undergone previous dilations but currently the stricture is too tight for any further manipulation.  Additionally on cross-sectional imaging his esophagus is quite dilated.  His most recent albumin was 3.0 however he has lost over 50 pounds.  The priority is to obtain some type of feeding access.  We have discussed several options, and tomorrow I will take him to for another endoscopy with attempted  passage of a feeding tube through the stricture.  If this is unsuccessful then we will perform a laparoscopic jejunostomy.  In regards to the stricture I do not think that this can be dilated anymore, and given the degree of dilation of his proximal esophagus esophagectomy will be his only potential option.  Given his severe malnutrition I do not think that he would do well with an esophagectomy thus establishing feeding access first will be the main goal.  Once his nutritional status is improved on tube feeds, we will plan for an esophageal resection.  Leodis LiverpoolHarrell O Dailah Opperman O Justus Droke 05/30/2020 5:29 PM

## 2020-05-30 NOTE — Progress Notes (Addendum)
Pharmacy Antibiotic Note  Ralph [name] is a 55 y.o. male admitted on 05/23/2020 with long-standing dysphagia, found to be COVID+. Patient asymptomatic, received 3-day remdesivir regimen, completed 4/22. Patient found to have fever of 100.5 overnight 4/24 with elevated WBC of 14. CXR 4/24 showed patchy airspace in L lung, suspicious for pneumonia. Pharmacy has been consulted for vancomycin dosing.   WBC trended up slightly to 16.5 today, patient now afebrile. Unasyn started 4/24, coverage broadened to vancomycin due to positive MRSA PCR and WBC uptrend.    Plan: Administer vancomycin 1250 mg IV once as loading dose   Initiate vancomycin 750 mg IV every 24 hours (eAUC: 510, rounded Scr to 0.8)  Monitor signs/symptoms of infection (e.g., temp, WBC) and culture results for potential de-escalation  Height: 5\' 9"  (175.3 cm) Weight: 54.9 kg (121 lb 0.5 oz) IBW/kg (Calculated) : 70.7  Temp (24hrs), Avg:100.6 F (38.1 C), Min:98.5 F (36.9 C), Max:102.7 F (39.3 C)  Recent Labs  Lab 05/26/20 0052 05/26/20 2059 05/27/20 0425 05/28/20 0105 05/29/20 0107 05/30/20 0134  WBC 9.7  --  6.4 5.8 14.0* 16.5*  CREATININE 0.66 0.60* 0.57* 0.53* 0.62 0.58*    Estimated Creatinine Clearance: 83.9 mL/min (A) (by C-G formula based on SCr of 0.58 mg/dL (L)).    No Known Allergies  Antimicrobials this admission: Unasyn 4/25 >>   Vanc 4/25 >>    Dose adjustments this admission: None  Microbiology results: 4/24 Bcx: NGTD 4/25 MRSA PCR: positive    Thank you for allowing pharmacy to be a part of this patient's care.  5/25  PharmD Candidate, Class of 2022 05/30/2020 2:47 PM

## 2020-05-30 NOTE — Progress Notes (Signed)
Family Medicine Teaching Service Daily Progress Note Intern Pager: [phone]  Patient name: Ralph [name] Medical record number: [id number] Date of birth: 10-12-1965 Age: 55 y.o. Gender: male  Primary Care Provider: Moses Manners, MD Consultants: GI, cardiothoracic surgery, general surgery   Code Status: Full Code  Pt Overview and Major Events to Date:  4/18 admitted 4/22 EGD with findings of significant esophageal stricture not amenable to dilation  Assessment and Plan: Ralph [name] is a 55 y.o. male presenting with dysphagia and weight loss secondary to severe esophageal stricture. PMH is significant forgastritis and esophageal stricture, CVA, tobacco use, HTN, HLD.  Esophageal stricture Continues to have dysphagia even with liquids.  Cardiothoracic surgery consulted for further management.  Will need parenteral nutrition such as G-tube, G-tube, or TPN pending cardiothoracic surgery evaluation.  We will likely proceed with TPN if no procedure planned soon. - GI following, appreciate recommendations - Cardiothoracic surgery consulted, awaiting recommendations - general surgery consulted for possible G-tube or J-tube placement - f/u EGD biopsy results - mIVF D5NS 125 ml/hr - clear liquid diet, advance as tolerated - IV pantoprazole 40 mg daily - monitor Mg and P for refeeding syndrome - set up GI follow-up prior to discharge  Pneumonia Yesterday, patient spiked a fever to 102.7 F, which improved to 100.5 F after receiving a dose of acetaminophen.  Blood cultures, urine culture, and CXR were obtained at that time.  CXR with evidence of possible pneumonia in the left lower lobe.  Leukocytosis of WBC 14.0 noted yesterday morning.  He was empirically started on ampicillin-sulbactam given possible aspiration pneumonia given ongoing dysphagia.  MRSA PCR swab positive overnight.  Leukocytosis worsening, WBC 16.5 this morning.  Though patient is afebrile this morning without focal lung  findings or supplemental oxygen requirement, given worsening leukocytosis and positive MRSA PCR, will add on vancomycin for MRSA coverage.  - IV ampicillin-sulbactam day 2 (4/24-) - start IV vancomycin (4/25-) per pharmacy - f/u blood cultures, urine cultures  COVID-19 Mild infection, no supplemental oxygen requirement throughout admission.  Possibly contributing to fever. - s/p remdesivir x 3 days  Hypokalemia K 3.1 this morning.  Magnesium within normal limits. - IV KCl 10 mEq x 4 - monitor BMP  Hypophosphatemia Phos 2.2 this morning.  - IV KPhos - monitor phos  Chest pain Likely secondary to esophageal etiology. - IV morphine 2 mg q2h  Protein calorie malnutrition With cachexia and weight loss in the setting of poor oral intake secondary to esophageal stricture.  - RD consulted - feeding supplement TID between meals as able  HTN Normotensive without any antihypertensives.  HLD Onatorvastatin, though appears patient is not taking this. -encourage statin therapy outpatient  CVA Patient has history of stroke in 2018 with residual left-sided weakness. Home medications include atorvastatinand ASA, though appears patient is not taking these. -encouraged that patient be on aspirin given CVA history, recommend discussing with PCP outpatient to start on aspirin and statin  Tobacco use disorder Patient reports smoking 1PPD since age of 68, recently cut back to 1/2PPD.  - nicotine patch - consider restarting bupropion   Alcoholuse He reports that he drinks 1-2 40 oz beers per night. Last drink 4/18.Denies history of alcohol withdrawal. - thiamine  MDD Prescribed bupropion but is not taking at home. -consider restarting bupropion outpatient  FEN/GI: Clear liquids, mIVF D5 NS 125 mL/hr PPx: LMWH  Disposition: med-surg, likely home when medically stable  Subjective:  Yesterday, patient spiked a fever to 102.7 F.  He received a dose of acetaminophen with  improvement to 100.5 F. Patient states that he was using his phone for prolonged period of time which he believes was the cause of his fever.  He feels well otherwise.  He denies any shortness of breath currently.  He is still having dysphagia, drinking minimal liquids.  He is still having chest pain, not significantly changed from previous but he states that the morphine will only last about 2 hours before he starts having pain again.  Objective: Temp:  [98.8 F (37.1 C)-102.7 F (39.3 C)] 100.5 F (38.1 C) (04/24 2117) Pulse Rate:  [65-83] 78 (04/24 2117) Resp:  [16-20] 20 (04/24 2117) BP: (96-136)/(73-90) 135/89 (04/24 2117) SpO2:  [94 %-98 %] 98 % (04/24 2117) Physical Exam: General: Cachectic appearing middle-aged male, NAD Cardiovascular: RRR, no murmurs Respiratory: Clear to auscultation bilaterally, breathing comfortably on room air Abdomen: Soft, epigastric tenderness, hypoactive bowel sounds Extremities: WWP, no edema  Laboratory: Recent Labs  Lab 05/28/20 0105 05/29/20 0107 05/30/20 0134  WBC 5.8 14.0* 16.5*  HGB 13.5 14.0 13.3  HCT 40.3 42.4 37.8*  PLT 181 197 195   Recent Labs  Lab 05/23/20 2040 05/24/20 0502 05/26/20 2059 05/27/20 0425 05/28/20 0105 05/29/20 0107 05/30/20 0134  NA 133*   < > 135   < > 136 132* 130*  K 3.9   < > 3.7   < > 3.3* 4.2 3.1*  CL 93*   < > 103   < > 109 102 102  CO2 28   < > 25   < > 22 21* 23  BUN 10   < > <5*   < > <5* <5* <5*  CREATININE 0.76   < > 0.60*   < > 0.53* 0.62 0.58*  CALCIUM 9.8   < > 8.8*   < > 8.4* 8.5* 7.9*  PROT 7.2  --  5.7*  --   --   --   --   BILITOT 1.2  --  0.8  --   --   --   --   ALKPHOS 88  --  61  --   --   --   --   ALT 12  --  10  --   --   --   --   AST 20  --  15  --   --   --   --   GLUCOSE 79   < > 106*   < > 127* 81 112*   < > = values in this interval not displayed.     Imaging/Diagnostic Tests: DG CHEST PORT 1 VIEW  Result Date: 05/29/2020 CLINICAL DATA:  Fever. Asymptomatic COVID  last week. Fever and leukocytosis today. EXAM: PORTABLE CHEST 1 VIEW COMPARISON:  Radiograph 05/23/2020.  CT 05/24/2020, 5 days ago. FINDINGS: New volume loss in the left hemithorax. Patchy airspace disease involving the left lower lung zone is new from prior. Chronic hyperinflation and bronchial thickening. The heart is normal in size. No pleural effusion or pneumothorax. Stable osseous structures. IMPRESSION: 1. Volume loss in the left hemithorax with patchy airspace disease involving the left lower lung zone is new from prior and suspicious for pneumonia. Given esophageal dilatation on prior CT, aspiration is considered. 2. Underlying emphysema with hyperinflation. Electronically Signed   By: Narda Rutherford M.D.   On: 05/29/2020 22:56     Littie Deeds, MD 05/30/2020, 7:34 AM PGY-1, [facility] Family Medicine FPTS Intern pager: [phone], text pages welcome

## 2020-05-31 ENCOUNTER — Encounter (HOSPITAL_COMMUNITY)
Admission: EM | Disposition: A | Discharge status: Home / Self Care | Payer: Self-pay | Source: Home / Self Care | Attending: Family Medicine

## 2020-05-31 ENCOUNTER — Inpatient Hospital Stay (HOSPITAL_COMMUNITY): Payer: Medicaid Other | Admitting: Certified Registered Nurse Anesthetist

## 2020-05-31 HISTORY — PX: LAPAROSCOPIC JEJUNOSTOMY: SHX6880

## 2020-05-31 HISTORY — PX: ESOPHAGOGASTRODUODENOSCOPY: SHX5428

## 2020-05-31 LAB — CBC
HCT: 36 % — ABNORMAL LOW (ref 39.0–52.0)
Hemoglobin: 12.7 g/dL — ABNORMAL LOW (ref 13.0–17.0)
MCH: 32.7 pg (ref 26.0–34.0)
MCHC: 35.3 g/dL (ref 30.0–36.0)
MCV: 92.8 fL (ref 80.0–100.0)
Platelets: 182 10*3/uL (ref 150–400)
RBC: 3.88 MIL/uL — ABNORMAL LOW (ref 4.22–5.81)
RDW: 14.8 % (ref 11.5–15.5)
WBC: 12.7 10*3/uL — ABNORMAL HIGH (ref 4.0–10.5)
nRBC: 0 % (ref 0.0–0.2)

## 2020-05-31 LAB — BASIC METABOLIC PANEL
Anion gap: 7 (ref 5–15)
BUN: <5 mg/dL — ABNORMAL LOW (ref 6–20)
CO2: 22 mmol/L (ref 22–32)
Calcium: 8 mg/dL — ABNORMAL LOW (ref 8.9–10.3)
Chloride: 100 mmol/L (ref 98–111)
Creatinine, Ser: 0.56 mg/dL — ABNORMAL LOW (ref 0.61–1.24)
GFR, Estimated: >60 mL/min (ref >60)
Glucose, Bld: 101 mg/dL — ABNORMAL HIGH (ref 70–99)
Potassium: 3.5 mmol/L (ref 3.5–5.1)
Sodium: 129 mmol/L — ABNORMAL LOW (ref 135–145)

## 2020-05-31 LAB — URINE CULTURE: Culture: NO GROWTH

## 2020-05-31 LAB — GLUCOSE, CAPILLARY: Glucose-Capillary: 139 mg/dL — ABNORMAL HIGH (ref 70–99)

## 2020-05-31 LAB — PHOSPHORUS: Phosphorus: 2 mg/dL — ABNORMAL LOW (ref 2.5–4.6)

## 2020-05-31 LAB — MAGNESIUM: Magnesium: 1.7 mg/dL (ref 1.7–2.4)

## 2020-05-31 SURGERY — CREATION, JEJUNOSTOMY, LAPAROSCOPIC
Anesthesia: General

## 2020-05-31 MED ORDER — FENTANYL CITRATE (PF) 250 MCG/5ML IJ SOLN
INTRAMUSCULAR | Status: AC
  Filled 2020-05-31: qty 5

## 2020-05-31 MED ORDER — ONDANSETRON HCL 4 MG/2ML IJ SOLN
INTRAMUSCULAR | Status: DC | PRN
  Administered 2020-05-31: 4 mg via INTRAVENOUS

## 2020-05-31 MED ORDER — FENTANYL CITRATE (PF) 100 MCG/2ML IJ SOLN
25.0000 ug | INTRAMUSCULAR | Status: DC | PRN
  Administered 2020-05-31: 25 ug via INTRAVENOUS

## 2020-05-31 MED ORDER — LACTATED RINGERS IV SOLN: INTRAVENOUS | Status: DC | PRN

## 2020-05-31 MED ORDER — ACETAMINOPHEN 325 MG PO TABS: 650.0000 mg | ORAL_TABLET | Freq: Four times a day (QID) | ORAL | Status: DC | PRN

## 2020-05-31 MED ORDER — SUGAMMADEX SODIUM 200 MG/2ML IV SOLN
INTRAVENOUS | Status: DC | PRN
  Administered 2020-05-31: 110 mg via INTRAVENOUS

## 2020-05-31 MED ORDER — JEVITY 1.2 CAL PO LIQD
1000.0000 mL | ORAL | Status: DC
  Administered 2020-05-31 – 2020-06-14 (×16): 1000 mL
  Filled 2020-05-31 (×28): qty 1000

## 2020-05-31 MED ORDER — SODIUM CHLORIDE 0.9 % IV SOLN: INTRAVENOUS | Status: DC | PRN

## 2020-05-31 MED ORDER — PANTOPRAZOLE SODIUM 40 MG PO PACK
40.0000 mg | PACK | Freq: Two times a day (BID) | ORAL | Status: DC
  Administered 2020-05-31 – 2020-06-14 (×29): 40 mg
  Filled 2020-05-31 (×31): qty 20

## 2020-05-31 MED ORDER — MELATONIN 3 MG PO TABS
3.0000 mg | ORAL_TABLET | Freq: Every evening | ORAL | Status: DC | PRN
  Administered 2020-06-06 – 2020-06-12 (×7): 3 mg
  Filled 2020-05-31 (×7): qty 1

## 2020-05-31 MED ORDER — PROPOFOL 10 MG/ML IV BOLUS
INTRAVENOUS | Status: DC | PRN
  Administered 2020-05-31: 120 mg via INTRAVENOUS

## 2020-05-31 MED ORDER — LIDOCAINE 2% (20 MG/ML) 5 ML SYRINGE
INTRAMUSCULAR | Status: DC | PRN
  Administered 2020-05-31: 60 mg via INTRAVENOUS

## 2020-05-31 MED ORDER — SUCCINYLCHOLINE CHLORIDE 20 MG/ML IJ SOLN
INTRAMUSCULAR | Status: DC | PRN
  Administered 2020-05-31: 100 mg via INTRAVENOUS

## 2020-05-31 MED ORDER — EPINEPHRINE PF 1 MG/ML IJ SOLN
INTRAMUSCULAR | Status: AC
  Filled 2020-05-31: qty 1

## 2020-05-31 MED ORDER — OSMOLITE 1.2 CAL PO LIQD: 1000.0000 mL | ORAL | Status: DC

## 2020-05-31 MED ORDER — PHENYLEPHRINE 40 MCG/ML (10ML) SYRINGE FOR IV PUSH (FOR BLOOD PRESSURE SUPPORT)
PREFILLED_SYRINGE | INTRAVENOUS | Status: DC | PRN
  Administered 2020-05-31: 80 ug via INTRAVENOUS

## 2020-05-31 MED ORDER — LIDOCAINE HCL (PF) 1 % IJ SOLN
INTRAMUSCULAR | Status: AC
  Filled 2020-05-31: qty 30

## 2020-05-31 MED ORDER — ADULT MULTIVITAMIN W/MINERALS CH
1.0000 | ORAL_TABLET | Freq: Every day | ORAL | Status: DC
  Administered 2020-06-06: 1
  Filled 2020-05-31: qty 1

## 2020-05-31 MED ORDER — VANCOMYCIN HCL 750 MG/150ML IV SOLN
750.0000 mg | Freq: Two times a day (BID) | INTRAVENOUS | Status: DC
  Administered 2020-05-31 – 2020-06-01 (×2): 750 mg via INTRAVENOUS
  Filled 2020-05-31 (×4): qty 150

## 2020-05-31 MED ORDER — FENTANYL CITRATE (PF) 100 MCG/2ML IJ SOLN
INTRAMUSCULAR | Status: AC
  Filled 2020-05-31: qty 2

## 2020-05-31 MED ORDER — POTASSIUM PHOSPHATES 15 MMOLE/5ML IV SOLN
20.0000 mmol | Freq: Once | INTRAVENOUS | Status: DC
  Filled 2020-05-31: qty 6.67

## 2020-05-31 MED ORDER — POTASSIUM CHLORIDE 20 MEQ PO PACK
20.0000 meq | PACK | Freq: Once | ORAL | Status: AC
  Administered 2020-05-31: 20 meq
  Filled 2020-05-31: qty 1

## 2020-05-31 MED ORDER — MIDAZOLAM HCL 2 MG/2ML IJ SOLN
INTRAMUSCULAR | Status: AC
  Filled 2020-05-31: qty 2

## 2020-05-31 MED ORDER — SUCRALFATE 1 GM/10ML PO SUSP
1.0000 g | Freq: Three times a day (TID) | ORAL | Status: DC
  Administered 2020-05-31 – 2020-06-14 (×55): 1 g
  Filled 2020-05-31 (×57): qty 10

## 2020-05-31 MED ORDER — FENTANYL CITRATE (PF) 250 MCG/5ML IJ SOLN
INTRAMUSCULAR | Status: DC | PRN
  Administered 2020-05-31 (×4): 50 ug via INTRAVENOUS

## 2020-05-31 MED ORDER — DEXAMETHASONE SODIUM PHOSPHATE 10 MG/ML IJ SOLN
INTRAMUSCULAR | Status: DC | PRN
  Administered 2020-05-31: 5 mg via INTRAVENOUS

## 2020-05-31 MED ORDER — LIDOCAINE HCL (PF) 1 % IJ SOLN
INTRAMUSCULAR | Status: DC | PRN
  Administered 2020-05-31: 16 mL

## 2020-05-31 MED ORDER — ONDANSETRON HCL 4 MG/2ML IJ SOLN: 4.0000 mg | Freq: Four times a day (QID) | INTRAMUSCULAR | Status: DC | PRN

## 2020-05-31 MED ORDER — EPHEDRINE SULFATE 50 MG/ML IJ SOLN
INTRAMUSCULAR | Status: DC | PRN
  Administered 2020-05-31: 5 mg via INTRAVENOUS

## 2020-05-31 MED ORDER — MIDAZOLAM HCL 5 MG/5ML IJ SOLN
INTRAMUSCULAR | Status: DC | PRN
  Administered 2020-05-31: 2 mg via INTRAVENOUS

## 2020-05-31 MED ORDER — HYDROCODONE-ACETAMINOPHEN 7.5-325 MG/15ML PO SOLN
10.0000 mL | Freq: Four times a day (QID) | ORAL | Status: DC | PRN
  Administered 2020-05-31 – 2020-06-01 (×3): 10 mL
  Filled 2020-05-31 (×3): qty 15

## 2020-05-31 MED ORDER — VANCOMYCIN HCL 750 MG/150ML IV SOLN: 750.0000 mg | INTRAVENOUS | Status: DC

## 2020-05-31 MED ORDER — MORPHINE SULFATE (PF) 2 MG/ML IV SOLN
1.0000 mg | INTRAVENOUS | Status: DC | PRN
Start: 2020-05-31 — End: 2020-06-01
  Administered 2020-05-31 – 2020-06-01 (×8): 1 mg via INTRAVENOUS
  Filled 2020-05-31 (×8): qty 1

## 2020-05-31 MED ORDER — ACETAMINOPHEN 650 MG RE SUPP: 650.0000 mg | Freq: Four times a day (QID) | RECTAL | Status: DC | PRN

## 2020-05-31 MED ORDER — PROMETHAZINE HCL 25 MG/ML IJ SOLN: 6.2500 mg | INTRAMUSCULAR | Status: DC | PRN

## 2020-05-31 MED ORDER — PHENYLEPHRINE HCL-NACL 10-0.9 MG/250ML-% IV SOLN
INTRAVENOUS | Status: DC | PRN
  Administered 2020-05-31: 50 ug/min via INTRAVENOUS

## 2020-05-31 MED ORDER — ROCURONIUM BROMIDE 100 MG/10ML IV SOLN
INTRAVENOUS | Status: DC | PRN
  Administered 2020-05-31: 50 mg via INTRAVENOUS

## 2020-05-31 MED ORDER — DEXTROSE-NACL 5-0.9 % IV SOLN: INTRAVENOUS | Status: DC

## 2020-05-31 MED ORDER — 0.9 % SODIUM CHLORIDE (POUR BTL) OPTIME
TOPICAL | Status: DC | PRN
  Administered 2020-05-31: 2000 mL

## 2020-05-31 SURGICAL SUPPLY — 57 items
ADH SKN CLS APL DERMABOND .7 (GAUZE/BANDAGES/DRESSINGS) ×1
APL PRP STRL LF DISP 70% ISPRP (MISCELLANEOUS) ×1
BLADE SURG 11 STRL SS (BLADE) ×2 IMPLANT
BUTTON OLYMPUS DEFENDO 5 PIECE (MISCELLANEOUS) ×2 IMPLANT
CANISTER SUCT 3000ML PPV (MISCELLANEOUS) ×2 IMPLANT
CHLORAPREP W/TINT 26 (MISCELLANEOUS) ×2 IMPLANT
CNTNR URN SCR LID CUP LEK RST (MISCELLANEOUS) ×1 IMPLANT
CONT SPEC 4OZ STRL OR WHT (MISCELLANEOUS) ×2
COVER BACK TABLE 60X90IN (DRAPES) ×2 IMPLANT
COVER SURGICAL LIGHT HANDLE (MISCELLANEOUS) ×1 IMPLANT
DECANTER SPIKE VIAL GLASS SM (MISCELLANEOUS) ×2 IMPLANT
DEFOGGER ANTIFOG KIT (MISCELLANEOUS) ×1 IMPLANT
DERMABOND ADVANCED (GAUZE/BANDAGES/DRESSINGS) ×1
DERMABOND ADVANCED .7 DNX12 (GAUZE/BANDAGES/DRESSINGS) ×1 IMPLANT
DEVICE SUTURE ENDOST 10MM (ENDOMECHANICALS) ×1 IMPLANT
DRAPE LAPAROSCOPIC ABDOMINAL (DRAPES) ×2 IMPLANT
DRAPE WARM FLUID 44X44 (DRAPES) ×1 IMPLANT
FORCEPS GRASP COMBO 8X230 (FORCEP) ×1 IMPLANT
GAUZE SPONGE 4X4 12PLY STRL (GAUZE/BANDAGES/DRESSINGS) ×2 IMPLANT
GLOVE SURG MICRO LTX SZ6.5 (GLOVE) ×2 IMPLANT
GOWN STRL REUS W/ TWL XL LVL3 (GOWN DISPOSABLE) ×1 IMPLANT
GOWN STRL REUS W/TWL XL LVL3 (GOWN DISPOSABLE) ×2
GRASPER SUT TROCAR 14GX15 (MISCELLANEOUS) ×2 IMPLANT
KIT DILATOR VASC 18G NDL (KITS) ×1 IMPLANT
KIT TURNOVER KIT B (KITS) ×2 IMPLANT
MARKER SKIN DUAL TIP RULER LAB (MISCELLANEOUS) ×2 IMPLANT
NEEDLE 22X1 1/2 (OR ONLY) (NEEDLE) ×1 IMPLANT
NS IRRIG 1000ML POUR BTL (IV SOLUTION) ×2 IMPLANT
OIL SILICONE PENTAX (PARTS (SERVICE/REPAIRS)) ×1 IMPLANT
PAD ARMBOARD 7.5X6 YLW CONV (MISCELLANEOUS) ×4 IMPLANT
RELOAD ENDO STITCH (ENDOMECHANICALS) ×12 IMPLANT
RELOAD SUT TRIPLE-STITCH 2-0 (ENDOMECHANICALS) IMPLANT
SHEATH PEELAWAY 22FR (SHEATH) ×1 IMPLANT
SLEEVE ENDOPATH XCEL 5M (ENDOMECHANICALS) ×2 IMPLANT
SNARE SHORT THROW 27M MED OVAL (MISCELLANEOUS) ×1 IMPLANT
SPONGE LAP 18X18 RF (DISPOSABLE) ×1 IMPLANT
SUT MNCRL AB 4-0 PS2 18 (SUTURE) ×3 IMPLANT
SUT SILK  1 MH (SUTURE) ×4
SUT SILK 1 MH (SUTURE) ×1 IMPLANT
SUT VICRYL 0 UR6 27IN ABS (SUTURE) ×3 IMPLANT
SYR 20ML ECCENTRIC (SYRINGE) ×2 IMPLANT
SYR 30ML SLIP (SYRINGE) ×3 IMPLANT
SYR BULB IRRIG 60ML STRL (SYRINGE) ×1 IMPLANT
SYR CONTROL 10ML LL (SYRINGE) ×1 IMPLANT
TOWEL GREEN STERILE (TOWEL DISPOSABLE) ×2 IMPLANT
TOWEL GREEN STERILE FF (TOWEL DISPOSABLE) ×2 IMPLANT
TRAY FOLEY MTR SLVR 16FR STAT (SET/KITS/TRAYS/PACK) ×1 IMPLANT
TROCAR XCEL 12X100 BLDLESS (ENDOMECHANICALS) ×2 IMPLANT
TROCAR XCEL NON-BLD 5MMX100MML (ENDOMECHANICALS) ×2 IMPLANT
TUBE CONNECTING 20X1/4 (TUBING) ×2 IMPLANT
TUBE J 18FR (TUBING) ×1 IMPLANT
TUBE SALEM SUMP 12R W/ARV (TUBING) ×1 IMPLANT
TUBING ENDO SMARTCAP (MISCELLANEOUS) ×2 IMPLANT
TUBING LAP HI FLOW INSUFFLATIO (TUBING) ×2 IMPLANT
UNDERPAD 30X36 HEAVY ABSORB (UNDERPADS AND DIAPERS) ×2 IMPLANT
WATER STERILE IRR 1000ML POUR (IV SOLUTION) ×2 IMPLANT
WIRE EMERALD 3MM-J .035X150CM (WIRE) ×2 IMPLANT

## 2020-05-31 NOTE — Progress Notes (Signed)
Family Medicine Teaching Service Daily Progress Note Intern Pager: [phone]  Patient name: Ralph [name] Medical record number: [id number] Date of birth: 1965/04/07 Age: 55 y.o. Gender: male  Primary Care Provider: Moses Manners, MD Consultants: GI, cardiothoracic surgery, general surgery   Code Status: Full Code  Pt Overview and Major Events to Date:  4/18 admitted 4/22 EGD with findings of significant esophageal stricture not amenable to dilation  Assessment and Plan: Ralph [name] is a 55 y.o. male presenting with dysphagia and weight loss secondary to severe esophageal stricture. PMH is significant forgastritis and esophageal stricture, CVA, tobacco use, HTN, HLD.  Esophageal stricture Continues to have dysphagia even with liquids.  Plan for OR today with cardiothoracic surgery to attempt passage of feeding tube through stricture, laparoscopic jejunostomy if unsuccessful.  Ultimately plan for esophageal resection once nutritional status is improved. - GI following, appreciate recommendations - Cardiothoracic surgery consulted, awaiting recommendations - general surgery consulted for possible G-tube or J-tube placement - f/u EGD biopsy results - mIVF D5NS 125 ml/hr - NPO for procedure - IV pantoprazole 40 mg daily - monitor Mg and P for refeeding syndrome - set up GI follow-up prior to discharge  Pneumonia Patient afebrile, no dyspnea and breathing comfortably on room air. - IV ampicillin-sulbactam day 3 (4/24-) - IV vancomycin (4/25-) per pharmacy, day 2 - f/u blood cultures, urine cultures  COVID-19 Mild infection, no supplemental oxygen requirement throughout admission.  Possibly contributing to fever. - s/p remdesivir x 3 days  Hypokalemia K 3.5 this AM.  Magnesium within normal limits. - IV KCl 10 mEq x 2 to replenish stores - monitor BMP  Hypophosphatemia Phos 2.0 this morning.  - IV KPhos - monitor phos  Chest pain Likely secondary to esophageal  etiology. - IV morphine 2 mg q3h - APAP prn  Protein calorie malnutrition With cachexia and weight loss in the setting of poor oral intake secondary to esophageal stricture.  - RD consulted - feeding supplement TID between meals as able  HTN Normotensive without any antihypertensives.  HLD Onatorvastatin, though appears patient is not taking this. -encourage statin therapy outpatient  CVA Patient has history of stroke in 2018 with residual left-sided weakness. Home medications include atorvastatinand ASA, though appears patient is not taking these. -encouraged that patient be on aspirin given CVA history, recommend discussing with PCP outpatient to start on aspirin and statin  Tobacco use disorder Patient reports smoking 1PPD since age of 64, recently cut back to 1/2PPD.  - nicotine patch - consider restarting bupropion   Alcoholuse He reports that he drinks 1-2 40 oz beers per night. Last drink 4/18.Denies history of alcohol withdrawal. - thiamine  MDD Prescribed bupropion but is not taking at home. -consider restarting bupropion outpatient  FEN/GI: NPO for procedure, mIVF D5 NS 125 mL/hr PPx: LMWH  Disposition: med-surg, likely home when medically stable  Subjective:  NAOE.  Patient still having significant dysphagia and epigastric pain.  He reports only short-lived relief with the morphine.  Denies shortness of breath.  Objective: Temp:  [98.5 F (36.9 C)-99.7 F (37.6 C)] 99.7 F (37.6 C) (04/26 0543) Pulse Rate:  [54-63] 63 (04/26 0543) Resp:  [16-18] 18 (04/26 0543) BP: (103-130)/(69-90) 103/69 (04/26 0543) SpO2:  [97 %-100 %] 97 % (04/26 0543) Physical Exam: General: Cachectic appearing middle-aged male, NAD Cardiovascular: RRR, no murmurs Respiratory: Clear to auscultation bilaterally, breathing comfortably on room air Abdomen: Soft, epigastric tenderness, hypoactive bowel sounds Extremities: WWP, no edema  Laboratory: Recent Labs  Lab  05/29/20 0107 05/30/20 0134 05/31/20 0132  WBC 14.0* 16.5* 12.7*  HGB 14.0 13.3 12.7*  HCT 42.4 37.8* 36.0*  PLT 197 195 182   Recent Labs  Lab 05/26/20 2059 05/27/20 0425 05/29/20 0107 05/30/20 0134 05/31/20 0132  NA 135   < > 132* 130* 129*  K 3.7   < > 4.2 3.1* 3.5  CL 103   < > 102 102 100  CO2 25   < > 21* 23 22  BUN <5*   < > <5* <5* <5*  CREATININE 0.60*   < > 0.62 0.58* 0.56*  CALCIUM 8.8*   < > 8.5* 7.9* 8.0*  PROT 5.7*  --   --   --   --   BILITOT 0.8  --   --   --   --   ALKPHOS 61  --   --   --   --   ALT 10  --   --   --   --   AST 15  --   --   --   --   GLUCOSE 106*   < > 81 112* 101*   < > = values in this interval not displayed.     Imaging/Diagnostic Tests: No new imaging.   Littie Deeds, MD 05/31/2020, 7:42 AM PGY-1, [facility] Family Medicine FPTS Intern pager: [phone], text pages welcome

## 2020-05-31 NOTE — Transfer of Care (Signed)
Immediate Anesthesia Transfer of Care Note  Patient: Ralph [name]  Procedure(s) Performed: LAPAROSCOPIC JEJUNOSTOMY (N/A ) ESOPHAGOGASTRODUODENOSCOPY (EGD) (N/A )  Patient Location: PACU  Anesthesia Type:General  Level of Consciousness: awake, alert  and oriented  Airway & Oxygen Therapy: Patient Spontanous Breathing and Patient connected to nasal cannula oxygen  Post-op Assessment: Report given to RN, Post -op Vital signs reviewed and stable and Patient moving all extremities  Post vital signs: Reviewed and stable  Last Vitals:  Vitals Value Taken Time  BP 98/69 05/31/20 1155  Temp    Pulse 66 05/31/20 1207  Resp 16 05/31/20 1207  SpO2 100 % 05/31/20 1207  Vitals shown include unvalidated device data.  Last Pain:  Vitals:   05/31/20 1155  TempSrc:   PainSc: Asleep      Patients Stated Pain Goal: 3 (05/28/20 0602)  Complications: No complications documented.

## 2020-05-31 NOTE — Anesthesia Preprocedure Evaluation (Addendum)
Anesthesia Evaluation  Patient identified by MRN, date of birth, ID band Patient awake    Reviewed: Allergy & Precautions, NPO status , Patient's Chart, lab work & pertinent test results  History of Anesthesia Complications Negative for: history of anesthetic complications  Airway Mallampati: II  TM Distance: >3 FB Neck ROM: Full    Dental  (+) Dental Advisory Given, Poor Dentition, Missing   Pulmonary Current Smoker and Patient abstained from smoking.,    Pulmonary exam normal        Cardiovascular hypertension, + CAD, + Past MI and + Peripheral Vascular Disease   Rhythm:Regular Rate:Bradycardia     Neuro/Psych PSYCHIATRIC DISORDERS Depression CVA, Residual Symptoms    GI/Hepatic GERD  Medicated and Poorly Controlled,(+)     substance abuse  cocaine use,  Esophageal stricture    Endo/Other   Hyponatremia, Na 129 Hypophosphatemia, P 2.0   Renal/GU negative Renal ROS     Musculoskeletal negative musculoskeletal ROS (+)   Abdominal   Peds  Hematology  (+) anemia ,   Anesthesia Other Findings Malnutrition Covid+ 05/23/20   Reproductive/Obstetrics                            Anesthesia Physical Anesthesia Plan  ASA: III  Anesthesia Plan: General   Post-op Pain Management:    Induction: Intravenous and Rapid sequence  PONV Risk Score and Plan: 2 and Treatment may vary due to age or medical condition, Ondansetron, Dexamethasone and Midazolam  Airway Management Planned: Oral ETT  Additional Equipment: None  Intra-op Plan:   Post-operative Plan: Extubation in OR  Informed Consent: I have reviewed the patients History and Physical, chart, labs and discussed the procedure including the risks, benefits and alternatives for the proposed anesthesia with the patient or authorized representative who has indicated his/her understanding and acceptance.     Dental advisory  given  Plan Discussed with: CRNA, Anesthesiologist and Surgeon  Anesthesia Plan Comments:        Anesthesia Quick Evaluation

## 2020-05-31 NOTE — Brief Op Note (Signed)
05/23/2020 - 05/31/2020  11:27 AM  PATIENT:  Ralph [name]  55 y.o. male  PRE-OPERATIVE DIAGNOSIS:  Esophageal stricture  POST-OPERATIVE DIAGNOSIS:  Esophageal stricture  PROCEDURE: ESOPHAGOGASTRODUODENOSCOPY (EGD),  ATTEMPTED NGT PLACEMENT, and LAPAROSCOPIC JEJUNOSTOMY   SURGEON:  Surgeon(s) and Role:    Lightfoot, Eliezer Lofts, MD - Primary  PHYSICIAN ASSISTANT: Doree Fudge PA-C  ANESTHESIA:   general  EBL:  Per anesthesia record  BLOOD ADMINISTERED:none  DRAINS: Feeding jejunstomy   LOCAL MEDICATIONS USED:  LIDOCAINE   COUNTS:  YES  DICTATION: .Dragon Dictation  PLAN OF CARE: Admit to inpatient   PATIENT DISPOSITION:  PACU - hemodynamically stable.   Delay start of Pharmacological VTE agent (>24hrs) due to surgical blood loss or risk of bleeding: yes

## 2020-05-31 NOTE — Progress Notes (Signed)
     [address]       [place] [address]             [phone]      No events  Vitals:   05/31/20 0355 05/31/20 0543  BP: 124/88 103/69  Pulse: (!) 54 63  Resp: 18 18  Temp: 98.7 F (37.1 C) 99.7 F (37.6 C)  SpO2: 99% 97%   Alert NAD EWOB   OR today for EGD, NG tube placement, possible laparoscopy and J-tube placement.  Meiah Zamudio Keane Scrape

## 2020-05-31 NOTE — Progress Notes (Addendum)
Nutrition Follow-up  DOCUMENTATION CODES:   Severe malnutrition in context of chronic illness,Underweight  INTERVENTION:   Initiate tube feeding via J-tube: Jevity 1.2 begin at 20 ml/h, increase by 10 ml every 8 hours to goal rate of 70 ml/h  Provides 2016 kcal, 93 gm protein, 1361 ml free water daily.  Continue to monitor magnesium, potassium, and phosphorus, MD to replete as needed, as pt is at risk for refeeding syndrome given severe PCM.  Continue Boost Breeze po TID when diet is advanced.    NUTRITION DIAGNOSIS:   Severe Malnutrition related to chronic illness (esophageal stricture) as evidenced by severe muscle depletion,severe fat depletion.  Ongoing  GOAL:   Patient will meet greater than or equal to 90% of their needs   Progressing  MONITOR:   PO intake,Supplement acceptance  REASON FOR ASSESSMENT:   Consult Assessment of nutrition requirement/status  ASSESSMENT:   55 yo male admitted with dysphagia. PMH includes esophagitis s/p dilation, alcohol abuse, CAD, dysphagia, CVA, MI, HTN, HLD.   S/P attempted NG tube placement by Cardiothoracic Surgery today. Ended up placing J-tube instead. Received MD Consult for TF initiation and management. NPO this morning for procedure.  Labs reviewed. Na 129, K 3.1, phos 2  Low K and phos likely related to refeeding syndrome, although patient is taking in minimal nutrition by mouth. Patient is receiving KCl and K-Phos for repletion.  Medications reviewed and include MVI with minerals, IV KCl, IV K-Phos. IVF: D5 NS at 50 ml/h  No new weight available.  Diet Order:   Diet Order            Diet NPO time specified  Diet effective midnight                 EDUCATION NEEDS:   Education needs have been addressed  Skin:  Skin Assessment: Reviewed RN Assessment  Last BM:  no BM documented  Height:   Ht Readings from Last 1 Encounters:  05/24/20 5\' 9"  (1.753 m)    Weight:   Wt Readings from Last 1  Encounters:  05/24/20 54.9 kg    BMI:  Body mass index is 17.87 kg/m.  Estimated Nutritional Needs:   Kcal:  1900-2100  Protein:  85-100 gm  Fluid:  >/= 1.8 L    05/26/20, RD, LDN, CNSC Please refer to Amion for contact information.

## 2020-05-31 NOTE — Addendum Note (Signed)
Addendum  created 05/31/20 1635 by Colon Flattery, CRNA   LDA properties accepted

## 2020-05-31 NOTE — Anesthesia Procedure Notes (Signed)
Procedure Name: Intubation Date/Time: 05/31/2020 9:17 AM Performed by: Amadeo Garnet, CRNA Pre-anesthesia Checklist: Patient identified, Emergency Drugs available, Suction available and Patient being monitored Patient Re-evaluated:Patient Re-evaluated prior to induction Oxygen Delivery Method: Circle system utilized Preoxygenation: Pre-oxygenation with 100% oxygen Induction Type: IV induction and Rapid sequence Laryngoscope Size: Mac and 4 Grade View: Grade I Tube type: Oral Number of attempts: 1 Airway Equipment and Method: Stylet and Oral airway Placement Confirmation: ETT inserted through vocal cords under direct vision,  positive ETCO2 and breath sounds checked- equal and bilateral Secured at: 22 cm Tube secured with: Tape Dental Injury: Teeth and Oropharynx as per pre-operative assessment

## 2020-05-31 NOTE — Anesthesia Postprocedure Evaluation (Addendum)
Anesthesia Post Note  Patient: Ralph [name]  Procedure(s) Performed: LAPAROSCOPIC JEJUNOSTOMY (N/A ) ESOPHAGOGASTRODUODENOSCOPY (EGD) (N/A )     Patient location during evaluation: Other Anesthesia Type: General Level of consciousness: awake and alert Pain management: pain level controlled Vital Signs Assessment: post-procedure vital signs reviewed and stable Respiratory status: spontaneous breathing, nonlabored ventilation, respiratory function stable and patient connected to nasal cannula oxygen Cardiovascular status: blood pressure returned to baseline and stable Postop Assessment: no apparent nausea or vomiting Anesthetic complications: no   No complications documented.  Last Vitals:  Vitals:   05/31/20 1210 05/31/20 1225  BP: 97/67 95/66  Pulse: 63 (!) 59  Resp: 14 15  Temp:  (!) 36.4 C  SpO2: 97% 98%    Last Pain:  Vitals:   05/31/20 1225  TempSrc:   PainSc: 2                  Beryle Lathe

## 2020-06-01 ENCOUNTER — Encounter (HOSPITAL_COMMUNITY): Payer: Self-pay | Admitting: Thoracic Surgery (Cardiothoracic Vascular Surgery)

## 2020-06-01 LAB — BASIC METABOLIC PANEL
Anion gap: 6 (ref 5–15)
BUN: <5 mg/dL — ABNORMAL LOW (ref 6–20)
CO2: 26 mmol/L (ref 22–32)
Calcium: 8.2 mg/dL — ABNORMAL LOW (ref 8.9–10.3)
Chloride: 101 mmol/L (ref 98–111)
Creatinine, Ser: 0.52 mg/dL — ABNORMAL LOW (ref 0.61–1.24)
GFR, Estimated: >60 mL/min (ref >60)
Glucose, Bld: 117 mg/dL — ABNORMAL HIGH (ref 70–99)
Potassium: 3.8 mmol/L (ref 3.5–5.1)
Sodium: 133 mmol/L — ABNORMAL LOW (ref 135–145)

## 2020-06-01 LAB — CBC
HCT: 34.4 % — ABNORMAL LOW (ref 39.0–52.0)
Hemoglobin: 11.9 g/dL — ABNORMAL LOW (ref 13.0–17.0)
MCH: 32.3 pg (ref 26.0–34.0)
MCHC: 34.6 g/dL (ref 30.0–36.0)
MCV: 93.5 fL (ref 80.0–100.0)
Platelets: 201 10*3/uL (ref 150–400)
RBC: 3.68 MIL/uL — ABNORMAL LOW (ref 4.22–5.81)
RDW: 14.7 % (ref 11.5–15.5)
WBC: 7.3 10*3/uL (ref 4.0–10.5)
nRBC: 0 % (ref 0.0–0.2)

## 2020-06-01 LAB — GLUCOSE, CAPILLARY
Glucose-Capillary: 124 mg/dL — ABNORMAL HIGH (ref 70–99)
Glucose-Capillary: 123 mg/dL — ABNORMAL HIGH (ref 70–99)
Glucose-Capillary: 112 mg/dL — ABNORMAL HIGH (ref 70–99)
Glucose-Capillary: 112 mg/dL — ABNORMAL HIGH (ref 70–99)
Glucose-Capillary: 94 mg/dL (ref 70–99)
Glucose-Capillary: 102 mg/dL — ABNORMAL HIGH (ref 70–99)

## 2020-06-01 LAB — PHOSPHORUS: Phosphorus: 3.2 mg/dL (ref 2.5–4.6)

## 2020-06-01 LAB — SURGICAL PATHOLOGY

## 2020-06-01 LAB — MAGNESIUM: Magnesium: 1.9 mg/dL (ref 1.7–2.4)

## 2020-06-01 MED ORDER — NICOTINE 21 MG/24HR TD PT24
21.0000 mg | MEDICATED_PATCH | Freq: Every day | TRANSDERMAL | Status: DC
  Administered 2020-06-01 – 2020-06-13 (×13): 21 mg via TRANSDERMAL
  Filled 2020-06-01 (×14): qty 1

## 2020-06-01 MED ORDER — AMOXICILLIN-POT CLAVULANATE 400-57 MG/5ML PO SUSR: 800.0000 mg | Freq: Two times a day (BID) | ORAL | Status: DC

## 2020-06-01 MED ORDER — ACETAMINOPHEN 325 MG PO TABS
650.0000 mg | ORAL_TABLET | Freq: Four times a day (QID) | ORAL | Status: DC
  Filled 2020-06-01: qty 2

## 2020-06-01 MED ORDER — ACETAMINOPHEN 650 MG RE SUPP: 650.0000 mg | Freq: Four times a day (QID) | RECTAL | Status: DC

## 2020-06-01 MED ORDER — ENOXAPARIN SODIUM 40 MG/0.4ML ~~LOC~~ SOLN
40.0000 mg | SUBCUTANEOUS | Status: DC
  Administered 2020-06-02 – 2020-06-13 (×12): 40 mg via SUBCUTANEOUS
  Filled 2020-06-01 (×14): qty 0.4

## 2020-06-01 MED ORDER — DOXYCYCLINE CALCIUM 50 MG/5ML PO SYRP: 100.0000 mg | ORAL_SOLUTION | Freq: Two times a day (BID) | ORAL | Status: DC

## 2020-06-01 MED ORDER — MORPHINE SULFATE (PF) 2 MG/ML IV SOLN
2.0000 mg | INTRAVENOUS | Status: DC | PRN
  Administered 2020-06-01 – 2020-06-02 (×9): 2 mg via INTRAVENOUS
  Filled 2020-06-01 (×9): qty 1

## 2020-06-01 MED ORDER — DOXYCYCLINE CALCIUM 50 MG/5ML PO SYRP
100.0000 mg | ORAL_SOLUTION | Freq: Two times a day (BID) | ORAL | Status: DC
  Administered 2020-06-01 – 2020-06-04 (×6): 100 mg
  Filled 2020-06-01 (×8): qty 10

## 2020-06-01 MED ORDER — HYDROCODONE-ACETAMINOPHEN 7.5-325 MG/15ML PO SOLN
15.0000 mL | Freq: Four times a day (QID) | ORAL | Status: DC
  Administered 2020-06-01 – 2020-06-04 (×11): 15 mL
  Filled 2020-06-01 (×11): qty 15

## 2020-06-01 MED ORDER — ATORVASTATIN CALCIUM 80 MG PO TABS
80.0000 mg | ORAL_TABLET | Freq: Every day | ORAL | Status: DC
  Administered 2020-06-06: 80 mg via JEJUNOSTOMY
  Filled 2020-06-01 (×3): qty 1

## 2020-06-01 MED ORDER — GABAPENTIN 250 MG/5ML PO SOLN
300.0000 mg | Freq: Three times a day (TID) | ORAL | Status: DC
  Administered 2020-06-01 – 2020-06-14 (×38): 300 mg
  Filled 2020-06-01 (×45): qty 6

## 2020-06-01 MED ORDER — AMOXICILLIN-POT CLAVULANATE 400-57 MG/5ML PO SUSR
800.0000 mg | Freq: Two times a day (BID) | ORAL | Status: AC
  Administered 2020-06-01 – 2020-06-03 (×6): 800 mg
  Filled 2020-06-01 (×7): qty 10

## 2020-06-01 NOTE — Progress Notes (Signed)
Family Medicine Teaching Service Daily Progress Note Intern Pager: [phone]  Patient name: Ralph [name] Medical record number: [id number] Date of birth: 29-Jul-1965 Age: 55 y.o. Gender: male  Primary Care Provider: Moses Manners, MD Consultants: GI, cardiothoracic surgery, general surgery   Code Status: Full Code  Pt Overview and Major Events to Date:  4/18 admitted 4/22 EGD with findings of significant esophageal stricture not amenable to dilation 4/26 EGD, unsuccessful NG tube placement, laparoscopic jejunostomy  Assessment and Plan: Ralph [name] is a 54 y.o. male presenting with dysphagia and weight loss secondary to severe esophageal stricture. PMH is significant forgastritis and esophageal stricture, CVA, tobacco use, HTN, HLD.  Esophageal stricture J-tube placed yesterday by cardiothoracic surgery.  Tube feeds have been initiated by RD.  Pain (esophageal and incisional) not well controlled currently, will schedule APAP and hydrocodone and increase morphine. Electrolytes wnl. EGD negative for malignancy.  Planning for esophageal resection at some point. - GI and cardiothoracic surgery following, appreciate involvement - scheduled APAP 650 mg q6h - scheduled hydrocodone-APAP 7.5-325 mg q6h - morphine 2 mg q2h prn - f/u EGD biopsy results - mIVF D5NS 50 ml/hr - PO pantoprazole 40 mg daily - monitor Mg and P for refeeding syndrome - set up GI follow-up prior to discharge  Pneumonia Patient afebrile, no dyspnea and breathing comfortably on room air.  Leukocytosis resolved, WBC 7.3.  Urine culture negative and blood cultures negative for 2 days, so we will transition to oral antibiotics now that J-tube has been placed. Plan for 5 days of total antibiotics. - PO doxycycline (4/27-) - PO amoxicillin-clavulanate (4/27-) - s/p IV ampicillin-sulbactam (4/24-4/27) - s/p IV vancomycin (4/25-4/26)  COVID-19 Mild infection, no supplemental oxygen requirement throughout admission.  -  s/p remdesivir x 3 days  Chest pain Likely secondary to esophageal etiology. - IV morphine 2 mg q3h - APAP prn  Protein calorie malnutrition With cachexia and weight loss in the setting of poor oral intake secondary to esophageal stricture.  - RD consulted - tube feeds per RD  HTN Normotensive without any antihypertensives.  HLD Onatorvastatin, though appears patient is not taking this. -restart atorvastatin via tube  CVA Patient has history of stroke in 2018 with residual left-sided weakness. Home medications include atorvastatinand ASA, though appears patient is not taking these. -encouraged that patient be on aspirin given CVA history, recommend discussing with PCP outpatient to start on aspirin  Tobacco use disorder Patient reports smoking 1PPD since age of 29, recently cut back to 1/2PPD.  - nicotine patch - consider restarting bupropion   Alcoholuse He reports that he drinks 1-2 40 oz beers per night. Last drink 4/18.Denies history of alcohol withdrawal. - thiamine  MDD Prescribed bupropion but is not taking at home. -consider restarting bupropion outpatient  FEN/GI: feeds per J-tube, mIVF D5 NS 50 mL/hr PPx: LMWH held given recent procedure  Disposition: med-surg, likely home when medically stable  Subjective:  NAOE.  Patient reports significant worsening of his pain, epigastric and at his incision sites.  He states that the oral medication that he had been receiving committed to is not helping.  He also reported that his morphine dose was decreased last night.  Denies shortness of breath.  He states he was placed on oxygen after the surgery yesterday, but he feels he does not need the oxygen.  Objective: Temp:  [97.5 F (36.4 C)-98.2 F (36.8 C)] 98.2 F (36.8 C) (04/27 0400) Pulse Rate:  [59-63] 60 (04/27 0400) Resp:  [14-20]  20 (04/27 0400) BP: (95-105)/(66-79) 105/79 (04/27 0400) SpO2:  [95 %-98 %] 95 % (04/27 0400) Weight:  [60.8 kg] 60.8  kg (04/27 0625) Physical Exam: General: Cachectic appearing middle-aged male, NAD Cardiovascular: RRR, no murmurs Respiratory: Clear to auscultation bilaterally, no respiratory distress Abdomen: Soft, significant epigastric tenderness, positive bowel sounds, J-tube insertion site clean without surrounding erythema or purulence Extremities: WWP, no edema   Laboratory: Recent Labs  Lab 05/30/20 0134 05/31/20 0132 06/01/20 0400  WBC 16.5* 12.7* 7.3  HGB 13.3 12.7* 11.9*  HCT 37.8* 36.0* 34.4*  PLT 195 182 201   Recent Labs  Lab 05/26/20 2059 05/27/20 0425 05/30/20 0134 05/31/20 0132 06/01/20 0400  NA 135   < > 130* 129* 133*  K 3.7   < > 3.1* 3.5 3.8  CL 103   < > 102 100 101  CO2 25   < > 23 22 26   BUN <5*   < > <5* <5* <5*  CREATININE 0.60*   < > 0.58* 0.56* 0.52*  CALCIUM 8.8*   < > 7.9* 8.0* 8.2*  PROT 5.7*  --   --   --   --   BILITOT 0.8  --   --   --   --   ALKPHOS 61  --   --   --   --   ALT 10  --   --   --   --   AST 15  --   --   --   --   GLUCOSE 106*   < > 112* 101* 117*   < > = values in this interval not displayed.     Imaging/Diagnostic Tests: No new imaging.   , MD 06/01/2020, 7:42 AM PGY-1, [facility] Family Medicine FPTS Intern pager: [phone], text pages welcome

## 2020-06-01 NOTE — Op Note (Signed)
[address]       Ralph [name] [address]             [phone]        06/01/2020  Patient:  Ralph [name] Pre-Op Dx: Esophageal stricture   Cachexia   Malnutrition   History of hiatal hernia   History of Barrett's esophagus. Post-op Dx: Same Procedure: - Esophagogastroscopy -Laparoscopic placement of jejunostomy tube   Surgeon and Role:      * Ralph [name], Ralph Lofts, MD - Primary Assistant: Ralph Earthly, PA-C  Anesthesia  general EBL: Minimal  Blood Administration: None Specimen: None   Counts: correct   Indications: This is a 55 year old gentleman with history of esophageal strictures.  He has previously undergone multiple dilations but represented with a 50 pound weight loss and inability to tolerate any p.o. intake.  He previously underwent an endoscopy which identified a pinpoint esophageal stricture approximately 1 mm in diameter.  CTS was consulted to assist with management.  Findings: On endoscopy his esophagus was patulous.  There was numerous secretions in the distal esophagus.  This had to be cleared out with saline irrigation.  There was a pinpoint esophageal stricture at 35 cm from the incisors.  I attempted to pass a Dobbhoff tube endoscopically but the stricture was too small for this.  I then attempted a 50 French nasogastric tube but again the stricture was too small for the tube to pass.  I then seated the tube just above the stricture.  At this point I elected to proceed with a laparoscopic jejunostomy.  There was no evidence of any peritoneal disease.  Operative Technique: After the risks, benefits and alternatives were thoroughly discussed, the patient was brought to the operative theatre.  Anesthesia was induced. The patient was prepped and draped in normal sterile fashion.  An appropriate surgical pause was performed, and pre-operative antibiotics were dosed accordingly.  The gastroscope was advanced through the oropharynx into the  cervical esophagus under direct visualization.  We passed the scope down the esophagus and cleaned out the distal portion due to multiple secretions.  A pinpoint esophageal stricture was noted at 35 cm.  I attempted to pass a Dobbhoff tube endoscopically but this was unsuccessful.  I then transition to a nasogastric tube that was 32 [place] in size but this was also sick unsuccessful.  I then parked the nasogastric tube proximal to the esophageal stricture to aid clearing of his secretions.  We then transitioned to the laparoscopic portion of the procedure.  The patient was prepped and draped in normal sterile fashion.  We began with a 5 mm trocar and placed this using an Optiview technique at the level of his umbilicus in his mid clavicular line.  The abdomen was then insufflated.  I then placed a 12 mm trocar in his right lower quadrant and another 5 mm trocar in his right upper quadrant.  We lifted the colon to identify the ligament of Treitz and then tracked the small bowel down approximately 60 cm.  Using a Covidien needle passer we tacked the small bowel up to the abdominal wall.  We then placed a needle through the abdominal wall into the small bowel and ensured that the wire passed distally.  Using Seldinger technique and serial dilations we were able to pass a 18 French jejunostomy tube into the small bowel.  We confirmed its intraluminal position by insufflating air.  We then placed another tacking suture proximally to ensure  that the small bowel did not towards.  The balloon of the jejunostomy tube was filled with 7 mm of saline.  The insufflation was then released and the port sites were closed with absorbable suture.   The patient tolerated the procedure without any immediate complications, and was transferred to the PACU in stable condition.  Ralph [name]

## 2020-06-01 NOTE — TOC Progression Note (Signed)
Transition of Care [facility]) - Progression Note    Patient Details  Name: Ralph [name] MRN: [id number] Date of Birth: 04-13-1965  Transition of [facility]) CM/SW Contact  Erin Sons, [place] Phone Number: 06/01/2020, 10:01 AM  Clinical Narrative:     CSW called pt on room phone for substance use consult. CSW explained purpose of contact. Pt states "yea man I'm good." He explains he is in pain and doesn't want to continue to talk. CSW will provide list of treatment resources and alcohol use information. TOC is available for any additional needs.   Expected Discharge Plan: Home/Self Care Barriers to Discharge: Continued Medical Work up  Expected Discharge Plan and Services Expected Discharge Plan: Home/Self Care   Discharge Planning Services: CM Consult   Living arrangements for the past 2 months: Single Family Home                                       Social Determinants of Health (SDOH) Interventions    Readmission Risk Interventions No flowsheet data found.

## 2020-06-01 NOTE — TOC Progression Note (Signed)
Transition of Care [facility]) - Progression Note    Patient Details  Name: Ralph [name] MRN: [id number] Date of Birth: [birth year]  [facility]) CM/SW Contact  [name], RN Phone Number: 06/01/2020, 4:47 PM  Clinical Narrative:   Patient admitted from home w wife. Esophageal stricture, now with J tube and tube feedings. Planning for esophagectomy once malnutrition/ albumin improved.   Jeri Modena with [facility] consulted for tube feedings, pump, and supplies, she will follow and screen for charity services. Current feeding is Jevity 1.2 begin at 20 ml/h, increase by 10 ml every 8 hours to goal rate of 70 ml/h   Patient will need referred to charity home health provider closer to time of discharge if needed.  TOC will follow for medication assistance/ MATCH needs as well.       Expected Discharge Plan: Home/Self Care Barriers to Discharge: Continued Medical Work up  Expected Discharge Plan and Services Expected Discharge Plan: Home/Self Care   Discharge Planning Services: CM Consult   Living arrangements for the past 2 months: Single Family Home                                       Social Determinants of Health (SDOH) Interventions    Readmission Risk Interventions No flowsheet data found.

## 2020-06-01 NOTE — Progress Notes (Signed)
     [address]       [place] [address]             [phone]      No events  Vitals:   06/01/20 0400 06/01/20 1201  BP: 105/79 102/73  Pulse: 60 (!) 50  Resp: 20 18  Temp: 98.2 F (36.8 C) 98.4 F (36.9 C)  SpO2: 95% 94%   Alert NAD abd soft Tube feeds  55 yo male with critical esophageal stricture, likely benign given multiple previous biopsies Feeding tube in place Will follow pre-albumin.  Once improving will schedule esophagectomy Continue NG tube decompression for now.  Arionna Hoggard Keane Scrape

## 2020-06-02 LAB — BASIC METABOLIC PANEL
Anion gap: 5 (ref 5–15)
BUN: 5 mg/dL — ABNORMAL LOW (ref 6–20)
CO2: 25 mmol/L (ref 22–32)
Calcium: 7.9 mg/dL — ABNORMAL LOW (ref 8.9–10.3)
Chloride: 104 mmol/L (ref 98–111)
Creatinine, Ser: 0.48 mg/dL — ABNORMAL LOW (ref 0.61–1.24)
GFR, Estimated: >60 mL/min (ref >60)
Glucose, Bld: 96 mg/dL (ref 70–99)
Potassium: 3.4 mmol/L — ABNORMAL LOW (ref 3.5–5.1)
Sodium: 134 mmol/L — ABNORMAL LOW (ref 135–145)

## 2020-06-02 LAB — CBC
HCT: 34.9 % — ABNORMAL LOW (ref 39.0–52.0)
Hemoglobin: 12.2 g/dL — ABNORMAL LOW (ref 13.0–17.0)
MCH: 32.5 pg (ref 26.0–34.0)
MCHC: 35 g/dL (ref 30.0–36.0)
MCV: 93.1 fL (ref 80.0–100.0)
Platelets: 214 10*3/uL (ref 150–400)
RBC: 3.75 MIL/uL — ABNORMAL LOW (ref 4.22–5.81)
RDW: 14.7 % (ref 11.5–15.5)
WBC: 8.3 10*3/uL (ref 4.0–10.5)
nRBC: 0 % (ref 0.0–0.2)

## 2020-06-02 LAB — GLUCOSE, CAPILLARY
Glucose-Capillary: 90 mg/dL (ref 70–99)
Glucose-Capillary: 95 mg/dL (ref 70–99)
Glucose-Capillary: 95 mg/dL (ref 70–99)
Glucose-Capillary: 101 mg/dL — ABNORMAL HIGH (ref 70–99)
Glucose-Capillary: 77 mg/dL (ref 70–99)

## 2020-06-02 LAB — PHOSPHORUS: Phosphorus: 2 mg/dL — ABNORMAL LOW (ref 2.5–4.6)

## 2020-06-02 LAB — MAGNESIUM: Magnesium: 1.8 mg/dL (ref 1.7–2.4)

## 2020-06-02 MED ORDER — POTASSIUM CHLORIDE 20 MEQ PO PACK
20.0000 meq | PACK | Freq: Once | ORAL | Status: AC
  Administered 2020-06-02: 20 meq
  Filled 2020-06-02: qty 1

## 2020-06-02 MED ORDER — SODIUM PHOSPHATES 45 MMOLE/15ML IV SOLN
15.0000 mmol | Freq: Once | INTRAVENOUS | Status: DC
  Filled 2020-06-02: qty 5

## 2020-06-02 MED ORDER — ACETAMINOPHEN 160 MG/5ML PO SOLN
650.0000 mg | Freq: Four times a day (QID) | ORAL | Status: DC
  Administered 2020-06-02 – 2020-06-14 (×42): 650 mg
  Filled 2020-06-02 (×42): qty 20.3

## 2020-06-02 MED ORDER — SENNOSIDES 8.8 MG/5ML PO SYRP
5.0000 mL | ORAL_SOLUTION | Freq: Every day | ORAL | Status: DC
  Administered 2020-06-02 – 2020-06-13 (×10): 5 mL
  Filled 2020-06-02 (×14): qty 5

## 2020-06-02 MED ORDER — SENNA 8.6 MG PO TABS: 1.0000 | ORAL_TABLET | Freq: Every day | ORAL | Status: DC

## 2020-06-02 MED ORDER — MORPHINE SULFATE (PF) 2 MG/ML IV SOLN
2.0000 mg | INTRAVENOUS | Status: DC | PRN
  Administered 2020-06-02 – 2020-06-03 (×8): 2 mg via INTRAVENOUS
  Filled 2020-06-02 (×8): qty 1

## 2020-06-02 MED ORDER — POLYETHYLENE GLYCOL 3350 17 G PO PACK
17.0000 g | PACK | Freq: Every day | ORAL | Status: DC
  Administered 2020-06-02 – 2020-06-03 (×2): 17 g via ORAL
  Filled 2020-06-02 (×4): qty 1

## 2020-06-02 MED ORDER — DEXTROSE-NACL 5-0.9 % IV SOLN: INTRAVENOUS | Status: DC

## 2020-06-02 MED ORDER — POTASSIUM PHOSPHATES 15 MMOLE/5ML IV SOLN
15.0000 mmol | Freq: Once | INTRAVENOUS | Status: AC
  Administered 2020-06-02: 15 mmol via INTRAVENOUS
  Filled 2020-06-02 (×3): qty 5

## 2020-06-02 NOTE — Progress Notes (Addendum)
Family Medicine Teaching Service Daily Progress Note Intern Pager: [phone]  Patient name: Ralph [name] Medical record number: [id number] Date of birth: 27-Jan-1966 Age: 55 y.o. Gender: male  Primary Care Provider: Moses Manners, MD Consultants: GI, cardiothoracic surgery, general surgery   Code Status: Full Code  Pt Overview and Major Events to Date:  4/18 admitted 4/22 EGD with findings of significant esophageal stricture not amenable to dilation 4/26 EGD, unsuccessful NG tube placement, laparoscopic jejunostomy  Assessment and Plan: Ralph [name] is a 55 y.o. male presenting with dysphagia and weight loss secondary to severe esophageal stricture. PMH is significant forgastritis and esophageal stricture, CVA, tobacco use, HTN, HLD.  Esophageal stricture S/p J-tube placement 4/26 subjectively improving with tube feeds, working on increasing to goal rate. Pain well-controlled, will work on weaning IV pain medications to prepare for discharge. - GI and cardiothoracic surgery following, appreciate involvement - scheduled APAP 650 mg q6h - scheduled hydrocodone-APAP 7.5-325 mg q6h - decrease IV morphine 2 mg q3h prn - mIVF D5NS 50 ml/hr - PO pantoprazole 40 mg daily - monitor Mg and P for refeeding syndrome, replete prn - set up GI follow-up prior to discharge  Pneumonia Patient afebrile, no dyspnea and breathing comfortably on room air.  Leukocytosis resolved.  Urine culture negative and blood cultures negative for 4 days. Doing well on oral antibiotics. Plan for 5 days of total antibiotics. - PO doxycycline (4/27-) - PO amoxicillin-clavulanate (4/27-) - s/p IV ampicillin-sulbactam (4/24-4/27) - s/p IV vancomycin (4/25-4/26)  COVID-19 Mild infection, no supplemental oxygen requirement throughout admission. 10 days out from positive test 4/18 so will discontinue precautions. - s/p remdesivir x 3 days - d/c airborne/droplet precautions  Protein calorie malnutrition With  cachexia and weight loss in the setting of poor oral intake secondary to esophageal stricture. - RD consulted - tube feeds per RD  HTN Normotensive without any antihypertensives.  HLD Onatorvastatin, though appears patient is not taking this. -restart atorvastatin via tube  CVA Patient has history of stroke in 2018 with residual left-sided weakness. Home medications include atorvastatinand ASA, though appears patient is not taking these. -encouraged that patient be on aspirin given CVA history, recommend discussing with PCP outpatient to start on aspirin  Tobacco use disorder Patient reports smoking 1PPD since age of 66, recently cut back to 1/2PPD.  - nicotine patch - consider restarting bupropion   Alcoholuse He reports that he drinks 1-2 40 oz beers per night. Last drink 4/18.Denies history of alcohol withdrawal. - thiamine  MDD Prescribed bupropion but is not taking at home. -consider restarting bupropion outpatient  FEN/GI: feeds per J-tube PPx: LMWH  Disposition: med-surg, likely home when medically stable  Subjective:  NAOE.  Patient reports pain is significantly better today compared to yesterday with the increased medications.  Denies shortness of breath.  He feels he is gaining his strength back ever since the tube feeds started.  Objective: Temp:  [97.6 F (36.4 C)-98.4 F (36.9 C)] 97.6 F (36.4 C) (04/28 0300) Pulse Rate:  [50-62] 62 (04/28 0300) Resp:  [16-18] 18 (04/28 0300) BP: (102-133)/(73-82) 133/81 (04/28 0300) SpO2:  [90 %-95 %] 93 % (04/28 0300) Weight:  [60.8 kg] 60.8 kg (04/28 0440) Physical Exam: General: Cachectic appearing middle-aged male, NAD Cardiovascular: RRR, no murmurs Respiratory: Clear to auscultation bilaterally, no respiratory distress Abdomen: Soft, diffuse tenderness primarily epigastric and RLQ, positive bowel sounds, J-tube insertion site clean without surrounding erythema or purulence Extremities: WWP, no  edema   Laboratory: Recent Labs  Lab 05/31/20 0132 06/01/20 0400 06/02/20 0139  WBC 12.7* 7.3 8.3  HGB 12.7* 11.9* 12.2*  HCT 36.0* 34.4* 34.9*  PLT 182 201 214   Recent Labs  Lab 05/26/20 2059 05/27/20 0425 05/31/20 0132 06/01/20 0400 06/02/20 0139  NA 135   < > 129* 133* 134*  K 3.7   < > 3.5 3.8 3.4*  CL 103   < > 100 101 104  CO2 25   < > 22 26 25   BUN <5*   < > <5* <5* 5*  CREATININE 0.60*   < > 0.56* 0.52* 0.48*  CALCIUM 8.8*   < > 8.0* 8.2* 7.9*  PROT 5.7*  --   --   --   --   BILITOT 0.8  --   --   --   --   ALKPHOS 61  --   --   --   --   ALT 10  --   --   --   --   AST 15  --   --   --   --   GLUCOSE 106*   < > 101* 117* 96   < > = values in this interval not displayed.     Imaging/Diagnostic Tests: No new imaging.   , MD 06/02/2020, 7:57 AM PGY-1, [facility] Family Medicine FPTS Intern pager: [phone], text pages welcome

## 2020-06-02 NOTE — Evaluation (Signed)
Physical Therapy Evaluation Patient Details Name: Ralph [name] MRN: [id number] DOB: March 29, 1965 Today's Date: 06/02/2020   History of Present Illness  Ralph [name] is a 55 y/o male who reported to ED with complaints of worsening dysphagia, weight loss, and dehydration. Pt admitted on 05/23/20 with dysphagia and incidental covid-19. EGD on 4/22 showed esophageal stricture. EGD again on 4/26 for laparoscopic jejunostomy s/p j-tube. PMH includes esophagitis, esophageal dysphagia, CVA with residual L weakness, MI, back pain, essential HTN, alcohol use, and tobacco use.    Clinical Impression  Pt received in bed, willing to participate in PT but reporting high levels of pain. No physical assist given during mobility, but increased time needed due to pain. Cueing for sequencing and to straighten legs upon standing. Due to increased pain upon moving, ambulation deferred but pt able to complete marching and side stepping with RW at EOB with no LOB and no physical assist. Pt would benefit from PT to improve endurance, increase strength, decrease risk for falls, and maximize independency. Will continue to follow acutely.    Follow Up Recommendations CIR;Supervision for mobility/OOB;Other (comment) (Pt may be limited by insurance situation. Will need to maximize PT during admission)    Equipment Recommendations  Rolling walker with 5" wheels;3in1 (PT);Wheelchair (measurements PT);Wheelchair cushion (measurements PT)    Recommendations for Other Services Rehab consult     Precautions / Restrictions Precautions Precautions: Fall Precaution Comments: NG tube, j-tube Restrictions Weight Bearing Restrictions: No      Mobility  Bed Mobility Overal bed mobility: Needs Assistance Bed Mobility: Supine to Sit     Supine to sit: Supervision;HOB elevated     General bed mobility comments: increased time but no physical assist given, use of bed rails    Transfers Overall transfer level: Needs  assistance Equipment used: Rolling walker (2 wheeled) Transfers: Sit to/from Stand Sit to Stand: Min guard         General transfer comment: min guard for safety, increased time and effort, cueing to straighten legs, but no physical assist given  Ambulation/Gait             General Gait Details: deferred due to increased pain  Stairs            Wheelchair Mobility    Modified Rankin (Stroke Patients Only)       Balance Overall balance assessment: Needs assistance Sitting-balance support: Feet supported Sitting balance-Leahy Scale: Fair Sitting balance - Comments: able to sit at min guard level at EOB   Standing balance support: Bilateral upper extremity supported Standing balance-Leahy Scale: Poor Standing balance comment: Reliant on B UE support                             Pertinent Vitals/Pain      Home Living Family/patient expects to be discharged to:: Other (Comment) (has been staying at a motel) Living Arrangements: Spouse/significant other Available Help at Discharge: Other (Comment);Available PRN/intermittently (significant other) Type of Home: Other(Comment) (motel) Home Access: Stairs to enter   [employer] of Steps: 12 (to motel room where he was staying) Home Layout: One level        Prior Function           Comments: Independent with ADLs, IADLs, and mobility. PTA staying at hotel for 2 weeks with "another girl", but is now looking for new place to live     Hand Dominance   Dominant Hand: Right    Extremity/Trunk  Assessment   Upper Extremity Assessment Upper Extremity Assessment: Defer to OT evaluation    Lower Extremity Assessment Lower Extremity Assessment: Overall WFL for tasks assessed    Cervical / Trunk Assessment Cervical / Trunk Assessment: Kyphotic  Communication   Communication: Expressive difficulties  Cognition Arousal/Alertness: Awake/alert Behavior During Therapy: WFL for tasks  assessed/performed Overall Cognitive Status: Within Functional Limits for tasks assessed                                 General Comments: A&Ox4. Responds appropriate to questions. Speaks in soft tones, somewhat difficult to understand      General Comments      Exercises     Assessment/Plan    PT Assessment Patient needs continued PT services  PT Problem List Decreased strength;Decreased mobility;Decreased activity tolerance;Decreased balance;Decreased knowledge of use of DME       PT Treatment Interventions DME instruction;Therapeutic exercise;Gait training;Balance training;Stair training;Neuromuscular re-education;Functional mobility training;Therapeutic activities;Patient/family education    PT Goals (Current goals can be found in the Care Plan section)  Acute Rehab PT Goals Patient Stated Goal: be able to sit up again without pain PT Goal Formulation: With patient Time For Goal Achievement: 06/16/20 Potential to Achieve Goals: Good    Frequency Min 4X/week   Barriers to discharge        Co-evaluation               AM-PAC PT "6 Clicks" Mobility  Outcome Measure Help needed turning from your back to your side while in a flat bed without using bedrails?: A Little Help needed moving from lying on your back to sitting on the side of a flat bed without using bedrails?: A Little Help needed moving to and from a bed to a chair (including a wheelchair)?: A Little Help needed standing up from a chair using your arms (e.g., wheelchair or bedside chair)?: A Little Help needed to walk in hospital room?: A Lot Help needed climbing 3-5 steps with a railing? : A Lot 6 Click Score: 16    End of Session Equipment Utilized During Treatment: Gait belt Activity Tolerance: Patient limited by pain;Patient limited by fatigue Patient left: in bed;with call bell/phone within reach;with bed alarm set Nurse Communication: Mobility status PT Visit Diagnosis:  Unsteadiness on feet (R26.81);Pain Pain - Right/Left: Left Pain - part of body:  (abdominal area)    Time:  -      Charges:         Conley Rolls, SPT

## 2020-06-02 NOTE — TOC Progression Note (Signed)
Transition of Care [facility]) - Progression Note    Patient Details  Name: Ralph [name] MRN: [id number] Date of Birth: 06-24-1965  [facility]) CM/SW Contact  Lockie Pares, RN Phone Number: 06/02/2020, 4:22 PM  Clinical Narrative:    DME charity will be needed loser to DC 3:1 rolling walker with 5" wheels and wheelchair. Maximizing PT while in house as CIR may be out of the question die to insurance   Expected Discharge Plan: Home/Self Care Barriers to Discharge: Continued Medical Work up  Expected Discharge Plan and Services Expected Discharge Plan: Home/Self Care   Discharge Planning Services: CM Consult   Living arrangements for the past 2 months: Single Family Home                                       Social Determinants of Health (SDOH) Interventions    Readmission Risk Interventions No flowsheet data found.

## 2020-06-02 NOTE — Progress Notes (Signed)
Inpatient Rehab Admissions Coordinator Note:   Per therapy recommendations, pt was screened for CIR candidacy by Estill Dooms, PT, DPT.  At this time pt does not appear to meet functional or medical criteria for CIR.  Would not recommend a consult at this time.  Please contact me with questions.   Estill Dooms, PT, DPT [phone] 06/02/20 4:53 PM

## 2020-06-02 NOTE — Progress Notes (Signed)
Nutrition Follow-up  DOCUMENTATION CODES:   Severe malnutrition in context of chronic illness,Underweight  INTERVENTION:   Continue tube feeding via J-tube: Jevity 1.2, increasing by 10 ml every 8 hours to goal rate of 70 ml/h to provide 2016 kcal, 93 gm protein, 1361 ml free water daily.  Continue to monitor magnesium, potassium, and phosphorus, MD to replete as needed, as pt is at risk for refeeding syndrome given severe PCM.  No BM documented this admission. Consider bowel regimen. May improve with TF advancement.  NUTRITION DIAGNOSIS:   Severe Malnutrition related to chronic illness (esophageal stricture) as evidenced by severe muscle depletion,severe fat depletion.  Ongoing  GOAL:   Patient will meet greater than or equal to 90% of their needs   Progressing  MONITOR:   PO intake,Supplement acceptance  REASON FOR ASSESSMENT:   Consult Assessment of nutrition requirement/status  ASSESSMENT:   55 yo male admitted with dysphagia. PMH includes esophagitis s/p dilation, alcohol abuse, CAD, dysphagia, CVA, MI, HTN, HLD.   J tube in place. Receiving Jevity 1.2, currently at 30 ml/h. Advancing TF rate slowly due to refeeding syndrome. Also monitoring magnesium, potassium, and phosphorus levels. K and phos both low on this morning's lab report. Will need ongoing replacement.   Labs reviewed. Na 134 (L), K 3.4 (L), phos 2 (L), mag 1.8 (low end of normal range)  Medications reviewed and include MVI with minerals, Protonix, Carafate. IVF: D5 NS at 50 ml/h  Weight 60.8 kg up from 54.9 kg on admission  Patient still has not had a BM.  Diet Order:   Diet Order            Diet NPO time specified  Diet effective midnight                 EDUCATION NEEDS:   Education needs have been addressed  Skin:  Skin Assessment: Reviewed RN Assessment  Last BM:  no BM documented  Height:   Ht Readings from Last 1 Encounters:  05/24/20 5\' 9"  (1.753 m)    Weight:   Wt  Readings from Last 1 Encounters:  06/02/20 60.8 kg    BMI:  Body mass index is 19.79 kg/m.  Estimated Nutritional Needs:   Kcal:  1900-2100  Protein:  85-100 gm  Fluid:  >/= 1.8 L    06/04/20, RD, LDN, CNSC Please refer to Amion for contact information.

## 2020-06-03 LAB — BASIC METABOLIC PANEL
Anion gap: 7 (ref 5–15)
BUN: <5 mg/dL — ABNORMAL LOW (ref 6–20)
CO2: 26 mmol/L (ref 22–32)
Calcium: 8.1 mg/dL — ABNORMAL LOW (ref 8.9–10.3)
Chloride: 99 mmol/L (ref 98–111)
Creatinine, Ser: 0.43 mg/dL — ABNORMAL LOW (ref 0.61–1.24)
GFR, Estimated: >60 mL/min (ref >60)
Glucose, Bld: 96 mg/dL (ref 70–99)
Potassium: 3.3 mmol/L — ABNORMAL LOW (ref 3.5–5.1)
Sodium: 132 mmol/L — ABNORMAL LOW (ref 135–145)

## 2020-06-03 LAB — MAGNESIUM: Magnesium: 1.7 mg/dL (ref 1.7–2.4)

## 2020-06-03 LAB — PHOSPHORUS: Phosphorus: 2.9 mg/dL (ref 2.5–4.6)

## 2020-06-03 LAB — CBC
HCT: 35.8 % — ABNORMAL LOW (ref 39.0–52.0)
Hemoglobin: 12.5 g/dL — ABNORMAL LOW (ref 13.0–17.0)
MCH: 32.2 pg (ref 26.0–34.0)
MCHC: 34.9 g/dL (ref 30.0–36.0)
MCV: 92.3 fL (ref 80.0–100.0)
Platelets: 207 10*3/uL (ref 150–400)
RBC: 3.88 MIL/uL — ABNORMAL LOW (ref 4.22–5.81)
RDW: 14.7 % (ref 11.5–15.5)
WBC: 8.5 10*3/uL (ref 4.0–10.5)
nRBC: 0 % (ref 0.0–0.2)

## 2020-06-03 LAB — GLUCOSE, CAPILLARY
Glucose-Capillary: 101 mg/dL — ABNORMAL HIGH (ref 70–99)
Glucose-Capillary: 109 mg/dL — ABNORMAL HIGH (ref 70–99)
Glucose-Capillary: 109 mg/dL — ABNORMAL HIGH (ref 70–99)
Glucose-Capillary: 85 mg/dL (ref 70–99)
Glucose-Capillary: 94 mg/dL (ref 70–99)
Glucose-Capillary: 94 mg/dL (ref 70–99)
Glucose-Capillary: 97 mg/dL (ref 70–99)

## 2020-06-03 LAB — CULTURE, BLOOD (ROUTINE X 2)
Culture: NO GROWTH
Culture: NO GROWTH
Special Requests: ADEQUATE

## 2020-06-03 LAB — PREALBUMIN: Prealbumin: 6.6 mg/dL — ABNORMAL LOW (ref 18–38)

## 2020-06-03 MED ORDER — MORPHINE SULFATE (PF) 2 MG/ML IV SOLN
1.0000 mg | INTRAVENOUS | Status: DC | PRN
  Administered 2020-06-03 – 2020-06-04 (×6): 1 mg via INTRAVENOUS
  Filled 2020-06-03 (×6): qty 1

## 2020-06-03 MED ORDER — POTASSIUM CHLORIDE 20 MEQ PO PACK
20.0000 meq | PACK | Freq: Once | ORAL | Status: AC
  Administered 2020-06-03: 20 meq
  Filled 2020-06-03: qty 1

## 2020-06-03 MED ORDER — HYDROXYZINE HCL 10 MG/5ML PO SYRP
10.0000 mg | ORAL_SOLUTION | Freq: Once | ORAL | Status: AC
  Administered 2020-06-03: 10 mg
  Filled 2020-06-03: qty 5

## 2020-06-03 NOTE — Progress Notes (Signed)
Physical Therapy Treatment Patient Details Name: Ralph [name] MRN: [id number] DOB: April 20, 1965 Today's Date: 06/03/2020    History of Present Illness Ralph [name] is a 55 y/o male who reported to ED with complaints of worsening dysphagia, weight loss, and dehydration. Pt admitted on 05/23/20 with dysphagia and incidental covid-19. EGD on 4/22 showed esophageal stricture. EGD again on 4/26 for laparoscopic jejunostomy s/p j-tube. PMH includes esophagitis, esophageal dysphagia, CVA with residual L weakness, MI, back pain, essential HTN, alcohol use, and tobacco use.    PT Comments    Pt making slow, steady progress. Will continue to work toward increasing pt's activity tolerance.    Follow Up Recommendations  Supervision for mobility/OOB;Home health PT     Equipment Recommendations  Rolling walker with 5" wheels;3in1 (PT);Wheelchair (measurements PT);Wheelchair cushion (measurements PT)    Recommendations for Other Services       Precautions / Restrictions Precautions Precautions: Fall Precaution Comments: j-tube    Mobility  Bed Mobility Overal bed mobility: Needs Assistance Bed Mobility: Supine to Sit;Sit to Supine     Supine to sit: Supervision;HOB elevated Sit to supine: Supervision;HOB elevated   General bed mobility comments: Incr time and effort    Transfers Overall transfer level: Needs assistance Equipment used: Rolling walker (2 wheeled) Transfers: Sit to/from Stand Sit to Stand: Supervision         General transfer comment: Verbal cues for hand placement. Incr time to rise  Ambulation/Gait Ambulation/Gait assistance: Supervision Gait Distance (Feet): 80 Feet Assistive device: Rolling walker (2 wheeled) Gait Pattern/deviations: Step-through pattern;Decreased step length - right;Decreased step length - left;Trunk flexed Gait velocity: decr Gait velocity interpretation: <1.31 ft/sec, indicative of household ambulator General Gait Details: Assist for lines and  safety. No loss of balance.   Stairs             Wheelchair Mobility    Modified Rankin (Stroke Patients Only)       Balance Overall balance assessment: Needs assistance Sitting-balance support: Feet supported Sitting balance-Leahy Scale: Fair     Standing balance support: Bilateral upper extremity supported Standing balance-Leahy Scale: Poor Standing balance comment: walker and supervision for static standing                            Cognition Arousal/Alertness: Awake/alert Behavior During Therapy: Flat affect Overall Cognitive Status: Within Functional Limits for tasks assessed                                        Exercises      General Comments        Pertinent Vitals/Pain Pain Assessment: Faces Faces Pain Scale: Hurts whole lot Pain Location: L side near incision Pain Descriptors / Indicators: Constant;Grimacing;Guarding;Sharp    Home Living                      Prior Function            PT Goals (current goals can now be found in the care plan section) Progress towards PT goals: Progressing toward goals    Frequency    Min 3X/week      PT Plan Discharge plan needs to be updated;Frequency needs to be updated    Co-evaluation              AM-PAC PT "6 Clicks" Mobility   Outcome Measure  Help needed turning from your back to your side while in a flat bed without using bedrails?: A Little Help needed moving from lying on your back to sitting on the side of a flat bed without using bedrails?: A Little Help needed moving to and from a bed to a chair (including a wheelchair)?: A Little Help needed standing up from a chair using your arms (e.g., wheelchair or bedside chair)?: A Little Help needed to walk in hospital room?: A Little Help needed climbing 3-5 steps with a railing? : A Little 6 Click Score: 18    End of Session Equipment Utilized During Treatment: Gait belt Activity Tolerance:  Patient limited by pain;Patient limited by fatigue Patient left: in bed;with call bell/phone within reach;with bed alarm set Nurse Communication: Mobility status PT Visit Diagnosis: Pain;Other abnormalities of gait and mobility (R26.89) Pain - Right/Left: Left Pain - part of body:  (flank)     Time: 2836-6294 PT Time Calculation (min) (ACUTE ONLY): 24 min  Charges:  $Gait Training: 23-37 mins                     [facility] PT Acute Rehabilitation Services Pager [phone] Office [phone]    Ralph [name] Ralph [name] 06/03/2020, 3:49 PM

## 2020-06-03 NOTE — Progress Notes (Signed)
     [address]       [name] [address]             [phone]       Pre-albumin 6.6 Needs improvement in nutritional status prior to surgery Ok with D/c and follow-up while awaiting optimization.  Mozes Sagar Keane Scrape

## 2020-06-03 NOTE — Progress Notes (Signed)
Family Medicine Teaching Service Daily Progress Note Intern Pager: [phone]  Patient name: ALEC JAROS Medical record number: [id number] Date of birth: Mar 21, 1965 Age: 55 y.o. Gender: male  Primary Care Provider: Moses Manners, MD Consultants: GI, cardiothoracic surgery, general surgery   Code Status: Full Code  Pt Overview and Major Events to Date:  4/18 admitted 4/22 EGD with findings of significant esophageal stricture not amenable to dilation 4/26 EGD, unsuccessful NG tube placement, laparoscopic jejunostomy  Assessment and Plan: KENRIC GINGER is a 55 y.o. male presenting with dysphagia and weight loss secondary to severe esophageal stricture. PMH is significant forgastritis and esophageal stricture, CVA, tobacco use, HTN, HLD.  Esophageal stricture S/p J-tube placement 4/26 subjectively improving with tube feeds, working on increasing to goal rate. Pain adequately controlled, will work on weaning IV pain medications to prepare for discharge. Plan for esophageal resection in 2 weeks, can d/c in the interim if he is able to get [facility] services. - GI and cardiothoracic surgery following, appreciate involvement - scheduled APAP 650 mg q6h - scheduled hydrocodone-APAP 7.5-325 mg q6h - decrease IV morphine 1 mg q3h prn - mIVF D5NS 50 ml/hr - PO pantoprazole 40 mg daily - monitor Mg and P for refeeding syndrome, replete prn - set up GI follow-up prior to discharge - c/s TOC for dispo  Pneumonia Patient afebrile, no dyspnea and breathing comfortably on room air.  Leukocytosis resolved.  Urine culture negative and blood cultures negative. Doing well on oral antibiotics. Plan for 5 days of total antibiotics, will complete today. - PO doxycycline (4/27-) - PO amoxicillin-clavulanate (4/27-) - s/p IV ampicillin-sulbactam (4/24-4/27) - s/p IV vancomycin (4/25-4/26)  COVID-19 Mild infection, no supplemental oxygen requirement throughout admission. Precautions discontinued. - s/p  remdesivir x 3 days  Protein calorie malnutrition With cachexia and weight loss in the setting of poor oral intake secondary to esophageal stricture. - RD consulted - tube feeds per RD  HTN Normotensive without any antihypertensives.  HLD Onatorvastatin, though appears patient is not taking this. -restart atorvastatin via tube  CVA Patient has history of stroke in 2018 with residual left-sided weakness. Home medications include atorvastatinand ASA, though appears patient is not taking these. -encouraged that patient be on aspirin given CVA history, recommend discussing with PCP outpatient to start on aspirin  Tobacco use disorder Patient reports smoking 1PPD since age of 55, recently cut back to 1/2PPD.  - nicotine patch - consider restarting bupropion   Alcoholuse He reports that he drinks 1-2 40 oz beers per night. Last drink 4/18.Denies history of alcohol withdrawal. - thiamine  MDD Prescribed bupropion but is not taking at home. -consider restarting bupropion outpatient  FEN/GI: feeds per J-tube PPx: LMWH  Disposition: med-surg, likely home with services when medically stable  Subjective:  NAOE. Pain adequately controlled, but patient states he had to wait a long time for RN to give him pain medications overnight so was in more pain. Otherwise, he denies SOB. Agreeable to continue weaning pain medications.  Objective: Temp:  [98.1 F (36.7 C)-98.4 F (36.9 C)] 98.1 F (36.7 C) (04/29 0411) Pulse Rate:  [54-97] 54 (04/29 0411) Resp:  [18-20] 18 (04/29 0411) BP: (94-122)/(66-86) 94/66 (04/29 0411) SpO2:  [93 %-96 %] 93 % (04/29 0411) Weight:  [60.8 kg] 60.8 kg (04/29 0249) Physical Exam: General: Cachectic appearing middle-aged male, NAD Cardiovascular: RRR, no murmurs Respiratory: Clear to auscultation bilaterally, no respiratory distress Abdomen: Soft, diffuse tenderness primarily epigastric and RLQ, positive bowel sounds, J-tube insertion site  clean without surrounding erythema or purulence Extremities: WWP, no edema   Laboratory: Recent Labs  Lab 06/01/20 0400 06/02/20 0139 06/03/20 0325  WBC 7.3 8.3 8.5  HGB 11.9* 12.2* 12.5*  HCT 34.4* 34.9* 35.8*  PLT 201 214 207   Recent Labs  Lab 06/01/20 0400 06/02/20 0139 06/03/20 0325  NA 133* 134* 132*  K 3.8 3.4* 3.3*  CL 101 104 99  CO2 26 25 26   BUN <5* 5* <5*  CREATININE 0.52* 0.48* 0.43*  CALCIUM 8.2* 7.9* 8.1*  GLUCOSE 117* 96 96     Imaging/Diagnostic Tests: No new imaging.   , MD 06/03/2020, 7:40 AM PGY-1, [facility] Family Medicine FPTS Intern pager: [phone], text pages welcome

## 2020-06-04 LAB — MAGNESIUM: Magnesium: 1.6 mg/dL — ABNORMAL LOW (ref 1.7–2.4)

## 2020-06-04 LAB — CBC
HCT: 34.6 % — ABNORMAL LOW (ref 39.0–52.0)
Hemoglobin: 12 g/dL — ABNORMAL LOW (ref 13.0–17.0)
MCH: 32.1 pg (ref 26.0–34.0)
MCHC: 34.7 g/dL (ref 30.0–36.0)
MCV: 92.5 fL (ref 80.0–100.0)
Platelets: 181 10*3/uL (ref 150–400)
RBC: 3.74 MIL/uL — ABNORMAL LOW (ref 4.22–5.81)
RDW: 14.6 % (ref 11.5–15.5)
WBC: 10.1 10*3/uL (ref 4.0–10.5)
nRBC: 0 % (ref 0.0–0.2)

## 2020-06-04 LAB — BASIC METABOLIC PANEL
Anion gap: 6 (ref 5–15)
BUN: <5 mg/dL — ABNORMAL LOW (ref 6–20)
CO2: 25 mmol/L (ref 22–32)
Calcium: 7.8 mg/dL — ABNORMAL LOW (ref 8.9–10.3)
Chloride: 98 mmol/L (ref 98–111)
Creatinine, Ser: 0.45 mg/dL — ABNORMAL LOW (ref 0.61–1.24)
GFR, Estimated: >60 mL/min (ref >60)
Glucose, Bld: 124 mg/dL — ABNORMAL HIGH (ref 70–99)
Potassium: 3.3 mmol/L — ABNORMAL LOW (ref 3.5–5.1)
Sodium: 129 mmol/L — ABNORMAL LOW (ref 135–145)

## 2020-06-04 LAB — GLUCOSE, CAPILLARY
Glucose-Capillary: 100 mg/dL — ABNORMAL HIGH (ref 70–99)
Glucose-Capillary: 107 mg/dL — ABNORMAL HIGH (ref 70–99)
Glucose-Capillary: 110 mg/dL — ABNORMAL HIGH (ref 70–99)
Glucose-Capillary: 112 mg/dL — ABNORMAL HIGH (ref 70–99)
Glucose-Capillary: 115 mg/dL — ABNORMAL HIGH (ref 70–99)
Glucose-Capillary: 81 mg/dL (ref 70–99)

## 2020-06-04 LAB — PHOSPHORUS: Phosphorus: 2.1 mg/dL — ABNORMAL LOW (ref 2.5–4.6)

## 2020-06-04 MED ORDER — MAGNESIUM SULFATE 50 % IJ SOLN: 1.0000 g | Freq: Once | INTRAMUSCULAR | Status: DC

## 2020-06-04 MED ORDER — HYDROXYZINE HCL 10 MG/5ML PO SYRP
10.0000 mg | ORAL_SOLUTION | Freq: Once | ORAL | Status: AC
  Administered 2020-06-04: 10 mg
  Filled 2020-06-04: qty 5

## 2020-06-04 MED ORDER — POTASSIUM & SODIUM PHOSPHATES 280-160-250 MG PO PACK
1.0000 | PACK | Freq: Three times a day (TID) | ORAL | Status: DC
  Administered 2020-06-04 – 2020-06-07 (×13): 1 via ORAL
  Filled 2020-06-04 (×14): qty 1

## 2020-06-04 MED ORDER — MAGNESIUM SULFATE IN D5W 1-5 GM/100ML-% IV SOLN
1.0000 g | Freq: Once | INTRAVENOUS | Status: AC
  Administered 2020-06-04: 1 g via INTRAVENOUS
  Filled 2020-06-04: qty 100

## 2020-06-04 MED ORDER — HYDROCODONE-ACETAMINOPHEN 7.5-325 MG/15ML PO SOLN
15.0000 mL | ORAL | Status: DC | PRN
  Administered 2020-06-04 – 2020-06-05 (×6): 15 mL
  Filled 2020-06-04 (×6): qty 15

## 2020-06-04 MED ORDER — HYDROCODONE-ACETAMINOPHEN 7.5-325 MG/15ML PO SOLN: 25.0000 mL | Freq: Four times a day (QID) | ORAL | Status: DC

## 2020-06-04 NOTE — Progress Notes (Addendum)
Family Medicine Teaching Service Daily Progress Note Intern Pager: [phone]  Patient name: Ralph [name] Medical record number: [id number] Date of birth: 1965/03/19 Age: 55 y.o. Gender: male  Primary Care Provider: Moses Manners, MD Consultants: GI, CT surgery, Gen surgery  Code Status: Full   Pt Overview and Major Events to Date:  4/18 admitted 4/22 EGD with findings of significant esophageal stricture not amenable to dilation 4/26 EGD, unsuccessful NG tube placement, laparoscopic jejunostomy  Assessment and Plan: Ralph [name] is a 55 y.o. male presenting with dysphagia and weight loss secondary to severe esophageal stricture. PMH is significant forgastritis and esophageal stricture, CVA, tobacco use, HTN, HLD.  Esophageal obstruction  S/p J-tube placement 4/26, working on increase to goal rate of tube feedings. Pain adequately controlled with plan to continue to wean IV pain medications in preparation for discharge. Tentative plan for esophagectomy in 2 weeks. -GI and cardiothoracic surgery following, appreciate involvement -scheduled APAP 650 mg q6h -scheduled hydrocodone-APAP 7.5-325 mg q4h  -IV morphine discontinued, converted to oral regimen with assistance of pharmacy  -mIVF D5NS 50 ml/hr -protonix 40 mg daily per tube -carafate 1g daily per tube -monitor Mg and P for refeeding syndrome, replete prn -ensure patient has GI follow-up prior to discharge -TOC for dispo: potentially home with [facility] or SNF placement   Pneumonia Most recent CXR notable for volume loss in the left hemothorax with patchy airspace involving left lower lung concerning for possible pneumonia. Patient remains afebrile, no dyspnea and breathing comfortably on room air.  Leukocytosis resolved. Both blood and urine cultures negative.   - PO doxycycline (4/27-) - PO amoxicillin-clavulanate (4/27-4/29) - s/p IV ampicillin-sulbactam (4/24-4/27) - s/p IV vancomycin (4/25-4/26)  COVID-19 Admitted with  mild symptoms, overall asymptomatic.  - s/p remdesivir x 3 days -10 day quarantine period completed, COVID precautions removed  Protein calorie malnutrition With cachexia and weight loss in the setting of poor oral intake secondary to esophageal stricture. -tube feeds per RD -nutrition recs: increasing by 10 mL 18h to goal rate of 70 mL/hr to provide 2016 kcal daily. -Mg 1.6 and Phos 2.1 this morning with appropriate repletion ordered, continue to monitor Mg and Phos for refeeding syndrome   Hypokalemia  K 3.3 this morning. -KCl 20 mEq ordered -monitor BMP  HTN Normotensive BP at 123/80. Not on any home anti-hypertensive therapy. -monitor BP   HLD Prescribed atorvastatin although not taking it as home med prior to this hospitalization.  -atorvastatin 80 mg daily via tube  CVA Patient has history of stroke in 2018 with residual left-sided weakness. Home medications include atorvastatinand ASA, though appears patient is not taking these. -defer to PCP regarding patient to start taking aspirin   Tobacco use disorder Patient reports smoking 1PPD since age of 58, recently cut back to 1/2PPD.  -nicotine patch -consider restarting bupropion   Alcoholuse Reported drinking 1-2 40 oz beers per night. Last drink 4/18.Denies history of alcohol withdrawal. -thiamine  MDD Previously prescribed bupropion but is not taking at home. -consider restarting bupropion outpatient  FEN/GI: J-tube in place PPx: LMWH   Status is: Inpatient  Remains inpatient appropriate because:Inpatient level of care appropriate due to severity of illness   Dispo: The patient is from: Home              Anticipated d/c is to: SNF              Patient currently is not medically stable to d/c.   Difficult to place patient No  Subjective:  No significant overnight events reported. Patient had just woken up with I arrived. He states that he is a light sleeper. He denies dyspnea and  worsening pain. Still endorsing some abdominal pain that is adequately controlled at this time. Updated patient on the plan thus far, he is more inclined to going home with home health rather than other options. I explained to him that we still need to optimize tube feeds as they are not at goal yet. He voices understanding, he has no further questions or concerns at this time.   Objective: Temp:  [98.1 F (36.7 C)-98.5 F (36.9 C)] 98.5 F (36.9 C) (04/29 2314) Pulse Rate:  [54-63] 61 (04/29 2314) Resp:  [17-18] 17 (04/29 2314) BP: (94-123)/(66-82) 123/80 (04/29 2314) SpO2:  [93 %-96 %] 93 % (04/29 2314) Weight:  [60.8 kg] 60.8 kg (04/29 0249) Physical Exam: General: Patient laying comfortably in bed, in no acute distress. Cardiovascular: RRR, no murmurs or gallops auscultated  Respiratory: CTAB, no rales, rhonchi or wheezing noted, no respiratory distress noted  Abdomen: soft, generalized tenderness on light palpation, J-tube in place without notable drainage noted Extremities: radial and distal pulses strong and equal bilaterally, no LE edema noted bilaterally Psych: mood appropriate   Laboratory: Recent Labs  Lab 06/01/20 0400 06/02/20 0139 06/03/20 0325  WBC 7.3 8.3 8.5  HGB 11.9* 12.2* 12.5*  HCT 34.4* 34.9* 35.8*  PLT 201 214 207   Recent Labs  Lab 06/01/20 0400 06/02/20 0139 06/03/20 0325  NA 133* 134* 132*  K 3.8 3.4* 3.3*  CL 101 104 99  CO2 26 25 26   BUN <5* 5* <5*  CREATININE 0.52* 0.48* 0.43*  CALCIUM 8.2* 7.9* 8.1*  GLUCOSE 117* 96 96      Imaging/Diagnostic Tests: No results found.  , DO 06/04/2020, 12:03 AM PGY-1, [facility] Family Medicine FPTS Intern pager: [phone], text pages welcome

## 2020-06-04 NOTE — Progress Notes (Signed)
Patient called out to notify nurse that his NG tube came out with movement going to bedside commode.No drainage at this time. Internal med notified.

## 2020-06-04 NOTE — TOC Progression Note (Addendum)
Transition of Care [facility]) - Progression Note    Patient Details  Name: Ralph [name] MRN: [id number] Date of Birth: [birth year]  Transition of Care [facility]) CM/SW Contact  [name], RN Phone Number: 06/04/2020, 8:55 AM  Clinical Narrative:    [name], charity provider, who can accept for PT ST RN. They will not be able to start services until Monday. Spoke w [name], [facility] who will assess today for charity vs private pay for tube feeds, and pump. Notified by [name] and [name]  that [facility] will be provider for charity tube feeds and supplies. Needs order for tube feeds and pump  [facility] does not provide teaching for feeds, therefore discharge will need to be no sooner than Monday when home health nurse can meet patient at home.    Spoke w patient's wife on the phone. She confirms that they stay at a hotel. [facility] [address] [place]  Their room is on the second floor, there is not a first floor unit available at this time. There is no elevator. We discussed Medical Transport for transportion home. He was able to go up and down stairs with her assistance prior to admission (ambulated 80 feet w RW and PT yesterday).  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 Wife drives. They have 3 children whom she states they see daily, and 7 grandchildren.  PCP- [facility], Pharmacy [facility] [place]. Uses [facility] for prescriptions.  Wife states they are in the process of getting Medicaid/ Disability. She spoke with the caseworker last week.   DME-  Patient will not need 3/1 due to close proximity of bathroom in hotel room per discussion w wife and her preference. Wife declined WC. Would like RW for DC.  [name],[name] Significant other [phone]  [phone]       Expected Discharge Plan: Home/Self Care Barriers to Discharge: Continued Medical Work up  Expected  Discharge Plan and Services Expected Discharge Plan: Home/Self Care   Discharge Planning Services: CM Consult   Living arrangements for the past 2 months: Single Family Home                           HH Arranged: PT,RN,Speech Therapy HH Agency: [facility] Date [facility] Agency Contacted: 06/04/20 Time HH Agency Contacted: [phone] Representative spoke with at [facility] Agency: [name]   Social Determinants of Health (SDOH) Interventions    Readmission Risk Interventions No flowsheet data found.

## 2020-06-04 NOTE — Progress Notes (Signed)
OT Cancellation Note  Patient Details Name: Ralph [name] MRN: [id number] DOB: 07/17/1965   Cancelled Treatment:     Pt. Refused OT evaluation in the morning stating he was in too much pain. Pt. Did request pain medicine.   Suheyb Raucci 06/04/2020, 12:29 PM

## 2020-06-05 LAB — BASIC METABOLIC PANEL
Anion gap: 8 (ref 5–15)
BUN: 5 mg/dL — ABNORMAL LOW (ref 6–20)
CO2: 24 mmol/L (ref 22–32)
Calcium: 7.8 mg/dL — ABNORMAL LOW (ref 8.9–10.3)
Chloride: 101 mmol/L (ref 98–111)
Creatinine, Ser: 0.4 mg/dL — ABNORMAL LOW (ref 0.61–1.24)
GFR, Estimated: >60 mL/min (ref >60)
Glucose, Bld: 168 mg/dL — ABNORMAL HIGH (ref 70–99)
Potassium: 3.5 mmol/L (ref 3.5–5.1)
Sodium: 133 mmol/L — ABNORMAL LOW (ref 135–145)

## 2020-06-05 LAB — CBC
HCT: 33.3 % — ABNORMAL LOW (ref 39.0–52.0)
Hemoglobin: 11.2 g/dL — ABNORMAL LOW (ref 13.0–17.0)
MCH: 32 pg (ref 26.0–34.0)
MCHC: 33.6 g/dL (ref 30.0–36.0)
MCV: 95.1 fL (ref 80.0–100.0)
Platelets: 208 10*3/uL (ref 150–400)
RBC: 3.5 MIL/uL — ABNORMAL LOW (ref 4.22–5.81)
RDW: 14.6 % (ref 11.5–15.5)
WBC: 8.6 10*3/uL (ref 4.0–10.5)
nRBC: 0 % (ref 0.0–0.2)

## 2020-06-05 LAB — GLUCOSE, CAPILLARY
Glucose-Capillary: 102 mg/dL — ABNORMAL HIGH (ref 70–99)
Glucose-Capillary: 104 mg/dL — ABNORMAL HIGH (ref 70–99)
Glucose-Capillary: 110 mg/dL — ABNORMAL HIGH (ref 70–99)
Glucose-Capillary: 112 mg/dL — ABNORMAL HIGH (ref 70–99)
Glucose-Capillary: 97 mg/dL (ref 70–99)
Glucose-Capillary: 98 mg/dL (ref 70–99)

## 2020-06-05 LAB — PHOSPHORUS: Phosphorus: 2.6 mg/dL (ref 2.5–4.6)

## 2020-06-05 LAB — MAGNESIUM: Magnesium: 1.8 mg/dL (ref 1.7–2.4)

## 2020-06-05 MED ORDER — HYDROCODONE-ACETAMINOPHEN 7.5-325 MG/15ML PO SOLN
15.0000 mL | Freq: Four times a day (QID) | ORAL | Status: DC | PRN
  Administered 2020-06-05 – 2020-06-06 (×4): 15 mL
  Filled 2020-06-05 (×4): qty 15

## 2020-06-05 MED ORDER — HYDROXYZINE HCL 10 MG/5ML PO SYRP
10.0000 mg | ORAL_SOLUTION | Freq: Once | ORAL | Status: AC
  Administered 2020-06-05: 10 mg
  Filled 2020-06-05: qty 5

## 2020-06-05 NOTE — Progress Notes (Addendum)
Family Medicine Teaching Service Daily Progress Note Intern Pager: [phone]  Patient name: Ralph [name] Medical record number: [id number] Date of birth: 1965-07-22 Age: 55 y.o. Gender: male  Primary Care Provider: Moses Manners, MD Consultants: GI, CT surgery, Gen surgery  Code Status: Full   Pt Overview and Major Events to Date:  4/18 admitted 4/22 EGD with findings of significant esophageal stricture not amenable to dilation 4/26 EGD, unsuccessful NG tube placement, laparoscopic jejunostomy  Assessment and Plan: Ralph [name] is a 55 y.o. male presenting with dysphagia and weight loss secondary to severe esophageal stricture. PMH is significant forgastritis and esophageal stricture, CVA, tobacco use, HTN, HLD.  Esophageal obstruction  S/p J-tube placement 4/26, working on increase to goal rate of tube feedings. Pain adequately controlled with plan to continue to wean IV pain medications in preparation for discharge. Tentative plan for esophagectomy in 2 weeks. -GI and cardiothoracic surgery following, appreciate involvement -scheduled APAP 650 mg q6h -scheduled hydrocodone-APAP 7.5-325 mg q4h  -IV morphine discontinued, converted to oral regimen with assistance of pharmacy  -mIVF D5NS 50 ml/hr -protonix 40 mg daily per tube -carafate 1g daily per tube -monitor Mg and P for refeeding syndrome, replete prn -ensure patient has GI follow-up prior to discharge -TOC for dispo: potentially home with [facility] or SNF placement   Pneumonia Most recent CXR notable for volume loss in the left hemothorax with patchy airspace involving left lower lung concerning for possible pneumonia.  Patient doing well at this time, afebrile overnight, no dyspnea and is breathing comfortably today. Leukocytosis resolved. Both blood and urine cultures negative.   - PO doxycycline (4/27-4/30) - PO amoxicillin-clavulanate (4/27-4/29) - s/p IV ampicillin-sulbactam (4/24-4/27) - s/p IV vancomycin  (4/25-4/26)  COVID-19 Admitted with mild symptoms, overall asymptomatic.  - s/p remdesivir x 3 days -10 day quarantine period completed, COVID precautions removed  Protein calorie malnutrition With cachexia and weight loss in the setting of poor oral intake secondary to esophageal stricture. -tube feeds per RD -nutrition recs: increasing by 10 mL 18h to goal rate of 70 mL/hr to provide 2016 kcal daily. -Mg 1.8 and Phos 2.6 this morning. Continue to monitor Mg and Phos for refeeding syndrome   Hypokalemia  K 3.5 this morning. -Continue Phos-Nak as ordered -monitor BMP  HTN Normotensive BP at 123/80. Not on any home anti-hypertensive therapy. -monitor BP   HLD Prescribed atorvastatin although not taking it as home med prior to this hospitalization.  -atorvastatin 80 mg daily via tube  CVA Patient has history of stroke in 2018 with residual left-sided weakness. Home medications include atorvastatinand ASA, though appears patient is not taking these. -defer to PCP regarding patient to start taking aspirin   Tobacco use disorder Patient reports smoking 1PPD since age of 32, recently cut back to 1/2PPD.  -nicotine patch -consider restarting bupropion   Alcoholuse Reported drinking 1-2 40 oz beers per night. Last drink 4/18.Denies history of alcohol withdrawal. -thiamine  MDD Previously prescribed bupropion but is not taking at home. -consider restarting bupropion outpatient  FEN/GI: J-tube in place PPx: LMWH   Status is: Inpatient  Remains inpatient appropriate because:Inpatient level of care appropriate due to severity of illness   Dispo: The patient is from: Home              Anticipated d/c is to: SNF              Patient currently is not medically stable to d/c.   Difficult to place patient No  Subjective:  No significant events overnight reported by nursing staff.  Patient is complaining that he has not received his medications yet this morning.   He also says that he is having pain around the site of where his J-tube was placed as well as above the site.  Objective: Temp:  [98.8 F (37.1 C)-98.9 F (37.2 C)] 98.8 F (37.1 C) (05/01 0434) Pulse Rate:  [60-92] 92 (05/01 0434) Resp:  [17-20] 17 (05/01 0434) BP: (103-104)/(66-70) 103/67 (05/01 0434) SpO2:  [91 %-94 %] 91 % (05/01 0434) Weight:  [57.3 kg] 57.3 kg (05/01 0416) Physical Exam: General: Patient is lying comfortably in bed, no acute distress at this time Cardiovascular: RRR, no murmurs or gallops auscultated  Respiratory: CTAB, no rales, rhonchi or wheezing noted, no respiratory distress noted  Abdomen: soft,  J-tube in place without notable drainage noted, generalized tenderness noted Extremities: radial and distal pulses strong and equal bilaterally, no LE edema noted bilaterally Psych: mood appropriate   Laboratory: Recent Labs  Lab 06/03/20 0325 06/04/20 0052 06/05/20 0054  WBC 8.5 10.1 8.6  HGB 12.5* 12.0* 11.2*  HCT 35.8* 34.6* 33.3*  PLT 207 181 208   Recent Labs  Lab 06/03/20 0325 06/04/20 0052 06/05/20 0054  NA 132* 129* 133*  K 3.3* 3.3* 3.5  CL 99 98 101  CO2 26 25 24   BUN <5* <5* 5*  CREATININE 0.43* 0.45* 0.40*  CALCIUM 8.1* 7.8* 7.8*  GLUCOSE 96 124* 168*   Imaging/Diagnostic Tests: No results found.  , MD 06/05/2020, 8:00 AM PGY-2, [facility] Family Medicine FPTS Intern pager: [phone], text pages welcome

## 2020-06-05 NOTE — Progress Notes (Signed)
Family Medicine Teaching Service Daily Progress Note Intern Pager: [phone]  Patient name: Ralph [name] Medical record number: [id number] Date of birth: 1965-03-31 Age: 55 y.o. Gender: male  Primary Care Provider: Moses Manners, MD Consultants: GI, CT surgery, Gen surgery  Code Status: Full   Pt Overview and Major Events to Date:  4/18 admitted 4/22 EGD with findings of significant esophageal stricture not amenable to dilation 4/26 EGD, unsuccessful NG tube placement, laparoscopic jejunostomy  Assessment and Plan: Ralph Sigmon Irbyis a 55 y.o.malepresenting with dysphagia and weight loss secondary to severe esophageal stricture. PMH is significant forgastritis and esophageal stricture, CVA, tobacco use, HTN, HLD.  Esophageal obstruction  S/p J-tube placement 4/26, working on increase to goal rate of tube feedings. Painadequately controlled with plan to continue to wean IV pain medications in preparation for discharge. Tentative plan for esophagectomy in 2 weeks. -GI and cardiothoracic surgery following, appreciate involvement -scheduled APAP 650 mg q6h -scheduled hydrocodone-APAP 7.5-325 mg q4h  -mIVF D5NS 50 ml/hr -protonix 40 mg daily per tube -carafate 1g daily per tube -monitor Mg and P for refeeding syndrome, replete prn -ensure patient has GI follow-up prior to discharge -TOC for dispo: potentially home with [facility] or SNF placement   Pneumonia Most recent CXR notable for volume loss in the left hemothorax with patchy airspace involving left lower lung concerning for possible pneumonia.  Patient doing well and asymptomatic. Leukocytosis resolved. Both blood and urine cultures negative.   - PO doxycycline (4/27-4/30) - PO amoxicillin-clavulanate (4/27-4/29) - s/p IV ampicillin-sulbactam (4/24-4/27) - s/p IV vancomycin (4/25-4/26)  COVID-19 Admitted with mild symptoms, overall asymptomatic.  - s/p remdesivir x 3 days -10 day quarantine period completed, COVID precautions  removed  Protein calorie malnutrition With cachexia and weight loss in the setting of poor oral intake secondary to esophageal stricture. -tube feeds per RD -nutrition recs: 70 mL/hr to provide 2016 kcal daily. -Mg 1.9 and Phos 3.8 this morning. Continue to monitor Mg and Phos for refeeding syndrome   Hypokalemia  K 3.7 this morning. -Continue Phos-Nak as ordered -monitor BMP  HTN Normotensive BP at 108/70. Not on any home anti-hypertensive therapy. -monitor BP   HLD Prescribed atorvastatin although not taking it as home med prior to this hospitalization.  -atorvastatin 80 mg daily via tube  CVA Patient has history of stroke in 2018 with residual left-sided weakness. Home medications include atorvastatinand ASA, though appears patient is not taking these. -defer to PCP regarding patient to start taking aspirin   Tobacco use disorder Patient reports smoking 1PPD since age of 83, recently cut back to 1/2PPD.  -nicotine patch -consider restarting bupropion   Alcoholuse Reported drinking 1-2 40 oz beers per night. Last drink 4/18.Denies history of alcohol withdrawal. -thiamine  MDD Previously prescribed bupropion but is not taking at home. -consider restarting bupropion outpatient  FEN/GI: J-tube in place PPx: LMWH   Status is: Inpatient  Remains inpatient appropriate because:Inpatient level of care appropriate due to severity of illness   Dispo: The patient is from: Home  Anticipated d/c is to: SNF  Patient currently is  medically stable to d/c.              Difficult to place patient No  Subjective:   No acute events overnight. Patient states he does not have a place to go after leaving the hospital, as he can no longer afford the Motel. Is unsure where his wife is at this time. Feels well and denies any other concerns or complaints  Objective: Temp:  [98.8 F (37.1 C)-99.5 F (37.5 C)] 98.8 F (37.1 C) (05/01  2034) Pulse Rate:  [60-92] 62 (05/01 2034) Resp:  [17-18] 18 (05/01 2034) BP: (103-117)/(67-79) 117/79 (05/01 2034) SpO2:  [91 %-92 %] 91 % (05/01 2034) Weight:  [57.3 kg] 57.3 kg (05/01 0416) Physical Exam: General: laying in bed, NAD Cardiovascular: RRR no murmurs Respiratory: CTAB normal WOB Abdomen: soft, non distended. J tube without significant drainiage Extremities: no LE edema   Laboratory: Recent Labs  Lab 06/03/20 0325 06/04/20 0052 06/05/20 0054  WBC 8.5 10.1 8.6  HGB 12.5* 12.0* 11.2*  HCT 35.8* 34.6* 33.3*  PLT 207 181 208   Recent Labs  Lab 06/03/20 0325 06/04/20 0052 06/05/20 0054  NA 132* 129* 133*  K 3.3* 3.3* 3.5  CL 99 98 101  CO2 26 25 24   BUN <5* <5* 5*  CREATININE 0.43* 0.45* 0.40*  CALCIUM 8.1* 7.8* 7.8*  GLUCOSE 96 124* 168*     Imaging/Diagnostic Tests: No results found.  , DO 06/05/2020, 11:02 PM PGY-1, [facility] Family Medicine FPTS Intern pager: [phone], text pages welcome

## 2020-06-05 NOTE — Progress Notes (Signed)
OT Cancellation Note  Patient Details Name: DEMONTE DOBRATZ MRN: [id number] DOB: July 20, 1965   Cancelled Treatment:    Reason Eval/Treat Not Completed: Other (comment). Pt reports he is too much pain to doing anything right now and is waiting on RN to bring pain meds to him. I did let CM know by secure chat that he will need a 3n1 for home use. We will continue to follow for evaluation.  Ignacia Palma, OTR/L Acute Rehab Services Pager [phone] Office [phone]     Evette Georges 06/05/2020, 10:33 AM

## 2020-06-06 LAB — BASIC METABOLIC PANEL
Anion gap: 7 (ref 5–15)
BUN: 5 mg/dL — ABNORMAL LOW (ref 6–20)
CO2: 27 mmol/L (ref 22–32)
Calcium: 8.2 mg/dL — ABNORMAL LOW (ref 8.9–10.3)
Chloride: 100 mmol/L (ref 98–111)
Creatinine, Ser: 0.46 mg/dL — ABNORMAL LOW (ref 0.61–1.24)
GFR, Estimated: >60 mL/min (ref >60)
Glucose, Bld: 105 mg/dL — ABNORMAL HIGH (ref 70–99)
Potassium: 3.7 mmol/L (ref 3.5–5.1)
Sodium: 134 mmol/L — ABNORMAL LOW (ref 135–145)

## 2020-06-06 LAB — GLUCOSE, CAPILLARY
Glucose-Capillary: 101 mg/dL — ABNORMAL HIGH (ref 70–99)
Glucose-Capillary: 107 mg/dL — ABNORMAL HIGH (ref 70–99)
Glucose-Capillary: 92 mg/dL (ref 70–99)
Glucose-Capillary: 93 mg/dL (ref 70–99)
Glucose-Capillary: 94 mg/dL (ref 70–99)
Glucose-Capillary: 97 mg/dL (ref 70–99)

## 2020-06-06 LAB — PHOSPHORUS: Phosphorus: 3.8 mg/dL (ref 2.5–4.6)

## 2020-06-06 LAB — MAGNESIUM: Magnesium: 1.9 mg/dL (ref 1.7–2.4)

## 2020-06-06 MED ORDER — PNEUMOCOCCAL VAC POLYVALENT 25 MCG/0.5ML IJ INJ
0.5000 mL | INJECTION | INTRAMUSCULAR | Status: AC
  Administered 2020-06-07: 0.5 mL via INTRAMUSCULAR
  Filled 2020-06-06: qty 0.5

## 2020-06-06 MED ORDER — HYDROCODONE-ACETAMINOPHEN 7.5-325 MG/15ML PO SOLN
10.0000 mL | Freq: Four times a day (QID) | ORAL | Status: DC | PRN
  Administered 2020-06-06 – 2020-06-07 (×4): 10 mL
  Filled 2020-06-06 (×4): qty 15

## 2020-06-06 MED ORDER — POLYVINYL ALCOHOL 1.4 % OP SOLN
1.0000 [drp] | OPHTHALMIC | Status: DC | PRN
  Administered 2020-06-06: 1 [drp] via OPHTHALMIC
  Filled 2020-06-06: qty 15

## 2020-06-06 MED ORDER — MAGIC MOUTHWASH W/LIDOCAINE
5.0000 mL | Freq: Three times a day (TID) | ORAL | Status: DC | PRN
  Filled 2020-06-06: qty 5

## 2020-06-06 NOTE — TOC Progression Note (Signed)
Transition of Care [facility]) - Progression Note    Patient Details  Name: Ralph [name] MRN: [id number] Date of Birth: March 25, 1965  Transition of Care [facility]) CM/SW Contact  Lockie Pares, RN Phone Number: 06/06/2020, 9:39 AM  Clinical Narrative:    [facility], [place] where patient is staying) further it was found that patient cannot go back to that hotel in [place], so in effect the patient is now homeless. Frances Furbish was calling other HH agencies to see if they could take patient discussed with CSW and DO. Will have to present LOG for SNF.    Expected Discharge Plan: Home/Self Care Barriers to Discharge: Continued Medical Work up  Expected Discharge Plan and Services Expected Discharge Plan: Home/Self Care   Discharge Planning Services: CM Consult   Living arrangements for the past 2 months: Single Family Home                           HH Arranged: PT,RN,Speech Therapy HH Agency: [facility] Date [facility] Agency Contacted: 06/04/20 Time HH Agency Contacted: [phone] Representative spoke with at [facility] Agency: [name]   Social Determinants of Health (SDOH) Interventions    Readmission Risk Interventions No flowsheet data found.

## 2020-06-06 NOTE — Progress Notes (Signed)
Provider paged to make them aware about patients tube is leaking yellow drainage around Peg tube site. Dressing in place. Per patient it has been draining since he went on a walk today.   Per provider okay to continue to use for now. Will call back if anything changes.

## 2020-06-06 NOTE — NC FL2 (Signed)
[place] MEDICAID FL2 LEVEL OF CARE SCREENING TOOL     IDENTIFICATION  Patient Name: Ralph [name] Birthdate: 05/02/1965 Sex: male Admission Date (Current Location): 05/23/2020  [facility] and [place] Number:  Producer, television/film/video and Address:  [facility] [place], [address], [place], [place] [address]      Provider Number: [id number]  Attending Physician Name and Address:  Carney Living, MD  Relative Name and Phone Number:  Don Broach other, [phone]    Current Level of Care: Hospital Recommended Level of Care: Skilled Nursing Facility Prior Approval Number:    Date Approved/Denied:   PASRR Number:   [id number]  Discharge Plan: SNF    Current Diagnoses: Patient Active Problem List   Diagnosis Date Noted  . Protein-calorie malnutrition, severe 05/25/2020  . Cough   . Epididymitis 04/06/2020  . Right inguinal hernia 09/14/2019  . Depression, recurrent (HCC) 06/11/2019  . GERD with esophagitis 05/27/2019  . Dehydration 05/27/2019  . Bradycardia 05/04/2019  . Chest pain, unspecified 05/04/2019  . Does not have health insurance   . Dysphagia   . Hyponatremia   . Protein-calorie malnutrition (HCC)   . Tobacco use disorder   . Neuropathic pain 08/29/2017  . High risk social situation 08/29/2017  . 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  . Pure hypercholesterolemia 11/26/2012  . Cerebrovascular disease 11/25/2012  . Peripheral vascular disease, unspecified (HCC) 11/25/2012  . Coronary artery disease 11/25/2012  . Tobacco abuse counseling 11/25/2012  . Essential hypertension, benign 11/25/2012    Orientation RESPIRATION BLADDER Height & Weight     Self,Time,Situation,Place  Normal Continent Weight: 125 lb 14.1 oz (57.1 kg) Height:  5\' 9"  (175.3 cm)  BEHAVIORAL SYMPTOMS/MOOD NEUROLOGICAL BOWEL NUTRITION STATUS      Continent Feeding tube (feeding supplement (JEVITY 1.2 CAL) liquid 1,000 mL, 28mL/hour)   AMBULATORY STATUS COMMUNICATION OF NEEDS Skin   Limited Assist Verbally Surgical wounds (Closed incision on abdomen)                       Personal Care Assistance Level of Assistance  Bathing,Feeding,Dressing Bathing Assistance: Limited assistance Feeding assistance: Independent Dressing Assistance: Limited assistance     Functional Limitations Info             SPECIAL CARE FACTORS FREQUENCY  PT (By licensed PT)     PT Frequency: 2x/week              Contractures Contractures Info: Not present    Additional Factors Info  Code Status,Allergies,Isolation Precautions Code Status Info: Full Allergies Info: NKA     Isolation Precautions Info: MRSA; no longer on COVID precaustions (+ on 05/23/20)     Current Medications (06/06/2020):  This is the current hospital active medication list Current Facility-Administered Medications  Medication Dose Route Frequency Provider Last Rate Last Admin  . acetaminophen (TYLENOL) 160 MG/5ML solution 650 mg  650 mg Per Tube Q6H 08/06/2020, MD   650 mg at 06/06/20 1247  . atorvastatin (LIPITOR) tablet 80 mg  80 mg Per J Tube Daily Simmons-Robinson, Makiera, MD   80 mg at 06/06/20 1002  . dextrose 5 %-0.9 % sodium chloride infusion   Intravenous Continuous 08/06/20, MD 50 mL/hr at 06/05/20 2223 New Bag at 06/05/20 2223  . enoxaparin (LOVENOX) injection 40 mg  40 mg Subcutaneous Q24H Simmons-Robinson, Makiera, MD   40 mg at 06/06/20 1002  . feeding supplement (JEVITY 1.2 CAL) liquid 1,000 mL  1,000 mL Per Tube Continuous Nestor Ramp, MD 70 mL/hr at 06/06/20 1420 1,000 mL at 06/06/20 1420  . gabapentin (NEURONTIN) 250 MG/5ML solution 300 mg  300 mg Per Tube Q8H Simmons-Robinson, Makiera, MD   300 mg at 06/06/20 1247  . HYDROcodone-acetaminophen (HYCET) 7.5-325 mg/15 ml solution 10 mL  10 mL Per Tube Q6H PRN Derrel Nip, MD   10 mL at 06/06/20 1619  . magic mouthwash w/lidocaine  5 mL Oral TID PRN Idalia Needle, Victoria J, DO      .  melatonin tablet 3 mg  3 mg Per Tube QHS PRN Idamae Lusher, Student-PharmD      . multivitamin with minerals tablet 1 tablet  1 tablet Per Tube Daily Idamae Lusher, Student-PharmD   1 tablet at 06/06/20 1002  . nicotine (NICODERM CQ - dosed in mg/24 hours) patch 21 mg  21 mg Transdermal Daily Littie Deeds, MD   21 mg at 06/06/20 1003  . ondansetron (ZOFRAN) injection 4 mg  4 mg Intravenous Q6H PRN Doree Fudge M, PA-C      . pantoprazole sodium (PROTONIX) 40 mg/20 mL oral suspension 40 mg  40 mg Per Tube BID Idamae Lusher, Student-PharmD   40 mg at 06/06/20 1001  . [START ON 06/07/2020] pneumococcal 23 valent vaccine (PNEUMOVAX-23) injection 0.5 mL  0.5 mL Intramuscular Tomorrow-1000 Derrel Nip, MD      . polyethylene glycol (MIRALAX / GLYCOLAX) packet 17 g  17 g Oral Daily Welborn, Ryan, DO   17 g at 06/03/20 0816  . polyvinyl alcohol (LIQUIFILM TEARS) 1.4 % ophthalmic solution 1 drop  1 drop Both Eyes PRN Carney Living, MD   1 drop at 06/06/20 1420  . potassium & sodium phosphates (PHOS-NAK) 280-160-250 MG packet 1 packet  1 packet Oral TID WC & HS Ganta, Anupa, DO   1 packet at 06/06/20 1617  . sennosides (SENOKOT) 8.8 MG/5ML syrup 5 mL  5 mL Per Tube Daily Chambliss, Estill Batten, MD   5 mL at 06/06/20 1001  . sucralfate (CARAFATE) 1 GM/10ML suspension 1 g  1 g Per Tube TID WC & HS Idamae Lusher, Student-PharmD   1 g at 06/06/20 1616     Discharge Medications: Please see discharge summary for a list of discharge medications.  Relevant Imaging Results:  Relevant Lab Results:   Additional Information SSN: [id number]. Not vaccinated.  Mearl Latin, LCSW

## 2020-06-06 NOTE — Progress Notes (Signed)
Physical Therapy Treatment Patient Details Name: Ralph [name] MRN: [id number] DOB: [birth year] Today's Date: 06/06/2020    History of Present Illness Ralph [name] is a 55 y/o male who reported to ED with complaints of worsening dysphagia, weight loss, and dehydration. Pt admitted on 05/23/20 with dysphagia and incidental covid-19. EGD on 4/22 showed esophageal stricture. EGD again on 4/26 for laparoscopic jejunostomy s/p j-tube. PMH includes esophagitis, esophageal dysphagia, CVA with residual L weakness, MI, back pain, essential HTN, alcohol use, and tobacco use.    PT Comments    Pt maintaining level of functional mobility. Ambulating x 80 feet with a walker at a supervision level. Further distance limited due to J tube leaking (RN notified) and pain. Pt will need to continue to advance strengthening and endurance to a point where he can negotiate 12-13 steps to enter his motel room (no elevator and cannot switch rooms). Will promote mobility as tolerated.    Follow Up Recommendations  Supervision for mobility/OOB;Home health PT     Equipment Recommendations  Rolling walker with 5" wheels;3in1 (PT);Wheelchair (measurements PT);Wheelchair cushion (measurements PT)    Recommendations for Other Services       Precautions / Restrictions Precautions Precautions: Fall Precaution Comments: j-tube Restrictions Weight Bearing Restrictions: No    Mobility  Bed Mobility Overal bed mobility: Modified Independent             General bed mobility comments: No physical assist required, HOB slightly elevated    Transfers Overall transfer level: Needs assistance Equipment used: Rolling walker (2 wheeled) Transfers: Sit to/from Stand Sit to Stand: Supervision         General transfer comment: increased time to rise  Ambulation/Gait Ambulation/Gait assistance: Supervision Gait Distance (Feet): 80 Feet Assistive device: Rolling walker (2 wheeled) Gait Pattern/deviations: Step-through  pattern;Trunk flexed;Decreased stride length Gait velocity: decr   General Gait Details: Assist for lines and safety. No loss of balance.   Stairs             Wheelchair Mobility    Modified Rankin (Stroke Patients Only)       Balance Overall balance assessment: Needs assistance Sitting-balance support: Feet supported Sitting balance-Leahy Scale: Fair     Standing balance support: Bilateral upper extremity supported Standing balance-Leahy Scale: Poor Standing balance comment: walker and supervision for static standing                            Cognition Arousal/Alertness: Awake/alert Behavior During Therapy: Flat affect Overall Cognitive Status: Within Functional Limits for tasks assessed                                        Exercises      General Comments        Pertinent Vitals/Pain Pain Assessment: Faces Faces Pain Scale: Hurts whole lot Pain Location: L side near incision Pain Descriptors / Indicators: Constant;Grimacing;Guarding;Sharp Pain Intervention(s): Limited activity within patient's tolerance;Monitored during session;Patient requesting pain meds-RN notified    Home Living                      Prior Function            PT Goals (current goals can now be found in the care plan section) Acute Rehab PT Goals Patient Stated Goal: decrease pain, be able to safely navigate steps up to  motel Potential to Achieve Goals: Good Progress towards PT goals: Progressing toward goals    Frequency    Min 3X/week      PT Plan Current plan remains appropriate    Co-evaluation              AM-PAC PT "6 Clicks" Mobility   Outcome Measure  Help needed turning from your back to your side while in a flat bed without using bedrails?: None Help needed moving from lying on your back to sitting on the side of a flat bed without using bedrails?: None Help needed moving to and from a bed to a chair (including  a wheelchair)?: A Little Help needed standing up from a chair using your arms (e.g., wheelchair or bedside chair)?: A Little Help needed to walk in hospital room?: A Little Help needed climbing 3-5 steps with a railing? : A Little 6 Click Score: 20    End of Session Equipment Utilized During Treatment: Gait belt Activity Tolerance: Patient limited by pain Patient left: in bed;with call bell/phone within reach;with nursing/sitter in room Nurse Communication: Mobility status PT Visit Diagnosis: Pain;Other abnormalities of gait and mobility (R26.89) Pain - Right/Left: Left Pain - part of body:  (flank)     Time: 4332-9518 PT Time Calculation (min) (ACUTE ONLY): 18 min  Charges:  $Therapeutic Activity: 8-22 mins                     Ralph [name], PT, DPT Acute Rehabilitation Services Pager [phone] Office [phone]    Ralph [name] 06/06/2020, 5:19 PM

## 2020-06-06 NOTE — Evaluation (Signed)
Occupational Therapy Evaluation Patient Details Name: Ralph [name] MRN: [id number] DOB: [birth year] Today's Date: 06/06/2020    History of Present Illness Ralph [name] is a 55 y/o male who reported to ED with complaints of worsening dysphagia, weight loss, and dehydration. Pt admitted on 05/23/20 with dysphagia and incidental covid-19. EGD on 4/22 showed esophageal stricture. EGD again on 4/26 for laparoscopic jejunostomy s/p j-tube. PMH includes esophagitis, esophageal dysphagia, CVA with residual L weakness, MI, back pain, essential HTN, alcohol use, and tobacco use.   Clinical Impression   PTA, pt lives with significant other in second floor motel room. Pt Independent with ADLs, IADLs and mobility without AD. Pt presents now with deficits in strength, standing balance, endurance and pain. Pt mostly limited by pain. Educated on compensatory strategies for pain mgmt during daily tasks with further reinforcement needed. Pt overall Min A for UB ADLs and Mod A for LB ADLs, min guard for sit to stand without AD and taking steps at bedside. Anticipate pt to progress well towards independence if pain more controlled. Pt expresses concern about navigating steps up to motel room (unable to switch to first floor room per pt) and being alone during the day. Consider postacute rehab if pt slow to progress towards goals.     Follow Up Recommendations  Home health OT;Supervision - Intermittent;Other (comment) (consider postacute rehab if continues to be pain limited)    Equipment Recommendations  3 in 1 bedside commode;Other (comment) (Rolling walker)    Recommendations for Other Services       Precautions / Restrictions Precautions Precautions: Fall;Other (comment) Precaution Comments: PEG tube Restrictions Weight Bearing Restrictions: No      Mobility Bed Mobility Overal bed mobility: Needs Assistance Bed Mobility: Supine to Sit;Sit to Supine     Supine to sit: Supervision;HOB elevated Sit to supine:  Supervision;HOB elevated   General bed mobility comments: Incr time and effort    Transfers Overall transfer level: Needs assistance Equipment used: None Transfers: Sit to/from Stand Sit to Stand: Min guard         General transfer comment: min guard to power up with use of bedrail (increased time/effort due to pain) and take side steps up to [facility]. Kyphotic posture due to pain    Balance Overall balance assessment: Needs assistance Sitting-balance support: Feet supported Sitting balance-Leahy Scale: Fair     Standing balance support: Single extremity supported Standing balance-Leahy Scale: Fair Standing balance comment: fair static static, use of UE support for dynamic tasks                           ADL either performed or assessed with clinical judgement   ADL Overall ADL's : Needs assistance/impaired Eating/Feeding: NPO Eating/Feeding Details (indicate cue type and reason): PEG Grooming: Set up;Sitting   Upper Body Bathing: Minimal assistance;Sitting   Lower Body Bathing: Moderate assistance;Sit to/from stand   Upper Body Dressing : Minimal assistance;Sitting Upper Body Dressing Details (indicate cue type and reason): Min A for changing into clean gown, pain limited Lower Body Dressing: Moderate assistance;Sit to/from stand Lower Body Dressing Details (indicate cue type and reason): Educated on bringing feet to self to minimize bending/pain     Toileting- Architect and Hygiene: Moderate assistance;Sit to/from stand;Sitting/lateral lean         General ADL Comments: Limited primarily by pain, which decreases pt movement and strength. Educated on compensatory strategies for LB ADLs     Vision Patient Visual Report:  No change from baseline Vision Assessment?: No apparent visual deficits     Perception     Praxis      Pertinent Vitals/Pain Pain Assessment: Faces Faces Pain Scale: Hurts even more Pain Location: L side near  incision Pain Descriptors / Indicators: Constant;Grimacing;Guarding;Sharp Pain Intervention(s): Monitored during session;Limited activity within patient's tolerance;Repositioned     Hand Dominance Right   Extremity/Trunk Assessment Upper Extremity Assessment Upper Extremity Assessment: Generalized weakness   Lower Extremity Assessment Lower Extremity Assessment: Defer to PT evaluation   Cervical / Trunk Assessment Cervical / Trunk Assessment: Kyphotic   Communication Communication Communication: No difficulties   Cognition Arousal/Alertness: Awake/alert Behavior During Therapy: Flat affect Overall Cognitive Status: Within Functional Limits for tasks assessed                                 General Comments: A&Ox4. Responds appropriate to questions. Speaks in soft tones, somewhat difficult to understand   General Comments  VSS on RA. Pt expresses unable to go home alone due to wife works during the day and concerned about managing steps up into hotel room.    Exercises     Shoulder Instructions      Home Living Family/patient expects to be discharged to:: Other (Comment) (motel) Living Arrangements: Spouse/significant other Available Help at Discharge: Family;Available PRN/intermittently (wife works) Type of Home: Other(Comment) (motel) Home Access: Stairs to enter Secretary/administrator of Steps: 12   Home Layout: One level         Firefighter: Standard         Additional Comments: Has been staying in motel due to in between houses. Flight of steps up to motel room, no elevator and unable to get first floor motel room (apparently wife already tried to ask)      Prior Functioning/Environment Level of Independence: Independent        Comments: Independent with ADLs, IADLs, and mobility.        OT Problem List: Decreased strength;Decreased activity tolerance;Impaired balance (sitting and/or standing);Pain;Decreased knowledge of use of  DME or AE      OT Treatment/Interventions: Self-care/ADL training;Therapeutic exercise;DME and/or AE instruction;Therapeutic activities;Patient/family education;Balance training    OT Goals(Current goals can be found in the care plan section) Acute Rehab OT Goals Patient Stated Goal: decrease pain, be able to safely navigate steps up to motel OT Goal Formulation: With patient Time For Goal Achievement: 06/20/20 Potential to Achieve Goals: Good ADL Goals Pt Will Perform Grooming: standing;with modified independence Pt Will Perform Lower Body Bathing: with set-up;sitting/lateral leans;sit to/from stand Pt Will Perform Lower Body Dressing: with set-up;sitting/lateral leans;sit to/from stand Pt Will Transfer to Toilet: with modified independence;ambulating Additional ADL Goal #1: Pt to demonstrate implementation of at least 2 compensatory strategies for pain mgmt during daily tasks  OT Frequency: Min 2X/week   Barriers to D/C:            Co-evaluation              AM-PAC OT "6 Clicks" Daily Activity     Outcome Measure Help from another person eating meals?: Total (NPO) Help from another person taking care of personal grooming?: A Little Help from another person toileting, which includes using toliet, bedpan, or urinal?: A Lot Help from another person bathing (including washing, rinsing, drying)?: A Lot Help from another person to put on and taking off regular upper body clothing?: A Little Help from another person  to put on and taking off regular lower body clothing?: A Lot 6 Click Score: 13   End of Session Nurse Communication: Mobility status  Activity Tolerance: Patient limited by pain Patient left: in bed;with call bell/phone within reach  OT Visit Diagnosis: Unsteadiness on feet (R26.81);Other abnormalities of gait and mobility (R26.89);Pain;Muscle weakness (generalized) (M62.81) Pain - Right/Left: Left Pain - part of body:  (abdomen)                Time:  6789-3810 OT Time Calculation (min): 20 min Charges:  OT General Charges $OT Visit: 1 Visit OT Evaluation $OT Eval Moderate Complexity: 1 Mod  Bradd Canary, OTR/L Acute Rehab Services Office: [phone]  Lorre Munroe 06/06/2020, 8:52 AM

## 2020-06-07 LAB — GLUCOSE, CAPILLARY
Glucose-Capillary: 99 mg/dL (ref 70–99)
Glucose-Capillary: 94 mg/dL (ref 70–99)
Glucose-Capillary: 99 mg/dL (ref 70–99)
Glucose-Capillary: 82 mg/dL (ref 70–99)

## 2020-06-07 LAB — BASIC METABOLIC PANEL
Anion gap: 7 (ref 5–15)
BUN: 7 mg/dL (ref 6–20)
CO2: 25 mmol/L (ref 22–32)
Calcium: 8.3 mg/dL — ABNORMAL LOW (ref 8.9–10.3)
Chloride: 99 mmol/L (ref 98–111)
Creatinine, Ser: 0.43 mg/dL — ABNORMAL LOW (ref 0.61–1.24)
GFR, Estimated: >60 mL/min (ref >60)
Glucose, Bld: 110 mg/dL — ABNORMAL HIGH (ref 70–99)
Potassium: 4 mmol/L (ref 3.5–5.1)
Sodium: 131 mmol/L — ABNORMAL LOW (ref 135–145)

## 2020-06-07 LAB — PHOSPHORUS: Phosphorus: 3.5 mg/dL (ref 2.5–4.6)

## 2020-06-07 LAB — MAGNESIUM: Magnesium: 1.9 mg/dL (ref 1.7–2.4)

## 2020-06-07 MED ORDER — ADULT MULTIVITAMIN LIQUID CH
15.0000 mL | Freq: Every day | ORAL | Status: DC
  Administered 2020-06-07 – 2020-06-14 (×7): 15 mL
  Filled 2020-06-07 (×7): qty 15

## 2020-06-07 MED ORDER — HYDROCODONE-ACETAMINOPHEN 7.5-325 MG/15ML PO SOLN
7.5000 mL | Freq: Four times a day (QID) | ORAL | Status: DC | PRN
Start: 2020-06-07 — End: 2020-06-09
  Administered 2020-06-07 – 2020-06-09 (×7): 7.5 mL
  Filled 2020-06-07 (×8): qty 15

## 2020-06-07 MED ORDER — POTASSIUM & SODIUM PHOSPHATES 280-160-250 MG PO PACK
1.0000 | PACK | Freq: Three times a day (TID) | ORAL | Status: DC
  Administered 2020-06-07 – 2020-06-08 (×3): 1
  Filled 2020-06-07 (×5): qty 1

## 2020-06-07 MED ORDER — GERHARDT'S BUTT CREAM
TOPICAL_CREAM | Freq: Every day | CUTANEOUS | Status: DC
  Filled 2020-06-07: qty 1

## 2020-06-07 NOTE — Progress Notes (Addendum)
Family Medicine Teaching Service Daily Progress Note Intern Pager: [phone]  Patient name: Ralph [name] Medical record number: [id number] Date of birth: 1965/11/03 Age: 55 y.o. Gender: male  Primary Care Provider: Moses Manners, MD Consultants: GI, CT surgery, Gen surgery Code Status: Full   Pt Overview and Major Events to Date:  4/18 admitted 4/22 EGD with findings of significant esophageal stricture not amenable to dilation 4/26 EGD, unsuccessful NG tube placement, laparoscopic jejunostomy  Assessment and Plan: Ralph [name] a 54 y.o.malepresenting with dysphagia and weight loss secondary to severe esophageal stricture. PMH is significant forgastritis and esophageal stricture, CVA, tobacco use, HTN, HLD.  Esophageal obstruction  S/p J-tube placement 4/26. Painadequately controlled.Tentative plan for esophagectomy in 2 weeks. -GI and cardiothoracic surgery following, appreciate involvement -scheduled Tylenol 650 mg q 6  -scheduled hydrocodone-Acetaminophen 7.5-325 mg 7.65mL q6h  -d/c D5NS 50 ml/hr -protonix 40 mg daily per tube -carafate 1g daily per tube -monitor Mg and P for refeeding syndrome, replete prn -ensure patient has GI follow-up prior to discharge -TOC for dispo: potentially home with [facility] or SNF placement though without insurance   Pneumonia Most recent CXR notable for volume loss in the left hemothorax with patchy airspace involving left lower lung concerning for possible pneumonia.  Patient doing well and asymptomatic. Leukocytosis resolved. Both blood and urine cultures negative.   - PO doxycycline (4/27-4/30) - PO amoxicillin-clavulanate (4/27-4/29) - s/p IV ampicillin-sulbactam (4/24-4/27) - s/p IV vancomycin (4/25-4/26)  COVID-19 Admitted with mild symptoms, overall asymptomatic.  - s/p remdesivir x 3 days -10 day quarantine period completed, COVID precautions removed  Protein calorie malnutrition With cachexia and weight loss in the setting  of poor oral intake secondary to esophageal stricture. -tube feeds per RD -nutrition recs: 70 mL/hr to provide 2016 kcal daily. -Mg 1.9 and Phos 3.8 this morning. Continue to monitor Mg and Phos for refeeding syndrome   Hypokalemia  K 3.7 this morning. -Continue Phos-Nak as ordered -monitor BMP  HTN Normotensive BP at 115/73. Not on any home anti-hypertensive therapy. -monitor BP   HLD Prescribed atorvastatin although not taking it as home med prior to this hospitalization.  - d/c statin due to concern of clogging tube   CVA Patient has history of stroke in 2018 with residual left-sided weakness. Home medications include atorvastatinand ASA, though appears patient is not taking these. -defer to PCP regarding patient to start taking aspirin   Tobacco use disorder Patient reports smoking 1PPD since age of 64, recently cut back to 1/2PPD.  -nicotine patch -consider restarting bupropion    Alcoholuse Reported drinking 1-2 40 oz beers per night. Last drink 4/18.Denies history of alcohol withdrawal. -thiamine  MDD Previously prescribed bupropion but is not taking at home. -consider restarting bupropion outpatient  FEN/GI: J-tube in place PPx: LMWH   Status is: Inpatient  Remains inpatient appropriate because:Inpatient level of care appropriate due to severity of illness   Dispo: The patient is from: Home  Anticipated d/c is to: SNF  Patient currently is  medically stable to d/c.              Difficult to place patient No   Subjective:  No acute events overnight. Patient states that his tube has been leaking since yesterday afternoon when he got up to walk. Advised patient that I will reach out to surgery to fix his drain.Additionally, endorsed left axillary pain when I placed a stethoscope in that area. Denied any other complaints    Objective: Temp:  [98.5  F (36.9 C)-99 F (37.2 C)] 98.6 F (37 C) (05/03 0438) Pulse  Rate:  [58-64] 58 (05/03 0438) Resp:  [16-18] 18 (05/03 0438) BP: (100-122)/(69-75) 115/73 (05/03 0438) SpO2:  [92 %-95 %] 93 % (05/03 0438) Weight:  [56.9 kg] 56.9 kg (05/03 0500) Physical Exam: General: alert, sitting in bed, NAD Cardiovascular: RRR no murmurs Respiratory: CTAB normal WOB Abdomen: soft, non distended. J tube with yellow fluid drainage that is covered with a dressing.  Extremities: No LE edema   Laboratory: Recent Labs  Lab 06/03/20 0325 06/04/20 0052 06/05/20 0054  WBC 8.5 10.1 8.6  HGB 12.5* 12.0* 11.2*  HCT 35.8* 34.6* 33.3*  PLT 207 181 208   Recent Labs  Lab 06/05/20 0054 06/06/20 0039 06/07/20 0253  NA 133* 134* 131*  K 3.5 3.7 4.0  CL 101 100 99  CO2 24 27 25   BUN 5* 5* 7  CREATININE 0.40* 0.46* 0.43*  CALCIUM 7.8* 8.2* 8.3*  GLUCOSE 168* 105* 110*     Imaging/Diagnostic Tests: No results found.  , DO 06/07/2020, 9:36 AM PGY-1, [place] [facility] FPTS Intern pager: [phone], text pages welcome

## 2020-06-07 NOTE — Progress Notes (Signed)
     [address]       [place] [address]             [phone]       No events. Tolerating tube feeds without any abdominal pain.  Tube feeds are at goal. We will continue to optimize nutrition. Will recheck a prealbumin later this week if disposition has not been established.   Ralph [name]

## 2020-06-07 NOTE — Progress Notes (Signed)
Nutrition Follow-up  DOCUMENTATION CODES:   Severe malnutrition in context of chronic illness,Underweight  INTERVENTION:   Continue tube feeding via J-tube: Jevity 1.2 at 70 ml/h to provide 2016 kcal, 93 gm protein, 1361 ml free water daily.  NUTRITION DIAGNOSIS:   Severe Malnutrition related to chronic illness (esophageal stricture) as evidenced by severe muscle depletion,severe fat depletion.  Ongoing  GOAL:   Patient will meet greater than or equal to 90% of their needs   Progressing  MONITOR:   PO intake,Supplement acceptance  REASON FOR ASSESSMENT:   Consult Assessment of nutrition requirement/status  ASSESSMENT:   55 yo male admitted with dysphagia. PMH includes esophagitis s/p dilation, alcohol abuse, CAD, dysphagia, CVA, MI, HTN, HLD.   Remains NPO. J tube in place. Receiving Jevity 1.2 at 70 ml/h. Refeeding labs (phos, mag, K) are all WNL. Patient reports some leaking around J tube site, surgery to evaluate. He states he is having no abdominal pain and tolerating tube feeding well.    Labs reviewed. Na 131 (L), K 4 WNL, phos 3.5 WNL, mag 1.9 WNL  Medications reviewed and include liquid MVI, Protonix, Phos-Nak, Senokot, Carafate.  Weight 56.9 kg  Admission weight 54.9 kg    Diet Order:   Diet Order            Diet NPO time specified  Diet effective midnight                 EDUCATION NEEDS:   Education needs have been addressed  Skin:  Skin Assessment: Reviewed RN Assessment  Last BM:  5/2  Height:   Ht Readings from Last 1 Encounters:  05/24/20 5\' 9"  (1.753 m)    Weight:   Wt Readings from Last 1 Encounters:  06/07/20 56.9 kg    BMI:  Body mass index is 18.52 kg/m.  Estimated Nutritional Needs:   Kcal:  1900-2100  Protein:  85-100 gm  Fluid:  >/= 1.8 L    08/07/20, RD, LDN, CNSC Please refer to Amion for contact information.

## 2020-06-07 NOTE — TOC Progression Note (Addendum)
Transition of Care [facility]) - Progression Note    Patient Details  Name: Ralph [name] MRN: [id number] Date of Birth: 13-Sep-1965  Transition of [facility]) CM/SW Contact  Mearl Latin, LCSW Phone Number: 06/07/2020, 9:11 AM  Clinical Narrative:    9:11am-CSW spoke with Paulette Blanch. She is reviewing patient for Letter of Guarantee bed.  3pm-[facility] denied patient. No other bed offers exist.    Expected Discharge Plan: Home/Self Care Barriers to Discharge: Continued Medical Work up  Expected Discharge Plan and Services Expected Discharge Plan: Home/Self Care   Discharge Planning Services: CM Consult   Living arrangements for the past 2 months: Single Family Home                           HH Arranged: PT,RN,Speech Therapy HH Agency: [facility] Date [facility] Agency Contacted: 06/04/20 Time HH Agency Contacted: [phone] Representative spoke with at [facility] Agency: [name]   Social Determinants of Health (SDOH) Interventions    Readmission Risk Interventions No flowsheet data found.

## 2020-06-07 NOTE — TOC Progression Note (Addendum)
Transition of Care [facility]) - Progression Note    Patient Details  Name: Ralph [name] MRN: [id number] Date of Birth: 1965-02-17  Transition of Care [facility]) CM/SW Contact  [name], RN Phone Number: 06/07/2020, 1:51 PM  Clinical Narrative:    Ralph [name] w patient's wife over the phone to clarify "homelessness."  She states that they pay weekly to stay at hotel. She works 7 days a week to be able to pay for it. They ARE NOT homeless.  She states that she feels like it is unsafe for him to be there. Patient is walking 80 feet with PT, they working on stamina, and identify that he will need to be able to ambulate stairs. Patient will also need to manage tube feeds, since he would be alone most of the day. Requested nursing staff to teach patient tube feedings.  Explained to wife that we can try to find placement, but may not be able to without insurance coverage/ payor source. Will discuss w TOC leadership if patient can be included in pilot program for intensive inpatient PT on unit.   She provided Ralph [name] at [facility] Disability office as contact for his pending medicaid [phone]. LVM requesting callnack. LVM with Ralph [name] [phone] with [facility] financial counselors. Will also email her Ralph [name]'s contact info    Ralph [name],Ralph [name] Significant other [phone]  [phone]     [facility]- needs assigned to charity closer to DC Tube Feeds- [employer] following (patient will need teaching prior to DC)  Expected Discharge Plan: Home/Self Care Barriers to Discharge: Continued Medical Work up  Expected Discharge Plan and Services Expected Discharge Plan: Home/Self Care   Discharge Planning Services: CM Consult   Living arrangements for the past 2 months: Single Family Home                           HH Arranged: PT,RN,Speech Therapy HH Agency: [facility] Date [facility] Agency Contacted: 06/04/20 Time HH Agency Contacted: [phone] Representative spoke with at [facility] Agency:  Ralph [name]   Social Determinants of Health (SDOH) Interventions    Readmission Risk Interventions No flowsheet data found.

## 2020-06-07 NOTE — Plan of Care (Signed)
VSS. NPO, tube feedings infusing. Given PRN pain meds. No other c/o overnight. Call bell within reach. Bed alarm on.   Problem: Education: Goal: Knowledge of General Education information will improve Description: Including pain rating scale, medication(s)/side effects and non-pharmacologic comfort measures Outcome: Progressing   Problem: Health Behavior/Discharge Planning: Goal: Ability to manage health-related needs will improve Outcome: Progressing   Problem: Clinical Measurements: Goal: Diagnostic test results will improve Outcome: Progressing   Problem: Nutrition: Goal: Adequate nutrition will be maintained Outcome: Progressing   Problem: Pain Managment: Goal: General experience of comfort will improve Outcome: Progressing

## 2020-06-08 LAB — BASIC METABOLIC PANEL
Anion gap: 9 (ref 5–15)
BUN: 10 mg/dL (ref 6–20)
CO2: 25 mmol/L (ref 22–32)
Calcium: 8.7 mg/dL — ABNORMAL LOW (ref 8.9–10.3)
Chloride: 96 mmol/L — ABNORMAL LOW (ref 98–111)
Creatinine, Ser: 0.47 mg/dL — ABNORMAL LOW (ref 0.61–1.24)
GFR, Estimated: >60 mL/min (ref >60)
Glucose, Bld: 93 mg/dL (ref 70–99)
Potassium: 4.8 mmol/L (ref 3.5–5.1)
Sodium: 130 mmol/L — ABNORMAL LOW (ref 135–145)

## 2020-06-08 LAB — GLUCOSE, CAPILLARY
Glucose-Capillary: 100 mg/dL — ABNORMAL HIGH (ref 70–99)
Glucose-Capillary: 100 mg/dL — ABNORMAL HIGH (ref 70–99)
Glucose-Capillary: 104 mg/dL — ABNORMAL HIGH (ref 70–99)
Glucose-Capillary: 120 mg/dL — ABNORMAL HIGH (ref 70–99)
Glucose-Capillary: 86 mg/dL (ref 70–99)
Glucose-Capillary: 87 mg/dL (ref 70–99)
Glucose-Capillary: 95 mg/dL (ref 70–99)
Glucose-Capillary: 99 mg/dL (ref 70–99)

## 2020-06-08 LAB — PHOSPHORUS: Phosphorus: 4 mg/dL (ref 2.5–4.6)

## 2020-06-08 LAB — MAGNESIUM: Magnesium: 2 mg/dL (ref 1.7–2.4)

## 2020-06-08 MED ORDER — DICLOFENAC SODIUM 1 % EX GEL
2.0000 g | Freq: Every day | CUTANEOUS | Status: DC | PRN
  Administered 2020-06-08: 2 g via TOPICAL
  Filled 2020-06-08: qty 100

## 2020-06-08 NOTE — TOC Progression Note (Signed)
Transition of Care [facility]) - Progression Note    Patient Details  Name: ADANTE COURINGTON MRN: [id number] Date of Birth: 1965/09/06  Transition of Care [facility]) CM/SW Contact  Mearl Latin, LCSW Phone Number: 06/08/2020, 5:09 PM  Clinical Narrative:    CSW staffed case with [facility] Supervisor, Verdon Cummins, who reported patient does not qualify for a Letter of Guarantee currently and patient will need to maximize therapy and feeding tube teaching efforts in the hospital in order to discharge back to the hotel with his wife.    Expected Discharge Plan: Home/Self Care Barriers to Discharge: Continued Medical Work up  Expected Discharge Plan and Services Expected Discharge Plan: Home/Self Care   Discharge Planning Services: CM Consult   Living arrangements for the past 2 months: Single Family Home                           HH Arranged: PT,RN,Speech Therapy HH Agency: [facility] Date [facility] Agency Contacted: 06/04/20 Time HH Agency Contacted: [phone] Representative spoke with at [facility] Agency: [name]   Social Determinants of Health (SDOH) Interventions    Readmission Risk Interventions No flowsheet data found.

## 2020-06-08 NOTE — Progress Notes (Signed)
Family Medicine Teaching Service Daily Progress Note Intern Pager: [phone]  Patient name: Ralph [name] Medical record number: [id number] Date of birth: 04-26-1965 Age: 55 y.o. Gender: male  Primary Care Provider: Moses Manners, MD Consultants: GI, CT surgery, Gen surgery Code Status: Full   Pt Overview and Major Events to Date:  4/18 admitted 4/22 EGD with findings of significant esophageal stricture not amenable to dilation 4/26 EGD, unsuccessful NG tube placement, laparoscopic jejunostomy  Assessment and Plan: Ralph Conroy Irbyis a 54 y.o.malepresenting with dysphagia and weight loss secondary to severe esophageal stricture. PMH is significant forgastritis and esophageal stricture, CVA, tobacco use, HTN, HLD.  Esophageal obstruction  S/p J-tube placement 4/26. Painadequately controlled- will continue to wean down on pain meds.Tentative plan for esophagectomy in 2 weeks. -GI and cardiothoracic surgery following, appreciate involvement -scheduled Tylenol 650 mg q 6  - decreased scheduled hydrocodone-Acetaminophen 7.5-325 mg 49mL q6h  -protonix 40 mg daily per tube -carafate 1g daily per tube -monitor Mg and P for refeeding syndrome, replete prn -TOC for dispo: potentially home with HH or SNF placement though without insurance   Protein calorie malnutrition With cachexia and weight loss in the setting of poor oral intake secondary to esophageal stricture. -tube feeds per RD -nutrition recs: 70 mL/hr to provide 2016 kcal daily. -Mg 2.0 and Phos 4.0 Continue to monitor Mg and Phos for refeeding syndrome.   Hypokalemia  K 4.8 -Continue Phos-Nak as ordered -monitor BMP  C/f Pneumonia, resolved  Most recent CXR notable for volume loss in the left hemothorax with patchy airspace involving left lower lung concerning for possible pneumonia.  Patient doing well and asymptomatic. Leukocytosis resolved. Both blood and urine cultures negative.   - PO doxycycline (4/27-4/30) - PO  amoxicillin-clavulanate (4/27-4/29) - s/p IV ampicillin-sulbactam (4/24-4/27) - s/p IV vancomycin (4/25-4/26)  HTN BP 104/72 this morning. Not on any home anti-hypertensive therapy. -monitor BP   HLD Prescribed atorvastatin although not taking it as home med prior to this hospitalization.  - d/c statin due to concern of clogging tube   CVA Patient has history of stroke in 2018 with residual left-sided weakness. Home medications include atorvastatinand ASA, though appears patient is not taking these. -defer to PCP regarding patient to start taking aspirin   COVID-19 Admitted with mild symptoms, overall asymptomatic.  - s/p remdesivir x 3 days -10 day quarantine period completed, COVID precautions removed  Tobacco use disorder Patient reports smoking 1PPD since age of 53, recently cut back to 1/2PPD.  -nicotine patch -consider restarting bupropion    Alcoholuse Reported drinking 1-2 40 oz beers per night. Last drink 4/18.Denies history of alcohol withdrawal. -thiamine  MDD Previously prescribed bupropion but is not taking at home. -consider restarting bupropion outpatient  FEN/GI: J-tube in place PPx: LMWH  Status is: Inpatient  Remains inpatient appropriate because:Inpatient level of care appropriate due to severity of illness   Dispo: The patient is from: Home  Anticipated d/c is to: SNF  Patient currently is  medically stable to d/c.              Difficult to place patient No   Subjective:  No acute events overnight. States wife is at the hotel but is working. Does endorse he could go back to hotel but does not have someone to help him   Objective: Temp:  [98.5 F (36.9 C)-98.7 F (37.1 C)] 98.7 F (37.1 C) (05/04 2122) Pulse Rate:  [56-61] 61 (05/04 2122) Resp:  [16-18] 18 (05/04 2122) BP: (  102-120)/(69-71) 120/71 (05/04 2122) SpO2:  [91 %-93 %] 92 % (05/04 2122) Physical Exam: General: alert,  NAD Cardiovascular: RRR no murmurs Respiratory: CTAB normal WOB Abdomen: soft, non distended. J tube surrounded with dressing with minimal light yellow drainage Extremities: warm, dry No LE edema   Laboratory: Recent Labs  Lab 06/03/20 0325 06/04/20 0052 06/05/20 0054  WBC 8.5 10.1 8.6  HGB 12.5* 12.0* 11.2*  HCT 35.8* 34.6* 33.3*  PLT 207 181 208   Recent Labs  Lab 06/06/20 0039 06/07/20 0253 06/08/20 1102  NA 134* 131* 130*  K 3.7 4.0 4.8  CL 100 99 96*  CO2 27 25 25   BUN 5* 7 10  CREATININE 0.46* 0.43* 0.47*  CALCIUM 8.2* 8.3* 8.7*  GLUCOSE 105* 110* 93     Imaging/Diagnostic Tests: No results found.  , DO 06/08/2020, 9:51 PM PGY-1, [place] [facility] FPTS Intern pager: [phone], text pages welcome

## 2020-06-08 NOTE — Progress Notes (Signed)
Family Medicine Teaching Service Daily Progress Note Intern Pager: [phone]  Patient name: Ralph [name] Medical record number: [id number] Date of birth: 10-04-1965 Age: 55 y.o. Gender: male  Primary Care Provider: Moses Manners, MD Consultants: GI, CT surgery, Gen surgery Code Status: Full   Pt Overview and Major Events to Date:  4/18 admitted 4/22 EGD with findings of significant esophageal stricture not amenable to dilation 4/26 EGD, unsuccessful NG tube placement, laparoscopic jejunostomy  Assessment and Plan: Ralph Pember Irbyis a 55 y.o.malepresenting with dysphagia and weight loss secondary to severe esophageal stricture. PMH is significant forgastritis and esophageal stricture, CVA, tobacco use, HTN, HLD.  Esophageal obstruction  S/p J-tube placement 4/26. Painadequately controlled.Tentative plan for esophagectomy in 2 weeks. -GI and cardiothoracic surgery following, appreciate involvement -scheduled Tylenol 650 mg q 6  -scheduled hydrocodone-Acetaminophen 7.5-325 mg 7.51mL q6h  -protonix 40 mg daily per tube -carafate 1g daily per tube -monitor Mg and P for refeeding syndrome, replete prn -TOC for dispo: potentially home with HH or SNF placement though without insurance   Pneumonia Most recent CXR notable for volume loss in the left hemothorax with patchy airspace involving left lower lung concerning for possible pneumonia.  Patient doing well and asymptomatic. Leukocytosis resolved. Both blood and urine cultures negative.   - PO doxycycline (4/27-4/30) - PO amoxicillin-clavulanate (4/27-4/29) - s/p IV ampicillin-sulbactam (4/24-4/27) - s/p IV vancomycin (4/25-4/26)  COVID-19 Admitted with mild symptoms, overall asymptomatic.  - s/p remdesivir x 3 days -10 day quarantine period completed, COVID precautions removed  Protein calorie malnutrition With cachexia and weight loss in the setting of poor oral intake secondary to esophageal stricture. -tube feeds per  RD -nutrition recs: 70 mL/hr to provide 2016 kcal daily. -Mg 1.9 and Phos 3.5 yesterday. Continue to monitor Mg and Phos for refeeding syndrome. Will check this morning   Hypokalemia  K 3.7 yesterday  -Continue Phos-Nak as ordered -monitor BMP  HTN Soft BP at 102/69 this morning. Not on any home anti-hypertensive therapy. -monitor BP   HLD Prescribed atorvastatin although not taking it as home med prior to this hospitalization.  - d/c statin due to concern of clogging tube   CVA Patient has history of stroke in 2018 with residual left-sided weakness. Home medications include atorvastatinand ASA, though appears patient is not taking these. -defer to PCP regarding patient to start taking aspirin   Tobacco use disorder Patient reports smoking 1PPD since age of 76, recently cut back to 1/2PPD.  -nicotine patch -consider restarting bupropion    Alcoholuse Reported drinking 1-2 40 oz beers per night. Last drink 4/18.Denies history of alcohol withdrawal. -thiamine  MDD Previously prescribed bupropion but is not taking at home. -consider restarting bupropion outpatient  FEN/GI: J-tube in place PPx: LMWH  Status is: Inpatient  Remains inpatient appropriate because:Inpatient level of care appropriate due to severity of illness   Dispo: The patient is from: Home  Anticipated d/c is to: SNF  Patient currently is  medically stable to d/c.              Difficult to place patient No   Subjective:  No acute events overnight. Still having some pain near j tube insertion. Concerned about leakage but let him know I talked to surgery and they said the leakage is common and to just keep it covered with dressing.   Objective: Temp:  [98.2 F (36.8 C)-98.5 F (36.9 C)] 98.5 F (36.9 C) (05/04 0431) Pulse Rate:  [55-65] 60 (05/04 0431) Resp:  [  17-20] 17 (05/04 0431) BP: (101-113)/(69-81) 102/69 (05/04 0431) SpO2:  [91 %-94 %] 91 % (05/04  0431) Physical Exam: General: alert, laying in bed Cardiovascular: RRR no murmurs Respiratory:  CTAB normal WOB Abdomen: soft, non distended. J tube surrounded with wound dressing, not saturated with fluid  Extremities: warm, dry. No LE edema   Laboratory: Recent Labs  Lab 06/03/20 0325 06/04/20 0052 06/05/20 0054  WBC 8.5 10.1 8.6  HGB 12.5* 12.0* 11.2*  HCT 35.8* 34.6* 33.3*  PLT 207 181 208   Recent Labs  Lab 06/05/20 0054 06/06/20 0039 06/07/20 0253  NA 133* 134* 131*  K 3.5 3.7 4.0  CL 101 100 99  CO2 24 27 25   BUN 5* 5* 7  CREATININE 0.40* 0.46* 0.43*  CALCIUM 7.8* 8.2* 8.3*  GLUCOSE 168* 105* 110*    Imaging/Diagnostic Tests: None new  , DO 06/08/2020, 9:27 AM PGY-1, [facility] FPTS Intern pager: [phone], text pages welcome

## 2020-06-08 NOTE — Progress Notes (Signed)
Occupational Therapy Treatment Patient Details Name: Ralph [name] MRN: [id number] DOB: Apr 10, 1965 Today's Date: 06/08/2020    History of present illness Ralph [name] is a 55 y/o male who reported to ED with complaints of worsening dysphagia, weight loss, and dehydration. Pt admitted on 05/23/20 with dysphagia and incidental covid-19. EGD on 4/22 showed esophageal stricture. EGD again on 4/26 for laparoscopic jejunostomy s/p j-tube. PMH includes esophagitis, esophageal dysphagia, CVA with residual L weakness, MI, back pain, essential HTN, alcohol use, and tobacco use.   OT comments  Pt progressing towards OT goals. Pt continues to be limited by pain but with improved functional performance in comparison to previous OT session. Pt able to demo mobility in room with RW at Supervision level to simulate distance from bed to bathroom in motel room. Pt Setup for oral care tasks using swab. Pt with difficulty managing LB dressing tasks but with problem solving adaptations - pt able to better bring feet to self bed level with lessened pain. Educated pt on using this strategy at home and wearing clothing that is easier to manage. Encouraged frequent mobility once discharged and bed positioning tips.    Follow Up Recommendations  Home health OT;Supervision - Intermittent;Other (comment) (consider postacute rehab if slow progress & continues to be pain limited)    Equipment Recommendations  3 in 1 bedside commode;Other (comment) (Rolling walker)    Recommendations for Other Services      Precautions / Restrictions Precautions Precautions: Fall Precaution Comments: j-tube Restrictions Weight Bearing Restrictions: No       Mobility Bed Mobility Overal bed mobility: Modified Independent             General bed mobility comments: No physical assist required, HOB slightly elevated    Transfers Overall transfer level: Needs assistance Equipment used: Rolling walker (2 wheeled) Transfers: Sit to/from  Stand Sit to Stand: Supervision         General transfer comment: able to stand quickly and without assist, requires reinforcement of hand placement    Balance Overall balance assessment: Needs assistance Sitting-balance support: Feet supported Sitting balance-Leahy Scale: Fair     Standing balance support: Bilateral upper extremity supported Standing balance-Leahy Scale: Poor Standing balance comment: walker and supervision for static standing                           ADL either performed or assessed with clinical judgement   ADL Overall ADL's : Needs assistance/impaired     Grooming: Set up;Sitting;Oral care Grooming Details (indicate cue type and reason): Setup for oral care with swab/mouth wash.             Lower Body Dressing: Moderate assistance;Sit to/from stand Lower Body Dressing Details (indicate cue type and reason): Pt unable to bring feet to self sitting EOB to don socks, so OT assisted. At end of session, assessed ability to reach B feet in bed and pt able to more easily bring feet to self. Educated on easier clothing to manage/don at home (pajamas, gym shorts, slide on shoes, etc)             Functional mobility during ADLs: Supervision/safety;Rolling walker General ADL Comments: Pt continues to be limited by pain but improved from previous session. Collaborated on energy conservation, mgmt of stairs as largest barrier (minimize need to navigate stairs as possible)     Vision   Vision Assessment?: No apparent visual deficits   Perception     Praxis  Cognition Arousal/Alertness: Awake/alert Behavior During Therapy: Flat affect Overall Cognitive Status: Within Functional Limits for tasks assessed                                 General Comments: A&Ox4. Responds appropriate to questions. Speaks in soft tones        Exercises     Shoulder Instructions       General Comments SpO2 93% and above on RA     Pertinent Vitals/ Pain       Pain Assessment: Faces Faces Pain Scale: Hurts even more Pain Location: L side near incision Pain Descriptors / Indicators: Constant;Grimacing;Guarding;Sharp Pain Intervention(s): Monitored during session;Limited activity within patient's tolerance;Repositioned  Home Living                                          Prior Functioning/Environment              Frequency  Min 2X/week        Progress Toward Goals  OT Goals(current goals can now be found in the care plan section)  Progress towards OT goals: Progressing toward goals  Acute Rehab OT Goals Patient Stated Goal: decrease pain, be able to safely navigate steps up to motel OT Goal Formulation: With patient Time For Goal Achievement: 06/20/20 Potential to Achieve Goals: Good ADL Goals Pt Will Perform Grooming: standing;with modified independence Pt Will Perform Lower Body Bathing: with set-up;sitting/lateral leans;sit to/from stand Pt Will Perform Lower Body Dressing: with set-up;sitting/lateral leans;sit to/from stand Pt Will Transfer to Toilet: with modified independence;ambulating Additional ADL Goal #1: Pt to demonstrate implementation of at least 2 compensatory strategies for pain mgmt during daily tasks  Plan Discharge plan remains appropriate    Co-evaluation                 AM-PAC OT "6 Clicks" Daily Activity     Outcome Measure   Help from another person eating meals?: Total (NPO) Help from another person taking care of personal grooming?: A Little Help from another person toileting, which includes using toliet, bedpan, or urinal?: A Lot Help from another person bathing (including washing, rinsing, drying)?: A Lot Help from another person to put on and taking off regular upper body clothing?: A Little Help from another person to put on and taking off regular lower body clothing?: A Lot 6 Click Score: 13    End of Session Equipment Utilized  During Treatment: Rolling walker  OT Visit Diagnosis: Unsteadiness on feet (R26.81);Other abnormalities of gait and mobility (R26.89);Pain;Muscle weakness (generalized) (M62.81) Pain - Right/Left: Left Pain - part of body:  (abdomen)   Activity Tolerance Patient tolerated treatment well;Patient limited by pain   Patient Left in bed;with call bell/phone within reach;with bed alarm set   Nurse Communication Mobility status        Time: 0626-9485 OT Time Calculation (min): 22 min  Charges: OT General Charges $OT Visit: 1 Visit OT Treatments $Self Care/Home Management : 8-22 mins  Ralph [name], OTR/L Acute Rehab Services Office: [phone]   Ralph [name] 06/08/2020, 8:44 AM

## 2020-06-08 NOTE — Progress Notes (Signed)
Physical Therapy Treatment Patient Details Name: Ralph [name] MRN: [id number] DOB: 1965-06-08 Today's Date: 06/08/2020    History of Present Illness Ralph [name] is a 55 y/o male who reported to ED with complaints of worsening dysphagia, weight loss, and dehydration. Pt admitted on 05/23/20 with dysphagia and incidental covid-19. EGD on 4/22 showed esophageal stricture. EGD again on 4/26 for laparoscopic jejunostomy s/p j-tube. PMH includes esophagitis, esophageal dysphagia, CVA with residual L weakness, MI, back pain, essential HTN, alcohol use, and tobacco use.    PT Comments    Pt with increased pain this afternoon limiting his ability to participate in OOB therapy. Discussed possibility of intensive therapy in order to facilitate discharge home to his hotel room which is up 15 steps. Pt reports he would work with therapy on the mobility, but that he is also very nervous about giving himself tube feeds. Focus of session education on stair climbing technique and major muscle groups used for safe ascent/descent, followed up with bed exercises utilizing those muscle groups. Pt experiencing increased pain with movement, so also educated on deep breathing and relaxation. Requested pt work on 3 count breathing throughout the day. PT will follow back tomorrow approximately 1 hour after pain medication is given to optimize effect.    Follow Up Recommendations  Supervision for mobility/OOB;Home health PT     Equipment Recommendations  Rolling walker with 5" wheels;3in1 (PT);Wheelchair (measurements PT);Wheelchair cushion (measurements PT)    Recommendations for Other Services       Precautions / Restrictions Precautions Precautions: Fall Precaution Comments: j-tube Restrictions Weight Bearing Restrictions: No          Cognition Arousal/Alertness: Awake/alert Behavior During Therapy: Flat affect Overall Cognitive Status: Within Functional Limits for tasks assessed                                  General Comments: voices understanding of need to particpate      Exercises General Exercises - Lower Extremity Quad Sets: AROM;Both;10 reps;Seated Gluteal Sets: AROM;Both;10 reps;Supine Heel Slides: AROM;Both;10 reps;Supine    General Comments  RN in room with medication and topical analgesic at end of session.        Pertinent Vitals/Pain Pain Assessment: 0-10 Pain Score: 8  Pain Location: L side near incision Pain Descriptors / Indicators: Constant;Grimacing;Guarding;Sharp Pain Intervention(s): Limited activity within patient's tolerance;Monitored during session;Repositioned;Utilized relaxation techniques;Other (comment) (taught rhythmic breathing)           PT Goals (current goals can now be found in the care plan section) Acute Rehab PT Goals Patient Stated Goal: decrease pain, be able to safely navigate steps up to motel PT Goal Formulation: With patient Time For Goal Achievement: 06/16/20 Potential to Achieve Goals: Good Progress towards PT goals: Not progressing toward goals - comment (limited by pain)    Frequency    Min 3X/week      PT Plan Current plan remains appropriate       AM-PAC PT "6 Clicks" Mobility   Outcome Measure  Help needed turning from your back to your side while in a flat bed without using bedrails?: None Help needed moving from lying on your back to sitting on the side of a flat bed without using bedrails?: None Help needed moving to and from a bed to a chair (including a wheelchair)?: A Little Help needed standing up from a chair using your arms (e.g., wheelchair or bedside chair)?: A Little Help  needed to walk in hospital room?: A Little Help needed climbing 3-5 steps with a railing? : A Little 6 Click Score: 20    End of Session   Activity Tolerance: Patient limited by pain Patient left: in bed;with call bell/phone within reach;with nursing/sitter in room Nurse Communication: Mobility status PT Visit Diagnosis:  Pain;Other abnormalities of gait and mobility (R26.89) Pain - Right/Left: Left Pain - part of body:  (flank)     Time: 3825-0539 PT Time Calculation (min) (ACUTE ONLY): 21 min  Charges:  $Therapeutic Exercise: 8-22 mins                     Mava Suares B. Beverely Risen PT, DPT Acute Rehabilitation Services Pager [phone] Office [phone]    Elon Alas Fleet 06/08/2020, 3:41 PM

## 2020-06-09 LAB — GLUCOSE, CAPILLARY
Glucose-Capillary: 103 mg/dL — ABNORMAL HIGH (ref 70–99)
Glucose-Capillary: 107 mg/dL — ABNORMAL HIGH (ref 70–99)
Glucose-Capillary: 110 mg/dL — ABNORMAL HIGH (ref 70–99)
Glucose-Capillary: 85 mg/dL (ref 70–99)
Glucose-Capillary: 97 mg/dL (ref 70–99)
Glucose-Capillary: 98 mg/dL (ref 70–99)

## 2020-06-09 MED ORDER — HYDROXYZINE HCL 10 MG/5ML PO SYRP
10.0000 mg | ORAL_SOLUTION | Freq: Once | ORAL | Status: AC
  Administered 2020-06-09: 10 mg
  Filled 2020-06-09: qty 5

## 2020-06-09 MED ORDER — HYDROCODONE-ACETAMINOPHEN 7.5-325 MG/15ML PO SOLN
5.0000 mL | Freq: Four times a day (QID) | ORAL | Status: DC | PRN
  Administered 2020-06-09 – 2020-06-14 (×19): 5 mL
  Filled 2020-06-09 (×19): qty 15

## 2020-06-09 NOTE — Progress Notes (Signed)
Patient assisted and performed medication administration through J-tube at this time.

## 2020-06-09 NOTE — Progress Notes (Signed)
Patient assisted and performed medication administration via J-tube at this time.

## 2020-06-09 NOTE — TOC Progression Note (Signed)
Transition of Care [facility]) - Progression Note    Patient Details  Name: DA AUTHEMENT MRN: [id number] Date of Birth: Mar 16, 1965  Transition of [facility]) CM/SW Contact  Lockie Pares, RN Phone Number: 06/09/2020, 3:27 PM  Clinical Narrative:     When ready to discharge please send medications to [facility] pharmacy for [facility]. If DC over this weekend send medications TOMORROW.   Expected Discharge Plan: Home/Self Care Barriers to Discharge: Continued Medical Work up  Expected Discharge Plan and Services Expected Discharge Plan: Home/Self Care   Discharge Planning Services: CM Consult   Living arrangements for the past 2 months: Single Family Home                           HH Arranged: PT,RN,Speech Therapy HH Agency: [facility] Date [facility] Agency Contacted: 06/04/20 Time HH Agency Contacted: [phone] Representative spoke with at [facility] Agency: [name]   Social Determinants of Health (SDOH) Interventions    Readmission Risk Interventions No flowsheet data found.

## 2020-06-09 NOTE — Progress Notes (Signed)
Occupational Therapy Treatment Patient Details Name: Ralph [name] MRN: [id number] DOB: 1966-02-01 Today's Date: 06/09/2020    History of present illness Ralph [name] is a 55 y/o male who reported to ED with complaints of worsening dysphagia, weight loss, and dehydration. Pt admitted on 05/23/20 with dysphagia and incidental covid-19. EGD on 4/22 showed esophageal stricture. EGD again on 4/26 for laparoscopic jejunostomy s/p j-tube. PMH includes esophagitis, esophageal dysphagia, CVA with residual L weakness, MI, back pain, essential HTN, alcohol use, and tobacco use.   OT comments  Pt seen for OT session this AM however pt limited by pain ( 7/10) in L flank. Session focus on assessment of determining pts overall rehad potential to partake in intensive acute rehab program where pt would be seen 5x a week for at ~ 45 min sessions to facilitate functional independence and return to home. Presented expectations to pt with pt very flat and distracted by pain. Pt reports his goal is to "get well." Pt agreeable to program but reports that he would prefer PT/OT to some 30 mins after he receives pain meds, explained to pt that rehab team would certainly try to meet these expectations but that some pain would be expected when participating in high frequency rehab program. Will continue to follow pt and attempt to see pt 5x a week ( pending pt participation) to facilitate safe DC home.   Follow Up Recommendations  Home health OT;Supervision - Intermittent;Other (comment) (consider postacute rehab if slow progress & continues to be pain limited)    Equipment Recommendations  3 in 1 bedside commode;Other (comment) (rolling walker)    Recommendations for Other Services      Precautions / Restrictions Precautions Precautions: Fall Precaution Comments: j-tube Restrictions Weight Bearing Restrictions: No       Mobility Bed Mobility                    Transfers                      Balance                                            ADL either performed or assessed with clinical judgement   ADL Overall ADL's : Needs assistance/impaired                                       General ADL Comments: pt declined OOB functional mobility or BADL particiaption d/t pain, session focus on discussion of expectations for intensive therapy in order to facilitate discharge home to his hotel room. Pt very flat and distracted by pain ( 7/10) but reports he is agreeable to program as pt states " I guess I have to."     Vision       Perception     Praxis      Cognition Arousal/Alertness: Awake/alert Behavior During Therapy: Flat affect Overall Cognitive Status: Within Functional Limits for tasks assessed                                 General Comments: very flat and soft spoken but seemed agreeable to increased freqeuncy program with acute rehab  Exercises     Shoulder Instructions       General Comments pt on RA with SpO2 >93% during session. pt reports his wife goes into work around 10 AM and doesn't return until 10 pm, pt does endorse that wife can assist him before work and after as needed. pt reports his wife takes their clothes to laundry mat but pt does agree that he needs to be able to complete simple IADLs such as light meal prep and household chores. pt has tub shower in motel and would need shower seat.    Pertinent Vitals/ Pain       Pain Assessment: 0-10 Pain Score: 7  Pain Location: L side near incision Pain Descriptors / Indicators: Grimacing;Guarding;Discomfort Pain Intervention(s): Limited activity within patient's tolerance;Monitored during session;Repositioned;Premedicated before session  Home Living                                          Prior Functioning/Environment              Frequency           Progress Toward Goals  OT Goals(current goals can now be found in the  care plan section)  Progress towards OT goals: Not progressing toward goals - comment (limited by pain this session)  Acute Rehab OT Goals Patient Stated Goal: to get well OT Goal Formulation: With patient Time For Goal Achievement: 06/20/20 Potential to Achieve Goals: Fair  Plan Discharge plan remains appropriate;Frequency remains appropriate    Co-evaluation                 AM-PAC OT "6 Clicks" Daily Activity     Outcome Measure   Help from another person eating meals?: Total (NPO) Help from another person taking care of personal grooming?: A Little Help from another person toileting, which includes using toliet, bedpan, or urinal?: A Lot Help from another person bathing (including washing, rinsing, drying)?: A Lot Help from another person to put on and taking off regular upper body clothing?: A Little Help from another person to put on and taking off regular lower body clothing?: A Lot 6 Click Score: 13    End of Session    OT Visit Diagnosis: Unsteadiness on feet (R26.81);Other abnormalities of gait and mobility (R26.89);Pain;Muscle weakness (generalized) (M62.81) Pain - Right/Left: Left Pain - part of body:  (flank)   Activity Tolerance Patient limited by pain   Patient Left in bed;with call bell/phone within reach   Nurse Communication Mobility status        Time: 5573-2202 OT Time Calculation (min): 11 min  Charges: OT General Charges $OT Visit: 1 Visit OT Treatments $Self Care/Home Management : 8-22 mins Lenor Derrick., COTA/L Acute Rehabilitation Services [phone]    Barron Schmid 06/09/2020, 8:23 AM

## 2020-06-09 NOTE — Progress Notes (Addendum)
Physical Therapy Treatment Patient Details Name: Ralph [name] MRN: [id number] DOB: [birth year] Today's Date: 06/09/2020    History of Present Illness Ralph [name] is a 55 y/o male who reported to ED with complaints of worsening dysphagia, weight loss, and dehydration. Pt admitted on 05/23/20 with dysphagia and incidental covid-19. EGD on 4/22 showed esophageal stricture. EGD again on 4/26 for laparoscopic jejunostomy s/p j-tube. PMH includes esophagitis, esophageal dysphagia, CVA with residual L weakness, MI, back pain, essential HTN, alcohol use, and tobacco use.    PT Comments    Pt upset on entry that he had not been given his pain medication in time to work with therapy. Pt also reports that he needs to use the bathroom. Pt agreeable to get up to [facility]. Pt supervision for bed mobility, min A for transfer with HHA to BSC, and min guard to transfer back with RW. Educated pt that his pain levels may not ever be at optimal level, however able to demonstrate that his pain levels did not elevate with mobility. Pt even able to stand for approx 3 min in RW without increase in pain. Worked on relaxation and deep breathing for pain management. PT will continue to work towards stair training for discharge back to his home.     Follow Up Recommendations  Supervision for mobility/OOB;Home health PT     Equipment Recommendations  Rolling walker with 5" wheels;3in1 (PT);Wheelchair (measurements PT);Wheelchair cushion (measurements PT)       Precautions / Restrictions Precautions Precautions: Fall Precaution Comments: j-tube Restrictions Weight Bearing Restrictions: No    Mobility  Bed Mobility Overal bed mobility: Needs Assistance Bed Mobility: Supine to Sit;Sit to Supine     Supine to sit: Supervision;HOB elevated Sit to supine: Supervision;HOB elevated   General bed mobility comments: No physical assist required, HOB slightly elevated    Transfers Overall transfer level: Needs  assistance Equipment used: 1 person hand held assist;Rolling walker (2 wheeled) Transfers: Sit to/from [employer] Sit to Stand: Min assist;Min guard Stand pivot transfers: Min assist;Min guard       General transfer comment: min A for sit>stand and pivot to BSC on R with HHA, and min guard for sit>stand and pivot back to bed        Balance Overall balance assessment: Needs assistance Sitting-balance support: Feet supported Sitting balance-Leahy Scale: Fair Sitting balance - Comments: able to sit at min guard level at EOB   Standing balance support: Bilateral upper extremity supported Standing balance-Leahy Scale: Poor Standing balance comment: walker or HHA required                            Cognition Arousal/Alertness: Awake/alert Behavior During Therapy: Flat affect Overall Cognitive Status: Within Functional Limits for tasks assessed                                 General Comments: voices understanding of need to particpate      Exercises      General Comments General comments (skin integrity, edema, etc.): VSS on RA, provided extensive education to fact that pain may not be able to be relieved to level he deems necessary to begin moving, able to demonstrate that although he experiences a slight increase in pain with transitions, his overall pain does not increse with mobility.      Pertinent Vitals/Pain Pain Assessment: 0-10 Pain Score: 8  Pain  Location: L side near incision Pain Descriptors / Indicators: Constant;Grimacing;Guarding;Sharp Pain Intervention(s): Limited activity within patient's tolerance;Monitored during session;Repositioned     PT Goals (current goals can now be found in the care plan section) Acute Rehab PT Goals Patient Stated Goal: decrease pain, be able to safely navigate steps up to motel PT Goal Formulation: With patient Time For Goal Achievement: 06/16/20 Potential to Achieve Goals:  Good Progress towards PT goals: Progressing toward goals    Frequency    Min 3X/week      PT Plan Current plan remains appropriate       AM-PAC PT "6 Clicks" Mobility   Outcome Measure  Help needed turning from your back to your side while in a flat bed without using bedrails?: None Help needed moving from lying on your back to sitting on the side of a flat bed without using bedrails?: None Help needed moving to and from a bed to a chair (including a wheelchair)?: A Little Help needed standing up from a chair using your arms (e.g., wheelchair or bedside chair)?: A Little Help needed to walk in hospital room?: A Little Help needed climbing 3-5 steps with a railing? : A Little 6 Click Score: 20    End of Session Equipment Utilized During Treatment: Gait belt Activity Tolerance: Patient limited by pain Patient left: in bed;with call bell/phone within reach;with nursing/sitter in room Nurse Communication: Mobility status PT Visit Diagnosis: Pain;Other abnormalities of gait and mobility (R26.89) Pain - Right/Left: Left Pain - part of body:  (flank)     Time: 3903-0092 PT Time Calculation (min) (ACUTE ONLY): 60 min  Charges:  $Therapeutic Exercise: 23-37 mins $Therapeutic Activity: 23-37 mins                     Calah Gershman B. Beverely Risen PT, DPT Acute Rehabilitation Services Pager [phone] Office [phone]    Ralph [name] 06/09/2020, 4:25 PM

## 2020-06-09 NOTE — Progress Notes (Addendum)
Family Medicine Teaching Service Daily Progress Note Intern Pager: [phone]  Patient name: Ralph [name] Medical record number: [id number] Date of birth: 04-09-1965 Age: 55 y.o. Gender: male  Primary Care Provider: Moses Manners, MD Consultants: GI, CT surgery, Gen surgery Code Status: Full  Pt Overview and Major Events to Date:  4/18 admitted 4/22 EGD with findings of significant esophageal stricture not amenable to dilation 4/26 EGD, unsuccessful NG tube placement, laparoscopic jejunostomy  Assessment and Plan: Ralph Butrick Irbyis a 55 y.o.malepresenting with dysphagia and weight loss secondary to severe esophageal stricture. PMH is significant forgastritis and esophageal stricture, CVA, tobacco use, HTN, HLD.  Esophageal obstruction S/p J-tube placement 4/26. Painadequately controlled- will continue to wean down on pain meds. CVTS to see patient today and check pre-albumin to see if able to do esophagectomy while here in the hospital. J tube also with yellow/green purulent leakage and redness surrounding site. Will see if surgery can take a look.  -GI and cardiothoracic surgery following, appreciate involvement -scheduled Tylenol 650 mg q 6  - decreased scheduled hydrocodone-Acetaminophen 7.5-325 mg 49mL q6h  -protonix 40 mg daily per tube -carafate 1g daily per tube -monitor Mg and P for refeeding syndrome, replete prn - pre albumin normal at 19 - check CBC - wound care consult  -TOC for dispo: potentially home (hotel) with [facility]   Protein calorie malnutrition With cachexia and weight loss in the setting of poor oral intake secondary to esophageal stricture. -tube feeds per RD -nutrition recs: 70 mL/hr to provide 2016 kcal daily. -Mg 2.0 and Phos 3.5 Continue to monitor Mg and Phos q M/W/F for refeeding syndrome.   Hypokalemia  K 4.7 -Continue Phos-Nak as ordered -monitor BMP (M/W/F)  C/f Pneumonia, resolved  Most recent CXR notable for volume loss in the left  hemothorax with patchy airspace involving left lower lung concerning for possible pneumonia. Patient doing well and asymptomatic. Leukocytosis resolved. Both blood and urine cultures negative.  - PO doxycycline (4/27-4/30) - PO amoxicillin-clavulanate (4/27-4/29) - s/p IV ampicillin-sulbactam (4/24-4/27) - s/p IV vancomycin (4/25-4/26)  HTN BP 121/77 this morning. Not on any home anti-hypertensive therapy. -monitor BP   HLD Prescribed atorvastatin although not taking it as home med prior to this hospitalization.  - d/c statin due to concern of clogging tube   CVA Patient has history of stroke in 2018 with residual left-sided weakness. Home medications include atorvastatinand ASA, though appears patient is not taking these. -defer to PCP regarding patient to start taking aspirin   COVID-19 Admitted with mild symptoms, overall asymptomatic.  - s/p remdesivir x 3 days -10 day quarantine period completed, COVID precautions removed  Tobacco use disorder Patient reports smoking 1PPD since age of 69, recently cut back to 1/2PPD.  -nicotine patch -consider restarting bupropion    Alcoholuse Reported drinking 1-2 40 oz beers per night. Last drink 4/18.Denies history of alcohol withdrawal. -thiamine  MDD Previously prescribed bupropion but is not taking at home. -consider restarting bupropion outpatient  FEN/GI: J-tube in place PPx: LMWH  Status is: Inpatient  Remains inpatient appropriate because:Inpatient level of care appropriate due to severity of illness   Dispo: The patient is from: Home  Anticipated d/c is to: SNF  Patient currently is medically stable to d/c. Difficult to place patient No  Subjective:  No acute events overnight. Patient sleeping when I came into the room, states he just received his pain medication 20 minutes prior. Feels the pain meds overall control his pain, but this morning endorses  more  pain from the j tube site.    Objective: Temp:  [98.6 F (37 C)-98.7 F (37.1 C)] 98.6 F (37 C) (05/05 0407) Pulse Rate:  [57-66] 63 (05/05 1632) Resp:  [15-18] 18 (05/05 1632) BP: (104-120)/(71-80) 117/77 (05/05 1632) SpO2:  [92 %-95 %] 95 % (05/05 1632) Weight:  [57 kg] 57 kg (05/05 0500) Physical Exam: General: sleeping, NAD Cardiovascular: RRR no murmurs Respiratory: CTAB normal WOB Abdomen: soft, non distended, tender around J tube site with erythema and yellow/green purulent leakage surrounding site. Extremities: warm, dry. No LE edema   Laboratory: Recent Labs  Lab 06/03/20 0325 06/04/20 0052 06/05/20 0054  WBC 8.5 10.1 8.6  HGB 12.5* 12.0* 11.2*  HCT 35.8* 34.6* 33.3*  PLT 207 181 208   Recent Labs  Lab 06/06/20 0039 06/07/20 0253 06/08/20 1102  NA 134* 131* 130*  K 3.7 4.0 4.8  CL 100 99 96*  CO2 27 25 25   BUN 5* 7 10  CREATININE 0.46* 0.43* 0.47*  CALCIUM 8.2* 8.3* 8.7*  GLUCOSE 105* 110* 93     Imaging/Diagnostic Tests: None new  , DO 06/09/2020, 4:45 PM PGY-1, [facility] Family Medicine FPTS Intern pager: [phone], text pages welcome

## 2020-06-10 ENCOUNTER — Other Ambulatory Visit (HOSPITAL_COMMUNITY): Payer: Self-pay

## 2020-06-10 LAB — BASIC METABOLIC PANEL
Anion gap: 9 (ref 5–15)
BUN: 12 mg/dL (ref 6–20)
CO2: 27 mmol/L (ref 22–32)
Calcium: 9.1 mg/dL (ref 8.9–10.3)
Chloride: 96 mmol/L — ABNORMAL LOW (ref 98–111)
Creatinine, Ser: 0.48 mg/dL — ABNORMAL LOW (ref 0.61–1.24)
GFR, Estimated: >60 mL/min (ref >60)
Glucose, Bld: 95 mg/dL (ref 70–99)
Potassium: 4.7 mmol/L (ref 3.5–5.1)
Sodium: 132 mmol/L — ABNORMAL LOW (ref 135–145)

## 2020-06-10 LAB — CBC
HCT: 39.6 % (ref 39.0–52.0)
Hemoglobin: 13.8 g/dL (ref 13.0–17.0)
MCH: 32.5 pg (ref 26.0–34.0)
MCHC: 34.8 g/dL (ref 30.0–36.0)
MCV: 93.2 fL (ref 80.0–100.0)
Platelets: 456 10*3/uL — ABNORMAL HIGH (ref 150–400)
RBC: 4.25 MIL/uL (ref 4.22–5.81)
RDW: 14.6 % (ref 11.5–15.5)
WBC: 16.6 10*3/uL — ABNORMAL HIGH (ref 4.0–10.5)
nRBC: 0 % (ref 0.0–0.2)

## 2020-06-10 LAB — PREALBUMIN: Prealbumin: 19 mg/dL (ref 18–38)

## 2020-06-10 LAB — GLUCOSE, CAPILLARY
Glucose-Capillary: 109 mg/dL — ABNORMAL HIGH (ref 70–99)
Glucose-Capillary: 107 mg/dL — ABNORMAL HIGH (ref 70–99)
Glucose-Capillary: 120 mg/dL — ABNORMAL HIGH (ref 70–99)
Glucose-Capillary: 99 mg/dL (ref 70–99)
Glucose-Capillary: 105 mg/dL — ABNORMAL HIGH (ref 70–99)

## 2020-06-10 LAB — PHOSPHORUS: Phosphorus: 3.5 mg/dL (ref 2.5–4.6)

## 2020-06-10 LAB — MAGNESIUM: Magnesium: 2 mg/dL (ref 1.7–2.4)

## 2020-06-10 MED ORDER — ATORVASTATIN CALCIUM 80 MG PO TABS
80.0000 mg | ORAL_TABLET | Freq: Every day | ORAL | 3 refills | Status: DC
  Filled 2020-06-10: qty 90, 90d supply, fill #0

## 2020-06-10 MED ORDER — ZINC OXIDE 12.8 % EX OINT
TOPICAL_OINTMENT | Freq: Four times a day (QID) | CUTANEOUS | 0 refills | Status: DC
  Filled 2020-06-10: qty 56.7, fill #0

## 2020-06-10 MED ORDER — SUCRALFATE 1 GM/10ML PO SUSP
1.0000 g | Freq: Three times a day (TID) | ORAL | 0 refills | Status: DC
  Filled 2020-06-10 – 2020-06-13 (×2): qty 420, 11d supply, fill #0

## 2020-06-10 MED ORDER — ASPIRIN 81 MG PO CHEW
81.0000 mg | CHEWABLE_TABLET | Freq: Every day | ORAL | 0 refills | Status: DC
  Filled 2020-06-10: qty 36, 36d supply, fill #0

## 2020-06-10 MED ORDER — GABAPENTIN 250 MG/5ML PO SOLN
300.0000 mg | Freq: Three times a day (TID) | ORAL | 12 refills | Status: DC
  Filled 2020-06-10 – 2020-06-13 (×2): qty 470, 26d supply, fill #0

## 2020-06-10 MED ORDER — DICLOFENAC SODIUM 1 % EX GEL
2.0000 g | Freq: Every day | CUTANEOUS | 0 refills | Status: DC | PRN
  Filled 2020-06-10: qty 150, fill #0
  Filled 2020-06-13: qty 100, 10d supply, fill #0

## 2020-06-10 MED ORDER — JEVITY 1.2 CAL PO LIQD
1000.0000 mL | ORAL | 0 refills | Status: DC
  Filled 2020-06-10: qty 1000, fill #0

## 2020-06-10 MED ORDER — MELATONIN 3 MG PO TABS
3.0000 mg | ORAL_TABLET | Freq: Every evening | ORAL | 0 refills | Status: DC | PRN
  Filled 2020-06-10: qty 30, 30d supply, fill #0

## 2020-06-10 MED ORDER — ZINC OXIDE 12.8 % EX OINT
TOPICAL_OINTMENT | Freq: Four times a day (QID) | CUTANEOUS | Status: DC
  Administered 2020-06-12 (×2): 1 via TOPICAL
  Filled 2020-06-10: qty 56.7

## 2020-06-10 MED ORDER — SENNOSIDES 8.8 MG/5ML PO SYRP
5.0000 mL | ORAL_SOLUTION | Freq: Every day | ORAL | 0 refills | Status: DC
  Filled 2020-06-10: qty 240, 48d supply, fill #0

## 2020-06-10 MED ORDER — ACETAMINOPHEN 160 MG/5ML PO SUSP
650.0000 mg | Freq: Four times a day (QID) | ORAL | 0 refills | Status: DC
  Filled 2020-06-10: qty 120, 2d supply, fill #0

## 2020-06-10 MED ORDER — ADULT MULTIVITAMIN LIQUID CH
15.0000 mL | Freq: Every day | ORAL | 0 refills | Status: DC
  Filled 2020-06-10: qty 178, 11d supply, fill #0

## 2020-06-10 MED ORDER — PANTOPRAZOLE SODIUM 40 MG PO PACK
40.0000 mg | PACK | Freq: Two times a day (BID) | ORAL | 0 refills | Status: DC
  Filled 2020-06-10: qty 30, 1d supply, fill #0
  Filled 2020-06-13: qty 1200, 30d supply, fill #0

## 2020-06-10 NOTE — Progress Notes (Signed)
Occupational Therapy Treatment Patient Details Name: Ralph [name] MRN: [id number] DOB: March 26, 1965 Today's Date: 06/10/2020    History of present illness Ralph [name] is a 55 y/o male who reported to ED with complaints of worsening dysphagia, weight loss, and dehydration. Pt admitted on 05/23/20 with dysphagia and incidental covid-19. EGD on 4/22 showed esophageal stricture. EGD again on 4/26 for laparoscopic jejunostomy s/p j-tube. PMH includes esophagitis, esophageal dysphagia, CVA with residual L weakness, MI, back pain, essential HTN, alcohol use, and tobacco use.   OT comments  Pt making steady progress towards OT goals this session. Pt continues to present with increased pain and decreased activity tolerance. Pt currently requires min guard assist for household distance functional mobility with pt holding onto IV pole for balance. Pt able to stand at sink for standing grooming tasks with min guard assist. Pt additionally able to sit EOB to use shampoo cap but needed MOD A for thoroughness as pt with difficulty using LUE  d/t weakness from previous CVA. Dc plan currently remains appropriate, will continue to follow acutely per POC.    Follow Up Recommendations  Home health OT;Supervision - Intermittent;Other (comment) (consider postacute rehab if slow progress & continues to be pain limited)    Equipment Recommendations  3 in 1 bedside commode;Other (comment) (rolling walker)    Recommendations for Other Services      Precautions / Restrictions Precautions Precautions: Fall Precaution Comments: j-tube Restrictions Weight Bearing Restrictions: No       Mobility Bed Mobility Overal bed mobility: Needs Assistance Bed Mobility: Supine to Sit;Sit to Supine     Supine to sit: Supervision;HOB elevated Sit to supine: Supervision;HOB elevated   General bed mobility comments: No physical assist required, HOB slightly elevated    Transfers Overall transfer level: Needs assistance Equipment  used: None Transfers: Sit to/from Stand Sit to Stand: Min guard         General transfer comment: minguard for rise from EOB for safety and for initial steadying assist    Balance Overall balance assessment: Needs assistance Sitting-balance support: Feet supported Sitting balance-Leahy Scale: Fair Sitting balance - Comments: sitting EOB wit no UE support   Standing balance support: No upper extremity supported;During functional activity Standing balance-Leahy Scale: Fair Standing balance comment: standing at sink for ADLs with no UE support                           ADL either performed or assessed with clinical judgement   ADL Overall ADL's : Needs assistance/impaired     Grooming: Wash/dry face;Standing;Min guard Grooming Details (indicate cue type and reason): standing at sink Upper Body Bathing: Moderate assistance;Sitting Upper Body Bathing Details (indicate cue type and reason): to wash hair with shampoo cap, MOD A as pt needed assist to wash L side d/t LUE deficits from previous CVA         Lower Body Dressing: Min guard;Sitting/lateral leans Lower Body Dressing Details (indicate cue type and reason): to reach socks from EOB Toilet Transfer: Min guard;Ambulation Toilet Transfer Details (indicate cue type and reason): minguard for safety with pt holding on to IV pole for balance         Functional mobility during ADLs: Min guard General ADL Comments: pt continue to present with increased pain, impaired balance and decreased activity tolerance but able to stand for grooming tasks and sit EOB fow hair washing, pt additionally completing household distance functional mobiltiy wtih min guard assist for balance  with pt holding on to IV pole     Vision       Perception     Praxis      Cognition Arousal/Alertness: Awake/alert Behavior During Therapy: Flat affect Overall Cognitive Status: Within Functional Limits for tasks assessed                                  General Comments: flat but appropriate and participatory        Exercises     Shoulder Instructions       General Comments pt on RA during session with SpO2 99% post activity    Pertinent Vitals/ Pain       Pain Assessment: 0-10 Pain Score: 7  (7-8) Pain Location: L side near incision Pain Descriptors / Indicators: Constant;Grimacing;Guarding;Sharp Pain Intervention(s): Limited activity within patient's tolerance;Monitored during session;Repositioned;Premedicated before session  Home Living                                          Prior Functioning/Environment              Frequency  Min 2X/week        Progress Toward Goals  OT Goals(current goals can now be found in the care plan section)  Progress towards OT goals: Progressing toward goals  Acute Rehab OT Goals Patient Stated Goal: none stated today Time For Goal Achievement: 06/20/20 Potential to Achieve Goals: Fair  Plan Discharge plan remains appropriate;Frequency remains appropriate    Co-evaluation                 AM-PAC OT "6 Clicks" Daily Activity     Outcome Measure   Help from another person eating meals?: Total (NPO) Help from another person taking care of personal grooming?: A Little Help from another person toileting, which includes using toliet, bedpan, or urinal?: A Little Help from another person bathing (including washing, rinsing, drying)?: A Little Help from another person to put on and taking off regular upper body clothing?: A Little Help from another person to put on and taking off regular lower body clothing?: A Little 6 Click Score: 16    End of Session    OT Visit Diagnosis: Unsteadiness on feet (R26.81);Other abnormalities of gait and mobility (R26.89);Pain;Muscle weakness (generalized) (M62.81) Pain - Right/Left: Left Pain - part of body:  (flank)   Activity Tolerance Patient tolerated treatment well   Patient Left in  bed;with call bell/phone within reach;with bed alarm set   Nurse Communication          Time: 0300-9233 OT Time Calculation (min): 24 min  Charges: OT General Charges $OT Visit: 1 Visit OT Treatments $Self Care/Home Management : 23-37 mins  Lenor Derrick., COTA/L Acute Rehabilitation Services [phone]    Barron Schmid 06/10/2020, 8:47 AM

## 2020-06-10 NOTE — Progress Notes (Signed)
Nutrition Follow-up  DOCUMENTATION CODES:   Severe malnutrition in context of chronic illness,Underweight  INTERVENTION:   -Continue 15 ml liquid MVI via tube -Continue tube feeding via J-tube: Jevity 1.2 at 70 ml/hr to provide 2016 kcal, 93 gm protein, 1361 ml free water daily.  NUTRITION DIAGNOSIS:   Severe Malnutrition related to chronic illness (esophageal stricture) as evidenced by severe muscle depletion,severe fat depletion.  Ongoing  GOAL:   Patient will meet greater than or equal to 90% of their needs  Met with TF  MONITOR:   Labs,Weight trends,Skin,I & O's,TF tolerance  REASON FOR ASSESSMENT:   Consult Assessment of nutrition requirement/status  ASSESSMENT:   55 yo male admitted with dysphagia. PMH includes esophagitis s/p dilation, alcohol abuse, CAD, dysphagia, CVA, MI, HTN, HLD.  Reviewed I/O's: -1 L x 24 hours and +5.7 L since 05/27/20  UOP: 1.9 L x 24 hours  Per CWOCN note, pt with pain and redness around j-tube site. Plan to cleanse around area with soap and water, pat dry, and apply triple paste 4 times daily.  Pt remains NPO. He continues to receive TF via j-tube: Jevity 1.2 @ 70 ml/hr. Pt continues to tolerate TF well and refeeding labs remain WDL.   Per TOC notes, plan to maximize therapy to prepare for discharge home. Pt may or may not remain in hospital for planned esophagectomy.   Medications reviewed and include senokot.   Labs reviewed: K, Mg, and Phos WDL. Na: 132, CBGS: 107-120 (inpatient orders for glycemic control are none).   Diet Order:   Diet Order            Diet NPO time specified  Diet effective midnight                 EDUCATION NEEDS:   Education needs have been addressed  Skin:  Skin Assessment: Skin Integrity Issues: Skin Integrity Issues:: Incisions Incisions: closed abdomen  Last BM:  06/09/20  Height:   Ht Readings from Last 1 Encounters:  05/24/20 5' 9"  (1.753 m)    Weight:   Wt Readings from Last 1  Encounters:  06/10/20 53.3 kg   BMI:  Body mass index is 17.35 kg/m.  Estimated Nutritional Needs:   Kcal:  1900-2100  Protein:  85-100 gm  Fluid:  >/= 1.8 L    Loistine Chance, RD, LDN, [place] Registered Dietitian II Certified Diabetes Care and Education Specialist Please refer to [facility] for RD and/or RD on-call/weekend/after hours pager

## 2020-06-10 NOTE — TOC Progression Note (Signed)
Transition of Care [facility]) - Progression Note    Patient Details  Name: COHEN BOETTNER MRN: [id number] Date of Birth: January 06, 1966  Transition of Care [facility]) CM/SW Contact  Kermit Balo, RN Phone Number: 06/10/2020, 12:11 PM  Clinical Narrative:    Waiting on surgery. [employer] to provide the enteral TF and supplies. Will need medication assistance at d/c.  TOC following.   Expected Discharge Plan: Home/Self Care Barriers to Discharge: Continued Medical Work up  Expected Discharge Plan and Services Expected Discharge Plan: Home/Self Care   Discharge Planning Services: CM Consult   Living arrangements for the past 2 months: Single Family Home                           HH Arranged: PT,RN,Speech Therapy HH Agency: [facility] Date [facility] Agency Contacted: 06/04/20 Time HH Agency Contacted: [phone] Representative spoke with at [facility] Agency: [name]   Social Determinants of Health (SDOH) Interventions    Readmission Risk Interventions No flowsheet data found.

## 2020-06-10 NOTE — Consult Note (Signed)
WOC Nurse Consult Note: Patient receiving care in [facility] [phone]. Reason for Consult: pain and redness around J tube per Dr. Mervyn Skeeters. [employer] Secure Chat clarification of the location of pain and redness consult order Wound type: Pressure Injury POA: Yes/No/NA Measurement: Wound bed: Drainage (amount, consistency, odor)  Periwound: Dressing procedure/placement/frequency: Cleanse around J tube with soap and water, pat dry. Apply Triple paste then a split gauze dressing. Order for 4 times daily. WOC nurse will not follow at this time.  Please re-consult the WOC team if needed.  Helmut Muster, RN, MSN, CWOCN, CNS-BC, pager [phone]

## 2020-06-10 NOTE — Progress Notes (Signed)
Physical Therapy Treatment Patient Details Name: Ralph [name] MRN: [id number] DOB: Apr 12, 1965 Today's Date: 06/10/2020    History of Present Illness Ralph [name] is a 55 y/o male who reported to ED with complaints of worsening dysphagia, weight loss, and dehydration. Pt admitted on 05/23/20 with dysphagia and incidental covid-19. EGD on 4/22 showed esophageal stricture. EGD again on 4/26 for laparoscopic jejunostomy s/p j-tube. PMH includes esophagitis, esophageal dysphagia, CVA with residual L weakness, MI, back pain, essential HTN, alcohol use, and tobacco use.    PT Comments    Pt in increased pain after dressing change, and has been medicated prior to therapy. Pt reports good progress with OT today and is hopeful but apprehensive that he will be able to have his esophagectomy next Wednesday and not discharge home and need to come back. Educated pt on need to be as strong as possible heading into surgery. Pt agreeable to attempting stair climbing during session today. Pt is supervision for bed mobility, and min guard for transfers, short distance ambulation with RW. Pt initiated stair training today by stepping up and back down from 1 step with simulated rail using footboard of the bed. Pt with increasingly antalgic steps however pt determined to complete and feels very accomplished afterward despite increased pain. PT will work with pt on strengthening and endurance for improved recovery after surgery.       Follow Up Recommendations  Supervision for mobility/OOB;Home health PT     Equipment Recommendations  Rolling walker with 5" wheels;3in1 (PT);Wheelchair (measurements PT);Wheelchair cushion (measurements PT)       Precautions / Restrictions Precautions Precautions: Fall Precaution Comments: j-tube    Mobility  Bed Mobility Overal bed mobility: Needs Assistance Bed Mobility: Supine to Sit;Sit to Supine     Supine to sit: Supervision;HOB elevated Sit to supine: Supervision;HOB elevated    General bed mobility comments: No physical assist required, HOB slightly elevated    Transfers Overall transfer level: Needs assistance Equipment used: None Transfers: Sit to/from Stand Sit to Stand: Min guard         General transfer comment: able to come to standing with min guard for safety  Ambulation/Gait Ambulation/Gait assistance: Min guard Gait Distance (Feet): 10 Feet Assistive device: Rolling walker (2 wheeled) Gait Pattern/deviations: Step-through pattern;Trunk flexed;Decreased stride length Gait velocity: decr Gait velocity interpretation: <1.31 ft/sec, indicative of household ambulator General Gait Details: Assist for lines and safety. No loss of balance.   Stairs Stairs: Yes Stairs assistance: Min guard Stair Management: One rail Right Number of Stairs: 1 (step up 15x to approximate stair climb to his hotel room) General stair comments: pt initiates strong stepping, however decreasing speed and increasing effort as pain increases         Balance Overall balance assessment: Needs assistance Sitting-balance support: Feet supported Sitting balance-Leahy Scale: Fair     Standing balance support: No upper extremity supported Standing balance-Leahy Scale: Fair Standing balance comment: able to static stand without outside assis                            Cognition Arousal/Alertness: Awake/alert Behavior During Therapy: Flat affect Overall Cognitive Status: Within Functional Limits for tasks assessed                                 General Comments: flat, hopeful for esophagectomy before going home rather then returning later  Exercises      General Comments General comments (skin integrity, edema, etc.): VSS on RA      Pertinent Vitals/Pain Pain Assessment: 0-10 Pain Score: 8  Pain Location: L side near incision where leaking Pain Descriptors / Indicators: Constant;Grimacing;Guarding;Sharp Pain Intervention(s):  Premedicated before session;Monitored during session;Limited activity within patient's tolerance;Relaxation;Utilized relaxation techniques           PT Goals (current goals can now be found in the care plan section) Acute Rehab PT Goals Patient Stated Goal: decrease pain, be able to safely navigate steps up to motel PT Goal Formulation: With patient Time For Goal Achievement: 06/16/20 Potential to Achieve Goals: Good Progress towards PT goals: Progressing toward goals    Frequency    Min 3X/week      PT Plan Current plan remains appropriate       AM-PAC PT "6 Clicks" Mobility   Outcome Measure  Help needed turning from your back to your side while in a flat bed without using bedrails?: None Help needed moving from lying on your back to sitting on the side of a flat bed without using bedrails?: None Help needed moving to and from a bed to a chair (including a wheelchair)?: None Help needed standing up from a chair using your arms (e.g., wheelchair or bedside chair)?: None Help needed to walk in hospital room?: A Little Help needed climbing 3-5 steps with a railing? : A Little 6 Click Score: 22    End of Session Equipment Utilized During Treatment: Gait belt Activity Tolerance: Patient limited by pain Patient left: in bed;with call bell/phone within reach;with nursing/sitter in room Nurse Communication: Mobility status PT Visit Diagnosis: Pain;Other abnormalities of gait and mobility (R26.89) Pain - Right/Left: Left Pain - part of body:  (flank)     Time: 1429-1510 PT Time Calculation (min) (ACUTE ONLY): 41 min  Charges:  $Gait Training: 23-37 mins $Therapeutic Activity: 8-22 mins                     Clark Cuff B. Beverely Risen PT, DPT Acute Rehabilitation Services Pager [phone] Office [phone]    Elon Alas Fleet 06/10/2020, 3:35 PM

## 2020-06-11 LAB — GLUCOSE, CAPILLARY
Glucose-Capillary: 101 mg/dL — ABNORMAL HIGH (ref 70–99)
Glucose-Capillary: 103 mg/dL — ABNORMAL HIGH (ref 70–99)
Glucose-Capillary: 103 mg/dL — ABNORMAL HIGH (ref 70–99)
Glucose-Capillary: 117 mg/dL — ABNORMAL HIGH (ref 70–99)
Glucose-Capillary: 118 mg/dL — ABNORMAL HIGH (ref 70–99)
Glucose-Capillary: 119 mg/dL — ABNORMAL HIGH (ref 70–99)
Glucose-Capillary: 98 mg/dL (ref 70–99)

## 2020-06-11 LAB — CBC
HCT: 38.1 % — ABNORMAL LOW (ref 39.0–52.0)
Hemoglobin: 13 g/dL (ref 13.0–17.0)
MCH: 32 pg (ref 26.0–34.0)
MCHC: 34.1 g/dL (ref 30.0–36.0)
MCV: 93.8 fL (ref 80.0–100.0)
Platelets: 516 10*3/uL — ABNORMAL HIGH (ref 150–400)
RBC: 4.06 MIL/uL — ABNORMAL LOW (ref 4.22–5.81)
RDW: 14.5 % (ref 11.5–15.5)
WBC: 17.3 10*3/uL — ABNORMAL HIGH (ref 4.0–10.5)
nRBC: 0 % (ref 0.0–0.2)

## 2020-06-11 MED ORDER — LIDOCAINE 5 % EX PTCH
1.0000 | MEDICATED_PATCH | CUTANEOUS | Status: DC
  Administered 2020-06-11 – 2020-06-13 (×3): 1 via TRANSDERMAL
  Filled 2020-06-11 (×3): qty 1

## 2020-06-11 NOTE — Progress Notes (Signed)
Family Medicine Teaching Service Daily Progress Note Intern Pager: [phone]  Patient name: Ralph [name] Medical record number: [id number] Date of birth: Jun 18, 1965 Age: 55 y.o. Gender: male  Primary Care Provider: Moses Manners, MD Consultants: GI, CT surgery, Gen surgery Code Status: Full   Pt Overview and Major Events to Date:  4/18 admitted 4/22 EGD with findings of significant esophageal stricture not amenable to dilation 4/26 EGD, unsuccessful NG tube placement, laparoscopic jejunostomy  Assessment and Plan: Ralph [name] a 54 y.o.malepresenting with dysphagia and weight loss secondary to severe esophageal stricture. PMH is significant forgastritis and esophageal stricture, CVA, tobacco use, HTN, HLD.  Esophageal obstruction S/p J-tube placement 4/26. Painadequately controlled- will continue to wean down on pain meds. CVTS to see patient today and check pre-albumin to see if able to do esophagectomy while here in the hospital. J tube also with yellow/green purulent leakage and redness surrounding site. Wound care consulted and recommended triple ointment QID.  WBC yesterday 16.6 currently 17.3. -GI and cardiothoracic surgery following, appreciate involvement -scheduled Tylenol 650 mg q 6  - decreased scheduled hydrocodone-Acetaminophen 7.5-325 mg 31mL q6h  -protonix 40 mg daily per tube -carafate 1g daily per tube -monitor Mg and P for refeeding syndrome, replete prn - pre albumin normal at 19 -TOC for dispo: potentially home (hotel) with [name]   Protein calorie malnutrition With cachexia and weight loss in the setting of poor oral intake secondary to esophageal stricture. -tube feeds per RD -nutrition recs: 70 mL/hr to provide 2016 kcal daily. -Mg 2.0 and Phos 3.5 Continue to monitor Mg and Phos q M/W/F for refeeding syndrome.   C/f Pneumonia, resolved  Most recent CXR notable for volume loss in the left hemothorax with patchy airspace involving left lower lung  concerning for possible pneumonia. Patient doing well and asymptomatic. Leukocytosis resolved. Both blood and urine cultures negative.  - PO doxycycline (4/27-4/30) - PO amoxicillin-clavulanate (4/27-4/29) - s/p IV ampicillin-sulbactam (4/24-4/27) - s/p IV vancomycin (4/25-4/26)  HTN  BP 102/69 this morning. Not on any home anti-hypertensive therapy. -monitor BP   HLD Prescribed atorvastatin although not taking it as home med prior to this hospitalization.  - d/c statin due to concern of clogging tube   CVA Patient has history of stroke in 2018 with residual left-sided weakness. Home medications include atorvastatinand ASA, though appears patient is not taking these. -defer to PCP regarding patient to start taking aspirin   COVID-19 Admitted with mild symptoms, overall asymptomatic.  - s/p remdesivir x 3 days -10 day quarantine period completed, COVID precautions removed  Tobacco use disorder Patient reports smoking 1PPD since age of 85, recently cut back to 1/2PPD.  -nicotine patch -consider restarting bupropion    Alcoholuse Reported drinking 1-2 40 oz beers per night. Last drink 4/18.Denies history of alcohol withdrawal. -thiamine  MDD Previously prescribed bupropion but is not taking at home. -consider restarting bupropion outpatient  FEN/GI: J-tube in place PPx: LMWH  Status is: Inpatient  Remains inpatient appropriate because:Inpatient level of care appropriate due to severity of illness  Dispo: The patient is from: Home  Anticipated d/c is to: SNF  Patient currently is medically stable to d/c. Difficult to place patient No   Subjective:  No acute events overnight. Patient complaining of pain this morning at j tube site in addition to his left sided pain. States that overnight there was a lot of leakage, but that the dressing was just changed this morning before I came in. Advised that we were  waiting  for surgery to decide if he can get his esophagectomy   Objective: Temp:  [98.2 F (36.8 C)-98.8 F (37.1 C)] 98.8 F (37.1 C) (05/07 0547) Pulse Rate:  [62-70] 70 (05/07 0547) Resp:  [18-19] 19 (05/07 0547) BP: (102-108)/(66-78) 102/69 (05/07 0547) SpO2:  [94 %-95 %] 94 % (05/07 0547) Physical Exam: General: awake laying in bed, in some pain though NAD Cardiovascular: RRR no murmurs Respiratory: CTAB normal WOB Abdomen: soft, non distended, tender to palpation in LLQ. J tube site with antibiotic ointment and clean dressing. No leakage appreciated  Extremities: warm, dry. No LE edema   Laboratory: Recent Labs  Lab 06/05/20 0054 06/10/20 1024  WBC 8.6 16.6*  HGB 11.2* 13.8  HCT 33.3* 39.6  PLT 208 456*   Recent Labs  Lab 06/07/20 0253 06/08/20 1102 06/10/20 0440  NA 131* 130* 132*  K 4.0 4.8 4.7  CL 99 96* 96*  CO2 25 25 27   BUN 7 10 12   CREATININE 0.43* 0.47* 0.48*  CALCIUM 8.3* 8.7* 9.1  GLUCOSE 110* 93 95    Imaging/Diagnostic Tests: None new  , DO 06/11/2020, 7:28 AM PGY-1, [place] [facility] FPTS Intern pager: [phone], text pages welcome

## 2020-06-12 DIAGNOSIS — Z934 Other artificial openings of gastrointestinal tract status: Secondary | ICD-10-CM

## 2020-06-12 LAB — GLUCOSE, CAPILLARY
Glucose-Capillary: 106 mg/dL — ABNORMAL HIGH (ref 70–99)
Glucose-Capillary: 109 mg/dL — ABNORMAL HIGH (ref 70–99)
Glucose-Capillary: 100 mg/dL — ABNORMAL HIGH (ref 70–99)
Glucose-Capillary: 98 mg/dL (ref 70–99)

## 2020-06-12 NOTE — Progress Notes (Signed)
Family Medicine Teaching Service Daily Progress Note Intern Pager: [phone]  Patient name: Ralph [name] Medical record number: [id number] Date of birth: [birth year] Age: 55 y.o. Gender: male  Primary Care Provider: Moses Manners, MD Consultants: GI, CT surgery, Gen surgery Code Status: Full   Pt Overview and Major Events to Date:  4/18 admitted 4/22 EGD with findings of significant esophageal stricture not amenable to dilation 4/26 EGD, unsuccessful NG tube placement, laparoscopic jejunostomy  Assessment and Plan: Ralph Minder Irbyis a 55 y.o.malepresenting with dysphagia and weight loss secondary to severe esophageal stricture. PMH is significant forgastritis and esophageal stricture, CVA, tobacco use, HTN, HLD.  Esophageal obstruction S/p J-tube placement 4/26. Continues to reports abdominal pain. Had 4 doses of 7.5-325 mg hydrocodone yesterday. Wean pain meds as tolerated. CVTS to re-evaluate for inpatient esophagectomy on Monday.  WBC 17.3 yesterday. Remains afebrile.  -cardiothoracic surgery following, appreciate involvement - GI signed off - wound care consulted - triple antibiotic ointment QID - scheduled Tylenol 650 mg q 6  -Wean hydrocodone-Acetaminophen 7.5-325 mg 64mL q6h  -protonix 40 mg daily per tube -carafate 1g daily per tube -monitor Mg and P for refeeding syndrome (labs MWF), replete prn -TOC for dispo: home at hotel with [facility]   Protein calorie malnutrition With cachexia and weight loss in the setting of poor oral intake secondary to esophageal stricture. -tube feeds per RD -nutrition recs: 70 mL/hr to provide 2016 kcal daily. -Monitor Mg and Phos q M/W/F for refeeding syndrome.   Pneumonia, resolved  CXR notable possible left lower lung pneumonia. Asymptomatic - PO doxycycline (4/27-4/30) - PO amoxicillin-clavulanate (4/27-4/29) - s/p IV ampicillin-sulbactam (4/24-4/27) - s/p IV vancomycin (4/25-4/26)  History of HTN  Remains normotensive. Not on  any home anti-hypertensive therapy. -monitor BP   HLD Prescribed atorvastatin although not taking it as home med prior to this hospitalization.  - d/c statin due to concern of clogging tube   CVA Patient has history of stroke in 2018 with residual left-sided weakness. Home medications include atorvastatinand ASA, though appears patient is not taking these. -defer to PCP regarding patient to start taking aspirin   COVID-19 Admitted with mild symptoms, overall asymptomatic.  - s/p remdesivir x 3 days -10 day quarantine period completed, COVID precautions removed  Tobacco use disorder Patient reports smoking 1PPD since age of 55, recently cut back to 1/2PPD.  -nicotine patch -consider restarting bupropion    Alcoholuse Reported drinking 1-2 40 oz beers per night. Last drink 4/18.Denies history of alcohol withdrawal. -thiaminedaily   MDD Previously prescribed bupropion but is not taking at home. -consider restarting bupropion outpatient  FEN/GI: J-tube in place PPx: LMWH  Status is: Inpatient  Remains inpatient appropriate because:Inpatient level of care appropriate due to severity of illness  Dispo: The patient is from: Home  Anticipated d/c is to: SNF  Patient currently is medically stable to d/c. Difficult to place patient No   Subjective:  No significant overnight events. Continues to have abdominal pain that is worse with dressing changes.  Reports leaking of his J-tube.   Objective: Temp:  [97.7 F (36.5 C)-98.6 F (37 C)] 98.6 F (37 C) (05/08 0430) Pulse Rate:  [61-68] 64 (05/08 0430) Resp:  [16-17] 16 (05/08 0430) BP: (100-117)/(68-86) 117/83 (05/08 0430) SpO2:  [93 %-96 %] 96 % (05/08 0430) Weight:  [53.6 kg] 53.6 kg (05/08 0430)  Physical Exam: GEN:  in bed watching TV, in no acute distress  CV: regular rate and rhythm, no murmurs appreciated  RESP:  no increased work of breathing, clear to  ascultation bilaterally with no crackles, wheezes, or rhonchi  ABD: Bowel sounds present. Soft, J-tube site without erythema, dressing with yellow drainage, mildly tender to palpation MSK: no LE edema, no calf tenderness  SKIN: warm, dry    Laboratory: Recent Labs  Lab 06/10/20 1024 06/11/20 0802  WBC 16.6* 17.3*  HGB 13.8 13.0  HCT 39.6 38.1*  PLT 456* 516*   Recent Labs  Lab 06/07/20 0253 06/08/20 1102 06/10/20 0440  NA 131* 130* 132*  K 4.0 4.8 4.7  CL 99 96* 96*  CO2 25 25 27   BUN 7 10 12   CREATININE 0.43* 0.47* 0.48*  CALCIUM 8.3* 8.7* 9.1  GLUCOSE 110* 93 95    Imaging/Diagnostic Tests: No results found.   , DO 06/12/2020, 8:24 AM PGY-2, [place] [facility] FPTS Intern pager: [phone], text pages welcome

## 2020-06-12 NOTE — Progress Notes (Signed)
Family medicine notified, patient  abdominal pain especially during dressing changes.  Abdomen is tender to touch, skin around J tube is excoriated, pain increased when site is cleaned.  Pain meds given prior to dressing change. Patient requiring multiple dressing changes per shift due to drainage.  Thick yellow green drainage noted on dressing, leaking out onto gown. Advised triple paste is being applied as ordered.  No new orders received, day team to make decision about  plan of care tomorrow.

## 2020-06-13 ENCOUNTER — Other Ambulatory Visit (HOSPITAL_COMMUNITY): Payer: Self-pay

## 2020-06-13 ENCOUNTER — Encounter (HOSPITAL_COMMUNITY): Payer: Self-pay | Admitting: Thoracic Surgery (Cardiothoracic Vascular Surgery)

## 2020-06-13 LAB — CBC
HCT: 37.5 % — ABNORMAL LOW (ref 39.0–52.0)
Hemoglobin: 12.7 g/dL — ABNORMAL LOW (ref 13.0–17.0)
MCH: 31.8 pg (ref 26.0–34.0)
MCHC: 33.9 g/dL (ref 30.0–36.0)
MCV: 94 fL (ref 80.0–100.0)
Platelets: 610 10*3/uL — ABNORMAL HIGH (ref 150–400)
RBC: 3.99 MIL/uL — ABNORMAL LOW (ref 4.22–5.81)
RDW: 14.5 % (ref 11.5–15.5)
WBC: 17.6 10*3/uL — ABNORMAL HIGH (ref 4.0–10.5)
nRBC: 0 % (ref 0.0–0.2)

## 2020-06-13 LAB — BASIC METABOLIC PANEL
Anion gap: 7 (ref 5–15)
BUN: 13 mg/dL (ref 6–20)
CO2: 28 mmol/L (ref 22–32)
Calcium: 9 mg/dL (ref 8.9–10.3)
Chloride: 96 mmol/L — ABNORMAL LOW (ref 98–111)
Creatinine, Ser: 0.47 mg/dL — ABNORMAL LOW (ref 0.61–1.24)
GFR, Estimated: >60 mL/min (ref >60)
Glucose, Bld: 100 mg/dL — ABNORMAL HIGH (ref 70–99)
Potassium: 4.1 mmol/L (ref 3.5–5.1)
Sodium: 131 mmol/L — ABNORMAL LOW (ref 135–145)

## 2020-06-13 LAB — GLUCOSE, CAPILLARY: Glucose-Capillary: 107 mg/dL — ABNORMAL HIGH (ref 70–99)

## 2020-06-13 LAB — MAGNESIUM: Magnesium: 1.9 mg/dL (ref 1.7–2.4)

## 2020-06-13 LAB — PHOSPHORUS: Phosphorus: 3.5 mg/dL (ref 2.5–4.6)

## 2020-06-13 MED ORDER — HYDROXYZINE HCL 10 MG/5ML PO SYRP
10.0000 mg | ORAL_SOLUTION | Freq: Once | ORAL | Status: AC
  Administered 2020-06-13: 10 mg
  Filled 2020-06-13: qty 5

## 2020-06-13 MED ORDER — HYDROXYZINE HCL 10 MG PO TABS: 10.0000 mg | ORAL_TABLET | Freq: Once | ORAL | Status: DC

## 2020-06-13 MED ORDER — LIDOCAINE 5 % EX PTCH
1.0000 | MEDICATED_PATCH | CUTANEOUS | 0 refills | Status: DC
  Filled 2020-06-13: qty 30, 30d supply, fill #0

## 2020-06-13 NOTE — Addendum Note (Signed)
Addendum  created 06/13/20 1232 by Beryle Lathe, MD   Intraprocedure Event edited, Intraprocedure Staff edited

## 2020-06-13 NOTE — Progress Notes (Signed)
Family Medicine Teaching Service Daily Progress Note Intern Pager: [phone]  Patient name: Ralph [name] Medical record number: [id number] Date of birth: [birth year] Age: 55 y.o. Gender: male  Primary Care Provider: Moses Manners, MD Consultants: GI, CT surgery, Gen surgery Code Status: Full  Pt Overview and Major Events to Date:  4/18 admitted 4/22 EGD with findings of significant esophageal stricture not amenable to dilation 4/26 EGD, unsuccessful NG tube placement, laparoscopic jejunostomy  Assessment and Plan: Ralph Bose Irbyis a 54 y.o.malepresenting with dysphagia and weight loss secondary to severe esophageal stricture. PMH is significant forgastritis and esophageal stricture, CVA, tobacco use, HTN, HLD.  Esophageal obstruction S/p J-tube placement 4/26. Continues to reports abdominal pain. Had 4 doses of 7.5-325 mg 47mL hydrocodone yesterday. Wean pain meds as tolerated. CVTS to re-evaluate for inpatient esophagectomy today.  WBC 17.6 . Remains afebrile.  -cardiothoracic surgery following, appreciate involvement - GI signed off - wound care consulted - triple ointment QID - scheduled Tylenol 650 mg q 6  -Wean hydrocodone-Acetaminophen 7.5-325 mg 83mL q6h as able -protonix 40 mg daily per tube -carafate 1g daily per tube -monitor Mg and P for refeeding syndrome (labs MWF), replete prn -TOC for dispo: home at hotel with [facility]   Protein calorie malnutrition With cachexia and weight loss in the setting of poor oral intake secondary to esophageal stricture. -tube feeds per RD -nutrition recs: 70 mL/hr to provide 2016 kcal daily. -Monitor Mg and Phos q M/W/F for refeeding syndrome.   Pneumonia, resolved  CXR notable possible left lower lung pneumonia. Asymptomatic - PO doxycycline (4/27-4/30) - PO amoxicillin-clavulanate (4/27-4/29) - s/p IV ampicillin-sulbactam (4/24-4/27) - s/p IV vancomycin (4/25-4/26)  History of HTN  Remains normotensive. Not on any home  anti-hypertensive therapy. -monitor BP   HLD Prescribed atorvastatin although not taking it as home med prior to this hospitalization.  - d/c statin due to concern of clogging tube   CVA Patient has history of stroke in 2018 with residual left-sided weakness. Home medications include atorvastatinand ASA, though appears patient is not taking these. -defer to PCP regarding patient to start taking aspirin   COVID-19 Admitted with mild symptoms, overall asymptomatic.  - s/p remdesivir x 3 days -10 day quarantine period completed, COVID precautions removed  Tobacco use disorder Patient reports smoking 1PPD since age of 36, recently cut back to 1/2PPD.  -nicotine patch -consider restarting bupropion    Alcoholuse Reported drinking 1-2 40 oz beers per night. Last drink 4/18.Denies history of alcohol withdrawal. -thiaminedaily   MDD Previously prescribed bupropion but is not taking at home. -consider restarting bupropion outpatient  FEN/GI: J-tube in place PPx: LMWH  Status is: Inpatient  Remains inpatient appropriate because:Inpatient level of care appropriate due to severity of illness  Dispo: The patient is from: Home  Anticipated d/c is to: SNF  Patient currently is medically stable to d/c. Difficult to place patient No   Subjective:  No acute events overnight.  Patient states he is just really tired because he did not get much sleep last night.  States that when he starts to fall asleep at about 3 AM nurses come in and draw blood preventing him from being able to sleep.  Still endorses pain and leakage from J-tube site.  Denies any other concerns or complaints this morning.  Objective: Temp:  [98.8 F (37.1 C)-98.9 F (37.2 C)] 98.8 F (37.1 C) (05/09 0420) Pulse Rate:  [67-69] 69 (05/09 0420) Resp:  [16-18] 16 (05/09 0420) BP: (99-106)/(68-75) 106/75 (05/09  0420) SpO2:  [94 %-98 %] 98 % (05/09 0420) Weight:   [53 kg] 53 kg (05/09 0403) Physical Exam: General: laying in bed, NAD Cardiovascular: RRR no murmurs Respiratory: CTAB normal WOB Abdomen: soft, non distended. J tube site with clean bandage without visible leakage. Site under bandage erythematous with ointment Extremities: warm, dry. No LE edema   Laboratory: Recent Labs  Lab 06/10/20 1024 06/11/20 0802 06/13/20 0216  WBC 16.6* 17.3* 17.6*  HGB 13.8 13.0 12.7*  HCT 39.6 38.1* 37.5*  PLT 456* 516* 610*   Recent Labs  Lab 06/08/20 1102 06/10/20 0440 06/13/20 0216  NA 130* 132* 131*  K 4.8 4.7 4.1  CL 96* 96* 96*  CO2 25 27 28   BUN 10 12 13   CREATININE 0.47* 0.48* 0.47*  CALCIUM 8.7* 9.1 9.0  GLUCOSE 93 95 100*     Imaging/Diagnostic Tests: None new  , DO 06/13/2020, 7:10 AM PGY-1, [place] [facility] FPTS Intern pager: [phone], text pages welcome

## 2020-06-13 NOTE — Progress Notes (Signed)
Occupational Therapy Treatment Patient Details Name: Ralph [name] MRN: [id number] DOB: 1966/01/05 Today's Date: 06/13/2020    History of present illness Ralph [name] is a 55 y/o male who reported to ED with complaints of worsening dysphagia, weight loss, and dehydration. Pt admitted on 05/23/20 with dysphagia and incidental covid-19. EGD on 4/22 showed esophageal stricture. EGD again on 4/26 for laparoscopic jejunostomy s/p j-tube. PMH includes esophagitis, esophageal dysphagia, CVA with residual L weakness, MI, back pain, essential HTN, alcohol use, and tobacco use.   OT comments  Pt making steady progress towards OT goals this session. Pt continues to present with increased pain, general debility, and decreased activity tolerance. Pt currently requires min guard assist for functional mobility greater than a household distance with RW, and MOD A for seated grooming tasks d/t impaired strength and AROM in LUE from previous CVA. Education and demonstration provided on all LB AE for bathing and dressing as pain mgmt and energy conservation strategy- pt kept reacher and LH sponge. Placed recliner in pts room and encouraged pt to sit up this afternoon to simulate home environment. Pt would continue to benefit from skilled occupational therapy while admitted and after d/c to address the below listed limitations in order to improve overall functional mobility and facilitate independence with BADL participation. DC plan remains appropriate, will follow acutely per POC.     Follow Up Recommendations  Home health OT;Supervision - Intermittent;Other (comment) (consider postacute rehab if slow progress & continues to be pain limited)    Equipment Recommendations  3 in 1 bedside commode;Other (comment) (RW)    Recommendations for Other Services      Precautions / Restrictions Precautions Precautions: Fall Precaution Comments: j-tube Restrictions Weight Bearing Restrictions: No       Mobility Bed  Mobility Overal bed mobility: Needs Assistance Bed Mobility: Supine to Sit;Sit to Supine     Supine to sit: Supervision;HOB elevated Sit to supine: Supervision;HOB elevated   General bed mobility comments: No physical assist required, HOB slightly elevated    Transfers Overall transfer level: Needs assistance Equipment used: Rolling walker (2 wheeled) Transfers: Sit to/from Stand Sit to Stand: Supervision         General transfer comment: supervision sit<>stand from EOB and chair with no arm rests    Balance Overall balance assessment: Needs assistance Sitting-balance support: Feet supported Sitting balance-Leahy Scale: Fair Sitting balance - Comments: sitting EOB wit no UE support   Standing balance support: No upper extremity supported Standing balance-Leahy Scale: Fair Standing balance comment: able to static stand without outside assist                           ADL either performed or assessed with clinical judgement   ADL Overall ADL's : Needs assistance/impaired     Grooming: Brushing hair;Sitting;Moderate assistance Grooming Details (indicate cue type and reason): MOD A to detangle hair from sitting in front of sink         Upper Body Dressing : Minimal assistance;Sitting Upper Body Dressing Details (indicate cue type and reason): Min A for changing into clean gown Lower Body Dressing: Moderate assistance;Cueing for sequencing;With adaptive equipment Lower Body Dressing Details (indicate cue type and reason): demo'ed use of all LB AE for pain mgmt strategy, pt able to don socks with sock aid with MOD A needing cues to sequence novel task Toilet Transfer: Min guard;Ambulation;RW Toilet Transfer Details (indicate cue type and reason): simulated via functional mobility  Functional mobility during ADLs: Min guard;Rolling walker General ADL Comments: pt continue to present with increased pain, impaired balance and decreased activity  tolerance     Vision       Perception     Praxis      Cognition Arousal/Alertness: Awake/alert Behavior During Therapy: Flat affect Overall Cognitive Status: Within Functional Limits for tasks assessed                                          Exercises     Shoulder Instructions       General Comments issued pt reacher and LH sponge to assist with LB ADLs    Pertinent Vitals/ Pain       Pain Assessment: 0-10 Pain Score: 6  (increasing to 7 with mobility) Pain Location: L side near incision Pain Descriptors / Indicators: Constant;Grimacing;Guarding;Tender;Sore Pain Intervention(s): Monitored during session;Repositioned;Premedicated before session  Home Living                                          Prior Functioning/Environment              Frequency  Min 2X/week        Progress Toward Goals  OT Goals(current goals can now be found in the care plan section)  Progress towards OT goals: Progressing toward goals  Acute Rehab OT Goals Patient Stated Goal: to find out whether or not he has to have a procedure prior to DC home OT Goal Formulation: With patient Time For Goal Achievement: 06/20/20 Potential to Achieve Goals: Fair  Plan Discharge plan remains appropriate;Frequency remains appropriate    Co-evaluation                 AM-PAC OT "6 Clicks" Daily Activity     Outcome Measure   Help from another person eating meals?: Total (NPO) Help from another person taking care of personal grooming?: A Little Help from another person toileting, which includes using toliet, bedpan, or urinal?: A Little Help from another person bathing (including washing, rinsing, drying)?: A Little Help from another person to put on and taking off regular upper body clothing?: A Little Help from another person to put on and taking off regular lower body clothing?: A Little 6 Click Score: 16    End of Session Equipment Utilized  During Treatment: Rolling walker  OT Visit Diagnosis: Unsteadiness on feet (R26.81);Other abnormalities of gait and mobility (R26.89);Pain;Muscle weakness (generalized) (M62.81) Pain - Right/Left: Left Pain - part of body:  (flank)   Activity Tolerance Patient tolerated treatment well   Patient Left in bed;with call bell/phone within reach;with bed alarm set   Nurse Communication Mobility status;Other (comment) (put recliner in pts room; encouraged pt to sit up later this afternoon)        Time: 4403-4742 OT Time Calculation (min): 35 min  Charges: OT General Charges $OT Visit: 1 Visit OT Treatments $Self Care/Home Management : 23-37 mins  Lenor Derrick., COTA/L Acute Rehabilitation Services [phone]    Barron Schmid 06/13/2020, 12:22 PM

## 2020-06-13 NOTE — TOC Progression Note (Addendum)
Transition of Care [facility]) - Progression Note    Patient Details  Name: Ralph [name] MRN: [id number] Date of Birth: January 28, 1966  Transition of Care [facility]) CM/SW Contact  Beckie Busing, RN Phone Number: [phone]  06/13/2020, 2:32 PM  Clinical Narrative:    CM notified about patient discharging and in need of Tube feeding and DME. CM spoke with Ian Malkin at [facility] who verified that enteral TF and pump will be supplies per [employer]. Ian Malkin has been made aware of delivery address which has been verified with patient per CM [facility] [address] [place]) according to the patient he is comfortable with his tube feedings, he just needs education on loading the pump. CM has informed patient that [employer] will provide education on the use of the pump when the pump is delivered. Medications are being filled per [facility] pharmacy. MATCH for has been completed for 30 day supply of meds for patient with no insurance. Meds to be delivered to the bedside per [facility] pharmacy. MD Idalia Needle has been made aware that TOC is unable to provide liquid multivitamin & stool softner. MD is ok with this. CM has reached out to [facility] for charity home health. Patient has orders for [facility] PT aide and RN. MD has been made aware that patient most likely will not receive HH PT / aide but the focus is on [facility] RN due to tube feedings. Info has been provided for [facility] and the CM is awaiting a determination. CM called patient and updated him. Patient states that his significant other can pick him up for transport home. CM has informed patient that the nurse will be updated when everything has been set up.  1610 DME 3 in 1 and rolling walker to be deliverd to the room  per [employer]   1615 Bedside nurse to be updated when TF and pump have been delivered to the hotel for patient. Zach with [employer] has been made aware.   1618 CM spoke with Misty Stanley at [facility] to determine if patient can receive charity HH. Misty Stanley does not have a determination and  will send email and get back with CM asap.   1620 Patient has been declined for charity home health services MD has been made aware.   1630 TOC to sign off [employer] will call unit once TF and pump have been delivered.    Expected Discharge Plan: Home/Self Care Barriers to Discharge: Continued Medical Work up  Expected Discharge Plan and Services Expected Discharge Plan: Home/Self Care   Discharge Planning Services: CM Consult   Living arrangements for the past 2 months: Single Family Home Expected Discharge Date: 06/13/20                         [facility] Arranged: PT,RN,Speech Therapy HH Agency: [facility] Date [facility] Agency Contacted: 06/04/20 Time HH Agency Contacted: [phone] Representative spoke with at [facility] Agency: [name]   Social Determinants of Health (SDOH) Interventions    Readmission Risk Interventions No flowsheet data found.

## 2020-06-13 NOTE — Progress Notes (Signed)
Physical Therapy Treatment Patient Details Name: Ralph [name] MRN: [id number] DOB: [birth year] Today's Date: 06/13/2020    History of Present Illness Ralph [name] is a 55 y/o male who reported to ED with complaints of worsening dysphagia, weight loss, and dehydration. Pt admitted on 05/23/20 with dysphagia and incidental covid-19. EGD on 4/22 showed esophageal stricture. EGD again on 4/26 for laparoscopic jejunostomy s/p j-tube. PMH includes esophagitis, esophageal dysphagia, CVA with residual L weakness, MI, back pain, essential HTN, alcohol use, and tobacco use.    PT Comments    Pt anticipating d/c home shortly, session kept brief for this reason and to preserve pt energy for d/c home. Pt ambulatory in hallway with use of RW and supervision for safety, pt requiring no physical assist to perform mobility. Pt proficiently navigated a flight of steps today, requiring seated rest after ascending due to fatigue and DOE 2/4. PT educated pt on the importance of multiple short bouts of OOB mobility a day to maintain strength and promote circulation, pt expresses understanding. Pt with no further questions, PT let CSM know pt's need for DME prior to d/c.    Follow Up Recommendations  Supervision for mobility/OOB;Home health PT     Equipment Recommendations  Rolling walker with 5" wheels;3in1 (PT)    Recommendations for Other Services       Precautions / Restrictions Precautions Precautions: Fall Precaution Comments: j-tube Restrictions Weight Bearing Restrictions: No    Mobility  Bed Mobility Overal bed mobility: Needs Assistance Bed Mobility: Supine to Sit;Sit to Supine     Supine to sit: Supervision;HOB elevated Sit to supine: Supervision;HOB elevated   General bed mobility comments: No physical assist required, HOB elevated    Transfers Overall transfer level: Needs assistance Equipment used: Rolling walker (2 wheeled) Transfers: Sit to/from Stand Sit to Stand: Supervision          General transfer comment: for safety, STS x2 from EOB and steps in stairwell after ascending flight of steps  Ambulation/Gait Ambulation/Gait assistance: Supervision Gait Distance (Feet): 90 Feet (x2 - to and from the stairwell) Assistive device: Rolling walker (2 wheeled) Gait Pattern/deviations: Step-through pattern;Trunk flexed;Decreased stride length Gait velocity: decr   General Gait Details: Supervision for safety, verbal cuing for upright posture and placement in RW but minimally followed. Pt moaning during gait, pt reporting j-tube site pain   Stairs   Stairs assistance: Min guard Stair Management: One rail Right;Forwards;Alternating pattern Number of Stairs: 18 General stair comments: min guard for safety, verbal cuing for step-to pattern for ascending/descending but pt assuming alternating pattern at slow pace. Seated rest break at top of the stairwell to recover fatigue. Pt states his wife will help him with this at d/c.   Wheelchair Mobility    Modified Rankin (Stroke Patients Only)       Balance Overall balance assessment: Needs assistance Sitting-balance support: Feet supported Sitting balance-Leahy Scale: Fair Sitting balance - Comments: sitting EOB wit no UE support   Standing balance support: No upper extremity supported Standing balance-Leahy Scale: Fair Standing balance comment: able to static stand without outside assist                            Cognition Arousal/Alertness: Awake/alert Behavior During Therapy: Flat affect Overall Cognitive Status: Within Functional Limits for tasks assessed  Exercises      General Comments General comments (skin integrity, edema, etc.): issued pt reacher and LH sponge to assist with LB ADLs      Pertinent Vitals/Pain Pain Assessment: Faces Pain Score: 6  (increasing to 7 with mobility) Faces Pain Scale: Hurts little more Pain Location: L  side near incision Pain Descriptors / Indicators: Grimacing;Guarding;Sore Pain Intervention(s): Limited activity within patient's tolerance;Monitored during session;Repositioned    Home Living                      Prior Function            PT Goals (current goals can now be found in the care plan section) Acute Rehab PT Goals Patient Stated Goal: to find out whether or not he has to have a procedure prior to DC home PT Goal Formulation: With patient Time For Goal Achievement: 06/16/20 Potential to Achieve Goals: Good Progress towards PT goals: Progressing toward goals    Frequency    Min 3X/week      PT Plan Current plan remains appropriate    Co-evaluation              AM-PAC PT "6 Clicks" Mobility   Outcome Measure  Help needed turning from your back to your side while in a flat bed without using bedrails?: None Help needed moving from lying on your back to sitting on the side of a flat bed without using bedrails?: None Help needed moving to and from a bed to a chair (including a wheelchair)?: None Help needed standing up from a chair using your arms (e.g., wheelchair or bedside chair)?: None Help needed to walk in hospital room?: A Little Help needed climbing 3-5 steps with a railing? : A Little 6 Click Score: 22    End of Session Equipment Utilized During Treatment: Gait belt Activity Tolerance: Patient limited by pain Patient left: in bed;with call bell/phone within reach;with nursing/sitter in room Nurse Communication: Mobility status PT Visit Diagnosis: Pain;Other abnormalities of gait and mobility (R26.89) Pain - Right/Left: Left Pain - part of body:  (flank)     Time: 7341-9379 PT Time Calculation (min) (ACUTE ONLY): 15 min  Charges:  $Gait Training: 8-22 mins                     Ralph [name], PT DPT Acute Rehabilitation Services Pager [phone]  Office [phone]    Ralph [name] E Ralph [name] 06/13/2020, 4:10 PM

## 2020-06-13 NOTE — Discharge Instructions (Signed)
Dear Ralph [name],   Thank you so much for allowing [place] to be part of your care!  You were admitted to [facility] for inability to swallow, and had a J tube placed. You will be seen at home with Home Health who will help with your physical therapy and medications. Surgery team will reach out to you about the esophagectomy.    POST-HOSPITAL & CARE INSTRUCTIONS 1. Surgery will set up follow up outpatient  2. Please let PCP/Specialists know of any changes that were made.  3. Please see medications section of this packet for any medication changes.   DOCTOR'S APPOINTMENT & FOLLOW UP CARE INSTRUCTIONS  No future appointments.   Take care and be well!  Family Medicine Teaching Service Inpatient Team [place]  [facility]  [address] [place], [place] [address] [phone]

## 2020-06-13 NOTE — Discharge Summary (Signed)
Family Medicine Teaching [facility] Discharge Summary  Patient name: Ralph [name] Medical record number: [id number] Date of birth: 01/06/1966 Age: 55 y.o. Gender: male Date of Admission: 05/23/2020  Date of Discharge: 06/13/2020 Admitting Physician: Reece Leader, DO  Primary Care Provider: Moses Manners, MD Consultants: GI, CT surgery, Gen surgery  Indication for Hospitalization: Dysphagia due to severe esophageal stricture   Discharge Diagnoses/Problem List:  Dysphagia  Esophageal stricture Protein calorie malnutrition COVID-19  Disposition: Home  Discharge Condition: Stable   Discharge Exam:  Temp:  [98.8 F (37.1 C)-98.9 F (37.2 C)] 98.8 F (37.1 C) (05/09 0420) Pulse Rate:  [67-69] 69 (05/09 0420) Resp:  [16-18] 16 (05/09 0420) BP: (99-106)/(68-75) 106/75 (05/09 0420) SpO2:  [94 %-98 %] 98 % (05/09 0420) Weight:  [53 kg] 53 kg (05/09 0403) Physical Exam: General: laying in bed, NAD Cardiovascular: RRR no murmurs Respiratory: CTAB normal WOB Abdomen: soft, non distended. J tube site with clean bandage without visible leakage. Site under bandage erythematous with ointment Extremities: warm, dry. No LE edema   Brief Hospital Course:   Ralph [name] is a 55 y.o. male presenting with dysphagia and weightloss. PMH is significant for gastritis and esophageal stricture, CVA, tobacco use, HTN, HLD.   Esophageal stricture CT abdomen/pelvis revealed patulous, debris filled esophagus with focal stenosis at the level of the right pulmonary veins.  Started on IV fluids given minimal oral intake. Esophogram was technically limited although noted distended debris-filled mid esophagus without passage of contrast into the distal esophagus during course of study. EGD demonstrated food residue and barium within the entire esophagus with successful removal of food, also noted to have grade D erosive esophagitis with bleeding and esophageal stenosis not amenable to dilation. Biopsy  negative for malignancy.  On 4/26, EGD was attempted with unsuccessful NG tube placement, so underwent laparoscopic jejunostomy and he subsequently was started on tube feeds.  Labs such as Mg, K, and phos were monitored for refeeding syndrome and repleted as needed.  He was continued on twice daily PPI throughout admission.  He received hydrocodone-acetaminophen through his tube for pain control.  He continued to have pain and redness around his J-tube site, and wound care nurse was consulted who recommended cleaning with soap and water 4 times daily and apply triple paste and clean split dressing.   COVID-19 infection Mild infection not requiring supplemental oxygen.  Received 3 day course of remdesivir.  Precautions were discontinued after 10 days on 4/28.  HAP Patient was febrile during admission, CXR with possible signs of pneumonia though no dyspnea or leukocytosis.  Patient was treated with 5 days of antibiotics, initially on IV CTX and vancomycin, then transitioned to doxycycline and amoxicillin-clavulanate for MRSA coverage as his MRSA PCR was positive.  All other issues chronic and stable.  Issues for Follow Up:  1. Ensure patient follows up with GI outpatient. 2. Encourage smoking cessation. 3. Surgery follow up with cardio-thoracic surgery for esophageal resection follow up.   Significant Procedures: laparoscopic jejunostomy  Significant Labs and Imaging:  Recent Labs  Lab 06/10/20 1024 06/11/20 0802 06/13/20 0216  WBC 16.6* 17.3* 17.6*  HGB 13.8 13.0 12.7*  HCT 39.6 38.1* 37.5*  PLT 456* 516* 610*   Recent Labs  Lab 06/07/20 0253 06/08/20 1102 06/10/20 0440 06/13/20 0216  NA 131* 130* 132* 131*  K 4.0 4.8 4.7 4.1  CL 99 96* 96* 96*  CO2 25 25 27 28   GLUCOSE 110* 93 95 100*  BUN 7 10  12 13  CREATININE 0.43* 0.47* 0.48* 0.47*  CALCIUM 8.3* 8.7* 9.1 9.0  MG 1.9 2.0 2.0 1.9  PHOS 3.5 4.0 3.5 3.5    Results/Tests Pending at Time of Discharge: None   Discharge  Medications:  Allergies as of 06/13/2020   No Known Allergies     Medication List    STOP taking these medications   aspirin EC 81 MG tablet   atorvastatin 80 MG tablet Commonly known as: LIPITOR   buPROPion 75 MG tablet Commonly known as: WELLBUTRIN   doxycycline 100 MG tablet Commonly known as: VIBRA-TABS   gabapentin 300 MG capsule Commonly known as: NEURONTIN Replaced by: gabapentin 250 MG/5ML solution   pantoprazole 40 MG tablet Commonly known as: PROTONIX Replaced by: pantoprazole sodium 40 mg/20 mL Pack     TAKE these medications   acetaminophen 160 MG/5ML suspension Commonly known as: TYLENOL Place 20.3 mLs (650 mg total) into feeding tube every 6 (six) hours.   diclofenac Sodium 1 % Gel Commonly known as: VOLTAREN Apply 2 g topically daily as needed (pain to left axillary region).   feeding supplement (JEVITY 1.2 CAL) Liqd Place 1,000 mLs into feeding tube continuous.   gabapentin 250 MG/5ML solution Commonly known as: NEURONTIN Place 6 mLs (300 mg total) into feeding tube every 8 (eight) hours. Replaces: gabapentin 300 MG capsule   lidocaine 5 % Commonly known as: LIDODERM Place 1 patch onto the skin daily. Remove & Discard patch within 12 hours or as directed by MD Start taking on: Jun 14, 2020   multivitamin Liqd Place 15 mLs into feeding tube daily.   pantoprazole sodium 40 mg/20 mL Pack Commonly known as: PROTONIX Place 20 mLs (40 mg total) into feeding tube 2 (two) times daily. Replaces: pantoprazole 40 MG tablet   polyvinyl alcohol 1.4 % ophthalmic solution Commonly known as: LIQUIFILM TEARS Place 1 drop into both eyes as needed for dry eyes.   sennosides 8.8 MG/5ML syrup Commonly known as: SENOKOT Place 5 mLs into feeding tube daily.   sucralfate 1 GM/10ML suspension Commonly known as: CARAFATE Place 10 mLs (1 g total) into feeding tube 4 (four) times daily -  with meals and at bedtime.   Zinc Oxide 12.8 % ointment Commonly known  as: TRIPLE PASTE Apply topically QID.            Durable Medical Equipment  (From admission, onward)         Start     Ordered   06/09/20 1142  For home use only DME Walker rolling  Once       Question Answer Comment  Walker: With 5 Inch Wheels   Patient needs a walker to treat with the following condition Dysphagia      06/09/20 1142   06/09/20 1141  For home use only DME 3 n 1  Once        06/09/20 1140          Discharge Instructions: Please refer to Patient Instructions section of EMR for full details.  Patient was counseled important signs and symptoms that should prompt return to medical care, changes in medications, dietary instructions, activity restrictions, and follow up appointments.   Follow-Up Appointments:   Cora Collum, DO 06/13/2020, 2:05 PM PGY-1, [facility] Family Medicine

## 2020-06-14 ENCOUNTER — Telehealth: Payer: Self-pay

## 2020-06-14 NOTE — Progress Notes (Signed)
Pt given discharge instructions, prescriptions, and care notes. Per Pt Feeding Pump to be delivered home and will be shown how to operate it. This RN offered to show Pt how to operate current pump in room and administer meds but Pt said he knew how to and didn't have any questions regarding care of J tube. Pt verbalized understanding AEB no further questions or concerns at this time. IV was discontinued, no redness, pain, or swelling noted at this time. Pt left the floor via wheelchair with staff in stable condition.

## 2020-06-14 NOTE — Telephone Encounter (Signed)
Patient's significant other, [name], calls nurse line with concerns regarding patient. [name] is requesting phone call from PCP as soon as possible to discuss current care plan. [name] voices concern as she has confusion on maintenance of feeding tube and how to provide care for patient.   Please advise.   [name], RN

## 2020-06-14 NOTE — Plan of Care (Signed)
  Problem: Education: Goal: Knowledge of General Education information will improve Description: Including pain rating scale, medication(s)/side effects and non-pharmacologic comfort measures Outcome: Progressing   Problem: Health Behavior/Discharge Planning: Goal: Ability to manage health-related needs will improve Outcome: Progressing    Problem: Clinical Measurements: Goal: Diagnostic test results will improve Outcome: Progressing   Problem: Nutrition: Goal: Adequate nutrition will be maintained Outcome: Progressing   Problem: Pain Managment: Goal: General experience of comfort will improve Outcome: Progressing     

## 2020-06-14 NOTE — Telephone Encounter (Signed)
Called.  Sig other is overwhelmed by the DC today.  Answered questions as best I could.

## 2020-06-14 NOTE — Progress Notes (Signed)
Physical Therapy Treatment Patient Details Name: Ralph [name] MRN: [id number] DOB: 1965-04-05 Today's Date: 06/14/2020    History of Present Illness Ralph [name] is a 55 y/o male who reported to ED with complaints of worsening dysphagia, weight loss, and dehydration. Pt admitted on 05/23/20 with dysphagia and incidental covid-19. EGD on 4/22 showed esophageal stricture. EGD again on 4/26 for laparoscopic jejunostomy s/p j-tube. PMH includes esophagitis, esophageal dysphagia, CVA with residual L weakness, MI, back pain, essential HTN, alcohol use, and tobacco use.    PT Comments    Pt in bed irritated that no one has been able to help him to the [facility]. Pt able to transfer to [facility] with supervision, and was able to perform self pericare. Pt had numerous phone calls during session to facilitate transportation and pt very upset because he had limited availability of assistance. PT notified RN, who indicated that pt needed training on Jtube pump usage. Relayed information to pt and recommended ambulation while he waited. Adjusted pt's RW height and pt able to ambulate 250 feet with supervision. 1x stop to readjust height of RW to compensate for pt kyphosis. Pt with 2/4 DoE with long distance ambulation. Informed pt that although PT continues to recommend HHPT, charity services are not available. Educated pt on need to progress his mobility with hourly bouts of activity to improve strength and endurance to prepare for upcoming esophageal surgery. Pt verbalizes understanding. Pt hopeful to d/c home this morning.     Follow Up Recommendations  Supervision for mobility/OOB;Home health PT     Equipment Recommendations  Rolling walker with 5" wheels;3in1 (PT)       Precautions / Restrictions Precautions Precautions: Fall Precaution Comments: j-tube Restrictions Weight Bearing Restrictions: No    Mobility  Bed Mobility Overal bed mobility: Needs Assistance Bed Mobility: Supine to Sit;Sit to Supine      Supine to sit: HOB elevated;Modified independent (Device/Increase time)     General bed mobility comments: No physical assist required, HOB elevated    Transfers Overall transfer level: Needs assistance Equipment used: Rolling walker (2 wheeled) Transfers: Sit to/from Stand Sit to Stand: Supervision Stand pivot transfers: Supervision       General transfer comment: supervision for stand pivot from bed to [facility], and from [facility]  Ambulation/Gait Ambulation/Gait assistance: Supervision Gait Distance (Feet): 250 Feet Assistive device: Rolling walker (2 wheeled) Gait Pattern/deviations: Step-through pattern;Trunk flexed;Decreased stride length Gait velocity: decr Gait velocity interpretation: <1.8 ft/sec, indicate of risk for recurrent falls General Gait Details: Supervision for safety, vc for proximity to RW however pt with increased kyphosis contribution to flexed posture for ambulation, adjusted height of RW to accomodate          Balance Overall balance assessment: Needs assistance Sitting-balance support: Feet supported Sitting balance-Leahy Scale: Fair Sitting balance - Comments: sitting EOB wit no UE support   Standing balance support: No upper extremity supported Standing balance-Leahy Scale: Fair Standing balance comment: able to static stand without outside assist                            Cognition Arousal/Alertness: Awake/alert Behavior During Therapy: Flat affect Overall Cognitive Status: Within Functional Limits for tasks assessed                                 General Comments: irritated about the discharge process as he has limited availability of transportation  General Comments General comments (skin integrity, edema, etc.): Pt with 2/4 DoE with long distance ambulation, educated on need for hourly movement to improve strength and endurance as he will not have HHPT, pt verbalizes understanding, also educate on need for  improved strength to prepare for upcoming esophagus surgery      Pertinent Vitals/Pain Pain Assessment: Faces Faces Pain Scale: Hurts a little bit Pain Location: L side near incision Pain Descriptors / Indicators: Grimacing;Guarding;Sore Pain Intervention(s): Limited activity within patient's tolerance;Monitored during session;Repositioned           PT Goals (current goals can now be found in the care plan section) Acute Rehab PT Goals Patient Stated Goal: to find out whether or not he has to have a procedure prior to DC home PT Goal Formulation: With patient Time For Goal Achievement: 06/16/20 Potential to Achieve Goals: Good Progress towards PT goals: Progressing toward goals    Frequency    Min 3X/week      PT Plan Current plan remains appropriate       AM-PAC PT "6 Clicks" Mobility   Outcome Measure  Help needed turning from your back to your side while in a flat bed without using bedrails?: None Help needed moving from lying on your back to sitting on the side of a flat bed without using bedrails?: None Help needed moving to and from a bed to a chair (including a wheelchair)?: None Help needed standing up from a chair using your arms (e.g., wheelchair or bedside chair)?: None Help needed to walk in hospital room?: A Little Help needed climbing 3-5 steps with a railing? : A Little 6 Click Score: 22    End of Session Equipment Utilized During Treatment: Gait belt Activity Tolerance: Patient tolerated treatment well Patient left: with call bell/phone within reach;in chair Nurse Communication: Mobility status;Other (comment) (need for assistance in dressing for imminent discharge) PT Visit Diagnosis: Pain;Other abnormalities of gait and mobility (R26.89) Pain - Right/Left: Left Pain - part of body:  (flank)     Time: 7106-2694 PT Time Calculation (min) (ACUTE ONLY): 41 min  Charges:  $Gait Training: 8-22 mins $Therapeutic Exercise: 8-22 mins $Therapeutic  Activity: 8-22 mins                     Marquies Wanat B. Beverely Risen PT, DPT Acute Rehabilitation Services Pager [phone] Office [phone]    Elon Alas Fleet 06/14/2020, 10:51 AM

## 2020-06-14 NOTE — Discharge Summary (Addendum)
Family Medicine Teaching [facility] Discharge Summary  Patient name: Ralph BlackbirdKeith W [name] Medical record number: [id number] Date of birth: 09-01-1965 Age: 55 y.o. Gender: male Date of Admission: 05/23/2020  Date of Discharge: 06/14/20 Admitting Physician: Reece LeaderAnupa Ganta, DO  Primary Care Provider: Moses MannersHensel, William A, MD Consultants: GI, CT surgery, Gen surgery  Indication for Hospitalization: Dysphagia due to severe esophageal stricture   Discharge Diagnoses/Problem List:   Principal Problem:   Dysphagia Active Problems:   Coronary artery disease   Essential hypertension, benign   Hyponatremia   Tobacco use disorder   Dehydration   Protein-calorie malnutrition, severe   Cough  Disposition: Home [facility]  Discharge Condition: Stable   Discharge Exam:   Temp:  [99 F (37.2 C)-99.3 F (37.4 C)] 99 F (37.2 C) (05/10 0419) Pulse Rate:  [63-68] 63 (05/10 0419) Resp:  [18-19] 19 (05/10 0419) BP: (119-120)/(82-91) 120/91 (05/10 0419) SpO2:  [98 %-99 %] 99 % (05/10 0419) Weight:  [54 kg] 54 kg (05/10 0500)  General: alert, NAD Cardiovascular:RRR no murmurs Respiratory:CTAB normal WOB Abdomen:soft, non distended. J tube site with clean bandage without visible leakage. Site under bandage mildly erythematous and covered with ointment Extremities:warm, dry. No LE edema  Brief Hospital Course:   Ralph FinesKeith W Irbyis a 54 y.o.malepresenting with dysphagia and weightloss. PMH is significant forgastritis and esophageal stricture, CVA, tobacco use, HTN, HLD.  Esophageal stricture CT abdomen/pelvis revealed patulous, debris filled esophagus with focal stenosis at the level of the right pulmonary veins.  Started on IV fluids given minimal oral intake.Esophogram was technically limited although noted distended debris-filled mid esophagus without passage of contrast into the distal esophagus during course of study.EGD demonstratedfood residue and barium within the entire esophagus with  successful removal of food, also noted to have grade D erosive esophagitis with bleeding and esophageal stenosis not amenable to dilation. Biopsy negative for malignancy.  On 4/26, EGD was attempted with unsuccessful NG tube placement, so underwent laparoscopic jejunostomy and he subsequently was started on tube feeds.  Labs such as Mg, K, and phos were monitored for refeeding syndrome and repleted as needed.  He was continued on twice daily PPI throughout admission.  He received hydrocodone-acetaminophen through his tube for pain control.  He continued to have pain and redness around his J-tube site, and wound care nurse was consulted who recommended cleaning with soap and water 4 times daily and apply triple paste and clean split dressing. Patient ended up staying an extra day for feeding tube teaching. Supplies to be sent to patient's home and additional teaching session was planned per social work at patient's home/hotel.   COVID-19 infection Mild infection not requiring supplemental oxygen.  Received 3 day course of remdesivir.  Precautions were discontinued after 10 days on 4/28.  HAP Patient was febrile during admission, CXR with possible signs of pneumonia though no dyspnea or leukocytosis.  Patient was treated with 5 days of antibiotics, initially on IV CTX and vancomycin, then transitioned to doxycycline and amoxicillin-clavulanate for MRSA coverage as his MRSA PCR was positive.  All other issues chronic and stable.  Issues for Follow Up:  1. Ensure patient follows up with GI outpatient. 2. Encourage smoking cessation. 3. Surgery follow up with cardio-thoracic surgery for esophageal resection follow up: appt Friday 5/13 with possible esophagectomy the following week  4. Check J tube site to ensure it is not infected. He was taught how to clean and change wound dressings (see above)  Significant Procedures: laparoscopic jejunostomy  Significant Labs and  Imaging:  Recent Labs  Lab  06/10/20 1024 06/11/20 0802 06/13/20 0216  WBC 16.6* 17.3* 17.6*  HGB 13.8 13.0 12.7*  HCT 39.6 38.1* 37.5*  PLT 456* 516* 610*   Recent Labs  Lab 06/08/20 1102 06/10/20 0440 06/13/20 0216  NA 130* 132* 131*  K 4.8 4.7 4.1  CL 96* 96* 96*  CO2 25 27 28   GLUCOSE 93 95 100*  BUN 10 12 13   CREATININE 0.47* 0.48* 0.47*  CALCIUM 8.7* 9.1 9.0  MG 2.0 2.0 1.9  PHOS 4.0 3.5 3.5   CT CHEST W CONTRAST  Result Date: 05/24/2020 CLINICAL DATA:  Chest and flank pain.  Worsening dysphagia. EXAM: CT CHEST, ABDOMEN, AND PELVIS WITH CONTRAST TECHNIQUE: Multidetector CT imaging of the chest, abdomen and pelvis was performed following the standard protocol during bolus administration of intravenous contrast. CONTRAST:  OMNIPAQUE IOHEXOL 300 MG/ML  SOLN COMPARISON:  None. FINDINGS: CT CHEST FINDINGS Cardiovascular: Heart size is normal. Normal aortic branching pattern. No pericardial effusion. Mediastinum/Nodes: Patulous esophagus is debris-filled with an area of focal narrowing at the level of the right pulmonary veins. (Coronal image 81). No mediastinal lymphadenopathy. Normal thyroid. No axillary adenopathy. Lungs/Pleura: Emphysema. No pleural effusion. No nodules or masses. Musculoskeletal: No chest wall mass or suspicious bone lesions identified. CT ABDOMEN PELVIS FINDINGS Hepatobiliary: No focal liver abnormality is seen. No gallstones, gallbladder wall thickening, or biliary dilatation. Pancreas: Unremarkable. No pancreatic ductal dilatation or surrounding inflammatory changes. Spleen: Normal in size without focal abnormality. Adrenals/Urinary Tract: Adrenal glands are unremarkable. Kidneys are normal, without renal calculi, focal lesion, or hydronephrosis. Bladder is unremarkable. Stomach/Bowel: Stomach is within normal limits. No evidence of bowel wall thickening, distention, or inflammatory changes. Vascular/Lymphatic: Aortic atherosclerosis. No enlarged abdominal or pelvic lymph nodes.  Reproductive: Prostate is unremarkable. Other: No abdominal wall hernia or abnormality. No abdominopelvic ascites. Musculoskeletal: No acute or significant osseous findings. IMPRESSION: 1. No acute abnormality of the chest, abdomen or pelvis. 2. Patulous, debris-filled esophagus with focal stenosis at the level of the right pulmonary veins. Aortic Atherosclerosis (ICD10-I70.0) and Emphysema (ICD10-J43.9). Electronically Signed   By: 05/26/2020 M.D.   On: 05/24/2020 01:48   CT ABDOMEN PELVIS W CONTRAST  Result Date: 05/24/2020 CLINICAL DATA:  Chest and flank pain.  Worsening dysphagia. EXAM: CT CHEST, ABDOMEN, AND PELVIS WITH CONTRAST TECHNIQUE: Multidetector CT imaging of the chest, abdomen and pelvis was performed following the standard protocol during bolus administration of intravenous contrast. CONTRAST:  05/26/2020 OMNIPAQUE IOHEXOL 300 MG/ML  SOLN COMPARISON:  None. FINDINGS: CT CHEST FINDINGS Cardiovascular: Heart size is normal. Normal aortic branching pattern. No pericardial effusion. Mediastinum/Nodes: Patulous esophagus is debris-filled with an area of focal narrowing at the level of the right pulmonary veins. (Coronal image 81). No mediastinal lymphadenopathy. Normal thyroid. No axillary adenopathy. Lungs/Pleura: Emphysema. No pleural effusion. No nodules or masses. Musculoskeletal: No chest wall mass or suspicious bone lesions identified. CT ABDOMEN PELVIS FINDINGS Hepatobiliary: No focal liver abnormality is seen. No gallstones, gallbladder wall thickening, or biliary dilatation. Pancreas: Unremarkable. No pancreatic ductal dilatation or surrounding inflammatory changes. Spleen: Normal in size without focal abnormality. Adrenals/Urinary Tract: Adrenal glands are unremarkable. Kidneys are normal, without renal calculi, focal lesion, or hydronephrosis. Bladder is unremarkable. Stomach/Bowel: Stomach is within normal limits. No evidence of bowel wall thickening, distention, or inflammatory changes.  Vascular/Lymphatic: Aortic atherosclerosis. No enlarged abdominal or pelvic lymph nodes. Reproductive: Prostate is unremarkable. Other: No abdominal wall hernia or abnormality. No abdominopelvic ascites. Musculoskeletal: No acute or significant  osseous findings. IMPRESSION: 1. No acute abnormality of the chest, abdomen or pelvis. 2. Patulous, debris-filled esophagus with focal stenosis at the level of the right pulmonary veins. Aortic Atherosclerosis (ICD10-I70.0) and Emphysema (ICD10-J43.9). Electronically Signed   By: Deatra Robinson M.D.   On: 05/24/2020 01:48   DG CHEST PORT 1 VIEW  Result Date: 05/29/2020 CLINICAL DATA:  Fever. Asymptomatic COVID last week. Fever and leukocytosis today. EXAM: PORTABLE CHEST 1 VIEW COMPARISON:  Radiograph 05/23/2020.  CT 05/24/2020, 5 days ago. FINDINGS: New volume loss in the left hemithorax. Patchy airspace disease involving the left lower lung zone is new from prior. Chronic hyperinflation and bronchial thickening. The heart is normal in size. No pleural effusion or pneumothorax. Stable osseous structures. IMPRESSION: 1. Volume loss in the left hemithorax with patchy airspace disease involving the left lower lung zone is new from prior and suspicious for pneumonia. Given esophageal dilatation on prior CT, aspiration is considered. 2. Underlying emphysema with hyperinflation. Electronically Signed   By: Narda Rutherford M.D.   On: 05/29/2020 22:56   DG Chest Port 1 View  Result Date: 05/23/2020 CLINICAL DATA:  Shortness of breath, pain. EXAM: PORTABLE CHEST 1 VIEW COMPARISON:  07/10/2019 FINDINGS: Heart and mediastinal contours are within normal limits. No focal opacities or effusions. No acute bony abnormality. IMPRESSION: No active cardiopulmonary disease. Electronically Signed   By: Charlett Nose M.D.   On: 05/23/2020 21:39   DG ESOPHAGUS W SINGLE CM (SOL OR THIN BA)  Result Date: 05/26/2020 CLINICAL DATA:  Unable to swallow EXAM: ESOPHOGRAM/BARIUM SWALLOW  TECHNIQUE: Single contrast examination was performed using  thin barium. FLUOROSCOPY TIME:  Fluoroscopy Time:  30 seconds Radiation Exposure Index (if provided by the fluoroscopic device): 1.5 mGy COMPARISON:  None. FINDINGS: Patient swallowed contrast via stroke in a semi supine position. Only a small volume of contrast was tolerated. There is distension of the mid esophagus with intraluminal debris. During the course of study, there is no passage contrast into the distal esophagus. No aspiration. IMPRESSION: Technically limited evaluation. Distended, debris-filled mid esophagus. No passage of contrast into the distal esophagus during course of study. Electronically Signed   By: Guadlupe Spanish M.D.   On: 05/26/2020 14:40    Results/Tests Pending at Time of Discharge: None   Discharge Medications:  Allergies as of 06/14/2020   No Known Allergies     Medication List    STOP taking these medications   aspirin EC 81 MG tablet   atorvastatin 80 MG tablet Commonly known as: LIPITOR   buPROPion 75 MG tablet Commonly known as: WELLBUTRIN   doxycycline 100 MG tablet Commonly known as: VIBRA-TABS   gabapentin 300 MG capsule Commonly known as: NEURONTIN Replaced by: gabapentin 250 MG/5ML solution   pantoprazole 40 MG tablet Commonly known as: PROTONIX Replaced by: pantoprazole sodium 40 mg/20 mL Pack     TAKE these medications   acetaminophen 160 MG/5ML suspension Commonly known as: TYLENOL Place 20.3 mLs (650 mg total) into feeding tube every 6 (six) hours.   diclofenac Sodium 1 % Gel Commonly known as: VOLTAREN Apply 2 g topically daily as needed (pain to left axillary region).   feeding supplement (JEVITY 1.2 CAL) Liqd Place 1,000 mLs into feeding tube continuous.   gabapentin 250 MG/5ML solution Commonly known as: NEURONTIN Place 6 mLs (300 mg total) into feeding tube every 8 (eight) hours. Replaces: gabapentin 300 MG capsule   lidocaine 5 % Commonly known as:  LIDODERM Place 1 patch onto the skin daily.  Remove & Discard patch within 12 hours or as directed by MD   multivitamin Liqd Place 15 mLs into feeding tube daily.   pantoprazole sodium 40 mg/20 mL Pack Commonly known as: PROTONIX Place 20 mLs (40 mg total) into feeding tube 2 (two) times daily. Replaces: pantoprazole 40 MG tablet   polyvinyl alcohol 1.4 % ophthalmic solution Commonly known as: LIQUIFILM TEARS Place 1 drop into both eyes as needed for dry eyes.   sennosides 8.8 MG/5ML syrup Commonly known as: SENOKOT Place 5 mLs into feeding tube daily.   sucralfate 1 GM/10ML suspension Commonly known as: CARAFATE Place 10 mLs (1 g total) into feeding tube 4 (four) times daily -  with meals and at bedtime.   Zinc Oxide 12.8 % ointment Commonly known as: TRIPLE PASTE Apply topically QID.            Durable Medical Equipment  (From admission, onward)         Start     Ordered   06/09/20 1142  For home use only DME Walker rolling  Once       Question Answer Comment  Walker: With 5 Inch Wheels   Patient needs a walker to treat with the following condition Dysphagia      06/09/20 1142   06/09/20 1141  For home use only DME 3 n 1  Once        06/09/20 1140          Discharge Instructions: Please refer to Patient Instructions section of EMR for full details.  Patient was counseled important signs and symptoms that should prompt return to medical care, changes in medications, dietary instructions, activity restrictions, and follow up appointments.    Future Appointments  Date Time Provider Department Center  06/17/2020 10:20 AM Corliss Skains, MD [facility]  06/23/2020 11:10 AM Leveda Anna, [name], MD [facility]     Cora Collum, DO 06/14/2020, 2:32 PM PGY-1, [facility]

## 2020-06-15 ENCOUNTER — Telehealth: Payer: Self-pay

## 2020-06-15 ENCOUNTER — Telehealth: Payer: Self-pay | Admitting: Family Medicine

## 2020-06-15 NOTE — Telephone Encounter (Signed)
Ralph [name] had to cancel patients appointment due to having to go into work. She would like for doctor to call her tomorrow, she stated she had some question, since patient was sent home with a feeding tube. Please advise. Thanks!

## 2020-06-15 NOTE — Telephone Encounter (Signed)
Notified that patient/ wife had called case management office. Returned call to home number. Ralph [name], patient, andswered. He states that the tube feeds and pump were not delivered to the residence.  I reached out to Oletha Cruel w [employer] to notify of situation, and ask assistance getting feeds and pump to Ralph [name]. He states that he will contact the supplier for a stat delivery to the patient's home.  Spoke w Ralph [name], updated him that he will receive call from supplier this afternoon, and they will deliver feeds and pump and explain how to use while there.  Ralph [name] satisfied, appreciated help.

## 2020-06-15 NOTE — Telephone Encounter (Signed)
16:30 Followed up with Ralph [name] re delivery of feeds. He states that they have not been delivered yet. Spoke w Ralph [name] of [employer] who confirmed with [employer] management that they are enroute for delivery, and should be there soon. Updated Ralph [name] that feeds and pump are on their way. He states he has been drinking water and coke, encouraged fluid intake as tolerated and advised that if feeds are not delivered in next few hours to return to ED for feeds to prevent going another night without nutrition. He verbalized understanding of plan.

## 2020-06-16 ENCOUNTER — Ambulatory Visit: Payer: Self-pay | Admitting: Family Medicine

## 2020-06-16 NOTE — Telephone Encounter (Signed)
Called and discussed.  Answered questions.  They now have the pump and the nutrition supplies.  Owain seems to know how to take care of the feeds and tube.

## 2020-06-17 ENCOUNTER — Encounter: Payer: Self-pay | Admitting: Thoracic Surgery (Cardiothoracic Vascular Surgery)

## 2020-06-23 ENCOUNTER — Other Ambulatory Visit: Payer: Self-pay

## 2020-06-23 ENCOUNTER — Ambulatory Visit (INDEPENDENT_AMBULATORY_CARE_PROVIDER_SITE_OTHER): Payer: Self-pay | Admitting: Family Medicine

## 2020-06-23 ENCOUNTER — Encounter: Payer: Self-pay | Admitting: Family Medicine

## 2020-06-23 DIAGNOSIS — L03818 Cellulitis of other sites: Secondary | ICD-10-CM

## 2020-06-23 DIAGNOSIS — Z5989 Other problems related to housing and economic circumstances: Secondary | ICD-10-CM

## 2020-06-23 DIAGNOSIS — F339 Major depressive disorder, recurrent, unspecified: Secondary | ICD-10-CM

## 2020-06-23 DIAGNOSIS — L0390 Cellulitis, unspecified: Secondary | ICD-10-CM | POA: Insufficient documentation

## 2020-06-23 DIAGNOSIS — E43 Unspecified severe protein-calorie malnutrition: Secondary | ICD-10-CM

## 2020-06-23 MED ORDER — MUPIROCIN 2 % EX OINT: 1.0000 "application " | TOPICAL_OINTMENT | Freq: Two times a day (BID) | CUTANEOUS | 1 refills | Status: DC

## 2020-06-23 NOTE — Assessment & Plan Note (Signed)
Understandably depressed.  He is slightly better since hosp DC and now that we have a better long term plan.

## 2020-06-23 NOTE — Progress Notes (Signed)
    SUBJECTIVE:   CHIEF COMPLAINT / HPI:   Hospital FU.  Terrible situation.   1. Esophageal stricture with 100% stenosis.  Ultimately will need esophagectomy. 2. Severe protein calorie malnutrition secondary to #1.  Now on tube feeds.  Tube feeds recently increased due to poor weight gain.  The good news is that he has tube feed supplies and equipment. 3. PEG site red and irritated.  Some leakage.  Has been using desitin. Locally painful.  No fever. 4. Poor social situation.  No insurance.  Lives in hotel with sig other, who works cleaning houses to support them.  Only has minimal home health support. 5. Depression.  Cries easily as does wife.  Both are overwhelmed.  Not suicidal.  They are maintaining day to day.     OBJECTIVE:   BP (!) 104/58   Pulse 72   Ht 5\' 8"  (1.727 m)   Wt 118 lb 9.6 oz (53.8 kg)   SpO2 92%   BMI 18.03 kg/m   Thin as a rale but color good. Lungs clear Abd tender around G-tube.  Skin irritation.  Perhaps some early infection.  ASSESSMENT/PLAN:   Cellulitis Possible early around g tube site.  Also could just be irritation.  Rx with bactroban ointment both as antibiotic and barrier  Does not have health insurance Limits home health.  I would like wound care to see but not a good option.  Depression, recurrent (HCC) Understandably depressed.  He is slightly better since hosp DC and now that we have a better long term plan.  Protein-calorie malnutrition, severe I am glad tube feeds increased.  He has maintained but not gained wt on previous regimen.       , MD [facility]

## 2020-06-23 NOTE — Assessment & Plan Note (Signed)
Limits home health.  I would like wound care to see but not a good option.

## 2020-06-23 NOTE — Patient Instructions (Signed)
See me again in one month. [name] knows how to get me.  Call me if you need me.   We are all doing what we can.  None of [place] can fix this problem I am glad they upped your calories.  My goal is to get you back to at least 130 lbs.

## 2020-06-23 NOTE — Assessment & Plan Note (Signed)
I am glad tube feeds increased.  He has maintained but not gained wt on previous regimen.

## 2020-06-23 NOTE — Assessment & Plan Note (Signed)
Possible early around g tube site.  Also could just be irritation.  Rx with bactroban ointment both as antibiotic and barrier

## 2020-06-29 ENCOUNTER — Telehealth: Payer: Self-pay

## 2020-06-29 DIAGNOSIS — R109 Unspecified abdominal pain: Secondary | ICD-10-CM

## 2020-06-29 NOTE — Telephone Encounter (Signed)
Patient's significant other, [name], calls nurse line requesting to speak with Dr. Leveda Anna as soon as possible. Rudi Heap that provider is not available at this time and I would send provider message.   [name] reports that area around g-tube is infected. Reports purulent drainage and "sharp stabbing pain." Per chart review, patient was seen for this concern on 06/23/20 and Bactroban was started.   Denies fever, redness. [name] reports that patient is not listening and following the directions given by provider.   Patient currently has medicaid pending, therefore, they are unable to receive home health assistance at this time.   Will forward to PCP for next steps.   [name], RN

## 2020-06-30 DIAGNOSIS — R109 Unspecified abdominal pain: Secondary | ICD-10-CM | POA: Insufficient documentation

## 2020-06-30 MED ORDER — OXYCODONE HCL 5 MG/5ML PO SOLN: 5.0000 mg | Freq: Every evening | ORAL | 0 refills | Status: DC | PRN

## 2020-06-30 NOTE — Addendum Note (Signed)
Addended by: Moses Manners on: 06/30/2020 02:42 PM   Modules accepted: Orders

## 2020-06-30 NOTE — Assessment & Plan Note (Signed)
At gastrostomy site.

## 2020-06-30 NOTE — Telephone Encounter (Signed)
Multiple issues: 1. Gastrostomy site improving on bactroban.  Less red and irritated. 2. Considerable pain Waking up screaming at night.  Patient on tylenol.  Has alcohol abuse hx.  No narcotic abuse.  Will prescribe short term narcotic for night use only.   3. Finances a problem.  Disability still pending.

## 2020-06-30 NOTE — Telephone Encounter (Signed)
Rx sent as requested to second pharmacy.

## 2020-06-30 NOTE — Telephone Encounter (Signed)
Received phone call from [facility] pharmacy that they do not have oxycodone solution. Canceled rx with [facility].   Patient is requesting that medication be [facility] *Pharmacist called and verified that they have medication at this location.   Forwarding to PCP  [name], RN

## 2020-07-08 ENCOUNTER — Ambulatory Visit (INDEPENDENT_AMBULATORY_CARE_PROVIDER_SITE_OTHER): Payer: Self-pay | Admitting: Thoracic Surgery (Cardiothoracic Vascular Surgery)

## 2020-07-08 ENCOUNTER — Other Ambulatory Visit: Payer: Self-pay | Admitting: Thoracic Surgery (Cardiothoracic Vascular Surgery)

## 2020-07-08 ENCOUNTER — Encounter: Payer: Self-pay | Admitting: Thoracic Surgery (Cardiothoracic Vascular Surgery)

## 2020-07-08 ENCOUNTER — Other Ambulatory Visit: Payer: Self-pay

## 2020-07-08 VITALS — BP 107/71 | HR 60 | Resp 20 | Ht 68.0 in | Wt 121.0 lb

## 2020-07-08 DIAGNOSIS — Z09 Encounter for follow-up examination after completed treatment for conditions other than malignant neoplasm: Secondary | ICD-10-CM

## 2020-07-08 DIAGNOSIS — R109 Unspecified abdominal pain: Secondary | ICD-10-CM

## 2020-07-08 MED ORDER — GABAPENTIN 250 MG/5ML PO SOLN: 300.0000 mg | Freq: Three times a day (TID) | ORAL | 12 refills | Status: DC

## 2020-07-08 MED ORDER — SUCRALFATE 1 GM/10ML PO SUSP: 1.0000 g | Freq: Three times a day (TID) | ORAL | 0 refills | Status: DC

## 2020-07-08 MED ORDER — OXYCODONE HCL 5 MG/5ML PO SOLN: 5.0000 mg | Freq: Every evening | ORAL | 0 refills | Status: DC | PRN

## 2020-07-08 NOTE — Progress Notes (Signed)
     [address]       [name] [address]             [phone]       Mr. Claunch comes in for hospital follow-up.  He is a 55 year old gentleman with a critical esophageal stricture who required placement of a laparoscopic J-tube.  He was also admitted with COVID-pneumonia.  Since being discharged she has been able to tolerate his tube feeds at a rate of 100 mils per hour, and has gained approximately 3 pounds.  He continues to smoke cigarettes.  Vitals:   07/08/20 1257  BP: 107/71  Pulse: 60  Resp: 20  SpO2: 98%   Alert NAD Easy work of breathing Abdomen soft with J-tube in place Incisions have healed well.  55 year old male with a critical distal esophageal stricture.  Biopsies have not shown any malignancy.  He is also recovered from his COVID-pneumonia.  Given the nature of the stricture he will require an esophagectomy.  This will be performed with the assistance of the robot in an Ivor Lewis fashion.  He is tentatively scheduled for the next 2 weeks.  He will require PFTs, and a cardiac stress test.  He is tentatively scheduled for the next 2 weeks.

## 2020-07-08 NOTE — Addendum Note (Signed)
Addended by: Corliss Skains on: 07/08/2020 05:36 PM   Modules accepted: Orders

## 2020-07-11 ENCOUNTER — Other Ambulatory Visit: Payer: Self-pay | Admitting: Thoracic Surgery (Cardiothoracic Vascular Surgery)

## 2020-07-11 DIAGNOSIS — I2510 Atherosclerotic heart disease of native coronary artery without angina pectoris: Secondary | ICD-10-CM

## 2020-07-11 DIAGNOSIS — R109 Unspecified abdominal pain: Secondary | ICD-10-CM

## 2020-07-12 ENCOUNTER — Other Ambulatory Visit (HOSPITAL_COMMUNITY)
Admission: RE | Admit: 2020-07-12 | Discharge: 2020-07-12 | Disposition: A | Discharge status: Home / Self Care | Payer: Medicaid Other | Source: Ambulatory Visit | Attending: Thoracic Surgery (Cardiothoracic Vascular Surgery) | Admitting: Thoracic Surgery (Cardiothoracic Vascular Surgery)

## 2020-07-12 DIAGNOSIS — Z20822 Contact with and (suspected) exposure to covid-19: Secondary | ICD-10-CM | POA: Diagnosis not present

## 2020-07-12 DIAGNOSIS — Z01812 Encounter for preprocedural laboratory examination: Secondary | ICD-10-CM | POA: Diagnosis present

## 2020-07-12 LAB — SARS CORONAVIRUS 2 (TAT 6-24 HRS): SARS Coronavirus 2: NEGATIVE

## 2020-07-14 ENCOUNTER — Ambulatory Visit (HOSPITAL_COMMUNITY)
Admission: RE | Admit: 2020-07-14 | Discharge: 2020-07-14 | Disposition: A | Discharge status: Home / Self Care | Payer: Medicaid Other | Source: Ambulatory Visit | Attending: Thoracic Surgery (Cardiothoracic Vascular Surgery) | Admitting: Thoracic Surgery (Cardiothoracic Vascular Surgery)

## 2020-07-14 ENCOUNTER — Other Ambulatory Visit: Payer: Self-pay

## 2020-07-14 DIAGNOSIS — Z09 Encounter for follow-up examination after completed treatment for conditions other than malignant neoplasm: Secondary | ICD-10-CM | POA: Insufficient documentation

## 2020-07-14 LAB — PULMONARY FUNCTION TEST
DL/VA % pred: 79 %
DL/VA: 3.52 ml/min/mmHg/L
DLCO unc % pred: 69 %
DLCO unc: 19.01 ml/min/mmHg
FEF 25-75 Post: 1.89 L/sec
FEF 25-75 Pre: 1.22 L/sec
FEF2575-%Change-Post: 54 %
FEF2575-%Pred-Post: 59 %
FEF2575-%Pred-Pre: 38 %
FEV1-%Change-Post: 17 %
FEV1-%Pred-Post: 66 %
FEV1-%Pred-Pre: 56 %
FEV1-Post: 2.39 L
FEV1-Pre: 2.04 L
FEV1FVC-%Change-Post: 12 %
FEV1FVC-%Pred-Pre: 74 %
FEV6-%Change-Post: 4 %
FEV6-%Pred-Post: 81 %
FEV6-%Pred-Pre: 78 %
FEV6-Post: 3.68 L
FEV6-Pre: 3.53 L
FEV6FVC-%Pred-Post: 103 %
FEV6FVC-%Pred-Pre: 103 %
FVC-%Change-Post: 4 %
FVC-%Pred-Post: 78 %
FVC-%Pred-Pre: 75 %
FVC-Post: 3.68 L
FVC-Pre: 3.53 L
Post FEV1/FVC ratio: 65 %
Post FEV6/FVC ratio: 100 %
Pre FEV1/FVC ratio: 58 %
Pre FEV6/FVC Ratio: 100 %
RV % pred: 194 %
RV: 3.83 L
TLC % pred: 111 %
TLC: 7.32 L

## 2020-07-14 MED ORDER — ALBUTEROL SULFATE (2.5 MG/3ML) 0.083% IN NEBU
2.5000 mg | INHALATION_SOLUTION | Freq: Once | RESPIRATORY_TRACT | Status: AC
  Administered 2020-07-14: 2.5 mg via RESPIRATORY_TRACT

## 2020-07-15 ENCOUNTER — Telehealth (HOSPITAL_COMMUNITY): Payer: Self-pay | Admitting: *Deleted

## 2020-07-15 NOTE — Telephone Encounter (Signed)
Close encounter 

## 2020-07-19 ENCOUNTER — Telehealth (HOSPITAL_COMMUNITY): Payer: Self-pay | Admitting: *Deleted

## 2020-07-19 NOTE — Telephone Encounter (Signed)
Close encounter 

## 2020-07-20 ENCOUNTER — Other Ambulatory Visit: Payer: Self-pay

## 2020-07-20 ENCOUNTER — Ambulatory Visit (HOSPITAL_COMMUNITY)
Admission: RE | Admit: 2020-07-20 | Discharge: 2020-07-20 | Disposition: A | Discharge status: Home / Self Care | Payer: Medicaid Other | Source: Ambulatory Visit | Attending: Cardiovascular Disease | Admitting: Cardiovascular Disease

## 2020-07-20 DIAGNOSIS — Z09 Encounter for follow-up examination after completed treatment for conditions other than malignant neoplasm: Secondary | ICD-10-CM | POA: Diagnosis present

## 2020-07-20 LAB — MYOCARDIAL PERFUSION IMAGING
LV dias vol: 90 mL (ref 62–150)
LV sys vol: 38 mL
Peak HR: 77 {beats}/min
Rest HR: 54 {beats}/min
SDS: 0
SRS: 0
SSS: 2
TID: 0.98

## 2020-07-20 MED ORDER — TECHNETIUM TC 99M TETROFOSMIN IV KIT
9.5000 | PACK | Freq: Once | INTRAVENOUS | Status: AC | PRN
  Administered 2020-07-20: 9.5 via INTRAVENOUS
  Filled 2020-07-20: qty 10

## 2020-07-20 MED ORDER — AMINOPHYLLINE 25 MG/ML IV SOLN
75.0000 mg | Freq: Once | INTRAVENOUS | Status: AC
  Administered 2020-07-20: 75 mg via INTRAVENOUS

## 2020-07-20 MED ORDER — TECHNETIUM TC 99M TETROFOSMIN IV KIT
29.1000 | PACK | Freq: Once | INTRAVENOUS | Status: AC | PRN
  Administered 2020-07-20: 29.1 via INTRAVENOUS
  Filled 2020-07-20: qty 30

## 2020-07-20 MED ORDER — REGADENOSON 0.4 MG/5ML IV SOLN
0.4000 mg | Freq: Once | INTRAVENOUS | Status: AC
  Administered 2020-07-20: 0.4 mg via INTRAVENOUS

## 2020-08-12 ENCOUNTER — Other Ambulatory Visit: Payer: Self-pay | Admitting: *Deleted

## 2020-08-12 ENCOUNTER — Encounter: Payer: Self-pay | Admitting: Thoracic Surgery (Cardiothoracic Vascular Surgery)

## 2020-08-12 ENCOUNTER — Other Ambulatory Visit: Payer: Self-pay | Admitting: Thoracic Surgery (Cardiothoracic Vascular Surgery)

## 2020-08-12 ENCOUNTER — Ambulatory Visit (INDEPENDENT_AMBULATORY_CARE_PROVIDER_SITE_OTHER): Payer: Self-pay | Admitting: Thoracic Surgery (Cardiothoracic Vascular Surgery)

## 2020-08-12 ENCOUNTER — Other Ambulatory Visit: Payer: Self-pay

## 2020-08-12 VITALS — BP 122/84 | HR 62 | Resp 20 | Ht 68.0 in | Wt 123.6 lb

## 2020-08-12 DIAGNOSIS — K222 Esophageal obstruction: Secondary | ICD-10-CM

## 2020-08-12 NOTE — H&P (View-Only) (Signed)
[address]       [place] [address]             [phone]                    Ralph [name] [facility] Medical Record #[id number] Date of Birth: 08-Apr-1965  Referring: Moses Manners, MD Primary Care: Pcp, No Primary Cardiologist: None  Chief Complaint:    Chief Complaint  Patient presents with   ESOPHAGEAL STRICTURE    Further discuss scheduling surgery, PFT's 07/14/20, Stress test 07/20/20    History of Present Illness:    Ralph [name] 55 y.o. male presents in follow-up.  He is a 55 year old gentleman with a history of a benign esophageal stricture who was originally evaluated as an inpatient for placement of jejunostomy feeding tube.  He states that he has gained weight over the last few weeks.  He remains unable to tolerate solid food, and has difficulty with liquids as well.  He does continue to smoke and is been unable to stop.  He would like to proceed with surgery.  His stress test was negative and his pulmonary function tests were adequate.      Zubrod Score: At the time of surgery this patient's most appropriate activity status/level should be described as: [x]     0    Normal activity, no symptoms []     1    Restricted in physical strenuous activity but ambulatory, able to do out light work []     2    Ambulatory and capable of self care, unable to do work activities, up and about               >50 % of waking hours                              []     3    Only limited self care, in bed greater than 50% of waking hours []     4    Completely disabled, no self care, confined to bed or chair []     5    Moribund   Past Medical History:  Diagnosis Date   Coronary artery disease    Myocardial infarction (HCC)    Stroke [facility])     Past Surgical History:  Procedure Laterality Date   APPENDECTOMY     BALLOON DILATION N/A 06/01/2019   Procedure: BALLOON DILATION;  Surgeon: , MD;  Location: [facility] ENDOSCOPY;  Service: Endoscopy;  Laterality:  N/A;   BALLOON DILATION N/A 07/16/2019   Procedure: BALLOON DILATION;  Surgeon: , MD;  [facility]: [facility];  Service: Endoscopy;  Laterality: N/A;   BALLOON DILATION N/A 09/09/2019   Procedure: BALLOON DILATION;  Surgeon: [facility], MD;  Location: [facility];  Service: Endoscopy;  Laterality: N/A;   BIOPSY  04/29/2019   Procedure: BIOPSY;  Surgeon: Vida Rigger, MD;  Location: [facility] ENDOSCOPY;  Service: Endoscopy;;   BIOPSY  05/27/2019   Procedure: BIOPSY;  Surgeon: 11/09/2019, MD;  [facility]: [facility];  Service: Endoscopy;;   BIOPSY  06/01/2019   Procedure: BIOPSY;  Surgeon: 05/01/2019, MD;  [facility];  Service: Endoscopy;;   BIOPSY  07/16/2019   Procedure: BIOPSY;  Surgeon: [facility], MD;  Location: [facility];  Service: Endoscopy;;   BIOPSY  05/27/2020   Procedure: BIOPSY;  Surgeon: [name], MD;  Location: [facility] ENDOSCOPY;  Service: Endoscopy;;   ESOPHAGOGASTRODUODENOSCOPY N/A 06/01/2019   Procedure: ESOPHAGOGASTRODUODENOSCOPY (EGD);  Surgeon: Vida Rigger, MD;  Location: [facility] ENDOSCOPY;  Service: Endoscopy;  Laterality: N/A;   ESOPHAGOGASTRODUODENOSCOPY N/A 05/31/2020   Procedure: ESOPHAGOGASTRODUODENOSCOPY (EGD);  Surgeon: Corliss Skains, MD;  Location: [facility] OR;  Service: Thoracic;  Laterality: N/A;   ESOPHAGOGASTRODUODENOSCOPY (EGD) WITH PROPOFOL N/A 04/29/2019   Procedure: ESOPHAGOGASTRODUODENOSCOPY (EGD) WITH PROPOFOL;  Surgeon: Charlott Rakes, MD;  Location: [facility] ENDOSCOPY;  Service: Endoscopy;  Laterality: N/A;   ESOPHAGOGASTRODUODENOSCOPY (EGD) WITH PROPOFOL N/A 05/27/2019   Procedure: ESOPHAGOGASTRODUODENOSCOPY (EGD) WITH PROPOFOL;  Surgeon: [name], MD;  [facility];  Service: Endoscopy;  Laterality: N/A;   ESOPHAGOGASTRODUODENOSCOPY (EGD) WITH PROPOFOL N/A 07/16/2019   Procedure: ESOPHAGOGASTRODUODENOSCOPY (EGD) WITH PROPOFOL;  Surgeon: Vida Rigger, MD;  [facility];  Service: Endoscopy;  Laterality: N/A;    ESOPHAGOGASTRODUODENOSCOPY (EGD) WITH PROPOFOL N/A 09/09/2019   Procedure: ESOPHAGOGASTRODUODENOSCOPY (EGD) WITH PROPOFOL;  Surgeon: Vida Rigger, MD;  [facility];  Service: Endoscopy;  Laterality: N/A;   ESOPHAGOGASTRODUODENOSCOPY (EGD) WITH PROPOFOL N/A 05/27/2020   Procedure: ESOPHAGOGASTRODUODENOSCOPY (EGD) WITH PROPOFOL;  Surgeon: Napoleon Form, MD;  Location: [facility] END[facility];  Service: Endoscopy;  Laterality: N/A;   FINE NEEDLE ASPIRATION  09/09/2019   Procedure: FINE NEEDLE ASPIRATION (FNA) LINEAR;  Surgeon: Vida Rigger, MD;  [facility]: [facility];  Service: Endoscopy;;   FOREIGN BODY REMOVAL  07/16/2019   Procedure: FOREIGN BODY REMOVAL;  Surgeon: Vida Rigger, MD;  [facility]: [facility];  Service: Endoscopy;;   FOREIGN BODY REMOVAL  05/27/2020   Procedure: FOREIGN BODY REMOVAL;  Surgeon: Napoleon Form, MD;  Location: [facility];  Service: Endoscopy;;   LAPAROSCOPIC JEJUNOSTOMY N/A 05/31/2020   Procedure: LAPAROSCOPIC JEJUNOSTOMY;  Surgeon: Corliss Skains, MD;  Location: [facility] OR;  Service: Thoracic;  Laterality: N/A;  EGD, C-arm required.   SHOULDER SURGERY     UPPER ESOPHAGEAL ENDOSCOPIC ULTRASOUND (EUS) N/A 09/09/2019   Procedure: UPPER ESOPHAGEAL ENDOSCOPIC ULTRASOUND (EUS);  Surgeon: [name], MD;  Location: [facility] ENDOSCOPY;  Service: Endoscopy;  Laterality: N/A;    No family history on file.   Social History   Tobacco Use  Smoking Status Every Day   Packs/day: 1.50   Years: 20.00   Pack years: 30.00   Types: Cigarettes  Smokeless Tobacco Never    Social History   Substance and Sexual Activity  Alcohol Use Yes   Alcohol/week: 1.0 standard drink   Types: 1 Standard drinks or equivalent per week     No Known Allergies  Current Outpatient Medications  Medication Sig Dispense Refill   diclofenac Sodium (VOLTAREN) 1 % GEL Apply 2 g topically daily as needed (pain to left axillary region). 100 g 0   gabapentin (NEURONTIN) 250 MG/5ML solution  Place 6 mLs (300 mg total) into feeding tube every 8 (eight) hours. 470 mL 12   lidocaine (LIDODERM) 5 % Place 1 patch onto the skin daily. Remove & Discard patch within 12 hours or as directed by MD 30 patch 0   Nutritional Supplements (FEEDING SUPPLEMENT, JEVITY 1.2 CAL,) LIQD Place 1,000 mLs into feeding tube continuous. 1000 mL 0   oxyCODONE (ROXICODONE) 5 MG/5ML solution Place 5 mLs (5 mg total) into feeding tube at bedtime as needed for severe pain. 50 mL 0   pantoprazole sodium (PROTONIX) 40 mg/20 mL PACK Place 20 mLs (40 mg total) into feeding tube 2 (two) times daily. 1200 mL 0   polyvinyl alcohol (LIQUIFILM TEARS) 1.4 % ophthalmic  solution Place 1 drop into both eyes as needed for dry eyes.     acetaminophen (TYLENOL) 160 MG/5ML suspension Place 20.3 mLs (650 mg total) into feeding tube every 6 (six) hours. (Patient not taking: Reported on 08/12/2020) 120 mL 0   mupirocin ointment (BACTROBAN) 2 % Apply 1 application topically 2 (two) times daily. (Patient not taking: Reported on 08/12/2020) 30 g 1   sennosides (SENOKOT) 8.8 MG/5ML syrup Place 5 mLs into feeding tube daily. (Patient not taking: Reported on 08/12/2020) 240 mL 0   sucralfate (CARAFATE) 1 GM/10ML suspension Place 10 mLs (1 g total) into feeding tube 4 (four) times daily -  with meals and at bedtime. (Patient not taking: Reported on 08/12/2020) 420 mL 0   No current facility-administered medications for this visit.    Review of Systems  Constitutional: Negative.   Gastrointestinal:  Positive for abdominal pain.  Musculoskeletal:  Positive for back pain and myalgias.    PHYSICAL EXAMINATION: BP 122/84   Pulse 62   Resp 20   Ht 5\' 8"  (1.727 m)   Wt 123 lb 9.6 oz (56.1 kg)   SpO2 97% Comment: RA  BMI 18.79 kg/m  Physical Exam Constitutional:      General: He is not in acute distress.    Appearance: He is ill-appearing. He is not toxic-appearing.  Eyes:     Extraocular Movements: Extraocular movements intact.   Cardiovascular:     Rate and Rhythm: Normal rate.  Pulmonary:     Effort: Pulmonary effort is normal. No respiratory distress.  Abdominal:     General: Abdomen is flat. There is no distension.  Musculoskeletal:        General: Normal range of motion.  Neurological:     Mental Status: He is alert.        I have independently reviewed the above radiology studies  and reviewed the findings with the patient.   Recent Lab Findings: Lab Results  Component Value Date   WBC 17.6 (H) 06/13/2020   HGB 12.7 (L) 06/13/2020   HCT 37.5 (L) 06/13/2020   PLT 610 (H) 06/13/2020   GLUCOSE 100 (H) 06/13/2020   CHOL 194 11/25/2012   TRIG 52 06/01/2019   HDL 50 11/25/2012   LDLDIRECT 67 10/23/2017   LDLCALC 125 (H) 11/25/2012   ALT 10 05/26/2020   AST 15 05/26/2020   NA 131 (L) 06/13/2020   K 4.1 06/13/2020   CL 96 (L) 06/13/2020   CREATININE 0.47 (L) 06/13/2020   BUN 13 06/13/2020   CO2 28 06/13/2020   TSH 1.152 05/01/2019   INR 0.9 04/29/2019   HGBA1C 4.9 04/28/2019      Assessment / Plan:   55 year old gentleman an esophageal stricture.  He would like to proceed with an esophagectomy, and his stress test is negative.  He is tentatively scheduled for a robotic assisted Ivor Lewis esophagectomy.  Risks and benefits have been discussed.     I  spent 40 minutes with the patient face to face counseling and coordination of care.    44 08/12/2020 5:17 PM

## 2020-08-12 NOTE — Progress Notes (Signed)
[address]       [place] [address]             [phone]                    [name] [facility] Medical Record #[id number] Date of Birth: 08-Apr-1965  Referring: Moses Manners, MD Primary Care: Pcp, No Primary Cardiologist: None  Chief Complaint:    Chief Complaint  Patient presents with   ESOPHAGEAL STRICTURE    Further discuss scheduling surgery, PFT's 07/14/20, Stress test 07/20/20    History of Present Illness:    Ralph [name] 55 y.o. male presents in follow-up.  He is a 55 year old gentleman with a history of a benign esophageal stricture who was originally evaluated as an inpatient for placement of jejunostomy feeding tube.  He states that he has gained weight over the last few weeks.  He remains unable to tolerate solid food, and has difficulty with liquids as well.  He does continue to smoke and is been unable to stop.  He would like to proceed with surgery.  His stress test was negative and his pulmonary function tests were adequate.      Zubrod Score: At the time of surgery this patient's most appropriate activity status/level should be described as: [x]     0    Normal activity, no symptoms []     1    Restricted in physical strenuous activity but ambulatory, able to do out light work []     2    Ambulatory and capable of self care, unable to do work activities, up and about               >50 % of waking hours                              []     3    Only limited self care, in bed greater than 50% of waking hours []     4    Completely disabled, no self care, confined to bed or chair []     5    Moribund   Past Medical History:  Diagnosis Date   Coronary artery disease    Myocardial infarction (HCC)    Stroke [facility])     Past Surgical History:  Procedure Laterality Date   APPENDECTOMY     BALLOON DILATION N/A 06/01/2019   Procedure: BALLOON DILATION;  Surgeon: , MD;  [facility]: [facility];  Service: Endoscopy;  Laterality:  N/A;   BALLOON DILATION N/A 07/16/2019   Procedure: BALLOON DILATION;  Surgeon: , MD;  [facility]: [facility];  Service: Endoscopy;  Laterality: N/A;   BALLOON DILATION N/A 09/09/2019   Procedure: BALLOON DILATION;  Surgeon: [facility], MD;  Location: [facility];  Service: Endoscopy;  Laterality: N/A;   BIOPSY  04/29/2019   Procedure: BIOPSY;  Surgeon: Vida Rigger, MD;  Location: [facility] ENDOSCOPY;  Service: Endoscopy;;   BIOPSY  05/27/2019   Procedure: BIOPSY;  Surgeon: 11/09/2019, MD;  [facility];  Service: Endoscopy;;   BIOPSY  06/01/2019   Procedure: BIOPSY;  Surgeon: 05/01/2019, MD;  [facility];  Service: Endoscopy;;   BIOPSY  07/16/2019   Procedure: BIOPSY;  Surgeon: [facility], MD;  Location: [facility];  Service: Endoscopy;;   BIOPSY  05/27/2020   Procedure: BIOPSY;  Surgeon: [name], MD;  Location: [facility];  Service: Endoscopy;;   ESOPHAGOGASTRODUODENOSCOPY N/A 06/01/2019   Procedure: ESOPHAGOGASTRODUODENOSCOPY (EGD);  Surgeon: Vida Rigger, MD;  Location: [facility] ENDOSCOPY;  Service: Endoscopy;  Laterality: N/A;   ESOPHAGOGASTRODUODENOSCOPY N/A 05/31/2020   Procedure: ESOPHAGOGASTRODUODENOSCOPY (EGD);  Surgeon: Corliss Skains, MD;  Location: [facility] OR;  Service: Thoracic;  Laterality: N/A;   ESOPHAGOGASTRODUODENOSCOPY (EGD) WITH PROPOFOL N/A 04/29/2019   Procedure: ESOPHAGOGASTRODUODENOSCOPY (EGD) WITH PROPOFOL;  Surgeon: Charlott Rakes, MD;  Location: [facility] ENDOSCOPY;  Service: Endoscopy;  Laterality: N/A;   ESOPHAGOGASTRODUODENOSCOPY (EGD) WITH PROPOFOL N/A 05/27/2019   Procedure: ESOPHAGOGASTRODUODENOSCOPY (EGD) WITH PROPOFOL;  Surgeon: [name], MD;  [facility];  Service: Endoscopy;  Laterality: N/A;   ESOPHAGOGASTRODUODENOSCOPY (EGD) WITH PROPOFOL N/A 07/16/2019   Procedure: ESOPHAGOGASTRODUODENOSCOPY (EGD) WITH PROPOFOL;  Surgeon: Vida Rigger, MD;  [facility];  Service: Endoscopy;  Laterality: N/A;    ESOPHAGOGASTRODUODENOSCOPY (EGD) WITH PROPOFOL N/A 09/09/2019   Procedure: ESOPHAGOGASTRODUODENOSCOPY (EGD) WITH PROPOFOL;  Surgeon: Vida Rigger, MD;  [facility]: [facility];  Service: Endoscopy;  Laterality: N/A;   ESOPHAGOGASTRODUODENOSCOPY (EGD) WITH PROPOFOL N/A 05/27/2020   Procedure: ESOPHAGOGASTRODUODENOSCOPY (EGD) WITH PROPOFOL;  Surgeon: Napoleon Form, MD;  Location: [facility];  Service: Endoscopy;  Laterality: N/A;   FINE NEEDLE ASPIRATION  09/09/2019   Procedure: FINE NEEDLE ASPIRATION (FNA) LINEAR;  Surgeon: Vida Rigger, MD;  Location: [facility];  Service: Endoscopy;;   FOREIGN BODY REMOVAL  07/16/2019   Procedure: FOREIGN BODY REMOVAL;  Surgeon: Vida Rigger, MD;  [facility]: [facility];  Service: Endoscopy;;   FOREIGN BODY REMOVAL  05/27/2020   Procedure: FOREIGN BODY REMOVAL;  Surgeon: Napoleon Form, MD;  Location: [facility];  Service: Endoscopy;;   LAPAROSCOPIC JEJUNOSTOMY N/A 05/31/2020   Procedure: LAPAROSCOPIC JEJUNOSTOMY;  Surgeon: Corliss Skains, MD;  Location: [facility] OR;  Service: Thoracic;  Laterality: N/A;  EGD, C-arm required.   SHOULDER SURGERY     UPPER ESOPHAGEAL ENDOSCOPIC ULTRASOUND (EUS) N/A 09/09/2019   Procedure: UPPER ESOPHAGEAL ENDOSCOPIC ULTRASOUND (EUS);  Surgeon: [name], MD;  Location: [facility] ENDOSCOPY;  Service: Endoscopy;  Laterality: N/A;    No family history on file.   Social History   Tobacco Use  Smoking Status Every Day   Packs/day: 1.50   Years: 20.00   Pack years: 30.00   Types: Cigarettes  Smokeless Tobacco Never    Social History   Substance and Sexual Activity  Alcohol Use Yes   Alcohol/week: 1.0 standard drink   Types: 1 Standard drinks or equivalent per week     No Known Allergies  Current Outpatient Medications  Medication Sig Dispense Refill   diclofenac Sodium (VOLTAREN) 1 % GEL Apply 2 g topically daily as needed (pain to left axillary region). 100 g 0   gabapentin (NEURONTIN) 250 MG/5ML solution  Place 6 mLs (300 mg total) into feeding tube every 8 (eight) hours. 470 mL 12   lidocaine (LIDODERM) 5 % Place 1 patch onto the skin daily. Remove & Discard patch within 12 hours or as directed by MD 30 patch 0   Nutritional Supplements (FEEDING SUPPLEMENT, JEVITY 1.2 CAL,) LIQD Place 1,000 mLs into feeding tube continuous. 1000 mL 0   oxyCODONE (ROXICODONE) 5 MG/5ML solution Place 5 mLs (5 mg total) into feeding tube at bedtime as needed for severe pain. 50 mL 0   pantoprazole sodium (PROTONIX) 40 mg/20 mL PACK Place 20 mLs (40 mg total) into feeding tube 2 (two) times daily. 1200 mL 0   polyvinyl alcohol (LIQUIFILM TEARS) 1.4 % ophthalmic  solution Place 1 drop into both eyes as needed for dry eyes.     acetaminophen (TYLENOL) 160 MG/5ML suspension Place 20.3 mLs (650 mg total) into feeding tube every 6 (six) hours. (Patient not taking: Reported on 08/12/2020) 120 mL 0   mupirocin ointment (BACTROBAN) 2 % Apply 1 application topically 2 (two) times daily. (Patient not taking: Reported on 08/12/2020) 30 g 1   sennosides (SENOKOT) 8.8 MG/5ML syrup Place 5 mLs into feeding tube daily. (Patient not taking: Reported on 08/12/2020) 240 mL 0   sucralfate (CARAFATE) 1 GM/10ML suspension Place 10 mLs (1 g total) into feeding tube 4 (four) times daily -  with meals and at bedtime. (Patient not taking: Reported on 08/12/2020) 420 mL 0   No current facility-administered medications for this visit.    Review of Systems  Constitutional: Negative.   Gastrointestinal:  Positive for abdominal pain.  Musculoskeletal:  Positive for back pain and myalgias.    PHYSICAL EXAMINATION: BP 122/84   Pulse 62   Resp 20   Ht 5\' 8"  (1.727 m)   Wt 123 lb 9.6 oz (56.1 kg)   SpO2 97% Comment: RA  BMI 18.79 kg/m  Physical Exam Constitutional:      General: He is not in acute distress.    Appearance: He is ill-appearing. He is not toxic-appearing.  Eyes:     Extraocular Movements: Extraocular movements intact.   Cardiovascular:     Rate and Rhythm: Normal rate.  Pulmonary:     Effort: Pulmonary effort is normal. No respiratory distress.  Abdominal:     General: Abdomen is flat. There is no distension.  Musculoskeletal:        General: Normal range of motion.  Neurological:     Mental Status: He is alert.        I have independently reviewed the above radiology studies  and reviewed the findings with the patient.   Recent Lab Findings: Lab Results  Component Value Date   WBC 17.6 (H) 06/13/2020   HGB 12.7 (L) 06/13/2020   HCT 37.5 (L) 06/13/2020   PLT 610 (H) 06/13/2020   GLUCOSE 100 (H) 06/13/2020   CHOL 194 11/25/2012   TRIG 52 06/01/2019   HDL 50 11/25/2012   LDLDIRECT 67 10/23/2017   LDLCALC 125 (H) 11/25/2012   ALT 10 05/26/2020   AST 15 05/26/2020   NA 131 (L) 06/13/2020   K 4.1 06/13/2020   CL 96 (L) 06/13/2020   CREATININE 0.47 (L) 06/13/2020   BUN 13 06/13/2020   CO2 28 06/13/2020   TSH 1.152 05/01/2019   INR 0.9 04/29/2019   HGBA1C 4.9 04/28/2019      Assessment / Plan:   55 year old gentleman an esophageal stricture.  He would like to proceed with an esophagectomy, and his stress test is negative.  He is tentatively scheduled for a robotic assisted Ivor Lewis esophagectomy.  Risks and benefits have been discussed.     I  spent 40 minutes with the patient face to face counseling and coordination of care.    44 08/12/2020 5:17 PM

## 2020-08-15 NOTE — Pre-Procedure Instructions (Addendum)
Surgical Instructions    Your procedure is scheduled on Thursday July 14th.  Report to [facility] Main Entrance "A" at 05:30 A.M., then check in with the Admitting office.  Call this number if you have problems the morning of surgery:  [phone]   If you have any questions prior to your surgery date call [phone]: Open Monday-Friday 8am-4pm    Remember:  Do not eat or drink after midnight the night before your surgery     Take these medicines the morning of surgery with A SIP OF WATER   polyvinyl alcohol (LIQUIFILM TEARS)- If needed  As of today, STOP taking any Aspirin (unless otherwise instructed by your surgeon) Aleve, Naproxen, Ibuprofen, Motrin, Advil, Goody's, BC's, all herbal medications, fish oil, and all vitamins.                     Do NOT Smoke (Tobacco/Vaping) or drink Alcohol 24 hours prior to your procedure.  If you use a CPAP at night, you may bring all equipment for your overnight stay.   Contacts, glasses, piercing's, hearing aid's, dentures or partials may not be worn into surgery, please bring cases for these belongings.    For patients admitted to the hospital, discharge time will be determined by your treatment team.   Patients discharged the day of surgery will not be allowed to drive home, and someone needs to stay with them for 24 hours.  ONLY 1 SUPPORT PERSON MAY BE PRESENT WHILE YOU ARE IN SURGERY. IF YOU ARE TO BE ADMITTED ONCE YOU ARE IN YOUR ROOM YOU WILL BE ALLOWED TWO (2) VISITORS.  Minor children may have two parents present. Special consideration for safety and communication needs will be reviewed on a case by case basis.   Special instructions:   [facility]- Preparing For Surgery  Before surgery, you can play an important role. Because skin is not sterile, your skin needs to be as free of germs as possible. You can reduce the number of germs on your skin by washing with CHG (chlorahexidine gluconate) Soap before surgery.  CHG is an  antiseptic cleaner which kills germs and bonds with the skin to continue killing germs even after washing.    Oral Hygiene is also important to reduce your risk of infection.  Remember - BRUSH YOUR TEETH THE MORNING OF SURGERY WITH YOUR REGULAR TOOTHPASTE  Please do not use if you have an allergy to CHG or antibacterial soaps. If your skin becomes reddened/irritated stop using the CHG.  Do not shave (including legs and underarms) for at least 48 hours prior to first CHG shower. It is OK to shave your face.  Please follow these instructions carefully.   Shower the NIGHT BEFORE SURGERY and the MORNING OF SURGERY  If you chose to wash your hair, wash your hair first as usual with your normal shampoo.  After you shampoo, rinse your hair and body thoroughly to remove the shampoo.  Use CHG Soap as you would any other liquid soap. You can apply CHG directly to the skin and wash gently with a scrungie or a clean washcloth.   Apply the CHG Soap to your body ONLY FROM THE NECK DOWN.  Do not use on open wounds or open sores. Avoid contact with your eyes, ears, mouth and genitals (private parts). Wash Face and genitals (private parts)  with your normal soap.   Wash thoroughly, paying special attention to the area where your surgery will be performed.  Thoroughly  rinse your body with warm water from the neck down.  DO NOT shower/wash with your normal soap after using and rinsing off the CHG Soap.  Pat yourself dry with a CLEAN TOWEL.  Wear CLEAN PAJAMAS to bed the night before surgery  Place CLEAN SHEETS on your bed the night before your surgery  DO NOT SLEEP WITH PETS.   Day of Surgery: Shower with CHG soap. Do not wear jewelry, make up, nail polish, gel polish, artificial nails, or any other type of covering on natural nails including finger and toenails. If patients have artificial nails, gel coating, etc. that need to be removed by a nail salon please have this removed prior to surgery.  Surgery may need to be canceled/delayed if the surgeon/ anesthesia feels like the patient is unable to be adequately monitored. Do not wear lotions, powders, perfumes/colognes, or deodorant. Do not shave 48 hours prior to surgery.  Men may shave face and neck. Do not bring valuables to the hospital. [facility] is not responsible for any belongings or valuables. Wear Clean/Comfortable clothing the morning of surgery Remember to brush your teeth WITH YOUR REGULAR TOOTHPASTE.   Please read over the following fact sheets that you were given.

## 2020-08-16 ENCOUNTER — Encounter (HOSPITAL_COMMUNITY)
Admission: RE | Admit: 2020-08-16 | Discharge: 2020-08-16 | Disposition: A | Discharge status: Home / Self Care | Payer: Medicaid Other | Source: Ambulatory Visit | Attending: Thoracic Surgery (Cardiothoracic Vascular Surgery) | Admitting: Thoracic Surgery (Cardiothoracic Vascular Surgery)

## 2020-08-16 ENCOUNTER — Ambulatory Visit (HOSPITAL_COMMUNITY)
Admission: RE | Admit: 2020-08-16 | Discharge: 2020-08-16 | Disposition: A | Discharge status: Home / Self Care | Payer: Medicaid Other | Source: Ambulatory Visit | Attending: Thoracic Surgery (Cardiothoracic Vascular Surgery) | Admitting: Thoracic Surgery (Cardiothoracic Vascular Surgery)

## 2020-08-16 ENCOUNTER — Encounter (HOSPITAL_COMMUNITY): Payer: Self-pay

## 2020-08-16 ENCOUNTER — Other Ambulatory Visit: Payer: Self-pay

## 2020-08-16 DIAGNOSIS — K222 Esophageal obstruction: Secondary | ICD-10-CM

## 2020-08-16 DIAGNOSIS — Z20822 Contact with and (suspected) exposure to covid-19: Secondary | ICD-10-CM | POA: Insufficient documentation

## 2020-08-16 DIAGNOSIS — Z01818 Encounter for other preprocedural examination: Secondary | ICD-10-CM | POA: Diagnosis not present

## 2020-08-16 DIAGNOSIS — I700 Atherosclerosis of aorta: Secondary | ICD-10-CM | POA: Insufficient documentation

## 2020-08-16 HISTORY — DX: Anxiety disorder, unspecified: F41.9

## 2020-08-16 LAB — CBC
HCT: 43.1 % (ref 39.0–52.0)
Hemoglobin: 14.1 g/dL (ref 13.0–17.0)
MCH: 31.5 pg (ref 26.0–34.0)
MCHC: 32.7 g/dL (ref 30.0–36.0)
MCV: 96.2 fL (ref 80.0–100.0)
Platelets: 291 10*3/uL (ref 150–400)
RBC: 4.48 MIL/uL (ref 4.22–5.81)
RDW: 14.2 % (ref 11.5–15.5)
WBC: 8.9 10*3/uL (ref 4.0–10.5)
nRBC: 0 % (ref 0.0–0.2)

## 2020-08-16 LAB — BLOOD GAS, ARTERIAL
Acid-Base Excess: 1.3 mmol/L (ref 0.0–2.0)
Bicarbonate: 25.1 mmol/L (ref 20.0–28.0)
FIO2: 21
O2 Saturation: 95.9 %
Patient temperature: 37
pCO2 arterial: 38.2 mmHg (ref 32.0–48.0)
pH, Arterial: 7.433 (ref 7.350–7.450)
pO2, Arterial: 75.1 mmHg — ABNORMAL LOW (ref 83.0–108.0)

## 2020-08-16 LAB — URINALYSIS, ROUTINE W REFLEX MICROSCOPIC
Bilirubin Urine: NEGATIVE
Glucose, UA: NEGATIVE mg/dL
Hgb urine dipstick: NEGATIVE
Ketones, ur: NEGATIVE mg/dL
Leukocytes,Ua: NEGATIVE
Nitrite: NEGATIVE
Protein, ur: NEGATIVE mg/dL
Specific Gravity, Urine: 1.016 (ref 1.005–1.030)
pH: 7 (ref 5.0–8.0)

## 2020-08-16 LAB — PROTIME-INR
INR: 0.9 (ref 0.8–1.2)
Prothrombin Time: 11.7 seconds (ref 11.4–15.2)

## 2020-08-16 LAB — COMPREHENSIVE METABOLIC PANEL
ALT: 12 U/L (ref 0–44)
AST: 21 U/L (ref 15–41)
Albumin: 3.4 g/dL — ABNORMAL LOW (ref 3.5–5.0)
Alkaline Phosphatase: 63 U/L (ref 38–126)
Anion gap: 8 (ref 5–15)
BUN: 10 mg/dL (ref 6–20)
CO2: 23 mmol/L (ref 22–32)
Calcium: 9 mg/dL (ref 8.9–10.3)
Chloride: 100 mmol/L (ref 98–111)
Creatinine, Ser: 0.5 mg/dL — ABNORMAL LOW (ref 0.61–1.24)
GFR, Estimated: >60 mL/min (ref >60)
Glucose, Bld: 95 mg/dL (ref 70–99)
Potassium: 4.3 mmol/L (ref 3.5–5.1)
Sodium: 131 mmol/L — ABNORMAL LOW (ref 135–145)
Total Bilirubin: 0.7 mg/dL (ref 0.3–1.2)
Total Protein: 6.7 g/dL (ref 6.5–8.1)

## 2020-08-16 LAB — TYPE AND SCREEN
ABO/RH(D): A POS
Antibody Screen: NEGATIVE

## 2020-08-16 LAB — SARS CORONAVIRUS 2 (TAT 6-24 HRS): SARS Coronavirus 2: NEGATIVE

## 2020-08-16 LAB — SURGICAL PCR SCREEN
MRSA, PCR: NEGATIVE
Staphylococcus aureus: NEGATIVE

## 2020-08-16 LAB — APTT: aPTT: 33 seconds (ref 24–36)

## 2020-08-16 NOTE — Progress Notes (Signed)
PCP - Dr. Doralee Albino Cardiologist -   PPM/ICD - n/a Device Orders - n/a Rep Notified - n/a  Chest x-ray - 08/16/20 EKG - 08/16/20 Stress Test - 07/20/20 ECHO - 04/10/19 Cardiac Cath - denies PFT- 07/14/20  Sleep Study - denies CPAP - n/a  Fasting Blood Sugar - n/a Checks Blood Sugar __ times a day- n/a  Blood Thinner Instructions: n/a Aspirin Instructions: n/a  ERAS Protcol - n/a PRE-SURGERY Ensure or G2- n/a  COVID TEST- 08/16/20 at PAT appointment. Pending   Anesthesia review: Yes. Cardiac History  Patient denies shortness of breath, fever, cough and chest pain at PAT appointment   All instructions explained to the patient, with a verbal understanding of the material. Patient agrees to go over the instructions while at home for a better understanding. Patient also instructed to self quarantine after being tested for COVID-19. The opportunity to ask questions was provided.

## 2020-08-16 NOTE — Progress Notes (Signed)
[employer] sent to Dr. Cliffton Asters to notify of patient's abnormal U/A.

## 2020-08-17 ENCOUNTER — Other Ambulatory Visit: Payer: Self-pay | Admitting: *Deleted

## 2020-08-17 ENCOUNTER — Encounter: Payer: Self-pay | Admitting: *Deleted

## 2020-08-17 DIAGNOSIS — K222 Esophageal obstruction: Secondary | ICD-10-CM

## 2020-08-18 ENCOUNTER — Inpatient Hospital Stay (HOSPITAL_COMMUNITY)
Admission: RE | Admit: 2020-08-18 | Payer: Medicaid Other | Source: Home / Self Care | Admitting: Thoracic Surgery (Cardiothoracic Vascular Surgery)

## 2020-08-18 ENCOUNTER — Encounter (HOSPITAL_COMMUNITY): Admission: RE | Payer: Self-pay | Source: Home / Self Care

## 2020-08-18 SURGERY — ESOPHAGECTOMY, ROBOT-ASSISTED
Anesthesia: General | Site: Chest

## 2020-08-30 ENCOUNTER — Encounter (HOSPITAL_COMMUNITY)
Admission: RE | Admit: 2020-08-30 | Discharge: 2020-08-30 | Disposition: A | Discharge status: Home / Self Care | Payer: Medicaid Other | Source: Ambulatory Visit | Attending: Thoracic Surgery (Cardiothoracic Vascular Surgery) | Admitting: Thoracic Surgery (Cardiothoracic Vascular Surgery)

## 2020-08-30 ENCOUNTER — Ambulatory Visit (HOSPITAL_COMMUNITY)
Admission: RE | Admit: 2020-08-30 | Discharge: 2020-08-30 | Disposition: A | Discharge status: Home / Self Care | Payer: Medicaid Other | Source: Ambulatory Visit | Attending: Thoracic Surgery (Cardiothoracic Vascular Surgery) | Admitting: Thoracic Surgery (Cardiothoracic Vascular Surgery)

## 2020-08-30 ENCOUNTER — Other Ambulatory Visit: Payer: Self-pay

## 2020-08-30 ENCOUNTER — Encounter (HOSPITAL_COMMUNITY): Payer: Self-pay

## 2020-08-30 DIAGNOSIS — K222 Esophageal obstruction: Secondary | ICD-10-CM

## 2020-08-30 DIAGNOSIS — Z20822 Contact with and (suspected) exposure to covid-19: Secondary | ICD-10-CM | POA: Diagnosis not present

## 2020-08-30 DIAGNOSIS — Z01818 Encounter for other preprocedural examination: Secondary | ICD-10-CM | POA: Diagnosis present

## 2020-08-30 LAB — CBC
HCT: 44.4 % (ref 39.0–52.0)
Hemoglobin: 15.3 g/dL (ref 13.0–17.0)
MCH: 31.9 pg (ref 26.0–34.0)
MCHC: 34.5 g/dL (ref 30.0–36.0)
MCV: 92.7 fL (ref 80.0–100.0)
Platelets: 269 10*3/uL (ref 150–400)
RBC: 4.79 MIL/uL (ref 4.22–5.81)
RDW: 13.6 % (ref 11.5–15.5)
WBC: 6.7 10*3/uL (ref 4.0–10.5)
nRBC: 0 % (ref 0.0–0.2)

## 2020-08-30 LAB — COMPREHENSIVE METABOLIC PANEL
ALT: 8 U/L (ref 0–44)
AST: 17 U/L (ref 15–41)
Albumin: 3.7 g/dL (ref 3.5–5.0)
Alkaline Phosphatase: 69 U/L (ref 38–126)
Anion gap: 8 (ref 5–15)
BUN: 11 mg/dL (ref 6–20)
CO2: 27 mmol/L (ref 22–32)
Calcium: 9.6 mg/dL (ref 8.9–10.3)
Chloride: 96 mmol/L — ABNORMAL LOW (ref 98–111)
Creatinine, Ser: 0.61 mg/dL (ref 0.61–1.24)
GFR, Estimated: >60 mL/min (ref >60)
Glucose, Bld: 121 mg/dL — ABNORMAL HIGH (ref 70–99)
Potassium: 4.2 mmol/L (ref 3.5–5.1)
Sodium: 131 mmol/L — ABNORMAL LOW (ref 135–145)
Total Bilirubin: 0.5 mg/dL (ref 0.3–1.2)
Total Protein: 7.2 g/dL (ref 6.5–8.1)

## 2020-08-30 LAB — URINALYSIS, ROUTINE W REFLEX MICROSCOPIC
Bilirubin Urine: NEGATIVE
Glucose, UA: NEGATIVE mg/dL
Hgb urine dipstick: NEGATIVE
Ketones, ur: NEGATIVE mg/dL
Leukocytes,Ua: NEGATIVE
Nitrite: NEGATIVE
Protein, ur: NEGATIVE mg/dL
Specific Gravity, Urine: 1.026 (ref 1.005–1.030)
pH: 5 (ref 5.0–8.0)

## 2020-08-30 LAB — BLOOD GAS, ARTERIAL
Acid-Base Excess: 4.7 mmol/L — ABNORMAL HIGH (ref 0.0–2.0)
Bicarbonate: 28.5 mmol/L — ABNORMAL HIGH (ref 20.0–28.0)
FIO2: 21
O2 Saturation: 97.3 %
Patient temperature: 37
pCO2 arterial: 40.1 mmHg (ref 32.0–48.0)
pH, Arterial: 7.465 — ABNORMAL HIGH (ref 7.350–7.450)
pO2, Arterial: 86.6 mmHg (ref 83.0–108.0)

## 2020-08-30 LAB — SARS CORONAVIRUS 2 (TAT 6-24 HRS): SARS Coronavirus 2: NEGATIVE

## 2020-08-30 LAB — TYPE AND SCREEN
ABO/RH(D): A POS
Antibody Screen: NEGATIVE

## 2020-08-30 LAB — APTT: aPTT: 35 seconds (ref 24–36)

## 2020-08-30 LAB — PROTIME-INR
INR: 0.9 (ref 0.8–1.2)
Prothrombin Time: 12.5 seconds (ref 11.4–15.2)

## 2020-08-30 NOTE — Progress Notes (Signed)
PCP - None Cardiologist - Dr. Levert Feinstein at [facility]  Chest x-ray - 08/30/20 EKG - 08/16/20 Stress Test - 07/20/20 ECHO - 04/30/19 Cardiac Cath - Denies  Sleep Study - Denies  DM - Denies  COVID TEST- 08/30/20   Anesthesia review: Yes cardiac history  Patient denies shortness of breath, fever, cough and chest pain at PAT appointment   All instructions explained to the patient, with a verbal understanding of the material. Patient agrees to go over the instructions while at home for a better understanding. Patient also instructed to wear a mask in public after being tested for COVID-19. The opportunity to ask questions was provided.

## 2020-08-30 NOTE — Progress Notes (Signed)
Surgical Instructions    Your procedure is scheduled on July 28th.  Report to [facility] Main Entrance "A" at 5:30 A.M., then check in with the Admitting office.  Call this number if you have problems the morning of surgery:  [phone]   If you have any questions prior to your surgery date call [phone]: Open Monday-Friday 8am-4pm    Remember:  Do not eat or drink anything after midnight the night before your surgery   Take these medicines the morning of surgery with A SIP OF WATER  gabapentin (NEURONTIN) 250 MG/5ML solution  IF NEEDED oxyCODONE (ROXICODONE) 5 MG/5ML solution polyvinyl alcohol (LIQUIFILM TEARS) 1.4 % ophthalmic solution   As of today, STOP taking any Aspirin (unless otherwise instructed by your surgeon) Voltaren, Aleve, Naproxen, Ibuprofen, Motrin, Advil, Goody's, BC's, all herbal medications, fish oil, and all vitamins.          Do not wear jewelry  Do not wear lotions, powders, colognes, or deodorant. Do not shave 48 hours prior to surgery.  Men may shave face and neck. Do not bring valuables to the hospital. DO Not wear nail polish, gel polish, artificial nails, or any other type of covering on  natural nails including finger and toenails. If patients have artificial nails, gel coating, etc. that need to be removed by a nail salon please have this removed prior to surgery or surgery may need to be canceled/delayed if the surgeon/ anesthesia feels like the patient is unable to be adequately monitored.             [facility] is not responsible for any belongings or valuables.  Do NOT Smoke (Tobacco/Vaping) or drink Alcohol 24 hours prior to your procedure If you use a CPAP at night, you may bring all equipment for your overnight stay.   Contacts, glasses, dentures or bridgework may not be worn into surgery, please bring cases for these belongings   For patients admitted to the hospital, discharge time will be determined by your treatment team.    Patients discharged the day of surgery will not be allowed to drive home, and someone needs to stay with them for 24 hours.  ONLY 1 SUPPORT PERSON MAY BE PRESENT WHILE YOU ARE IN SURGERY. IF YOU ARE TO BE ADMITTED ONCE YOU ARE IN YOUR ROOM YOU WILL BE ALLOWED TWO (2) VISITORS.  Minor children may have two parents present. Special consideration for safety and communication needs will be reviewed on a case by case basis.  Special instructions:    Oral Hygiene is also important to reduce your risk of infection.  Remember - BRUSH YOUR TEETH THE MORNING OF SURGERY WITH YOUR REGULAR TOOTHPASTE   [place]- Preparing For Surgery  Before surgery, you can play an important role. Because skin is not sterile, your skin needs to be as free of germs as possible. You can reduce the number of germs on your skin by washing with CHG (chlorahexidine gluconate) Soap before surgery.  CHG is an antiseptic cleaner which kills germs and bonds with the skin to continue killing germs even after washing.     Please do not use if you have an allergy to CHG or antibacterial soaps. If your skin becomes reddened/irritated stop using the CHG.  Do not shave (including legs and underarms) for at least 48 hours prior to first CHG shower. It is OK to shave your face.  Please follow these instructions carefully.     Shower the [employer] [facility] and the  MORNING OF SURGERY with CHG Soap.   If you chose to wash your hair, wash your hair first as usual with your normal shampoo. After you shampoo, rinse your hair and body thoroughly to remove the shampoo.  Then [employer] and genitals (private parts) with your normal soap and rinse thoroughly to remove soap.  After that Use CHG Soap as you would any other liquid soap. You can apply CHG directly to the skin and wash gently with a scrungie or a clean washcloth.   Apply the CHG Soap to your body ONLY FROM THE NECK DOWN.  Do not use on open wounds or open sores. Avoid contact  with your eyes, ears, mouth and genitals (private parts). Wash Face and genitals (private parts)  with your normal soap.   Wash thoroughly, paying special attention to the area where your surgery will be performed.  Thoroughly rinse your body with warm water from the neck down.  DO NOT shower/wash with your normal soap after using and rinsing off the CHG Soap.  Pat yourself dry with a CLEAN TOWEL.  Wear CLEAN PAJAMAS to bed the night before surgery  Place CLEAN SHEETS on your bed the night before your surgery  DO NOT SLEEP WITH PETS.   Day of Surgery:  Take a shower with CHG soap. Wear Clean/Comfortable clothing the morning of surgery Do not apply any deodorants/lotions.   Remember to brush your teeth WITH YOUR REGULAR TOOTHPASTE.   Please read over the following fact sheets that you were given.

## 2020-08-30 NOTE — Progress Notes (Signed)
Called [phone] and left message for Mr. Hobbs that we were expecting to see him for a 1 o'clock appointment today and I was wondering if he was still trying to make it to [place].  I asked him to call me back at [phone] or [phone].

## 2020-08-31 MED ORDER — SODIUM CHLORIDE 0.9 % IV SOLN
2.0000 g | INTRAVENOUS | Status: AC
  Administered 2020-09-01 (×5): 2 g via INTRAVENOUS
  Filled 2020-08-31 (×2): qty 2

## 2020-08-31 NOTE — Anesthesia Preprocedure Evaluation (Addendum)
Anesthesia Evaluation  Patient identified by MRN, date of birth, ID band Patient awake    Reviewed: Allergy & Precautions, NPO status   Airway Mallampati: II  TM Distance: >3 FB Neck ROM: Full    Dental  (+) Missing, Poor Dentition, Chipped, Loose, Dental Advisory Given   Pulmonary Current Smoker and Patient abstained from smoking.,     + decreased breath sounds      Cardiovascular hypertension, + CAD and + Peripheral Vascular Disease   Rhythm:Regular Rate:Normal  Gated Study: ? The left ventricular ejection fraction is normal (55-65%). ? Nuclear stress EF: 57%. ? There was no ST segment deviation noted during stress. ? No T wave inversion was noted during stress. ? The study is normal. ? This is a low risk study with no evidence of ischemia.  Echo: 1. Normal LV systolic function; grade 1 diastolic dysfunction.  2. Left ventricular ejection fraction, by estimation, is 60 to 65%. The  left ventricle has normal function. The left ventricle has no regional  wall motion abnormalities. Left ventricular diastolic parameters are  consistent with Grade I diastolic  dysfunction (impaired relaxation).  3. Right ventricular systolic function is normal. The right ventricular  size is normal.  4. The mitral valve is normal in structure. No evidence of mitral valve  regurgitation. No evidence of mitral stenosis.  5. The aortic valve was not well visualized. Aortic valve regurgitation  is not visualized. No aortic stenosis is present.  6. The inferior vena cava is normal in size with greater than 50%  respiratory variability, suggesting right atrial pressure of 3 mmHg.    Neuro/Psych PSYCHIATRIC DISORDERS Anxiety Depression CVA    GI/Hepatic Neg liver ROS, GERD  Medicated,  Endo/Other  negative endocrine ROS  Renal/GU negative Renal ROS     Musculoskeletal negative musculoskeletal ROS (+)   Abdominal Normal abdominal exam   (+)   Peds  Hematology negative hematology ROS (+)   Anesthesia Other Findings   Reproductive/Obstetrics                          Anesthesia Physical Anesthesia Plan  ASA: 3  Anesthesia Plan: General   Post-op Pain Management:    Induction: Intravenous  PONV Risk Score and Plan: 2 and Ondansetron and Midazolam  Airway Management Planned: Double Lumen EBT  Additional Equipment: Arterial line, CVP and Ultrasound Guidance Line Placement  Intra-op Plan:   Post-operative Plan: Possible Post-op intubation/ventilation  Informed Consent: I have reviewed the patients History and Physical, chart, labs and discussed the procedure including the risks, benefits and alternatives for the proposed anesthesia with the patient or authorized representative who has indicated his/her understanding and acceptance.     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  Recent echo 04/30/2019 showed EF 60 to 65%, grade 1 DD, normal RV function, normal valves.  Dr. Cliffton Asters ordered preoperative nuclear stress test which was done 07/20/2020 and showed no evidence of ischemia, low risk study.  Preop labs reviewed, mild hyponatremia sodium 131, otherwise unremarkable.  EKG 08/16/2020: Sinus bradycardia.  Rate 55. Minimal voltage criteria for LVH, may be normal variant  CHEST - 2 VIEW 08/30/2020:  COMPARISON: 08/16/2020. 05/23/2020. CT 08/25/2019.  FINDINGS: Mediastinum and hilar structures normal. Heart size  normal. COPD. No acute infiltrate. No pleural effusion or pneumothorax. Stable mild elevation left hemidiaphragm. Degenerative change thoracic spine. Abnormality.  IMPRESSION: COPD. Stable mild elevation left hemidiaphragm.  No acute cardiopulmonary disease.  Nuclear stress 07/20/2020: . The left ventricular ejection fraction is normal (55-65%). . Nuclear stress EF: 57%. . There was no ST segment deviation noted during stress. Marland Kitchen No T wave inversion was noted during stress. . The study is normal. . This is a low risk study with no evidence of ischemia.  TTE 04/30/2019: 1. Normal LV systolic function; grade 1 diastolic dysfunction.  2. Left ventricular ejection fraction, by estimation, is 60 to 65%. The  left ventricle has normal function. The left ventricle has no regional  wall motion abnormalities. Left ventricular diastolic parameters are  consistent with Grade I diastolic  dysfunction (impaired relaxation).  3. Right ventricular systolic function is normal. The right ventricular  size is normal.  4. The mitral valve is normal in structure. No evidence of mitral valve  regurgitation. No evidence of mitral stenosis.  5. The aortic valve was not well visualized. Aortic valve regurgitation  is not visualized. No aortic stenosis is present.  6. The inferior vena cava is normal in size with greater than 50%  respiratory variability, suggesting right atrial pressure of 3 mmHg.  )      Anesthesia Quick Evaluation

## 2020-08-31 NOTE — Progress Notes (Signed)
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  Recent echo 04/30/2019 showed EF 60 to 65%, grade 1 DD, normal RV function, normal valves.  Dr. Cliffton Asters ordered preoperative nuclear stress test which was done 07/20/2020 and showed no evidence of ischemia, low risk study.  Preop labs reviewed, mild hyponatremia sodium 131, otherwise unremarkable.  EKG 08/16/2020: Sinus bradycardia.  Rate 55. Minimal voltage criteria for LVH, may be normal variant  CHEST - 2 VIEW 08/30/2020:   COMPARISON:  08/16/2020.  05/23/2020.  CT 08/25/2019.   FINDINGS: Mediastinum and hilar structures normal. Heart size normal. COPD. No acute infiltrate. No pleural effusion or pneumothorax. Stable mild elevation left hemidiaphragm. Degenerative change thoracic spine. Abnormality.   IMPRESSION: COPD. Stable mild elevation left hemidiaphragm. No acute cardiopulmonary disease.  Nuclear stress 07/20/2020: The left ventricular ejection fraction is normal (55-65%). Nuclear stress EF: 57%. There was no ST segment deviation noted during stress. No T wave inversion was noted during stress. The study is normal. This is a low risk study with no evidence of ischemia.  TTE 04/30/2019:  1. Normal LV systolic function; grade 1 diastolic dysfunction.   2. Left ventricular ejection fraction, by estimation, is 60 to 65%. The  left ventricle has normal function. The left ventricle has no regional  wall motion abnormalities. Left ventricular diastolic parameters are  consistent with Grade I diastolic  dysfunction (impaired relaxation).   3. Right ventricular systolic function is normal. The right ventricular  size is normal.   4. The mitral valve is normal in structure. No evidence of mitral valve  regurgitation. No evidence of mitral stenosis.   5. The aortic valve  was not well visualized. Aortic valve regurgitation  is not visualized. No aortic stenosis is present.   6. The inferior vena cava is normal in size with greater than 50%  respiratory variability, suggesting right atrial pressure of 3 mmHg.   Zannie Cove [facility] Short Stay Center/Anesthesiology Phone [phone] 08/31/2020 9:48 AM

## 2020-09-01 ENCOUNTER — Inpatient Hospital Stay (HOSPITAL_COMMUNITY): Payer: Medicaid Other | Admitting: Certified Registered Nurse Anesthetist

## 2020-09-01 ENCOUNTER — Encounter (HOSPITAL_COMMUNITY)
Admission: RE | Disposition: A | Discharge status: Skilled Nursing Facility | Payer: Self-pay | Source: Home / Self Care | Attending: Thoracic Surgery (Cardiothoracic Vascular Surgery)

## 2020-09-01 ENCOUNTER — Inpatient Hospital Stay (HOSPITAL_COMMUNITY)
Admission: RE | Admit: 2020-09-01 | Discharge: 2020-10-25 | DRG: 003 | Disposition: A | Discharge status: Skilled Nursing Facility | Payer: Medicaid Other | Attending: Thoracic Surgery (Cardiothoracic Vascular Surgery) | Admitting: Thoracic Surgery (Cardiothoracic Vascular Surgery)

## 2020-09-01 ENCOUNTER — Encounter (HOSPITAL_COMMUNITY): Payer: Self-pay | Admitting: Thoracic Surgery (Cardiothoracic Vascular Surgery)

## 2020-09-01 ENCOUNTER — Other Ambulatory Visit: Payer: Self-pay

## 2020-09-01 ENCOUNTER — Inpatient Hospital Stay (HOSPITAL_COMMUNITY): Payer: Medicaid Other | Admitting: Physician Assistant

## 2020-09-01 ENCOUNTER — Inpatient Hospital Stay (HOSPITAL_COMMUNITY): Payer: Medicaid Other

## 2020-09-01 DIAGNOSIS — Y95 Nosocomial condition: Secondary | ICD-10-CM | POA: Diagnosis not present

## 2020-09-01 DIAGNOSIS — D75838 Other thrombocytosis: Secondary | ICD-10-CM | POA: Diagnosis not present

## 2020-09-01 DIAGNOSIS — R001 Bradycardia, unspecified: Secondary | ICD-10-CM | POA: Diagnosis not present

## 2020-09-01 DIAGNOSIS — R9389 Abnormal findings on diagnostic imaging of other specified body structures: Secondary | ICD-10-CM

## 2020-09-01 DIAGNOSIS — D638 Anemia in other chronic diseases classified elsewhere: Secondary | ICD-10-CM | POA: Diagnosis not present

## 2020-09-01 DIAGNOSIS — F419 Anxiety disorder, unspecified: Secondary | ICD-10-CM | POA: Diagnosis present

## 2020-09-01 DIAGNOSIS — R6521 Severe sepsis with septic shock: Secondary | ICD-10-CM | POA: Diagnosis not present

## 2020-09-01 DIAGNOSIS — I952 Hypotension due to drugs: Secondary | ICD-10-CM | POA: Diagnosis not present

## 2020-09-01 DIAGNOSIS — J9602 Acute respiratory failure with hypercapnia: Secondary | ICD-10-CM | POA: Diagnosis not present

## 2020-09-01 DIAGNOSIS — K223 Perforation of esophagus: Secondary | ICD-10-CM | POA: Diagnosis not present

## 2020-09-01 DIAGNOSIS — G522 Disorders of vagus nerve: Secondary | ICD-10-CM | POA: Diagnosis not present

## 2020-09-01 DIAGNOSIS — Z9689 Presence of other specified functional implants: Secondary | ICD-10-CM | POA: Diagnosis not present

## 2020-09-01 DIAGNOSIS — E43 Unspecified severe protein-calorie malnutrition: Secondary | ICD-10-CM | POA: Diagnosis not present

## 2020-09-01 DIAGNOSIS — R739 Hyperglycemia, unspecified: Secondary | ICD-10-CM | POA: Diagnosis not present

## 2020-09-01 DIAGNOSIS — J95821 Acute postprocedural respiratory failure: Secondary | ICD-10-CM | POA: Diagnosis not present

## 2020-09-01 DIAGNOSIS — I469 Cardiac arrest, cause unspecified: Secondary | ICD-10-CM | POA: Diagnosis not present

## 2020-09-01 DIAGNOSIS — Z781 Physical restraint status: Secondary | ICD-10-CM

## 2020-09-01 DIAGNOSIS — K5900 Constipation, unspecified: Secondary | ICD-10-CM

## 2020-09-01 DIAGNOSIS — N179 Acute kidney failure, unspecified: Secondary | ICD-10-CM | POA: Diagnosis not present

## 2020-09-01 DIAGNOSIS — D62 Acute posthemorrhagic anemia: Secondary | ICD-10-CM | POA: Diagnosis not present

## 2020-09-01 DIAGNOSIS — J869 Pyothorax without fistula: Secondary | ICD-10-CM

## 2020-09-01 DIAGNOSIS — K9189 Other postprocedural complications and disorders of digestive system: Secondary | ICD-10-CM | POA: Diagnosis not present

## 2020-09-01 DIAGNOSIS — Y733 Surgical instruments, materials and gastroenterology and urology devices (including sutures) associated with adverse incidents: Secondary | ICD-10-CM | POA: Diagnosis not present

## 2020-09-01 DIAGNOSIS — E871 Hypo-osmolality and hyponatremia: Secondary | ICD-10-CM | POA: Diagnosis not present

## 2020-09-01 DIAGNOSIS — J95812 Postprocedural air leak: Secondary | ICD-10-CM | POA: Diagnosis not present

## 2020-09-01 DIAGNOSIS — J9859 Other diseases of mediastinum, not elsewhere classified: Secondary | ICD-10-CM | POA: Diagnosis present

## 2020-09-01 DIAGNOSIS — R54 Age-related physical debility: Secondary | ICD-10-CM | POA: Diagnosis present

## 2020-09-01 DIAGNOSIS — J189 Pneumonia, unspecified organism: Secondary | ICD-10-CM

## 2020-09-01 DIAGNOSIS — Z930 Tracheostomy status: Secondary | ICD-10-CM

## 2020-09-01 DIAGNOSIS — E872 Acidosis: Secondary | ICD-10-CM | POA: Diagnosis not present

## 2020-09-01 DIAGNOSIS — J9601 Acute respiratory failure with hypoxia: Secondary | ICD-10-CM

## 2020-09-01 DIAGNOSIS — Z681 Body mass index (BMI) 19 or less, adult: Secondary | ICD-10-CM

## 2020-09-01 DIAGNOSIS — A419 Sepsis, unspecified organism: Secondary | ICD-10-CM | POA: Diagnosis not present

## 2020-09-01 DIAGNOSIS — J99 Respiratory disorders in diseases classified elsewhere: Secondary | ICD-10-CM | POA: Diagnosis not present

## 2020-09-01 DIAGNOSIS — L89152 Pressure ulcer of sacral region, stage 2: Secondary | ICD-10-CM | POA: Diagnosis not present

## 2020-09-01 DIAGNOSIS — Y832 Surgical operation with anastomosis, bypass or graft as the cause of abnormal reaction of the patient, or of later complication, without mention of misadventure at the time of the procedure: Secondary | ICD-10-CM | POA: Diagnosis not present

## 2020-09-01 DIAGNOSIS — J939 Pneumothorax, unspecified: Secondary | ICD-10-CM

## 2020-09-01 DIAGNOSIS — F32A Depression, unspecified: Secondary | ICD-10-CM | POA: Diagnosis present

## 2020-09-01 DIAGNOSIS — K9423 Gastrostomy malfunction: Secondary | ICD-10-CM | POA: Diagnosis not present

## 2020-09-01 DIAGNOSIS — Z20822 Contact with and (suspected) exposure to covid-19: Secondary | ICD-10-CM | POA: Diagnosis present

## 2020-09-01 DIAGNOSIS — J151 Pneumonia due to Pseudomonas: Secondary | ICD-10-CM | POA: Diagnosis not present

## 2020-09-01 DIAGNOSIS — E162 Hypoglycemia, unspecified: Secondary | ICD-10-CM | POA: Diagnosis not present

## 2020-09-01 DIAGNOSIS — I442 Atrioventricular block, complete: Secondary | ICD-10-CM

## 2020-09-01 DIAGNOSIS — L309 Dermatitis, unspecified: Secondary | ICD-10-CM | POA: Diagnosis not present

## 2020-09-01 DIAGNOSIS — Z9889 Other specified postprocedural states: Secondary | ICD-10-CM | POA: Diagnosis not present

## 2020-09-01 DIAGNOSIS — J90 Pleural effusion, not elsewhere classified: Secondary | ICD-10-CM

## 2020-09-01 DIAGNOSIS — I2510 Atherosclerotic heart disease of native coronary artery without angina pectoris: Secondary | ICD-10-CM | POA: Diagnosis present

## 2020-09-01 DIAGNOSIS — R64 Cachexia: Secondary | ICD-10-CM | POA: Diagnosis present

## 2020-09-01 DIAGNOSIS — F17210 Nicotine dependence, cigarettes, uncomplicated: Secondary | ICD-10-CM | POA: Diagnosis present

## 2020-09-01 DIAGNOSIS — L8990 Pressure ulcer of unspecified site, unspecified stage: Secondary | ICD-10-CM | POA: Insufficient documentation

## 2020-09-01 DIAGNOSIS — I252 Old myocardial infarction: Secondary | ICD-10-CM

## 2020-09-01 DIAGNOSIS — Z9049 Acquired absence of other specified parts of digestive tract: Secondary | ICD-10-CM | POA: Diagnosis not present

## 2020-09-01 DIAGNOSIS — Z430 Encounter for attention to tracheostomy: Secondary | ICD-10-CM | POA: Diagnosis not present

## 2020-09-01 DIAGNOSIS — Z8673 Personal history of transient ischemic attack (TIA), and cerebral infarction without residual deficits: Secondary | ICD-10-CM

## 2020-09-01 DIAGNOSIS — J9600 Acute respiratory failure, unspecified whether with hypoxia or hypercapnia: Secondary | ICD-10-CM | POA: Diagnosis not present

## 2020-09-01 DIAGNOSIS — K449 Diaphragmatic hernia without obstruction or gangrene: Secondary | ICD-10-CM | POA: Diagnosis present

## 2020-09-01 DIAGNOSIS — G709 Myoneural disorder, unspecified: Secondary | ICD-10-CM | POA: Diagnosis not present

## 2020-09-01 DIAGNOSIS — K660 Peritoneal adhesions (postprocedural) (postinfection): Secondary | ICD-10-CM | POA: Diagnosis present

## 2020-09-01 DIAGNOSIS — I472 Ventricular tachycardia: Secondary | ICD-10-CM | POA: Diagnosis not present

## 2020-09-01 DIAGNOSIS — R109 Unspecified abdominal pain: Secondary | ICD-10-CM

## 2020-09-01 DIAGNOSIS — K9413 Enterostomy malfunction: Secondary | ICD-10-CM | POA: Diagnosis not present

## 2020-09-01 DIAGNOSIS — G8929 Other chronic pain: Secondary | ICD-10-CM | POA: Diagnosis present

## 2020-09-01 DIAGNOSIS — R069 Unspecified abnormalities of breathing: Secondary | ICD-10-CM

## 2020-09-01 DIAGNOSIS — J9690 Respiratory failure, unspecified, unspecified whether with hypoxia or hypercapnia: Secondary | ICD-10-CM

## 2020-09-01 DIAGNOSIS — E876 Hypokalemia: Secondary | ICD-10-CM | POA: Diagnosis not present

## 2020-09-01 DIAGNOSIS — Z8249 Family history of ischemic heart disease and other diseases of the circulatory system: Secondary | ICD-10-CM

## 2020-09-01 DIAGNOSIS — K222 Esophageal obstruction: Principal | ICD-10-CM

## 2020-09-01 DIAGNOSIS — R5381 Other malaise: Secondary | ICD-10-CM | POA: Diagnosis not present

## 2020-09-01 DIAGNOSIS — Z978 Presence of other specified devices: Secondary | ICD-10-CM | POA: Diagnosis not present

## 2020-09-01 DIAGNOSIS — Z79899 Other long term (current) drug therapy: Secondary | ICD-10-CM

## 2020-09-01 DIAGNOSIS — Z9911 Dependence on respirator [ventilator] status: Secondary | ICD-10-CM | POA: Diagnosis not present

## 2020-09-01 DIAGNOSIS — T8182XA Emphysema (subcutaneous) resulting from a procedure, initial encounter: Secondary | ICD-10-CM | POA: Diagnosis not present

## 2020-09-01 HISTORY — PX: ESOPHAGOGASTRODUODENOSCOPY: SHX5428

## 2020-09-01 HISTORY — PX: INTERCOSTAL NERVE BLOCK: SHX5021

## 2020-09-01 LAB — CBC
HCT: 35.1 % — ABNORMAL LOW (ref 39.0–52.0)
Hemoglobin: 11.6 g/dL — ABNORMAL LOW (ref 13.0–17.0)
MCH: 31.3 pg (ref 26.0–34.0)
MCHC: 33 g/dL (ref 30.0–36.0)
MCV: 94.6 fL (ref 80.0–100.0)
Platelets: 204 10*3/uL (ref 150–400)
RBC: 3.71 MIL/uL — ABNORMAL LOW (ref 4.22–5.81)
RDW: 13.4 % (ref 11.5–15.5)
WBC: 10.9 10*3/uL — ABNORMAL HIGH (ref 4.0–10.5)
nRBC: 0 % (ref 0.0–0.2)

## 2020-09-01 LAB — POCT I-STAT 7, (LYTES, BLD GAS, ICA,H+H)
Acid-Base Excess: 0 mmol/L (ref 0.0–2.0)
Acid-Base Excess: 0 mmol/L (ref 0.0–2.0)
Acid-Base Excess: 0 mmol/L (ref 0.0–2.0)
Acid-Base Excess: 0 mmol/L (ref 0.0–2.0)
Acid-Base Excess: 1 mmol/L (ref 0.0–2.0)
Bicarbonate: 27.7 mmol/L (ref 20.0–28.0)
Bicarbonate: 26.6 mmol/L (ref 20.0–28.0)
Bicarbonate: 25.7 mmol/L (ref 20.0–28.0)
Bicarbonate: 26.2 mmol/L (ref 20.0–28.0)
Bicarbonate: 27 mmol/L (ref 20.0–28.0)
Calcium, Ion: 1.32 mmol/L (ref 1.15–1.40)
Calcium, Ion: 1.24 mmol/L (ref 1.15–1.40)
Calcium, Ion: 1.24 mmol/L (ref 1.15–1.40)
Calcium, Ion: 1.27 mmol/L (ref 1.15–1.40)
Calcium, Ion: 1.28 mmol/L (ref 1.15–1.40)
HCT: 41 % (ref 39.0–52.0)
HCT: 38 % — ABNORMAL LOW (ref 39.0–52.0)
HCT: 34 % — ABNORMAL LOW (ref 39.0–52.0)
HCT: 35 % — ABNORMAL LOW (ref 39.0–52.0)
HCT: 35 % — ABNORMAL LOW (ref 39.0–52.0)
Hemoglobin: 13.9 g/dL (ref 13.0–17.0)
Hemoglobin: 12.9 g/dL — ABNORMAL LOW (ref 13.0–17.0)
Hemoglobin: 11.6 g/dL — ABNORMAL LOW (ref 13.0–17.0)
Hemoglobin: 11.9 g/dL — ABNORMAL LOW (ref 13.0–17.0)
Hemoglobin: 11.9 g/dL — ABNORMAL LOW (ref 13.0–17.0)
O2 Saturation: 96 %
O2 Saturation: 96 %
O2 Saturation: 88 %
O2 Saturation: 99 %
O2 Saturation: 100 %
Patient temperature: 35
Patient temperature: 35
Patient temperature: 35.4
Patient temperature: 35.6
Patient temperature: 35.8
Potassium: 4.1 mmol/L (ref 3.5–5.1)
Potassium: 4 mmol/L (ref 3.5–5.1)
Potassium: 4.1 mmol/L (ref 3.5–5.1)
Potassium: 4.3 mmol/L (ref 3.5–5.1)
Potassium: 4.4 mmol/L (ref 3.5–5.1)
Sodium: 134 mmol/L — ABNORMAL LOW (ref 135–145)
Sodium: 135 mmol/L (ref 135–145)
Sodium: 135 mmol/L (ref 135–145)
Sodium: 136 mmol/L (ref 135–145)
Sodium: 135 mmol/L (ref 135–145)
TCO2: 29 mmol/L (ref 22–32)
TCO2: 28 mmol/L (ref 22–32)
TCO2: 27 mmol/L (ref 22–32)
TCO2: 28 mmol/L (ref 22–32)
TCO2: 28 mmol/L (ref 22–32)
pCO2 arterial: 50.7 mmHg — ABNORMAL HIGH (ref 32.0–48.0)
pCO2 arterial: 44 mmHg (ref 32.0–48.0)
pCO2 arterial: 41 mmHg (ref 32.0–48.0)
pCO2 arterial: 44.4 mmHg (ref 32.0–48.0)
pCO2 arterial: 46.9 mmHg (ref 32.0–48.0)
pH, Arterial: 7.335 — ABNORMAL LOW (ref 7.350–7.450)
pH, Arterial: 7.38 (ref 7.350–7.450)
pH, Arterial: 7.398 (ref 7.350–7.450)
pH, Arterial: 7.373 (ref 7.350–7.450)
pH, Arterial: 7.362 (ref 7.350–7.450)
pO2, Arterial: 79 mmHg — ABNORMAL LOW (ref 83.0–108.0)
pO2, Arterial: 74 mmHg — ABNORMAL LOW (ref 83.0–108.0)
pO2, Arterial: 50 mmHg — ABNORMAL LOW (ref 83.0–108.0)
pO2, Arterial: 132 mmHg — ABNORMAL HIGH (ref 83.0–108.0)
pO2, Arterial: 196 mmHg — ABNORMAL HIGH (ref 83.0–108.0)

## 2020-09-01 LAB — BASIC METABOLIC PANEL
Anion gap: 7 (ref 5–15)
BUN: 11 mg/dL (ref 6–20)
CO2: 24 mmol/L (ref 22–32)
Calcium: 9 mg/dL (ref 8.9–10.3)
Chloride: 102 mmol/L (ref 98–111)
Creatinine, Ser: 0.78 mg/dL (ref 0.61–1.24)
GFR, Estimated: >60 mL/min (ref >60)
Glucose, Bld: 131 mg/dL — ABNORMAL HIGH (ref 70–99)
Potassium: 4 mmol/L (ref 3.5–5.1)
Sodium: 133 mmol/L — ABNORMAL LOW (ref 135–145)

## 2020-09-01 LAB — BLOOD GAS, ARTERIAL
Acid-Base Excess: 1.6 mmol/L (ref 0.0–2.0)
Bicarbonate: 25.5 mmol/L (ref 20.0–28.0)
Drawn by: 252031
FIO2: 21
O2 Saturation: 93.4 %
Patient temperature: 37
pCO2 arterial: 38.6 mmHg (ref 32.0–48.0)
pH, Arterial: 7.435 (ref 7.350–7.450)
pO2, Arterial: 66 mmHg — ABNORMAL LOW (ref 83.0–108.0)

## 2020-09-01 LAB — GLUCOSE, CAPILLARY
Glucose-Capillary: 116 mg/dL — ABNORMAL HIGH (ref 70–99)
Glucose-Capillary: 117 mg/dL — ABNORMAL HIGH (ref 70–99)

## 2020-09-01 SURGERY — ESOPHAGECTOMY, ROBOT-ASSISTED
Anesthesia: General | Site: Chest | Laterality: Right

## 2020-09-01 MED ORDER — SODIUM CHLORIDE 0.9 % IV SOLN
2.0000 g | Freq: Four times a day (QID) | INTRAVENOUS | Status: AC
  Administered 2020-09-01 – 2020-09-02 (×3): 2 g via INTRAVENOUS
  Filled 2020-09-01 (×3): qty 2

## 2020-09-01 MED ORDER — SODIUM CHLORIDE 0.9 % IV SOLN
2.0000 g | INTRAVENOUS | Status: DC
  Filled 2020-09-01 (×4): qty 2

## 2020-09-01 MED ORDER — DEXMEDETOMIDINE (PRECEDEX) IN NS 20 MCG/5ML (4 MCG/ML) IV SYRINGE
PREFILLED_SYRINGE | INTRAVENOUS | Status: DC | PRN
  Administered 2020-09-01 (×2): 8 ug via INTRAVENOUS
  Administered 2020-09-01: 4 ug via INTRAVENOUS

## 2020-09-01 MED ORDER — JEVITY 1.2 CAL PO LIQD
10.0000 mL | ORAL | Status: DC
  Administered 2020-09-01: 10 mL
  Filled 2020-09-01: qty 237

## 2020-09-01 MED ORDER — INSULIN ASPART 100 UNIT/ML IJ SOLN
0.0000 [IU] | INTRAMUSCULAR | Status: DC
  Administered 2020-09-06: 8 [IU] via SUBCUTANEOUS
  Administered 2020-09-08: 2 [IU] via SUBCUTANEOUS
  Administered 2020-09-08: 4 [IU] via SUBCUTANEOUS
  Administered 2020-09-09 – 2020-09-25 (×41): 2 [IU] via SUBCUTANEOUS

## 2020-09-01 MED ORDER — KETOROLAC TROMETHAMINE 30 MG/ML IJ SOLN
INTRAMUSCULAR | Status: DC | PRN
  Administered 2020-09-01: 30 mg via INTRAVENOUS

## 2020-09-01 MED ORDER — ORAL CARE MOUTH RINSE: 15.0000 mL | Freq: Once | OROMUCOSAL | Status: AC

## 2020-09-01 MED ORDER — LACTATED RINGERS IV SOLN: INTRAVENOUS | Status: DC

## 2020-09-01 MED ORDER — DEXMEDETOMIDINE HCL IN NACL 400 MCG/100ML IV SOLN: 0.0000 ug/kg/h | INTRAVENOUS | Status: DC

## 2020-09-01 MED ORDER — CHLORHEXIDINE GLUCONATE 0.12 % MT SOLN
OROMUCOSAL | Status: AC
  Administered 2020-09-01: 15 mL via OROMUCOSAL
  Filled 2020-09-01: qty 15

## 2020-09-01 MED ORDER — KETAMINE HCL 10 MG/ML IJ SOLN
INTRAMUSCULAR | Status: DC | PRN
  Administered 2020-09-01 (×3): 10 mg via INTRAVENOUS

## 2020-09-01 MED ORDER — PHENYLEPHRINE 40 MCG/ML (10ML) SYRINGE FOR IV PUSH (FOR BLOOD PRESSURE SUPPORT)
PREFILLED_SYRINGE | INTRAVENOUS | Status: AC
  Filled 2020-09-01: qty 10

## 2020-09-01 MED ORDER — CHLORHEXIDINE GLUCONATE CLOTH 2 % EX PADS
6.0000 | MEDICATED_PAD | Freq: Every day | CUTANEOUS | Status: DC
  Administered 2020-09-01 – 2020-10-19 (×52): 6 via TOPICAL

## 2020-09-01 MED ORDER — ONDANSETRON HCL 4 MG/2ML IJ SOLN
INTRAMUSCULAR | Status: AC
  Filled 2020-09-01: qty 2

## 2020-09-01 MED ORDER — HYDROCODONE-ACETAMINOPHEN 7.5-325 MG/15ML PO SOLN
10.0000 mL | Freq: Four times a day (QID) | ORAL | Status: DC | PRN
  Administered 2020-09-02: 10 mL
  Filled 2020-09-01: qty 15

## 2020-09-01 MED ORDER — PHENYLEPHRINE 40 MCG/ML (10ML) SYRINGE FOR IV PUSH (FOR BLOOD PRESSURE SUPPORT)
PREFILLED_SYRINGE | INTRAVENOUS | Status: DC | PRN
  Administered 2020-09-01: 120 ug via INTRAVENOUS
  Administered 2020-09-01: 80 ug via INTRAVENOUS

## 2020-09-01 MED ORDER — ROCURONIUM BROMIDE 10 MG/ML (PF) SYRINGE
PREFILLED_SYRINGE | INTRAVENOUS | Status: DC | PRN
  Administered 2020-09-01: 30 mg via INTRAVENOUS
  Administered 2020-09-01: 60 mg via INTRAVENOUS
  Administered 2020-09-01: 20 mg via INTRAVENOUS
  Administered 2020-09-01: 40 mg via INTRAVENOUS
  Administered 2020-09-01: 20 mg via INTRAVENOUS
  Administered 2020-09-01: 30 mg via INTRAVENOUS
  Administered 2020-09-01: 10 mg via INTRAVENOUS
  Administered 2020-09-01 (×2): 20 mg via INTRAVENOUS

## 2020-09-01 MED ORDER — GABAPENTIN 250 MG/5ML PO SOLN
300.0000 mg | Freq: Three times a day (TID) | ORAL | Status: DC
  Administered 2020-09-02 – 2020-09-06 (×14): 300 mg
  Filled 2020-09-01 (×19): qty 6

## 2020-09-01 MED ORDER — ACETAMINOPHEN 10 MG/ML IV SOLN
INTRAVENOUS | Status: DC | PRN
  Administered 2020-09-01: 1000 mg via INTRAVENOUS

## 2020-09-01 MED ORDER — DEXAMETHASONE SODIUM PHOSPHATE 10 MG/ML IJ SOLN
INTRAMUSCULAR | Status: AC
  Filled 2020-09-01: qty 1

## 2020-09-01 MED ORDER — LIDOCAINE 2% (20 MG/ML) 5 ML SYRINGE
INTRAMUSCULAR | Status: DC | PRN
Start: 2020-09-01 — End: 2020-09-01
  Administered 2020-09-01: 40 mg via INTRAVENOUS

## 2020-09-01 MED ORDER — 0.9 % SODIUM CHLORIDE (POUR BTL) OPTIME
TOPICAL | Status: DC | PRN
  Administered 2020-09-01: 2000 mL

## 2020-09-01 MED ORDER — EPINEPHRINE PF 1 MG/ML IJ SOLN
INTRAMUSCULAR | Status: AC
  Filled 2020-09-01: qty 1

## 2020-09-01 MED ORDER — EPHEDRINE 5 MG/ML INJ
INTRAVENOUS | Status: AC
  Filled 2020-09-01: qty 5

## 2020-09-01 MED ORDER — ARTIFICIAL TEARS OPHTHALMIC OINT
TOPICAL_OINTMENT | OPHTHALMIC | Status: DC | PRN
  Administered 2020-09-01: 1 via OPHTHALMIC

## 2020-09-01 MED ORDER — BUPIVACAINE HCL 0.25 % IJ SOLN
INTRAMUSCULAR | Status: DC | PRN
  Administered 2020-09-01: 30 mL

## 2020-09-01 MED ORDER — BUPIVACAINE HCL (PF) 0.5 % IJ SOLN
INTRAMUSCULAR | Status: AC
  Filled 2020-09-01: qty 30

## 2020-09-01 MED ORDER — ALBUTEROL SULFATE (2.5 MG/3ML) 0.083% IN NEBU
INHALATION_SOLUTION | RESPIRATORY_TRACT | Status: AC
  Administered 2020-09-01: 2.5 mg
  Filled 2020-09-01: qty 3

## 2020-09-01 MED ORDER — ONDANSETRON HCL 4 MG/2ML IJ SOLN
4.0000 mg | INTRAMUSCULAR | Status: DC | PRN
  Administered 2020-09-01 – 2020-09-02 (×2): 4 mg via INTRAVENOUS
  Filled 2020-09-01: qty 2

## 2020-09-01 MED ORDER — ARTIFICIAL TEARS OPHTHALMIC OINT
TOPICAL_OINTMENT | OPHTHALMIC | Status: AC
  Filled 2020-09-01: qty 3.5

## 2020-09-01 MED ORDER — SODIUM CHLORIDE FLUSH 0.9 % IV SOLN
INTRAVENOUS | Status: DC | PRN
  Administered 2020-09-01: 100 mL

## 2020-09-01 MED ORDER — KETOROLAC TROMETHAMINE 30 MG/ML IJ SOLN
30.0000 mg | Freq: Four times a day (QID) | INTRAMUSCULAR | Status: AC
  Administered 2020-09-01 – 2020-09-06 (×18): 30 mg via INTRAVENOUS
  Filled 2020-09-01 (×18): qty 1

## 2020-09-01 MED ORDER — FENTANYL CITRATE (PF) 250 MCG/5ML IJ SOLN
INTRAMUSCULAR | Status: DC | PRN
  Administered 2020-09-01 (×9): 50 ug via INTRAVENOUS

## 2020-09-01 MED ORDER — ACETAMINOPHEN 160 MG/5ML PO SOLN
650.0000 mg | Freq: Four times a day (QID) | ORAL | Status: DC
  Administered 2020-09-01 – 2020-09-06 (×20): 650 mg
  Filled 2020-09-01 (×20): qty 20.3

## 2020-09-01 MED ORDER — KETAMINE HCL 50 MG/5ML IJ SOSY
PREFILLED_SYRINGE | INTRAMUSCULAR | Status: AC
  Filled 2020-09-01: qty 5

## 2020-09-01 MED ORDER — SUGAMMADEX SODIUM 200 MG/2ML IV SOLN
INTRAVENOUS | Status: DC | PRN
  Administered 2020-09-01 (×2): 100 mg via INTRAVENOUS

## 2020-09-01 MED ORDER — MORPHINE SULFATE (PF) 2 MG/ML IV SOLN
1.0000 mg | INTRAVENOUS | Status: DC | PRN
  Administered 2020-09-01 – 2020-09-02 (×2): 2 mg via INTRAVENOUS
  Administered 2020-09-02 (×2): 4 mg via INTRAVENOUS
  Administered 2020-09-02: 2 mg via INTRAVENOUS
  Administered 2020-09-02 (×2): 1 mg via INTRAVENOUS
  Administered 2020-09-02: 4 mg via INTRAVENOUS
  Administered 2020-09-02: 1 mg via INTRAVENOUS
  Filled 2020-09-01: qty 1
  Filled 2020-09-01 (×2): qty 2
  Filled 2020-09-01 (×5): qty 1
  Filled 2020-09-01: qty 2

## 2020-09-01 MED ORDER — MORPHINE SULFATE (PF) 2 MG/ML IV SOLN
INTRAVENOUS | Status: AC
  Administered 2020-09-01: 2 mg via INTRAVENOUS
  Filled 2020-09-01: qty 1

## 2020-09-01 MED ORDER — CHLORHEXIDINE GLUCONATE 0.12 % MT SOLN
15.0000 mL | OROMUCOSAL | Status: AC
  Administered 2020-09-01: 15 mL via OROMUCOSAL
  Filled 2020-09-01: qty 15

## 2020-09-01 MED ORDER — LACTATED RINGERS IV SOLN: INTRAVENOUS | Status: DC | PRN

## 2020-09-01 MED ORDER — MIDAZOLAM HCL 2 MG/2ML IJ SOLN
INTRAMUSCULAR | Status: AC
  Filled 2020-09-01: qty 2

## 2020-09-01 MED ORDER — ROCURONIUM BROMIDE 10 MG/ML (PF) SYRINGE
PREFILLED_SYRINGE | INTRAVENOUS | Status: AC
  Filled 2020-09-01: qty 10

## 2020-09-01 MED ORDER — INDOCYANINE GREEN 25 MG IV SOLR
INTRAVENOUS | Status: AC
  Filled 2020-09-01: qty 10

## 2020-09-01 MED ORDER — LIDOCAINE 2% (20 MG/ML) 5 ML SYRINGE
INTRAMUSCULAR | Status: AC
  Filled 2020-09-01: qty 5

## 2020-09-01 MED ORDER — SUCCINYLCHOLINE CHLORIDE 200 MG/10ML IV SOSY
PREFILLED_SYRINGE | INTRAVENOUS | Status: DC | PRN
  Administered 2020-09-01: 160 mg via INTRAVENOUS

## 2020-09-01 MED ORDER — DEXAMETHASONE SODIUM PHOSPHATE 10 MG/ML IJ SOLN
INTRAMUSCULAR | Status: DC | PRN
  Administered 2020-09-01: 10 mg via INTRAVENOUS

## 2020-09-01 MED ORDER — ACETAMINOPHEN 10 MG/ML IV SOLN
INTRAVENOUS | Status: AC
  Filled 2020-09-01: qty 100

## 2020-09-01 MED ORDER — PANTOPRAZOLE SODIUM 40 MG PO PACK
40.0000 mg | PACK | Freq: Two times a day (BID) | ORAL | Status: DC
  Administered 2020-09-02 – 2020-09-06 (×9): 40 mg
  Filled 2020-09-01 (×8): qty 20

## 2020-09-01 MED ORDER — ONDANSETRON HCL 4 MG/2ML IJ SOLN
INTRAMUSCULAR | Status: DC | PRN
  Administered 2020-09-01: 4 mg via INTRAVENOUS

## 2020-09-01 MED ORDER — ALBUMIN HUMAN 5 % IV SOLN: INTRAVENOUS | Status: DC | PRN

## 2020-09-01 MED ORDER — ROCURONIUM BROMIDE 10 MG/ML (PF) SYRINGE
PREFILLED_SYRINGE | INTRAVENOUS | Status: AC
  Filled 2020-09-01: qty 20

## 2020-09-01 MED ORDER — PROPOFOL 10 MG/ML IV BOLUS
INTRAVENOUS | Status: DC | PRN
  Administered 2020-09-01: 100 mg via INTRAVENOUS

## 2020-09-01 MED ORDER — MIDAZOLAM HCL 2 MG/2ML IJ SOLN
INTRAMUSCULAR | Status: DC | PRN
  Administered 2020-09-01 (×2): 1 mg via INTRAVENOUS

## 2020-09-01 MED ORDER — FENTANYL CITRATE (PF) 250 MCG/5ML IJ SOLN
INTRAMUSCULAR | Status: AC
  Filled 2020-09-01: qty 5

## 2020-09-01 MED ORDER — ALBUTEROL SULFATE (2.5 MG/3ML) 0.083% IN NEBU
2.5000 mg | INHALATION_SOLUTION | Freq: Four times a day (QID) | RESPIRATORY_TRACT | Status: AC
  Administered 2020-09-01 – 2020-09-03 (×8): 2.5 mg via RESPIRATORY_TRACT
  Filled 2020-09-01 (×7): qty 3

## 2020-09-01 MED ORDER — BUPIVACAINE HCL (PF) 0.25 % IJ SOLN
INTRAMUSCULAR | Status: AC
  Filled 2020-09-01: qty 30

## 2020-09-01 MED ORDER — BUPIVACAINE LIPOSOME 1.3 % IJ SUSP
INTRAMUSCULAR | Status: AC
  Filled 2020-09-01: qty 20

## 2020-09-01 MED ORDER — CHLORHEXIDINE GLUCONATE 0.12 % MT SOLN: 15.0000 mL | Freq: Once | OROMUCOSAL | Status: AC

## 2020-09-01 MED ORDER — PHENYLEPHRINE HCL-NACL 10-0.9 MG/250ML-% IV SOLN
INTRAVENOUS | Status: DC | PRN
  Administered 2020-09-01: 40 ug/min via INTRAVENOUS

## 2020-09-01 MED ORDER — PROPOFOL 10 MG/ML IV BOLUS
INTRAVENOUS | Status: AC
  Filled 2020-09-01: qty 40

## 2020-09-01 MED ORDER — SUCCINYLCHOLINE CHLORIDE 200 MG/10ML IV SOSY
PREFILLED_SYRINGE | INTRAVENOUS | Status: AC
  Filled 2020-09-01: qty 10

## 2020-09-01 MED ORDER — EPHEDRINE SULFATE-NACL 50-0.9 MG/10ML-% IV SOSY
PREFILLED_SYRINGE | INTRAVENOUS | Status: DC | PRN
  Administered 2020-09-01 (×2): 5 mg via INTRAVENOUS

## 2020-09-01 SURGICAL SUPPLY — 142 items
ADH SKN CLS APL DERMABOND .7 (GAUZE/BANDAGES/DRESSINGS) ×6
APL PRP STRL LF DISP 70% ISPRP (MISCELLANEOUS) ×6
BLADE CLIPPER SURG (BLADE) ×4 IMPLANT
BLADE SURG 11 STRL SS (BLADE) ×4 IMPLANT
BUTTON OLYMPUS DEFENDO 5 PIECE (MISCELLANEOUS) ×4 IMPLANT
CANISTER SUCT 3000ML PPV (MISCELLANEOUS) ×8 IMPLANT
CANNULA REDUC XI 12-8 STAPL (CANNULA) ×4
CANNULA REDUCER 12-8 DVNC XI (CANNULA) ×3 IMPLANT
CATH THORACIC 28FR (CATHETERS) ×1 IMPLANT
CHLORAPREP W/TINT 26 (MISCELLANEOUS) ×8 IMPLANT
CNTNR URN SCR LID CUP LEK RST (MISCELLANEOUS) ×3 IMPLANT
CONN ST 1/4X3/8  BEN (MISCELLANEOUS) ×4
CONN ST 1/4X3/8 BEN (MISCELLANEOUS) ×3 IMPLANT
CONT SPEC 4OZ STRL OR WHT (MISCELLANEOUS) ×20
COVER BACK TABLE 60X90IN (DRAPES) ×4 IMPLANT
COVER TIP SHEARS 8 DVNC (MISCELLANEOUS) ×3 IMPLANT
COVER TIP SHEARS 8MM DA VINCI (MISCELLANEOUS) ×4
DEFOGGER SCOPE WARMER CLEARIFY (MISCELLANEOUS) ×4 IMPLANT
DERMABOND ADVANCED (GAUZE/BANDAGES/DRESSINGS) ×2
DERMABOND ADVANCED .7 DNX12 (GAUZE/BANDAGES/DRESSINGS) ×6 IMPLANT
DEVICE SUTURE ENDOST 10MM (ENDOMECHANICALS) IMPLANT
DRAIN CHANNEL 19F RND (DRAIN) ×1 IMPLANT
DRAIN CHANNEL 28F RND 3/8 FF (WOUND CARE) IMPLANT
DRAIN PENROSE 1/2X12 LTX STRL (WOUND CARE) ×1 IMPLANT
DRAIN PENROSE 1/4X12 LTX STRL (WOUND CARE) ×4 IMPLANT
DRAPE ARM DVNC X/XI (DISPOSABLE) ×12 IMPLANT
DRAPE COLUMN DVNC XI (DISPOSABLE) ×3 IMPLANT
DRAPE CV SPLIT W-CLR ANES SCRN (DRAPES) ×7 IMPLANT
DRAPE DA VINCI XI ARM (DISPOSABLE) ×16
DRAPE DA VINCI XI COLUMN (DISPOSABLE) ×4
DRAPE HALF SHEET 40X57 (DRAPES) ×8 IMPLANT
DRAPE INCISE IOBAN 66X45 STRL (DRAPES) ×8 IMPLANT
DRAPE ORTHO SPLIT 77X108 STRL (DRAPES) ×8
DRAPE SURG ORHT 6 SPLT 77X108 (DRAPES) ×6 IMPLANT
ELECT REM PT RETURN 9FT ADLT (ELECTROSURGICAL) ×4
ELECTRODE REM PT RTRN 9FT ADLT (ELECTROSURGICAL) ×3 IMPLANT
EVACUATOR SILICONE 100CC (DRAIN) ×1 IMPLANT
FELT TEFLON 1X6 (MISCELLANEOUS) ×1 IMPLANT
FORCEPS GRASP COMBO 8X230 (FORCEP) ×4 IMPLANT
GAUZE KITTNER 4X5 RF (MISCELLANEOUS) ×4 IMPLANT
GAUZE SPONGE 4X4 12PLY STRL (GAUZE/BANDAGES/DRESSINGS) ×4 IMPLANT
GLOVE SURG ENC MOIS LTX SZ7 (GLOVE) ×4 IMPLANT
GLOVE SURG ENC MOIS LTX SZ7.5 (GLOVE) ×4 IMPLANT
GOWN STRL REUS W/ TWL LRG LVL3 (GOWN DISPOSABLE) ×6 IMPLANT
GOWN STRL REUS W/ TWL XL LVL3 (GOWN DISPOSABLE) ×9 IMPLANT
GOWN STRL REUS W/TWL LRG LVL3 (GOWN DISPOSABLE) ×8
GOWN STRL REUS W/TWL XL LVL3 (GOWN DISPOSABLE) ×12
GRASPER ENDOPATH ANVIL 10MM (MISCELLANEOUS) ×4 IMPLANT
GRASPER SUT TROCAR 14GX15 (MISCELLANEOUS) ×4 IMPLANT
HEMOSTAT SURGICEL 2X14 (HEMOSTASIS) ×3 IMPLANT
IRRIGATOR SUCT 8 DISP DVNC XI (IRRIGATION / IRRIGATOR) IMPLANT
IRRIGATOR SUCTION 8MM XI DISP (IRRIGATION / IRRIGATOR) ×4
IV NS 1000ML (IV SOLUTION)
IV NS 1000ML BAXH (IV SOLUTION) IMPLANT
KIT BASIN OR (CUSTOM PROCEDURE TRAY) ×4 IMPLANT
KIT DILATOR VASC 18G NDL (KITS) IMPLANT
KIT TURNOVER KIT B (KITS) ×4 IMPLANT
MARKER SKIN DUAL TIP RULER LAB (MISCELLANEOUS) ×4 IMPLANT
NDL 18GX1X1/2 (RX/OR ONLY) (NEEDLE) IMPLANT
NDL SCLEROTHERAPY 23X2X240 (NEEDLE) IMPLANT
NEEDLE 18GX1X1/2 (RX/OR ONLY) (NEEDLE) IMPLANT
NEEDLE SCLEROTHERAPY 23X2X240 (NEEDLE) IMPLANT
NS IRRIG 1000ML POUR BTL (IV SOLUTION) ×8 IMPLANT
OBTURATOR OPTICAL STANDARD 8MM (TROCAR) ×4
OBTURATOR OPTICAL STND 8 DVNC (TROCAR) ×3
OBTURATOR OPTICALSTD 8 DVNC (TROCAR) IMPLANT
OIL SILICONE PENTAX (PARTS (SERVICE/REPAIRS)) IMPLANT
PACK CHEST (CUSTOM PROCEDURE TRAY) ×4 IMPLANT
PACK UNIVERSAL I (CUSTOM PROCEDURE TRAY) ×4 IMPLANT
PAD ARMBOARD 7.5X6 YLW CONV (MISCELLANEOUS) ×8 IMPLANT
PORT ACCESS TROCAR AIRSEAL 12 (TROCAR) ×3 IMPLANT
PORT ACCESS TROCAR AIRSEAL 5M (TROCAR) ×1
RELOAD ENDO STITCH (ENDOMECHANICALS) ×4 IMPLANT
RELOAD STAPLE 45 2.5 WHT DVNC (STAPLE) IMPLANT
RELOAD STAPLE 45 3.5 BLU DVNC (STAPLE) IMPLANT
RELOAD STAPLE 45 4.3 GRN DVNC (STAPLE) IMPLANT
RELOAD STAPLER 2.5X45 WHT DVNC (STAPLE) ×6 IMPLANT
RELOAD STAPLER 3.5X45 BLU DVNC (STAPLE) ×36 IMPLANT
RELOAD STAPLER 4.3X45 GRN DVNC (STAPLE) ×3 IMPLANT
RELOAD SUT TRIPLE-STITCH 2-0 (ENDOMECHANICALS) IMPLANT
RETRACTOR WOUND ALXS 19CM XSML (INSTRUMENTS) ×3 IMPLANT
RTRCTR WOUND ALEXIS 19CM XSML (INSTRUMENTS)
SCISSORS LAP 5X35 DISP (ENDOMECHANICALS) ×4 IMPLANT
SEAL CANN UNIV 5-8 DVNC XI (MISCELLANEOUS) ×9 IMPLANT
SEAL XI 5MM-8MM UNIVERSAL (MISCELLANEOUS) ×16
SEALER SYNCHRO 8 IS4000 DV (MISCELLANEOUS) ×4
SEALER SYNCHRO 8 IS4000 DVNC (MISCELLANEOUS) IMPLANT
SET IRRIG TUBING LAPAROSCOPIC (IRRIGATION / IRRIGATOR) IMPLANT
SET TRI-LUMEN FLTR TB AIRSEAL (TUBING) ×4 IMPLANT
SHEET MEDIUM DRAPE 40X70 STRL (DRAPES) ×4 IMPLANT
STAPLER 45 DA VINCI SURE FORM (STAPLE) ×8
STAPLER 45 SUREFORM DVNC (STAPLE) IMPLANT
STAPLER CANNULA SEAL DVNC XI (STAPLE) ×3 IMPLANT
STAPLER CANNULA SEAL XI (STAPLE) ×4
STAPLER CIRC 25MM 4.8MM THK (STAPLE) IMPLANT
STAPLER RELOAD 2.5X45 WHITE (STAPLE) ×8
STAPLER RELOAD 2.5X45 WHT DVNC (STAPLE) ×6
STAPLER RELOAD 3.5X45 BLU DVNC (STAPLE) ×36
STAPLER RELOAD 3.5X45 BLUE (STAPLE) ×48
STAPLER RELOAD 4.3X45 GREEN (STAPLE) ×4
STAPLER RELOAD 4.3X45 GRN DVNC (STAPLE) ×3
STAPLER TRANS-ORAL 25MM EEA (STAPLE) IMPLANT
STOPCOCK 4 WAY LG BORE MALE ST (IV SETS) ×4 IMPLANT
SUT ETHIBOND 0 36 GRN (SUTURE) ×3 IMPLANT
SUT SILK  1 MH (SUTURE) ×8
SUT SILK 1 MH (SUTURE) ×3 IMPLANT
SUT SILK 2 0 SH (SUTURE) ×3 IMPLANT
SUT V-LOC BARB 180 2/0GR6 GS22 (SUTURE) ×20
SUT VIC AB 2-0 CT1 27 (SUTURE) ×4
SUT VIC AB 2-0 CT1 36 (SUTURE) IMPLANT
SUT VIC AB 2-0 CT1 TAPERPNT 27 (SUTURE) IMPLANT
SUT VIC AB 2-0 SH 18 (SUTURE) ×1 IMPLANT
SUT VIC AB 2-0 SH 27 (SUTURE)
SUT VIC AB 2-0 SH 27XBRD (SUTURE) IMPLANT
SUT VIC AB 3-0 SH 27 (SUTURE) ×16
SUT VIC AB 3-0 SH 27X BRD (SUTURE) ×18 IMPLANT
SUT VIC AB 3-0 SH 8-18 (SUTURE) ×4 IMPLANT
SUT VICRYL 0 TIES 12 18 (SUTURE) ×1 IMPLANT
SUT VICRYL 0 UR6 27IN ABS (SUTURE) ×8 IMPLANT
SUT VLOC 180 0 9IN  GS21 (SUTURE)
SUT VLOC 180 0 9IN GS21 (SUTURE) ×3 IMPLANT
SUT VLOC 180 2-0 9IN GS21 (SUTURE) ×1 IMPLANT
SUTURE V-LC BRB 180 2/0GR6GS22 (SUTURE) IMPLANT
SYR 10ML LL (SYRINGE) ×4 IMPLANT
SYR 20ML ECCENTRIC (SYRINGE) ×4 IMPLANT
SYR 20ML LL LF (SYRINGE) ×4 IMPLANT
SYR 30ML SLIP (SYRINGE) ×4 IMPLANT
SYR 50ML LL SCALE MARK (SYRINGE) ×4 IMPLANT
SYSTEM SAHARA CHEST DRAIN ATS (WOUND CARE) ×4 IMPLANT
TAPE PAPER 2X10 WHT MICROPORE (GAUZE/BANDAGES/DRESSINGS) ×1 IMPLANT
TOWEL GREEN STERILE (TOWEL DISPOSABLE) ×4 IMPLANT
TOWEL GREEN STERILE FF (TOWEL DISPOSABLE) ×4 IMPLANT
TRAY FOLEY MTR SLVR 16FR STAT (SET/KITS/TRAYS/PACK) ×4 IMPLANT
TROCAR XCEL BLADELESS 5X75MML (TROCAR) ×4 IMPLANT
TROCAR XCEL NON-BLD 5MMX100MML (ENDOMECHANICALS) IMPLANT
TUBE CONNECTING 20X1/4 (TUBING) ×4 IMPLANT
TUBE J 18FR (TUBING) IMPLANT
TUBING ENDO SMARTCAP (MISCELLANEOUS) ×4 IMPLANT
TUBING EXTENTION W/L.L. (IV SETS) ×4 IMPLANT
TUBING LAP HI FLOW INSUFFLATIO (TUBING) ×4 IMPLANT
UNDERPAD 30X36 HEAVY ABSORB (UNDERPADS AND DIAPERS) ×4 IMPLANT
WATER STERILE IRR 1000ML POUR (IV SOLUTION) ×8 IMPLANT

## 2020-09-01 NOTE — Hospital Course (Addendum)
HPI: This is a 55 year old gentleman with a history of a benign esophageal stricture who was originally evaluated as an inpatient for placement of jejunostomy feeding tube.  He states that he has gained weight over the last few weeks.  He remains unable to tolerate solid food, and has difficulty with liquids as well.  He does continue to smoke and is been unable to stop.  He would like to proceed with surgery.  His stress test was negative and his pulmonary function tests were adequate. Dr. Cliffton Asters discussed possible benefits, complications, and risks of a esophagogastroduodenoscopy and robotic assisted Ivor Lewis esophagectomy. Patient agreed to proceed with surgery.  Hospital Course: Patient was transferred from the OR to PACU in stable condition.  His CXR initially showed pneumoperitoneum, pneumothorax on the right and right side sub cutaneous emphysema.  Follow up xrays were closely monitored,  with resolution of pneumothorax and pneumoperitoneum.  His right sub cutaneous emphysema persisted, but remained stable  The patient had a lot of pain post operatively.  His medication regimen was adjusted without relief.  Due to this PCA use was instituted with improvement of pain control.  Tube feeds were initiated on POD #1 and titrated on goal by POD #2.  Unfortunately the patient developed bilious output around jejunostomy site.  His tube feeds were held.  IR drain study was obtained and showed no abnormality with tube placement.  His tube feedings were resumed on 7/31 at reduced rate of 30 ml/hr.  His NG tube output increased to > 600 cc after drain study.  There was some concern that too much fluid was in the jejunostomy tube balloon.  Some saline was removed.  He underwent barium esophogram on 09/05/2020.  This showed Swallows around the NG tube within performed with no visible contrast extravasation though with limited assessment.  He continued to have bilious drainage around the tube and we continued to do  wound care and dressing changes. We discontinued his PCA to encourage ambulation around the unit. He ended up having some bilious drainage from his chest tube and JP drain which indicated a possible anastomosis leak. Therefore, we planned to take him back to the OR on 8/3 for a endoscopy and re-exploration. Before the exchange we ordered a CT of the chest, abdomen, and pelvis to look at bowel anatomy and identify a leak. He was taken back to the OR on 8/3 for reinforcement of an esophageal anastomosis leak. We also had IR exchange his J-tube since it was not functioning. They were able to do the exchange on 8/4. Critical care was consulted since he was extubated but then had to be re-intubated for work of breathing, poor cough mechanics, and delirium on 8/5. He remained in the ICU over the weekend. We continued him on vanc, zosyn, and diflucan. He remained on TPN. He remained sedated and intubated at this time. Tracheostomy done 8/9.  We were weaning Levophed as tolerated. J tube is now functioning and we are slowly ramping up his tube feeds. He remains on TPN for complete nutrition. We are removing the peritoneal drain today. His antibiotics were changed given new cultures. He remains on midodrine for his medication inducted hypotension. He had a fever of 101.4 over the weekend and [place] were initiated. He was found to have intermittent bradycardia and transient heart block and cardiology was consulted.  He pulled the 44f tube and continued the 37f blake drain. He continued to do well on trach collar, he was tolerating  full dose tube feeds, and we consulted cardiology to see him due to his bradycardia with pauses. He was progressed to thin liquids on 8/26 and transfer orders were placed for 2C. We began to work on placement for SNF/LTAC or possibly CIR.  He was transferred to [facility] and is showing steady overall progress although limited due to significant deconditioning with cachexia/malnutrition.  He has  had loose stools associated with his tube feeds and sorbitol has been stopped.  He is now on as needed Imodium.  We are hoping to downsize trach with possible decannulation soon. We stopped his dilaudid to help prepare for possible discharge to CIR. We continued to encourage him to participate with PT so that he does not lose his strength and endurance.  PT evaluation was completed and they recommended CIR placement.  Unfortunately this denied by insurance.  The patient was felt to be a candidate for LTAC but he ultimately refused bed offer due to his father dying at that facility.  The patient overall remains clinically stable.  Pulmonary feels patient can attempt capping trials once his strength has improved.  He will continue tube feedings and a liquid diet until his trach has been removed.  He needs aggressive rehabilitation.  His incisions are well healed.  He continues to struggle with diarrhea and is on imodium.  We continued to work on his difficult disposition. His trach was decannulated on 9/8 and after SLP's evaluation his diet was advanced to Dysphagia 2 diet and was tolerating however oral intake remained low.  He overall felt full during the day.  Due to this his tube feedings were transitioned to nightly and a calorie count was initiated to encourage oral intake. Heis now advanced and  tolerating a dysphagia 3 diet during the day but still struggling with oral intake.  It is showing steady improvement.  Social work continued to work on obtaining a bed in a skilled nursing facility or rehab as he was not felt to be medically safe for discharge to home.

## 2020-09-01 NOTE — Brief Op Note (Addendum)
09/01/2020  3:52 PM  PATIENT:  Ralph [name]  55 y.o. male  PRE-OPERATIVE DIAGNOSIS:  Esophageal Stricture  POST-OPERATIVE DIAGNOSIS:  Esophageal Stricture  PROCEDURE:  ESOPHAGOGASTRODUODENOSCOPY (EGD), XI ROBOTIC ASSISTED IVOR LEWIS ESOPHAGECTOMY, and INTERCOSTAL NERVE BLOCK  SURGEON:  Surgeon(s) and Role:    Lightfoot, Eliezer Lofts, MD - Primary  PHYSICIAN ASSISTANT: Doree Fudge PA-C  ANESTHESIA:   general  EBL:  10 mL   BLOOD ADMINISTERED:none  DRAINS:  Chest tube placed in the right pleural space    LOCAL MEDICATIONS USED:  OTHER Exparel and Marcaine  SPECIMEN:  Source of Specimen:  Esophagus  DISPOSITION OF SPECIMEN:  PATHOLOGY  COUNTS CORRECT:  YES  DICTATION: .Dragon Dictation  PLAN OF CARE: Admit to inpatient   PATIENT DISPOSITION:  PACU - hemodynamically stable.   Delay start of Pharmacological VTE agent (>24hrs) due to surgical blood loss or risk of bleeding: yes

## 2020-09-01 NOTE — Anesthesia Postprocedure Evaluation (Signed)
Anesthesia Post Note  Patient: Ralph [name]  Procedure(s) Performed: XI ROBOTIC ASSISTED IVOR LEWIS ESOPHAGECTOMY (Chest) ESOPHAGOGASTRODUODENOSCOPY (EGD) INTERCOSTAL NERVE BLOCK (Right: Chest)     Patient location during evaluation: PACU Anesthesia Type: General Level of consciousness: awake and alert, oriented and patient cooperative Pain management: pain level controlled Vital Signs Assessment: post-procedure vital signs reviewed and stable Respiratory status: spontaneous breathing, nonlabored ventilation and respiratory function stable Cardiovascular status: blood pressure returned to baseline and stable Postop Assessment: no apparent nausea or vomiting Anesthetic complications: no   No notable events documented.  Last Vitals:  Vitals:   09/01/20 1725 09/01/20 1740  BP: 103/72 99/69  Pulse: (!) 56 (!) 54  Resp: 16 13  Temp: (!) 36.1 C   SpO2: 99% 95%    Last Pain:  Vitals:   09/01/20 1725  TempSrc:   PainSc: Asleep                 Lannie Fields

## 2020-09-01 NOTE — Anesthesia Postprocedure Evaluation (Signed)
Anesthesia Post Note  Patient: Ralph [name]  Procedure(s) Performed: XI ROBOTIC ASSISTED IVOR LEWIS ESOPHAGECTOMY (Chest) ESOPHAGOGASTRODUODENOSCOPY (EGD) INTERCOSTAL NERVE BLOCK (Right: Chest)     Patient location during evaluation: PACU Anesthesia Type: General Level of consciousness: awake and alert, oriented and patient cooperative Pain management: pain level controlled Vital Signs Assessment: post-procedure vital signs reviewed and stable Respiratory status: spontaneous breathing, nonlabored ventilation and respiratory function stable Cardiovascular status: blood pressure returned to baseline and stable Postop Assessment: no apparent nausea or vomiting Anesthetic complications: no   No notable events documented.  Last Vitals:  Vitals:   09/01/20 1725 09/01/20 1740  BP: 103/72 99/69  Pulse: (!) 56 (!) 54  Resp: 16 13  Temp: (!) 36.1 C   SpO2: 99% 95%    Last Pain:  Vitals:   09/01/20 1725  TempSrc:   PainSc: Asleep                 Arrin Pintor M Marlette Curvin     

## 2020-09-01 NOTE — Anesthesia Procedure Notes (Signed)
Procedure Name: Intubation Date/Time: 09/01/2020 7:52 AM Performed by: Carolan Clines, CRNA Pre-anesthesia Checklist: Patient identified, Emergency Drugs available, Suction available and Patient being monitored Patient Re-evaluated:Patient Re-evaluated prior to induction Oxygen Delivery Method: Circle System Utilized Preoxygenation: Pre-oxygenation with 100% oxygen Induction Type: Rapid sequence Ventilation: Mask ventilation without difficulty Laryngoscope Size: Mac and 4 Grade View: Grade I Tube type: Oral Endobronchial tube: 37 Fr Number of attempts: 1 Airway Equipment and Method: Stylet and Oral airway Placement Confirmation: ETT inserted through vocal cords under direct vision, positive ETCO2 and breath sounds checked- equal and bilateral Secured at: 29 cm Tube secured with: Tape Dental Injury: Teeth and Oropharynx as per pre-operative assessment  Comments: Inserted by Paulina Fusi, SRNA

## 2020-09-01 NOTE — Transfer of Care (Signed)
Immediate Anesthesia Transfer of Care Note  Patient: TEANDRE HAMRE  Procedure(s) Performed: XI ROBOTIC ASSISTED IVOR LEWIS ESOPHAGECTOMY (Chest) ESOPHAGOGASTRODUODENOSCOPY (EGD) INTERCOSTAL NERVE BLOCK (Right: Chest)  Patient Location: PACU  Anesthesia Type:General  Level of Consciousness: drowsy  Airway & Oxygen Therapy: Patient Spontanous Breathing and Patient connected to face mask oxygen  Post-op Assessment: Report given to RN and Post -op Vital signs reviewed and stable  Post vital signs: Reviewed and stable  Last Vitals:  Vitals Value Taken Time  BP 103/72 09/01/20 1724  Temp    Pulse 55 09/01/20 1730  Resp 14 09/01/20 1730  SpO2 100 % 09/01/20 1730  Vitals shown include unvalidated device data.  Last Pain:  Vitals:   09/01/20 0635  TempSrc:   PainSc: 5       Patients Stated Pain Goal: 3 (09/01/20 6606)  Complications: No notable events documented.

## 2020-09-01 NOTE — Progress Notes (Signed)
   09/01/20 2200  Assess: MEWS Score  BP 97/72  Pulse Rate (!) 48  ECG Heart Rate (!) 47  Resp 14  SpO2 95 %  O2 Device Room Air  Assess: MEWS Score  MEWS Temp 0  MEWS Systolic 1  MEWS Pulse 1  MEWS RR 0  MEWS LOC 0  MEWS Score 2  MEWS Score Color Yellow  Assess: if the MEWS score is Yellow or Red  Were vital signs taken at a resting state? Yes  Focused Assessment No change from prior assessment  Early Detection of Sepsis Score *See Row Information* Low  MEWS guidelines implemented *See Row Information* Yes  Treat  MEWS Interventions Escalated (See documentation below)  Take Vital Signs  Increase Vital Sign Frequency  Yellow: Q 2hr X 2 then Q 4hr X 2, if remains yellow, continue Q 4hrs  Escalate  MEWS: Escalate Yellow: discuss with charge nurse/RN and consider discussing with provider and RRT  Notify: Charge Nurse/RN  Name of Charge Nurse/RN Notified Idelia Salm, RN  Date Charge Nurse/RN Notified 09/01/20  Time Charge Nurse/RN Notified 2200  Document  Patient Outcome Other (Comment) (Stable - no interventions at this time)  Progress note created (see row info) Yes

## 2020-09-01 NOTE — Anesthesia Procedure Notes (Signed)
Central Venous Catheter Insertion Performed by: Shelton Silvas, MD, anesthesiologist Start/End7/28/2022 6:55 AM, 09/01/2020 7:05 AM Patient location: Pre-op. Preanesthetic checklist: patient identified, IV checked, site marked, risks and benefits discussed, surgical consent, monitors and equipment checked, pre-op evaluation, timeout performed and anesthesia consent Position: Trendelenburg Lidocaine 1% used for infiltration and patient sedated Hand hygiene performed , maximum sterile barriers used  and Seldinger technique used Catheter size: 8 Fr Total catheter length 16. Central line was placed.Double lumen Procedure performed using ultrasound guided technique. Ultrasound Notes:anatomy identified, needle tip was noted to be adjacent to the nerve/plexus identified, no ultrasound evidence of intravascular and/or intraneural injection and image(s) printed for medical record Attempts: 1 Following insertion, dressing applied, line sutured and Biopatch. Post procedure assessment: blood return through all ports  Patient tolerated the procedure well with no immediate complications.

## 2020-09-01 NOTE — Op Note (Signed)
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  He was intolerant to solid foods and barely able to tolerate liquids.  At his previous admission we were unable to pass through the orifice of the stricture thus a laparoscopic feeding tube was placed in preparation for an esophagectomy.  He had had multiple biopsies before and all were negative for malignancy.  In regards to his previous strictures he had undergone dilation and multiple other times prior to developing this tight stricture.  Findings: During the laparoscopy, his anatomy was normal.  It was difficult to mobilize the esophagus into the hiatus due to the significant dense adhesions.  There is possible that he had a perforation during previous dilations.  I will perform the pyloroplasty.  The conduit was created and it was viable with good blood flow.  During the thoracoscopy there were dense adhesions throughout the thoracic esophagus.  The azygos vein was divided and we were able to dissect around the esophagus at this point, and carried it down after it was encircled with a Penrose drain.  The  adhesions were also very dense.  The esophagus was also densely adherent to the left pleural space which had to be entered for complete dissection.  We were able to mobilize the esophagus and then delivered the gastric conduit into the chest.  It appeared well vascularized.  I we performed robotically handsewn anastomosis with interrupted 2-0 Vicryl suture, and 2 O V-lock suture.  We completed our endoscopy.  The anastomosis looked intact.  The gastric conduit appeared viable.  The pylorus was widely open.  Operative Technique: After the risks, benefits and alternatives were thoroughly discussed, the patient was brought to the operative theatre.  Anesthesia was induced, and the patient was then prepped and draped in normal sterile fashion.  An appropriate surgical pause was performed, and pre-operative antibiotics were dosed accordingly.  We began with a 1 cm incision 15 cm caudad from the xiphoid and slightly lateral to the umbilicus.  Using an Optiview we entered the peritoneal space.  The abdomen was then insufflated with CO2.  3 other robotic ports were placed to triangulate the hiatus.  Another 12 mm port was placed in place at the level of the umbilicus laterally for an assistant port and another 5 mm trocar was placed in the right lower quadrant for liver retractor.  The patient was then placed in steep reverse Trendelenburg and the liver was elevated to expose the esophageal hiatus.  And then the robot was docked.  We began by dividing the gastrohepatic ligament to expose the right diaphragmatic crus  and then dissected the esophagus into the mediastinum.  We then divided the short gastrics and moved towards the right crus and completed our dissection along the esophageal hiatus.  A Penrose drain was then used to encircle the the esophagus and we continued our dissection up into the mediastinum.  The stomach was then retracted superiorly, and we mobilized it off of the pancreas.  The left gastric  artery was then isolated and divided with a robotic stabler. The right gastroepiploic artery was easily visible, and began to divide the omentum.    Once we achieved good mobilization, we focused our attention on the pylorus.  A 3cm longitudinal incision in the stomach and duodenum was made, and and then we performed a pyloroplasty with 2 O V- lock suture.  The suture line was then imbricated with another layer of suture.  We then began to tubularized the gastric conduit with several fires of the robotic stapler.  Once complete, the conduit was then attached to the distal end of the specimen.  We then undocked the robot, after removing all instruments. The pneumoperitoneum was released, and all ports were removed.  The incisions were closed with absorbable suture.    The patient was then placed in a left lateral decubitus position.  The robotic ports were placed to triangulate the esophagus.  We continued our mobilization of the esophagus up to the azygous vein.  The azygous vein was divided with a robotic stapler.  The esophagus was then mobilized circumferentially, and then divided at the level of the azygous vein.  We then pulled the specimen along with the gastric conduit up from the abdominal cavity.  We ensured that the conduit was oriented properly and we had good length to the conduit.  We then created an esophagotomy on a gastrotomy and attach them with Ethibond suture.  We then directly anastomosed the esophageal mucosa to the gastric mucosa with good serosal bites using 2-0 Vicryl suture.  This was then oversewn with a V-Loc suture.  This completed the back row for anastomosis.  We then used another V-Loc suture to anastomose mucosal surfaces on the front row with the connection.  The front row was then imbricated with a flap of omentum around it.  The ports were removed, and a 110F chest tube and a 19 [place] Blake were placed.  An intercostal nerve block was performed.  The lung was expanded.  The  incisions were closed with absorbable suture.  The patient was then placed back in a supine position and we advanced the esophagoscope through the oropharynx down into the cervical esophagus.  Under direct visualization we interrogated the anastomosis and it appeared intact.  We then passed the NG tube down to the level of the pylorus.  The gastroscope was then removed.  The patient tolerated the procedure without any immediate complications, and was transferred to the PACU in stable condition.  Staysha Truby Keane Scrape

## 2020-09-01 NOTE — Interval H&P Note (Signed)
History and Physical Interval Note:  09/01/2020 7:40 AM  Ralph [name]  has presented today for surgery, with the diagnosis of Esophageal Stricture.  The various methods of treatment have been discussed with the patient and family. After consideration of risks, benefits and other options for treatment, the patient has consented to  Procedure(s): XI ROBOTIC ASSISTED IVOR LEWIS ESOPHAGECTOMY (N/A) ESOPHAGOGASTRODUODENOSCOPY (EGD) (N/A) as a surgical intervention.  The patient's history has been reviewed, patient examined, no change in status, stable for surgery.  I have reviewed the patient's chart and labs.  Questions were answered to the patient's satisfaction.     Maurisha Mongeau Keane Scrape

## 2020-09-01 NOTE — Discharge Instructions (Addendum)
Basis of Modern Surgical Practice (21st ed., pp. 2952-8413). Elsevier.">    Esophagectomy, Care After The following information offers guidance on how to care for yourself after your procedure. Your health care provider may also give you more specific instructions. If you have problems or questions, contact your health careprovider. What can I expect after the procedure? After the procedure, it is common to have: Pain when swallowing or pain near incision sites. Tiredness (fatigue). Loss of appetite. Nausea, vomiting, or acid reflux. Heartburn. Diarrhea. Gas and bloating. Follow these instructions at home: Medicines  Take over-the-counter and prescription medicines only as told by your health care provider. If you were prescribed an antibiotic medicine, take it as told by your health care provider. Do not stop using the antibiotic even if you start to feel better. Ask your health care provider if the medicine prescribed to you: Requires you to avoid driving or using machinery. Can cause constipation. You may need to take these actions to prevent or treat constipation: Drink enough fluid to keep your urine pale yellow. Take over-the-counter or prescription medicines. Eat foods that are high in fiber, such as beans, whole grains, and fresh fruits and vegetables. Limit foods that are high in fat and processed sugars, such as fried or sweet foods.   Feeding tube If you were sent home with a feeding tube, follow instructions from your nutrition care provider about using the tube to get enough nutrition and takingcare of the tube. Eating and drinking Follow instructions from your health care provider about eating or drinking restrictions. You may need to eat smaller meals more often. Work with your nutrition care provider to maintain good nutrition. Eat and drink sitting up, and stay sitting up for at least 30 minutes after eating or drinking. Incision care  Follow instructions from your  health care provider about how to take care of your incisions. Make sure you: Wash your hands with soap and water for at least 20 seconds before and after you change your bandage (dressing). If soap and water are not available, use hand sanitizer. Change your dressing as told by your health care provider. Leave stitches (sutures) and staples in place. These skin closures may need to stay in place for 2 weeks or longer. Do not take baths, swim, or use a hot tub until your health care provider approves. Ask your health care provider if you may take showers. You may only be allowed to take sponge baths. Check your incision areas every day for signs of infection. Check for: More redness, swelling, or pain. Fluid or blood. Warmth. Pus or a bad smell.   Activity  Do not lift anything that is heavier than 10 lb (4.5 kg), or the limit that you are told, until your health care provider says that it is safe. Rest as told by your health care provider. Avoid sitting for a long time without moving. Get up to take short walks every 1-2 hours. This is important to improve blood flow and breathing. Ask for help if you feel weak or unsteady. Return to your normal activities as told by your health care provider. Ask your health care provider what activities are safe for you.   General instructions  Do not use any products that contain nicotine or tobacco. These products include cigarettes, chewing tobacco, and vaping devices, such as e-cigarettes. These can delay incision healing after surgery. If you need help quitting, ask your health care provider. Wear compression stockings as told by your health care  provider. These stockings help to prevent blood clots and reduce swelling in your legs. If directed, put ice on the affected area. To do this: Put ice in a plastic bag. Place a towel between your skin and the bag. Leave the ice on for 20 minutes, 2-3 times a day. Remove the ice if your skin turns bright  red. This is very important. If you cannot feel pain, heat, or cold, you have a greater risk of damage to the area. Keep all follow-up visits. This is important.   Contact a health care provider if: You have problems or questions about your feeding tube. You have any of these signs of infection: More redness, swelling, or pain around any incision. Fluid or blood coming from any incision. Warmth coming from any incision. Pus or a bad smell coming from any incision. A fever or chills. You have pain that is not relieved with medicine, or you have cramping or pain in your abdomen after eating. You have trouble swallowing. You have heartburn that keeps happening or gets worse. You have a cough that does not go away or gets worse. You lose weight without trying. Get help right away if: You have severe pain. You are bleeding from an incision area. You vomit every time you eat or drink. You are vomiting blood. You cannot swallow. You have trouble breathing or have shortness of breath. You have chest pain. These symptoms may represent a serious problem that is an emergency. Do not wait to see if the symptoms will go away. Get medical help right away. Call your local emergency services (911 in the [place] Do not drive yourself to the hospital. Summary After the procedure, it is common to have some pain and tiredness (fatigue). If you have a feeding tube, work with your nutrition care provider to maintain good nutrition and take care of your feeding tube. Follow instructions from your health care provider about how to take care of your incision areas. Return to your normal activities as told by your health care provider. This information is not intended to replace advice given to you by your health care provider. Make sure you discuss any questions you have with your healthcare provider. Document Revised: 01/20/2020 Document Reviewed: 01/20/2020 Elsevier Patient Education  2022 [employer].

## 2020-09-01 NOTE — Anesthesia Procedure Notes (Signed)
Arterial Line Insertion Start/End7/28/2022 7:15 AM, 09/01/2020 7:21 AM Performed by: CRNA  Patient location: Pre-op. Preanesthetic checklist: patient identified, IV checked, site marked, risks and benefits discussed, surgical consent, monitors and equipment checked, pre-op evaluation, timeout performed and anesthesia consent Lidocaine 1% used for infiltration and patient sedated Left, radial was placed Catheter size: 20 G Hand hygiene performed  and maximum sterile barriers used   Attempts: 2 Procedure performed without using ultrasound guided technique. Following insertion, Biopatch and dressing applied. Post procedure assessment: normal  Post procedure complications: unsuccessful attempts. Patient tolerated the procedure well with no immediate complications. Additional procedure comments: Unsuccessful attempts by SRNA x2. Marland Kitchen

## 2020-09-02 ENCOUNTER — Encounter (HOSPITAL_COMMUNITY): Payer: Self-pay | Admitting: Thoracic Surgery (Cardiothoracic Vascular Surgery)

## 2020-09-02 ENCOUNTER — Inpatient Hospital Stay (HOSPITAL_COMMUNITY): Payer: Medicaid Other

## 2020-09-02 LAB — GLUCOSE, CAPILLARY
Glucose-Capillary: 116 mg/dL — ABNORMAL HIGH (ref 70–99)
Glucose-Capillary: 68 mg/dL — ABNORMAL LOW (ref 70–99)
Glucose-Capillary: 80 mg/dL (ref 70–99)
Glucose-Capillary: 81 mg/dL (ref 70–99)
Glucose-Capillary: 83 mg/dL (ref 70–99)
Glucose-Capillary: 86 mg/dL (ref 70–99)

## 2020-09-02 LAB — BASIC METABOLIC PANEL
Anion gap: 6 (ref 5–15)
BUN: 10 mg/dL (ref 6–20)
CO2: 27 mmol/L (ref 22–32)
Calcium: 8.8 mg/dL — ABNORMAL LOW (ref 8.9–10.3)
Chloride: 100 mmol/L (ref 98–111)
Creatinine, Ser: 0.76 mg/dL (ref 0.61–1.24)
GFR, Estimated: >60 mL/min (ref >60)
Glucose, Bld: 103 mg/dL — ABNORMAL HIGH (ref 70–99)
Potassium: 4 mmol/L (ref 3.5–5.1)
Sodium: 133 mmol/L — ABNORMAL LOW (ref 135–145)

## 2020-09-02 LAB — CBC
HCT: 32.4 % — ABNORMAL LOW (ref 39.0–52.0)
Hemoglobin: 10.7 g/dL — ABNORMAL LOW (ref 13.0–17.0)
MCH: 31.2 pg (ref 26.0–34.0)
MCHC: 33 g/dL (ref 30.0–36.0)
MCV: 94.5 fL (ref 80.0–100.0)
Platelets: 193 10*3/uL (ref 150–400)
RBC: 3.43 MIL/uL — ABNORMAL LOW (ref 4.22–5.81)
RDW: 13.8 % (ref 11.5–15.5)
WBC: 7.8 10*3/uL (ref 4.0–10.5)
nRBC: 0 % (ref 0.0–0.2)

## 2020-09-02 MED ORDER — MORPHINE SULFATE (PF) 4 MG/ML IV SOLN
INTRAVENOUS | Status: AC
  Filled 2020-09-02: qty 1

## 2020-09-02 MED ORDER — ENOXAPARIN SODIUM 30 MG/0.3ML IJ SOSY
30.0000 mg | PREFILLED_SYRINGE | Freq: Every day | INTRAMUSCULAR | Status: DC
  Administered 2020-09-02 – 2020-09-22 (×21): 30 mg via SUBCUTANEOUS
  Filled 2020-09-02 (×21): qty 0.3

## 2020-09-02 MED ORDER — DIPHENHYDRAMINE HCL 12.5 MG/5ML PO ELIX
25.0000 mg | ORAL_SOLUTION | Freq: Every evening | ORAL | Status: DC | PRN
  Administered 2020-09-02 – 2020-09-04 (×3): 25 mg
  Filled 2020-09-02 (×3): qty 10

## 2020-09-02 MED ORDER — MORPHINE SULFATE (PF) 2 MG/ML IV SOLN
1.0000 mg | INTRAVENOUS | Status: DC | PRN
  Administered 2020-09-02: 2 mg via INTRAVENOUS
  Administered 2020-09-02 – 2020-09-03 (×8): 4 mg via INTRAVENOUS
  Filled 2020-09-02 (×5): qty 2
  Filled 2020-09-02: qty 1
  Filled 2020-09-02 (×3): qty 2

## 2020-09-02 MED ORDER — JEVITY 1.2 CAL PO LIQD
1000.0000 mL | ORAL | Status: DC
  Administered 2020-09-02: 1000 mL
  Filled 2020-09-02 (×2): qty 1000

## 2020-09-02 MED ORDER — SODIUM CHLORIDE 0.9 % IV BOLUS
500.0000 mL | Freq: Once | INTRAVENOUS | Status: AC
  Administered 2020-09-02: 500 mL via INTRAVENOUS

## 2020-09-02 MED ORDER — GERHARDT'S BUTT CREAM
TOPICAL_CREAM | Freq: Four times a day (QID) | CUTANEOUS | Status: DC
  Administered 2020-09-02 – 2020-10-24 (×22): 1 via TOPICAL
  Filled 2020-09-02 (×7): qty 1

## 2020-09-02 MED ORDER — MORPHINE SULFATE (PF) 2 MG/ML IV SOLN: 1.0000 mg | INTRAVENOUS | Status: DC | PRN

## 2020-09-02 NOTE — Progress Notes (Signed)
      [address]       [name] [address]             [phone]      1 Day Post-Op Procedure(s) (LRB): XI ROBOTIC ASSISTED IVOR LEWIS ESOPHAGECTOMY (N/A) ESOPHAGOGASTRODUODENOSCOPY (EGD) (N/A) INTERCOSTAL NERVE BLOCK (Right)  Subjective:  Patient feels miserable due to all the tubes in place.  He has pain due to the tubes which is relieved with pain medication.  He denies N/V.  Objective: Vital signs in last 24 hours: Temp:  [97 F (36.1 C)-97.7 F (36.5 C)] 97.7 F (36.5 C) (07/29 0718) Pulse Rate:  [43-56] 43 (07/29 0718) Cardiac Rhythm: Sinus bradycardia (07/29 0702) Resp:  [11-21] 11 (07/29 0718) BP: (80-133)/(64-97) 80/65 (07/29 0718) SpO2:  [92 %-99 %] 96 % (07/29 0718) Arterial Line BP: (95-101)/(44-51) 99/46 (07/28 1825)  Intake/Output from previous day: 07/28 0701 - 07/29 0700 In: 4318 [I.V.:2700; NG/GT:8; IV Piggyback:1350] Out: 1040 [Urine:555; Emesis/NG output:50; Drains:40; Blood:25; Chest Tube:370]  General appearance: alert, cooperative, and no distress Heart: regular rate and rhythm Lungs: clear to auscultation bilaterally Abdomen: soft, tender to palpation Extremities: extremities normal, atraumatic, no cyanosis or edema Wound: clean and dry  Lab Results: Recent Labs    09/01/20 1800 09/02/20 0511  WBC 10.9* 7.8  HGB 11.6* 10.7*  HCT 35.1* 32.4*  PLT 204 193   BMET:  Recent Labs    09/01/20 1800 09/02/20 0511  NA 133* 133*  K 4.0 4.0  CL 102 100  CO2 24 27  GLUCOSE 131* 103*  BUN 11 10  CREATININE 0.78 0.76  CALCIUM 9.0 8.8*    PT/INR:  Recent Labs    08/30/20 1400  LABPROT 12.5  INR 0.9   ABG    Component Value Date/Time   PHART 7.435 09/01/2020 2021   HCO3 25.5 09/01/2020 2021   TCO2 28 09/01/2020 1528   O2SAT 93.4 09/01/2020 2021   CBG (last 3)  Recent Labs    09/01/20 2336 09/02/20 0319 09/02/20 0717  GLUCAP 117* 116* 83    Assessment/Plan: S/P Procedure(s) (LRB): XI ROBOTIC ASSISTED  IVOR LEWIS ESOPHAGECTOMY (N/A) ESOPHAGOGASTRODUODENOSCOPY (EGD) (N/A) INTERCOSTAL NERVE BLOCK (Right)  CV- Sinus Bradycardia, Hypotension, SBP in the 80s- will give a 500 cc NS bolus Pulm- CT output 370 cc output since surgery, post operative CXR with right apical pneumothorax, small left pleural effusion, extensive pneumoperitoneum, lateral sub q emphysema.. will get portable film this morning Renal- creatinine is at 0.76, K is at 4.0 GI- NPO, NG tube in place 50 cc output- tube feedings currently at 10 ml/hr will progress very slowly.. will need swallow study Monday to check for anastomotic leak CBGS controlled, continue SSIP Lovenox for DVT prophylaxis    LOS: 1 day   [name], PA-C 09/02/2020

## 2020-09-02 NOTE — Discharge Summary (Addendum)
Physician Discharge Summary       [address]       [name] [address]             [phone]    Patient ID: Ralph [name] MRN: [id number] DOB/AGE: January 20, 1966 55 y.o.  Admit date: 09/01/2020 Discharge date: 10/25/2020  Admission Diagnoses:   Esophageal stricture  Discharge Diagnoses:  S/p esophagogastroduodenoscopy, Xi robotic assisted Ivor Lewis Esophagectomy, and Intercostal nerve block  2. Expected post op blood loss anemia 3. History of CAD 4. History of MI (HCC) 5. History of stroke [facility])  Consults: None  Procedure (s):  Esophagogastroduodenoscopy, Xi robotic assisted Ivor Lewis Esophagectomy, and Intercostal nerve block by Dr. Cliffton Asters on 09/01/2020.   Brief Hospital Course:  HPI: This is a 55 year old gentleman with a history of a benign esophageal stricture who was originally evaluated as an inpatient for placement of jejunostomy feeding tube.  He states that he has gained weight over the last few weeks.  He remains unable to tolerate solid food, and has difficulty with liquids as well.  He does continue to smoke and is been unable to stop.  He would like to proceed with surgery.  His stress test was negative and his pulmonary function tests were adequate. Dr. Cliffton Asters discussed possible benefits, complications, and risks of a esophagogastroduodenoscopy and robotic assisted Ivor Lewis esophagectomy. Patient agreed to proceed with surgery.  Hospital Course: Patient was transferred from the OR to PACU in stable condition.  His CXR initially showed pneumoperitoneum, pneumothorax on the right and right side sub cutaneous emphysema.  Follow up xrays were closely monitored,  with resolution of pneumothorax and pneumoperitoneum.  His right sub cutaneous emphysema persisted, but remained stable  The patient had a lot of pain post operatively.  His medication regimen was adjusted without relief.  Due to this PCA use was instituted with improvement of pain  control.  Tube feeds were initiated on POD #1 and titrated on goal by POD #2.  Unfortunately the patient developed bilious output around jejunostomy site.  His tube feeds were held.  IR drain study was obtained and showed no abnormality with tube placement.  His tube feedings were resumed on 7/31 at reduced rate of 30 ml/hr.  His NG tube output increased to > 600 cc after drain study.  There was some concern that too much fluid was in the jejunostomy tube balloon.  Some saline was removed.  He underwent barium esophogram on 09/05/2020.  This showed Swallows around the NG tube within performed with no visible contrast extravasation though with limited assessment.  He continued to have bilious drainage around the tube and we continued to do wound care and dressing changes. We discontinued his PCA to encourage ambulation around the unit. He ended up having some bilious drainage from his chest tube and JP drain which indicated a possible anastomosis leak. Therefore, we planned to take him back to the OR on 8/3 for a endoscopy and re-exploration. Before the exchange we ordered a CT of the chest, abdomen, and pelvis to look at bowel anatomy and identify a leak. He was taken back to the OR on 8/3 for reinforcement of an esophageal anastomosis leak. We also had IR exchange his J-tube since it was not functioning. They were able to do the exchange on 8/4. Critical care was consulted since he was extubated but then had to be re-intubated for work of breathing, poor cough mechanics, and delirium on 8/5. He remained in the ICU  over the weekend. We continued him on vanc, zosyn, and diflucan. He remained on TPN. He remained sedated and intubated at this time. Tracheostomy done 8/9.  We were weaning Levophed as tolerated. J tube is now functioning and we are slowly ramping up his tube feeds. He remains on TPN for complete nutrition. We are removing the peritoneal drain today. His antibiotics were changed given new cultures. He  remains on midodrine for his medication inducted hypotension. He had a fever of 101.4 over the weekend and [place] and Tobra were initiated. He was found to have intermittent bradycardia and transient heart block and cardiology was consulted.  He pulled the 7578f tube and continued the 4570f blake drain. He continued to do well on trach collar, he was tolerating full dose tube feeds, and we consulted cardiology to see him due to his bradycardia with pauses. He was progressed to thin liquids on 8/26 and transfer orders were placed for 2C. We began to work on placement for SNF/LTAC or possibly CIR.  He was transferred to [facility] and is showing steady overall progress although limited due to significant deconditioning with cachexia/malnutrition.  He has had loose stools associated with his tube feeds and sorbitol has been stopped.  He is now on as needed Imodium.  We are hoping to downsize trach with possible decannulation soon. We stopped his dilaudid to help prepare for possible discharge to CIR.   We continued to encourage him to participate with PT so that he does not lose his strength and endurance.  PT evaluation was completed and they recommended CIR placement.  Unfortunately this denied by insurance.  The patient was felt to be a candidate for LTAC but he ultimately refused bed offer due to his father dying at that facility.  The patient overall remains clinically stable.  Pulmonary feels patient can attempt capping trials once his strength has improved.  He will continue tube feedings and a liquid diet until his trach has been removed.  He needs aggressive rehabilitation.  His incisions are well healed.  He continues to struggle with diarrhea and is on imodium.  We continued to work on his difficult disposition. His trach was decannulated on 9/8 and after SLP's evaluation his diet was advanced to Dysphagia 2 diet and was tolerating however oral intake remained low.  He overall felt full during the day.  Due to this  his tube feedings were transitioned to nightly and a calorie count was initiated to encourage oral intake. He was tolerating a dysphagia 3 diet during the day but still struggling with oral intake.  The patient continues to remain clinically stable for discharge.  SNF facility has been arranged and he is cleared for discharge today per Dr. Cliffton AstersLightfoot.    Latest Vital Signs: Blood pressure 120/89, pulse 65, temperature (!) 97.3 F (36.3 C), temperature source Oral, resp. rate 15, height 5\' 8"  (1.727 m), weight 52.6 kg, SpO2 97 %.  Physical Exam: General appearance: alert, cooperative, and no distress Heart: regular rate and rhythm, S1, S2 normal, no murmur, click, rub or gallop Lungs: clear to auscultation bilaterally Abdomen: soft, non-tender; bowel sounds normal; no masses,  no organomegaly Extremities: extremities normal, atraumatic, no cyanosis or edema Wound: clean and dry  Discharge Condition:Stable and discharged to home.  Recent laboratory studies:  Lab Results  Component Value Date   WBC 9.1 10/14/2020   HGB 10.7 (L) 10/14/2020   HCT 33.7 (L) 10/14/2020   MCV 92.1 10/14/2020   PLT 234 10/14/2020  Lab Results  Component Value Date   NA 135 10/14/2020   K 3.6 10/14/2020   CL 97 (L) 10/14/2020   CO2 27 10/14/2020   CREATININE 0.39 (L) 10/14/2020   GLUCOSE 134 (H) 10/14/2020      Diagnostic Studies: DG CHEST PORT 1 VIEW  Result Date: 09/28/2020 CLINICAL DATA:  Empyema EXAM: PORTABLE CHEST 1 VIEW COMPARISON:  Portable exam at 1035 hrs compared to 09/26/2020 FINDINGS: Tracheostomy tube and RIGHT thoracostomy tubes unchanged. Normal heart size, mediastinal contours, and pulmonary vascularity. Bibasilar infiltrates unchanged. Small BILATERAL pleural effusions. Upper lungs clear. No pneumothorax. IMPRESSION: Stable RIGHT thoracostomy tubes. Persistent bibasilar infiltrates and small pleural effusions. Electronically Signed   By: Ulyses Southward M.D.   On: 09/28/2020 13:37   DG  Chest Port 1 View  Result Date: 09/26/2020 CLINICAL DATA:  Empyema. EXAM: PORTABLE CHEST 1 VIEW COMPARISON:  09/23/2020 and chest CT 09/24/2020 FINDINGS: Left pigtail chest tube has been removed. There is 1 large bore right chest tube with the tip near the right lung apex. Right arm PICC line tip is near the superior cavoatrial junction. Patchy parenchymal densities in the mid and lower right chest are unchanged. Negative for right pneumothorax. Rounded density at the left lung base is likely related to pleural densities. Residual patchy densities at the left lung base probably related to combination of pleural and parenchymal densities. Focal lucency near the left costophrenic angle may represent a small pocket of pleural air. Heart size is within normal limits. Patient has a tracheostomy tube. IMPRESSION: 1. Residual pleural and parenchymal densities in the left lower chest. Left chest tube has been removed. Suspect a small amount of loculated left pleural air in left lower chest. 2. A right chest tube is still present. Negative for right pneumothorax. Persistent patchy densities in the right lower chest. Electronically Signed   By: Richarda Overlie M.D.   On: 09/26/2020 08:22   DG ESOPHAGUS W SINGLE CM (SOL OR THIN BA)  Result Date: 09/29/2020 CLINICAL DATA:  Esophageal stricture; history of esophagectomy and leak post revision EXAM: ESOPHOGRAM/BARIUM SWALLOW TECHNIQUE: Single contrast examination was performed using water soluble contrast followed by thin barium. FLUOROSCOPY TIME:  Fluoroscopy Time:  3 minutes, 24 seconds Radiation Exposure Index (if provided by the fluoroscopic device): 16.6 mGy COMPARISON:  None. FINDINGS: Initial swallows with water-soluble contrast demonstrated no evidence of extravasation to suggest leak. As patient could only tolerate smaller volumes, degree of maximum distension is limited. Retention is noted within the hypopharynx. However, there is no stasis to suggest stricture beyond  this point. IMPRESSION: No evidence of leak. There is some contrast retention within the hypopharynx. No evidence of stricture within the neo-esophagus. Electronically Signed   By: Guadlupe Spanish M.D.   On: 09/29/2020 16:22     Discharge Medications: Allergies as of 10/24/2020   No Known Allergies      Medication List     STOP taking these medications    diclofenac Sodium 1 % Gel Commonly known as: VOLTAREN   gabapentin 250 MG/5ML solution Commonly known as: NEURONTIN   mupirocin ointment 2 % Commonly known as: BACTROBAN   oxyCODONE 5 MG/5ML solution Commonly known as: ROXICODONE   polyvinyl alcohol 1.4 % ophthalmic solution Commonly known as: LIQUIFILM TEARS   sennosides 8.8 MG/5ML syrup Commonly known as: SENOKOT   sucralfate 1 GM/10ML suspension Commonly known as: CARAFATE       TAKE these medications    acetaminophen 160 MG/5ML solution Commonly known as: TYLENOL  Place 20.3 mLs (650 mg total) into feeding tube every 8 (eight) hours as needed for fever. What changed:  when to take this reasons to take this   escitalopram 10 MG tablet Commonly known as: LEXAPRO Place 1 tablet (10 mg total) into feeding tube at bedtime.   feeding supplement (PROSource TF) liquid Place 45 mLs into feeding tube 3 (three) times daily. What changed:  how much to take when to take this   lactose free nutrition Liqd Take 237 mLs by mouth 3 (three) times daily between meals. What changed: You were already taking a medication with the same name, and this prescription was added. Make sure you understand how and when to take each.   feeding supplement (OSMOLITE 1.5 CAL) Liqd Place 1,000 mLs into feeding tube daily. Night time tube feeds. What changed: You were already taking a medication with the same name, and this prescription was added. Make sure you understand how and when to take each.   HYDROcodone-acetaminophen 7.5-325 mg/15 ml solution Commonly known as: HYCET Place 15  mLs into feeding tube every 6 (six) hours as needed for moderate pain.   lidocaine 5 % Commonly known as: LIDODERM Place 1 patch onto the skin daily. Remove & Discard patch within 12 hours or as directed by MD   loperamide HCl 1 MG/7.5ML suspension Commonly known as: IMODIUM Take 15 mLs (2 mg total) by mouth as needed for diarrhea or loose stools.   Melatonin 10 MG Tabs Place 10 mg into feeding tube at bedtime.   midodrine 10 MG tablet Commonly known as: PROAMATINE 1 tablet (10 mg total) by Per J Tube route 3 (three) times daily.   ondansetron 4 MG tablet Commonly known as: ZOFRAN Place 1 tablet (4 mg total) into feeding tube every 6 (six) hours as needed for nausea or vomiting.   pantoprazole sodium 40 mg Commonly known as: PROTONIX Place 40 mg into feeding tube daily. What changed: when to take this       Follow Up Appointments:  Follow-up Information     Lightfoot, Eliezer Lofts, MD Follow up.   Specialty: Cardiothoracic Surgery Contact information: [address] [place] [address] [phone]         Jodelle Red, MD. Call today.   Specialty: Cardiology Contact information: [address] [place] [address] [place] [address] [phone]                1. Please obtain vital signs at least one time daily 2.Please weigh the patient daily. If he or she continues to gain weight or develops lower extremity edema, contact the office at [phone]. 3. Ambulate patient at least three times daily and please use sternal precautions.  4. Please perform a calorie count since patient is denying night time TF feeds. He was only taking in 30% of his daily needs the last two days in the hospital.    Bernadette Hoit ContePA-C  10/25/2020 at 3:50 pm  Signed:

## 2020-09-02 NOTE — Progress Notes (Signed)
Initial Nutrition Assessment  DOCUMENTATION CODES:   Underweight, Severe malnutrition in context of chronic illness  INTERVENTION:   Continue tube feeding via J-tube: - Start Jevity 1.2 @ 20 ml/hr and advance by 10 ml q 8 hours to goal rate of 70 ml/hr (1680 ml/day)  Tube feeding regimen provides 2016 kcal, 93 grams of protein, and 1356 ml of H2O.   NUTRITION DIAGNOSIS:   Severe Malnutrition related to chronic illness (recurrent esophageal stricture s/p J-tube) as evidenced by severe muscle depletion, severe fat depletion.  GOAL:   Patient will meet greater than or equal to 90% of their needs  MONITOR:   Diet advancement, Labs, Weight trends, TF tolerance, I & O's  REASON FOR ASSESSMENT:   Consult Enteral/tube feeding initiation and management  ASSESSMENT:   55 year old male who presented on 7/28 for esophagogastroduodenoscopy and robotic assisted esophagectomy. PMH of esophageal stricture s/p multiple dilations and J-tube placement in April 2022, stroke, CAD, MI, anxiety.  Consult received for tube feeding initiation and management. Pt with J-tube in place and tube feeds currently infusing at 10 ml/hr. Pt also with NG tube to suction with dark output.  Spoke with pt at bedside. Pt reports that he is experiencing pain at this time. When RD asks about pt's tube feeding regimen PTA, pt states that he transitioned to nocturnal tube feeds. He reports that he infused Jevity 1.2 @ 100 ml/hr for 12 hours overnight, starting at 5:00 pm or 6:00 pm each night. This regimen would provide 1440 kcal and 67 grams of protein, meeting 76% of estimated kcal needs and 79% of estimated protein needs.  Pt states that he was eating and drinking when he could. He reports eating soft food items like mashed potatoes or mac and cheese. Pt reports eating once a day and eating small portions. He reports drinking Sprite, Coke, and oral nutrition supplements like Ensure.  Pt endorses weight fluctuations  PTA. He states that his UBW is between 118-125 lbs. He notes that lately his weight has been at the low end of this range. Current weight of 115 lbs appears to be stated rather than measured. If accurate, pt has experienced a 4.5 kg weight loss in less than 1 month. This is a 7.9% weight loss which is severe and significant for timeframe. Pt meets criteria for severe malnutrition.  Medications reviewed and include: SSI q 4 hours, protonix  Labs reviewed: sodium 133 CBG's: 68-117 x 24 hours  UOP: 555 ml x 24 hours NGT: 50 ml x 24 hours RLQ JP drain: 40 ml x 24 hours CT: 370 ml x 24 hours I/O's: +3.2 L since admit  NUTRITION - FOCUSED PHYSICAL EXAM:  Flowsheet Row Most Recent Value  Orbital Region Severe depletion  Upper Arm Region Moderate depletion  Thoracic and Lumbar Region Severe depletion  Buccal Region Severe depletion  Temple Region Severe depletion  Clavicle Bone Region Severe depletion  Clavicle and Acromion Bone Region Severe depletion  Scapular Bone Region Moderate depletion  Dorsal Hand Moderate depletion  Patellar Region Severe depletion  Anterior Thigh Region Severe depletion  Posterior Calf Region Severe depletion  Edema (RD Assessment) None  Hair Reviewed  Eyes Reviewed  Mouth Reviewed  Skin Reviewed  Nails Reviewed       Diet Order:   Diet Order             Diet NPO time specified  Diet effective now  EDUCATION NEEDS:   No education needs have been identified at this time  Skin:  Skin Assessment: Skin Integrity Issues: Incisions: abdomen, R chest  Last BM:  no documented BM  Height:   Ht Readings from Last 1 Encounters:  09/01/20 _0  (1.727 m)    Weight:   Wt Readings from Last 1 Encounters:  09/01/20 52.2 kg    BMI:  Body mass index is 17.49 kg/m.  Estimated Nutritional Needs:   Kcal:  1900-2100  Protein:  85-100 grams  Fluid:  >/= 2.0 L    Gustavus Bryant, MS, RD, LDN Inpatient Clinical  Dietitian Please see AMiON for contact information.

## 2020-09-02 NOTE — Plan of Care (Signed)
  Problem: Education: Goal: Knowledge of the prescribed therapeutic regimen will improve Outcome: Progressing   

## 2020-09-02 NOTE — Progress Notes (Signed)
Tube feed tubing changed.

## 2020-09-02 NOTE — Consult Note (Addendum)
WOC Nurse Consult Note: Patient receiving care in [facility] 2C05 Reason for Consult: Irritation around G-Tube Wound type: MASD/ITD Pressure Injury POA: NA Measurement: Wound bed: Drainage (amount, consistency, odor) Green bile Periwound: Dressing procedure/placement/frequency: Clean around the the G-Tube with NS, pat dry. Apply a light coat of Gerhardt's cream (in orders already) then place a Drawtex gauze [place] # [id number]) around the tube for absorption. Apply twice daily or PRN soiling.  ICD-10 CM Codes for Irritant Dermatitis L24A0 - Due to friction or contact with body fluids; unspecified   Monitor the wound area(s) for worsening of condition such as: Signs/symptoms of infection, increase in size, development of or worsening of odor, development of pain, or increased pain at the affected locations.   Notify the medical team if any of these develop.  Thank you for the consult. WOC nurse will not follow at this time. Please re-consult the WOC team if needed.  Renaldo Reel Katrinka Blazing, MSN, RN, CMSRN, Angus Seller, [facility] Wound Treatment Associate Pager [phone]

## 2020-09-03 ENCOUNTER — Inpatient Hospital Stay (HOSPITAL_COMMUNITY): Payer: Medicaid Other

## 2020-09-03 LAB — BASIC METABOLIC PANEL
Anion gap: 7 (ref 5–15)
BUN: 10 mg/dL (ref 6–20)
CO2: 27 mmol/L (ref 22–32)
Calcium: 8.7 mg/dL — ABNORMAL LOW (ref 8.9–10.3)
Chloride: 98 mmol/L (ref 98–111)
Creatinine, Ser: 0.61 mg/dL (ref 0.61–1.24)
GFR, Estimated: >60 mL/min (ref >60)
Glucose, Bld: 108 mg/dL — ABNORMAL HIGH (ref 70–99)
Potassium: 3.9 mmol/L (ref 3.5–5.1)
Sodium: 132 mmol/L — ABNORMAL LOW (ref 135–145)

## 2020-09-03 LAB — CBC
HCT: 33.3 % — ABNORMAL LOW (ref 39.0–52.0)
Hemoglobin: 11 g/dL — ABNORMAL LOW (ref 13.0–17.0)
MCH: 31.9 pg (ref 26.0–34.0)
MCHC: 33 g/dL (ref 30.0–36.0)
MCV: 96.5 fL (ref 80.0–100.0)
Platelets: 172 10*3/uL (ref 150–400)
RBC: 3.45 MIL/uL — ABNORMAL LOW (ref 4.22–5.81)
RDW: 14.2 % (ref 11.5–15.5)
WBC: 12.5 10*3/uL — ABNORMAL HIGH (ref 4.0–10.5)
nRBC: 0 % (ref 0.0–0.2)

## 2020-09-03 LAB — GLUCOSE, CAPILLARY
Glucose-Capillary: 104 mg/dL — ABNORMAL HIGH (ref 70–99)
Glucose-Capillary: 75 mg/dL (ref 70–99)
Glucose-Capillary: 98 mg/dL (ref 70–99)
Glucose-Capillary: 79 mg/dL (ref 70–99)
Glucose-Capillary: 65 mg/dL — ABNORMAL LOW (ref 70–99)
Glucose-Capillary: 76 mg/dL (ref 70–99)

## 2020-09-03 MED ORDER — DEXTROSE 50 % IV SOLN: 12.5000 g | Freq: Once | INTRAVENOUS | Status: AC

## 2020-09-03 MED ORDER — OXYCODONE HCL 5 MG/5ML PO SOLN
15.0000 mg | Freq: Four times a day (QID) | ORAL | Status: DC | PRN
Start: 2020-09-03 — End: 2020-09-07
  Administered 2020-09-03 – 2020-09-06 (×10): 15 mg
  Filled 2020-09-03 (×10): qty 15

## 2020-09-03 MED ORDER — DEXTROSE 50 % IV SOLN
INTRAVENOUS | Status: AC
  Filled 2020-09-03: qty 50

## 2020-09-03 MED ORDER — FENTANYL 50 MCG/ML IV PCA SOLN
INTRAVENOUS | Status: DC
  Administered 2020-09-03: 50 ug via INTRAVENOUS
  Administered 2020-09-03: 105 ug via INTRAVENOUS
  Administered 2020-09-04: 240 ug via INTRAVENOUS
  Administered 2020-09-04: 105 ug via INTRAVENOUS
  Administered 2020-09-04: 50 ug via INTRAVENOUS
  Administered 2020-09-04: 90 ug via INTRAVENOUS
  Administered 2020-09-04: 210 ug via INTRAVENOUS
  Administered 2020-09-04: 240 ug via INTRAVENOUS
  Administered 2020-09-04: 180 ug via INTRAVENOUS
  Administered 2020-09-05: 165 ug via INTRAVENOUS
  Administered 2020-09-05: 405 ug via INTRAVENOUS
  Administered 2020-09-05: 240 ug via INTRAVENOUS
  Administered 2020-09-05: 75 ug via INTRAVENOUS
  Administered 2020-09-05: 180 ug/h via INTRAVENOUS
  Administered 2020-09-06: 150 ug via INTRAVENOUS
  Administered 2020-09-06: 15 ug via INTRAVENOUS
  Administered 2020-09-06: 120 ug via INTRAVENOUS
  Filled 2020-09-03 (×4): qty 20

## 2020-09-03 MED ORDER — IOHEXOL 300 MG/ML  SOLN
50.0000 mL | Freq: Once | INTRAMUSCULAR | Status: AC | PRN
  Administered 2020-09-03: 50 mL

## 2020-09-03 MED ORDER — SODIUM CHLORIDE 0.9% FLUSH: 9.0000 mL | INTRAVENOUS | Status: DC | PRN

## 2020-09-03 MED ORDER — DIATRIZOATE MEGLUMINE & SODIUM 66-10 % PO SOLN
ORAL | Status: AC
  Filled 2020-09-03: qty 30

## 2020-09-03 MED ORDER — DEXTROSE 50 % IV SOLN
INTRAVENOUS | Status: AC
  Administered 2020-09-03: 12.5 g via INTRAVENOUS
  Filled 2020-09-03: qty 50

## 2020-09-03 MED ORDER — NALOXONE HCL 0.4 MG/ML IJ SOLN
0.4000 mg | INTRAMUSCULAR | Status: DC | PRN
Start: 2020-09-03 — End: 2020-09-06

## 2020-09-03 MED ORDER — ONDANSETRON HCL 4 MG/2ML IJ SOLN: 4.0000 mg | Freq: Four times a day (QID) | INTRAMUSCULAR | Status: DC | PRN

## 2020-09-03 MED ORDER — SODIUM CHLORIDE 0.9 % IV SOLN: INTRAVENOUS | Status: DC

## 2020-09-03 NOTE — Progress Notes (Addendum)
      [address]       [name] [address]             [phone]      Contacted via nursing in regards to patient's pain management.  The patient continues to have severe pain despite increase in dosage this morning.  He continues to require IV Morphine every 1-2 hours.  He also continues to have green bilious drainage from around his Jejunostomy site, that is now soiling the bed.  A/P:  Will d/c IV Morphine and start PCA pump Due to continued bilious drainage around Jejunostomy tube, will continue to hold tube feeds.  IR consult has been placed for drain study which did not show evidence of leak.  Continue maintenance IV saline. Continue diligent skin care to prevent breakdown due to drainage  [name], PA-C

## 2020-09-03 NOTE — Plan of Care (Signed)
  Problem: Education: Goal: Knowledge of the prescribed therapeutic regimen will improve Outcome: Progressing   Problem: Bowel/Gastric: Goal: Gastrointestinal status for postoperative course will improve Outcome: Progressing   Problem: Nutritional: Goal: Ability to achieve adequate nutritional intake will improve Outcome: Progressing   Problem: Clinical Measurements: Goal: Postoperative complications will be avoided or minimized Outcome: Progressing   Problem: Respiratory: Goal: Ability to maintain a clear airway will improve Outcome: Progressing   Problem: Skin Integrity: Goal: Demonstration of wound healing without infection will improve Outcome: Progressing   Problem: Education: Goal: Knowledge of General Education information will improve Description: Including pain rating scale, medication(s)/side effects and non-pharmacologic comfort measures Outcome: Progressing   Problem: Health Behavior/Discharge Planning: Goal: Ability to manage health-related needs will improve Outcome: Progressing   Problem: Clinical Measurements: Goal: Ability to maintain clinical measurements within normal limits will improve Outcome: Progressing Goal: Will remain free from infection Outcome: Progressing Goal: Diagnostic test results will improve Outcome: Progressing Goal: Respiratory complications will improve Outcome: Progressing Goal: Cardiovascular complication will be avoided Outcome: Progressing   Problem: Nutrition: Goal: Adequate nutrition will be maintained Outcome: Progressing   Problem: Coping: Goal: Level of anxiety will decrease Outcome: Progressing   Problem: Elimination: Goal: Will not experience complications related to bowel motility Outcome: Progressing Goal: Will not experience complications related to urinary retention Outcome: Progressing

## 2020-09-03 NOTE — Progress Notes (Addendum)
[address]       [name] [address]             [phone]      2 Days Post-Op Procedure(s) (LRB): XI ROBOTIC ASSISTED IVOR LEWIS ESOPHAGECTOMY (N/A) ESOPHAGOGASTRODUODENOSCOPY (EGD) (N/A) INTERCOSTAL NERVE BLOCK (Right)  Subjective:  Patient is having a lot of pain.  He is not getting much relief with pain medication.  He denies N/V.  Objective: Vital signs in last 24 hours: Temp:  [97.7 F (36.5 C)-98 F (36.7 C)] 97.7 F (36.5 C) (07/30 0735) Pulse Rate:  [43-61] 59 (07/30 0329) Cardiac Rhythm: Sinus bradycardia (07/30 0429) Resp:  [11-20] 20 (07/30 0735) BP: (74-103)/(52-73) 90/64 (07/30 0329) SpO2:  [92 %-99 %] 94 % (07/30 0735)  Intake/Output from previous day: 07/29 0701 - 07/30 0700 In: 533.2 [NG/GT:413.2] Out: 1135 [Urine:500; Emesis/NG output:50; Drains:125; Chest Tube:460]  General appearance: alert, cooperative, and mild distress Heart: regular rate and rhythm Lungs: diminished breath sounds bibasilar Abdomen: soft and tender, bright green bile appearing contents leaking around G tube Extremities: extremities normal, atraumatic, no cyanosis or edema Wound: clean and dry  Lab Results: Recent Labs    09/01/20 1800 09/02/20 0511  WBC 10.9* 7.8  HGB 11.6* 10.7*  HCT 35.1* 32.4*  PLT 204 193   BMET:  Recent Labs    09/01/20 1800 09/02/20 0511  NA 133* 133*  K 4.0 4.0  CL 102 100  CO2 24 27  GLUCOSE 131* 103*  BUN 11 10  CREATININE 0.78 0.76  CALCIUM 9.0 8.8*    PT/INR: No results for input(s): LABPROT, INR in the last 72 hours. ABG    Component Value Date/Time   PHART 7.435 09/01/2020 2021   HCO3 25.5 09/01/2020 2021   TCO2 28 09/01/2020 1528   O2SAT 93.4 09/01/2020 2021   CBG (last 3)  Recent Labs    09/02/20 2334 09/03/20 0327 09/03/20 0758  GLUCAP 80 104* 75    Assessment/Plan: S/P Procedure(s) (LRB): XI ROBOTIC ASSISTED IVOR LEWIS ESOPHAGECTOMY (N/A) ESOPHAGOGASTRODUODENOSCOPY (EGD)  (N/A) INTERCOSTAL NERVE BLOCK (Right)  CV- Sinus Bradycardia, remains hypotensive- not on fluid regimen, may benefit from maintenance IV fluids as patient is NPO  Pulm- no acute issues, CT output 460 cc present, resolution of previous pneumothorax, + atelectasis present, sub q emphysema persists Renal- no labs ordered for this morning, have placed, will need to keep close watch on creatinine with scheduled Toradol and no IV fluids ordered GI- pneumoperitoneum improved on CXR, concerning bright green bile appearing drainage around G tube site, WOC obtained to keep barrier cream in place and keep dressing dry, will discuss need for further workup with Dr. Dorris Fetch, continue tube feeds for now, titrate slowly, planning for esophogram on Monday Pain control- patient not having any relief with current pain medications, I have increase dose of Oxycodone, continue prn Morphine, Toradol, if no relief may benefit from PCA CBGs controlled Lovenox for DVT prophylaxis   Addendum:  As discussed with Dr. Dorris Fetch will stop tube feedings.  Spoke with IR physician Dr. Miles Costain who states there is no staff in hospital to perform study.  He recommended stopping tube feedings and cover with dressing for now.  I have placed order for tube study.  Tube feedings have been stopped and he was started on IV fluids for now.     LOS: 2 days    [name], PA-C 09/03/2020  Patient seen and examined, agree with above. Bilious drainage around  J tube. TF held for now. Barrier cream applied, Site looks oK Will observe, if no further issues resume feeds at 2 PM at 1/2 rate  [place] C. Dorris Fetch, MD [facility] [phone]

## 2020-09-03 NOTE — Consult Note (Signed)
Chief Complaint: Leaking around J tube exit site. Request is for J tube evaluation   Referring Physician(s): E. Barrett PA  Supervising Physician: Ruel Favors  Patient Status: [facility] - In[facility]pt  History of Present Illness: Ralph [name] is a 55 y.o. male History of dysphagia due esophageal stricture s/p jejunostomy placement on 4.26.22 and esophagectomy on 7.28.22. Patient is now leaking around the jejunostomy tube exit site. Team is requesting jejunostomy tube evaluation.   Past Medical History:  Diagnosis Date   Anxiety    Coronary artery disease    Myocardial infarction [facility])    Stroke [facility])     Past Surgical History:  Procedure Laterality Date   APPENDECTOMY     BALLOON DILATION N/A 06/01/2019   Procedure: BALLOON DILATION;  Surgeon: Vida Rigger, MD;  Location: [facility] ENDOSCOPY;  Service: Endoscopy;  Laterality: N/A;   BALLOON DILATION N/A 07/16/2019   Procedure: BALLOON DILATION;  Surgeon: Vida Rigger, MD;  Location: [facility] ENDOSCOPY;  Service: Endoscopy;  Laterality: N/A;   BALLOON DILATION N/A 09/09/2019   Procedure: BALLOON DILATION;  Surgeon: Vida Rigger, MD;  Location: [facility]OSCOPY;  Service: Endoscopy;  Laterality: N/A;   BIOPSY  04/29/2019   Procedure: BIOPSY;  Surgeon: Charlott Rakes, MD;  Location: [facility] ENDOSCOPY;  Service: Endoscopy;;   BIOPSY  05/27/2019   Procedure: BIOPSY;  Surgeon: [name], MD;  Location: [facility] ENDOSCOPY;  Service: Endoscopy;;   BIOPSY  06/01/2019   Procedure: BIOPSY;  Surgeon: Vida Rigger, MD;  Location: [facility];  Service: Endoscopy;;   BIOPSY  07/16/2019   Procedure: BIOPSY;  Surgeon: Vida Rigger, MD;  Location: [facility]OS[facility];  Service: Endoscopy;;   BIOPSY  05/27/2020   Procedure: BIOPSY;  Surgeon: Napoleon Form, MD;  Location: [facility] ENDOSCOPY;  Service: Endoscopy;;   ESOPHAGOGASTRODUODENOSCOPY N/A 06/01/2019   Procedure: ESOPHAGOGASTRODUODENOSCOPY (EGD);  Surgeon: Vida Rigger, MD;  Location: [facility] ENDOSCOPY;  Service: Endoscopy;  Laterality:  N/A;   ESOPHAGOGASTRODUODENOSCOPY N/A 05/31/2020   Procedure: ESOPHAGOGASTRODUODENOSCOPY (EGD);  Surgeon: Corliss Skains, MD;  Location: [facility] OR;  Service: Thoracic;  Laterality: N/A;   ESOPHAGOGASTRODUODENOSCOPY N/A 09/01/2020   Procedure: ESOPHAGOGASTRODUODENOSCOPY (EGD);  Surgeon: Corliss Skains, MD;  Location: [facility] OR;  Service: Thoracic;  Laterality: N/A;   ESOPHAGOGASTRODUODENOSCOPY (EGD) WITH PROPOFOL N/A 04/29/2019   Procedure: ESOPHAGOGASTRODUODENOSCOPY (EGD) WITH PROPOFOL;  Surgeon: Charlott Rakes, MD;  Location: [facility] ENDOSCOPY;  Service: Endoscopy;  Laterality: N/A;   ESOPHAGOGASTRODUODENOSCOPY (EGD) WITH PROPOFOL N/A 05/27/2019   Procedure: ESOPHAGOGASTRODUODENOSCOPY (EGD) WITH PROPOFOL;  Surgeon: [name], MD;  [facility]: [facility]OS[facility];  Service: Endoscopy;  Laterality: N/A;   ESOPHAGOGASTRODUODENOSCOPY (EGD) WITH PROPOFOL N/A 07/16/2019   Procedure: ESOPHAGOGASTRODUODENOSCOPY (EGD) WITH PROPOFOL;  Surgeon: Vida Rigger, MD;  Location: [facility];  Service: Endoscopy;  Laterality: N/A;   ESOPHAGOGASTRODUODENOSCOPY (EGD) WITH PROPOFOL N/A 09/09/2019   Procedure: ESOPHAGOGASTRODUODENOSCOPY (EGD) WITH PROPOFOL;  Surgeon: Vida Rigger, MD;  [facility];  Service: Endoscopy;  Laterality: N/A;   ESOPHAGOGASTRODUODENOSCOPY (EGD) WITH PROPOFOL N/A 05/27/2020   Procedure: ESOPHAGOGASTRODUODENOSCOPY (EGD) WITH PROPOFOL;  Surgeon: Napoleon Form, MD;  Location: [facility] ENDOSCOPY;  Service: Endoscopy;  Laterality: N/A;   FINE NEEDLE ASPIRATION  09/09/2019   Procedure: FINE NEEDLE ASPIRATION (FNA) LINEAR;  Surgeon: Vida Rigger, MD;  Location: [facility] ENDOSCOPY;  Service: Endoscopy;;   FOREIGN BODY REMOVAL  07/16/2019   Procedure: FOREIGN BODY REMOVAL;  Surgeon: Vida Rigger, MD;  Location: [facility]OSCOPY;  Service: Endoscopy;;   FOREIGN BODY REMOVAL  05/27/2020   Procedure: FOREIGN BODY REMOVAL;  Surgeon: Napoleon FormNandigam, Kavitha V, MD;  [facility];  Service: Endoscopy;;   INTERCOSTAL  NERVE BLOCK Right 09/01/2020   Procedure: INTERCOSTAL NERVE BLOCK;  Surgeon: Corliss SkainsLightfoot, Harrell O, MD;  [facility];  Service: Thoracic;  Laterality: Right;   LAPAROSCOPIC JEJUNOSTOMY N/A 05/31/2020   Procedure: LAPAROSCOPIC JEJUNOSTOMY;  Surgeon: Corliss SkainsLightfoot, Harrell O, MD;  Location: [facility] OR;  Service: Thoracic;  Laterality: N/A;  EGD, C-arm required.   SHOULDER SURGERY     UPPER ESOPHAGEAL ENDOSCOPIC ULTRASOUND (EUS) N/A 09/09/2019   Procedure: UPPER ESOPHAGEAL ENDOSCOPIC ULTRASOUND (EUS);  Surgeon: Willis Modenautlaw, William, MD;  [facility];  Service: Endoscopy;  Laterality: N/A;    Allergies: Patient has no known allergies.  Medications: Prior to Admission medications   Medication Sig Start Date End Date Taking? Authorizing Provider  diclofenac Sodium (VOLTAREN) 1 % GEL Apply 2 g topically daily as needed (pain to left axillary region). 06/10/20  Yes Derrel Nipresenzo, Victor, MD  gabapentin (NEURONTIN) 250 MG/5ML solution Place 6 mLs (300 mg total) into feeding tube every 8 (eight) hours. 07/08/20  Yes Lightfoot, Eliezer LoftsHarrell O, MD  lidocaine (LIDODERM) 5 % Place 1 patch onto the skin daily. Remove & Discard patch within 12 hours or as directed by MD 06/14/20  Yes [name], Santiago BumpersWilliam A, MD  Nutritional Supplements (FEEDING SUPPLEMENT, JEVITY 1.2 CAL,) LIQD Place 1,000 mLs into feeding tube continuous. 06/10/20  Yes Derrel Nipresenzo, Victor, MD  oxyCODONE (ROXICODONE) 5 MG/5ML solution Place 5 mLs (5 mg total) into feeding tube at bedtime as needed for severe pain. Patient taking differently: Place 5 mg into feeding tube 2 (two) times daily as needed for severe pain. 07/08/20  Yes Lightfoot, Eliezer LoftsHarrell O, MD  polyvinyl alcohol (LIQUIFILM TEARS) 1.4 % ophthalmic solution Place 1 drop into both eyes as needed for dry eyes.   Yes [provider]  acetaminophen (TYLENOL) 160 MG/5ML suspension Place 20.3 mLs (650 mg total) into feeding tube every 6 (six) hours. Patient not taking: No sig reported 06/10/20   Derrel Nipresenzo, Victor,  MD  mupirocin ointment (BACTROBAN) 2 % Apply 1 application topically 2 (two) times daily. Patient not taking: No sig reported 06/23/20   Moses MannersHensel, William A, MD  pantoprazole sodium (PROTONIX) 40 mg/20 mL PACK Place 20 mLs (40 mg total) into feeding tube 2 (two) times daily. Patient not taking: No sig reported 06/10/20   Derrel Nipresenzo, Victor, MD  sennosides (SENOKOT) 8.8 MG/5ML syrup Place 5 mLs into feeding tube daily. Patient not taking: No sig reported 06/11/20   Derrel Nipresenzo, Victor, MD  sucralfate (CARAFATE) 1 GM/10ML suspension Place 10 mLs (1 g total) into feeding tube 4 (four) times daily -  with meals and at bedtime. Patient not taking: No sig reported 07/08/20   Corliss SkainsLightfoot, Harrell O, MD     Family History  Problem Relation Age of Onset   Healthy Mother    Heart attack Father     Social History   Socioeconomic History   Marital status: Single    Spouse name: Not on file   Number of children: Not on file   Years of education: Not on file   Highest education level: Not on file  Occupational History   Not on file  Tobacco Use   Smoking status: Every Day    Packs/day: 1.00    Years: 20.00    Pack years: 20.00    Types: Cigarettes   Smokeless tobacco: Never  Vaping Use   Vaping Use: Former  Substance and Sexual Activity   Alcohol use: Yes  Alcohol/week: 2.0 standard drinks    Types: 1 Cans of beer, 1 Standard drinks or equivalent per week    Comment: 1-2 cans of beer per week   Drug use: Not Currently   Sexual activity: Yes    Comment: with wife  Other Topics Concern   Not on file  Social History Narrative   Not on file   Social Determinants of Health   Financial Resource Strain: Not on file  Food Insecurity: Not on file  Transportation Needs: Not on file  Physical Activity: Not on file  Stress: Not on file  Social Connections: Not on file      Review of Systems: A 12 point ROS discussed and pertinent positives are indicated in the HPI above.  All other systems are  negative.  Review of Systems  Constitutional:  Negative for fever.  HENT:  Negative for congestion.   Respiratory:  Negative for cough and shortness of breath.   Cardiovascular:  Negative for chest pain.  Gastrointestinal:  Positive for abdominal pain.  Neurological:  Negative for headaches.  Psychiatric/Behavioral:  Negative for behavioral problems and confusion.    Vital Signs: BP 98/82 (BP Location: Left Arm)   Pulse 62   Temp 97.7 F (36.5 C) (Oral)   Resp 20   Ht  (1.727 m)   Wt 115 lb (52.2 kg)   SpO2 94%   BMI 17.49 kg/m   Physical Exam Vitals and nursing note reviewed.  Constitutional:      Appearance: He is well-developed.  HENT:     Head: Normocephalic.  Pulmonary:     Effort: Pulmonary effort is normal.  Abdominal:     Comments: 18 Fr jejunostomy present connect to kangaroo pump. No active leaking noted at time of assessment but green output noted to be on the dressing. Excoriation noted to the skin on the medial aspect of the jejunostomy tube. Balloon appears to be over inflated with  little movement that produces pain for the patient.   Musculoskeletal:        General: Normal range of motion.     Cervical back: Normal range of motion.  Skin:    General: Skin is dry.  Neurological:     Mental Status: He is alert and oriented to person, place, and time.    Imaging: DG Chest 2 View  Result Date: 08/31/2020 CLINICAL DATA:  Preoperative exam. EXAM: CHEST - 2 VIEW COMPARISON:  08/16/2020.  05/23/2020.  CT 08/25/2019. FINDINGS: Mediastinum and hilar structures normal. Heart size normal. COPD. No acute infiltrate. No pleural effusion or pneumothorax. Stable mild elevation left hemidiaphragm. Degenerative change thoracic spine. Abnormality. IMPRESSION: COPD. Stable mild elevation left hemidiaphragm. No acute cardiopulmonary disease. Electronically Signed   By: Maisie Fus  Register   On: 08/31/2020 08:33   DG Chest 2 View  Result Date: 08/17/2020 CLINICAL DATA:   55 year old male under preoperative evaluation prior to esophagectomy. EXAM: CHEST - 2 VIEW COMPARISON:  Chest x-ray 05/29/2020. FINDINGS: Lung volumes are normal. Mild emphysematous changes are noted. Diffuse peribronchial cuffing. No consolidative airspace disease. No pleural effusions. No pneumothorax. No pulmonary nodule or mass noted. Pulmonary vasculature and the cardiomediastinal silhouette are within normal limits. Atherosclerosis in the thoracic aorta. IMPRESSION: 1. No radiographic evidence of acute cardiopulmonary disease. 2. Diffuse peribronchial cuffing and emphysematous changes; imaging findings suggestive of underlying COPD. 3. Aortic atherosclerosis. Electronically Signed   By: Trudie Reed M.D.   On: 08/17/2020 12:17   DG ABDOMEN PEG TUBE LOCATION  Result  Date: 09/03/2020 CLINICAL DATA:  Jejunostomy tube leak. EXAM: ABDOMEN - 1 VIEW COMPARISON:  None. FINDINGS: A feeding tube terminates in small bowel loops in the pelvis, likely mid to distal jejunum. The study was obtained after injecting contrast. No evidence of leak is identified. Contrast fills adjacent loops of small bowel which are normal in caliber. IMPRESSION: No leak identified on this study. Electronically Signed   By: Gerome Sam III M.D   On: 09/03/2020 12:57   DG CHEST PORT 1 VIEW  Result Date: 09/03/2020 CLINICAL DATA:  Status post esophagectomy.  Former smoker. EXAM: PORTABLE CHEST 1 VIEW COMPARISON:  09/02/2020. FINDINGS: Stable position of right chest tube. No visible pneumothorax identified. Mediastinal drain is in place. NG tube tip is below the GE junction. Side port is at the level of the GE junction. Atelectasis noted within both lung bases. No interstitial edema or acute airspace consolidation. Pneumoperitoneum is improved from previous exam. Persistent right chest wall soft tissue emphysema. IMPRESSION: 1. Stable position of right chest tube. No visible pneumothorax. 2. Bibasilar atelectasis. 3. Decrease in  volume of pneumoperitoneum. Electronically Signed   By: Signa Kell M.D.   On: 09/03/2020 08:11   DG CHEST PORT 1 VIEW  Result Date: 09/02/2020 CLINICAL DATA:  History of esophagectomy EXAM: PORTABLE CHEST 1 VIEW COMPARISON:  Chest x-ray dated September 01, 2020 FINDINGS: Stable position of right IJ line and right-sided chest tubes. NG tube side port is at the GE junction, recommend advancement for optimal positioning. Stable cardiac and mediastinal contours. Unchanged bibasilar opacities, likely atelectasis. No new parenchymal opacity. No visible pneumothorax. No large pleural effusion. Redemonstrated pneumoperitoneum which appears decreased compared to prior exam. Unchanged subcutaneous gas of the right chest wall. IMPRESSION: No visible pneumothorax.  Right-sided chest tube is in place. Redemonstrated pneumoperitoneum, likely decreased compared to prior exam. Electronically Signed   By: Allegra Lai MD   On: 09/02/2020 11:02   DG Chest Port 1 View  Result Date: 09/01/2020 CLINICAL DATA:  Status post esophagectomy EXAM: PORTABLE CHEST 1 VIEW COMPARISON:  August 30, 2020 FINDINGS: Enteric tube terminates over the mid thorax, likely within the pull-through. RIGHT-sided chest tubes. RIGHT IJ CVC tip terminates over the inferior cavoatrial junction. Extensive pneumoperitoneum. Likely trace RIGHT apical pneumothorax. Small LEFT pleural effusion. Bibasilar predominantly linear opacities likely reflect atelectasis. Extensive RIGHT-sided subcutaneous air. IMPRESSION: 1. There is extensive pneumoperitoneum. This is most likely postsurgical in etiology, given recent esophagectomy. 2. Trace RIGHT apical pneumothorax with RIGHT-sided chest tubes in place. 3. Bibasilar heterogeneous opacities, likely atelectasis. Likely small LEFT pleural effusion. Electronically Signed   By: Meda Klinefelter MD   On: 09/01/2020 19:01    Labs:  CBC: Recent Labs    08/30/20 1400 09/01/20 0845 09/01/20 1528 09/01/20 1800  09/02/20 0511 09/03/20 0910  WBC 6.7  --   --  10.9* 7.8 12.5*  HGB 15.3   < > 11.9* 11.6* 10.7* 11.0*  HCT 44.4   < > 35.0* 35.1* 32.4* 33.3*  PLT 269  --   --  204 193 172   < > = values in this interval not displayed.    COAGS: Recent Labs    08/16/20 1151 08/30/20 1400  INR 0.9 0.9  APTT 33 35    BMP: Recent Labs    08/30/20 1400 09/01/20 0845 09/01/20 1528 09/01/20 1800 09/02/20 0511 09/03/20 0910  NA 131*   < > 135 133* 133* 132*  K 4.2   < > 4.4 4.0 4.0 3.9  CL 96*  --   --  102 100 98  CO2 27  --   --  24 27 27   GLUCOSE 121*  --   --  131* 103* 108*  BUN 11  --   --  11 10 10   CALCIUM 9.6  --   --  9.0 8.8* 8.7*  CREATININE 0.61  --   --  0.78 0.76 0.61  GFRNONAA >60  --   --  >60 >60 >60   < > = values in this interval not displayed.    LIVER FUNCTION TESTS: Recent Labs    05/23/20 2040 05/26/20 2059 08/16/20 1151 08/30/20 1400  BILITOT 1.2 0.8 0.7 0.5  AST 20 15 21 17   ALT 12 10 12 8   ALKPHOS 88 61 63 69  PROT 7.2 5.7* 6.7 7.2  ALBUMIN 3.9 3.0* 3.4* 3.7      Assessment and Plan:  55 y.o  male outpatient. History of dysphagia due esophageal stricture s/p jejunostomy placement on 4.26.22 and esophagectomy on 7.28.22. Patient is now leaking around the jejunostomy tube. Team is requesting jejunostomy tube evaluation.   Patient seen at bedside. 18 Fr jejunostomy present connect to kangaroo pump that is not running . No active leaking noted at time of assessment but green output noted to be on the dressing. Excoriation noted to the skin on the medial aspect of the jejunostomy tube. Retention balloon appears to be over inflated with little movement that when moved produces pain for the patient. Due to concerns for buried bumper syndrome 2 mls of normal saline was removed from the balloon retention.   The jejunostomy tube was able to flushed easily. Feeds  were resumed and the Patient was able to tolerate well. Xray ordered to confirm placement reads A  feeding tube terminates in small bowel loops in the pelvis, likely mid to distal jejunum. The study was obtained after injecting contrast. No evidence of leak is identified. Contrast fills adjacent loops of small bowel which are normal in caliber.  Jejunostomy tube appears to be in good position and functional. Okay to use.   Thank you for this interesting consult.  I greatly enjoyed meeting Ralph [name] and look forward to participating in their care.  A copy of this report was sent to the requesting provider on this date.  Electronically Signed: 44, NP 09/03/2020, 4:32 PM   I spent a total of 40 Minutes    in face to face in clinical consultation, greater than 50% of which was counseling/coordinating care for jejunostomy tube evaluation

## 2020-09-03 NOTE — Progress Notes (Addendum)
Spoke with Chales Salmon, PA about patient J tube still saturating dressing. RN advised to continue to hold tube feed. RN will continue to monitor.

## 2020-09-03 NOTE — Progress Notes (Signed)
Per Dr. Dorris Fetch resume tube feeds around 1400 at half rate of 5mL/per hour. RN will resume per verbal order at 1400.

## 2020-09-04 ENCOUNTER — Inpatient Hospital Stay (HOSPITAL_COMMUNITY): Payer: Medicaid Other

## 2020-09-04 LAB — CBC
HCT: 31.1 % — ABNORMAL LOW (ref 39.0–52.0)
Hemoglobin: 10.2 g/dL — ABNORMAL LOW (ref 13.0–17.0)
MCH: 31.9 pg (ref 26.0–34.0)
MCHC: 32.8 g/dL (ref 30.0–36.0)
MCV: 97.2 fL (ref 80.0–100.0)
Platelets: 185 10*3/uL (ref 150–400)
RBC: 3.2 MIL/uL — ABNORMAL LOW (ref 4.22–5.81)
RDW: 14.3 % (ref 11.5–15.5)
WBC: 11.1 10*3/uL — ABNORMAL HIGH (ref 4.0–10.5)
nRBC: 0 % (ref 0.0–0.2)

## 2020-09-04 LAB — GLUCOSE, CAPILLARY
Glucose-Capillary: 57 mg/dL — ABNORMAL LOW (ref 70–99)
Glucose-Capillary: 74 mg/dL (ref 70–99)
Glucose-Capillary: 78 mg/dL (ref 70–99)
Glucose-Capillary: 78 mg/dL (ref 70–99)
Glucose-Capillary: 78 mg/dL (ref 70–99)
Glucose-Capillary: 80 mg/dL (ref 70–99)
Glucose-Capillary: 83 mg/dL (ref 70–99)
Glucose-Capillary: 88 mg/dL (ref 70–99)

## 2020-09-04 LAB — BASIC METABOLIC PANEL
Anion gap: 3 — ABNORMAL LOW (ref 5–15)
BUN: 9 mg/dL (ref 6–20)
CO2: 26 mmol/L (ref 22–32)
Calcium: 8.3 mg/dL — ABNORMAL LOW (ref 8.9–10.3)
Chloride: 104 mmol/L (ref 98–111)
Creatinine, Ser: 0.6 mg/dL — ABNORMAL LOW (ref 0.61–1.24)
GFR, Estimated: >60 mL/min (ref >60)
Glucose, Bld: 86 mg/dL (ref 70–99)
Potassium: 3.8 mmol/L (ref 3.5–5.1)
Sodium: 133 mmol/L — ABNORMAL LOW (ref 135–145)

## 2020-09-04 MED ORDER — SODIUM CHLORIDE 0.9 % IV SOLN: INTRAVENOUS | Status: DC

## 2020-09-04 MED ORDER — JEVITY 1.2 CAL PO LIQD
1000.0000 mL | ORAL | Status: DC
  Administered 2020-09-04 – 2020-09-05 (×2): 1000 mL
  Filled 2020-09-04 (×2): qty 1000

## 2020-09-04 MED ORDER — DEXTROSE 50 % IV SOLN: 12.5000 g | Freq: Once | INTRAVENOUS | Status: AC

## 2020-09-04 MED ORDER — DEXTROSE 50 % IV SOLN
INTRAVENOUS | Status: AC
  Administered 2020-09-04: 25 mL
  Filled 2020-09-04: qty 50

## 2020-09-04 NOTE — Progress Notes (Addendum)
      [address]       Ralph [name] [address]             [phone]      3 Days Post-Op Procedure(s) (LRB): XI ROBOTIC ASSISTED IVOR LEWIS ESOPHAGECTOMY (N/A) ESOPHAGOGASTRODUODENOSCOPY (EGD) (N/A) INTERCOSTAL NERVE BLOCK (Right)  Subjective:  Patient doing better this morning.  States his pain is under much better control after PCA started.  He denies N/V.  He does mention that most of his drain sites are leaking.  Objective: Vital signs in last 24 hours: Temp:  [97.7 F (36.5 C)-98 F (36.7 C)] 97.7 F (36.5 C) (07/31 0301) Pulse Rate:  [47-62] 47 (07/31 0301) Cardiac Rhythm: Sinus bradycardia (07/31 0700) Resp:  [11-20] 15 (07/31 0747) BP: (86-106)/(59-82) 106/66 (07/31 0301) SpO2:  [89 %-99 %] 89 % (07/31 0747)  Intake/Output from previous day: 07/30 0701 - 07/31 0700 In: 1656.7 [I.V.:1190.2; NG/GT:206.5] Out: 1700 [Urine:650; Emesis/NG output:600; Drains:220; Chest Tube:230]  General appearance: alert, cooperative, and no distress Heart: regular rate and rhythm Lungs: diminished breath sounds bilaterally Abdomen: soft, remains tender... bilious drainage around J tube persists Wound: clean, CT site with serous drainage around tube  Lab Results: Recent Labs    09/03/20 0910 09/04/20 0426  WBC 12.5* 11.1*  HGB 11.0* 10.2*  HCT 33.3* 31.1*  PLT 172 185   BMET:  Recent Labs    09/03/20 0910 09/04/20 0426  NA 132* 133*  K 3.9 3.8  CL 98 104  CO2 27 26  GLUCOSE 108* 86  BUN 10 9  CREATININE 0.61 0.60*  CALCIUM 8.7* 8.3*    PT/INR: No results for input(s): LABPROT, INR in the last 72 hours. ABG    Component Value Date/Time   PHART 7.435 09/01/2020 2021   HCO3 25.5 09/01/2020 2021   TCO2 28 09/01/2020 1528   O2SAT 93.4 09/01/2020 2021   CBG (last 3)  Recent Labs    09/04/20 0425 09/04/20 0720 09/04/20 0813  GLUCAP 78 57* 83    Assessment/Plan: S/P Procedure(s) (LRB): XI ROBOTIC ASSISTED IVOR LEWIS ESOPHAGECTOMY  (N/A) ESOPHAGOGASTRODUODENOSCOPY (EGD) (N/A) INTERCOSTAL NERVE BLOCK (Right)  Sinus Bradycardia, BP remains low but is stable Pulm- 230 cc output from chest tube, CXR remains stable with atelectasis, sub q emphysema Renal- creatinine, lytes remain normal, on IV fluids currently will stop once tube feeds resumed GI-remains NPO, NG tube output has increased had 600 cc output yesterday, J tube continues to have bilious drainage around site, will discuss resumption of tube feeds with Dr. Dorris Fetch, for swallow study tomorrow Pain control- much improved with PCA, continue for now, told patient this will be discontinued once chest tubes and drains get removed CBGS controlled Lovenox for DVT prophylaxis Mobility- patient not walking with nursing despite attempts, will get PT consult to help get patient mobilized    LOS: 3 days    [name], PA-C 09/04/2020 Patient seen and examined, agree with above Tube in proper position but still a lot of bilious drainage around it. NG output up. Per report balloon on JT was reinflated yesterday, I suspect may be causing some obstruction Will deflate the balloon and resume Tf at reduced rate Serosanguinous drainage from chest drains Mobilize  Shams Fill C. Dorris Fetch, MD [facility] [phone]

## 2020-09-04 NOTE — Progress Notes (Signed)
Hypoglycemic Event  CBG: 57  Treatment: D50 50 mL (25 gm)  Symptoms: None  Follow-up CBG: [id number] CBG Result:83  Possible Reasons for Event: Other:    Tube feeding temp off  Comments/MD notified: [name], PA    Marlou Sa

## 2020-09-05 ENCOUNTER — Inpatient Hospital Stay (HOSPITAL_COMMUNITY): Payer: Medicaid Other

## 2020-09-05 LAB — BASIC METABOLIC PANEL
Anion gap: 5 (ref 5–15)
BUN: 9 mg/dL (ref 6–20)
CO2: 26 mmol/L (ref 22–32)
Calcium: 8.3 mg/dL — ABNORMAL LOW (ref 8.9–10.3)
Chloride: 102 mmol/L (ref 98–111)
Creatinine, Ser: 0.58 mg/dL — ABNORMAL LOW (ref 0.61–1.24)
GFR, Estimated: >60 mL/min (ref >60)
Glucose, Bld: 91 mg/dL (ref 70–99)
Potassium: 3.7 mmol/L (ref 3.5–5.1)
Sodium: 133 mmol/L — ABNORMAL LOW (ref 135–145)

## 2020-09-05 LAB — CBC
HCT: 31.3 % — ABNORMAL LOW (ref 39.0–52.0)
Hemoglobin: 10.4 g/dL — ABNORMAL LOW (ref 13.0–17.0)
MCH: 31.8 pg (ref 26.0–34.0)
MCHC: 33.2 g/dL (ref 30.0–36.0)
MCV: 95.7 fL (ref 80.0–100.0)
Platelets: 204 10*3/uL (ref 150–400)
RBC: 3.27 MIL/uL — ABNORMAL LOW (ref 4.22–5.81)
RDW: 14.1 % (ref 11.5–15.5)
WBC: 9 10*3/uL (ref 4.0–10.5)
nRBC: 0 % (ref 0.0–0.2)

## 2020-09-05 LAB — GLUCOSE, CAPILLARY
Glucose-Capillary: 102 mg/dL — ABNORMAL HIGH (ref 70–99)
Glucose-Capillary: 106 mg/dL — ABNORMAL HIGH (ref 70–99)
Glucose-Capillary: 82 mg/dL (ref 70–99)
Glucose-Capillary: 89 mg/dL (ref 70–99)
Glucose-Capillary: 99 mg/dL (ref 70–99)

## 2020-09-05 LAB — SURGICAL PATHOLOGY

## 2020-09-05 MED ORDER — IOHEXOL 300 MG/ML  SOLN
100.0000 mL | Freq: Once | INTRAMUSCULAR | Status: AC | PRN
  Administered 2020-09-05: 100 mL via ORAL

## 2020-09-05 NOTE — TOC Progression Note (Signed)
Transition of Care [facility]) - Progression Note    Patient Details  Name: Ralph [name] MRN: [id number] Date of Birth: 1965-05-30  [facility] CM/SW Contact  Leone Haven, RN Phone Number: 09/05/2020, 3:46 PM  Clinical Narrative:    S/p esphagectomy, conts on pca pump, for swallow study today, NG tube in place.  TOC will continue to follow for dc needs.        Expected Discharge Plan and Services                                                 Social Determinants of Health (SDOH) Interventions    Readmission Risk Interventions No flowsheet data found.

## 2020-09-05 NOTE — Evaluation (Signed)
Physical Therapy Evaluation Patient Details Name: Ralph [name] MRN: [id number] DOB: 1966/01/03 Today's Date: 09/05/2020   History of Present Illness  pt is a 55 y/o male admitted 7/28 for an Ivor-Lewis esophagectomy due to benign lower esophageal stricture and protein malnutrition.   PMHx: anxiety, CAD, MI, Stroke, multiple ballon dilations to esophageal stricture.  Clinical Impression  Pt admitted with/for surgical management of lower esophageal stricture.  Pt in significant pain and.  Pt currently limited functionally due to the problems listed below.  (see problems list.)  Pt will benefit from PT to maximize function and safety to be able to get home safely with available assist .     Follow Up Recommendations Home health PT;Supervision/Assistance - 24 hour;Other (comment) (If no initial 24*assist, then TBD)    Equipment Recommendations  Rolling walker with 5" wheels;Other (comment) (TBA)    Recommendations for Other Services       Precautions / Restrictions Precautions Precautions: Fall      Mobility  Bed Mobility Overal bed mobility: Needs Assistance Bed Mobility: Rolling;Sidelying to Sit;Sit to Supine Rolling: Mod assist;+2 for safety/equipment Sidelying to sit: Mod assist;+2 for safety/equipment   Sit to supine: Mod assist;+2 for physical assistance   General bed mobility comments: cues for sequence/direction, truncal assist to roll and come up to sitting, pt only able to squeeze the pillow to his trunk.    Transfers Overall transfer level: Needs assistance   Transfers: Sit to/from Stand;Lateral/Scoot Transfers Sit to Stand: Mod assist;+2 safety/equipment (x4)        Lateral/Scoot Transfers: Mod assist (x2) General transfer comment: cues for direction, assist forward and boost.  Ambulation/Gait             General Gait Details: NT  Stairs            Wheelchair Mobility    Modified Rankin (Stroke Patients Only)       Balance Overall balance  assessment: Needs assistance Sitting-balance support: Single extremity supported;No upper extremity supported;Feet supported Sitting balance-Leahy Scale: Fair Sitting balance - Comments: balance not challenged.  pt able to sit EOB without UE's or external assist   Standing balance support: During functional activity Standing balance-Leahy Scale: Poor Standing balance comment: pt reliant on external support.                             Pertinent Vitals/Pain Pain Assessment: Faces Faces Pain Scale: Hurts whole lot Pain Location: abdomen/flanks/general Pain Descriptors / Indicators: Burning;Sore Pain Intervention(s): Limited activity within patient's tolerance;Monitored during session;Premedicated before session;PCA encouraged    Home Living Family/patient expects to be discharged to:: Private residence Living Arrangements: Spouse/significant other Available Help at Discharge: Family;Available PRN/intermittently Type of Home: Other(Comment) Home Access: Stairs to enter     Home Layout: One level        Prior Function Level of Independence: Independent         Comments: Independent with ADLs, IADLs, and mobility.     Hand Dominance   Dominant Hand: Right    Extremity/Trunk Assessment   Upper Extremity Assessment Upper Extremity Assessment: Generalized weakness    Lower Extremity Assessment Lower Extremity Assessment: Generalized weakness    Cervical / Trunk Assessment Cervical / Trunk Assessment: Kyphotic  Communication   Communication: No difficulties  Cognition Arousal/Alertness: Awake/alert Behavior During Therapy: Agitated;Anxious Overall Cognitive Status: No family/caregiver present to determine baseline cognitive functioning  General Comments General comments (skin integrity, edema, etc.): vss in general    Exercises     Assessment/Plan    PT Assessment Patient needs continued PT  services  PT Problem List Decreased strength;Decreased activity tolerance;Decreased balance;Decreased mobility;Decreased knowledge of use of DME;Pain       PT Treatment Interventions DME instruction;Gait training;Functional mobility training;Therapeutic activities;Balance training;Patient/family education;Therapeutic exercise    PT Goals (Current goals can be found in the Care Plan section)  Acute Rehab PT Goals Patient Stated Goal: reduce this pain. PT Goal Formulation: With patient Time For Goal Achievement: 09/19/20 Potential to Achieve Goals: Fair    Frequency Min 3X/week   Barriers to discharge        Co-evaluation               AM-PAC PT "6 Clicks" Mobility  Outcome Measure Help needed turning from your back to your side while in a flat bed without using bedrails?: A Lot Help needed moving from lying on your back to sitting on the side of a flat bed without using bedrails?: A Lot Help needed moving to and from a bed to a chair (including a wheelchair)?: A Lot Help needed standing up from a chair using your arms (e.g., wheelchair or bedside chair)?: A Lot Help needed to walk in hospital room?: A Lot Help needed climbing 3-5 steps with a railing? : A Lot 6 Click Score: 12    End of Session   Activity Tolerance: Patient limited by pain Patient left: in bed;with call bell/phone within reach Nurse Communication: Mobility status PT Visit Diagnosis: Other abnormalities of gait and mobility (R26.89);Muscle weakness (generalized) (M62.81);Pain Pain - part of body:  (trunk/abdomen)    Time: 1245-8099 PT Time Calculation (min) (ACUTE ONLY): 32 min   Charges:   PT Evaluation $PT Eval Moderate Complexity: 1 Mod PT Treatments $Therapeutic Activity: 8-22 mins        09/05/2020  Jacinto Halim., PT Acute Rehabilitation Services [phone]  (pager) [phone]  (office)  [name] 09/05/2020, 5:30 PM

## 2020-09-05 NOTE — Progress Notes (Addendum)
      [address]       [name] [address]             [phone]      4 Days Post-Op Procedure(s) (LRB): XI ROBOTIC ASSISTED IVOR LEWIS ESOPHAGECTOMY (N/A) ESOPHAGOGASTRODUODENOSCOPY (EGD) (N/A) INTERCOSTAL NERVE BLOCK (Right) Subjective: His biggest concern is the bilious drainage around his J tube. Pain is better controlled with PCA  Objective: Vital signs in last 24 hours: Temp:  [98.2 F (36.8 C)-98.5 F (36.9 C)] 98.5 F (36.9 C) (08/01 0333) Pulse Rate:  [49-55] 49 (08/01 0333) Cardiac Rhythm: Sinus bradycardia (08/01 0700) Resp:  [12-22] 19 (08/01 0351) BP: (101-110)/(67-77) 101/77 (08/01 0333) SpO2:  [89 %-100 %] 96 % (08/01 0351)     Intake/Output from previous day: 07/31 0701 - 08/01 0700 In: 887.1 [I.V.:370.6; NG/GT:396.5] Out: 1190 [Urine:100; Emesis/NG output:750; Drains:330; Chest Tube:10] Intake/Output this shift: No intake/output data recorded.  General appearance: alert, cooperative, and no distress Heart: regular rate and rhythm, S1, S2 normal, no murmur, click, rub or gallop Lungs: clear to auscultation bilaterally Abdomen: soft, tender; bowel sounds normal Extremities: extremities normal, atraumatic, no cyanosis or edema Wound: bilious drainage around the j tube  Lab Results: Recent Labs    09/04/20 0426 09/05/20 0359  WBC 11.1* 9.0  HGB 10.2* 10.4*  HCT 31.1* 31.3*  PLT 185 204   BMET:  Recent Labs    09/04/20 0426 09/05/20 0359  NA 133* 133*  K 3.8 3.7  CL 104 102  CO2 26 26  GLUCOSE 86 91  BUN 9 9  CREATININE 0.60* 0.58*  CALCIUM 8.3* 8.3*    PT/INR: No results for input(s): LABPROT, INR in the last 72 hours. ABG    Component Value Date/Time   PHART 7.435 09/01/2020 2021   HCO3 25.5 09/01/2020 2021   TCO2 28 09/01/2020 1528   O2SAT 93.4 09/01/2020 2021   CBG (last 3)  Recent Labs    09/04/20 2021 09/04/20 2355 09/05/20 0331  GLUCAP 78 78 82    Assessment/Plan: S/P Procedure(s) (LRB): XI  ROBOTIC ASSISTED IVOR LEWIS ESOPHAGECTOMY (N/A) ESOPHAGOGASTRODUODENOSCOPY (EGD) (N/A) INTERCOSTAL NERVE BLOCK (Right)  Sinus Bradycardia, BP remains low but is stable Pulm- no drainage recorded from the chest tube, CXR remains stable with right base atelectasis and pneumoperitoneum, likely postsurgical Renal- creatinine 0.58,  lytes remain normal, on IV fluids currently will stop once tube feeds resumed GI-remains NPO, NG tube output has increased had 750cc output yesterday, J tube continues to have bilious drainage around site, TF started at a reduced rate Pain control- much improved with PCA, continue for now CBGS controlled Lovenox for DVT prophylaxis Mobility- patient not walking with nursing despite attempts, will get PT consult to help get patient mobilized  Plan: Swallow study scheduled for today. Re-dressing J tube this morning, it is still leak bilious drainage with the balloon down. May need repositioned. Working on pain control-patient states the PCA is helping.    LOS: 4 days    Sharlene Dory 09/05/2020  Agree with above.  Swallow study essentially negative.  Patient does continue to have significant NG tube output.  We will keep tube in place for now while we start promotility agents. Continue physical therapy Will hold off on starting diet for now.  Tasean Mancha Keane Scrape

## 2020-09-05 NOTE — Plan of Care (Signed)
  Problem: Education: Goal: Knowledge of the prescribed therapeutic regimen will improve Outcome: Progressing   Problem: Nutritional: Goal: Ability to achieve adequate nutritional intake will improve Outcome: Progressing   Problem: Clinical Measurements: Goal: Postoperative complications will be avoided or minimized Outcome: Progressing   Problem: Skin Integrity: Goal: Demonstration of wound healing without infection will improve Outcome: Progressing   Problem: Nutrition: Goal: Adequate nutrition will be maintained Outcome: Progressing   Problem: Coping: Goal: Level of anxiety will decrease Outcome: Progressing   Problem: Elimination: Goal: Will not experience complications related to urinary retention Outcome: Progressing   Problem: Pain Managment: Goal: General experience of comfort will improve Outcome: Progressing   Problem: Skin Integrity: Goal: Risk for impaired skin integrity will decrease Outcome: Progressing

## 2020-09-06 LAB — CBC
HCT: 34.8 % — ABNORMAL LOW (ref 39.0–52.0)
Hemoglobin: 11.3 g/dL — ABNORMAL LOW (ref 13.0–17.0)
MCH: 31 pg (ref 26.0–34.0)
MCHC: 32.5 g/dL (ref 30.0–36.0)
MCV: 95.3 fL (ref 80.0–100.0)
Platelets: 281 10*3/uL (ref 150–400)
RBC: 3.65 MIL/uL — ABNORMAL LOW (ref 4.22–5.81)
RDW: 14.4 % (ref 11.5–15.5)
WBC: 12.7 10*3/uL — ABNORMAL HIGH (ref 4.0–10.5)
nRBC: 0 % (ref 0.0–0.2)

## 2020-09-06 LAB — GLUCOSE, CAPILLARY
Glucose-Capillary: 106 mg/dL — ABNORMAL HIGH (ref 70–99)
Glucose-Capillary: 107 mg/dL — ABNORMAL HIGH (ref 70–99)
Glucose-Capillary: 35 mg/dL — CL (ref 70–99)
Glucose-Capillary: 66 mg/dL — ABNORMAL LOW (ref 70–99)
Glucose-Capillary: 78 mg/dL (ref 70–99)
Glucose-Capillary: 90 mg/dL (ref 70–99)
Glucose-Capillary: 97 mg/dL (ref 70–99)
Glucose-Capillary: 97 mg/dL (ref 70–99)

## 2020-09-06 LAB — BASIC METABOLIC PANEL
Anion gap: 7 (ref 5–15)
BUN: 14 mg/dL (ref 6–20)
CO2: 23 mmol/L (ref 22–32)
Calcium: 8.2 mg/dL — ABNORMAL LOW (ref 8.9–10.3)
Chloride: 103 mmol/L (ref 98–111)
Creatinine, Ser: 0.76 mg/dL (ref 0.61–1.24)
GFR, Estimated: >60 mL/min (ref >60)
Glucose, Bld: 105 mg/dL — ABNORMAL HIGH (ref 70–99)
Potassium: 3.9 mmol/L (ref 3.5–5.1)
Sodium: 133 mmol/L — ABNORMAL LOW (ref 135–145)

## 2020-09-06 MED ORDER — FREE WATER
100.0000 mL | Status: DC
  Administered 2020-09-06: 100 mL

## 2020-09-06 MED ORDER — JEVITY 1.5 CAL/FIBER PO LIQD
1000.0000 mL | ORAL | Status: DC
  Administered 2020-09-06: 1000 mL
  Filled 2020-09-06 (×3): qty 1000

## 2020-09-06 MED ORDER — PROSOURCE TF PO LIQD
45.0000 mL | Freq: Two times a day (BID) | ORAL | Status: DC
  Administered 2020-09-06: 45 mL

## 2020-09-06 MED ORDER — DEXTROSE 50 % IV SOLN
INTRAVENOUS | Status: AC
  Administered 2020-09-06: 12.5 g via INTRAVENOUS
  Filled 2020-09-06: qty 100

## 2020-09-06 MED ORDER — DEXTROSE 50 % IV SOLN
12.5000 g | INTRAVENOUS | Status: AC
  Administered 2020-09-06: 12.5 g via INTRAVENOUS

## 2020-09-06 MED ORDER — METOCLOPRAMIDE HCL 5 MG/ML IJ SOLN
10.0000 mg | Freq: Four times a day (QID) | INTRAMUSCULAR | Status: DC
Start: 2020-09-06 — End: 2020-09-28
  Administered 2020-09-06 – 2020-09-27 (×84): 10 mg via INTRAVENOUS
  Filled 2020-09-06 (×83): qty 2

## 2020-09-06 MED ORDER — MORPHINE SULFATE (PF) 2 MG/ML IV SOLN
2.0000 mg | INTRAVENOUS | Status: DC | PRN
Start: 2020-09-06 — End: 2020-09-07
  Administered 2020-09-06 – 2020-09-07 (×7): 2 mg via INTRAVENOUS
  Filled 2020-09-06 (×8): qty 1

## 2020-09-06 MED ORDER — DEXTROSE-NACL 5-0.45 % IV SOLN: INTRAVENOUS | Status: DC

## 2020-09-06 MED ORDER — PANTOPRAZOLE SODIUM 40 MG IV SOLR
40.0000 mg | Freq: Once | INTRAVENOUS | Status: AC
  Administered 2020-09-07: 40 mg via INTRAVENOUS
  Filled 2020-09-06: qty 40

## 2020-09-06 NOTE — Progress Notes (Signed)
Remaining fentanyl from PCA wasted in stericycle with Britta Mccreedy, RN on unit. Wasted 1.5 mL.

## 2020-09-06 NOTE — Progress Notes (Addendum)
      [address]       [place] [address]             [phone]      5 Days Post-Op Procedure(s) (LRB): XI ROBOTIC ASSISTED IVOR LEWIS ESOPHAGECTOMY (N/A) ESOPHAGOGASTRODUODENOSCOPY (EGD) (N/A) INTERCOSTAL NERVE BLOCK (Right) Subjective: Coughing up phlegm this morning, states he has pain on his right side around his incisions.   Objective: Vital signs in last 24 hours: Temp:  [97.6 F (36.4 C)-98.4 F (36.9 C)] 97.6 F (36.4 C) (08/02 0353) Pulse Rate:  [49-91] 71 (08/02 0353) Cardiac Rhythm: Heart block (08/02 0700) Resp:  [14-28] 19 (08/02 0353) BP: (89-160)/(53-94) 90/61 (08/02 0353) SpO2:  [84 %-98 %] 94 % (08/02 0351) FiO2 (%):  [0 %] 0 % (08/01 1432)     Intake/Output from previous day: 08/01 0701 - 08/02 0700 In: 60  Out: 1895 [Urine:750; Emesis/NG output:400; Drains:655; Chest Tube:90] Intake/Output this shift: No intake/output data recorded.  General appearance: alert, cooperative, and no distress Heart: regular rate and rhythm, S1, S2 normal, no murmur, click, rub or gallop Lungs: clear to auscultation bilaterally Abdomen: soft, non-tender; bowel sounds normal; no masses,  no organomegaly Extremities: extremities normal, atraumatic, no cyanosis or edema Wound: clean and dry covered with a clean dressing  Lab Results: Recent Labs    09/05/20 0359 09/06/20 0455  WBC 9.0 12.7*  HGB 10.4* 11.3*  HCT 31.3* 34.8*  PLT 204 281   BMET:  Recent Labs    09/05/20 0359 09/06/20 0455  NA 133* 133*  K 3.7 3.9  CL 102 103  CO2 26 23  GLUCOSE 91 105*  BUN 9 14  CREATININE 0.58* 0.76  CALCIUM 8.3* 8.2*    PT/INR: No results for input(s): LABPROT, INR in the last 72 hours. ABG    Component Value Date/Time   PHART 7.435 09/01/2020 2021   HCO3 25.5 09/01/2020 2021   TCO2 28 09/01/2020 1528   O2SAT 93.4 09/01/2020 2021   CBG (last 3)  Recent Labs    09/05/20 2021 09/05/20 2336 09/06/20 0411  GLUCAP 89 102* 106*     Assessment/Plan: S/P Procedure(s) (LRB): XI ROBOTIC ASSISTED IVOR LEWIS ESOPHAGECTOMY (N/A) ESOPHAGOGASTRODUODENOSCOPY (EGD) (N/A) INTERCOSTAL NERVE BLOCK (Right)  NSR in the 70s with occasional SB. BP remains low but is stable Pulm- 90cc out of the chest tube. Will order another CXR for tomorrow.  Renal- creatinine 0.76,  lytes remain normal GI-remains NPO, NG tube output has decreased to 400cc output yesterday, J tube continues to have bilious drainage around site-continue dressing changes, looks more dry this morning, TF @ 30 Pain control- much improved with PCA, continue for now CBGS controlled Lovenox for DVT prophylaxis Mobility- patient not walking with nursing despite attempts, will get PT consult to help get patient mobilized   Plan:  Keep NG in place due to output. Continue PT. Will defer to Dr. Cliffton Asters on timing of starting PO. Swallow study negative. Needs to ambulate more in the halls. Stop PCA.    LOS: 5 days    Sharlene Dory 09/06/2020  Patient now has bilious output draining from his pleural chest tube.  This is concerning for anastomotic leak.  We will plan for endoscopy, and reexploration tomorrow.  Nayib Remer Keane Scrape

## 2020-09-06 NOTE — Progress Notes (Signed)
Pt PEG site expelling great amounts of bile and now curdled tube feed. Dressing around PEG removed and packed with towels. Jari Favre notified and said will confer with Dr. Cliffton Asters.

## 2020-09-06 NOTE — Progress Notes (Signed)
Nutrition Follow-up  DOCUMENTATION CODES:   Underweight, Severe malnutrition in context of chronic illness  INTERVENTION:   - Please obtain updated weight  Tube feeding via J-tube: - Change to Jevity 1.5 @ 50 ml/hr (1200 ml/day) - ProSource TF 45 ml BID - Free water flushes of 100 ml q 4 hours  Tube feeding regimen provides 1880 kcal, 99 grams of protein, and 912 ml of H2O.   Total free water with flushes: 1512 ml  NUTRITION DIAGNOSIS:   Severe Malnutrition related to chronic illness (recurrent esophageal stricture s/p J-tube) as evidenced by severe muscle depletion, severe fat depletion.  Ongoing, being addressed via TF  GOAL:   Patient will meet greater than or equal to 90% of their needs  Met via TF at new goal rate  MONITOR:   Diet advancement, Labs, Weight trends, TF tolerance, I & O's  REASON FOR ASSESSMENT:   Consult Enteral/tube feeding initiation and management  ASSESSMENT:   55 year old male who presented on 7/28 for esophagogastroduodenoscopy and robotic assisted esophagectomy. PMH of esophageal stricture s/p multiple dilations and J-tube placement in April 2022, stroke, CAD, MI, anxiety.  7/30 - tube feeds held due to drainage around J-tube 7/31 - tube feeds resumed at lower rate 8/01 - swallow study negative  Per notes, pt with bilious drainage from around J-tube and from around pleural chest tube. Pt's J-tube balloon has been inflated and deflated and was assessed by IR on 7/30. Per TCTS note today, pt having less drainage from around J-tube today. Noted concern for anastomotic leak and plan for endoscopy, re-exploration tomorrow.  Pt is severely malnourished and has not been receiving full tube feeds since admission (x 5 days) due to drainage from around J-tube. Jevity 1.2 infusing at 30 ml/hr at this time with goal rate being 70 ml/hr. Discussed increasing tube feeding rate with TCTS PA. Will change tube feeding to Jevity 1.5 and increase rate to 50  ml/hr which will be pt's goal rate on this more concentrated formula. Discussed plan with RN. Will also add free water flushes per tube.  NG tube remains in place to low continuous suction.  Medications reviewed and include: SSI q 4 hours, IV reglan 10 mg q 6 hours, protonix IVF: NS @ 25 ml/hr  Labs reviewed: sodium 133 CBG's: 89-106 x 24 hours  UOP: 750 ml x 24 hours NGT: 400 ml x 24 hours RLQ JP drain: 505 ml x 24 hours CT: 90 ml x 24 hours I/O's: +495 ml since admit  Diet Order:   Diet Order             Diet NPO time specified  Diet effective now                   EDUCATION NEEDS:   No education needs have been identified at this time  Skin:  Skin Assessment: Skin Integrity Issues: Incisions: abdomen, R chest  Last BM:  no documented BM  Height:   Ht Readings from Last 1 Encounters:  09/01/20 5' 8"  (1.727 m)    Weight:   Wt Readings from Last 1 Encounters:  09/01/20 52.2 kg    BMI:  Body mass index is 17.49 kg/m.  Estimated Nutritional Needs:   Kcal:  1900-2100  Protein:  85-100 grams  Fluid:  >/= 2.0 L    Gustavus Bryant, MS, RD, LDN Inpatient Clinical Dietitian Please see AMiON for contact information.

## 2020-09-06 NOTE — Progress Notes (Signed)
Three red liquid medications were given via Gtube at 1500, now drainage on a white towel placed around and below the Gtube site is an orange red with spots of green bile with curdled tube feed in it. Patient has been sleeping with decent pain control so a portion of the medication was believed to be absorbed

## 2020-09-06 NOTE — Progress Notes (Signed)
      [address]       [name] [address]             [phone]       Lots of bilious drainage around the J-tube with curdling. TF stopped at this time. IR consulted for J-tube exchange, orders placed. MD on call paged.  Jari Favre, PA-C

## 2020-09-07 ENCOUNTER — Inpatient Hospital Stay (HOSPITAL_COMMUNITY): Payer: Medicaid Other | Admitting: Anesthesiology

## 2020-09-07 ENCOUNTER — Inpatient Hospital Stay (HOSPITAL_COMMUNITY): Payer: Medicaid Other

## 2020-09-07 ENCOUNTER — Encounter (HOSPITAL_COMMUNITY)
Admission: RE | Disposition: A | Discharge status: Skilled Nursing Facility | Payer: Self-pay | Source: Home / Self Care | Attending: Thoracic Surgery (Cardiothoracic Vascular Surgery)

## 2020-09-07 ENCOUNTER — Encounter (HOSPITAL_COMMUNITY): Payer: Self-pay | Admitting: Thoracic Surgery (Cardiothoracic Vascular Surgery)

## 2020-09-07 DIAGNOSIS — J9602 Acute respiratory failure with hypercapnia: Secondary | ICD-10-CM | POA: Diagnosis not present

## 2020-09-07 DIAGNOSIS — K9189 Other postprocedural complications and disorders of digestive system: Secondary | ICD-10-CM

## 2020-09-07 DIAGNOSIS — J9601 Acute respiratory failure with hypoxia: Secondary | ICD-10-CM | POA: Diagnosis not present

## 2020-09-07 DIAGNOSIS — J9690 Respiratory failure, unspecified, unspecified whether with hypoxia or hypercapnia: Secondary | ICD-10-CM

## 2020-09-07 DIAGNOSIS — K222 Esophageal obstruction: Principal | ICD-10-CM

## 2020-09-07 DIAGNOSIS — J95812 Postprocedural air leak: Secondary | ICD-10-CM

## 2020-09-07 DIAGNOSIS — Z978 Presence of other specified devices: Secondary | ICD-10-CM

## 2020-09-07 HISTORY — PX: ESOPHAGOGASTRODUODENOSCOPY: SHX5428

## 2020-09-07 HISTORY — PX: INTERCOSTAL NERVE BLOCK: SHX5021

## 2020-09-07 LAB — POCT I-STAT EG7
Acid-base deficit: 5 mmol/L — ABNORMAL HIGH (ref 0.0–2.0)
Bicarbonate: 22.4 mmol/L (ref 20.0–28.0)
Calcium, Ion: 1.2 mmol/L (ref 1.15–1.40)
HCT: 32 % — ABNORMAL LOW (ref 39.0–52.0)
Hemoglobin: 10.9 g/dL — ABNORMAL LOW (ref 13.0–17.0)
O2 Saturation: 100 %
Potassium: 4.1 mmol/L (ref 3.5–5.1)
Sodium: 136 mmol/L (ref 135–145)
TCO2: 24 mmol/L (ref 22–32)
pCO2, Ven: 52.3 mmHg (ref 44.0–60.0)
pH, Ven: 7.239 — ABNORMAL LOW (ref 7.250–7.430)
pO2, Ven: 205 mmHg — ABNORMAL HIGH (ref 32.0–45.0)

## 2020-09-07 LAB — POCT I-STAT 7, (LYTES, BLD GAS, ICA,H+H)
Acid-base deficit: 4 mmol/L — ABNORMAL HIGH (ref 0.0–2.0)
Acid-base deficit: 4 mmol/L — ABNORMAL HIGH (ref 0.0–2.0)
Bicarbonate: 24.5 mmol/L (ref 20.0–28.0)
Bicarbonate: 20.3 mmol/L (ref 20.0–28.0)
Calcium, Ion: 1.27 mmol/L (ref 1.15–1.40)
Calcium, Ion: 1.16 mmol/L (ref 1.15–1.40)
HCT: 35 % — ABNORMAL LOW (ref 39.0–52.0)
HCT: 28 % — ABNORMAL LOW (ref 39.0–52.0)
Hemoglobin: 11.9 g/dL — ABNORMAL LOW (ref 13.0–17.0)
Hemoglobin: 9.5 g/dL — ABNORMAL LOW (ref 13.0–17.0)
O2 Saturation: 90 %
O2 Saturation: 94 %
Patient temperature: 36.7
Patient temperature: 97.8
Potassium: 4.2 mmol/L (ref 3.5–5.1)
Potassium: 4 mmol/L (ref 3.5–5.1)
Sodium: 136 mmol/L (ref 135–145)
Sodium: 138 mmol/L (ref 135–145)
TCO2: 26 mmol/L (ref 22–32)
TCO2: 21 mmol/L — ABNORMAL LOW (ref 22–32)
pCO2 arterial: 57.6 mmHg — ABNORMAL HIGH (ref 32.0–48.0)
pCO2 arterial: 32.9 mmHg (ref 32.0–48.0)
pH, Arterial: 7.234 — ABNORMAL LOW (ref 7.350–7.450)
pH, Arterial: 7.395 (ref 7.350–7.450)
pO2, Arterial: 69 mmHg — ABNORMAL LOW (ref 83.0–108.0)
pO2, Arterial: 69 mmHg — ABNORMAL LOW (ref 83.0–108.0)

## 2020-09-07 LAB — CBC
HCT: 34.1 % — ABNORMAL LOW (ref 39.0–52.0)
Hemoglobin: 11.2 g/dL — ABNORMAL LOW (ref 13.0–17.0)
MCH: 30.7 pg (ref 26.0–34.0)
MCHC: 32.8 g/dL (ref 30.0–36.0)
MCV: 93.4 fL (ref 80.0–100.0)
Platelets: 297 10*3/uL (ref 150–400)
RBC: 3.65 MIL/uL — ABNORMAL LOW (ref 4.22–5.81)
RDW: 14.6 % (ref 11.5–15.5)
WBC: 16.8 10*3/uL — ABNORMAL HIGH (ref 4.0–10.5)
nRBC: 0 % (ref 0.0–0.2)

## 2020-09-07 LAB — GLUCOSE, CAPILLARY
Glucose-Capillary: 101 mg/dL — ABNORMAL HIGH (ref 70–99)
Glucose-Capillary: 105 mg/dL — ABNORMAL HIGH (ref 70–99)
Glucose-Capillary: 112 mg/dL — ABNORMAL HIGH (ref 70–99)
Glucose-Capillary: 125 mg/dL — ABNORMAL HIGH (ref 70–99)
Glucose-Capillary: 71 mg/dL (ref 70–99)
Glucose-Capillary: 75 mg/dL (ref 70–99)
Glucose-Capillary: 79 mg/dL (ref 70–99)
Glucose-Capillary: 91 mg/dL (ref 70–99)
Glucose-Capillary: 92 mg/dL (ref 70–99)
Glucose-Capillary: 94 mg/dL (ref 70–99)

## 2020-09-07 LAB — BASIC METABOLIC PANEL
Anion gap: 7 (ref 5–15)
BUN: 26 mg/dL — ABNORMAL HIGH (ref 6–20)
CO2: 23 mmol/L (ref 22–32)
Calcium: 8.2 mg/dL — ABNORMAL LOW (ref 8.9–10.3)
Chloride: 102 mmol/L (ref 98–111)
Creatinine, Ser: 1.03 mg/dL (ref 0.61–1.24)
GFR, Estimated: >60 mL/min (ref >60)
Glucose, Bld: 120 mg/dL — ABNORMAL HIGH (ref 70–99)
Potassium: 3.8 mmol/L (ref 3.5–5.1)
Sodium: 132 mmol/L — ABNORMAL LOW (ref 135–145)

## 2020-09-07 SURGERY — WEDGE RESECTION, LUNG, ROBOT-ASSISTED, THORACOSCOPIC
Anesthesia: General | Site: Chest | Laterality: Right

## 2020-09-07 MED ORDER — DIPHENHYDRAMINE HCL 50 MG/ML IJ SOLN: 12.5000 mg | Freq: Four times a day (QID) | INTRAMUSCULAR | Status: DC | PRN

## 2020-09-07 MED ORDER — LACTATED RINGERS IV SOLN: INTRAVENOUS | Status: DC

## 2020-09-07 MED ORDER — DOCUSATE SODIUM 50 MG/5ML PO LIQD: 100.0000 mg | Freq: Two times a day (BID) | ORAL | Status: DC

## 2020-09-07 MED ORDER — NOREPINEPHRINE 4 MG/250ML-% IV SOLN
0.0000 ug/min | INTRAVENOUS | Status: DC
Start: 2020-09-07 — End: 2020-09-17
  Administered 2020-09-07: 20 ug/min via INTRAVENOUS
  Administered 2020-09-08: 17 ug/min via INTRAVENOUS
  Administered 2020-09-08: 12 ug/min via INTRAVENOUS
  Administered 2020-09-08: 20 ug/min via INTRAVENOUS
  Administered 2020-09-08: 12 ug/min via INTRAVENOUS
  Administered 2020-09-09: 9 ug/min via INTRAVENOUS
  Administered 2020-09-09: 11 ug/min via INTRAVENOUS
  Administered 2020-09-09 (×2): 10 ug/min via INTRAVENOUS
  Administered 2020-09-10: 8 ug/min via INTRAVENOUS
  Administered 2020-09-10: 9 ug/min via INTRAVENOUS
  Administered 2020-09-10: 12 ug/min via INTRAVENOUS
  Administered 2020-09-11: 9 ug/min via INTRAVENOUS
  Administered 2020-09-11: 8 ug/min via INTRAVENOUS
  Administered 2020-09-11: 10 ug/min via INTRAVENOUS
  Administered 2020-09-12: 6 ug/min via INTRAVENOUS
  Administered 2020-09-12: 8 ug/min via INTRAVENOUS
  Administered 2020-09-13: 10 ug/min via INTRAVENOUS
  Administered 2020-09-13: 7 ug/min via INTRAVENOUS
  Administered 2020-09-14: 8 ug/min via INTRAVENOUS
  Administered 2020-09-14 (×2): 12 ug/min via INTRAVENOUS
  Administered 2020-09-14: 14 ug/min via INTRAVENOUS
  Administered 2020-09-15: 6 ug/min via INTRAVENOUS
  Administered 2020-09-15: 12 ug/min via INTRAVENOUS
  Filled 2020-09-07 (×28): qty 250

## 2020-09-07 MED ORDER — FENTANYL CITRATE (PF) 100 MCG/2ML IJ SOLN
50.0000 ug | INTRAMUSCULAR | Status: DC | PRN
  Administered 2020-09-07 – 2020-09-08 (×12): 100 ug via INTRAVENOUS
  Administered 2020-09-09 (×2): 150 ug via INTRAVENOUS
  Administered 2020-09-09 (×5): 100 ug via INTRAVENOUS
  Filled 2020-09-07 (×3): qty 2
  Filled 2020-09-07: qty 4
  Filled 2020-09-07 (×15): qty 2
  Filled 2020-09-07: qty 4

## 2020-09-07 MED ORDER — ALBUMIN HUMAN 5 % IV SOLN: INTRAVENOUS | Status: DC | PRN

## 2020-09-07 MED ORDER — FLUCONAZOLE IN SODIUM CHLORIDE 400-0.9 MG/200ML-% IV SOLN
400.0000 mg | Freq: Once | INTRAVENOUS | Status: AC
  Administered 2020-09-07: 400 mg via INTRAVENOUS
  Filled 2020-09-07: qty 200

## 2020-09-07 MED ORDER — SUCCINYLCHOLINE CHLORIDE 200 MG/10ML IV SOSY
PREFILLED_SYRINGE | INTRAVENOUS | Status: AC
  Filled 2020-09-07: qty 10

## 2020-09-07 MED ORDER — PROPOFOL 10 MG/ML IV BOLUS
INTRAVENOUS | Status: DC | PRN
  Administered 2020-09-07: 40 mg via INTRAVENOUS
  Administered 2020-09-07: 100 mg via INTRAVENOUS

## 2020-09-07 MED ORDER — 0.9 % SODIUM CHLORIDE (POUR BTL) OPTIME
TOPICAL | Status: DC | PRN
  Administered 2020-09-07: 2000 mL

## 2020-09-07 MED ORDER — SUCCINYLCHOLINE CHLORIDE 200 MG/10ML IV SOSY
PREFILLED_SYRINGE | INTRAVENOUS | Status: DC | PRN
  Administered 2020-09-07: 60 mg via INTRAVENOUS
  Administered 2020-09-07: 100 mg via INTRAVENOUS

## 2020-09-07 MED ORDER — SODIUM CHLORIDE 0.9 % IV SOLN: INTRAVENOUS | Status: DC | PRN

## 2020-09-07 MED ORDER — ORAL CARE MOUTH RINSE
15.0000 mL | OROMUCOSAL | Status: DC
  Administered 2020-09-07 – 2020-10-01 (×203): 15 mL via OROMUCOSAL

## 2020-09-07 MED ORDER — MIDAZOLAM HCL 2 MG/2ML IJ SOLN
INTRAMUSCULAR | Status: AC
  Filled 2020-09-07: qty 2

## 2020-09-07 MED ORDER — LIDOCAINE 2% (20 MG/ML) 5 ML SYRINGE
INTRAMUSCULAR | Status: DC | PRN
Start: 2020-09-07 — End: 2020-09-07
  Administered 2020-09-07: 80 mg via INTRAVENOUS

## 2020-09-07 MED ORDER — EPHEDRINE 5 MG/ML INJ
INTRAVENOUS | Status: AC
  Filled 2020-09-07: qty 5

## 2020-09-07 MED ORDER — DEXMEDETOMIDINE HCL IN NACL 200 MCG/50ML IV SOLN
INTRAVENOUS | Status: AC
  Filled 2020-09-07: qty 50

## 2020-09-07 MED ORDER — FENTANYL CITRATE (PF) 250 MCG/5ML IJ SOLN
INTRAMUSCULAR | Status: DC | PRN
  Administered 2020-09-07 (×5): 50 ug via INTRAVENOUS

## 2020-09-07 MED ORDER — DEXAMETHASONE SODIUM PHOSPHATE 10 MG/ML IJ SOLN
INTRAMUSCULAR | Status: AC
  Filled 2020-09-07: qty 1

## 2020-09-07 MED ORDER — SODIUM CHLORIDE FLUSH 0.9 % IV SOLN
INTRAVENOUS | Status: DC | PRN
  Administered 2020-09-07: 100 mL

## 2020-09-07 MED ORDER — BUPIVACAINE HCL (PF) 0.5 % IJ SOLN
INTRAMUSCULAR | Status: AC
  Filled 2020-09-07: qty 30

## 2020-09-07 MED ORDER — HYDROMORPHONE HCL 1 MG/ML IJ SOLN
INTRAMUSCULAR | Status: DC | PRN
  Administered 2020-09-07: 1 mg via INTRAVENOUS

## 2020-09-07 MED ORDER — LIDOCAINE 2% (20 MG/ML) 5 ML SYRINGE
INTRAMUSCULAR | Status: AC
  Filled 2020-09-07: qty 10

## 2020-09-07 MED ORDER — FENTANYL CITRATE (PF) 250 MCG/5ML IJ SOLN
INTRAMUSCULAR | Status: AC
  Filled 2020-09-07: qty 5

## 2020-09-07 MED ORDER — ONDANSETRON HCL 4 MG/2ML IJ SOLN
INTRAMUSCULAR | Status: AC
  Filled 2020-09-07: qty 2

## 2020-09-07 MED ORDER — MORPHINE SULFATE 1 MG/ML IV SOLN PCA: INTRAVENOUS | Status: DC

## 2020-09-07 MED ORDER — ONDANSETRON HCL 4 MG/2ML IJ SOLN
INTRAMUSCULAR | Status: DC | PRN
  Administered 2020-09-07: 4 mg via INTRAVENOUS

## 2020-09-07 MED ORDER — PHENYLEPHRINE 40 MCG/ML (10ML) SYRINGE FOR IV PUSH (FOR BLOOD PRESSURE SUPPORT)
PREFILLED_SYRINGE | INTRAVENOUS | Status: AC
  Filled 2020-09-07: qty 10

## 2020-09-07 MED ORDER — ONDANSETRON HCL 4 MG/2ML IJ SOLN: 4.0000 mg | Freq: Four times a day (QID) | INTRAMUSCULAR | Status: DC | PRN

## 2020-09-07 MED ORDER — MIDAZOLAM HCL 5 MG/5ML IJ SOLN
INTRAMUSCULAR | Status: DC | PRN
  Administered 2020-09-07: 2 mg via INTRAVENOUS
  Administered 2020-09-07: 1 mg via INTRAVENOUS

## 2020-09-07 MED ORDER — PIPERACILLIN-TAZOBACTAM 3.375 G IVPB
3.3750 g | Freq: Three times a day (TID) | INTRAVENOUS | Status: DC
  Administered 2020-09-08 – 2020-09-14 (×19): 3.375 g via INTRAVENOUS
  Filled 2020-09-07 (×19): qty 50

## 2020-09-07 MED ORDER — NOREPINEPHRINE 4 MG/250ML-% IV SOLN
INTRAVENOUS | Status: AC
  Administered 2020-09-07: 2 ug/min via INTRAVENOUS
  Filled 2020-09-07: qty 250

## 2020-09-07 MED ORDER — PROPOFOL 10 MG/ML IV BOLUS
INTRAVENOUS | Status: AC
  Filled 2020-09-07: qty 20

## 2020-09-07 MED ORDER — VANCOMYCIN HCL 1250 MG/250ML IV SOLN
1250.0000 mg | Freq: Once | INTRAVENOUS | Status: AC
  Administered 2020-09-07: 1250 mg via INTRAVENOUS
  Filled 2020-09-07: qty 250

## 2020-09-07 MED ORDER — PHENYLEPHRINE 40 MCG/ML (10ML) SYRINGE FOR IV PUSH (FOR BLOOD PRESSURE SUPPORT)
PREFILLED_SYRINGE | INTRAVENOUS | Status: DC | PRN
  Administered 2020-09-07: 120 ug via INTRAVENOUS

## 2020-09-07 MED ORDER — SUGAMMADEX SODIUM 200 MG/2ML IV SOLN
INTRAVENOUS | Status: DC | PRN
  Administered 2020-09-07: 200 mg via INTRAVENOUS

## 2020-09-07 MED ORDER — CHLORHEXIDINE GLUCONATE 0.12% ORAL RINSE (MEDLINE KIT)
15.0000 mL | Freq: Two times a day (BID) | OROMUCOSAL | Status: DC
  Administered 2020-09-07 – 2020-10-23 (×61): 15 mL via OROMUCOSAL

## 2020-09-07 MED ORDER — ROCURONIUM BROMIDE 10 MG/ML (PF) SYRINGE
PREFILLED_SYRINGE | INTRAVENOUS | Status: AC
  Filled 2020-09-07: qty 30

## 2020-09-07 MED ORDER — FENTANYL CITRATE (PF) 100 MCG/2ML IJ SOLN
50.0000 ug | INTRAMUSCULAR | Status: DC | PRN
  Administered 2020-09-08 (×2): 50 ug via INTRAVENOUS
  Filled 2020-09-07 (×3): qty 2

## 2020-09-07 MED ORDER — PHENYLEPHRINE HCL-NACL 20-0.9 MG/250ML-% IV SOLN
INTRAVENOUS | Status: DC | PRN
Start: 2020-09-07 — End: 2020-09-07
  Administered 2020-09-07: 40 ug/min via INTRAVENOUS

## 2020-09-07 MED ORDER — ROCURONIUM BROMIDE 100 MG/10ML IV SOLN
INTRAVENOUS | Status: DC | PRN
  Administered 2020-09-07: 20 mg via INTRAVENOUS
  Administered 2020-09-07: 60 mg via INTRAVENOUS

## 2020-09-07 MED ORDER — DEXMEDETOMIDINE HCL IN NACL 400 MCG/100ML IV SOLN
0.0000 ug/kg/h | INTRAVENOUS | Status: DC
  Administered 2020-09-07: 0.7 ug/kg/h via INTRAVENOUS
  Filled 2020-09-07: qty 100

## 2020-09-07 MED ORDER — CEFAZOLIN SODIUM-DEXTROSE 2-3 GM-%(50ML) IV SOLR
INTRAVENOUS | Status: DC | PRN
  Administered 2020-09-07: 2 g via INTRAVENOUS

## 2020-09-07 MED ORDER — HYDROMORPHONE HCL 1 MG/ML IJ SOLN
INTRAMUSCULAR | Status: AC
  Filled 2020-09-07: qty 1

## 2020-09-07 MED ORDER — DIPHENHYDRAMINE HCL 12.5 MG/5ML PO ELIX: 12.5000 mg | ORAL_SOLUTION | Freq: Four times a day (QID) | ORAL | Status: DC | PRN

## 2020-09-07 MED ORDER — POLYETHYLENE GLYCOL 3350 17 G PO PACK: 17.0000 g | PACK | Freq: Every day | ORAL | Status: DC

## 2020-09-07 MED ORDER — BUPIVACAINE LIPOSOME 1.3 % IJ SUSP
INTRAMUSCULAR | Status: AC
  Filled 2020-09-07: qty 20

## 2020-09-07 MED ORDER — PIPERACILLIN-TAZOBACTAM 3.375 G IVPB 30 MIN
3.3750 g | Freq: Once | INTRAVENOUS | Status: AC
  Administered 2020-09-07: 3.375 g via INTRAVENOUS
  Filled 2020-09-07: qty 50

## 2020-09-07 MED ORDER — FLUCONAZOLE IN SODIUM CHLORIDE 400-0.9 MG/200ML-% IV SOLN
400.0000 mg | INTRAVENOUS | Status: AC
Start: 2020-09-08 — End: 2020-09-17
  Administered 2020-09-08 – 2020-09-16 (×9): 400 mg via INTRAVENOUS
  Filled 2020-09-07 (×11): qty 200

## 2020-09-07 MED ORDER — SODIUM CHLORIDE 0.9% FLUSH: 9.0000 mL | INTRAVENOUS | Status: DC | PRN

## 2020-09-07 MED ORDER — NALOXONE HCL 0.4 MG/ML IJ SOLN: 0.4000 mg | INTRAMUSCULAR | Status: DC | PRN

## 2020-09-07 MED ORDER — LEVALBUTEROL HCL 0.63 MG/3ML IN NEBU
0.6300 mg | INHALATION_SOLUTION | Freq: Four times a day (QID) | RESPIRATORY_TRACT | Status: DC | PRN
  Administered 2020-09-07: 0.63 mg via RESPIRATORY_TRACT
  Filled 2020-09-07: qty 3

## 2020-09-07 MED ORDER — VANCOMYCIN HCL 1250 MG/250ML IV SOLN
1250.0000 mg | INTRAVENOUS | Status: DC
  Administered 2020-09-08 – 2020-09-09 (×2): 1250 mg via INTRAVENOUS
  Filled 2020-09-07 (×3): qty 250

## 2020-09-07 MED ORDER — SODIUM CHLORIDE 0.9 % IV BOLUS
1000.0000 mL | Freq: Once | INTRAVENOUS | Status: AC
  Administered 2020-09-07: 1000 mL via INTRAVENOUS

## 2020-09-07 MED ORDER — HEMOSTATIC AGENTS (NO CHARGE) OPTIME
TOPICAL | Status: DC | PRN
  Administered 2020-09-07: 1 via TOPICAL

## 2020-09-07 MED ORDER — SODIUM CHLORIDE 0.9 % IR SOLN
Status: DC | PRN
  Administered 2020-09-07: 1000 mL

## 2020-09-07 SURGICAL SUPPLY — 109 items
ADH SKN CLS APL DERMABOND .7 (GAUZE/BANDAGES/DRESSINGS) ×2
APL PRP STRL LF DISP 70% ISPRP (MISCELLANEOUS) ×4
BLADE CLIPPER SURG (BLADE) ×2 IMPLANT
BNDG COHESIVE 6X5 TAN STRL LF (GAUZE/BANDAGES/DRESSINGS) ×2 IMPLANT
BUTTON OLYMPUS DEFENDO 5 PIECE (MISCELLANEOUS) ×2 IMPLANT
CANISTER SUCT 3000ML PPV (MISCELLANEOUS) ×8 IMPLANT
CANNULA REDUC XI 12-8 STAPL (CANNULA) ×6
CANNULA REDUC XI 12-8MM STAPL (CANNULA) ×2
CANNULA REDUCER 12-8 DVNC XI (CANNULA) ×4 IMPLANT
CATH THORACIC 28FR (CATHETERS) ×2 IMPLANT
CATH TROCAR 20FR (CATHETERS) IMPLANT
CHLORAPREP W/TINT 26 (MISCELLANEOUS) ×6 IMPLANT
CLIP VESOCCLUDE MED 6/CT (CLIP) IMPLANT
CNTNR URN SCR LID CUP LEK RST (MISCELLANEOUS) ×4 IMPLANT
CONN ST 1/4X3/8  BEN (MISCELLANEOUS)
CONN ST 1/4X3/8 BEN (MISCELLANEOUS) IMPLANT
CONT SPEC 4OZ STRL OR WHT (MISCELLANEOUS) ×16
DECANTER SPIKE VIAL GLASS SM (MISCELLANEOUS) ×4 IMPLANT
DEFOGGER SCOPE WARMER CLEARIFY (MISCELLANEOUS) ×4 IMPLANT
DERMABOND ADVANCED (GAUZE/BANDAGES/DRESSINGS) ×2
DERMABOND ADVANCED .7 DNX12 (GAUZE/BANDAGES/DRESSINGS) ×2 IMPLANT
DRAIN CHANNEL 19F RND (DRAIN) ×4 IMPLANT
DRAIN CHANNEL 28F RND 3/8 FF (WOUND CARE) IMPLANT
DRAIN CHANNEL 32F RND 10.7 FF (WOUND CARE) IMPLANT
DRAPE ARM DVNC X/XI (DISPOSABLE) ×8 IMPLANT
DRAPE COLUMN DVNC XI (DISPOSABLE) ×2 IMPLANT
DRAPE CV SPLIT W-CLR ANES SCRN (DRAPES) ×4 IMPLANT
DRAPE DA VINCI XI ARM (DISPOSABLE) ×16
DRAPE DA VINCI XI COLUMN (DISPOSABLE) ×4
DRAPE HALF SHEET 40X57 (DRAPES) ×4 IMPLANT
DRAPE ORTHO SPLIT 77X108 STRL (DRAPES) ×4
DRAPE SURG ORHT 6 SPLT 77X108 (DRAPES) ×2 IMPLANT
ELECT BLADE 6.5 EXT (BLADE) IMPLANT
ELECT REM PT RETURN 9FT ADLT (ELECTROSURGICAL) ×4
ELECTRODE REM PT RTRN 9FT ADLT (ELECTROSURGICAL) ×2 IMPLANT
EVACUATOR SILICONE 100CC (DRAIN) ×2 IMPLANT
GAUZE KITTNER 4X5 RF (MISCELLANEOUS) ×4 IMPLANT
GAUZE SPONGE 4X4 12PLY STRL (GAUZE/BANDAGES/DRESSINGS) ×4 IMPLANT
GOWN STRL REUS W/ TWL LRG LVL3 (GOWN DISPOSABLE) ×4 IMPLANT
GOWN STRL REUS W/ TWL XL LVL3 (GOWN DISPOSABLE) ×6 IMPLANT
GOWN STRL REUS W/TWL 2XL LVL3 (GOWN DISPOSABLE) ×4 IMPLANT
GOWN STRL REUS W/TWL LRG LVL3 (GOWN DISPOSABLE) ×8
GOWN STRL REUS W/TWL XL LVL3 (GOWN DISPOSABLE) ×12
HEMOSTAT SURGICEL 2X14 (HEMOSTASIS) ×8 IMPLANT
IRRIGATOR SUCT 8 DISP DVNC XI (IRRIGATION / IRRIGATOR) IMPLANT
IRRIGATOR SUCTION 8MM XI DISP (IRRIGATION / IRRIGATOR) ×4
KIT BASIN OR (CUSTOM PROCEDURE TRAY) ×4 IMPLANT
KIT SUCTION CATH 14FR (SUCTIONS) IMPLANT
KIT TURNOVER KIT B (KITS) ×4 IMPLANT
LOOP VESSEL SUPERMAXI WHITE (MISCELLANEOUS) IMPLANT
NDL HYPO 25GX1X1/2 BEV (NEEDLE) ×2 IMPLANT
NEEDLE 22X1 1/2 (OR ONLY) (NEEDLE) ×4 IMPLANT
NEEDLE HYPO 25GX1X1/2 BEV (NEEDLE) ×4 IMPLANT
NS IRRIG 1000ML POUR BTL (IV SOLUTION) ×12 IMPLANT
OBTURATOR OPTICAL STANDARD 8MM (TROCAR)
OBTURATOR OPTICAL STND 8 DVNC (TROCAR)
OBTURATOR OPTICALSTD 8 DVNC (TROCAR) IMPLANT
OIL SILICONE PENTAX (PARTS (SERVICE/REPAIRS)) ×2 IMPLANT
PACK CHEST (CUSTOM PROCEDURE TRAY) ×4 IMPLANT
PACK UNIVERSAL I (CUSTOM PROCEDURE TRAY) ×2 IMPLANT
PAD ARMBOARD 7.5X6 YLW CONV (MISCELLANEOUS) ×20 IMPLANT
PORT ACCESS TROCAR AIRSEAL 12 (TROCAR) ×2 IMPLANT
PORT ACCESS TROCAR AIRSEAL 5M (TROCAR) ×2
SEAL CANN UNIV 5-8 DVNC XI (MISCELLANEOUS) ×4 IMPLANT
SEAL XI 5MM-8MM UNIVERSAL (MISCELLANEOUS) ×8
SEALANT SURG COSEAL 4ML (VASCULAR PRODUCTS) IMPLANT
SEALANT SURG COSEAL 8ML (VASCULAR PRODUCTS) IMPLANT
SET TRI-LUMEN FLTR TB AIRSEAL (TUBING) ×4 IMPLANT
SOLUTION ELECTROLUBE (MISCELLANEOUS) IMPLANT
SPONGE DRAIN TRACH 4X4 STRL 2S (GAUZE/BANDAGES/DRESSINGS) ×2 IMPLANT
SPONGE INTESTINAL PEANUT (DISPOSABLE) IMPLANT
STAPLER CANNULA SEAL DVNC XI (STAPLE) ×4 IMPLANT
STAPLER CANNULA SEAL XI (STAPLE) ×8
STOPCOCK 4 WAY LG BORE MALE ST (IV SETS) ×4 IMPLANT
SUT ETHILON 2 0 FS 18 (SUTURE) ×6 IMPLANT
SUT MON AB 2-0 CT1 36 (SUTURE) IMPLANT
SUT PDS AB 1 CTX 36 (SUTURE) IMPLANT
SUT PROLENE 4 0 RB 1 (SUTURE)
SUT PROLENE 4-0 RB1 .5 CRCL 36 (SUTURE) IMPLANT
SUT SILK  1 MH (SUTURE) ×4
SUT SILK 1 MH (SUTURE) ×2 IMPLANT
SUT SILK 1 TIES 10X30 (SUTURE) IMPLANT
SUT SILK 2 0 SH (SUTURE) IMPLANT
SUT SILK 2 0SH CR/8 30 (SUTURE) IMPLANT
SUT VIC AB 1 CTX 36 (SUTURE)
SUT VIC AB 1 CTX36XBRD ANBCTR (SUTURE) IMPLANT
SUT VIC AB 2-0 CT1 27 (SUTURE) ×8
SUT VIC AB 2-0 CT1 TAPERPNT 27 (SUTURE) ×2 IMPLANT
SUT VIC AB 2-0 SH 27 (SUTURE) ×12
SUT VIC AB 2-0 SH 27XBRD (SUTURE) IMPLANT
SUT VIC AB 3-0 SH 27 (SUTURE) ×12
SUT VIC AB 3-0 SH 27X BRD (SUTURE) ×6 IMPLANT
SUT VICRYL 0 TIES 12 18 (SUTURE) ×4 IMPLANT
SUT VICRYL 0 UR6 27IN ABS (SUTURE) ×8 IMPLANT
SUT VICRYL 2 TP 1 (SUTURE) IMPLANT
SYR 10ML LL (SYRINGE) ×4 IMPLANT
SYR 20ML LL LF (SYRINGE) ×4 IMPLANT
SYR 50ML LL SCALE MARK (SYRINGE) ×4 IMPLANT
SYSTEM SAHARA CHEST DRAIN ATS (WOUND CARE) ×4 IMPLANT
TAPE CLOTH 4X10 WHT NS (GAUZE/BANDAGES/DRESSINGS) ×4 IMPLANT
TAPE CLOTH SURG 4X10 WHT LF (GAUZE/BANDAGES/DRESSINGS) ×2 IMPLANT
TIP APPLICATOR SPRAY EXTEND 16 (VASCULAR PRODUCTS) IMPLANT
TOWEL GREEN STERILE (TOWEL DISPOSABLE) ×4 IMPLANT
TRAY FOLEY MTR SLVR 16FR STAT (SET/KITS/TRAYS/PACK) ×4 IMPLANT
TROCAR BLADELESS 15MM (ENDOMECHANICALS) IMPLANT
TUBING ENDO SMARTCAP (MISCELLANEOUS) ×4 IMPLANT
TUBING EXTENTION W/L.L. (IV SETS) ×4 IMPLANT
UNDERPAD 30X36 HEAVY ABSORB (UNDERPADS AND DIAPERS) ×4 IMPLANT
WATER STERILE IRR 1000ML POUR (IV SOLUTION) ×4 IMPLANT

## 2020-09-07 NOTE — Progress Notes (Signed)
Patient via bed to CT in stable condition

## 2020-09-07 NOTE — Anesthesia Preprocedure Evaluation (Addendum)
Anesthesia Evaluation  Patient identified by MRN, date of birth, ID band Patient awake  General Assessment Comment:S/p esophagectomy with Anastomotic leak  Reviewed: Allergy & Precautions, NPO status   Airway Mallampati: II  TM Distance: >3 FB Neck ROM: Full    Dental  (+) Missing, Poor Dentition, Chipped, Loose, Dental Advisory Given   Pulmonary Current Smoker and Patient abstained from smoking.,     + decreased breath sounds      Cardiovascular hypertension, + CAD, + Past MI and + Peripheral Vascular Disease   Rhythm:Regular Rate:Normal  Gated Study: ? The left ventricular ejection fraction is normal (55-65%). ? Nuclear stress EF: 57%. ? There was no ST segment deviation noted during stress. ? No T wave inversion was noted during stress. ? The study is normal. ? This is a low risk study with no evidence of ischemia.  Echo: 1. Normal LV systolic function; grade 1 diastolic dysfunction.  2. Left ventricular ejection fraction, by estimation, is 60 to 65%. The  left ventricle has normal function. The left ventricle has no regional  wall motion abnormalities. Left ventricular diastolic parameters are  consistent with Grade I diastolic  dysfunction (impaired relaxation).  3. Right ventricular systolic function is normal. The right ventricular  size is normal.  4. The mitral valve is normal in structure. No evidence of mitral valve  regurgitation. No evidence of mitral stenosis.  5. The aortic valve was not well visualized. Aortic valve regurgitation  is not visualized. No aortic stenosis is present.  6. The inferior vena cava is normal in size with greater than 50%  respiratory variability, suggesting right atrial pressure of 3 mmHg.    Neuro/Psych PSYCHIATRIC DISORDERS Anxiety Depression CVA    GI/Hepatic Neg liver ROS, GERD  Medicated,  Endo/Other  negative endocrine ROS  Renal/GU negative Renal ROS      Musculoskeletal negative musculoskeletal ROS (+)   Abdominal Normal abdominal exam  (+)   Peds  Hematology  (+) Blood dyscrasia, anemia ,   Anesthesia Other Findings   Reproductive/Obstetrics                             Anesthesia Physical  Anesthesia Plan  ASA: 3  Anesthesia Plan: General   Post-op Pain Management:    Induction: Intravenous  PONV Risk Score and Plan: 2 and Ondansetron and Midazolam  Airway Management Planned: Double Lumen EBT  Additional Equipment: Arterial line and CVP  Intra-op Plan:   Post-operative Plan: Possible Post-op intubation/ventilation  Informed Consent: I have reviewed the patients History and Physical, chart, labs and discussed the procedure including the risks, benefits and alternatives for the proposed anesthesia with the patient or authorized representative who has indicated his/her understanding and acceptance.     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  Recent echo 04/30/2019 showed EF 60 to 65%, grade 1 DD, normal RV function, normal valves.  Dr. Cliffton Asters ordered preoperative nuclear stress test which was done 07/20/2020 and showed no evidence of ischemia, low risk study.  Preop labs reviewed, mild hyponatremia sodium 131, otherwise unremarkable.  EKG 08/16/2020: Sinus bradycardia.  Rate 55. Minimal voltage criteria for LVH, may be normal variant  CHEST - 2 VIEW 08/30/2020:  COMPARISON: 08/16/2020. 05/23/2020.  CT 08/25/2019.  FINDINGS: Mediastinum and hilar structures normal. Heart size normal. COPD. No acute infiltrate. No pleural effusion or pneumothorax. Stable mild elevation left hemidiaphragm. Degenerative change thoracic  spine. Abnormality.  IMPRESSION: COPD. Stable mild elevation left hemidiaphragm. No acute cardiopulmonary disease.  Nuclear stress 07/20/2020: . The left ventricular ejection fraction is normal (55-65%). . Nuclear stress EF: 57%. . There was no ST segment deviation noted during stress. Marland Kitchen No T wave inversion was noted during stress. . The study is normal. . This is a low risk study with no evidence of ischemia.  TTE 04/30/2019: 1. Normal LV systolic function; grade 1 diastolic dysfunction.  2. Left ventricular ejection fraction, by estimation, is 60 to 65%. The  left ventricle has normal function. The left ventricle has no regional  wall motion abnormalities. Left ventricular diastolic parameters are  consistent with Grade I diastolic  dysfunction (impaired relaxation).  3. Right ventricular systolic function is normal. The right ventricular  size is normal.  4. The mitral valve is normal in structure. No evidence of mitral valve  regurgitation. No evidence of mitral stenosis.  5. The aortic valve was not well visualized. Aortic valve regurgitation  is not visualized. No aortic stenosis is present.  6. The inferior vena cava is normal in size with greater than 50%  respiratory variability, suggesting right atrial pressure of 3 mmHg.  )       Anesthesia Quick Evaluation

## 2020-09-07 NOTE — Progress Notes (Signed)
Hypoglycemic Event  CBG: 35  Treatment: D50 25 mL (12.5 gm)  Symptoms: None  Follow-up CBG: Time: 0015 CBG Result: 91  Possible Reasons for Event: Tube feeding interruption   Comments/MD notified: MD will be notified.    Floy Angert N

## 2020-09-07 NOTE — Progress Notes (Signed)
Pharmacy Antibiotic Note  Ralph [name] is a 55 y.o. male admitted on 09/01/2020 for planned esophagectomy. Post-op with bilious drainage around J-tube and increased NGT output - back to OR 8/3 for endoscopy and re-exploration with findings of esophageal anastomotic leak. Pharmacy has been consulted for Vancomycin + Zosyn + Fluconazole dosing.  Plan: - Vancomycin 1250 mg IV every 24 hours (eAUC ~400, SCr 1.03, Vd 0.72) - Zosyn 3.375g IV x 1 now followed by 3.375g EI every 8 hours - Fluconazole 400 mg IV every 24 hours  Height: 5\' 8"  (172.7 cm) Weight: 61 kg (134 lb 7.7 oz) IBW/kg (Calculated) : 68.4  Temp (24hrs), Avg:98.5 F (36.9 C), Min:97.6 F (36.4 C), Max:98.9 F (37.2 C)  Recent Labs  Lab 09/03/20 0910 09/04/20 0426 09/05/20 0359 09/06/20 0455 09/07/20 0219  WBC 12.5* 11.1* 9.0 12.7* 16.8*  CREATININE 0.61 0.60* 0.58* 0.76 1.03    Estimated Creatinine Clearance: 72.4 mL/min (by C-G formula based on SCr of 1.03 mg/dL).    No Known Allergies  Antimicrobials this admission: Vanc 8/3 >> Zosyn 8/3 >> Fluconazole 8/3 >>  Dose adjustments this admission:   Microbiology results: 8/3 Tissue cx >>  Thank you for allowing pharmacy to be a part of this patient's care.  10/3, PharmD, BCPS Clinical Pharmacist Clinical phone for 09/07/2020: [phone] 09/07/2020 8:37 PM   **Pharmacist phone directory can now be found on [email or web address] ([email or web address]).  Listed under [facility].

## 2020-09-07 NOTE — Plan of Care (Signed)
  Problem: Education: Goal: Knowledge of the prescribed therapeutic regimen will improve Outcome: Progressing   Problem: Respiratory: Goal: Ability to maintain a clear airway will improve Outcome: Progressing   Problem: Education: Goal: Knowledge of General Education information will improve Description: Including pain rating scale, medication(s)/side effects and non-pharmacologic comfort measures Outcome: Progressing   Problem: Clinical Measurements: Goal: Will remain free from infection Outcome: Progressing Goal: Respiratory complications will improve Outcome: Progressing   Problem: Coping: Goal: Level of anxiety will decrease Outcome: Progressing

## 2020-09-07 NOTE — Progress Notes (Addendum)
      [address]       [place] [address]             [phone]      6 Days Post-Op Procedure(s) (LRB): XI ROBOTIC ASSISTED IVOR LEWIS ESOPHAGECTOMY (N/A) ESOPHAGOGASTRODUODENOSCOPY (EGD) (N/A) INTERCOSTAL NERVE BLOCK (Right) Subjective: Pain has been better controlled with the morphine added. He is passing lots of gas but still no BM  Objective: Vital signs in last 24 hours: Temp:  [97.5 F (36.4 C)-98.9 F (37.2 C)] 98.9 F (37.2 C) (08/03 0415) Pulse Rate:  [57-80] 80 (08/03 0415) Cardiac Rhythm: Normal sinus rhythm (08/03 0704) Resp:  [15-20] 20 (08/03 0415) BP: (71-94)/(51-74) 91/74 (08/03 0415) SpO2:  [90 %-92 %] 91 % (08/03 0415) FiO2 (%):  [21 %] 21 % (08/02 0748) Weight:  [61 kg] 61 kg (08/03 0600)     Intake/Output from previous day: 08/02 0701 - 08/03 0700 In: 1702.4 [I.V.:497.4; NG/GT:1205] Out: 2930 [Urine:400; Emesis/NG output:100; Drains:1740; Chest Tube:690] Intake/Output this shift: No intake/output data recorded.  General appearance: alert, cooperative, and no distress Heart: regular rate and rhythm, S1, S2 normal, no murmur, click, rub or gallop Lungs: rhonchi bilaterally Abdomen: soft, non-tender; bowel sounds normal; no masses,  no organomegaly Extremities: extremities normal, atraumatic, no cyanosis or edema Wound: clean and dry  Lab Results: Recent Labs    09/06/20 0455 09/07/20 0219  WBC 12.7* 16.8*  HGB 11.3* 11.2*  HCT 34.8* 34.1*  PLT 281 297   BMET:  Recent Labs    09/06/20 0455 09/07/20 0219  NA 133* 132*  K 3.9 3.8  CL 103 102  CO2 23 23  GLUCOSE 105* 120*  BUN 14 26*  CREATININE 0.76 1.03  CALCIUM 8.2* 8.2*    PT/INR: No results for input(s): LABPROT, INR in the last 72 hours. ABG    Component Value Date/Time   PHART 7.435 09/01/2020 2021   HCO3 25.5 09/01/2020 2021   TCO2 28 09/01/2020 1528   O2SAT 93.4 09/01/2020 2021   CBG (last 3)  Recent Labs    09/07/20 0205 09/07/20 0413  09/07/20 0623  GLUCAP 75 79 92    Assessment/Plan: S/P Procedure(s) (LRB): XI ROBOTIC ASSISTED IVOR LEWIS ESOPHAGECTOMY (N/A) ESOPHAGOGASTRODUODENOSCOPY (EGD) (N/A) INTERCOSTAL NERVE BLOCK (Right)   NSR in the 70s with occasional SB. BP remains low but is stable Pulm- 690cc out of the chest tube.  Renal- creatinine 1.03,  lytes remain normal GI-remains NPO, NG tube output has decreased to 100cc output yesterday, J tube with curdled drainage yesterday, TF stopped and IR consulted for replacement of J-tube Pain control- much improved. Continue morphine Hypoglycemia last night after accidentally insulin dose. Corrected with dextrose, now on 69ml/hr dextrose half normal saline infusion.  Lovenox for DVT prophylaxis Mobility- patient not walking with nursing despite attempts, will get PT consult to help get patient mobilized  Plan: To the OR today for endoscopy and reexploration. Hopeful for J-tube exchange before the OR. Site looks much better today however has not been in use. Started on fluids for now. Pain is well controlled.    LOS: 6 days    Sharlene Dory 09/07/2020   Continues to have high pleural tube output J tube nonfunctional, IR consult ordered for exchange OR today for anastamosis assessment, and possible revision  Lakeria Starkman Keane Scrape

## 2020-09-07 NOTE — Progress Notes (Signed)
Patient ID: Ralph [name], male   DOB: [birth year], 55 y.o.   MRN: [id number] TCTS Evening Rounds;  Hemodynamically stable on vent.  Taken back to OR today for decort and repair of esophageal anastomotic leak.  Chest tube output low.   Hematoma posterior to incision but not sure how long it has been there. Will observe for now.

## 2020-09-07 NOTE — Anesthesia Procedure Notes (Signed)
Procedure Name: Intubation Date/Time: 09/07/2020 2:07 PM Performed by: Donnelly Angelica, RN Pre-anesthesia Checklist: Patient identified, Emergency Drugs available, Suction available and Patient being monitored Patient Re-evaluated:Patient Re-evaluated prior to induction Oxygen Delivery Method: Circle System Utilized Preoxygenation: Pre-oxygenation with 100% oxygen Induction Type: IV induction and Rapid sequence Laryngoscope Size: Mac and 4 Tube type: Oral Endobronchial tube: Double lumen EBT, Right and EBT position confirmed by fiberoptic bronchoscope and 37 Fr Number of attempts: 1 Airway Equipment and Method: Stylet Placement Confirmation: ETT inserted through vocal cords under direct vision, positive ETCO2 and breath sounds checked- equal and bilateral Secured at: 29 cm Tube secured with: Tape Dental Injury: Teeth and Oropharynx as per pre-operative assessment

## 2020-09-07 NOTE — Consult Note (Signed)
Critical care attending attestation note:  Patient seen and examined and relevant ancillary tests reviewed.  I agree with the assessment and plan of care as outlined by Lidia Collum, PA-C. The following reflects my independent critical care time.   Synopsis of assessment and plan: 55 year old man with coronary artery disease, prior stroke underwent esophagectomy due to esophageal rupture, today again he went for reexploration.  Postop remained intubated, PCCM was consulted for evaluation and help with management   Physical exam: General: Critically ill-appearing male, orally intubated HEENT: /AT, eyes anicteric.  ETT and OGT in place Neuro: Sedated, not following commands.  Eyes are closed.  Pupils 3 mm bilateral reactive to light Chest: Coarse breath sounds, positive for rhonchi all over.  Left-sided chest tube in place, with 2 JP drains Heart: Regular rate and rhythm, no murmurs or gallops Abdomen: Soft, nontender, nondistended, bowel sounds present Skin: No rash  Labs and images were reviewed  Assessment plan: Acute hypoxic/hypercapnic respiratory failure Esophageal rupture s/p esophagectomy Acute kidney injury, prerenal due to dehydration Coronary artery disease  Continue lung protective ventilation Ventilator mode was switched to PRVC VAP bundle Elevate head end of the bed Repeat ABGs Monitor drain and left-sided chest tube output Continue IV fluid Monitor intake and output Monitor serum creatinine and electrolytes  CRITICAL CARE Performed by: Cheri Fowler   Total independent critical care time: 36 minutes  Critical care time was exclusive of separately billable procedures and treating other patients.  Critical care was necessary to treat or prevent imminent or life-threatening deterioration.   Critical care was time spent personally by me on the following activities: development of treatment plan with patient and/or surrogate as well as nursing, discussions with  consultants, evaluation of patient's response to treatment, examination of patient, obtaining history from patient or surrogate, ordering and performing treatments and interventions, ordering and review of laboratory studies, ordering and review of radiographic studies, pulse oximetry, re-evaluation of patient's condition and participation in multidisciplinary rounds.  Cheri Fowler MD [facility] See Amion for pager If no response to pager, please call [phone] until 7pm After 7pm, Please call E-link [phone]  09/07/2020, 6:33 PM

## 2020-09-07 NOTE — Op Note (Signed)
[address]       [name] [address]             [phone]        09/07/2020  Patient:  Ralph [name] Pre-Op Dx: Status post robotic assisted Ivor Lewis esophagectomy   History of esophageal stricture   Concern for anastomotic leak Post-op Dx: Same Procedure: -Esophagoscopy - Robotic assisted right video thoracoscopy -Decortication -Reinforcement of esophageal anastomosis  Surgeon and Role:      * Penina Reisner, Eliezer Lofts, MD - Primary    Webb Laws, PA-C- assisting  Anesthesia  general EBL: Minimal Blood Administration: None Specimen: Pleural  Drains: 72 F argyle and 19 French Blake chest tube in right chest.  19 [place] Blake in the peritoneum. Counts: correct   Indications: Is a 55 year old gentleman that was chronically malnourished due to a benign esophageal stricture.  He underwent robotic assisted Ivor Lewis esophagectomy on 728.  Postoperatively he did well.  He did have a swallow study that did not show any significant extravasation of contrast however 1 day following this he started developing bilious output from his pleural drain.  Due to concern for anastomotic leak he was taken to the operating theater for exploration and potential revision.  Findings: On endoscopy there was some sloughing noted at the area of the anastomosis.  The esophagus and the stomach both appeared healthy however.  There was 1 area on the lateral wall concerning for leak.  We did perform an air leak test and there were minimal bubbles.  Several rows of Vicryl suture were used to reinforce this layer.  Additional omentum was used to buttress this area as well.  Repeat air leak test did not show any bubbles.  Operative Technique: After the risks, benefits and alternatives were thoroughly discussed, the patient was brought to the operative theatre.  Anesthesia was induced, and the the gastroscope was advanced through the oropharynx.  We passed it into the cervical esophagus  and visualized the gastroesophageal anastomosis.  There was some mild sloughing noted at the anastomosis but the mucosa of the stomach and the esophagus both appeared healthy.  There was a small area concerning for leak on the lateral wall.  The scope was then pulled back proximal to the anastomosis and the patient was placed in a left lateral decubitus position.  He was then prepped and draped in normal sterile fashion.    We began by placing our 3 robotic ports to triangulate the azygos vein.  We used previous incisions to do so.  The camera was introduced and some adhesions had to be taken down to mobilize the lung off of the chest wall and the esophagus.  We then docked the robot, and began to explore the anastomosis.  We filled the pleural space with saline and performed an air leak test and there was small bubbles coming from the anastomosis.  These bubbles are very intermittent.  The fluid was then removed and I reinforced the anastomosis with big bites of the esophagus imbricated to the stomach to cover this area.  It was then covered with some residual omentum that was close to the anastomosis.  Another air leak test was performed there is no evidence of leak.  Chest tubes and drains were placed.  The previous incisions were closed with absorbable suture.     The patient tolerated the procedure without any immediate complications, and was transferred to the ICU in guarded condition.  Valentino Saxon  Keane Scrape

## 2020-09-07 NOTE — Transfer of Care (Signed)
Immediate Anesthesia Transfer of Care Note  Patient: Ralph [name]  Procedure(s) Performed: XI ROBOTIC ASSISTED THORASCOPY-DECORTICATION & REVISION OF ESOPHAGECTOMY (Right: Chest) ESOPHAGOGASTRODUODENOSCOPY (EGD) INTERCOSTAL NERVE BLOCK (Right: Chest)  Patient Location: ICU  Anesthesia Type:General  Level of Consciousness: Patient remains intubated per anesthesia plan  Airway & Oxygen Therapy: Patient remains intubated per anesthesia plan and Patient placed on Ventilator (see vital sign flow sheet for setting)  Post-op Assessment: Report given to RN and Post -op Vital signs reviewed and stable  Post vital signs: Reviewed and stable  Last Vitals:  Vitals Value Taken Time  BP 103/78 09/07/20 1730  Temp    Pulse 108 09/07/20 1737  Resp 17 09/07/20 1737  SpO2 40 % 09/07/20 1737  Vitals shown include unvalidated device data.  Last Pain:  Vitals:   09/07/20 1322  TempSrc:   PainSc: 7       Patients Stated Pain Goal: 3 (09/07/20 7867)  Complications: No notable events documented.

## 2020-09-07 NOTE — Progress Notes (Signed)
Physical Therapy Treatment Patient Details Name: Ralph [name] MRN: [id number] DOB: [birth year] Today's Date: 09/07/2020    History of Present Illness pt is a 55 y/o male admitted 7/28 for an Ivor-Lewis esophagectomy due to benign lower esophageal stricture and protein malnutrition.   PMHx: anxiety, CAD, MI, Stroke, multiple ballon dilations to esophageal stricture.    PT Comments    Pt received in bed, agreeable to participation in therapy. He required mod assist supine to sit, mod assist sit to stand, min assist ambulation 50'+10' with RW, and min assist sit to supine. +2 required for safety/equipment/chair follow with all mobility. Pt on 6L O2 at rest and during mobility with VSS. Pt with good participation today despite high level of pain. Pt returned to bed at end of session.    Follow Up Recommendations  Home health PT;Supervision/Assistance - 24 hour;Other (comment)     Equipment Recommendations  Rolling walker with 5" wheels;Other (comment) (TBD)    Recommendations for Other Services       Precautions / Restrictions Precautions Precautions: Fall;Other (comment) Precaution Comments: PEG tube, NG tube, chest tube    Mobility  Bed Mobility Overal bed mobility: Needs Assistance Bed Mobility: Sit to Supine;Supine to Sit     Supine to sit: Mod assist;HOB elevated;+2 for safety/equipment Sit to supine: +2 for safety/equipment;Min assist;HOB elevated   General bed mobility comments: increased time, cues for sequencing    Transfers Overall transfer level: Needs assistance Equipment used: Rolling walker (2 wheeled) Transfers: Sit to/from Stand Sit to Stand: +2 safety/equipment;Mod assist         General transfer comment: cues for hand placement and sequencing  Ambulation/Gait Ambulation/Gait assistance: +2 safety/equipment;Min assist Gait Distance (Feet): 50 Feet (+ 10) Assistive device: Rolling walker (2 wheeled) Gait Pattern/deviations: Step-through  pattern;Decreased stride length Gait velocity: decreased Gait velocity interpretation: <1.31 ft/sec, indicative of household ambulator General Gait Details: Ambulated 21' then 10' with seated rest break in between. +2 for IV/equipment and chair follow. Mobilized on 6L with SpO2 in 90s.   Stairs             Wheelchair Mobility    Modified Rankin (Stroke Patients Only)       Balance Overall balance assessment: Needs assistance Sitting-balance support: Feet supported;No upper extremity supported Sitting balance-Leahy Scale: Fair     Standing balance support: Bilateral upper extremity supported;During functional activity Standing balance-Leahy Scale: Poor Standing balance comment: pt reliant on external support.                            Cognition Arousal/Alertness: Awake/alert Behavior During Therapy: Anxious Overall Cognitive Status: No family/caregiver present to determine baseline cognitive functioning                                 General Comments: internally distracted by pain. Following commands consistently.      Exercises      General Comments General comments (skin integrity, edema, etc.): Pt on 6L continuous O2 with SpO2 in 90s.      Pertinent Vitals/Pain Pain Assessment: Faces Faces Pain Scale: Hurts whole lot Pain Location: abdomen/flanks/general Pain Descriptors / Indicators: Grimacing;Guarding;Moaning Pain Intervention(s): Limited activity within patient's tolerance;Monitored during session;Repositioned;Patient requesting pain meds-RN notified    Home Living                      Prior Function  PT Goals (current goals can now be found in the care plan section) Acute Rehab PT Goals Patient Stated Goal: home Progress towards PT goals: Progressing toward goals    Frequency    Min 3X/week      PT Plan Current plan remains appropriate    Co-evaluation              AM-PAC PT "6  Clicks" Mobility   Outcome Measure  Help needed turning from your back to your side while in a flat bed without using bedrails?: A Lot Help needed moving from lying on your back to sitting on the side of a flat bed without using bedrails?: A Lot Help needed moving to and from a bed to a chair (including a wheelchair)?: A Lot Help needed standing up from a chair using your arms (e.g., wheelchair or bedside chair)?: A Lot Help needed to walk in hospital room?: A Lot Help needed climbing 3-5 steps with a railing? : A Lot 6 Click Score: 12    End of Session Equipment Utilized During Treatment: Gait belt;Oxygen Activity Tolerance: Patient tolerated treatment well Patient left: in bed;with call bell/phone within reach Nurse Communication: Mobility status PT Visit Diagnosis: Other abnormalities of gait and mobility (R26.89);Muscle weakness (generalized) (M62.81);Pain     Time: 4097-3532 PT Time Calculation (min) (ACUTE ONLY): 25 min  Charges:  $Gait Training: 23-37 mins                     Aida Raider, [place]  Office # [phone] Pager [phone]    Ilda Foil 09/07/2020, 12:44 PM

## 2020-09-07 NOTE — Progress Notes (Signed)
eLink Physician-Brief Progress Note Patient Name: Ralph [name] DOB: Jan 20, 1966 MRN: [id number]   Date of Service  09/07/2020  HPI/Events of Note  Hypotension - CVP = 8. Last LVEF = 60-65%.  eICU Interventions  Plan: Bolus with 0.9 NaCl 1 liter IV over 1 hour now.      Intervention Category Major Interventions: Hypotension - evaluation and management  Kavan Devan Dennard Nip 09/07/2020, 8:13 PM

## 2020-09-07 NOTE — Progress Notes (Signed)
Patient Ralph [name] changed out

## 2020-09-07 NOTE — Consult Note (Signed)
NAME:  Ralph [name], MRN:  [id number], DOB:  1965-06-11, LOS: 6 ADMISSION DATE:  09/01/2020, CONSULTATION DATE:  8/3 REFERRING MD:  Dr. Cliffton Asters, CHIEF COMPLAINT:  esophageal rupture esophageal stricture   History of Present Illness:  Patient is a 55 yo M w/ PMH anxiety, CAD, MI, stroke presents to [facility] on 7/28 for esophagectomy w/ Dr. Cliffton Asters for esophageal stricture.   On 7/28, Dr. Cliffton Asters performed the esophagectomy without complication. Chest tube was placed on right chest. J tube in place. 2 JP drains in place. Patient was extubated post procedure. Patient developed subcutaneous emphysema. Unable to use J tube due to leakage around site.  On 8/3 patient taken back to OR for esophageal rupture s/p esophagectomy by Dr. Cliffton Asters. Unable to extubate post procedure. Patient remains intubated on mechanical ventilation. ABG with respiratory acidosis.   PCCM consulted for vent management.  Pertinent  Medical History   Past Medical History:  Diagnosis Date   Anxiety    Coronary artery disease    Myocardial infarction [facility])    Stroke (HCC)      Significant Hospital Events: Including procedures, antibiotic start and stop dates in addition to other pertinent events   7/28 admitted 8/3: PCCM consulted for vent management  Interim History / Subjective:  Patient intubated. Not currently on any sedation. Sats 100% Chest tube in place w/ 690 cc output.  Objective   Blood pressure 116/85, pulse 87, temperature 98.7 F (37.1 C), temperature source Oral, resp. rate 12, height 5\' 8"  (1.727 m), weight 61 kg, SpO2 99 %.    Vent Mode: SIMV;PSV;PRVC FiO2 (%):  [50 %] 50 % Set Rate:  [12 bmp] 12 bmp Vt Set:  [540 mL] 540 mL PEEP:  [5 cmH20] 5 cmH20 Pressure Support:  [10 cmH20] 10 cmH20 Plateau Pressure:  [16 cmH20] 16 cmH20   Intake/Output Summary (Last 24 hours) at 09/07/2020 1736 Last data filed at 09/07/2020 1715 Gross per 24 hour  Intake 3324.11 ml  Output 2810 ml  Net 514.11 ml    Filed Weights   09/01/20 0635 09/07/20 0600 09/07/20 1308  Weight: 52.2 kg 61 kg 61 kg    Examination: General: critically ill appearing on mech vent HEENT: MM pink/moist; ETT in place Neuro: sedated post procedure CV: s1s2, RRR, no m/r/g PULM:  sub cutaneous emphysema on right chest; coarse rhonchi Bs bilaterally; on mech vent PRVC GI: soft, bsx4 active  Extremities: warm/dry, no edema  Skin: no rashes or lesions    Resolved Hospital Problem list     Assessment & Plan:   Acute respiratory failure w/ hypoxia and hypercarbia P: -continue PRVC 6-8 cc/kg -wean fio2 for sats > 92% -will check ABG in 1 hour post mechanical ventilation -RASS goal 0 to -1 with precedex and prn fentanyl -VAP prevention in place -Daily SBT/SAT in am -continue Chest tube; monitor Chest tube output -will advance ETT by 3 cm -trend CXR -trend WBC/fever curve  Esophageal rupture s/p Esophagectomy, leukocytosis, CAD, AKI P: -per primary  Best Practice (right click and "Reselect all SmartList Selections" daily)   Diet/type: NPO DVT prophylaxis: LMWH GI prophylaxis: PPI Lines: Central line Foley:  Yes, and it is still needed Code Status:  full code Last date of multidisciplinary goals of care discussion [per primary]  Labs   CBC: Recent Labs  Lab 09/03/20 0910 09/04/20 0426 09/05/20 0359 09/06/20 0455 09/07/20 0219 09/07/20 1522 09/07/20 1628  WBC 12.5* 11.1* 9.0 12.7* 16.8*  --   --   HGB  11.0* 10.2* 10.4* 11.3* 11.2* 11.9* 10.9*  HCT 33.3* 31.1* 31.3* 34.8* 34.1* 35.0* 32.0*  MCV 96.5 97.2 95.7 95.3 93.4  --   --   PLT 172 185 204 281 297  --   --     Basic Metabolic Panel: Recent Labs  Lab 09/03/20 0910 09/04/20 0426 09/05/20 0359 09/06/20 0455 09/07/20 0219 09/07/20 1522 09/07/20 1628  NA 132* 133* 133* 133* 132* 136 136  K 3.9 3.8 3.7 3.9 3.8 4.2 4.1  CL 98 104 102 103 102  --   --   CO2 27 26 26 23 23   --   --   GLUCOSE 108* 86 91 105* 120*  --   --   BUN 10  9 9 14  26*  --   --   CREATININE 0.61 0.60* 0.58* 0.76 1.03  --   --   CALCIUM 8.7* 8.3* 8.3* 8.2* 8.2*  --   --    GFR: Estimated Creatinine Clearance: 72.4 mL/min (by C-G formula based on SCr of 1.03 mg/dL). Recent Labs  Lab 09/04/20 0426 09/05/20 0359 09/06/20 0455 09/07/20 0219  WBC 11.1* 9.0 12.7* 16.8*    Liver Function Tests: No results for input(s): AST, ALT, ALKPHOS, BILITOT, PROT, ALBUMIN in the last 168 hours. No results for input(s): LIPASE, AMYLASE in the last 168 hours. No results for input(s): AMMONIA in the last 168 hours.  ABG    Component Value Date/Time   PHART 7.234 (L) 09/07/2020 1522   PCO2ART 57.6 (H) 09/07/2020 1522   PO2ART 69 (L) 09/07/2020 1522   HCO3 22.4 09/07/2020 1628   TCO2 24 09/07/2020 1628   ACIDBASEDEF 5.0 (H) 09/07/2020 1628   O2SAT 100.0 09/07/2020 1628     Coagulation Profile: No results for input(s): INR, PROTIME in the last 168 hours.  Cardiac Enzymes: No results for input(s): CKTOTAL, CKMB, CKMBINDEX, TROPONINI in the last 168 hours.  HbA1C: Hgb A1c MFr Bld  Date/Time Value Ref Range Status  04/28/2019 04:03 PM 4.9 4.8 - 5.6 % Final    Comment:    (NOTE) Pre diabetes:          5.7%-6.4% Diabetes:              >6.4% Glycemic control for   <7.0% adults with diabetes     CBG: Recent Labs  Lab 09/07/20 0205 09/07/20 0413 09/07/20 0623 09/07/20 1135 09/07/20 1624  GLUCAP 75 79 92 101* 112*    Review of Systems:   Unable to obtain due to intubated/sedated. Information obtained from chart and nurse.  Past Medical History:  He,  has a past medical history of Anxiety, Coronary artery disease, Myocardial infarction (HCC), and Stroke (HCC).   Surgical History:   Past Surgical History:  Procedure Laterality Date   APPENDECTOMY     BALLOON DILATION N/A 06/01/2019   Procedure: BALLOON DILATION;  Surgeon: 11/07/20, MD;  Location: [facility] ENDOSCOPY;  Service: Endoscopy;  Laterality: N/A;   BALLOON DILATION N/A 07/16/2019    Procedure: BALLOON DILATION;  Surgeon: Vida Rigger, MD;  Location: [facility];  Service: Endoscopy;  Laterality: N/A;   BALLOON DILATION N/A 09/09/2019   Procedure: BALLOON DILATION;  Surgeon: Vida Rigger, MD;  Location: [facility];  Service: Endoscopy;  Laterality: N/A;   BIOPSY  04/29/2019   Procedure: BIOPSY;  Surgeon: Vida Rigger, MD;  Location: [facility] ENDOSCOPY;  Service: Endoscopy;;   BIOPSY  05/27/2019   Procedure: BIOPSY;  Surgeon: [facility], MD;  Location: [facility];  Service:  Endoscopy;;   BIOPSY  06/01/2019   Procedure: BIOPSY;  Surgeon: Vida RiggerMagod, Marc, MD;  Location: [facility];  Service: Endoscopy;;   BIOPSY  07/16/2019   Procedure: BIOPSY;  Surgeon: Vida RiggerMagod, Marc, MD;  Location: [facility];  Service: Endoscopy;;   BIOPSY  05/27/2020   Procedure: BIOPSY;  Surgeon: Napoleon FormNandigam, Kavitha V, MD;  [facility];  Service: Endoscopy;;   ESOPHAGOGASTRODUODENOSCOPY N/A 06/01/2019   Procedure: ESOPHAGOGASTRODUODENOSCOPY (EGD);  Surgeon: Vida RiggerMagod, Marc, MD;  [facility];  Service: Endoscopy;  Laterality: N/A;   ESOPHAGOGASTRODUODENOSCOPY N/A 05/31/2020   Procedure: ESOPHAGOGASTRODUODENOSCOPY (EGD);  Surgeon: Corliss SkainsLightfoot, Harrell O, MD;  [facility];  Service: Thoracic;  Laterality: N/A;   ESOPHAGOGASTRODUODENOSCOPY N/A 09/01/2020   Procedure: ESOPHAGOGASTRODUODENOSCOPY (EGD);  Surgeon: Corliss SkainsLightfoot, Harrell O, MD;  [facility];  Service: Thoracic;  Laterality: N/A;   ESOPHAGOGASTRODUODENOSCOPY (EGD) WITH PROPOFOL N/A 04/29/2019   Procedure: ESOPHAGOGASTRODUODENOSCOPY (EGD) WITH PROPOFOL;  Surgeon: Charlott RakesSchooler, Vincent, MD;  [facility];  Service: Endoscopy;  Laterality: N/A;   ESOPHAGOGASTRODUODENOSCOPY (EGD) WITH PROPOFOL N/A 05/27/2019   Procedure: ESOPHAGOGASTRODUODENOSCOPY (EGD) WITH PROPOFOL;  Surgeon: Willis Modenautlaw, William, MD;  Location: [facility];  Service: Endoscopy;  Laterality: N/A;   ESOPHAGOGASTRODUODENOSCOPY (EGD) WITH PROPOFOL N/A 07/16/2019    Procedure: ESOPHAGOGASTRODUODENOSCOPY (EGD) WITH PROPOFOL;  Surgeon: Vida RiggerMagod, Marc, MD;  Location: [facility];  Service: Endoscopy;  Laterality: N/A;   ESOPHAGOGASTRODUODENOSCOPY (EGD) WITH PROPOFOL N/A 09/09/2019   Procedure: ESOPHAGOGASTRODUODENOSCOPY (EGD) WITH PROPOFOL;  Surgeon: Vida RiggerMagod, Marc, MD;  Location: [facility];  Service: Endoscopy;  Laterality: N/A;   ESOPHAGOGASTRODUODENOSCOPY (EGD) WITH PROPOFOL N/A 05/27/2020   Procedure: ESOPHAGOGASTRODUODENOSCOPY (EGD) WITH PROPOFOL;  Surgeon: Napoleon FormNandigam, Kavitha V, MD;  Location: [facility] ENDOSCOPY;  Service: Endoscopy;  Laterality: N/A;   FINE NEEDLE ASPIRATION  09/09/2019   Procedure: FINE NEEDLE ASPIRATION (FNA) LINEAR;  Surgeon: Vida RiggerMagod, Marc, MD;  Location: [facility];  Service: Endoscopy;;   FOREIGN BODY REMOVAL  07/16/2019   Procedure: FOREIGN BODY REMOVAL;  Surgeon: Vida RiggerMagod, Marc, MD;  Location: [facility];  Service: Endoscopy;;   FOREIGN BODY REMOVAL  05/27/2020   Procedure: FOREIGN BODY REMOVAL;  Surgeon: Napoleon FormNandigam, Kavitha V, MD;  Location: [facility]OSCOPY;  Service: Endoscopy;;   INTERCOSTAL NERVE BLOCK Right 09/01/2020   Procedure: INTERCOSTAL NERVE BLOCK;  Surgeon: Corliss SkainsLightfoot, Harrell O, MD;  Location: [facility] OR;  Service: Thoracic;  Laterality: Right;   LAPAROSCOPIC JEJUNOSTOMY N/A 05/31/2020   Procedure: LAPAROSCOPIC JEJUNOSTOMY;  Surgeon: Corliss SkainsLightfoot, Harrell O, MD;  Location: [facility] OR;  Service: Thoracic;  Laterality: N/A;  EGD, C-arm required.   SHOULDER SURGERY     UPPER ESOPHAGEAL ENDOSCOPIC ULTRASOUND (EUS) N/A 09/09/2019   Procedure: UPPER ESOPHAGEAL ENDOSCOPIC ULTRASOUND (EUS);  Surgeon: Willis Modenautlaw, William, MD;  [facility];  Service: Endoscopy;  Laterality: N/A;     Social History:   reports that he has been smoking cigarettes. He has a 20.00 pack-year smoking history. He has never used smokeless tobacco. He reports current alcohol use of about 2.0 standard drinks of alcohol per week. He reports previous drug use.   Family History:  His  family history includes Healthy in his mother; Heart attack in his father.   Allergies No Known Allergies   Home Medications  Prior to Admission medications   Medication Sig Start Date End Date Taking? Authorizing Provider  diclofenac Sodium (VOLTAREN) 1 % GEL Apply 2 g topically daily as needed (pain to left axillary region). 06/10/20  Yes Derrel Nipresenzo, Victor, MD  gabapentin (NEURONTIN) 250 MG/5ML solution Place 6 mLs (300 mg total) into  feeding tube every 8 (eight) hours. 07/08/20  Yes Lightfoot, Eliezer Lofts, MD  lidocaine (LIDODERM) 5 % Place 1 patch onto the skin daily. Remove & Discard patch within 12 hours or as directed by MD 06/14/20  Yes [name], [name], MD  Nutritional Supplements (FEEDING SUPPLEMENT, JEVITY 1.2 CAL,) LIQD Place 1,000 mLs into feeding tube continuous. 06/10/20  Yes Derrel Nip, MD  oxyCODONE (ROXICODONE) 5 MG/5ML solution Place 5 mLs (5 mg total) into feeding tube at bedtime as needed for severe pain. Patient taking differently: Place 5 mg into feeding tube 2 (two) times daily as needed for severe pain. 07/08/20  Yes Lightfoot, Eliezer Lofts, MD  polyvinyl alcohol (LIQUIFILM TEARS) 1.4 % ophthalmic solution Place 1 drop into both eyes as needed for dry eyes.   Yes [provider]  acetaminophen (TYLENOL) 160 MG/5ML suspension Place 20.3 mLs (650 mg total) into feeding tube every 6 (six) hours. Patient not taking: No sig reported 06/10/20   Derrel Nip, MD  mupirocin ointment (BACTROBAN) 2 % Apply 1 application topically 2 (two) times daily. Patient not taking: No sig reported 06/23/20   Moses Manners, MD  pantoprazole sodium (PROTONIX) 40 mg/20 mL PACK Place 20 mLs (40 mg total) into feeding tube 2 (two) times daily. Patient not taking: No sig reported 06/10/20   Derrel Nip, MD  sennosides (SENOKOT) 8.8 MG/5ML syrup Place 5 mLs into feeding tube daily. Patient not taking: No sig reported 06/11/20   Derrel Nip, MD  sucralfate (CARAFATE) 1 GM/10ML  suspension Place 10 mLs (1 g total) into feeding tube 4 (four) times daily -  with meals and at bedtime. Patient not taking: No sig reported 07/08/20   Corliss Skains, MD     Critical care time: 45 minutes    JD Anselm Lis [facility] 09/07/2020, 5:36 PM  Please see [email or web address] for pager details.  From 7A-7P if no response, please call [phone]. After hours, please call ELink [phone].

## 2020-09-07 NOTE — Anesthesia Procedure Notes (Signed)
Arterial Line Insertion Start/End8/04/2020 1:40 PM, 09/07/2020 1:50 PM Performed by: Cecile Hearing, MD, anesthesiologist  Patient location: Pre-op. Preanesthetic checklist: patient identified, IV checked, site marked, risks and benefits discussed, surgical consent, monitors and equipment checked, pre-op evaluation, timeout performed and anesthesia consent Lidocaine 1% used for infiltration Right, radial was placed Catheter size: 20 G Hand hygiene performed  and maximum sterile barriers used   Attempts: 1 Procedure performed without using ultrasound guided technique. Following insertion, dressing applied and Biopatch. Post procedure assessment: normal and unchanged  Post procedure complications: unsuccessful attempts and second provider assisted. Patient tolerated the procedure well with no immediate complications. Additional procedure comments: Multiple attempts by 2 CRNAs without success in threading wire.  Attempt x1 by MDA on right with success.Marland Kitchen

## 2020-09-07 NOTE — Brief Op Note (Signed)
09/01/2020 - 09/07/2020  4:22 PM  PATIENT:  Ralph [name]  55 y.o. male  PRE-OPERATIVE DIAGNOSIS:  Anastomotic leak  POST-OPERATIVE DIAGNOSIS:  Anastomotic leak  PROCEDURE:  Procedure(s): XI ROBOTIC ASSISTED THORASCOPY-DECORTICATION & REVISION OF ESOPHAGECTOMY (Right) ESOPHAGOGASTRODUODENOSCOPY (EGD) (N/A) INTERCOSTAL NERVE BLOCK (Right)  SURGEON:  Surgeon(s) and Role:    * Lightfoot, Eliezer Lofts, MD - Primary  PHYSICIAN ASSISTANT: Esmeralda Malay PA-C  ANESTHESIA:   general  EBL:  MINIMAL   BLOOD ADMINISTERED:none  DRAINS:  ONE CHEST TUBE AND 2 BLAKE DRAINS    LOCAL MEDICATIONS USED:   EXPAREL  SPECIMEN:  No Specimen  DISPOSITION OF SPECIMEN:  N/A  COUNTS:  YES  TOURNIQUET:  * No tourniquets in log *  DICTATION: .Dragon Dictation  PLAN OF CARE: Admit to inpatient   PATIENT DISPOSITION:  ICU - extubated and stable.   Delay start of Pharmacological VTE agent (>24hrs) due to surgical blood loss or risk of bleeding: yes  COMPLICATIONS: NO KNOWN

## 2020-09-07 NOTE — Plan of Care (Signed)
  Problem: Education: Goal: Knowledge of the prescribed therapeutic regimen will improve Outcome: Progressing   Problem: Bowel/Gastric: Goal: Gastrointestinal status for postoperative course will improve Outcome: Progressing   Problem: Nutritional: Goal: Ability to achieve adequate nutritional intake will improve Outcome: Progressing   Problem: Clinical Measurements: Goal: Postoperative complications will be avoided or minimized Outcome: Progressing   

## 2020-09-07 NOTE — Progress Notes (Signed)
eLink Physician-Brief Progress Note Patient Name: JOHNSTON MADDOCKS DOB: 02/05/1966 MRN: [id number]   Date of Service  09/07/2020  HPI/Events of Note  Hypotension - BP = 70/38 with MAP = 51. HR = 65. Hgb = 10.9. Patient has CVL.  eICU Interventions  Plan: Norepinephrine IV infusion. Titrate to MAP >= 65. Monitor CVP now and Q 4 hours.     Intervention Category Major Interventions: Hypotension - evaluation and management  Zachry Hopfensperger Dennard Nip 09/07/2020, 7:34 PM

## 2020-09-08 ENCOUNTER — Inpatient Hospital Stay (HOSPITAL_COMMUNITY): Payer: Medicaid Other

## 2020-09-08 ENCOUNTER — Encounter (HOSPITAL_COMMUNITY): Payer: Self-pay | Admitting: Thoracic Surgery (Cardiothoracic Vascular Surgery)

## 2020-09-08 DIAGNOSIS — J9601 Acute respiratory failure with hypoxia: Secondary | ICD-10-CM | POA: Diagnosis not present

## 2020-09-08 HISTORY — PX: IR REPLC DUODEN/JEJUNO TUBE PERCUT W/FLUORO: IMG2334

## 2020-09-08 LAB — POCT I-STAT 7, (LYTES, BLD GAS, ICA,H+H)
Acid-base deficit: 3 mmol/L — ABNORMAL HIGH (ref 0.0–2.0)
Bicarbonate: 20.7 mmol/L (ref 20.0–28.0)
Calcium, Ion: 1.18 mmol/L (ref 1.15–1.40)
HCT: 32 % — ABNORMAL LOW (ref 39.0–52.0)
Hemoglobin: 10.9 g/dL — ABNORMAL LOW (ref 13.0–17.0)
O2 Saturation: 97 %
Patient temperature: 100.7
Potassium: 3.7 mmol/L (ref 3.5–5.1)
Sodium: 135 mmol/L (ref 135–145)
TCO2: 22 mmol/L (ref 22–32)
pCO2 arterial: 32.8 mmHg (ref 32.0–48.0)
pH, Arterial: 7.414 (ref 7.350–7.450)
pO2, Arterial: 98 mmHg (ref 83.0–108.0)

## 2020-09-08 LAB — MAGNESIUM
Magnesium: 1.9 mg/dL (ref 1.7–2.4)
Magnesium: 1.7 mg/dL (ref 1.7–2.4)

## 2020-09-08 LAB — CBC
HCT: 32.2 % — ABNORMAL LOW (ref 39.0–52.0)
Hemoglobin: 11 g/dL — ABNORMAL LOW (ref 13.0–17.0)
MCH: 31.5 pg (ref 26.0–34.0)
MCHC: 34.2 g/dL (ref 30.0–36.0)
MCV: 92.3 fL (ref 80.0–100.0)
Platelets: 347 10*3/uL (ref 150–400)
RBC: 3.49 MIL/uL — ABNORMAL LOW (ref 4.22–5.81)
RDW: 15.1 % (ref 11.5–15.5)
WBC: 22.3 10*3/uL — ABNORMAL HIGH (ref 4.0–10.5)
nRBC: 0 % (ref 0.0–0.2)

## 2020-09-08 LAB — BASIC METABOLIC PANEL
Anion gap: 11 (ref 5–15)
BUN: 18 mg/dL (ref 6–20)
CO2: 19 mmol/L — ABNORMAL LOW (ref 22–32)
Calcium: 8.2 mg/dL — ABNORMAL LOW (ref 8.9–10.3)
Chloride: 100 mmol/L (ref 98–111)
Creatinine, Ser: 0.73 mg/dL (ref 0.61–1.24)
GFR, Estimated: >60 mL/min (ref >60)
Glucose, Bld: 120 mg/dL — ABNORMAL HIGH (ref 70–99)
Potassium: 3.8 mmol/L (ref 3.5–5.1)
Sodium: 130 mmol/L — ABNORMAL LOW (ref 135–145)

## 2020-09-08 LAB — GLUCOSE, CAPILLARY
Glucose-Capillary: 101 mg/dL — ABNORMAL HIGH (ref 70–99)
Glucose-Capillary: 110 mg/dL — ABNORMAL HIGH (ref 70–99)
Glucose-Capillary: 116 mg/dL — ABNORMAL HIGH (ref 70–99)
Glucose-Capillary: 120 mg/dL — ABNORMAL HIGH (ref 70–99)
Glucose-Capillary: 161 mg/dL — ABNORMAL HIGH (ref 70–99)
Glucose-Capillary: 99 mg/dL (ref 70–99)

## 2020-09-08 LAB — PHOSPHORUS: Phosphorus: 2.9 mg/dL (ref 2.5–4.6)

## 2020-09-08 MED ORDER — DEXTROSE IN LACTATED RINGERS 5 % IV SOLN: INTRAVENOUS | Status: AC

## 2020-09-08 MED ORDER — LIDOCAINE VISCOUS HCL 2 % MT SOLN
OROMUCOSAL | Status: AC
  Filled 2020-09-08: qty 15

## 2020-09-08 MED ORDER — LACTATED RINGERS IV BOLUS
1000.0000 mL | Freq: Once | INTRAVENOUS | Status: AC
  Administered 2020-09-08: 1000 mL via INTRAVENOUS

## 2020-09-08 MED ORDER — PROSOURCE TF PO LIQD
45.0000 mL | Freq: Two times a day (BID) | ORAL | Status: DC
  Administered 2020-09-08 (×2): 45 mL
  Filled 2020-09-08 (×2): qty 45

## 2020-09-08 MED ORDER — PANTOPRAZOLE SODIUM 40 MG IV SOLR
40.0000 mg | INTRAVENOUS | Status: DC
  Administered 2020-09-08 – 2020-10-15 (×38): 40 mg via INTRAVENOUS
  Filled 2020-09-08 (×38): qty 40

## 2020-09-08 MED ORDER — LIDOCAINE HCL 1 % IJ SOLN
INTRAMUSCULAR | Status: AC
  Filled 2020-09-08: qty 20

## 2020-09-08 MED ORDER — MAGNESIUM SULFATE 2 GM/50ML IV SOLN
2.0000 g | Freq: Once | INTRAVENOUS | Status: AC
  Administered 2020-09-08: 2 g via INTRAVENOUS
  Filled 2020-09-08: qty 50

## 2020-09-08 MED ORDER — IOHEXOL 300 MG/ML  SOLN
50.0000 mL | Freq: Once | INTRAMUSCULAR | Status: AC | PRN
  Administered 2020-09-08: 20 mL

## 2020-09-08 MED ORDER — VITAL HIGH PROTEIN PO LIQD: 1000.0000 mL | ORAL | Status: DC

## 2020-09-08 MED ORDER — LACTATED RINGERS IV BOLUS
750.0000 mL | Freq: Once | INTRAVENOUS | Status: AC
  Administered 2020-09-08: 750 mL via INTRAVENOUS

## 2020-09-08 MED ORDER — VITAL 1.5 CAL PO LIQD
1000.0000 mL | ORAL | Status: DC
  Administered 2020-09-08: 1000 mL

## 2020-09-08 MED ORDER — DEXMEDETOMIDINE HCL IN NACL 400 MCG/100ML IV SOLN
0.0000 ug/kg/h | INTRAVENOUS | Status: AC
  Administered 2020-09-08 (×2): 0.4 ug/kg/h via INTRAVENOUS
  Administered 2020-09-09: 0.6 ug/kg/h via INTRAVENOUS
  Administered 2020-09-10 (×2): 1.2 ug/kg/h via INTRAVENOUS
  Administered 2020-09-10: 0.8 ug/kg/h via INTRAVENOUS
  Filled 2020-09-08 (×8): qty 100

## 2020-09-08 NOTE — Progress Notes (Signed)
[address]       [name] [address]             [phone]                 1 Day Post-Op Procedure(s) (LRB): XI ROBOTIC ASSISTED THORASCOPY-DECORTICATION & REVISION OF ESOPHAGECTOMY (Right) ESOPHAGOGASTRODUODENOSCOPY (EGD) (N/A) INTERCOSTAL NERVE BLOCK (Right)   Events: Remains intubated.  Tachypneic on SBT. _______________________________________________________________ Vitals: BP 117/79   Pulse (!) 54   Temp (!) 96.9 F (36.1 C) (Oral)   Resp 18   Ht 5\' 8"  (1.727 m)   Wt 60.6 kg   SpO2 95%   BMI 20.31 kg/m   - Neuro: Arousable.  - Cardiovascular: Sinus  Drips: Levophed at 16.   CVP:  [2 mmHg-8 mmHg] 3 mmHg  - Pulm:  Vent Mode: PRVC FiO2 (%):  [40 %-50 %] 40 % Set Rate:  [12 bmp-15 bmp] 15 bmp Vt Set:  [540 mL] 540 mL PEEP:  [5 cmH20] 5 cmH20 Pressure Support:  [8 cmH20-10 cmH20] 10 cmH20 Plateau Pressure:  [15 cmH20-27 cmH20] 22 cmH20  ABG    Component Value Date/Time   PHART 7.414 09/08/2020 0444   PCO2ART 32.8 09/08/2020 0444   PO2ART 98 09/08/2020 0444   HCO3 20.7 09/08/2020 0444   TCO2 22 09/08/2020 0444   ACIDBASEDEF 3.0 (H) 09/08/2020 0444   O2SAT 97.0 09/08/2020 0444    - Abd: Soft - Extremity: Cool  .Intake/Output      08/03 0701 08/04 0700 08/04 0701 08/05 0700   P.O. 0    I.V. (mL/kg) 3004.7 (49.6) 362.2 (6)   NG/GT 0    IV Piggyback 2279.7 50.2   Total Intake(mL/kg) 5284.4 (87.2) 412.4 (6.8)   Urine (mL/kg/hr) 998 (0.7) 400 (0.7)   Emesis/NG output 225    Drains 315 20   Stool     Blood 50    Chest Tube 460 130   Total Output 2048 550   Net +3236.4 -137.6           _______________________________________________________________ Labs: CBC Latest Ref Rng & Units 09/08/2020 09/08/2020 09/07/2020  WBC 4.0 - 10.5 K/uL - 22.3(H) -  Hemoglobin 13.0 - 17.0 g/dL 10.9(L) 11.0(L) 9.5(L)  Hematocrit 39.0 - 52.0 % 32.0(L) 32.2(L) 28.0(L)  Platelets 150 - 400 K/uL - 347 -   CMP Latest Ref Rng & Units 09/08/2020  09/08/2020 09/07/2020  Glucose 70 - 99 mg/dL - 11/07/2020) -  BUN 6 - 20 mg/dL - 18 -  Creatinine 315(Q - 1.24 mg/dL - 0.08 -  Sodium 6.76 - 145 mmol/L 135 130(L) 138  Potassium 3.5 - 5.1 mmol/L 3.7 3.8 4.0  Chloride 98 - 111 mmol/L - 100 -  CO2 22 - 32 mmol/L - 19(L) -  Calcium 8.9 - 10.3 mg/dL - 8.2(L) -  Total Protein 6.5 - 8.1 g/dL - - -  Total Bilirubin 0.3 - 1.2 mg/dL - - -  Alkaline Phos 38 - 126 U/L - - -  AST 15 - 41 U/L - - -  ALT 0 - 44 U/L - - -    CXR: No significant change  _______________________________________________________________  Assessment and Plan: POD 7 s/p robotic Ivor Lewis esophagectomy, postop day 1 status post reexploration with reinforcement of anastomosis.  Neuro: Wean sedation as tolerated CV: Wean Levophed as tolerated Pulm: Wean vent as tolerated.  Appreciate pulmonary critical care. Renal: Creatinine stable. GI: Going down for replacement of feeding tube today.  We will plan for TPN this is not done. Heme: Stable ID: On Vanco Zosyn and Diflucan Endo: Sliding scale insulin Dispo: Continue ICU care.   Corliss Skains 09/08/2020 4:04 PM

## 2020-09-08 NOTE — Anesthesia Postprocedure Evaluation (Addendum)
Anesthesia Post Note  Patient: Ralph [name]  Procedure(s) Performed: XI ROBOTIC ASSISTED THORASCOPY-DECORTICATION & REVISION OF ESOPHAGECTOMY (Right: Chest) ESOPHAGOGASTRODUODENOSCOPY (EGD) INTERCOSTAL NERVE BLOCK (Right: Chest)     Patient location during evaluation: SICU Anesthesia Type: General Level of consciousness: sedated Pain management: pain level controlled Vital Signs Assessment: post-procedure vital signs reviewed and stable Respiratory status: patient remains intubated per anesthesia plan Cardiovascular status: stable Postop Assessment: no apparent nausea or vomiting Anesthetic complications: no Comments: Patient failed trial of extubation in OR. Re-intubated in OR and transported to ICU in stable condition.   No notable events documented.  Last Vitals:  Vitals:   09/08/20 1919 09/08/20 1936  BP:    Pulse:    Resp:    Temp:  36.8 C  SpO2: 100%     Last Pain:  Vitals:   09/08/20 1936  TempSrc: Oral  PainSc:                  Cecile Hearing

## 2020-09-08 NOTE — Progress Notes (Signed)
      [address]       [name] [address]             [phone]      POD # 1 Decortication, reinforcement of anastamosis  Intubated. Sedated but does wake up and follow commands  BP 124/78   Pulse (!) 50   Temp (!) 96.9 F (36.1 C) (Oral)   Resp 19   Ht 5\' 8"  (1.727 m)   Wt 60.6 kg   SpO2 97%   BMI 20.31 kg/m  CVP= 4 Norepi @12    Intake/Output Summary (Last 24 hours) at 09/08/2020 1716 Last data filed at 09/08/2020 1700 Gross per 24 hour  Intake 3149.07 ml  Output 1643 ml  Net 1506.07 ml   CBG well controlled  Continue current Rx  Jensen Cheramie C. 11/08/2020, MD [facility] [phone]

## 2020-09-08 NOTE — Consult Note (Signed)
NAME:  Ralph [name], MRN:  [id number], DOB:  06/26/1965, LOS: 7 ADMISSION DATE:  09/01/2020, CONSULTATION DATE:  8/3 REFERRING MD:  Dr. Cliffton Asters, CHIEF COMPLAINT:  esophageal rupture esophageal stricture   History of Present Illness:  Patient is a 55 yo M w/ PMH anxiety, CAD, MI, stroke presents to [facility] on 7/28 for esophagectomy w/ Dr. Cliffton Asters for esophageal stricture.  On 7/28, Dr. Cliffton Asters performed the esophagectomy without complication.  8/3 patient taken back to OR for esophageal rupture s/p esophagectomy by Dr. Cliffton Asters. Unable to extubate post procedure. Patient remains intubated on mechanical ventilation. ABG with respiratory acidosis.   PCCM consulted for vent management.  Pertinent  Medical History   Past Medical History:  Diagnosis Date   Anxiety    Coronary artery disease    Myocardial infarction [facility])    Stroke (HCC)      Significant Hospital Events: Including procedures, antibiotic start and stop dates in addition to other pertinent events   7/28 admitted for planned esophagectomy. Chest tube was placed on right chest. J tube in place. 2 JP drains in place. Patient was extubated post procedure. Patient developed subcutaneous emphysema. Unable to use J tube due to leakage around site. 8/3: PCCM consulted for vent management after pt brought back to OR for esophageal rupture/ leak. Vanc/zosyn/fluconazole started. Hypotension noted post-op. Felt 2/2 Sepsis; treated w/ volume and pressors.  8/4: Still on norepinephrine.  Central venous pressure suggesting he is still hypovolemic so receiving fluid.  Spontaneous breathing trial initiated.  Culture sent from chest tube.  Interim History / Subjective:  Easily arousable.  Getting as needed pain medication.  Placed on spontaneous breathing trial with acceptable volumes  Objective   Blood pressure 119/79, pulse 70, temperature (Abnormal) 100.7 F (38.2 C), temperature source Oral, resp. rate (Abnormal) 21, height 5\' 8"   (1.727 m), weight 60.6 kg, SpO2 97 %. CVP:  [3 mmHg-8 mmHg] 6 mmHg  Vent Mode: PRVC FiO2 (%):  [40 %-50 %] 40 % Set Rate:  [12 bmp-15 bmp] 15 bmp Vt Set:  [540 mL] 540 mL PEEP:  [5 cmH20] 5 cmH20 Pressure Support:  [10 cmH20] 10 cmH20 Plateau Pressure:  [15 cmH20-26 cmH20] 26 cmH20   Intake/Output Summary (Last 24 hours) at 09/08/2020 0720 Last data filed at 09/08/2020 0600 Gross per 24 hour  Intake 5172.09 ml  Output 2048 ml  Net 3124.09 ml   Filed Weights   09/07/20 0600 09/07/20 1308 09/08/20 0426  Weight: 61 kg 61 kg 60.6 kg    Examination:  General: 55 year old white male currently on Precedex infusion and low-dose norepinephrine HEENT mucous membranes moist, orally intubated, temporal wasting noted.  Nasogastric tube at low intermittent suction.  Right IJ triple-lumen catheter with dressing intact Pulmonary: Some coarse basilar rales, no accessory use on spontaneous breathing trial titling approximately 300 -400 cc tidal volumes.  Right chest tube with cloudy pleural fluid, 2 9 uncharged JP tubes on the right, dressing intact.  Still has some mild subcutaneous air palpable over the right anterior chest Cardiac: Bradycardic regular rhythm without murmur rub or gallop Abdomen: Soft not tender no organomegaly, J-tube in place dressing intact this is not been exchanged yet GU: Clear yellow urine Via Foley catheter Neuro: Easily arousable moves all extremities follows commands no focal deficits appreciated. Extremities: Warm dry with brisk capillary refill and strong pulses  Resolved Hospital Problem list   AKI  Assessment & Plan:   Acute respiratory failure w/ hypoxia and hypercarbia Pcxr personally reviewed: ett,  CVL and CTs in satisfactory position. Bilateral airspace disease w/ progression on right.  -abg reviewed -Placed on spontaneous breathing trial Plan SBT prob can extubate today  PAD protocol RASS goal 0 to -1 VAP bundle   Septic shock 2/2 Esophageal rupture s/p  Esophagectomy Plan Volume challenge Titrate norepinephrine for mean arterial pressure greater than 65 Continue to trend CVP Send culture from chest tube Continue Vanco/Zosyn/fluconazole Chest tube management per primary team  Fluid and electrolyte imbalance: Hyponatremia Plan Monitor  H/o CAD Plan Monitor telemetry  Mild anemia  Plan Trend CBC   Best Practice (right click and "Reselect all SmartList Selections" daily)   Diet/type: NPO DVT prophylaxis: LMWH GI prophylaxis: PPI Lines: Central line Foley:  Yes, and it is still needed Code Status:  full code Last date of multidisciplinary goals of care discussion [per primary]   Critical care time: 32 min    [name] ACNP-BC [facility] Pulmonary/Critical Care Pager # [phone] OR # [phone] if no answer

## 2020-09-08 NOTE — Progress Notes (Signed)
More labored + accessory use. Had planned on extubation but don't think he's ready Plan Cont supportive care PSV as tol today   Re-assess am   [name] ACNP-BC [facility] Pulmonary/Critical Care Pager # [phone] OR # [phone] if no answer

## 2020-09-08 NOTE — Progress Notes (Signed)
[facility] ADULT ICU REPLACEMENT PROTOCOL   The patient does apply for the [facility] Adult ICU Electrolyte Replacment Protocol based on the criteria listed below:   1.Exclusion criteria: TCTS patients, ECMO patients and Hypothermia Protocol, and   Dialysis patients 2. Is GFR >/= 30 ml/min? Yes.    Patient's GFR today is >60 3. Is SCr </= 2? Yes.   Patient's SCr is 0.73 mg/dL 4. Did SCr increase >/= 0.5 in 24 hours? No. 5.Pt's weight >40kg  Yes.   6. Abnormal electrolyte Mg 1.9 7. Electrolytes replaced per protocol   Ardelle Park 09/08/2020 5:13 AM

## 2020-09-08 NOTE — Progress Notes (Signed)
Pt transported to IR and back to 2H 6 on full vent support. No complications noted.

## 2020-09-09 ENCOUNTER — Inpatient Hospital Stay: Payer: Self-pay

## 2020-09-09 ENCOUNTER — Inpatient Hospital Stay (HOSPITAL_COMMUNITY): Payer: Medicaid Other

## 2020-09-09 DIAGNOSIS — K9413 Enterostomy malfunction: Secondary | ICD-10-CM

## 2020-09-09 DIAGNOSIS — J9601 Acute respiratory failure with hypoxia: Secondary | ICD-10-CM | POA: Diagnosis not present

## 2020-09-09 LAB — GLUCOSE, CAPILLARY
Glucose-Capillary: 100 mg/dL — ABNORMAL HIGH (ref 70–99)
Glucose-Capillary: 121 mg/dL — ABNORMAL HIGH (ref 70–99)
Glucose-Capillary: 123 mg/dL — ABNORMAL HIGH (ref 70–99)
Glucose-Capillary: 127 mg/dL — ABNORMAL HIGH (ref 70–99)
Glucose-Capillary: 129 mg/dL — ABNORMAL HIGH (ref 70–99)
Glucose-Capillary: 72 mg/dL (ref 70–99)

## 2020-09-09 LAB — CBC
HCT: 30.1 % — ABNORMAL LOW (ref 39.0–52.0)
Hemoglobin: 10.2 g/dL — ABNORMAL LOW (ref 13.0–17.0)
MCH: 31.1 pg (ref 26.0–34.0)
MCHC: 33.9 g/dL (ref 30.0–36.0)
MCV: 91.8 fL (ref 80.0–100.0)
Platelets: 292 10*3/uL (ref 150–400)
RBC: 3.28 MIL/uL — ABNORMAL LOW (ref 4.22–5.81)
RDW: 15.1 % (ref 11.5–15.5)
WBC: 22 10*3/uL — ABNORMAL HIGH (ref 4.0–10.5)
nRBC: 0 % (ref 0.0–0.2)

## 2020-09-09 LAB — PHOSPHORUS
Phosphorus: 2.7 mg/dL (ref 2.5–4.6)
Phosphorus: 2.7 mg/dL (ref 2.5–4.6)

## 2020-09-09 LAB — BASIC METABOLIC PANEL
Anion gap: 7 (ref 5–15)
BUN: 10 mg/dL (ref 6–20)
CO2: 25 mmol/L (ref 22–32)
Calcium: 7.8 mg/dL — ABNORMAL LOW (ref 8.9–10.3)
Chloride: 99 mmol/L (ref 98–111)
Creatinine, Ser: 0.57 mg/dL — ABNORMAL LOW (ref 0.61–1.24)
GFR, Estimated: >60 mL/min (ref >60)
Glucose, Bld: 129 mg/dL — ABNORMAL HIGH (ref 70–99)
Potassium: 3.4 mmol/L — ABNORMAL LOW (ref 3.5–5.1)
Sodium: 131 mmol/L — ABNORMAL LOW (ref 135–145)

## 2020-09-09 LAB — ALBUMIN: Albumin: 1.6 g/dL — ABNORMAL LOW (ref 3.5–5.0)

## 2020-09-09 LAB — FUNGUS STAIN

## 2020-09-09 LAB — TRIGLYCERIDES: Triglycerides: 83 mg/dL (ref <150)

## 2020-09-09 LAB — PREALBUMIN: Prealbumin: <5 mg/dL — ABNORMAL LOW (ref 18–38)

## 2020-09-09 LAB — MAGNESIUM
Magnesium: 1.7 mg/dL (ref 1.7–2.4)
Magnesium: 1.6 mg/dL — ABNORMAL LOW (ref 1.7–2.4)

## 2020-09-09 LAB — FUNGAL STAIN REFLEX

## 2020-09-09 MED ORDER — DOCUSATE SODIUM 50 MG/5ML PO LIQD
100.0000 mg | Freq: Two times a day (BID) | ORAL | Status: DC
  Administered 2020-09-11 – 2020-09-26 (×22): 100 mg
  Filled 2020-09-09 (×25): qty 10

## 2020-09-09 MED ORDER — FAT EMUL FISH OIL/PLANT BASED 20% (SMOFLIPID)IV EMUL
INTRAVENOUS | Status: AC
  Filled 2020-09-09: qty 352.8

## 2020-09-09 MED ORDER — DEXTROSE IN LACTATED RINGERS 5 % IV SOLN: INTRAVENOUS | Status: AC

## 2020-09-09 MED ORDER — ROCURONIUM BROMIDE 10 MG/ML (PF) SYRINGE
PREFILLED_SYRINGE | INTRAVENOUS | Status: AC
  Administered 2020-09-09: 60 mg via INTRAVENOUS
  Filled 2020-09-09: qty 10

## 2020-09-09 MED ORDER — POLYETHYLENE GLYCOL 3350 17 G PO PACK
17.0000 g | PACK | Freq: Every day | ORAL | Status: DC
  Administered 2020-09-13 – 2020-09-14 (×2): 17 g
  Filled 2020-09-09 (×4): qty 1

## 2020-09-09 MED ORDER — FENTANYL CITRATE (PF) 100 MCG/2ML IJ SOLN: 100.0000 ug | Freq: Once | INTRAMUSCULAR | Status: AC

## 2020-09-09 MED ORDER — HYDROMORPHONE HCL 1 MG/ML IJ SOLN
INTRAMUSCULAR | Status: AC
  Filled 2020-09-09: qty 1

## 2020-09-09 MED ORDER — POTASSIUM CHLORIDE 10 MEQ/50ML IV SOLN
10.0000 meq | INTRAVENOUS | Status: AC
  Administered 2020-09-09 (×4): 10 meq via INTRAVENOUS
  Filled 2020-09-09 (×4): qty 50

## 2020-09-09 MED ORDER — MIDAZOLAM HCL 2 MG/2ML IJ SOLN
INTRAMUSCULAR | Status: AC
  Filled 2020-09-09: qty 2

## 2020-09-09 MED ORDER — HYDROMORPHONE HCL 1 MG/ML IJ SOLN
1.0000 mg | Freq: Once | INTRAMUSCULAR | Status: AC
  Administered 2020-09-09: 1 mg via INTRAVENOUS

## 2020-09-09 MED ORDER — ROCURONIUM BROMIDE 50 MG/5ML IV SOLN
60.0000 mg | Freq: Once | INTRAVENOUS | Status: AC
  Filled 2020-09-09: qty 6

## 2020-09-09 MED ORDER — FENTANYL BOLUS VIA INFUSION
50.0000 ug | INTRAVENOUS | Status: DC | PRN
  Administered 2020-09-09: 50 ug via INTRAVENOUS
  Administered 2020-09-09: 75 ug via INTRAVENOUS
  Administered 2020-09-09 (×3): 50 ug via INTRAVENOUS
  Administered 2020-09-10: 75 ug via INTRAVENOUS
  Administered 2020-09-10 (×2): 100 ug via INTRAVENOUS
  Filled 2020-09-09: qty 100

## 2020-09-09 MED ORDER — POTASSIUM CHLORIDE 20 MEQ PO PACK: 40.0000 meq | PACK | Freq: Once | ORAL | Status: DC

## 2020-09-09 MED ORDER — FENTANYL CITRATE (PF) 100 MCG/2ML IJ SOLN
50.0000 ug | Freq: Once | INTRAMUSCULAR | Status: AC
  Administered 2020-09-09: 100 ug via INTRAVENOUS

## 2020-09-09 MED ORDER — ETOMIDATE 2 MG/ML IV SOLN: 20.0000 mg | Freq: Once | INTRAVENOUS | Status: AC

## 2020-09-09 MED ORDER — FENTANYL CITRATE (PF) 100 MCG/2ML IJ SOLN
INTRAMUSCULAR | Status: AC
  Administered 2020-09-09: 100 ug via INTRAVENOUS
  Filled 2020-09-09: qty 2

## 2020-09-09 MED ORDER — FENTANYL 2500MCG IN NS 250ML (10MCG/ML) PREMIX INFUSION
50.0000 ug/h | INTRAVENOUS | Status: DC
  Administered 2020-09-09: 50 ug/h via INTRAVENOUS
  Administered 2020-09-10: 200 ug/h via INTRAVENOUS
  Filled 2020-09-09 (×2): qty 250

## 2020-09-09 MED ORDER — SODIUM CHLORIDE 0.9% FLUSH: 10.0000 mL | INTRAVENOUS | Status: DC | PRN

## 2020-09-09 MED ORDER — MIDAZOLAM HCL 2 MG/2ML IJ SOLN
2.0000 mg | INTRAMUSCULAR | Status: DC | PRN
  Administered 2020-09-10 – 2020-09-16 (×17): 2 mg via INTRAVENOUS
  Filled 2020-09-09 (×18): qty 2

## 2020-09-09 MED ORDER — SODIUM CHLORIDE 0.9% FLUSH
10.0000 mL | Freq: Two times a day (BID) | INTRAVENOUS | Status: DC
Start: 2020-09-09 — End: 2020-10-25
  Administered 2020-09-09 – 2020-10-04 (×48): 10 mL
  Administered 2020-10-05 (×2): 30 mL
  Administered 2020-10-06 – 2020-10-08 (×6): 10 mL
  Administered 2020-10-09: 30 mL
  Administered 2020-10-10 – 2020-10-21 (×21): 10 mL
  Administered 2020-10-21 – 2020-10-22 (×2): 30 mL
  Administered 2020-10-22 – 2020-10-24 (×4): 10 mL

## 2020-09-09 MED ORDER — MAGNESIUM SULFATE 2 GM/50ML IV SOLN
2.0000 g | Freq: Once | INTRAVENOUS | Status: AC
  Administered 2020-09-09: 2 g via INTRAVENOUS
  Filled 2020-09-09: qty 50

## 2020-09-09 MED ORDER — ETOMIDATE 2 MG/ML IV SOLN
INTRAVENOUS | Status: AC
  Administered 2020-09-09: 20 mg via INTRAVENOUS
  Filled 2020-09-09: qty 20

## 2020-09-09 NOTE — Progress Notes (Signed)
eLink Physician-Brief Progress Note Patient Name: Ralph [name] DOB: Oct 18, 1965 MRN: [id number]   Date of Service  09/09/2020  HPI/Events of Note  Nursing reports light brown/green  drainage from J tube site that has soaked through several dressings. Currently on D5 LR at 75 mL/hour.  eICU Interventions  Plan: Hold tube feeds.      Intervention Category Major Interventions: Other:  Lenell Antu 09/09/2020, 12:34 AM

## 2020-09-09 NOTE — Progress Notes (Signed)
eLink Physician-Brief Progress Note Patient Name: Ralph [name] DOB: Mar 20, 1965 MRN: [id number]   Date of Service  09/09/2020  HPI/Events of Note  Notified of restlessness and attempt to self extubate.   Pt had failed extubation today and is on fentanyl gtt and precedex gtt but still was awake and reached for ETT.  eICU Interventions  Add versed prn.  Wrist restraints ordered.     Intervention Category Intermediate Interventions: Other:  Larinda Buttery 09/09/2020, 10:01 PM

## 2020-09-09 NOTE — Progress Notes (Signed)
[address]       [name] [address]             [phone]                 2 Days Post-Op Procedure(s) (LRB): XI ROBOTIC ASSISTED THORASCOPY-DECORTICATION & REVISION OF ESOPHAGECTOMY (Right) ESOPHAGOGASTRODUODENOSCOPY (EGD) (N/A) INTERCOSTAL NERVE BLOCK (Right)   Events: Failed extubation today. Jejunostomy tube was exchanged yesterday.  It is still not functioning well.  Every time tube feeds are initiated the patient has significant output from around the insertion site.  It is all bilious. _______________________________________________________________ Vitals: BP 112/77   Pulse (!) 57   Temp 97.7 F (36.5 C) (Oral)   Resp (!) 23   Ht 5\' 8"  (1.727 m)   Wt 63.2 kg   SpO2 93%   BMI 21.19 kg/m   - Neuro: Arousable.  - Cardiovascular: Sinus  Drips: Levophed at 10.   CVP:  [0 mmHg-15 mmHg] 13 mmHg  - Pulm:  Vent Mode: PRVC FiO2 (%):  [40 %-80 %] 40 % Set Rate:  [15 bmp] 15 bmp Vt Set:  [540 mL] 540 mL PEEP:  [5 cmH20] 5 cmH20 Pressure Support:  [8 cmH20] 8 cmH20 Plateau Pressure:  [18 cmH20-27 cmH20] 27 cmH20  ABG    Component Value Date/Time   PHART 7.414 09/08/2020 0444   PCO2ART 32.8 09/08/2020 0444   PO2ART 98 09/08/2020 0444   HCO3 20.7 09/08/2020 0444   TCO2 22 09/08/2020 0444   ACIDBASEDEF 3.0 (H) 09/08/2020 0444   O2SAT 97.0 09/08/2020 0444    - Abd: Soft - Extremity: Cool  .Intake/Output      08/04 0701 08/05 0700 08/05 0701 08/06 0700   P.O.     I.V. (mL/kg) 2258.7 (35.7) 1082.3 (17.1)   NG/GT     IV Piggyback 619 319.7   Total Intake(mL/kg) 2877.7 (45.5) 1402 (22.2)   Urine (mL/kg/hr) 1200 (0.8) 560 (0.9)   Emesis/NG output 100    Drains 230 40   Blood     Chest Tube 150 50   Total Output 1680 650   Net +1197.7 +752           _______________________________________________________________ Labs: CBC Latest Ref Rng & Units 09/09/2020 09/08/2020 09/08/2020  WBC 4.0 - 10.5 K/uL 22.0(H) - 22.3(H)  Hemoglobin  13.0 - 17.0 g/dL 10.2(L) 10.9(L) 11.0(L)  Hematocrit 39.0 - 52.0 % 30.1(L) 32.0(L) 32.2(L)  Platelets 150 - 400 K/uL 292 - 347   CMP Latest Ref Rng & Units 09/09/2020 09/08/2020 09/08/2020  Glucose 70 - 99 mg/dL 11/08/2020) - 468(E)  BUN 6 - 20 mg/dL 10 - 18  Creatinine 321(Y - 1.24 mg/dL 2.48) - 2.50(I  Sodium 135 - 145 mmol/L 131(L) 135 130(L)  Potassium 3.5 - 5.1 mmol/L 3.4(L) 3.7 3.8  Chloride 98 - 111 mmol/L 99 - 100  CO2 22 - 32 mmol/L 25 - 19(L)  Calcium 8.9 - 10.3 mg/dL 7.8(L) - 8.2(L)  Total Protein 6.5 - 8.1 g/dL - - -  Total Bilirubin 0.3 - 1.2 mg/dL - - -  Alkaline Phos 38 - 126 U/L - - -  AST 15 - 41 U/L - - -  ALT 0 - 44 U/L - - -    CXR: No significant change  _______________________________________________________________  Assessment and Plan: POD 8 s/p robotic Ivor Lewis esophagectomy, postop day 2 status post reexploration with reinforcement of anastomosis.  Neuro: Wean sedation as tolerated CV: Wean Levophed as  tolerated Pulm: Wean vent as tolerated.  Appreciate pulmonary critical care. Renal: Creatinine stable. GI:  We will plan for TPN this is not done. Heme: Stable ID: On Vanco Zosyn and Diflucan Endo: Sliding scale insulin Dispo: Continue ICU care.   Corliss Skains 09/09/2020 5:23 PM

## 2020-09-09 NOTE — Progress Notes (Signed)
[facility] ADULT ICU REPLACEMENT PROTOCOL   The patient does apply for the [facility] Adult ICU Electrolyte Replacment Protocol based on the criteria listed below:   1.Exclusion criteria: TCTS patients, ECMO patients and Hypothermia Protocol, and   Dialysis patients 2. Is GFR >/= 30 ml/min? Yes.    Patient's GFR today is >60 3. Is SCr </= 2? Yes.   Patient's SCr is 0.57 mg/dL 4. Did SCr increase >/= 0.5 in 24 hours? No. 5.Pt's weight >40kg  Yes.   6. Abnormal electrolyte(s): K 3.4, Mg 1.7  7. Electrolytes replaced per protocol   Ardelle Park 09/09/2020 6:28 AM

## 2020-09-09 NOTE — Progress Notes (Signed)
5638 Patient tachypneic with RR in 50s, decreased O2 sats in 70s with increased WOB.  Kreg Shropshire, NP aware and at bedside.    1015- Patient intubated without difficulty, equal bilateral breath sounds auscultated.  O2 sats 100%  1020- Spouse updated on patient events via phone, questions answered, updated on plan of care.

## 2020-09-09 NOTE — Progress Notes (Signed)
Peripherally Inserted Central Catheter Placement  The IV Nurse has discussed with the patient and/or persons authorized to consent for the patient, the purpose of this procedure and the potential benefits and risks involved with this procedure.  The benefits include less needle sticks, lab draws from the catheter, and the patient may be discharged home with the catheter. Risks include, but not limited to, infection, bleeding, blood clot (thrombus formation), and puncture of an artery; nerve damage and irregular heartbeat and possibility to perform a PICC exchange if needed/ordered by physician.  Alternatives to this procedure were also discussed.  Bard Power PICC patient education guide, fact sheet on infection prevention and patient information card has been provided to patient /or left at bedside.  Consent obtained with wife via telephone   PICC Placement Documentation  PICC Triple Lumen 09/09/20 PICC Right Brachial 40 cm 0 cm (Active)  Indication for Insertion or Continuance of Line Administration of hyperosmolar/irritating solutions (i.e. TPN, Vancomycin, etc.) 09/09/20 1600  Exposed Catheter (cm) 0 cm 09/09/20 1600  Site Assessment Clean;Dry;Intact 09/09/20 1600  Lumen #1 Status Flushed;Saline locked;Blood return noted 09/09/20 1600  Lumen #2 Status Flushed;Saline locked;Blood return noted 09/09/20 1600  Lumen #3 Status Flushed;Saline locked;Blood return noted 09/09/20 1600  Dressing Type Transparent;Securing device 09/09/20 1600  Dressing Status Clean;Dry;Intact 09/09/20 1600  Antimicrobial disc in place? Yes 09/09/20 1600  Safety Lock Not Applicable 09/09/20 1600  Line Care Connections checked and tightened 09/09/20 1600  Dressing Intervention New dressing 09/09/20 1600  Dressing Change Due 09/16/20 09/09/20 1600       Franne Grip Renee 09/09/2020, 4:45 PM

## 2020-09-09 NOTE — Progress Notes (Signed)
Nutrition Follow-up  DOCUMENTATION CODES:   Underweight, Severe malnutrition in context of chronic illness  INTERVENTION:   - TPN per Pharmacy, titrate to meet 100% of estimated needs  Monitor magnesium, potassium, and phosphorus twice daily for at least 3 days, MD to replete as needed, as pt is at risk for refeeding syndrome given severe malnutrition, inadequate nutrition x 8 days.   Will monitor for ability to restart tube feeds via J-tube. Recommend: - Start Vital 1.5 @ 20 ml/hr and advance by 10 ml q 8 hours to goal rate of 60 ml/hr (1440 ml/day) - ProSource TF 45 ml daily  Recommended tube feeding regimen at goal would provide 2200 kcal, 108 grams of protein, and 1100 ml of H2O.   NUTRITION DIAGNOSIS:   Severe Malnutrition related to chronic illness (recurrent esophageal stricture s/p J-tube) as evidenced by severe muscle depletion, severe fat depletion.  Ongoing, being addressed via initiation of TPN  GOAL:   Patient will meet greater than or equal to 90% of their needs  Progressing with initiation of TPN  MONITOR:   Vent status, Labs, Weight trends, Skin, I & O's, Other (TPN)  REASON FOR ASSESSMENT:   Consult Enteral/tube feeding initiation and management  ASSESSMENT:   55 year old male who presented on 7/28 for esophagogastroduodenoscopy and robotic assisted esophagectomy. PMH of esophageal stricture s/p multiple dilations and J-tube placement in April 2022, stroke, CAD, MI, anxiety.  7/30 - tube feeds held due to drainage around J-tube 7/31 - tube feeds resumed at lower rate 8/01 - swallow study negative 8/02 - tube feeds held due to drainage around J-tube 8/03 - intubated, s/p esophagoscopy, thoracoscopy, decortication, and reinforcement of esophageal anastomosis 8/04 - s/p J-tube replacement in IR, tube feeds resumed 8/05 - tube feeds held due to drainage around new J-tube, extubated, later reintubated  Pt now with sepsis per CCM, currently on levophed.  Pt with NG tube in place. J-tube in place and currently clamped due to excessive leakage from around J-tube site. Tube feeds are being held.  Discussed pt with CCM and TCTS. Plan to start TPN today due to inadequate nutrition since admission (x 8 days) and severe malnutrition. Per IR note, pt's J-tube bumper does not stay in place. RN reports that Dr. Cliffton Asters is considering suturing around the J-tube. IR note states that they are able to upsize J-tube if leaking persists after suturing. Unsure when this will occur.  Spoke with RN. Pt extubated this morning but soon after required reintubation due to high work of breathing. RN reports pt is very weak.  Medications reviewed and include: SSI q 4 hours, IV reglan 10 mg q 6 hours, IV protonix, miralax, IV diflucan, IV abx, IV KCl 10 mEq x 4 runs, levophed gtt IVF: D5 in LR @ 75 ml/hr  Labs reviewed: sodium 131, potassium 3.4  UOP: 1200 ml x 24 hours NGT: 100 ml x 12 hours RLQ JP drain: 170 ml x 24 hours RUQ JP drain: 60 ml x 24 hours CT: 150 ml x 24 hours I/O's: +3.8 L since admit  Diet Order:   Diet Order             Diet NPO time specified  Diet effective now                   EDUCATION NEEDS:   No education needs have been identified at this time  Skin:  Skin Assessment: Skin Integrity Issues: DTI: sacrum Incisions: abdoment, R chest x 2  Last BM:  09/06/20 large type 7/type 3  Height:   Ht Readings from Last 1 Encounters:  09/07/20 5\' 8"  (1.727 m)    Weight:   Wt Readings from Last 1 Encounters:  09/09/20 63.2 kg    BMI:  Body mass index is 21.19 kg/m.  Estimated Nutritional Needs:   Kcal:  2100-2300  Protein:  100-120 grams  Fluid:  >/= 2.0 L    11/09/20, MS, RD, LDN Inpatient Clinical Dietitian Please see AMi[facility] for contact information.

## 2020-09-09 NOTE — Progress Notes (Addendum)
Patient is s/p J tube exchange with IR on 8/4 due to clogged J tube.   IR was contacted for re-evaluation of the J tube due to leakage after tube feed initiation.  Patient evaluated at the bedside, RN at the bedside. RN states that the bumper does not stay in place, tube feeding has to be stopped due to leakage.   Bumper cinched to skin, but the bumper slide spontaneously.  Bumper cinched to skin and taped to the skin.   RN states that  Dr. Cliffton Asters is considering purse string the tube.   If persistent leakage noted after purse string the tube, IR can offer upsizing the J tube under fluoroscopy  per Dr. Bryn Gulling.   Will delete IR RAD eval order.  Please call IR for questions and concerns regarding J tube.   Lynann Bologna Mihira Tozzi PA-C 09/09/2020 11:11 AM

## 2020-09-09 NOTE — Progress Notes (Signed)
PT Cancellation Note  Patient Details Name: PRATEEK KNIPPLE MRN: [id number] DOB: [birth year]   Cancelled Treatment:    Reason Eval/Treat Not Completed: Medical issues which prohibited therapy (pt with medical decline and reintubated, not currently appropriate for therapy)   Jodye Scali B Crews Mccollam 09/09/2020, 1:18 PM Merryl Hacker, PT Acute Rehabilitation Services Pager: [phone] Office: [phone]

## 2020-09-09 NOTE — Progress Notes (Addendum)
NAME:  Ralph [name], MRN:  [id number], DOB:  [birth year], LOS: 8 ADMISSION DATE:  09/01/2020, CONSULTATION DATE:  8/3 REFERRING MD:  Dr. Cliffton Asters, CHIEF COMPLAINT:  esophageal rupture esophageal stricture   History of Present Illness:  Patient is a 55 yo M w/ PMH anxiety, CAD, MI, stroke presents to [facility] on 7/28 for esophagectomy w/ Dr. Cliffton Asters for esophageal stricture.  On 7/28, Dr. Cliffton Asters performed the esophagectomy without complication.  8/3 patient taken back to OR for esophageal rupture s/p esophagectomy by Dr. Cliffton Asters. Unable to extubate post procedure. Patient remains intubated on mechanical ventilation. ABG with respiratory acidosis.   PCCM consulted for vent management.  Pertinent  Medical History   Past Medical History:  Diagnosis Date   Anxiety    Coronary artery disease    Myocardial infarction [facility])    Stroke (HCC)    Significant Hospital Events: Including procedures, antibiotic start and stop dates in addition to other pertinent events   7/28 admitted for planned esophagectomy. Chest tube was placed on right chest. J tube in place. 2 JP drains in place. Patient was extubated post procedure. Patient developed subcutaneous emphysema. Unable to use J tube due to leakage around site. 8/3: PCCM consulted for vent management after pt brought back to OR for esophageal rupture/ leak. Vanc/zosyn/fluconazole started. Hypotension noted post-op. Felt 2/2 Sepsis; treated w/ volume and pressors.  8/4: Still on norepinephrine.  Central venous pressure suggesting he is still hypovolemic so receiving fluid.  Spontaneous breathing trial initiated but not ready for extubation due to work of breathing.  culture sent from chest tube.  J-tube replaced in interventional radiology due to leaking.  Tube feeds resumed in the afternoon 8/5: J-tube site again leaking, tube feeds placed on hold around midnight.Still pressor dependent . Passed SBT. WOB improved.   Interim History / Subjective:   Still having discomfort at chest tube site.  Placed on pressure support ventilation, exhibiting some work of breathing, not in distress.  He feels better today breathing wise  Objective   Blood pressure 100/67, pulse (Abnormal) 58, temperature 98 F (36.7 C), temperature source Axillary, resp. rate (Abnormal) 30, height 5\' 8"  (1.727 m), weight 63.2 kg, SpO2 94 %. CVP:  [0 mmHg-15 mmHg] 4 mmHg  Vent Mode: PSV;CPAP FiO2 (%):  [40 %] 40 % Set Rate:  [15 bmp] 15 bmp Vt Set:  [540 mL] 540 mL PEEP:  [5 cmH20] 5 cmH20 Pressure Support:  [8 cmH20-10 cmH20] 8 cmH20 Plateau Pressure:  [18 cmH20-27 cmH20] 18 cmH20   Intake/Output Summary (Last 24 hours) at 09/09/2020 0758 Last data filed at 09/09/2020 11/09/2020 Gross per 24 hour  Intake 2846.14 ml  Output 1680 ml  Net 1166.14 ml   Filed Weights   09/07/20 1308 09/08/20 0426 09/09/20 0500  Weight: 61 kg 60.6 kg 63.2 kg    Examination:  General 55 year old white male he is currently on pressure support ventilation of 8, mildly tachypneic, but not in distress.  Continues to endorse discomfort at surgical site on the right HEENT temporal wasting, nasogastric tube at low intermittent suction.  Mucous membranes moist, orally intubated, right IJ triple-lumen catheter is in place site is unremarkable Pulmonary: Coarse bilateral breath sounds.  Currently on pressure support as mentioned above.  Tidal volume in the 450 range respiratory rate 29 to 30s, mild accessory use but overall looks improved  tubes: Right chest tube without air leak, charged JP drains noted.  Dressings are intact, continues to have cloudy pleural fluid from all  drainage looks like he is only put out about 20 mL out of his lateral chest tube. Cardiac: Regular rate and rhythm Abdomen: Soft.  J-tube in place.  Currently on hold with tube feeds Extremities: Warm dry brisk capillary refill next neuro: Awake, follows commands.  Continues to report pain  Resolved Hospital Problem list    AKI Septic shock Assessment & Plan:   Acute respiratory failure w/ hypoxia and hypercarbia Portable chest x-ray personally reviewed.  Tubes and lines are in satisfactory position.  Bilateral airspace disease again noted.  Looks fairly similar in comparison to prior exam. -Currently on pressure support but exhibiting some accessory use Plan Extubate Wean oxygen N.p.o. Pulse ox Spirometry Keep ventilator at bedside, not a candidate for BiPAP   Sepsis 2/2 Esophageal rupture s/p Esophagectomy Drug-related hypotension.  -I think for the most part his SIRS response is improved.  Currently it seems as though his hypotension is Precedex related, we appear to be meeting his and organ perfusion requirements Plan Continue maintenance IV fluids  Continue to titrate norepinephrine for mean arterial pressure greater than 65 chest tube management per cardiothoracic team  Continue broad-spectrum antibiotics, day #2 vancomycin, Zosyn, and fluconazole  Severe protein calorie malnutrition.  Further complicated by leaking J-tube. -not sure why this continues to be an issue.  Plan Holding TFs Will ask IR to re-eval.  Might need to consider TPN  Fluid and electrolyte imbalance: Hyponatremia, hypokalemia, hypomagnesemia. Plan Replace and recheck  H/o CAD Plan Continue telemetry monitoring  Mild anemia  Plan Trend CBC   Best Practice (right click and "Reselect all SmartList Selections" daily)   Diet/type: NPO DVT prophylaxis: LMWH GI prophylaxis: PPI Lines: Central line Foley:  Yes, and it is still needed Code Status:  full code Last date of multidisciplinary goals of care discussion [per primary]   Critical care time: 31 min    Ralph [name] ACNP-BC [facility] Pulmonary/Critical Care Pager # [phone] OR # [phone] if no answer   PCCM:  55 yo M, PMH anxiety, CAD, MI, stroke.  Presented for esophagectomy.  Patient's postoperative course complicated by a leak.  Had to be take  back to the operating room.  Was brought back to the ICU on mechanical vent support.  Overall very deconditioned.  Attempted extubation.  Patient failed extubation today required reintubation.  Please see separate documentation.  BP 105/68   Pulse 72   Temp 98 F (36.7 C) (Axillary)   Resp 15   Ht 5\' 8"  (1.727 m)   Wt 63.2 kg   SpO2 99%   BMI 21.19 kg/m   General: Frail debilitated gentleman intubated on mechanical life support HEENT: Endotracheal tube in place, tracking appropriately Heart: Regular rhythm S1-S2 Lungs: Bilateral mechanically ventilated breath sounds Extremities: No significant edema  Labs: Reviewed  Assessment: Acute hypoxemic respiratory failure requiring intubation mechanical ventilation Sepsis, likely polymicrobial Esophageal rupture status post esophagectomy Severe protein calorie malnutrition, J-tube continually leaking Electrolyte imbalance  Plan: Unfortunately patient was reintubated. Remains on full mechanical vent support Remains on antibiotics. Continue tube feeding. Discussed case with thoracic surgery.  Unfortunately he is very weak.  This patient is critically ill with multiple organ system failure; which, requires frequent high complexity decision making, assessment, support, evaluation, and titration of therapies. This was completed through the application of advanced monitoring technologies and extensive interpretation of multiple databases. During this encounter critical care time was devoted to patient care services described in this note for 32 minutes.     Jamyah Folk, DO [place] [facility] 09/09/2020 10:54 AM

## 2020-09-09 NOTE — Progress Notes (Signed)
PHARMACY - TOTAL PARENTERAL NUTRITION CONSULT NOTE   Indication:  intolerance to enteral feeding  Patient Measurements: Height: 5\' 8"  (172.7 cm) Weight: 63.2 kg (139 lb 5.3 oz) IBW/kg (Calculated) : 68.4 TPN AdjBW (KG): 61 Body mass index is 21.19 kg/m. Usual Weight: 52 kg  Assessment:  54 yom with severe malnutrition and hx benign esophageal stricture with J-tube who is s/p planned robotic Ivor Lewis esophagectomy on 7/28. TF were held due to drainage around J-tube on 7/30, resumed at lower rate on 7/31. TF held again on 8/2 for continued drainage around J-tube. He was taken back to the OR on 8/3 for revision of anastomotic leak. J-tube replaced in IR on 8/4 but continues to have drainage. Significant refeeding risk noted. Pharmacy consulted to begin TPN.   Glucose / Insulin: no hx DM, CBGs 72-161 on SSI, 6 units used in last 24h Electrolytes: Na 131, K 3.4 - 4 runs ordered by MD, Mg 1.7 - 2 g given, Phos 2.7, Ca 7.8 uncorrected Renal: SCr <1 Hepatic: LFT / Tbili WNL, TG/albumin/prealbumin pending Intake / Output; MIVF: UOP 0.8 ml/kg/hr, D5LR at 75 ml/hr, NGT O/P 100 ml, drain O/P 230 ml, CT O/P 150 ml  GI Imaging: 8/3 CT abd/pelvis: small gas-filled collection on the left side of the mediastinum probably represents a small pocket at the esophageal-gastric anastomosis, large amount of pneumoperitoneum GI Surgeries / Procedures:  8/4 J-tube replaced in IR  Central access: double lumen CVC 09/01/20 TPN start date: 09/09/20  Nutritional Goals (per RD recommendation on 8/5): kCal: 2100-2300, Protein: 100-120 g, Fluid: >= 2 L Goal TPN rate is 95 mL/hr (provides 112 g of protein and 2216 kcals per day)  Current Nutrition:  NPO and TPN  Plan:  Start TPN at 16mL/hr at 1800 Electrolytes in TPN: Na 50mEq/L, K 80mEq/L, Ca 92mEq/L, Mg 55mEq/L, and Phos 16mmol/L. Cl:Ac 1:1 Add standard MVI and trace elements to TPN Continue TCTS SSI q4h SSI and adjust as needed  Reduce MIVF to 45 mL/hr at  1800 Monitor TPN labs on Mon/Thurs, labs in am  Thank you for involving pharmacy in this patient's care.  12m, PharmD, BCPS Clinical Pharmacist Clinical phone for 09/09/2020 until 3p is 11/09/2020 09/09/2020 9:43 AM  **Pharmacist phone directory can be found on [email or web address] listed under [facility]**

## 2020-09-09 NOTE — Procedures (Signed)
Intubation Procedure Note  Ralph [name]  [id number]  [birth year]  Date:09/09/20  Time:10:21 AM   Provider Performing:Pete E Tanja Port    Procedure: Intubation (31500)  Indication(s) Respiratory Failure  Consent Unable to obtain consent due to emergent nature of procedure.   Anesthesia Etomidate, Fentanyl, and Rocuronium   Time Out Verified patient identification, verified procedure, site/side was marked, verified correct patient position, special equipment/implants available, medications/allergies/relevant history reviewed, required imaging and test results available.   Sterile Technique Usual hand hygeine, masks, and gloves were used   Procedure Description Patient positioned in bed supine.  Sedation given as noted above.  Patient was intubated with endotracheal tube using Glidescope.  View was Grade 1 full glottis .  Number of attempts was 1.  Colorimetric CO2 detector was consistent with tracheal placement.   Complications/Tolerance None; patient tolerated the procedure well. Chest X-ray is ordered to verify placement.   EBL Minimal   Specimen(s) None  [name] ACNP-BC [facility] Pulmonary/Critical Care Pager # [phone] OR # [phone] if no answer

## 2020-09-09 NOTE — Procedures (Signed)
Extubation Procedure Note  Patient Details:   Name: Ralph [name] DOB: 1965-12-18 MRN: [id number]   Airway Documentation:    Vent end date: 09/09/20 Vent end time: 0910   Evaluation  O2 sats: stable throughout Complications: No apparent complications Patient did tolerate procedure well. Bilateral Breath Sounds: Clear, Diminished   Pt extubated to 5L [place] per MD order. Pt had positive cuff leak prior to extubation. No stridor noted. RT to continue to monitor.  Guss Bunde 09/09/2020, 9:11 AM

## 2020-09-09 NOTE — Plan of Care (Signed)
  Problem: Education: Goal: Knowledge of the prescribed therapeutic regimen will improve Outcome: Progressing   Problem: Bowel/Gastric: Goal: Gastrointestinal status for postoperative course will improve Outcome: Progressing   Problem: Nutritional: Goal: Ability to achieve adequate nutritional intake will improve Outcome: Progressing   Problem: Clinical Measurements: Goal: Postoperative complications will be avoided or minimized Outcome: Progressing   Problem: Respiratory: Goal: Ability to maintain a clear airway will improve Outcome: Progressing   

## 2020-09-09 NOTE — Progress Notes (Signed)
Pt became restless/agitated, pulling at ETT despite recent bolus of fentanyl. Pt now maxed out on Fentanyl and Precedex. Pt still agitated, SBP jumped to 180s. Got verbal orders for soft wrist restraints and versed prn. Restraints started, spouse called and updated.

## 2020-09-09 NOTE — Progress Notes (Signed)
Called emergently to the room.  He was extubated earlier today.  Unfortunately work of breathing quite high, cough mechanics poor, exhibiting fairly significant delirium, desaturating.  Not a good candidate for noninvasive positive pressure ventilation so reintubated urgently at the bedside Plan Resume full ventilatory support PAD protocol, RASS goal -2 VAP bundle Rest of care as outlined in earlier progress note   [name] ACNP-BC [facility] Pulmonary/Critical Care Pager # [phone] OR # [phone] if no answer

## 2020-09-10 ENCOUNTER — Inpatient Hospital Stay (HOSPITAL_COMMUNITY): Payer: Medicaid Other

## 2020-09-10 DIAGNOSIS — Z978 Presence of other specified devices: Secondary | ICD-10-CM | POA: Diagnosis not present

## 2020-09-10 DIAGNOSIS — K9413 Enterostomy malfunction: Secondary | ICD-10-CM | POA: Diagnosis not present

## 2020-09-10 DIAGNOSIS — K9189 Other postprocedural complications and disorders of digestive system: Secondary | ICD-10-CM | POA: Diagnosis not present

## 2020-09-10 LAB — DIFFERENTIAL
Abs Immature Granulocytes: 0.2 10*3/uL — ABNORMAL HIGH (ref 0.00–0.07)
Basophils Absolute: 0 10*3/uL (ref 0.0–0.1)
Basophils Relative: 0 %
Eosinophils Absolute: 0.1 10*3/uL (ref 0.0–0.5)
Eosinophils Relative: 0 %
Immature Granulocytes: 1 %
Lymphocytes Relative: 5 %
Lymphs Abs: 0.7 10*3/uL (ref 0.7–4.0)
Monocytes Absolute: 0.7 10*3/uL (ref 0.1–1.0)
Monocytes Relative: 4 %
Neutro Abs: 13.9 10*3/uL — ABNORMAL HIGH (ref 1.7–7.7)
Neutrophils Relative %: 90 %

## 2020-09-10 LAB — GLUCOSE, CAPILLARY
Glucose-Capillary: 113 mg/dL — ABNORMAL HIGH (ref 70–99)
Glucose-Capillary: 116 mg/dL — ABNORMAL HIGH (ref 70–99)
Glucose-Capillary: 117 mg/dL — ABNORMAL HIGH (ref 70–99)
Glucose-Capillary: 135 mg/dL — ABNORMAL HIGH (ref 70–99)
Glucose-Capillary: 135 mg/dL — ABNORMAL HIGH (ref 70–99)
Glucose-Capillary: 149 mg/dL — ABNORMAL HIGH (ref 70–99)

## 2020-09-10 LAB — CBC
HCT: 29.7 % — ABNORMAL LOW (ref 39.0–52.0)
Hemoglobin: 9.9 g/dL — ABNORMAL LOW (ref 13.0–17.0)
MCH: 30.8 pg (ref 26.0–34.0)
MCHC: 33.3 g/dL (ref 30.0–36.0)
MCV: 92.5 fL (ref 80.0–100.0)
Platelets: 241 10*3/uL (ref 150–400)
RBC: 3.21 MIL/uL — ABNORMAL LOW (ref 4.22–5.81)
RDW: 15.1 % (ref 11.5–15.5)
WBC: 15.1 10*3/uL — ABNORMAL HIGH (ref 4.0–10.5)
nRBC: 0 % (ref 0.0–0.2)

## 2020-09-10 LAB — COMPREHENSIVE METABOLIC PANEL
ALT: 15 U/L (ref 0–44)
AST: 25 U/L (ref 15–41)
Albumin: 1.5 g/dL — ABNORMAL LOW (ref 3.5–5.0)
Alkaline Phosphatase: 52 U/L (ref 38–126)
Anion gap: 5 (ref 5–15)
BUN: 6 mg/dL (ref 6–20)
CO2: 28 mmol/L (ref 22–32)
Calcium: 7.8 mg/dL — ABNORMAL LOW (ref 8.9–10.3)
Chloride: 98 mmol/L (ref 98–111)
Creatinine, Ser: 0.57 mg/dL — ABNORMAL LOW (ref 0.61–1.24)
GFR, Estimated: >60 mL/min (ref >60)
Glucose, Bld: 128 mg/dL — ABNORMAL HIGH (ref 70–99)
Potassium: 3.1 mmol/L — ABNORMAL LOW (ref 3.5–5.1)
Sodium: 131 mmol/L — ABNORMAL LOW (ref 135–145)
Total Bilirubin: 1 mg/dL (ref 0.3–1.2)
Total Protein: 4 g/dL — ABNORMAL LOW (ref 6.5–8.1)

## 2020-09-10 LAB — MAGNESIUM: Magnesium: 1.4 mg/dL — ABNORMAL LOW (ref 1.7–2.4)

## 2020-09-10 LAB — TRIGLYCERIDES: Triglycerides: 61 mg/dL (ref <150)

## 2020-09-10 LAB — VANCOMYCIN, TROUGH: Vancomycin Tr: 4 ug/mL — ABNORMAL LOW (ref 15–20)

## 2020-09-10 LAB — PREALBUMIN: Prealbumin: <5 mg/dL — ABNORMAL LOW (ref 18–38)

## 2020-09-10 LAB — PHOSPHORUS: Phosphorus: 3 mg/dL (ref 2.5–4.6)

## 2020-09-10 MED ORDER — ACETAMINOPHEN 160 MG/5ML PO SOLN
650.0000 mg | Freq: Four times a day (QID) | ORAL | Status: DC | PRN
  Filled 2020-09-10: qty 20.3

## 2020-09-10 MED ORDER — SODIUM CHLORIDE 0.9 % IV SOLN
0.5000 mg/h | INTRAVENOUS | Status: DC
  Administered 2020-09-10: 1 mg/h via INTRAVENOUS
  Administered 2020-09-11: 2 mg/h via INTRAVENOUS
  Administered 2020-09-12: 5.5 mg/h via INTRAVENOUS
  Administered 2020-09-12: 3 mg/h via INTRAVENOUS
  Administered 2020-09-13 – 2020-09-14 (×3): 6 mg/h via INTRAVENOUS
  Administered 2020-09-14: 5 mg/h via INTRAVENOUS
  Filled 2020-09-10 (×13): qty 5

## 2020-09-10 MED ORDER — HYDROMORPHONE BOLUS VIA INFUSION
0.2500 mg | INTRAVENOUS | Status: DC | PRN
  Administered 2020-09-10 (×2): 0.75 mg via INTRAVENOUS
  Administered 2020-09-12: 2 mg via INTRAVENOUS
  Administered 2020-09-13: 1 mg via INTRAVENOUS
  Administered 2020-09-13: 2 mg via INTRAVENOUS
  Administered 2020-09-13: 1 mg via INTRAVENOUS
  Administered 2020-09-13: 0.5 mg via INTRAVENOUS
  Administered 2020-09-14: 1 mg via INTRAVENOUS
  Administered 2020-09-14: 2 mg via INTRAVENOUS
  Administered 2020-09-15: 1 mg via INTRAVENOUS
  Filled 2020-09-10: qty 2

## 2020-09-10 MED ORDER — DEXTROSE IN LACTATED RINGERS 5 % IV SOLN: INTRAVENOUS | Status: DC

## 2020-09-10 MED ORDER — HYDROMORPHONE HCL 1 MG/ML IJ SOLN
1.0000 mg | Freq: Once | INTRAMUSCULAR | Status: AC
  Administered 2020-09-10: 1 mg via INTRAVENOUS
  Filled 2020-09-10: qty 1

## 2020-09-10 MED ORDER — POTASSIUM CHLORIDE 10 MEQ/50ML IV SOLN
10.0000 meq | INTRAVENOUS | Status: AC
  Administered 2020-09-10 (×6): 10 meq via INTRAVENOUS
  Filled 2020-09-10 (×6): qty 50

## 2020-09-10 MED ORDER — FAT EMUL FISH OIL/PLANT BASED 20% (SMOFLIPID)IV EMUL
INTRAVENOUS | Status: AC
  Filled 2020-09-10: qty 588

## 2020-09-10 MED ORDER — MAGNESIUM SULFATE 4 GM/100ML IV SOLN
4.0000 g | Freq: Once | INTRAVENOUS | Status: AC
  Administered 2020-09-10: 4 g via INTRAVENOUS
  Filled 2020-09-10: qty 100

## 2020-09-10 MED ORDER — VANCOMYCIN HCL 750 MG/150ML IV SOLN
750.0000 mg | Freq: Three times a day (TID) | INTRAVENOUS | Status: DC
  Administered 2020-09-11 (×4): 750 mg via INTRAVENOUS
  Filled 2020-09-10 (×7): qty 150

## 2020-09-10 MED ORDER — ACETAMINOPHEN 10 MG/ML IV SOLN
1000.0000 mg | Freq: Four times a day (QID) | INTRAVENOUS | Status: AC | PRN
  Administered 2020-09-10: 1000 mg via INTRAVENOUS
  Filled 2020-09-10: qty 100

## 2020-09-10 NOTE — Plan of Care (Signed)
  Problem: Education: Goal: Knowledge of the prescribed therapeutic regimen will improve Outcome: Progressing   Problem: Bowel/Gastric: Goal: Gastrointestinal status for postoperative course will improve Outcome: Progressing   Problem: Nutritional: Goal: Ability to achieve adequate nutritional intake will improve Outcome: Progressing   Problem: Clinical Measurements: Goal: Postoperative complications will be avoided or minimized Outcome: Progressing   Problem: Respiratory: Goal: Ability to maintain a clear airway will improve Outcome: Progressing   Problem: Skin Integrity: Goal: Demonstration of wound healing without infection will improve Outcome: Progressing   

## 2020-09-10 NOTE — Plan of Care (Signed)
  Problem: Nutrition: Goal: Adequate nutrition will be maintained Outcome: Progressing   Problem: Coping: Goal: Level of anxiety will decrease Outcome: Progressing   Problem: Pain Managment: Goal: General experience of comfort will improve Outcome: Progressing   Problem: Safety: Goal: Non-violent Restraint(s) Outcome: Progressing

## 2020-09-10 NOTE — Progress Notes (Signed)
PHARMACY - TOTAL PARENTERAL NUTRITION CONSULT NOTE   Indication:  intolerance to enteral feeding  Patient Measurements: Height: 5\' 8"  (172.7 cm) Weight: 60.1 kg (132 lb 7.9 oz) IBW/kg (Calculated) : 68.4 TPN AdjBW (KG): 61 Body mass index is 20.15 kg/m. Usual Weight: 52 kg  Assessment:  54 yom with severe malnutrition and hx benign esophageal stricture with J-tube who is s/p planned robotic Ivor Lewis esophagectomy on 7/28. TF were held due to drainage around J-tube on 7/30, resumed at lower rate on 7/31. TF held again on 8/2 for continued drainage around J-tube. He was taken back to the OR on 8/3 for revision of anastomotic leak. J-tube replaced in IR on 8/4 but continues to have drainage. Significant refeeding risk noted. Pharmacy consulted to begin TPN.   Glucose / Insulin: no hx DM, CBGs controlled on SSI, 6 units used in last 24h Electrolytes: Na 131, K 3.1 - 4 runs ordered by MD, Mg 1.4, Phos 3, CoCa 9.8  Renal: SCr <1 Hepatic: LFTs / Tbili / TG WNL, albumin 1.5, prealbumin <5 Intake / Output; MIVF: UOP 1.3 ml/kg/hr, D5LR at 45 ml/hr, NGT O/P 75 ml, drain O/P 150 ml, chest tube O/P 100 ml, net +5.6 L  GI Imaging: 8/3 CT abd/pelvis: small gas-filled collection on the left side of the mediastinum probably represents a small pocket at the esophageal-gastric anastomosis, large amount of pneumoperitoneum GI Surgeries / Procedures:  8/4 J-tube replaced in IR  Central access: double lumen CVC 09/01/20, triple lumen PICC 8/5 TPN start date: 09/09/20  Nutritional Goals (per RD recommendation on 8/5): kCal: 2100-2300, Protein: 100-120 g, Fluid: >= 2 L Goal TPN rate is 95 mL/hr (provides 112 g of protein and 2216 kcals per day)  Current Nutrition:  NPO and TPN  Plan:  Increase TPN to 9mL/hr at 1800 Electrolytes in TPN: Na 25mEq/L, K 53mEq/L, Ca 47mEq/L, Mg 60mEq/L, and Phos 68mmol/L. Change Cl:Ac 2:1. Lytes increasing with rate increase This TPN provides 59 g of protein, 168 g of  dextrose, and 36 g of lipids for a total of 1166 kcals meeting 55% of patient needs Add standard MVI and trace elements to TPN Continue TCTS SSI q4h SSI and adjust as needed  Reduce MIVF to 25 mL/hr at 1800 Monitor TPN labs on Mon/Thurs, labs in am  KCl 10 meq IV q1h x6 per MD Mg 4 g IV x1  Thank you for involving pharmacy in this patient's care.  12m, PharmD, BCPS Clinical Pharmacist Clinical phone for 09/10/2020 until 3p is 11/10/2020 09/10/2020 7:04 AM  **Pharmacist phone directory can be found on [email or web address] listed under [facility]**

## 2020-09-10 NOTE — Progress Notes (Signed)
      [address]       [place] [address]             [phone]                 3 Days Post-Op Procedure(s) (LRB): XI ROBOTIC ASSISTED THORASCOPY-DECORTICATION & REVISION OF ESOPHAGECTOMY (Right) ESOPHAGOGASTRODUODENOSCOPY (EGD) (N/A) INTERCOSTAL NERVE BLOCK (Right)   Events: No events. _______________________________________________________________ Vitals: BP 94/68   Pulse 67   Temp (!) 102.1 F (38.9 C) (Oral)   Resp 17   Ht 5\' 8"  (1.727 m)   Wt 60.1 kg   SpO2 94%   BMI 20.15 kg/m   - Neuro: sedated  - Cardiovascular: Sinus  Drips: Levophed at 10.   CVP:  [0 mmHg-15 mmHg] 6 mmHg  - Pulm:  Vent Mode: PRVC FiO2 (%):  [40 %-80 %] 40 % Set Rate:  [15 bmp] 15 bmp Vt Set:  [540 mL] 540 mL PEEP:  [5 cmH20] 5 cmH20 Plateau Pressure:  [21 cmH20-27 cmH20] 24 cmH20  ABG    Component Value Date/Time   PHART 7.414 09/08/2020 0444   PCO2ART 32.8 09/08/2020 0444   PO2ART 98 09/08/2020 0444   HCO3 20.7 09/08/2020 0444   TCO2 22 09/08/2020 0444   ACIDBASEDEF 3.0 (H) 09/08/2020 0444   O2SAT 97.0 09/08/2020 0444    - Abd: Soft - Extremity: Cool  .Intake/Output      08/05 0701 08/06 0700 08/06 0701 08/07 0700   I.V. (mL/kg) 3242.4 (54)    IV Piggyback 910.4    Total Intake(mL/kg) 4152.8 (69.1)    Urine (mL/kg/hr) 1917 (1.3) 150 (1.5)   Emesis/NG output 75    Drains 150 20   Chest Tube 100 40   Total Output 2242 210   Net +1910.8 -210           _______________________________________________________________ Labs: CBC Latest Ref Rng & Units 09/10/2020 09/09/2020 09/08/2020  WBC 4.0 - 10.5 K/uL 15.1(H) 22.0(H) -  Hemoglobin 13.0 - 17.0 g/dL 11/08/2020) 10.2(L) 10.9(L)  Hematocrit 39.0 - 52.0 % 29.7(L) 30.1(L) 32.0(L)  Platelets 150 - 400 K/uL 241 292 -   CMP Latest Ref Rng & Units 09/10/2020 09/09/2020 09/08/2020  Glucose 70 - 99 mg/dL 11/08/2020) 756(E) -  BUN 6 - 20 mg/dL 6 10 -  Creatinine 332(R - 1.24 mg/dL 5.18) 8.41(Y) -  Sodium 135 - 145 mmol/L  131(L) 131(L) 135  Potassium 3.5 - 5.1 mmol/L 3.1(L) 3.4(L) 3.7  Chloride 98 - 111 mmol/L 98 99 -  CO2 22 - 32 mmol/L 28 25 -  Calcium 8.9 - 10.3 mg/dL 7.8(L) 7.8(L) -  Total Protein 6.5 - 8.1 g/dL 4.0(L) - -  Total Bilirubin 0.3 - 1.2 mg/dL 1.0 - -  Alkaline Phos 38 - 126 U/L 52 - -  AST 15 - 41 U/L 25 - -  ALT 0 - 44 U/L 15 - -    CXR: No significant change  _______________________________________________________________  Assessment and Plan: POD 9 s/p robotic Ivor Lewis esophagectomy, postop day 3 status post reexploration with reinforcement of anastomosis.  Neuro: Wean sedation as tolerated CV: Wean Levophed as tolerated Pulm: Wean vent as tolerated.  Appreciate pulmonary critical care. Renal: Creatinine stable. GI:  On TPN.  Will see about upsizing drain next week Heme: Stable ID: On Vanco Zosyn and Diflucan day 3 Endo: Sliding scale insulin Dispo: Continue ICU care.   6.06(T 09/10/2020 8:41 AM

## 2020-09-10 NOTE — Progress Notes (Signed)
NAME:  Ralph [name], MRN:  [id number], DOB:  [birth year], LOS: 9 ADMISSION DATE:  09/01/2020, CONSULTATION DATE:  8/3 REFERRING MD:  Dr. Cliffton Asters, CHIEF COMPLAINT:  esophageal rupture esophageal stricture   History of Present Illness:  Patient is a 55 yo M w/ PMH anxiety, CAD, MI, stroke presents to [facility] on 7/28 for esophagectomy w/ Dr. Cliffton Asters for esophageal stricture.  On 7/28, Dr. Cliffton Asters performed the esophagectomy without complication.  8/3 patient taken back to OR for esophageal rupture s/p esophagectomy by Dr. Cliffton Asters. Unable to extubate post procedure. Patient remains intubated on mechanical ventilation. ABG with respiratory acidosis.   PCCM consulted for vent management.  Pertinent  Medical History   Past Medical History:  Diagnosis Date   Anxiety    Coronary artery disease    Myocardial infarction [facility])    Stroke (HCC)    Significant Hospital Events: Including procedures, antibiotic start and stop dates in addition to other pertinent events   7/28 admitted for planned esophagectomy. Chest tube was placed on right chest. J tube in place. 2 JP drains in place. Patient was extubated post procedure. Patient developed subcutaneous emphysema. Unable to use J tube due to leakage around site. 8/3: PCCM consulted for vent management after pt brought back to OR for esophageal rupture/ leak. Vanc/zosyn/fluconazole started. Hypotension noted post-op. Felt 2/2 Sepsis; treated w/ volume and pressors.  8/4: Still on norepinephrine.  Central venous pressure suggesting he is still hypovolemic so receiving fluid.  Spontaneous breathing trial initiated but not ready for extubation due to work of breathing.  culture sent from chest tube.  J-tube replaced in interventional radiology due to leaking.  Tube feeds resumed in the afternoon 8/5: J-tube site again leaking, tube feeds placed on hold around midnight.Still pressor dependent . Passed SBT. WOB improved.  Failed extubation and was  reintubated  Interim History / Subjective:   Critically ill intubated on mechanical life support.  Objective   Blood pressure 97/65, pulse 66, temperature 98.8 F (37.1 C), temperature source Axillary, resp. rate 17, height 5\' 8"  (1.727 m), weight 60.1 kg, SpO2 93 %. CVP:  [0 mmHg-15 mmHg] 6 mmHg  Vent Mode: PRVC FiO2 (%):  [40 %-80 %] 40 % Set Rate:  [15 bmp] 15 bmp Vt Set:  [540 mL] 540 mL PEEP:  [5 cmH20] 5 cmH20 Pressure Support:  [8 cmH20] 8 cmH20 Plateau Pressure:  [21 cmH20-27 cmH20] 21 cmH20   Intake/Output Summary (Last 24 hours) at 09/10/2020 11/10/2020 Last data filed at 09/10/2020 0700 Gross per 24 hour  Intake 4152.8 ml  Output 2242 ml  Net 1910.8 ml   Filed Weights   09/08/20 0426 09/09/20 0500 09/10/20 0424  Weight: 60.6 kg 63.2 kg 60.1 kg    Examination:  General: Chronically ill-appearing HEENT: NCAT, endotracheal tube in place Pulmonary: Bilateral mechanically Breath sounds Cardiac: Regular rate rhythm, S1-S2 Abdomen: Soft, J-tube in place Extremities: No significant edema Neuro: Sedated on mechanical support.  Resolved Hospital Problem list   AKI Septic shock Assessment & Plan:   Acute respiratory failure w/ hypoxia and hypercarbia Portable chest x-ray personally reviewed.  Tubes and lines are in satisfactory position.  Bilateral airspace disease again noted.  Looks fairly similar in comparison to prior exam. -Currently on pressure support but exhibiting some accessory use Plan Trial of extubation yesterday, patient failed was reintubated. Remains on adult mechanical vent support. Continue to wean PEEP and FiO2 as tolerated Remains n.p.o. per thoracic surgery  Sepsis 2/2 Esophageal rupture s/p Esophagectomy Drug-related  hypotension.  Plan: Remains on continuous sedation Continue to titrate norepinephrine to maintain mean arterial pressure greater than 65 Remains on broad-spectrum antibiotics Vanco Zosyn plus fluconazole.  Severe protein calorie  malnutrition.  Further complicated by leaking J-tube. -not sure why this continues to be an issue.  Plan Continue tube feeds At some point may need to consider TPN.  If we are unable to meet nutritional needs.  Fluid and electrolyte imbalance: Hyponatremia, hypokalemia, hypomagnesemia. Plan Follow electrolytes replace and recheck as needed.  H/o CAD Plan Continue telemetry  Mild anemia  Plan Follow CBC.   Best Practice (right click and "Reselect all SmartList Selections" daily)   Diet/type: NPO DVT prophylaxis: LMWH GI prophylaxis: PPI Lines: Central line Foley:  Yes, and it is still needed Code Status:  full code Last date of multidisciplinary goals of care discussion [per primary]  This patient is critically ill with multiple organ system failure; which, requires frequent high complexity decision making, assessment, support, evaluation, and titration of therapies. This was completed through the application of advanced monitoring technologies and extensive interpretation of multiple databases. During this encounter critical care time was devoted to patient care services described in this note for 33 minutes.  Josephine Igo, DO [facility] 09/10/2020 7:28 AM

## 2020-09-10 NOTE — Progress Notes (Signed)
eLink Physician-Brief Progress Note Patient Name: ALECXANDER MAINWARING DOB: 1965-12-02 MRN: [id number]   Date of Service  09/10/2020  HPI/Events of Note  RN requests renewal of restraints (soft b/l wrist) in this intubated patient to maintain patient safety.   eICU Interventions  Order renewed.     Intervention Category Minor Interventions: Agitation / anxiety - evaluation and management  Marveen Reeks Warren Lindahl 09/10/2020, 9:08 PM

## 2020-09-10 NOTE — Progress Notes (Signed)
Pt AM K+ 3.1 with creat 0.57 and GFR > 60. CCM ELink electrolyte protocol initiated.

## 2020-09-10 NOTE — Progress Notes (Signed)
Pharmacy Antibiotic Note  Ralph [name] is a 55 y.o. male admitted on 09/01/2020 for planned esophagectomy. Post-op with bilious drainage around J-tube and increased NGT output - back to OR 8/3 for endoscopy and re-exploration with findings of esophageal anastomotic leak. Pharmacy has been consulted for Vancomycin + Zosyn + Fluconazole dosing.  A vancomycin trough drawn today was subtherapeutic.   Plan: - Increase Vancomycin to 750 mg IV every 8 hours (eAUC ~516, SCr 1.03, Vd 0.72) - Zosyn 3.375g IV x 1 now followed by 3.375g EI every 8 hours - Fluconazole 400 mg IV every 24 hours -Recheck vancomycin levels at steady state   Height: 5\' 8"  (172.7 cm) Weight: 60.1 kg (132 lb 7.9 oz) IBW/kg (Calculated) : 68.4  Temp (24hrs), Avg:99.1 F (37.3 C), Min:97.8 F (36.6 C), Max:102.1 F (38.9 C)  Recent Labs  Lab 09/06/20 0455 09/07/20 0219 09/08/20 0350 09/09/20 0409 09/10/20 0332 09/10/20 2106  WBC 12.7* 16.8* 22.3* 22.0* 15.1*  --   CREATININE 0.76 1.03 0.73 0.57* 0.57*  --   VANCOTROUGH  --   --   --   --   --  4*     Estimated Creatinine Clearance: 91.8 mL/min (A) (by C-G formula based on SCr of 0.57 mg/dL (L)).    No Known Allergies  Antimicrobials this admission: Vanc 8/3 >> Zosyn 8/3 >> Fluconazole 8/3 >>  Dose adjustments this admission:   Microbiology results: 8/3 Tissue cx >>  Thank you for allowing pharmacy to be a part of this patient's care.  10/3, PharmD., BCPS, BCCCP Clinical Pharmacist Please refer to [facility] for unit-specific pharmacist

## 2020-09-11 DIAGNOSIS — Z978 Presence of other specified devices: Secondary | ICD-10-CM | POA: Diagnosis not present

## 2020-09-11 DIAGNOSIS — K9189 Other postprocedural complications and disorders of digestive system: Secondary | ICD-10-CM | POA: Diagnosis not present

## 2020-09-11 LAB — BODY FLUID CULTURE W GRAM STAIN

## 2020-09-11 LAB — CBC
HCT: 30.3 % — ABNORMAL LOW (ref 39.0–52.0)
Hemoglobin: 10.1 g/dL — ABNORMAL LOW (ref 13.0–17.0)
MCH: 30.6 pg (ref 26.0–34.0)
MCHC: 33.3 g/dL (ref 30.0–36.0)
MCV: 91.8 fL (ref 80.0–100.0)
Platelets: 198 10*3/uL (ref 150–400)
RBC: 3.3 MIL/uL — ABNORMAL LOW (ref 4.22–5.81)
RDW: 15.1 % (ref 11.5–15.5)
WBC: 16.9 10*3/uL — ABNORMAL HIGH (ref 4.0–10.5)
nRBC: 0 % (ref 0.0–0.2)

## 2020-09-11 LAB — GLUCOSE, CAPILLARY
Glucose-Capillary: 104 mg/dL — ABNORMAL HIGH (ref 70–99)
Glucose-Capillary: 108 mg/dL — ABNORMAL HIGH (ref 70–99)
Glucose-Capillary: 119 mg/dL — ABNORMAL HIGH (ref 70–99)
Glucose-Capillary: 120 mg/dL — ABNORMAL HIGH (ref 70–99)
Glucose-Capillary: 136 mg/dL — ABNORMAL HIGH (ref 70–99)
Glucose-Capillary: 139 mg/dL — ABNORMAL HIGH (ref 70–99)

## 2020-09-11 LAB — BASIC METABOLIC PANEL
Anion gap: 5 (ref 5–15)
BUN: 6 mg/dL (ref 6–20)
CO2: 29 mmol/L (ref 22–32)
Calcium: 7.8 mg/dL — ABNORMAL LOW (ref 8.9–10.3)
Chloride: 98 mmol/L (ref 98–111)
Creatinine, Ser: 0.46 mg/dL — ABNORMAL LOW (ref 0.61–1.24)
GFR, Estimated: >60 mL/min (ref >60)
Glucose, Bld: 121 mg/dL — ABNORMAL HIGH (ref 70–99)
Potassium: 3.5 mmol/L (ref 3.5–5.1)
Sodium: 132 mmol/L — ABNORMAL LOW (ref 135–145)

## 2020-09-11 LAB — PHOSPHORUS: Phosphorus: 3.3 mg/dL (ref 2.5–4.6)

## 2020-09-11 LAB — ACID FAST SMEAR (AFB, MYCOBACTERIA): Acid Fast Smear: NEGATIVE

## 2020-09-11 LAB — MAGNESIUM: Magnesium: 1.6 mg/dL — ABNORMAL LOW (ref 1.7–2.4)

## 2020-09-11 MED ORDER — MAGNESIUM SULFATE 4 GM/100ML IV SOLN
4.0000 g | Freq: Once | INTRAVENOUS | Status: AC
  Administered 2020-09-11: 4 g via INTRAVENOUS
  Filled 2020-09-11: qty 100

## 2020-09-11 MED ORDER — FUROSEMIDE 10 MG/ML IJ SOLN
40.0000 mg | Freq: Once | INTRAMUSCULAR | Status: AC
  Administered 2020-09-11: 40 mg via INTRAVENOUS
  Filled 2020-09-11: qty 4

## 2020-09-11 MED ORDER — TRAVASOL 10 % IV SOLN
INTRAVENOUS | Status: AC
  Filled 2020-09-11: qty 1117.2

## 2020-09-11 MED ORDER — DEXMEDETOMIDINE HCL IN NACL 400 MCG/100ML IV SOLN
0.0000 ug/kg/h | INTRAVENOUS | Status: AC
  Administered 2020-09-12: 0.9 ug/kg/h via INTRAVENOUS
  Administered 2020-09-12: 0.8 ug/kg/h via INTRAVENOUS
  Administered 2020-09-12: 1 ug/kg/h via INTRAVENOUS
  Administered 2020-09-13: 0.5 ug/kg/h via INTRAVENOUS
  Administered 2020-09-13: 1.1 ug/kg/h via INTRAVENOUS
  Administered 2020-09-13: 0.9 ug/kg/h via INTRAVENOUS
  Filled 2020-09-11 (×8): qty 100

## 2020-09-11 MED ORDER — MAGNESIUM SULFATE 2 GM/50ML IV SOLN
2.0000 g | Freq: Once | INTRAVENOUS | Status: AC
  Administered 2020-09-11: 2 g via INTRAVENOUS
  Filled 2020-09-11: qty 50

## 2020-09-11 MED ORDER — POTASSIUM CHLORIDE 10 MEQ/50ML IV SOLN
10.0000 meq | INTRAVENOUS | Status: AC
  Administered 2020-09-11 (×4): 10 meq via INTRAVENOUS
  Filled 2020-09-11 (×2): qty 50

## 2020-09-11 NOTE — Progress Notes (Signed)
PHARMACY - TOTAL PARENTERAL NUTRITION CONSULT NOTE   Indication:  intolerance to enteral feeding  Patient Measurements: Height: 5\' 8"  (172.7 cm) Weight: 59.2 kg (130 lb 8.2 oz) IBW/kg (Calculated) : 68.4 TPN AdjBW (KG): 61 Body mass index is 19.84 kg/m. Usual Weight: 52 kg  Assessment:  54 yom with severe malnutrition and hx benign esophageal stricture with J-tube who is s/p planned robotic Ivor Lewis esophagectomy on 7/28. TF were held due to drainage around J-tube on 7/30, resumed at lower rate on 7/31. TF held again on 8/2 for continued drainage around J-tube. He was taken back to the OR on 8/3 for revision of anastomotic leak. J-tube replaced in IR on 8/4 but continues to have drainage. Significant refeeding risk noted. Pharmacy consulted to begin TPN.   Glucose / Insulin: no hx DM, CBGs controlled on SSI, 4 units used in last 24h Electrolytes: Na 132, K 3.5 - 4 runs ordered by MD, Mg 1.6 - 2g x1, Phos 3.3, CoCa 9.8  Renal: SCr <1 Hepatic: LFTs / Tbili / TG WNL, albumin 1.5, prealbumin <5 Intake / Output; MIVF: UOP 1.6 ml/kg/hr, D5LR at 45 ml/hr, NGT O/P 100 ml, drain O/P 140 ml, chest tube O/P 50 ml, Lasix 40 mg IV x1 today, net +6.6L, LBM 8/2  GI Imaging: 8/3 CT abd/pelvis: small gas-filled collection on the left side of the mediastinum probably represents a small pocket at the esophageal-gastric anastomosis, large amount of pneumoperitoneum GI Surgeries / Procedures:  8/4 J-tube replaced in IR  Central access: double lumen CVC 09/01/20, triple lumen PICC 8/5 TPN start date: 09/09/20  Nutritional Goals (per RD recommendation on 8/5): kCal: 2100-2300, Protein: 100-120 g, Fluid: >= 2 L Goal TPN rate is 95 mL/hr (provides 112 g of protein and 2216 kcals per day)  Current Nutrition:  NPO and TPN  Plan:  Increase TPN to goal of 48mL/hr at 1800 Electrolytes in TPN: Na 13mEq/L, K 85mEq/L, Ca 77mEq/L, Mg 41mEq/L, and Phos 4mmol/L. Cl:Ac 2:1. Lytes increasing with rate increase This  TPN provides 112 g of protein, 319 g of dextrose, and 68 g of lipids for a total of 2216 kcals meeting 100% of patient needs Add standard MVI and trace elements to TPN Continue TCTS SSI q4h SSI and adjust as needed  Monitor TPN labs on Mon/Thurs  KCl 10 meq IV q1h x4 per MD Mg 2 g IV x1 per MD  Thank you for involving pharmacy in this patient's care.  12m, PharmD, BCPS Clinical Pharmacist Clinical phone for 09/11/2020 until 3p is [phone] 09/11/2020 7:00 AM  **Pharmacist phone directory can be found on [email or web address] listed under [facility]**

## 2020-09-11 NOTE — Plan of Care (Signed)
  Problem: Education: Goal: Knowledge of the prescribed therapeutic regimen will improve Outcome: Progressing   Problem: Bowel/Gastric: Goal: Gastrointestinal status for postoperative course will improve Outcome: Progressing   Problem: Nutritional: Goal: Ability to achieve adequate nutritional intake will improve Outcome: Progressing   Problem: Safety: Goal: Non-violent Restraint(s) Outcome: Progressing

## 2020-09-11 NOTE — Progress Notes (Signed)
      [address]       [place] [address]             [phone]                 4 Days Post-Op Procedure(s) (LRB): XI ROBOTIC ASSISTED THORASCOPY-DECORTICATION & REVISION OF ESOPHAGECTOMY (Right) ESOPHAGOGASTRODUODENOSCOPY (EGD) (N/A) INTERCOSTAL NERVE BLOCK (Right)   Events: No events. _______________________________________________________________ Vitals: BP (!) 101/51   Pulse 60   Temp 98 F (36.7 C) (Oral)   Resp 16   Ht 5\' 8"  (1.727 m)   Wt 59.2 kg   SpO2 97%   BMI 19.84 kg/m   - Neuro: awake  - Cardiovascular: Sinus  Drips: Levophed at 8.   CVP:  [4 mmHg-9 mmHg] 4 mmHg  - Pulm:  Vent Mode: PRVC FiO2 (%):  [40 %] 40 % Set Rate:  [15 bmp] 15 bmp Vt Set:  [540 mL] 540 mL PEEP:  [5 cmH20] 5 cmH20 Plateau Pressure:  [15 cmH20-27 cmH20] 27 cmH20  ABG    Component Value Date/Time   PHART 7.414 09/08/2020 0444   PCO2ART 32.8 09/08/2020 0444   PO2ART 98 09/08/2020 0444   HCO3 20.7 09/08/2020 0444   TCO2 22 09/08/2020 0444   ACIDBASEDEF 3.0 (H) 09/08/2020 0444   O2SAT 97.0 09/08/2020 0444    - Abd: Soft - Extremity: Cool  .Intake/Output      08/06 0701 08/07 0700 08/07 0701 08/08 0700   I.V. (mL/kg) 2667.9 (45.1)    IV Piggyback 736.9    Total Intake(mL/kg) 3404.8 (57.5)    Urine (mL/kg/hr) 2115 (1.5)    Emesis/NG output 100    Drains 140    Chest Tube 50    Total Output 2405    Net +999.8            _______________________________________________________________ Labs: CBC Latest Ref Rng & Units 09/10/2020 09/09/2020 09/08/2020  WBC 4.0 - 10.5 K/uL 15.1(H) 22.0(H) -  Hemoglobin 13.0 - 17.0 g/dL 11/08/2020) 10.2(L) 10.9(L)  Hematocrit 39.0 - 52.0 % 29.7(L) 30.1(L) 32.0(L)  Platelets 150 - 400 K/uL 241 292 -   CMP Latest Ref Rng & Units 09/11/2020 09/10/2020 09/09/2020  Glucose 70 - 99 mg/dL 11/09/2020) 053(Z) 767(H)  BUN 6 - 20 mg/dL 6 6 10   Creatinine 0.61 - 1.24 mg/dL 419(F) ) 7.90(W)  Sodium 135 - 145 mmol/L 132(L) 131(L)  131(L)  Potassium 3.5 - 5.1 mmol/L 3.5 3.1(L) 3.4(L)  Chloride 98 - 111 mmol/L 98 98 99  CO2 22 - 32 mmol/L 29 28 25   Calcium 8.9 - 10.3 mg/dL 7.8(L) 7.8(L) 7.8(L)  Total Protein 6.5 - 8.1 g/dL - 4.0(L) -  Total Bilirubin 0.3 - 1.2 mg/dL - 1.0 -  Alkaline Phos 38 - 126 U/L - 52 -  AST 15 - 41 U/L - 25 -  ALT 0 - 44 U/L - 15 -    CXR: -  _______________________________________________________________  Assessment and Plan: POD 10 s/p robotic Ivor Lewis esophagectomy, postop day 4 status post reexploration with reinforcement of anastomosis.  Neuro: Wean sedation as tolerated CV: Wean Levophed as tolerated Pulm: Wean vent as tolerated.  Appreciate pulmonary critical care. Renal: Creatinine stable. GI:  On TPN.  Will see about upsizing drain next week Heme: Stable ID: On Vanco Zosyn and Diflucan day 4 Endo: Sliding scale insulin Dispo: Continue ICU care.   4.09(B 09/11/2020 7:59 AM

## 2020-09-11 NOTE — Progress Notes (Signed)
eLink Physician-Brief Progress Note Patient Name: AILTON VALLEY DOB: 26-Nov-1965 MRN: [id number]   Date of Service  09/11/2020  HPI/Events of Note  Bilateral soft wrist restraints order renewal requseted for this intubated patient  eICU Interventions  Order placed     Intervention Category Minor Interventions: Routine modifications to care plan (e.g. PRN medications for pain, fever)  Oretha Milch 09/11/2020, 7:57 PM

## 2020-09-11 NOTE — Progress Notes (Addendum)
NAME:  Ralph [name], MRN:  [id number], DOB:  1965-11-01, LOS: 10 ADMISSION DATE:  09/01/2020, CONSULTATION DATE:  8/3 REFERRING MD:  Dr. Cliffton Asters, CHIEF COMPLAINT:  esophageal rupture esophageal stricture   History of Present Illness:  Patient is a 56 yo M w/ PMH anxiety, CAD, MI, stroke presents to [facility] on 7/28 for esophagectomy w/ Dr. Cliffton Asters for esophageal stricture.  On 7/28, Dr. Cliffton Asters performed the esophagectomy without complication.  8/3 patient taken back to OR for esophageal rupture s/p esophagectomy by Dr. Cliffton Asters. Unable to extubate post procedure. Patient remains intubated on mechanical ventilation. ABG with respiratory acidosis.   PCCM consulted for vent management.  Pertinent  Medical History   Past Medical History:  Diagnosis Date   Anxiety    Coronary artery disease    Myocardial infarction [facility])    Stroke (HCC)    Significant Hospital Events: Including procedures, antibiotic start and stop dates in addition to other pertinent events   7/28 admitted for planned esophagectomy. Chest tube was placed on right chest. J tube in place. 2 JP drains in place. Patient was extubated post procedure. Patient developed subcutaneous emphysema. Unable to use J tube due to leakage around site. 8/3: PCCM consulted for vent management after pt brought back to OR for esophageal rupture/ leak. Vanc/zosyn/fluconazole started. Hypotension noted post-op. Felt 2/2 Sepsis; treated w/ volume and pressors.  8/4: Still on norepinephrine.  Central venous pressure suggesting he is still hypovolemic so receiving fluid.  Spontaneous breathing trial initiated but not ready for extubation due to work of breathing.  culture sent from chest tube.  J-tube replaced in interventional radiology due to leaking.  Tube feeds resumed in the afternoon 8/5: J-tube site again leaking, tube feeds placed on hold around midnight.Still pressor dependent . Passed SBT. WOB improved.  Failed extubation and was  reintubated  Interim History / Subjective:   Patient remains intubated critically ill on mechanical life support.  Had episodes of bradycardia yesterday.  Remained hemodynamically stable.  Precedex was stopped.  Objective   Blood pressure 93/60, pulse (!) 54, temperature 98.1 F (36.7 C), temperature source Oral, resp. rate 16, height 5\' 8"  (1.727 m), weight 59.2 kg, SpO2 96 %. CVP:  [4 mmHg-9 mmHg] 4 mmHg  Vent Mode: PRVC FiO2 (%):  [40 %] 40 % Set Rate:  [15 bmp] 15 bmp Vt Set:  [540 mL] 540 mL PEEP:  [5 cmH20] 5 cmH20 Plateau Pressure:  [15 cmH20-24 cmH20] 15 cmH20   Intake/Output Summary (Last 24 hours) at 09/11/2020 0710 Last data filed at 09/11/2020 0400 Gross per 24 hour  Intake 3404.81 ml  Output 2405 ml  Net 999.81 ml   Filed Weights   09/09/20 0500 09/10/20 0424 09/11/20 0427  Weight: 63.2 kg 60.1 kg 59.2 kg    Examination:  General: Elderly gentleman, chronically ill-appearing intubated on mechanical life support HEENT: NCAT, tracking appropriately Pulmonary: Bilateral mechanically ventilated breath sounds Cardiac: Regular rate rhythm, S1-S2 Abdomen: Soft, J-tube in place Extremities: No significant edema Neuro: Sedated on mechanical support, will arouse to voice  Resolved Hospital Problem list   AKI Septic shock  Assessment & Plan:   Acute respiratory failure w/ hypoxia and hypercarbia Portable chest x-ray personally reviewed.  Tubes and lines are in satisfactory position.  Bilateral airspace disease again noted.  Looks fairly similar in comparison to prior exam. -Currently on pressure support but exhibiting some accessory use Plan: Patient remains on adult mechanical vent support Continue to wean PEEP and FiO2 as tolerated  Remains n.p.o. Overall he is very weak.  Unfortunately do think he is at risk for prolonged mechanical vent support need an tracheostomy tube.  Sepsis 2/2 Esophageal rupture s/p Esophagectomy, polymicrobial, Pseudomonas, E. coli, strep  angiosis Drug-related hypotension.  Plan: Continue to titrate norepinephrine to maintain mean mean arterial pressure greater than 65 Stop vancomycin Continue Zosyn plus fluconazole  Severe protein calorie malnutrition.  Further complicated by leaking J-tube. -not sure why this continues to be an issue.  Plan Having to hold tube feeds due to J-tube issues. Continue TPN  Fluid and electrolyte imbalance: Hyponatremia, hypokalemia, hypomagnesemia. Plan Follow electrolytes and recheck as needed  H/o CAD Plan Remains on telemetry  Mild anemia  Plan Observe   Best Practice (right click and "Reselect all SmartList Selections" daily)   Diet/type: NPO DVT prophylaxis: LMWH GI prophylaxis: PPI Lines: Central line Foley:  Yes, and it is still needed Code Status:  full code Last date of multidisciplinary goals of care discussion [per primary]  This patient is critically ill with multiple organ system failure; which, requires frequent high complexity decision making, assessment, support, evaluation, and titration of therapies. This was completed through the application of advanced monitoring technologies and extensive interpretation of multiple databases. During this encounter critical care time was devoted to patient care services described in this note for 31 minutes.  Josephine Igo, DO [facility] 09/11/2020 7:10 AM

## 2020-09-11 NOTE — Progress Notes (Signed)
AM K+ 3.5 and Magnesium 1.7 with creat 0.46 and GFR > 60. CCM Electrolyte protocol initiated.

## 2020-09-12 ENCOUNTER — Inpatient Hospital Stay (HOSPITAL_COMMUNITY): Payer: Medicaid Other

## 2020-09-12 DIAGNOSIS — K222 Esophageal obstruction: Secondary | ICD-10-CM | POA: Diagnosis not present

## 2020-09-12 DIAGNOSIS — J9601 Acute respiratory failure with hypoxia: Secondary | ICD-10-CM | POA: Diagnosis not present

## 2020-09-12 HISTORY — PX: IR REPLC DUODEN/JEJUNO TUBE PERCUT W/FLUORO: IMG2334

## 2020-09-12 LAB — GLUCOSE, CAPILLARY
Glucose-Capillary: 124 mg/dL — ABNORMAL HIGH (ref 70–99)
Glucose-Capillary: 131 mg/dL — ABNORMAL HIGH (ref 70–99)
Glucose-Capillary: 118 mg/dL — ABNORMAL HIGH (ref 70–99)
Glucose-Capillary: 128 mg/dL — ABNORMAL HIGH (ref 70–99)
Glucose-Capillary: 135 mg/dL — ABNORMAL HIGH (ref 70–99)

## 2020-09-12 LAB — CBC
HCT: 28.3 % — ABNORMAL LOW (ref 39.0–52.0)
Hemoglobin: 9.5 g/dL — ABNORMAL LOW (ref 13.0–17.0)
MCH: 30.6 pg (ref 26.0–34.0)
MCHC: 33.6 g/dL (ref 30.0–36.0)
MCV: 91.3 fL (ref 80.0–100.0)
Platelets: 206 10*3/uL (ref 150–400)
RBC: 3.1 MIL/uL — ABNORMAL LOW (ref 4.22–5.81)
RDW: 15.2 % (ref 11.5–15.5)
WBC: 15.1 10*3/uL — ABNORMAL HIGH (ref 4.0–10.5)
nRBC: 0 % (ref 0.0–0.2)

## 2020-09-12 LAB — COMPREHENSIVE METABOLIC PANEL
ALT: 15 U/L (ref 0–44)
AST: 25 U/L (ref 15–41)
Albumin: 1.3 g/dL — ABNORMAL LOW (ref 3.5–5.0)
Alkaline Phosphatase: 58 U/L (ref 38–126)
Anion gap: 8 (ref 5–15)
BUN: 8 mg/dL (ref 6–20)
CO2: 30 mmol/L (ref 22–32)
Calcium: 7.9 mg/dL — ABNORMAL LOW (ref 8.9–10.3)
Chloride: 96 mmol/L — ABNORMAL LOW (ref 98–111)
Creatinine, Ser: 0.53 mg/dL — ABNORMAL LOW (ref 0.61–1.24)
GFR, Estimated: >60 mL/min (ref >60)
Glucose, Bld: 132 mg/dL — ABNORMAL HIGH (ref 70–99)
Potassium: 3.5 mmol/L (ref 3.5–5.1)
Sodium: 134 mmol/L — ABNORMAL LOW (ref 135–145)
Total Bilirubin: 1.8 mg/dL — ABNORMAL HIGH (ref 0.3–1.2)
Total Protein: 4.2 g/dL — ABNORMAL LOW (ref 6.5–8.1)

## 2020-09-12 LAB — MAGNESIUM: Magnesium: 2 mg/dL (ref 1.7–2.4)

## 2020-09-12 LAB — DIFFERENTIAL
Abs Immature Granulocytes: 0 10*3/uL (ref 0.00–0.07)
Basophils Absolute: 0 10*3/uL (ref 0.0–0.1)
Basophils Relative: 0 %
Eosinophils Absolute: 0.2 10*3/uL (ref 0.0–0.5)
Eosinophils Relative: 1 %
Lymphocytes Relative: 1 %
Lymphs Abs: 0.2 10*3/uL — ABNORMAL LOW (ref 0.7–4.0)
Monocytes Absolute: 0.3 10*3/uL (ref 0.1–1.0)
Monocytes Relative: 2 %
Neutro Abs: 14.5 10*3/uL — ABNORMAL HIGH (ref 1.7–7.7)
Neutrophils Relative %: 96 %
nRBC: 0 /100 WBC

## 2020-09-12 LAB — TRIGLYCERIDES: Triglycerides: 88 mg/dL (ref <150)

## 2020-09-12 LAB — PREALBUMIN: Prealbumin: <5 mg/dL — ABNORMAL LOW (ref 18–38)

## 2020-09-12 LAB — PHOSPHORUS: Phosphorus: 3.8 mg/dL (ref 2.5–4.6)

## 2020-09-12 MED ORDER — POTASSIUM CHLORIDE 10 MEQ/50ML IV SOLN
10.0000 meq | INTRAVENOUS | Status: AC
Start: 2020-09-12 — End: 2020-09-12
  Administered 2020-09-12 (×4): 10 meq via INTRAVENOUS
  Filled 2020-09-12 (×4): qty 50

## 2020-09-12 MED ORDER — TRAVASOL 10 % IV SOLN
INTRAVENOUS | Status: AC
  Filled 2020-09-12: qty 1117.2

## 2020-09-12 MED ORDER — FUROSEMIDE 10 MG/ML IJ SOLN
40.0000 mg | Freq: Three times a day (TID) | INTRAMUSCULAR | Status: AC
  Administered 2020-09-12 – 2020-09-13 (×3): 40 mg via INTRAVENOUS
  Filled 2020-09-12 (×3): qty 4

## 2020-09-12 MED ORDER — LIDOCAINE VISCOUS HCL 2 % MT SOLN
OROMUCOSAL | Status: AC
  Filled 2020-09-12: qty 15

## 2020-09-12 MED ORDER — IOHEXOL 300 MG/ML  SOLN
50.0000 mL | Freq: Once | INTRAMUSCULAR | Status: AC | PRN
  Administered 2020-09-12: 20 mL

## 2020-09-12 MED ORDER — ALBUMIN HUMAN 25 % IV SOLN
25.0000 g | Freq: Four times a day (QID) | INTRAVENOUS | Status: AC
  Administered 2020-09-12 – 2020-09-13 (×4): 25 g via INTRAVENOUS
  Filled 2020-09-12 (×4): qty 100

## 2020-09-12 NOTE — Progress Notes (Signed)
      [address]       [place] [address]             [phone]      Remains on vent  BP (!) 91/55   Pulse (!) 53   Temp 100.3 F (37.9 C) (Oral)   Resp 16   Ht 5\' 8"  (1.727 m)   Wt 62 kg   SpO2 93%   BMI 20.78 kg/m  Norepi at 6 On 5.5 mg dilaudid  Intake/Output Summary (Last 24 hours) at 09/12/2020 1813 Last data filed at 09/12/2020 1800 Gross per 24 hour  Intake 4243.58 ml  Output 3252 ml  Net 991.58 ml   JT replaced earlier  [place] C. [place]haven, MD [facility] [phone]

## 2020-09-12 NOTE — Procedures (Signed)
Interventional Radiology Procedure Note  Hx: Surgical placed jejunostomy feeding tube, now leaking.   Procedure:  Exchange of jejunostomy, with new 33F jejunostomy.  Amputated the final 10cm.   5cc in the balloon.   Complications: None  Recommendations:  - Ok to use   Signed,  Yvone Neu. Loreta Ave, DO

## 2020-09-12 NOTE — Progress Notes (Signed)
PT Cancellation Note  Patient Details Name: Ralph [name] MRN: [id number] DOB: 1965/05/02   Cancelled Treatment:    Reason Eval/Treat Not Completed: Patient not medically ready.  RN asked to hold again today.  Still intubated. 09/12/2020  Jacinto Halim., PT Acute Rehabilitation Services [phone]  (pager) [phone]  (office)   Ralph [name] 09/12/2020, 1:48 PM

## 2020-09-12 NOTE — Progress Notes (Signed)
PHARMACY - TOTAL PARENTERAL NUTRITION CONSULT NOTE   Indication:  intolerance to enteral feeding  Patient Measurements: Height: 5\' 8"  (172.7 cm) Weight: 62 kg (136 lb 11 oz) IBW/kg (Calculated) : 68.4 TPN AdjBW (KG): 61 Body mass index is 20.78 kg/m. Usual Weight: 52 kg  Assessment:  54 yom with severe malnutrition and hx benign esophageal stricture with J-tube who is s/p planned robotic Ivor Lewis esophagectomy on 7/28. TF were held due to drainage around J-tube on 7/30, resumed at lower rate on 7/31. TF held again on 8/2 for continued drainage around J-tube. He was taken back to the OR on 8/3 for revision of anastomotic leak. J-tube replaced in IR on 8/4 but continues to have drainage. Significant refeeding risk noted. Pharmacy consulted for TPN.   Glucose / Insulin: no hx DM, CBGs controlled on SSI, 8 units used in last 24h Electrolytes: Na 134, K 3.5 - will replace, Mg 2, Phos 3.8, CoCa 9.8  Renal: SCr <1 Hepatic: LFTs / TG WNL, Tbili up at 1.8, albumin 1.3, prealbumin <5 Intake / Output; MIVF: UOP 2.5 ml/kg/hr after Lasix 40 IV x1 yesterday, D5LR at 45 ml/hr, NGT O/P not charted, drain O/P decreased at 10 ml, chest tube O/P 40 ml, net +8L, LBM 8/2 - Relgan 10 q6h  GI Imaging: 8/3 CT abd/pelvis: small gas-filled collection on the left side of the mediastinum probably represents a small pocket at the esophageal-gastric anastomosis, large amount of pneumoperitoneum GI Surgeries / Procedures:  8/4 J-tube replaced in IR  Central access: double lumen CVC 09/01/20, triple lumen PICC 8/5 TPN start date: 09/09/20  Nutritional Goals (per RD recommendation on 8/5): kCal: 2100-2300, Protein: 100-120 g, Fluid: >= 2 L Goal TPN rate is 95 mL/hr (provides 112 g of protein and 2216 kcals per day)  Current Nutrition:  NPO and TPN  Plan:  Continue TPN to goal of 81mL/hr at 1800 Electrolytes in TPN: Na 75mEq/L, increase K to 60 mEq/L, Ca 78mEq/L, Mg 8mEq/L, and Phos 79mmol/L. Maximize Cl.  This  TPN provides 112 g of protein, 319 g of dextrose, and 68 g of lipids for a total of 2216 kcals meeting 100% of patient needs Add standard MVI and trace elements to TPN Continue TCTS SSI q4h SSI and adjust as needed  Monitor TPN labs on Mon/Thurs  KCl 10 meq IV q1h x4  Recheck BMP and Mag in AM  Thank you for involving pharmacy in this patient's care.  12m, PharmD, BCPS, BCCCP Clinical Pharmacist Please refer to [facility] for [facility] numbers 09/12/2020 7:04 AM  **Pharmacist phone directory can be found on [email or web address] listed under [facility]**

## 2020-09-12 NOTE — Plan of Care (Signed)
  Problem: Elimination: Goal: Will not experience complications related to urinary retention Outcome: Progressing Note: Removed foley; pt voiding spontaneously    Problem: Safety: Goal: Non-violent Restraint(s) Outcome: Progressing   Problem: Nutritional: Goal: Ability to achieve adequate nutritional intake will improve Outcome: Not Progressing   Problem: Clinical Measurements: Goal: Postoperative complications will be avoided or minimized Outcome: Not Progressing   Problem: Respiratory: Goal: Ability to maintain a clear airway will improve Outcome: Not Progressing

## 2020-09-12 NOTE — Progress Notes (Signed)
[address]       [place] [address]             [phone]                 5 Days Post-Op Procedure(s) (LRB): XI ROBOTIC ASSISTED THORASCOPY-DECORTICATION & REVISION OF ESOPHAGECTOMY (Right) ESOPHAGOGASTRODUODENOSCOPY (EGD) (N/A) INTERCOSTAL NERVE BLOCK (Right)   Events: No events. Failed SBT today _______________________________________________________________ Vitals: BP (!) 91/58   Pulse (!) 52   Temp 98.1 F (36.7 C) (Oral)   Resp 15   Ht 5\' 8"  (1.727 m)   Wt 62 kg   SpO2 96%   BMI 20.78 kg/m   - Neuro: awake  - Cardiovascular: Sinus  Drips: Levophed at 8.   CVP:  [2 mmHg-59 mmHg] 5 mmHg  - Pulm:  Vent Mode: PRVC FiO2 (%):  [40 %-50 %] 50 % Set Rate:  [15 bmp] 15 bmp Vt Set:  [540 mL] 540 mL PEEP:  [5 cmH20] 5 cmH20 Pressure Support:  [12 cmH20] 12 cmH20 Plateau Pressure:  [18 cmH20-26 cmH20] 20 cmH20  ABG    Component Value Date/Time   PHART 7.414 09/08/2020 0444   PCO2ART 32.8 09/08/2020 0444   PO2ART 98 09/08/2020 0444   HCO3 20.7 09/08/2020 0444   TCO2 22 09/08/2020 0444   ACIDBASEDEF 3.0 (H) 09/08/2020 0444   O2SAT 97.0 09/08/2020 0444    - Abd: Soft - Extremity: Cool  .Intake/Output      08/07 0701 08/08 0700 08/08 0701 08/09 0700   I.V. (mL/kg) 2885.8 (46.5)    Other 2125    IV Piggyback 877.3    Chest Tube 20    Total Intake(mL/kg) 5908.1 (95.3)    Urine (mL/kg/hr) 3750 (2.5) 425 (1.6)   Emesis/NG output     Drains 50 35   Chest Tube 40 60   Total Output 3840 520   Net +2068.1 -520           _______________________________________________________________ Labs: CBC Latest Ref Rng & Units 09/12/2020 09/11/2020 09/10/2020  WBC 4.0 - 10.5 K/uL 15.1(H) 16.9(H) 15.1(H)  Hemoglobin 13.0 - 17.0 g/dL 11/10/2020) 10.1(L) 9.9(L)  Hematocrit 39.0 - 52.0 % 28.3(L) 30.3(L) 29.7(L)  Platelets 150 - 400 K/uL 206 198 241   CMP Latest Ref Rng & Units 09/12/2020 09/11/2020 09/10/2020  Glucose 70 - 99 mg/dL 11/10/2020) 756(E) 332(R)   BUN 6 - 20 mg/dL 8 6 6   Creatinine 0.61 - 1.24 mg/dL 518(A) ) 4.16(S)  Sodium 135 - 145 mmol/L 134(L) 132(L) 131(L)  Potassium 3.5 - 5.1 mmol/L 3.5 3.5 3.1(L)  Chloride 98 - 111 mmol/L 96(L) 98 98  CO2 22 - 32 mmol/L 30 29 28   Calcium 8.9 - 10.3 mg/dL 7.9(L) 7.8(L) 7.8(L)  Total Protein 6.5 - 8.1 g/dL 4.2(L) - 4.0(L)  Total Bilirubin 0.3 - 1.2 mg/dL 0.63(K) - 1.0  Alkaline Phos 38 - 126 U/L 58 - 52  AST 15 - 41 U/L 25 - 25  ALT 0 - 44 U/L 15 - 15    CXR: -  _______________________________________________________________  Assessment and Plan: POD 10 s/p robotic Ivor Lewis esophagectomy, postop day 4 status post reexploration with reinforcement of anastomosis.  Neuro: Wean sedation as tolerated CV: Wean Levophed as tolerated Pulm: will plan to trach later this week.Appreciate pulmonary critical care. Renal: Creatinine stable. GI:  On TPN.  Will see about upsizing drain next week Heme: Stable ID: On Vanco Zosyn and Diflucan day 5 Endo: Sliding scale  insulin Dispo: Continue ICU care.   Corliss Skains 09/12/2020 11:18 AM

## 2020-09-12 NOTE — Progress Notes (Addendum)
NAME:  Ralph [name], MRN:  [id number], DOB:  1965-09-07, LOS: 11 ADMISSION DATE:  09/01/2020, CONSULTATION DATE:  8/3 REFERRING MD:  Dr. Cliffton Asters, CHIEF COMPLAINT:  esophageal rupture esophageal stricture   History of Present Illness:  Patient is a 55 yo M w/ PMH anxiety, CAD, MI, stroke presents to [facility] on 7/28 for esophagectomy w/ Dr. Cliffton Asters for esophageal stricture.  On 7/28, Dr. Cliffton Asters performed the esophagectomy without complication.  8/3 patient taken back to OR for esophageal rupture s/p esophagectomy by Dr. Cliffton Asters. Unable to extubate post procedure. Patient remains intubated on mechanical ventilation. ABG with respiratory acidosis.   PCCM consulted for vent management.  Pertinent  Medical History   Past Medical History:  Diagnosis Date   Anxiety    Coronary artery disease    Myocardial infarction [facility])    Stroke (HCC)    Significant Hospital Events: Including procedures, antibiotic start and stop dates in addition to other pertinent events   7/28 admitted for planned esophagectomy. Chest tube was placed on right chest. J tube in place. 2 JP drains in place. Patient was extubated post procedure. Patient developed subcutaneous emphysema. Unable to use J tube due to leakage around site. 8/3: PCCM consulted for vent management after pt brought back to OR for esophageal rupture/ leak. Vanc/zosyn/fluconazole started. Hypotension noted post-op. Felt 2/2 Sepsis; treated w/ volume and pressors.  8/4: Still on norepinephrine.  Central venous pressure suggesting he is still hypovolemic so receiving fluid.  Spontaneous breathing trial initiated but not ready for extubation due to work of breathing.  culture sent from chest tube.  J-tube replaced in interventional radiology due to leaking.  Tube feeds resumed in the afternoon 8/5: J-tube site again leaking, tube feeds placed on hold around midnight.Still pressor dependent . Passed SBT. WOB improved.  Failed extubation and was  reintubated  Interim History / Subjective:  Remains bradycardic Back on precedex Remains pressor dependent J tube continuing to leak  Objective   Blood pressure 104/68, pulse (!) 53, temperature 98.1 F (36.7 C), temperature source Axillary, resp. rate 15, height 5\' 8"  (1.727 m), weight 62 kg, SpO2 97 %. CVP:  [2 mmHg-59 mmHg] 11 mmHg  Vent Mode: PRVC FiO2 (%):  [40 %-50 %] 50 % Set Rate:  [15 bmp] 15 bmp Vt Set:  [540 mL] 540 mL PEEP:  [5 cmH20] 5 cmH20 Plateau Pressure:  [18 cmH20-27 cmH20] 18 cmH20   Intake/Output Summary (Last 24 hours) at 09/12/2020 0731 Last data filed at 09/12/2020 0700 Gross per 24 hour  Intake 5908.08 ml  Output 3840 ml  Net 2068.08 ml    Filed Weights   09/10/20 0424 09/11/20 0427 09/12/20 0348  Weight: 60.1 kg 59.2 kg 62 kg    Examination:  General: Elderly gentleman, chronically ill-appearing intubated on mechanical life support HEENT: ETT Pulmonary: Bilateral mechanically ventilated breath sounds Cardiac: bradycardic, no lower extremity edema Abdomen: Soft, J-tube in place Extremities: No significant edema Neuro: Sedated on mechanical support, will arouse to voice  Resolved Hospital Problem list   AKI  Assessment & Plan:   Acute respiratory failure w/ hypoxia and hypercarbia Portable chest x-ray personally reviewed.  Tubes and lines are in satisfactory position.  Bilateral airspace disease again noted.  Looks fairly similar in comparison to prior exam. Vent day #12. Failed extubation 8/5 requiring re-intubation.  Plan: -Will continue to work on weaning the vent but will hold off on extubating until J-tube is upsized.  If unable to extubate after that, will need  to discuss tracheostomy. -Will likely need to consider tracheostomy in the near future -VAP bundle -Sedation protocol. RASS goal -2 to -3  Septic shock 2/2 Esophageal rupture s/p Esophagectomy, polymicrobial, Pseudomonas, E. coli, strep angiosis Tmax overnight  100.4 Plan: Continue to wean levo for a MAP goal >65 Continue Zosyn plus fluconazole  Esophageal stricture s/p esophagectomy complicated by post operative esophageal rupture and J-tube leakage Severe protein calorie malnutrition.   Plan -Plan is to upsize J-tube -Continue TPN.  -Continue zosyn and fluconazole  Post operative anemia, Chronic anemia -Continue to monitor  Hyperbilirubinemia. Liver enzymes normal.  -Monitor   Best Practice (right click and "Reselect all SmartList Selections" daily)   Diet/type: NPO DVT prophylaxis: LMWH GI prophylaxis: PPI Lines: Central line, NG, J-tube, PICC, Foley, ETT Foley:  Yes, and it is still needed Code Status:  full code Last date of multidisciplinary goals of care discussion [per primary]  [name], MD Internal Medicine Resident PGY-3 [facility] Pager: [phone] 09/12/2020 7:39 AM     09/12/2020 I saw and evaluated the patient. Discussed with resident and agree with resident's findings and plan as documented in the resident's note.  I have seen and evaluated the patient for postop respiratory failure.  S:  Pain tough to control. Still having issues finding happy medium.  O: Blood pressure (!) 92/57, pulse (!) 52, temperature 99.1 F (37.3 C), temperature source Axillary, resp. rate 17, height 5\' 8"  (1.727 m), weight 62 kg, SpO2 94 %.  Weak cachetic man on vent Severe rhonci bilaterally Thick secretions Muscle wasting No edema Moves all 4 ext spontaneously and purposefully LUQ J tube with copious drainage Multiple JP drains on R abdomen  Patient Lines/Drains/Airways Status     Active Line/Drains/Airways     Name Placement date Placement time Site Days   PICC Triple Lumen 09/09/20 PICC Right Brachial 40 cm 0 cm 09/09/20  1641  -- 3   CVC Double Lumen 09/01/20 Right Internal jugular 16 cm 09/01/20  1036  -- 11   Chest Tube 1 Right;Lateral Mediastinal 19 Fr. 09/07/20  1619   Mediastinal  5   Closed System Drain 1 Lateral RLQ Bulb (JP) 19 Fr. 09/01/20  1633  RLQ  11   Closed System Drain 2 Lateral;Right RUQ Bulb (JP) 09/07/20  --  RUQ  5   NG/OG Vented/Dual Lumen Nasogastric 16 Fr. Left nare External length of tube 77 cm 09/07/20  1650  Left nare  5   Gastrostomy/Enterostomy Jejunostomy LUQ 09/08/20  1407  LUQ  4   External Urinary Catheter 09/12/20  0915  --  less than 1   Y Chest Tube 1 and 2 1 Right;Lateral Pleural 19 Fr. 2 Right;Lateral Pleural 28 Fr. 09/07/20  1618  -- 5   Airway 7.5 mm 09/09/20  1029  -- 3   Incision (Closed) 05/31/20 Abdomen Other (Comment) 05/31/20  1137  -- 104   Incision (Closed) 09/01/20 Abdomen Other (Comment) 09/01/20  1138  -- 11   Incision (Closed) 09/01/20 Chest Right 09/01/20  1600  -- 11   Incision (Closed) 09/07/20 Chest Right 09/07/20  1529  -- 5   Pressure Injury 09/07/20 Sacrum Posterior;Mid Deep Tissue Pressure Injury - Purple or maroon localized area of discolored intact skin or blood-filled blister due to damage of underlying soft tissue from pressure and/or shear. 5x4 purple in color 09/07/20  1700  -- 5           K low Albumin low CBC  stable  CXR a few days ago shows bilateral airspace disease vs. Interstitial edema  A:  - Postop respiratory failure, question new HCAP vs. Interstitial edema, biggest issue is his deconditioning which will make vent wean tricky - Esophageal stricture s/p esophagectomy complicated by rupture requiring emergent repair and J tube placement. - Severe protein calorie malnutrition POA - J tube leaking, to be upsized today by IR, on TPN  P:  - Check tracheal aspirate - Continue antibiotics as ordered - Push diuresis - Once J tube issue figured out, will probably give him one more shot at extubation and proceed to trach if unsuccessful, discussed with Dr. Cliffton Asters - Once enteral access reliable will work toward an oral pain and anxiety regimen - Drain management per TCTS - Guarded  prognosis  Patient critically ill due to respiratory failrue Interventions to address this today vent titration Risk of deterioration without these interventions is high  I personally spent 34 minutes providing critical care not including any separately billable procedures  Myrla Halsted MD [facility] Prefer epic messenger for cross cover needs If after hours, please call E-link

## 2020-09-12 NOTE — Progress Notes (Signed)
Called and updated [facility]  Name Relation Home Work Mobile  [name],[name] Significant other [phone]  [phone]

## 2020-09-12 NOTE — Plan of Care (Signed)
  Problem: Education: Goal: Knowledge of the prescribed therapeutic regimen will improve Outcome: Progressing   Problem: Bowel/Gastric: Goal: Gastrointestinal status for postoperative course will improve Outcome: Progressing   Problem: Nutritional: Goal: Ability to achieve adequate nutritional intake will improve Outcome: Progressing   Problem: Clinical Measurements: Goal: Postoperative complications will be avoided or minimized Outcome: Progressing   Problem: Respiratory: Goal: Ability to maintain a clear airway will improve Outcome: Progressing

## 2020-09-13 ENCOUNTER — Inpatient Hospital Stay (HOSPITAL_COMMUNITY): Payer: Medicaid Other

## 2020-09-13 ENCOUNTER — Telehealth: Payer: Self-pay

## 2020-09-13 DIAGNOSIS — J9601 Acute respiratory failure with hypoxia: Secondary | ICD-10-CM | POA: Diagnosis not present

## 2020-09-13 LAB — BASIC METABOLIC PANEL
Anion gap: 9 (ref 5–15)
BUN: 12 mg/dL (ref 6–20)
CO2: 32 mmol/L (ref 22–32)
Calcium: 8.5 mg/dL — ABNORMAL LOW (ref 8.9–10.3)
Chloride: 93 mmol/L — ABNORMAL LOW (ref 98–111)
Creatinine, Ser: 0.45 mg/dL — ABNORMAL LOW (ref 0.61–1.24)
GFR, Estimated: >60 mL/min (ref >60)
Glucose, Bld: 129 mg/dL — ABNORMAL HIGH (ref 70–99)
Potassium: 3.8 mmol/L (ref 3.5–5.1)
Sodium: 134 mmol/L — ABNORMAL LOW (ref 135–145)

## 2020-09-13 LAB — MAGNESIUM: Magnesium: 1.7 mg/dL (ref 1.7–2.4)

## 2020-09-13 LAB — GLUCOSE, CAPILLARY
Glucose-Capillary: 144 mg/dL — ABNORMAL HIGH (ref 70–99)
Glucose-Capillary: 117 mg/dL — ABNORMAL HIGH (ref 70–99)
Glucose-Capillary: 144 mg/dL — ABNORMAL HIGH (ref 70–99)
Glucose-Capillary: 121 mg/dL — ABNORMAL HIGH (ref 70–99)
Glucose-Capillary: 119 mg/dL — ABNORMAL HIGH (ref 70–99)
Glucose-Capillary: 125 mg/dL — ABNORMAL HIGH (ref 70–99)
Glucose-Capillary: 128 mg/dL — ABNORMAL HIGH (ref 70–99)

## 2020-09-13 LAB — CBC
HCT: 25.4 % — ABNORMAL LOW (ref 39.0–52.0)
Hemoglobin: 8.2 g/dL — ABNORMAL LOW (ref 13.0–17.0)
MCH: 30.1 pg (ref 26.0–34.0)
MCHC: 32.3 g/dL (ref 30.0–36.0)
MCV: 93.4 fL (ref 80.0–100.0)
Platelets: 265 10*3/uL (ref 150–400)
RBC: 2.72 MIL/uL — ABNORMAL LOW (ref 4.22–5.81)
RDW: 15.5 % (ref 11.5–15.5)
WBC: 14.2 10*3/uL — ABNORMAL HIGH (ref 4.0–10.5)
nRBC: 0 % (ref 0.0–0.2)

## 2020-09-13 MED ORDER — FENTANYL CITRATE (PF) 100 MCG/2ML IJ SOLN
200.0000 ug | Freq: Once | INTRAMUSCULAR | Status: AC
  Administered 2020-09-13: 100 ug via INTRAVENOUS
  Filled 2020-09-13: qty 4

## 2020-09-13 MED ORDER — ACETAMINOPHEN 160 MG/5ML PO SOLN
650.0000 mg | Freq: Four times a day (QID) | ORAL | Status: DC | PRN
  Administered 2020-09-13 – 2020-09-28 (×12): 650 mg
  Filled 2020-09-13 (×12): qty 20.3

## 2020-09-13 MED ORDER — MAGNESIUM SULFATE 2 GM/50ML IV SOLN
2.0000 g | Freq: Once | INTRAVENOUS | Status: AC
  Administered 2020-09-13: 2 g via INTRAVENOUS
  Filled 2020-09-13: qty 50

## 2020-09-13 MED ORDER — VECURONIUM BROMIDE 10 MG IV SOLR
10.0000 mg | Freq: Once | INTRAVENOUS | Status: AC
  Administered 2020-09-13: 10 mg via INTRAVENOUS
  Filled 2020-09-13: qty 10

## 2020-09-13 MED ORDER — FUROSEMIDE 10 MG/ML IJ SOLN
40.0000 mg | Freq: Three times a day (TID) | INTRAMUSCULAR | Status: AC
  Administered 2020-09-13 (×3): 40 mg via INTRAVENOUS
  Filled 2020-09-13 (×3): qty 4

## 2020-09-13 MED ORDER — VITAL 1.5 CAL PO LIQD
1000.0000 mL | ORAL | Status: DC
  Administered 2020-09-13: 1000 mL via JEJUNOSTOMY

## 2020-09-13 MED ORDER — MIDAZOLAM HCL 2 MG/2ML IJ SOLN
5.0000 mg | Freq: Once | INTRAMUSCULAR | Status: AC
  Administered 2020-09-13: 3 mg via INTRAVENOUS
  Filled 2020-09-13: qty 6

## 2020-09-13 MED ORDER — ETOMIDATE 2 MG/ML IV SOLN
40.0000 mg | Freq: Once | INTRAVENOUS | Status: AC
  Administered 2020-09-13: 40 mg via INTRAVENOUS
  Filled 2020-09-13: qty 20

## 2020-09-13 MED ORDER — TRAVASOL 10 % IV SOLN
INTRAVENOUS | Status: AC
  Filled 2020-09-13: qty 1117.2

## 2020-09-13 MED ORDER — POTASSIUM CHLORIDE 20 MEQ PO PACK
40.0000 meq | PACK | ORAL | Status: AC
  Administered 2020-09-13 (×2): 40 meq
  Filled 2020-09-13 (×2): qty 2

## 2020-09-13 NOTE — Telephone Encounter (Signed)
Patient's significant other, [name], calls nurse line requesting to speak with Dr. Leveda Anna. Patient is currently admitted in the ICU and is intubated.   [name] is requesting returned phone call from Dr. Leveda Anna to discuss patient further.   [name], RN

## 2020-09-13 NOTE — Progress Notes (Signed)
SLP Cancellation Note  Patient Details Name: Ralph [name] MRN: [id number] DOB: Aug 14, 1965   Cancelled treatment:       Reason Eval/Treat Not Completed: Other (comment) Patient with new tracheostomy today. Orders for SLP eval and treat for PMSV and swallowing received. Will follow pt closely for readiness for SLP interventions as appropriate.      Mahala Menghini., M.A. CCC-SLP Acute Rehabilitation Services Pager [phone] Office [phone]  09/13/2020, 12:43 PM

## 2020-09-13 NOTE — Progress Notes (Signed)
Placed back on full support vent settings due to desaturation to 65% on CPAP / PS.

## 2020-09-13 NOTE — Progress Notes (Signed)
PHARMACY - TOTAL PARENTERAL NUTRITION CONSULT NOTE   Indication:  intolerance to enteral feeding  Patient Measurements: Height: 5\' 8"  (172.7 cm) Weight: 57.1 kg (125 lb 14.1 oz) IBW/kg (Calculated) : 68.4 TPN AdjBW (KG): 61 Body mass index is 19.14 kg/m. Usual Weight: 52 kg  Assessment:  54 yom with severe malnutrition and hx benign esophageal stricture with J-tube who is s/p planned robotic Ivor Lewis esophagectomy on 7/28. TF were held due to drainage around J-tube on 7/30, resumed at lower rate on 7/31. TF held again on 8/2 for continued drainage around J-tube. He was taken back to the OR on 8/3 for revision of anastomotic leak. J-tube replaced in IR on 8/4 but continues to have drainage. Significant refeeding risk noted. Pharmacy consulted for TPN.   Glucose / Insulin: no hx DM, CBGs controlled on SSI, 6 units used in last 24h Electrolytes: Na 134, K up to 3.8 s/p replacement, Mg 1.7, Phos 3.8, CoCa 9.8  Renal: SCr <1 Hepatic: LFTs / TG WNL, Tbili up at 1.8, albumin 1.3, prealbumin <5 Intake / Output; MIVF: UOP 4.5 L out on lasix 40 mg IV x 3 doses, D5LR at 45 ml/hr, NGT O/P 75, drain O/P 75 ml, chest tube O/P 80 ml, net +8L, LBM 8/2 - Relgan 10 q6h  GI Imaging: 8/3 CT abd/pelvis: small gas-filled collection on the left side of the mediastinum probably represents a small pocket at the esophageal-gastric anastomosis, large amount of pneumoperitoneum GI Surgeries / Procedures:  8/4 J-tube replaced in IR  Central access: double lumen CVC 09/01/20, triple lumen PICC 8/5 TPN start date: 09/09/20  Nutritional Goals (per RD recommendation on 8/5): kCal: 2100-2300, Protein: 100-120 g, Fluid: >= 2 L Goal TPN rate is 95 mL/hr (provides 112 g of protein and 2216 kcals per day)  Current Nutrition:  NPO and TPN  Plan:  -Continue TPN to goal of 77mL/hr at 1800 -This TPN provides 112 g of protein, 319 g of dextrose, and 68 g of lipids for a total of 2216 kcals meeting 100% of patient  needs -Electrolytes in TPN: Increase Na to 7mEq/L, Continue K to 60 mEq/L, Ca 24mEq/L, Mg 13mEq/L, and Phos 59mmol/L. Maximize Cl.  Mg 2 gm IV ordered by MD  Add standard MVI and trace elements to TPN Continue TCTS SSI q4h SSI and adjust as needed  Monitor TPN labs on Mon/Thurs Recheck BMP and Mag in AM  Thank you for involving pharmacy in this patient's care.  12m, PharmD., BCPS, BCCCP Clinical Pharmacist Please refer to [facility] for unit-specific pharmacist

## 2020-09-13 NOTE — Progress Notes (Signed)
Nutrition Follow-up  DOCUMENTATION CODES:   Underweight, Severe malnutrition in context of chronic illness  INTERVENTION:   TPN to meet 100% nutritional needs -Continue TPN until pt demonstrating tolerance of feedings via J-tube and demonstrating ability to titrate to goal  Per MD, initiate trickle TF today via J-tube. Plan to start Vital 1.5 at 20 ml/hr  No documented BM x 7 days; recommend aggressive bowel regimen   NUTRITION DIAGNOSIS:   Severe Malnutrition related to chronic illness (recurrent esophageal stricture s/p J-tube) as evidenced by severe muscle depletion, severe fat depletion.  Being addressed via TF   GOAL:   Patient will meet greater than or equal to 90% of their needs  Met  MONITOR:   Vent status, Labs, Weight trends, Skin, I & O's, Other (Comment) (TPN)  REASON FOR ASSESSMENT:   Consult Enteral/tube feeding initiation and management  ASSESSMENT:   55 year old male who presented on 7/28 for esophagogastroduodenoscopy and robotic assisted esophagectomy. PMH of esophageal stricture s/p multiple dilations and J-tube placement in April 2022, stroke, CAD, MI, anxiety.  7/30 - tube feeds held due to drainage around J-tube 7/31 - tube feeds resumed at lower rate 8/01 - swallow study negative 8/02 - tube feeds held due to drainage around J-tube 8/03 - intubated, s/p esophagoscopy, thoracoscopy, decortication, and reinforcement of esophageal anastomosis 8/04 - s/p J-tube replacement in IR, tube feeds resumed 8/05 - tube feeds held due to drainage around new J-tube, extubated, later reintubated 8/08 - J-tube upsized  Pt remains on vent support, failed vent wean Trach placed today Remains on levophed  TPN at 95 ml/hr providing 112 g of protein, 2216 kcals Per MD, plan to trial trickle TF via J-tube today  Weight down to 57 kg  JP drains and Chest tubes with minimal drainage  Labs: sodium 134 (L) Meds: ss novolog, reglan, miralax, KCl, lasix,  colae   Diet Order:   Diet Order             Diet NPO time specified  Diet effective midnight           Diet NPO time specified  Diet effective now                   EDUCATION NEEDS:   No education needs have been identified at this time  Skin:  Skin Assessment: Skin Integrity Issues: Skin Integrity Issues:: DTI, Incisions DTI: sacrum Incisions: abdoment, R chest x 2  Last BM:  09/06/20 large type 7/type 3  Height:   Ht Readings from Last 1 Encounters:  09/07/20 _0  (1.727 m)    Weight:   Wt Readings from Last 1 Encounters:  09/13/20 57.1 kg     BMI:  Body mass index is 19.14 kg/m.  Estimated Nutritional Needs:   Kcal:  2100-2300  Protein:  100-120 grams  Fluid:  >/= 2.0 L   Kerman Passey MS, RDN, LDN, CNSC Registered Dietitian III Clinical Nutrition RD Pager and On-Call Pager Number Located in [place]

## 2020-09-13 NOTE — Progress Notes (Addendum)
NAME:  Ralph [name], MRN:  [id number], DOB:  December 13, 1965, LOS: 12 ADMISSION DATE:  09/01/2020, CONSULTATION DATE:  8/3 REFERRING MD:  Dr. Cliffton Asters, CHIEF COMPLAINT:  esophageal rupture esophageal stricture   History of Present Illness:  Patient is a 55 yo M w/ PMH anxiety, CAD, MI, stroke presents to [facility] on 7/28 for esophagectomy w/ Dr. Cliffton Asters for esophageal stricture.  On 7/28, Dr. Cliffton Asters performed the esophagectomy without complication.  8/3 patient taken back to OR for esophageal rupture s/p esophagectomy by Dr. Cliffton Asters. Unable to extubate post procedure. Patient remains intubated on mechanical ventilation. ABG with respiratory acidosis.   PCCM consulted for vent management.  Pertinent  Medical History   Past Medical History:  Diagnosis Date   Anxiety    Coronary artery disease    Myocardial infarction [facility])    Stroke (HCC)    Significant Hospital Events: Including procedures, antibiotic start and stop dates in addition to other pertinent events   7/28 admitted for planned esophagectomy. Chest tube was placed on right chest. J tube in place. 2 JP drains in place. Patient was extubated post procedure. Patient developed subcutaneous emphysema. Unable to use J tube due to leakage around site. 8/3: PCCM consulted for vent management after pt brought back to OR for esophageal rupture/ leak. Vanc/zosyn/fluconazole started. Hypotension noted post-op. Felt 2/2 Sepsis; treated w/ volume and pressors.  8/4: Still on norepinephrine.  Central venous pressure suggesting he is still hypovolemic so receiving fluid.  Spontaneous breathing trial initiated but not ready for extubation due to work of breathing.  culture sent from chest tube.  J-tube replaced in interventional radiology due to leaking.  Tube feeds resumed in the afternoon 8/5: J-tube site again leaking, tube feeds placed on hold around midnight.Still pressor dependent . Passed SBT. WOB improved.  Failed extubation and was  reintubated 8/8: J-tube upsized  Antibiotics:  Cefoxitin 7/28-7/29 Cefazolin 7/29 Fluconazole 8/3>> Gentamicin 8/3 Zosyn 8/3>> Vancomycin 8/3-8/7  Interim History / Subjective:  No significant overnight events. Tachypneic on pressure support this morning.  Consent for tracheostomy obtained from family by Dr. Katrinka Blazing  Objective   Blood pressure (!) 105/92, pulse (!) 51, temperature (!) 96.9 F (36.1 C), temperature source Axillary, resp. rate 19, height 5\' 8"  (1.727 m), weight 57.1 kg, SpO2 95 %. CVP:  [0 mmHg-12 mmHg] 7 mmHg  Vent Mode: CPAP;PSV FiO2 (%):  [40 %-50 %] 40 % Set Rate:  [15 bmp] 15 bmp Vt Set:  [450 mL-540 mL] 540 mL PEEP:  [5 cmH20] 5 cmH20 Pressure Support:  [12 cmH20] 12 cmH20 Plateau Pressure:  [20 cmH20-24 cmH20] 23 cmH20   Intake/Output Summary (Last 24 hours) at 09/13/2020 0836 Last data filed at 09/13/2020 0800 Gross per 24 hour  Intake 3561.62 ml  Output 5482 ml  Net -1920.38 ml    Filed Weights   09/11/20 0427 09/12/20 0348 09/13/20 0500  Weight: 59.2 kg 62 kg 57.1 kg    Examination:  General: Elderly gentleman, chronically ill-appearing intubated  HEENT: ETT Pulmonary: on PS, tachypneic, bilateral rales, no significant output from suction Cardiac: bradycardic, no lower extremity edema Abdomen: Soft, J-tube in place with small amount of drainage around the tube Neuro: Sedated on precedex and dilaudid, will arouse to voice and follow simple commands intermittently   Resolved Hospital Problem list   AKI  Assessment & Plan:   Acute respiratory failure w/ hypoxia and hypercarbia requiring mechanical ventilation. HCAP vs pulmonary edema. Vent day #13. Failed extubation 8/5 requiring re-intubation. Failed SBT  this morning CVPs 3-7 Gram negative rods on tracheal aspirate culture Plan: -plan to proceed with tracheostomy this afternoon -VAP bundle -Sedation protocol with precedex and dilaudid. RASS goal -2 to -3 -continue zosyn and  fluconazole -follow cultures  Septic shock 2/2 Esophageal rupture s/p Esophagectomy, polymicrobial, Pseudomonas, E. coli, strep angiosis Remains on levophed Plan: Continue to wean levo for a MAP goal >65 Continue Zosyn plus fluconazole  Bradycardia. Likely medication induced. Normal PR interval on 8/8 EKG. -continue to monitor. Titrate down precedex as needed  Esophageal stricture s/p esophagectomy complicated by post operative esophageal rupture and J-tube leakage J-tube upsized 8/8. Leakage persists however is improved  Minimal output from right chest tube Severe protein calorie malnutrition.   Plan -Continue TPN.  -Management per CT surgery  Post operative anemia, Chronic anemia Hemoglobin down 1g from yesterday. No obvious source of bleeding -Continue to monitor. Transfuse for hemoglobin <7  Hyperbilirubinemia. Liver enzymes normal.  -Monitor  Hyperglycemia. CBGs at goal.  -SSI  Best Practice (right click and "Reselect all SmartList Selections" daily)   Diet/type: NPO DVT prophylaxis: LMWH GI prophylaxis: PPI Lines: Central line, NG, J-tube, PICC, Foley, ETT Foley:  Yes, and it is still needed Code Status:  full code Last date of multidisciplinary goals of care discussion [per primary]  [name], MD Internal Medicine Resident PGY-3 [facility] Pager: [phone] 09/13/2020 8:36 AM     Attending addendum Seen in f/u for respiratory failure Failed SBT desaturating to 60s Pain remains tough to control J tube upsized yesterday, leaking is apparently slightly better Remains on TPN CXR with continued airspace disease Challenge with trickle feeds Proceed with trach to facilitate vent wean, discussed with wife F/u repeat tracheal aspirate, continue abx as ordered Push diuresis Really needs better nutrition and mobilization  34 min cc time Myrla Halsted MD PCCM

## 2020-09-13 NOTE — Progress Notes (Signed)
[address]       [place] [address]             [phone]                 6 Days Post-Op Procedure(s) (LRB): XI ROBOTIC ASSISTED THORASCOPY-DECORTICATION & REVISION OF ESOPHAGECTOMY (Right) ESOPHAGOGASTRODUODENOSCOPY (EGD) (N/A) INTERCOSTAL NERVE BLOCK (Right)   Events: No events. Failed SBT today _______________________________________________________________ Vitals: BP (!) 105/92   Pulse (!) 51   Temp (!) 96.9 F (36.1 C) (Axillary)   Resp 19   Ht 5\' 8"  (1.727 m)   Wt 57.1 kg   SpO2 95%   BMI 19.14 kg/m   - Neuro: awake  - Cardiovascular: Sinus  Drips: Levophed at 8.   CVP:  [0 mmHg-12 mmHg] 7 mmHg  - Pulm:  Vent Mode: CPAP;PSV FiO2 (%):  [40 %-50 %] 40 % Set Rate:  [15 bmp] 15 bmp Vt Set:  [450 mL-540 mL] 540 mL PEEP:  [5 cmH20] 5 cmH20 Pressure Support:  [12 cmH20] 12 cmH20 Plateau Pressure:  [20 cmH20-24 cmH20] 23 cmH20  ABG    Component Value Date/Time   PHART 7.414 09/08/2020 0444   PCO2ART 32.8 09/08/2020 0444   PO2ART 98 09/08/2020 0444   HCO3 20.7 09/08/2020 0444   TCO2 22 09/08/2020 0444   ACIDBASEDEF 3.0 (H) 09/08/2020 0444   O2SAT 97.0 09/08/2020 0444    - Abd: Soft - Extremity: Cool  .Intake/Output      08/08 0701 08/09 0700 08/09 0701 08/10 0700   I.V. (mL/kg) 3558.1 (62.3)    Other 10    IV Piggyback 591.1    Chest Tube     Total Intake(mL/kg) 4159.2 (72.8)    Urine (mL/kg/hr) 4527 (3.3) 1000 (9.8)   Emesis/NG output 150    Drains 75    Chest Tube 80    Total Output 4832 1000   Net -672.8 -1000        Urine Occurrence 2 x       _______________________________________________________________ Labs: CBC Latest Ref Rng & Units 09/13/2020 09/12/2020 09/11/2020  WBC 4.0 - 10.5 K/uL 14.2(H) 15.1(H) 16.9(H)  Hemoglobin 13.0 - 17.0 g/dL 8.2(L) 9.5(L) 10.1(L)  Hematocrit 39.0 - 52.0 % 25.4(L) 28.3(L) 30.3(L)  Platelets 150 - 400 K/uL 265 206 198   CMP Latest Ref Rng & Units 09/13/2020 09/12/2020 09/11/2020   Glucose 70 - 99 mg/dL 11/11/2020) 462(V) 035(K)  BUN 6 - 20 mg/dL 12 8 6   Creatinine 0.61 - 1.24 mg/dL 093(G) ) 1.82(X)  Sodium 135 - 145 mmol/L 134(L) 134(L) 132(L)  Potassium 3.5 - 5.1 mmol/L 3.8 3.5 3.5  Chloride 98 - 111 mmol/L 93(L) 96(L) 98  CO2 22 - 32 mmol/L 32 30 29  Calcium 8.9 - 10.3 mg/dL 9.37(J) 7.9(L) 7.8(L)  Total Protein 6.5 - 8.1 g/dL - 4.2(L) -  Total Bilirubin 0.3 - 1.2 mg/dL - 1.8(H) -  Alkaline Phos 38 - 126 U/L - 58 -  AST 15 - 41 U/L - 25 -  ALT 0 - 44 U/L - 15 -    CXR: -  _______________________________________________________________  Assessment and Plan: POD 11 s/p robotic Ivor Lewis esophagectomy, postop day 5 status post reexploration with reinforcement of anastomosis.  Neuro: Wean sedation as tolerated CV: Wean Levophed as tolerated Pulm: will plan to trach later this week.Appreciate pulmonary critical care. Renal: Creatinine stable. GI:  On TPN.  Will see about upsizing drain next week Heme: Stable ID: On  Vanco Zosyn and Diflucan day 6 Endo: Sliding scale insulin Dispo: Continue ICU care.   Corliss Skains 09/13/2020 8:47 AM

## 2020-09-13 NOTE — Plan of Care (Signed)
  Problem: Education: Goal: Knowledge of the prescribed therapeutic regimen will improve Outcome: Progressing   Problem: Bowel/Gastric: Goal: Gastrointestinal status for postoperative course will improve Outcome: Progressing   Problem: Nutritional: Goal: Ability to achieve adequate nutritional intake will improve Outcome: Progressing   Problem: Respiratory: Goal: Ability to maintain a clear airway will improve Outcome: Progressing

## 2020-09-13 NOTE — Progress Notes (Signed)
PT Cancellation Note  Patient Details Name: Ralph [name] MRN: [id number] DOB: 10/29/1965   Cancelled Treatment:    Reason Eval/Treat Not Completed: Patient at procedure or test/unavailable;Medical issues which prohibited therapy;Patient not medically ready.  Pt getting a trach presently.  Pt sedated,  RN asks to hold again for possible restart 8/10. 09/13/2020  Jacinto Halim., PT Acute Rehabilitation Services [phone]  (pager) [phone]  (office)   Eliseo Gum Sagar Tengan 09/13/2020, 12:06 PM

## 2020-09-13 NOTE — Procedures (Signed)
Bedside Tracheostomy Insertion Procedure Note   Patient Details:   Name: Ralph [name] DOB: 1965/05/07 MRN: [id number]  Procedure: Tracheostomy  Pre Procedure Assessment: ET Tube Size: ET Tube secured at lip (cm): Bite block in place: Yes Breath Sounds: Rhonch  Post Procedure Assessment: BP (!) 96/56   Pulse 64   Temp (!) 96.9 F (36.1 C) (Axillary)   Resp 15   Ht 5\' 8"  (1.727 m)   Wt 57.1 kg   SpO2 99%   BMI 19.14 kg/m  O2 sats: stable throughout Complications: No apparent complications Patient did tolerate procedure well Tracheostomy Brand:Shiley Tracheostomy Style:Cuffed Tracheostomy Size:  8 Tracheostomy Secured & velcro trach ties.  Tracheostomy Placement Confirmation:Trach cuff visualized and in place and Chest X ray ordered for placement    Marline Morace L 09/13/2020, 10:40 AM

## 2020-09-13 NOTE — Telephone Encounter (Signed)
Called and discussed.  Ralph [name] has been quite ill.  He is in the ICU with a trach.  After discussion, I recommended that she request a palliative care consult.

## 2020-09-13 NOTE — Progress Notes (Signed)
Pharmacy Antibiotic Note  IGOR BISHOP is a 55 y.o. male admitted on 09/01/2020 for planned esophagectomy. Post-op with bilious drainage around J-tube and increased NGT output - back to OR 8/3 for endoscopy and re-exploration with findings of esophageal anastomotic leak. S/p exchange of jejunostomy on 8/8. Pharmacy has been consulted for Zosyn + Fluconazole dosing.  Vancomycin stopped 8/8. On antibiotic day #7. WBC 14.2, Afebrile. Trach aspirate yesterday growing mod GNR. Pleural tissue cx 8/3 growing E Coli, strep anginosis, and pseudomonas.    Plan: - Zosyn 3.375g IV EI every 8 hours - Fluconazole 400 mg IV every 24 hours - F/u LOT, cx results, clinical pic  Height: 5\' 8"  (172.7 cm) Weight: 57.1 kg (125 lb 14.1 oz) IBW/kg (Calculated) : 68.4  Temp (24hrs), Avg:98.4 F (36.9 C), Min:96.9 F (36.1 C), Max:100.3 F (37.9 C)  Recent Labs  Lab 09/09/20 0409 09/10/20 0332 09/10/20 2106 09/11/20 0449 09/11/20 0830 09/12/20 0345 09/13/20 0539  WBC 22.0* 15.1*  --   --  16.9* 15.1* 14.2*  CREATININE 0.57* 0.57*  --  0.46*  --  0.53* 0.45*  VANCOTROUGH  --   --  4*  --   --   --   --      Estimated Creatinine Clearance: 87.2 mL/min (A) (by C-G formula based on SCr of 0.45 mg/dL (L)).    No Known Allergies  Antimicrobials this admission: Cefoxitin 7/28>7/29 Vancomycin 8/3 > 8/8 Zosyn 8/3 >  Fluconazole 8/3 >   Dose adjustments this admission: N/A  Microbiology results: 8/8 Trach aspirate: mod GNR   8/4 R pleural fluid > ecoli pan sens, pesuedomonas, streptococcus grp F - pan sens 8/3 Fungal Cx > sent  Thank you for allowing pharmacy to be a part of this patient's care.  10/4, PharmD, BCCCP Clinical Pharmacist  Phone: [phone] 09/13/2020 10:57 AM  Please check AMION for all [facility] Pharmacy phone numbers After 10:00 PM, call Main Pharmacy [phone]

## 2020-09-13 NOTE — Procedures (Signed)
Percutaneous Tracheostomy Procedure Note   Ralph [name]  [id number]  11-May-1965  Date:09/13/20  Time:11:50 AM   Provider Performing:Dameshia Seybold  Procedure: Percutaneous Tracheostomy with Bronchoscopic Guidance (47425)  Indication(s) Hypercapnic and hypoxic respiratory failure  Consent Risks of the procedure as well as the alternatives and risks of each were explained to the patient and/or caregiver.  Consent for the procedure was obtained.  Anesthesia Etomidate, Versed, Fentanyl, Vecuronium   Time Out Verified patient identification, verified procedure, site/side was marked, verified correct patient position, special equipment/implants available, medications/allergies/relevant history reviewed, required imaging and test results available.   Sterile Technique Maximal sterile technique including sterile barrier drape, hand hygiene, sterile gown, sterile gloves, mask, hair covering.    Procedure Description Appropriate anatomy identified by palpation.  Patient's neck prepped and draped in sterile fashion.  1% lidocaine with epinephrine was used to anesthetize skin overlying neck.  1.5cm incision made and blunt dissection performed until tracheal rings could be easily palpated.   Then a size 8 Shiley tracheostomy was placed under bronchoscopic visualization using usual Seldinger technique and serial dilation.   Bronchoscope confirmed placement above the carina.  Tracheostomy was sutured in place with adhesive pad to protect skin under pressure.    Patient connected to ventilator.   Complications/Tolerance None; patient tolerated the procedure well. Chest X-ray is ordered to confirm no post-procedural complication.   EBL Minimal   Specimen(s) None    Ralph Radon, MD Internal Medicine Resident PGY-3 [name] Pager: [phone] 09/13/2020 11:50 AM

## 2020-09-13 NOTE — Procedures (Signed)
Diagnostic Bronchoscopy  Ralph [name]  [id number]  10-15-1965  Date:09/13/20  Time:11:39 AM   Provider Performing:Roald Lukacs R Oddie Kuhlmann   Procedure: Diagnostic Bronchoscopy (53664)  Indication(s) Assist with direct visualization of tracheostomy placement  Consent Risks of the procedure as well as the alternatives and risks of each were explained to the patient and/or caregiver.  Consent for the procedure was obtained.   Anesthesia See separate tracheostomy note   Time Out Verified patient identification, verified procedure, site/side was marked, verified correct patient position, special equipment/implants available, medications/allergies/relevant history reviewed, required imaging and test results available.   Sterile Technique Usual hand hygiene, masks, gowns, and gloves were used   Procedure Description Bronchoscope advanced through endotracheal tube and into airway.  After suctioning out tracheal secretions, bronchoscope used to provide direct visualization of tracheostomy placement.   Complications/Tolerance None; patient tolerated the procedure well.   EBL None  Specimen(s) None   Ralph Gasman Ellary Casamento, PA-C

## 2020-09-14 ENCOUNTER — Inpatient Hospital Stay (HOSPITAL_COMMUNITY): Payer: Medicaid Other

## 2020-09-14 DIAGNOSIS — J9601 Acute respiratory failure with hypoxia: Secondary | ICD-10-CM | POA: Diagnosis not present

## 2020-09-14 LAB — GLUCOSE, CAPILLARY
Glucose-Capillary: 135 mg/dL — ABNORMAL HIGH (ref 70–99)
Glucose-Capillary: 113 mg/dL — ABNORMAL HIGH (ref 70–99)
Glucose-Capillary: 128 mg/dL — ABNORMAL HIGH (ref 70–99)
Glucose-Capillary: 134 mg/dL — ABNORMAL HIGH (ref 70–99)
Glucose-Capillary: 123 mg/dL — ABNORMAL HIGH (ref 70–99)

## 2020-09-14 LAB — AEROBIC/ANAEROBIC CULTURE W GRAM STAIN (SURGICAL/DEEP WOUND)

## 2020-09-14 LAB — COMPREHENSIVE METABOLIC PANEL
ALT: 51 U/L — ABNORMAL HIGH (ref 0–44)
AST: 77 U/L — ABNORMAL HIGH (ref 15–41)
Albumin: 2.1 g/dL — ABNORMAL LOW (ref 3.5–5.0)
Alkaline Phosphatase: 94 U/L (ref 38–126)
Anion gap: 7 (ref 5–15)
BUN: 16 mg/dL (ref 6–20)
CO2: 36 mmol/L — ABNORMAL HIGH (ref 22–32)
Calcium: 8.4 mg/dL — ABNORMAL LOW (ref 8.9–10.3)
Chloride: 93 mmol/L — ABNORMAL LOW (ref 98–111)
Creatinine, Ser: 0.61 mg/dL (ref 0.61–1.24)
GFR, Estimated: >60 mL/min (ref >60)
Glucose, Bld: 115 mg/dL — ABNORMAL HIGH (ref 70–99)
Potassium: 3.7 mmol/L (ref 3.5–5.1)
Sodium: 136 mmol/L (ref 135–145)
Total Bilirubin: 2.9 mg/dL — ABNORMAL HIGH (ref 0.3–1.2)
Total Protein: 5 g/dL — ABNORMAL LOW (ref 6.5–8.1)

## 2020-09-14 LAB — CULTURE, RESPIRATORY W GRAM STAIN

## 2020-09-14 LAB — CBC
HCT: 26.5 % — ABNORMAL LOW (ref 39.0–52.0)
Hemoglobin: 8.6 g/dL — ABNORMAL LOW (ref 13.0–17.0)
MCH: 30.2 pg (ref 26.0–34.0)
MCHC: 32.5 g/dL (ref 30.0–36.0)
MCV: 93 fL (ref 80.0–100.0)
Platelets: 380 10*3/uL (ref 150–400)
RBC: 2.85 MIL/uL — ABNORMAL LOW (ref 4.22–5.81)
RDW: 15.7 % — ABNORMAL HIGH (ref 11.5–15.5)
WBC: 15 10*3/uL — ABNORMAL HIGH (ref 4.0–10.5)
nRBC: 0 % (ref 0.0–0.2)

## 2020-09-14 LAB — MAGNESIUM: Magnesium: 1.8 mg/dL (ref 1.7–2.4)

## 2020-09-14 MED ORDER — TOBRAMYCIN 300 MG/5ML IN NEBU
300.0000 mg | INHALATION_SOLUTION | Freq: Two times a day (BID) | RESPIRATORY_TRACT | Status: AC
  Administered 2020-09-14 – 2020-09-29 (×30): 300 mg via RESPIRATORY_TRACT
  Filled 2020-09-14 (×38): qty 5

## 2020-09-14 MED ORDER — QUETIAPINE FUMARATE 25 MG PO TABS
25.0000 mg | ORAL_TABLET | Freq: Two times a day (BID) | ORAL | Status: DC
  Administered 2020-09-14 (×2): 25 mg
  Filled 2020-09-14 (×2): qty 1

## 2020-09-14 MED ORDER — METHYLNALTREXONE BROMIDE 12 MG/0.6ML ~~LOC~~ SOLN
12.0000 mg | Freq: Once | SUBCUTANEOUS | Status: AC
  Administered 2020-09-14: 12 mg via SUBCUTANEOUS
  Filled 2020-09-14: qty 0.6

## 2020-09-14 MED ORDER — SODIUM CHLORIDE 0.9 % IV SOLN
0.5000 mg/h | INTRAVENOUS | Status: DC
  Administered 2020-09-14: 1 mg/h via INTRAVENOUS
  Filled 2020-09-14: qty 20

## 2020-09-14 MED ORDER — MIDODRINE HCL 5 MG PO TABS
10.0000 mg | ORAL_TABLET | Freq: Three times a day (TID) | ORAL | Status: DC
  Administered 2020-09-14 – 2020-10-25 (×123): 10 mg via JEJUNOSTOMY
  Filled 2020-09-14 (×124): qty 2

## 2020-09-14 MED ORDER — TRACE MINERALS CU-MN-SE-ZN 300-55-60-3000 MCG/ML IV SOLN
INTRAVENOUS | Status: AC
  Filled 2020-09-14: qty 742.4

## 2020-09-14 MED ORDER — TRACE MINERALS CU-MN-SE-ZN 300-55-60-3000 MCG/ML IV SOLN: INTRAVENOUS | Status: DC

## 2020-09-14 MED ORDER — SODIUM CHLORIDE 0.9 % IV SOLN
2.0000 g | Freq: Three times a day (TID) | INTRAVENOUS | Status: AC
  Administered 2020-09-14 – 2020-09-21 (×21): 2 g via INTRAVENOUS
  Filled 2020-09-14 (×21): qty 2

## 2020-09-14 MED ORDER — SODIUM CHLORIDE 3 % IN NEBU
4.0000 mL | INHALATION_SOLUTION | Freq: Two times a day (BID) | RESPIRATORY_TRACT | Status: AC
  Administered 2020-09-14 – 2020-09-16 (×5): 4 mL via RESPIRATORY_TRACT
  Filled 2020-09-14 (×6): qty 4

## 2020-09-14 MED ORDER — FUROSEMIDE 40 MG PO TABS
40.0000 mg | ORAL_TABLET | Freq: Every day | ORAL | Status: DC
  Administered 2020-09-14 – 2020-09-17 (×4): 40 mg
  Filled 2020-09-14 (×4): qty 1

## 2020-09-14 MED ORDER — VITAL 1.5 CAL PO LIQD
1000.0000 mL | ORAL | Status: DC
  Administered 2020-09-14: 1000 mL via JEJUNOSTOMY

## 2020-09-14 MED ORDER — CLONAZEPAM 1 MG PO TABS
1.0000 mg | ORAL_TABLET | Freq: Two times a day (BID) | ORAL | Status: DC
  Administered 2020-09-14 – 2020-09-27 (×27): 1 mg
  Filled 2020-09-14 (×28): qty 1

## 2020-09-14 MED ORDER — HYDROMORPHONE HCL 2 MG PO TABS
4.0000 mg | ORAL_TABLET | ORAL | Status: DC
Start: 2020-09-14 — End: 2020-09-22
  Administered 2020-09-14 – 2020-09-22 (×49): 4 mg
  Filled 2020-09-14 (×49): qty 2

## 2020-09-14 MED ORDER — MAGNESIUM SULFATE 2 GM/50ML IV SOLN
2.0000 g | Freq: Once | INTRAVENOUS | Status: AC
  Administered 2020-09-14: 2 g via INTRAVENOUS
  Filled 2020-09-14: qty 50

## 2020-09-14 MED ORDER — POTASSIUM CHLORIDE 20 MEQ PO PACK
40.0000 meq | PACK | Freq: Once | ORAL | Status: AC
  Administered 2020-09-14: 40 meq
  Filled 2020-09-14: qty 2

## 2020-09-14 NOTE — Progress Notes (Addendum)
Nutrition Follow-up  DOCUMENTATION CODES:   Underweight, Severe malnutrition in context of chronic illness  INTERVENTION:   Tube Feeding via J-tube: Vital 1.5 at 60 ml/hr Pro-Source TF 45 mL BID Provides 119 g of protein, 2240 kcals and 1094 mL of free water  If tolerates titration of TF to goal rate today, recommend discontinuing TPN tomorrow  Remains constipated x 1 week; received dose of relistor today as well as continued bowel regimen. If no results today, consider adjusting bowel regimen-addition of enema/suppository  NUTRITION DIAGNOSIS:   Severe Malnutrition related to chronic illness (recurrent esophageal stricture s/p J-tube) as evidenced by severe muscle depletion, severe fat depletion.  Being addressed via TF   GOAL:   Patient will meet greater than or equal to 90% of their needs  Progressing  MONITOR:   Vent status, Labs, Weight trends, Skin, I & O's, Other (Comment) (TPN)  REASON FOR ASSESSMENT:   Consult Enteral/tube feeding initiation and management  ASSESSMENT:   55 year old male who presented on 7/28 for esophagogastroduodenoscopy and robotic assisted esophagectomy. PMH of esophageal stricture s/p multiple dilations and J-tube placement in April 2022, stroke, CAD, MI, anxiety.   7/30 - tube feeds held due to drainage around J-tube 7/31 - tube feeds resumed at lower rate 8/01 - swallow study negative 8/02 - tube feeds held due to drainage around J-tube 8/03 - intubated, s/p esophagoscopy, thoracoscopy, decortication, and reinforcement of esophageal anastomosis 8/04 - s/p J-tube replacement in IR, tube feeds resumed 8/05 - tube feeds held due to drainage around new J-tube, extubated, later reintubated, TPN initiated 8/08 - J-tube upsized 8/08 - Trach placed, trickle TF resumed   Pt remains on vent support via trach  Tolerating Vital 1.5 at 20 ml/hr via J-tube with minimal drainage. RD observed dressing which appeared dry with no GI contents or  TF, no leakage observed around tube either  TPN changed to cyclic today, continues to meet 100% estimated needs  Current wt 55.6 kg  Constipated; continues without BM x 1 week  Labs: reviewed Meds: colace, lasix, reglan, miralax   Diet Order:   Diet Order             Diet NPO time specified  Diet effective midnight                   EDUCATION NEEDS:   No education needs have been identified at this time  Skin:  Skin Assessment: Skin Integrity Issues: Skin Integrity Issues:: DTI, Incisions DTI: sacrum Incisions: abdoment, R chest x 2  Last BM:  09/06/20 large type 7/type 3  Height:   Ht Readings from Last 1 Encounters:  09/07/20 5\' 8"  (1.727 m)    Weight:   Wt Readings from Last 1 Encounters:  09/14/20 55.6 kg     BMI:  Body mass index is 18.64 kg/m.  Estimated Nutritional Needs:   Kcal:  1950-2250 kcals  Protein:  110-125 g  Fluid:  >/= 2.0 L   11/14/20 MS, RDN, LDN, CNSC Registered Dietitian III Clinical Nutrition RD Pager and On-Call Pager Number Located in [place]

## 2020-09-14 NOTE — Progress Notes (Signed)
SLP Cancellation Note  Patient Details Name: Ralph [name] MRN: [id number] DOB: May 24, 1965   Cancelled treatment:       Reason Eval/Treat Not Completed: Medical issues which prohibited therapy. Chart reviewed and discussed pt with RN today. He is sedated this morning; team is working on decreasing this. He is not ready yet for PMV, but we will continue to follow. Would also await medical clearance prior to performing any swallowing evaluation.     Mahala Menghini., M.A. CCC-SLP Acute Rehabilitation Services Pager [phone] Office [phone]  09/14/2020, 12:57 PM

## 2020-09-14 NOTE — Evaluation (Signed)
Physical Therapy RE-Evaluation Patient Details Name: Ralph [name] MRN: [id number] DOB: December 08, 1965 Today's Date: 09/14/2020   History of Present Illness  pt is a 55 y/o male admitted 7/28 for an Ivor-Lewis esophagectomy due to benign lower esophageal stricture and protein malnutrition.   */3 PCCM cosulted for vent mgmt post back to OR for esophageal rupture.  8/9 Tracheostomy.  PMHx: anxiety, CAD, MI, Stroke, multiple ballon dilations to esophageal stricture.  Clinical Impression  Pt newly trached 8/9.  He is very restless and UE's are restrained, but pt cleared to work with therapies.  Emphasis on warm up exercise, transitions to EOB, sitting balance at EOB, sit to stand at EOB while staff cleans pt's bedding and significantly assist steps up toward [facility] before transition back to supine.  VSS on vent.     Follow Up Recommendations Home health PT;Supervision/Assistance - 24 hour;Other (comment) (If slow to progress from vent support may need SNF  TBA)    Equipment Recommendations  Rolling walker with 5" wheels;Other (comment) (TBD)    Recommendations for Other Services       Precautions / Restrictions Precautions Precautions: Fall;Other (comment) (Trach) Precaution Comments: PEG tube, NG tube, chest tube, trach      Mobility  Bed Mobility Overal bed mobility: Needs Assistance Bed Mobility: Rolling;Sidelying to Sit;Sit to Supine Rolling: Max assist;+2 for physical assistance Sidelying to sit: Max assist;+2 for physical assistance Supine to sit: Max assist;+2 for physical assistance     General bed mobility comments: cued for direction, truncal/LE assist for transition to sitting EOB/ to supine    Transfers Overall transfer level: Needs assistance   Transfers: Sit to/from Stand Sit to Stand: Max assist;+2 safety/equipment         General transfer comment: cues for direction, face to face assist to stand and attain full upright stance  Ambulation/Gait Ambulation/Gait  assistance: Max assist Gait Distance (Feet): 3 Feet   Gait Pattern/deviations: Step-to pattern     General Gait Details: side step with maximal assist to scoot feet L to step toward [facility]  Stairs            Wheelchair Mobility    Modified Rankin (Stroke Patients Only)       Balance Overall balance assessment: Needs assistance Sitting-balance support: Feet supported;Single extremity supported;Bilateral upper extremity supported Sitting balance-Leahy Scale: Poor Sitting balance - Comments: pt attempting to use UE's for assist, but generally listing L, but improving from heavy to light lean over course of treatment   Standing balance support: Bilateral upper extremity supported;During functional activity Standing balance-Leahy Scale: Poor Standing balance comment: pt reliant on external support.                             Pertinent Vitals/Pain Pain Assessment: Faces Faces Pain Scale: Hurts even more Pain Location: general, ?abdomen Pain Descriptors / Indicators: Grimacing;Guarding (shaking) Pain Intervention(s): Monitored during session    Home Living                        Prior Function                 Hand Dominance        Extremity/Trunk Assessment                Communication      Cognition Arousal/Alertness: Awake/alert;Lethargic Behavior During Therapy: Anxious;Restless Overall Cognitive Status: Difficult to assess  General Comments      Exercises Other Exercises Other Exercises: warm up hip/knee ROM exercise with graded assist and graded resistance. Other Exercises: Non isolated bicep/tricep flex/ext with graded assist/resistance x10.   Assessment/Plan    PT Assessment    PT Problem List Decreased strength;Decreased activity tolerance;Decreased balance;Decreased mobility;Decreased knowledge of use of DME;Pain       PT Treatment Interventions      PT  Goals (Current goals can be found in the Care Plan section)  Acute Rehab PT Goals Patient Stated Goal: home PT Goal Formulation: With patient Time For Goal Achievement: 09/19/20 Potential to Achieve Goals: Fair    Frequency Min 3X/week   Barriers to discharge        Co-evaluation               AM-PAC PT "6 Clicks" Mobility  Outcome Measure Help needed turning from your back to your side while in a flat bed without using bedrails?: A Lot Help needed moving from lying on your back to sitting on the side of a flat bed without using bedrails?: Total Help needed moving to and from a bed to a chair (including a wheelchair)?: Total Help needed standing up from a chair using your arms (e.g., wheelchair or bedside chair)?: Total Help needed to walk in hospital room?: Total Help needed climbing 3-5 steps with a railing? : Total 6 Click Score: 7    End of Session Equipment Utilized During Treatment: Oxygen;Other (comment) (trached on vent) Activity Tolerance: Patient tolerated treatment well;Patient limited by fatigue Patient left: in bed;with call bell/phone within reach Nurse Communication: Mobility status PT Visit Diagnosis: Other abnormalities of gait and mobility (R26.89);Muscle weakness (generalized) (M62.81);Difficulty in walking, not elsewhere classified (R26.2) Pain - part of body:  (abdomen/flank)    Time: 0981-1914 PT Time Calculation (min) (ACUTE ONLY): 23 min   Charges:   PT Evaluation $PT Re-evaluation: 1 Re-eval PT Treatments $Therapeutic Activity: 8-22 mins        09/14/2020  Jacinto Halim., PT Acute Rehabilitation Services [phone]  (pager) [phone]  (office)  Eliseo Gum Ajmal Kathan 09/14/2020, 6:10 PM

## 2020-09-14 NOTE — Progress Notes (Signed)
Notified Smith MD of leaking J tube. Tube feeds held.

## 2020-09-14 NOTE — Progress Notes (Signed)
[address]       [place] [address]             [phone]                 7 Days Post-Op Procedure(s) (LRB): XI ROBOTIC ASSISTED THORASCOPY-DECORTICATION & REVISION OF ESOPHAGECTOMY (Right) ESOPHAGOGASTRODUODENOSCOPY (EGD) (N/A) INTERCOSTAL NERVE BLOCK (Right)   Events: No events. Trach yesterday. _______________________________________________________________ Vitals: BP (!) 92/58   Pulse 89   Temp 98.7 F (37.1 C) (Oral)   Resp 18   Ht 5\' 8"  (1.727 m)   Wt 55.6 kg   SpO2 92%   BMI 18.64 kg/m   - Neuro: awake  - Cardiovascular: Sinus  Drips: Levophed at 6.   CVP:  [2 mmHg-22 mmHg] 9 mmHg  - Pulm:  Vent Mode: PRVC FiO2 (%):  [40 %-50 %] 40 % Set Rate:  [15 bmp] 15 bmp Vt Set:  [540 mL] 540 mL PEEP:  [5 cmH20] 5 cmH20 Plateau Pressure:  [18 cmH20-33 cmH20] 21 cmH20  ABG    Component Value Date/Time   PHART 7.414 09/08/2020 0444   PCO2ART 32.8 09/08/2020 0444   PO2ART 98 09/08/2020 0444   HCO3 20.7 09/08/2020 0444   TCO2 22 09/08/2020 0444   ACIDBASEDEF 3.0 (H) 09/08/2020 0444   O2SAT 97.0 09/08/2020 0444    - Abd: Soft - Extremity: Cool  .Intake/Output      08/09 0701 08/10 0700 08/10 0701 08/11 0700   I.V. (mL/kg) 3678.4 (66.2) 1644.7 (29.6)   Other 230 160   NG/GT 275.7 428.8   IV Piggyback 440.2 100.1   Total Intake(mL/kg) 4624.3 (83.2) 2333.6 (42)   Urine (mL/kg/hr) 6200 (4.6) 675 (1.3)   Emesis/NG output 200    Drains 70 0   Chest Tube 10 40   Total Output 6480 715   Net -1855.7 +1618.6           _______________________________________________________________ Labs: CBC Latest Ref Rng & Units 09/14/2020 09/13/2020 09/12/2020  WBC 4.0 - 10.5 K/uL 15.0(H) 14.2(H) 15.1(H)  Hemoglobin 13.0 - 17.0 g/dL 11/12/2020) 2.0(B) 5.5(H)  Hematocrit 39.0 - 52.0 % 26.5(L) 25.4(L) 28.3(L)  Platelets 150 - 400 K/uL 380 265 206   CMP Latest Ref Rng & Units 09/14/2020 09/13/2020 09/12/2020  Glucose 70 - 99 mg/dL 11/12/2020) 638(G) 536(I)   BUN 6 - 20 mg/dL 16 12 8   Creatinine 0.61 - 1.24 mg/dL 680(H ) 2.12)  Sodium 135 - 145 mmol/L 136 134(L) 134(L)  Potassium 3.5 - 5.1 mmol/L 3.7 3.8 3.5  Chloride 98 - 111 mmol/L 93(L) 93(L) 96(L)  CO2 22 - 32 mmol/L 36(H) 32 30  Calcium 8.9 - 10.3 mg/dL 2.48(G) 5.00(B) 7.9(L)  Total Protein 6.5 - 8.1 g/dL 5.0(L) - 4.2(L)  Total Bilirubin 0.3 - 1.2 mg/dL 2.9(H) - 1.8(H)  Alkaline Phos 38 - 126 U/L 94 - 58  AST 15 - 41 U/L 77(H) - 25  ALT 0 - 44 U/L 51(H) - 15    CXR: -  _______________________________________________________________  Assessment and Plan: POD 12 s/p robotic Ivor Lewis esophagectomy, postop day 6 status post reexploration with reinforcement of anastomosis.  Neuro: Wean sedation as tolerated CV: Wean Levophed as tolerated Pulm: Continue trach care. Renal: Creatinine stable. GI:  On TPN.  J-tube is functioning.  On trickle feeds and slowly ramping up.   Heme: Stable ID: Antibiotics changed and given cultures we will treat with Diflucan for total of 10 days. Endo: Sliding scale insulin  Dispo: Continue ICU care.   Corliss Skains 09/14/2020 4:23 PM

## 2020-09-14 NOTE — Progress Notes (Signed)
PHARMACY - TOTAL PARENTERAL NUTRITION CONSULT NOTE   Indication:  intolerance to enteral feeding  Patient Measurements: Height: 5\' 8"  (172.7 cm) Weight: 55.6 kg (122 lb 9.2 oz) IBW/kg (Calculated) : 68.4 TPN AdjBW (KG): 61 Body mass index is 18.64 kg/m. Usual Weight: 52 kg  Assessment:  54 yom with severe malnutrition and hx benign esophageal stricture with J-tube who is s/p planned robotic Ivor Lewis esophagectomy on 7/28. TF were held due to drainage around J-tube on 7/30, resumed at lower rate on 7/31. TF held again on 8/2 for continued drainage around J-tube. He was taken back to the OR on 8/3 for revision of anastomotic leak. J-tube replaced in IR on 8/4 but continues to have drainage. Significant refeeding risk noted. Pharmacy consulted for TPN.   Glucose / Insulin: no hx DM, CBGs controlled on SSI, 6 units used in last 24h Electrolytes: Na 136, Cl low 93, CO2 high 36, K 3.7 (Lasix given), Mg 1.8, Phos 3.8 on 8/8, CoCa 9.9 Renal: SCr <1 Hepatic: LFTs starting to increase, Tbili up to 2.9, albumin up 2.1, prealbumin <5 Intake / Output; MIVF: UOP 6.2 L out on lasix 40 mg IV x 3 doses, D5LR at 45 ml/hr, NGT O/P 200, drain O/P 70 ml, chest tube O/P 10 ml, net +6L, LBM 8/2 - Relgan 10 q6h  GI Imaging: 8/3 CT abd/pelvis: small gas-filled collection on the left side of the mediastinum probably represents a small pocket at the esophageal-gastric anastomosis, large amount of pneumoperitoneum GI Surgeries / Procedures:  8/4 J-tube replaced in IR  Central access: double lumen CVC 09/01/20, triple lumen PICC 8/5 TPN start date: 09/09/20  Nutritional Goals (per RD recommendation on 8/5): kCal: 2100-2300, Protein: 100-120 g, Fluid: >= 2 L Goal TPN rate is 95 mL/hr (provides 112 g of protein and 2216 kcals per day)  Current Nutrition:  TPN and Tube feeding - Vital 1.5 at trickle 20  mL/hr via J-tube *Per CCM, plan to advance to goal TF's today if tolerated  Plan:  -Change to cyclic TPN at  10/5 PM to help reduce liver stimulation and concentrate to help reduce volume.  Infuse 1920 mL over 18 hrs : 56 mL/hr x 1 hr, then 113 mL/hr x 16 hrs, then 56 mL/hr x 1 hr. -This TPN provides 112 g of protein, 324 g of dextrose, and 68.4 g of lipids for a total of 2230 kcals meeting 100% of patient needs -Electrolytes in TPN: adjusted for new volume - Na  80 mEq/L, K 70 mEq/L, Ca 6 mEq/L, Mg 7 mEq/L, and Phos 18 mmol/L. Maximizing Cl in TPN.  Add standard MVI and trace elements to TPN Continue TCTS SSI q4h SSI and adjust as needed  Monitor TPN labs on Mon/Thurs Recheck BMP and Mag in AM  Give KCl 6644 per tube x1 dose and Magnesium 2g IV x1. If further diuresis, recommend considering another of KCl in PM.   Thank you for involving pharmacy in this patient's care.  , PharmD, BCPS, BCCCP Clinical Pharmacist Please refer to [facility] for [facility] Pharmacy numbers 09/14/2020, 7:12 AM

## 2020-09-14 NOTE — Progress Notes (Addendum)
NAME:  Ralph [name], MRN:  [id number], DOB:  [birth year], LOS: 13 ADMISSION DATE:  09/01/2020, CONSULTATION DATE:  8/3 REFERRING MD:  Dr. Cliffton Asters, CHIEF COMPLAINT:  esophageal rupture esophageal stricture   History of Present Illness:  Patient is a 55 yo M w/ PMH anxiety, CAD, MI, stroke presents to [facility] on 7/28 for esophagectomy w/ Dr. Cliffton Asters for esophageal stricture.  On 7/28, Dr. Cliffton Asters performed the esophagectomy without complication.  8/3 patient taken back to OR for esophageal rupture s/p esophagectomy by Dr. Cliffton Asters. Unable to extubate post procedure. Patient remains intubated on mechanical ventilation. ABG with respiratory acidosis.   PCCM consulted for vent management.  Pertinent  Medical History   Past Medical History:  Diagnosis Date   Anxiety    Coronary artery disease    Myocardial infarction [facility])    Stroke (HCC)    Significant Hospital Events: Including procedures, antibiotic start and stop dates in addition to other pertinent events   7/28 admitted for planned esophagectomy. Chest tube was placed on right chest. J tube in place. 2 JP drains in place. Patient was extubated post procedure. Patient developed subcutaneous emphysema. Unable to use J tube due to leakage around site. 8/3: PCCM consulted for vent management after pt brought back to OR for esophageal rupture/ leak. Vanc/zosyn/fluconazole started. Hypotension noted post-op. Felt 2/2 Sepsis; treated w/ volume and pressors.  8/4: Still on norepinephrine.  Central venous pressure suggesting he is still hypovolemic so receiving fluid.  Spontaneous breathing trial initiated but not ready for extubation due to work of breathing.  culture sent from chest tube.  J-tube replaced in interventional radiology due to leaking.  Tube feeds resumed in the afternoon 8/5: J-tube site again leaking, tube feeds placed on hold around midnight.Still pressor dependent . Passed SBT. WOB improved.  Failed extubation and was  reintubated 8/8: J-tube upsized 8/9: Tracheostomy  Antibiotics:  Cefoxitin 7/28-7/29 Cefazolin 7/29 Fluconazole 8/3>> Gentamicin 8/3 Zosyn 8/3>>8/9 Vancomycin 8/3-8/7 Ceftazidime 8/10>>  Interim History / Subjective:  No significant overnight events.   Objective   Blood pressure (!) 84/50, pulse 61, temperature 98.7 F (37.1 C), temperature source Oral, resp. rate 19, height 5\' 8"  (1.727 m), weight 55.6 kg, SpO2 94 %. CVP:  [2 mmHg-22 mmHg] 9 mmHg  Vent Mode: PRVC FiO2 (%):  [40 %-100 %] 40 % Set Rate:  [15 bmp] 15 bmp Vt Set:  [540 mL] 540 mL PEEP:  [5 cmH20] 5 cmH20 Plateau Pressure:  [18 cmH20-33 cmH20] 33 cmH20   Intake/Output Summary (Last 24 hours) at 09/14/2020 0852 Last data filed at 09/14/2020 0800 Gross per 24 hour  Intake 4450.29 ml  Output 5480 ml  Net -1029.71 ml    Filed Weights   09/12/20 0348 09/13/20 0500 09/14/20 0500  Weight: 62 kg 57.1 kg 55.6 kg    Examination:  General: Elderly gentleman, chronically ill-appearing intubated  HEENT: tracheostomy Pulmonary:  on full vent support. Copious secretions present on suctioning. Rhonchorous breath sounds throughout Cardiac: RRR Abdomen: Soft, J-tube in place with small amount of drainage around the tube Neuro: Sedated on precedex and dilaudid, will arouse to voice and follow simple commands intermittently   Assessment & Plan:   Acute respiratory failure w/ hypoxia and hypercarbia requiring mechanical ventilation.  S/p tracheostomy 8/9 for inability to wean from vent Pan-sensitive pseudomonas pneumonia Plan: -d/c zosyn; start ceftazadine -daily SBT, VAP bundle -work towards vent wean -start klonapin and seroquel per tube. Hopeful to be able to start weaning precedex soon  Esophageal  stricture s/p esophagectomy complicated by post operative esophageal rupture and J-tube leakage J-tube upsized 8/8. Leakage persists however is improved  Minimal output from right chest tube Severe protein calorie  malnutrition.   Plan -increase TF to goal, wean TPN -Management per CT surgery  Medication induced hypotension Septic shock; polymicrobial, Pseudomonas, E. coli, strep angiosis Increasing levophed requirement  Plan: -Hopeful to be able to wean levophed as precedex is weaned -start midodrine per tube -remove right IJ line  Bradycardia. Likely medication induced. Normal PR interval on 8/8 EKG. -working on titrating down precedex  Post operative anemia, Chronic anemia Stable. -Continue to monitor. Transfuse for hemoglobin <7  Hyperbilirubinemia. Bili up 1>1.8>2.9. slight bump in AST/ALT, AP. Suspect correlation with TPN. -hopeful to be able to turn off TPN tomorrow. Will continue to monitor  Hyperglycemia. CBGs at goal.  -SSI  Best Practice (right click and "Reselect all SmartList Selections" daily)   Diet/type: TF/TPN DVT prophylaxis: LMWH GI prophylaxis: PPI Lines: NG, J-tube, PICC Code Status:  full code Last date of multidisciplinary goals of care discussion [per primary]  [name], MD Internal Medicine Resident PGY-3 [facility] Pager: [phone] 09/14/2020 8:52 AM     09/14/2020   I have seen and evaluated the patient for postop respiratory failure.   S:  Seen in f/u for respiratory failure. Has some nonspecific pain. Remains on vent.   O: Blood pressure (!) 83/56, pulse (!) 53, temperature (!) 97.5 F (36.4 C), temperature source Axillary, resp. rate 16, height 5\' 8"  (1.727 m), weight 55.6 kg, SpO2 95 %.  Ill appearing cachetic man on vent Lungs with severe rhonci worse on L Copious ongoing secretions Ext with muscle wasting Remains on some pressors   Abundant pseudomonas still in tracheal aspirate Mg/K low LFTs up slightly Cr creeping up   A:  Persistent respiratory failure due to muscular deconditioning and severe HCAP Esophagectomy for esophageal stricture, path benign Severe protein calorie malnutrition  POA Ongoing HCAP with heavy secretion burden   P:  - Add inhaled tobramycin x 14 days - Zosyn to fortaz x 7 days - Add CPT, hypertonic negs - Will advance TF to see how J tube leak is; need to work on sedation wean and PS trials - Remove R IJ line - Wean off TPN if we can - He will likely need LTACH - Lasix to PO - Try to get off precedex and see how pressor requirements go - Postop drain management per primary   Patient critically ill due to respiratory failure Interventions to address this today vent titration Risk of deterioration without these interventions is high   I personally spent 33 minutes providing critical care not including any separately billable procedures   MD [facility] Prefer epic messenger for cross cover needs If after hours, please call E-link

## 2020-09-14 NOTE — Progress Notes (Signed)
      [address]       [name] [address]             [phone]      ON vent via trach  BP (!) 141/73   Pulse (!) 128   Temp 100 F (37.8 C) (Oral)   Resp (!) 34   Ht 5\' 8"  (1.727 m)   Wt 55.6 kg   SpO2 92%   BMI 18.64 kg/m  Norepi @10  PRVC 15/40%/ 5 CBG OK  TF held for leaking around J tube, on C. , MD [facility] [phone]

## 2020-09-15 DIAGNOSIS — J9601 Acute respiratory failure with hypoxia: Secondary | ICD-10-CM | POA: Diagnosis not present

## 2020-09-15 LAB — COMPREHENSIVE METABOLIC PANEL
ALT: 56 U/L — ABNORMAL HIGH (ref 0–44)
AST: 71 U/L — ABNORMAL HIGH (ref 15–41)
Albumin: 1.8 g/dL — ABNORMAL LOW (ref 3.5–5.0)
Alkaline Phosphatase: 86 U/L (ref 38–126)
Anion gap: 4 — ABNORMAL LOW (ref 5–15)
BUN: 16 mg/dL (ref 6–20)
CO2: 33 mmol/L — ABNORMAL HIGH (ref 22–32)
Calcium: 8.3 mg/dL — ABNORMAL LOW (ref 8.9–10.3)
Chloride: 97 mmol/L — ABNORMAL LOW (ref 98–111)
Creatinine, Ser: 0.57 mg/dL — ABNORMAL LOW (ref 0.61–1.24)
GFR, Estimated: >60 mL/min (ref >60)
Glucose, Bld: 225 mg/dL — ABNORMAL HIGH (ref 70–99)
Potassium: 4.8 mmol/L (ref 3.5–5.1)
Sodium: 134 mmol/L — ABNORMAL LOW (ref 135–145)
Total Bilirubin: 2.6 mg/dL — ABNORMAL HIGH (ref 0.3–1.2)
Total Protein: 4.7 g/dL — ABNORMAL LOW (ref 6.5–8.1)

## 2020-09-15 LAB — MAGNESIUM: Magnesium: 2.1 mg/dL (ref 1.7–2.4)

## 2020-09-15 LAB — CBC
HCT: 24.6 % — ABNORMAL LOW (ref 39.0–52.0)
Hemoglobin: 8 g/dL — ABNORMAL LOW (ref 13.0–17.0)
MCH: 30.9 pg (ref 26.0–34.0)
MCHC: 32.5 g/dL (ref 30.0–36.0)
MCV: 95 fL (ref 80.0–100.0)
Platelets: 496 10*3/uL — ABNORMAL HIGH (ref 150–400)
RBC: 2.59 MIL/uL — ABNORMAL LOW (ref 4.22–5.81)
RDW: 15.9 % — ABNORMAL HIGH (ref 11.5–15.5)
WBC: 13.6 10*3/uL — ABNORMAL HIGH (ref 4.0–10.5)
nRBC: 0 % (ref 0.0–0.2)

## 2020-09-15 LAB — GLUCOSE, CAPILLARY
Glucose-Capillary: 127 mg/dL — ABNORMAL HIGH (ref 70–99)
Glucose-Capillary: 129 mg/dL — ABNORMAL HIGH (ref 70–99)
Glucose-Capillary: 127 mg/dL — ABNORMAL HIGH (ref 70–99)
Glucose-Capillary: 112 mg/dL — ABNORMAL HIGH (ref 70–99)
Glucose-Capillary: 100 mg/dL — ABNORMAL HIGH (ref 70–99)
Glucose-Capillary: 137 mg/dL — ABNORMAL HIGH (ref 70–99)

## 2020-09-15 LAB — PHOSPHORUS: Phosphorus: 3.7 mg/dL (ref 2.5–4.6)

## 2020-09-15 MED ORDER — SENNOSIDES 8.8 MG/5ML PO SYRP
10.0000 mL | ORAL_SOLUTION | Freq: Two times a day (BID) | ORAL | Status: DC
  Administered 2020-09-15 – 2020-09-26 (×16): 10 mL
  Filled 2020-09-15 (×18): qty 10

## 2020-09-15 MED ORDER — QUETIAPINE FUMARATE 50 MG PO TABS
50.0000 mg | ORAL_TABLET | Freq: Two times a day (BID) | ORAL | Status: DC
  Administered 2020-09-15 – 2020-09-27 (×25): 50 mg
  Filled 2020-09-15 (×25): qty 1

## 2020-09-15 MED ORDER — POLYETHYLENE GLYCOL 3350 17 G PO PACK
17.0000 g | PACK | Freq: Two times a day (BID) | ORAL | Status: DC
  Administered 2020-09-15 – 2020-09-26 (×10): 17 g
  Filled 2020-09-15 (×15): qty 1

## 2020-09-15 MED ORDER — TRACE MINERALS CU-MN-SE-ZN 300-55-60-3000 MCG/ML IV SOLN
INTRAVENOUS | Status: AC
  Filled 2020-09-15: qty 742.4

## 2020-09-15 MED ORDER — SORBITOL 70 % SOLN
960.0000 mL | TOPICAL_OIL | Freq: Two times a day (BID) | ORAL | Status: DC
  Administered 2020-09-15 (×2): 960 mL via RECTAL
  Filled 2020-09-15 (×5): qty 473

## 2020-09-15 NOTE — Progress Notes (Signed)
NAME:  Ralph [name], MRN:  [id number], DOB:  10-07-1965, LOS: 14 ADMISSION DATE:  09/01/2020, CONSULTATION DATE:  8/3 REFERRING MD:  Dr. Cliffton Asters, CHIEF COMPLAINT:  esophageal rupture esophageal stricture   History of Present Illness:  Patient is a 55 yo M w/ PMH anxiety, CAD, MI, stroke presents to [facility] on 7/28 for esophagectomy w/ Dr. Cliffton Asters for esophageal stricture.  On 7/28, Dr. Cliffton Asters performed the esophagectomy without complication.  8/3 patient taken back to OR for esophageal rupture s/p esophagectomy by Dr. Cliffton Asters. Unable to extubate post procedure. Patient remains intubated on mechanical ventilation. ABG with respiratory acidosis.   PCCM consulted for vent management. He remains critically ill  Pertinent  Medical History   Past Medical History:  Diagnosis Date   Anxiety    Coronary artery disease    Myocardial infarction [facility])    Stroke (HCC)    Significant Hospital Events: Including procedures, antibiotic start and stop dates in addition to other pertinent events   7/28 admitted for planned esophagectomy. Chest tube was placed on right chest. J tube in place. 2 JP drains in place. Patient was extubated post procedure. Patient developed subcutaneous emphysema. Unable to use J tube due to leakage around site. 8/3: PCCM consulted for vent management after pt brought back to OR for esophageal rupture/ leak. Vanc/zosyn/fluconazole started. Hypotension noted post-op. Felt 2/2 Sepsis; treated w/ volume and pressors.  8/4: Still on norepinephrine.  Central venous pressure suggesting he is still hypovolemic so receiving fluid.  Spontaneous breathing trial initiated but not ready for extubation due to work of breathing.  culture sent from chest tube.  J-tube replaced in interventional radiology due to leaking.  Tube feeds resumed in the afternoon 8/5: J-tube site again leaking, tube feeds placed on hold around midnight.Still pressor dependent . Passed SBT. WOB improved.  Failed  extubation and was reintubated 8/8: J-tube upsized 8/9: Tracheostomy 8/10 J-tube continues to leak. TF stopped. RT IJ CVC removed.  Antibiotics:  Cefoxitin 7/28-7/29 Cefazolin 7/29 Fluconazole 8/3>> Gentamicin 8/3 Zosyn 8/3>>8/9 Vancomycin 8/3-8/7 Ceftazidime 8/10>> Tobramycin nebs 8/10>>  Interim History / Subjective:  No acute events overnight. Off precedex. Weaning hydromorphone drip.  On 9 mcg levo and 1.5 mg/hr dilaudid  Trach on vent  Unable to obtain subjective evaluation due to patient status  Objective   Blood pressure 102/69, pulse 90, temperature 98.4 F (36.9 C), temperature source Oral, resp. rate 16, height 5\' 8"  (1.727 m), weight 55.8 kg, SpO2 95 %. CVP:  [9 mmHg] 9 mmHg  Vent Mode: PRVC FiO2 (%):  [40 %] 40 % Set Rate:  [15 bmp] 15 bmp Vt Set:  [540 mL] 540 mL PEEP:  [5 cmH20] 5 cmH20 Plateau Pressure:  [18 cmH20-33 cmH20] 18 cmH20   Intake/Output Summary (Last 24 hours) at 09/15/2020 0734 Last data filed at 09/15/2020 0600 Gross per 24 hour  Intake 3537.6 ml  Output 3000 ml  Net 537.6 ml    Filed Weights   09/13/20 0500 09/14/20 0500 09/15/20 0355  Weight: 57.1 kg 55.6 kg 55.8 kg    Examination:  General: in bed, NAD, appears comfortable  HEENT: MM pink/moist, anicteric, atraumatic, 8.0 shiley trach c/d/o Neuro: GCS 11t, RASS 0, PERRL 61mm CV: S1S2, ST, no m/r/g appreciated PULM:  air movement appreciated in all lobes, Trachea midline, chest expansion symmetric, thick tan secretions GI: soft, bsx4 hypoactive, j tube in place   Extremities: warm/dry, no pretibial edema, capillary less than 3 seconds  Skin: DTI on sacrum with foam  dressing, no other no rashes or lesions   Assessment & Plan:   Acute respiratory failure w/ hypoxia and hypercarbia requiring mechanical ventilation.  S/p tracheostomy (8/9) Pan-sensitive pseudomonas pneumonia Plan: -LTVV strategy with tidal volumes of 4-8 cc/kg ideal body weight -Goal plateau pressures of less  than 30 and driving pressures of 15 -Wean PEEP/FiO2 for SpO2 92-98% -Follow intermittent CXR and ABG PRN -VAP bundle -Daily SAT and SBT. Continue trach collar trials -Mobilize with nursing -Wean hydromorphone gtt. Goal CPOT 0-2.  -Continue per tube hydromorphone 4mg  q4h, klonopin 1mg  bid, and increase seroquel to 50mg  bid. -Continue inhaled tobramycin BID and saline nebulizer -continue ceftazidime for 7 days total  Constipation Last documented stool on 8/2 -continue relistor and reglan -BID enemas -wean hydromorphone drip today -AM KUB -Hope that increased stool output will improve leakage from j tube.  Esophageal stricture s/p esophagectomy complicated by post operative esophageal rupture and J-tube leakage J-tube upsized 8/8. Tube feeding stopped again on 8/11 due to leak around J tube Severe protein calorie malnutrition.   Plan -continue holding TF at this time. Plan to restart feeding today (8/11) if patient has BM -Management per CT surgery -Continue TPN. Appreciate pharmacy assistance  Medication induced hypotension Septic shock; polymicrobial, Pseudomonas, E. coli, strep angiosis Overnight levophed 8->12 -> 9. Suspect medication related component.  Plan: -Continue midodrine 10mg  TID -Wean hydromorphone gtt today -Goal MAP 65 or greater. Continue levophed. Titrate to goal. -Continue Ceftazidime IV, tobramycin nebs, and fluconazole IV   Bradycardia-resolved Now in NSR/ low ST. Suspect secondary to precedex -monitor  Post operative anemia, Chronic anemia HGB 8.2>8.6>8. No signs of bleeding -Monitor, follow up H/H -transfuse for Hgb <7%, active bleeding  Hyperbilirubinemia. Bili 1>1.8>2.9>2.6. AST downtrending, ALT 51>56. Suspect secondary TPN -Monitor -Continue to wean TPN as able  Hyperglycemia BG at goal.  -Blood Glucose goal 140-180. -SSI   Best Practice (right click and "Reselect all SmartList Selections" daily)   Diet/type: TPN/TF DVT prophylaxis:  LMWH GI prophylaxis: PPI Lines: NG, J-tube, PICC Code Status:  full code Last date of multidisciplinary goals of care discussion [per primary]   Critical care time: 2 Minutes  10/11., MSN, APRN, AGACNP-BC [place] [facility]  09/15/2020 , 7:40 AM  Please see [email or web address] for pager details  If no response, please call [phone] After hours, please call Elink at [phone]

## 2020-09-15 NOTE — Progress Notes (Signed)
PHARMACY - TOTAL PARENTERAL NUTRITION CONSULT NOTE   Indication:  intolerance to enteral feeding  Patient Measurements: Height: 5\' 8"  (172.7 cm) Weight: 55.8 kg (123 lb 0.3 oz) IBW/kg (Calculated) : 68.4 TPN AdjBW (KG): 61 Body mass index is 18.7 kg/m. Usual Weight: 52 kg  Assessment:  54 yom with severe malnutrition and hx benign esophageal stricture with J-tube who is s/p planned robotic Ivor Lewis esophagectomy on 7/28. TF were held due to drainage around J-tube on 7/30, resumed at lower rate on 7/31. TF held again on 8/2 for continued drainage around J-tube. He was taken back to the OR on 8/3 for revision of anastomotic leak. J-tube replaced in IR on 8/4 but continues to have drainage. Significant refeeding risk noted. Pharmacy consulted for TPN.   Glucose / Insulin: no hx DM, CBGs controlled on SSI, 10 units used in last 24h (did get a few hours of TFs at 30, now off) Electrolytes: Na 136, Cl low 93, CO2 high 36, K 3.7 (Lasix given), Mg 1.8, Phos 3.8 on 8/8, CoCa 9.9 Renal: SCr <1 Hepatic: LFT trending down from yesterday after change to cyclic, Tbili down to 2.6- no jaundice observed, albumin up 2.1, prealbumin <5 Intake / Output; MIVF: UOP 6.2 L out on lasix 40 mg IV x 3 doses, D5LR at 45 ml/hr, NGT O/P 200, drain O/P 70 ml, chest tube O/P 10 ml, net +6L, LBM 8/2 -s/p Relgan 10 q6h + Relistor x1  GI Imaging: 8/3 CT abd/pelvis: small gas-filled collection on the left side of the mediastinum probably represents a small pocket at the esophageal-gastric anastomosis, large amount of pneumoperitoneum GI Surgeries / Procedures:  8/4 J-tube replaced in IR  Central access: double lumen CVC 09/01/20, triple lumen PICC 8/5 TPN start date: 09/09/20  Nutritional Goals (per RD recommendation on 8/5): kCal: 2100-2300, Protein: 100-120 g, Fluid: >= 2 L Goal TPN rate is 95 mL/hr (provides 112 g of protein and 2216 kcals per day)  Current Nutrition:  TPN and Tube feeding - Tube feeding held due  to leaking around J-tube  Plan:  -Continue cyclic TPN at 1800 PM to help reduce liver stimulation and concentrate to help reduce volume.  Infuse 1920 mL over 18 hrs : 56 mL/hr x 1 hr, then 113 mL/hr x 16 hrs, then 56 mL/hr x 1 hr. -This TPN provides 112 g of protein, 324 g of dextrose, and 68.4 g of lipids for a total of 2230 kcals meeting 100% of patient needs - Will monitor how CBGs do today during off period of TPN - if tolerates, will attempt to cycle further Friday -Electrolytes in TPN: adjusted for new volume - Na  80 mEq/L, reduce to K 65 mEq/L, Ca 6 mEq/L, Mg 7 mEq/L, and Phos 18 mmol/L. Maximizing Cl in TPN.  Add standard MVI and trace elements to TPN Continue TCTS SSI q4h SSI and adjust as needed  Monitor TPN labs on Mon/Thurs Recheck BMP and Mag in AM   Thank you for involving pharmacy in this patient's care.  Wednesday, PharmD, BCPS, BCCCP Clinical Pharmacist Please refer to [facility] for [facility] Pharmacy numbers 09/15/2020, 7:20 AM

## 2020-09-15 NOTE — Progress Notes (Signed)
[address]       [place] [address]             [phone]                 8 Days Post-Op Procedure(s) (LRB): XI ROBOTIC ASSISTED THORASCOPY-DECORTICATION & REVISION OF ESOPHAGECTOMY (Right) ESOPHAGOGASTRODUODENOSCOPY (EGD) (N/A) INTERCOSTAL NERVE BLOCK (Right)   Events: No events.  _______________________________________________________________ Vitals: BP 112/76 (BP Location: Left Arm)   Pulse (!) 109   Temp 98.8 F (37.1 C) (Oral)   Resp (!) 29   Ht 5\' 8"  (1.727 m)   Wt 55.8 kg   SpO2 93%   BMI 18.70 kg/m   - Neuro: awake  - Cardiovascular: Sinus  Drips: Levophed at 7.      - Pulm:  Vent Mode: PSV;CPAP FiO2 (%):  [40 %] 40 % Set Rate:  [15 bmp] 15 bmp Vt Set:  [540 mL] 540 mL PEEP:  [5 cmH20] 5 cmH20 Pressure Support:  [20 cmH20] 20 cmH20 Plateau Pressure:  [18 cmH20-24 cmH20] 18 cmH20  ABG    Component Value Date/Time   PHART 7.414 09/08/2020 0444   PCO2ART 32.8 09/08/2020 0444   PO2ART 98 09/08/2020 0444   HCO3 20.7 09/08/2020 0444   TCO2 22 09/08/2020 0444   ACIDBASEDEF 3.0 (H) 09/08/2020 0444   O2SAT 97.0 09/08/2020 0444    - Abd: Soft - Extremity: Cool  .Intake/Output      08/10 0701 08/11 0700 08/11 0701 08/12 0700   I.V. (mL/kg) 2527.7 (45.3)    Other 230    NG/GT 479.8    IV Piggyback 300.1    Total Intake(mL/kg) 3537.6 (63.4)    Urine (mL/kg/hr) 2810 (2.1)    Emesis/NG output 50    Drains 30    Chest Tube 110    Total Output 3000    Net +537.6         Urine Occurrence 1 x       _______________________________________________________________ Labs: CBC Latest Ref Rng & Units 09/15/2020 09/14/2020 09/13/2020  WBC 4.0 - 10.5 K/uL 13.6(H) 15.0(H) 14.2(H)  Hemoglobin 13.0 - 17.0 g/dL 8.0(L) 8.6(L) 8.2(L)  Hematocrit 39.0 - 52.0 % 24.6(L) 26.5(L) 25.4(L)  Platelets 150 - 400 K/uL 496(H) 380 265   CMP Latest Ref Rng & Units 09/15/2020 09/14/2020 09/13/2020  Glucose 70 - 99 mg/dL 11/13/2020) 751(W) 258(N)  BUN 6  - 20 mg/dL 16 16 12   Creatinine 0.61 - 1.24 mg/dL 277(Ralph) 2.42(P)  Sodium 135 - 145 mmol/L 134(L) 136 134(L)  Potassium 3.5 - 5.1 mmol/L 4.8 3.7 3.8  Chloride 98 - 111 mmol/L 97(L) 93(L) 93(L)  CO2 22 - 32 mmol/L 33(H) 36(H) 32  Calcium 8.9 - 10.3 mg/dL 8.3(L) 8.4(L) 8.5(L)  Total Protein 6.5 - 8.1 g/dL 4.7(L) 5.0(L) -  Total Bilirubin 0.3 - 1.2 mg/dL 2.6(H) 2.9(H) -  Alkaline Phos 38 - 126 U/L 86 94 -  AST 15 - 41 U/L 71(H) 77(H) -  ALT 0 - 44 U/L 56(H) 51(H) -    CXR: -  _______________________________________________________________  Assessment and Plan: POD 13 s/p robotic Ivor Lewis esophagectomy, postop day 7 status post reexploration with reinforcement of anastomosis.  Neuro: Wean sedation as tolerated CV: Wean Levophed as tolerated Pulm: Continue trach care. Renal: Creatinine stable. GI:  On TPN.  J-tube is functioning.  On trickle feeds and slowly ramping up.  Bowel regimen.  Will remove peritoneal drain today Heme: Stable ID: Antibiotics changed and  given cultures we will treat with Diflucan for total of 10 days. Endo: Sliding scale insulin Dispo: Continue ICU care.   Ralph [name] Ralph [name] 09/15/2020 9:00 AM

## 2020-09-16 ENCOUNTER — Inpatient Hospital Stay (HOSPITAL_COMMUNITY): Payer: Medicaid Other

## 2020-09-16 ENCOUNTER — Telehealth: Payer: Self-pay | Admitting: Thoracic Surgery (Cardiothoracic Vascular Surgery)

## 2020-09-16 DIAGNOSIS — J9601 Acute respiratory failure with hypoxia: Secondary | ICD-10-CM | POA: Diagnosis not present

## 2020-09-16 DIAGNOSIS — Z9911 Dependence on respirator [ventilator] status: Secondary | ICD-10-CM | POA: Diagnosis not present

## 2020-09-16 DIAGNOSIS — K9189 Other postprocedural complications and disorders of digestive system: Secondary | ICD-10-CM | POA: Diagnosis not present

## 2020-09-16 DIAGNOSIS — Z930 Tracheostomy status: Secondary | ICD-10-CM

## 2020-09-16 LAB — COMPREHENSIVE METABOLIC PANEL
ALT: 56 U/L — ABNORMAL HIGH (ref 0–44)
AST: 57 U/L — ABNORMAL HIGH (ref 15–41)
Albumin: 1.9 g/dL — ABNORMAL LOW (ref 3.5–5.0)
Alkaline Phosphatase: 95 U/L (ref 38–126)
Anion gap: 8 (ref 5–15)
BUN: 18 mg/dL (ref 6–20)
CO2: 33 mmol/L — ABNORMAL HIGH (ref 22–32)
Calcium: 8.9 mg/dL (ref 8.9–10.3)
Chloride: 95 mmol/L — ABNORMAL LOW (ref 98–111)
Creatinine, Ser: 0.58 mg/dL — ABNORMAL LOW (ref 0.61–1.24)
GFR, Estimated: >60 mL/min (ref >60)
Glucose, Bld: 143 mg/dL — ABNORMAL HIGH (ref 70–99)
Potassium: 4.2 mmol/L (ref 3.5–5.1)
Sodium: 136 mmol/L (ref 135–145)
Total Bilirubin: 2 mg/dL — ABNORMAL HIGH (ref 0.3–1.2)
Total Protein: 5.3 g/dL — ABNORMAL LOW (ref 6.5–8.1)

## 2020-09-16 LAB — CBC
HCT: 25 % — ABNORMAL LOW (ref 39.0–52.0)
Hemoglobin: 8 g/dL — ABNORMAL LOW (ref 13.0–17.0)
MCH: 29.9 pg (ref 26.0–34.0)
MCHC: 32 g/dL (ref 30.0–36.0)
MCV: 93.3 fL (ref 80.0–100.0)
Platelets: 666 10*3/uL — ABNORMAL HIGH (ref 150–400)
RBC: 2.68 MIL/uL — ABNORMAL LOW (ref 4.22–5.81)
RDW: 15.9 % — ABNORMAL HIGH (ref 11.5–15.5)
WBC: 12.4 10*3/uL — ABNORMAL HIGH (ref 4.0–10.5)
nRBC: 0 % (ref 0.0–0.2)

## 2020-09-16 LAB — BASIC METABOLIC PANEL
Anion gap: 8 (ref 5–15)
BUN: 18 mg/dL (ref 6–20)
CO2: 32 mmol/L (ref 22–32)
Calcium: 8.8 mg/dL — ABNORMAL LOW (ref 8.9–10.3)
Chloride: 96 mmol/L — ABNORMAL LOW (ref 98–111)
Creatinine, Ser: 0.64 mg/dL (ref 0.61–1.24)
GFR, Estimated: >60 mL/min (ref >60)
Glucose, Bld: 102 mg/dL — ABNORMAL HIGH (ref 70–99)
Potassium: 3.7 mmol/L (ref 3.5–5.1)
Sodium: 136 mmol/L (ref 135–145)

## 2020-09-16 LAB — GLUCOSE, CAPILLARY
Glucose-Capillary: 125 mg/dL — ABNORMAL HIGH (ref 70–99)
Glucose-Capillary: 125 mg/dL — ABNORMAL HIGH (ref 70–99)
Glucose-Capillary: 127 mg/dL — ABNORMAL HIGH (ref 70–99)
Glucose-Capillary: 129 mg/dL — ABNORMAL HIGH (ref 70–99)
Glucose-Capillary: 129 mg/dL — ABNORMAL HIGH (ref 70–99)
Glucose-Capillary: 139 mg/dL — ABNORMAL HIGH (ref 70–99)
Glucose-Capillary: 92 mg/dL (ref 70–99)

## 2020-09-16 MED ORDER — ZINC OXIDE 40 % EX OINT
TOPICAL_OINTMENT | Freq: Two times a day (BID) | CUTANEOUS | Status: DC
  Administered 2020-09-17 – 2020-09-25 (×4): 1 via TOPICAL
  Filled 2020-09-16 (×2): qty 57

## 2020-09-16 MED ORDER — FUROSEMIDE 10 MG/ML IJ SOLN
40.0000 mg | Freq: Three times a day (TID) | INTRAMUSCULAR | Status: AC
  Administered 2020-09-16 (×2): 40 mg via INTRAVENOUS
  Filled 2020-09-16 (×2): qty 4

## 2020-09-16 MED ORDER — TRACE MINERALS CU-MN-SE-ZN 300-55-60-3000 MCG/ML IV SOLN
INTRAVENOUS | Status: AC
  Filled 2020-09-16: qty 742.4

## 2020-09-16 NOTE — Progress Notes (Signed)
Updated Sherral Hammers Tenpenny's significant other, Garner Gavel, over the phone on [place]hy [place]. Vivian's condition.   Gershon Mussel., MSN, APRN, AGACNP-BC  Pulmonary & Critical Care  09/16/2020 , 3:26 PM  Please see [email or web address] for pager details  If no response, please call [phone] After hours, please call Elink at [phone]

## 2020-09-16 NOTE — Progress Notes (Signed)
Patient pulled out NG tube, notified attending MD. Minimal drainage from NG tube to leave it off for now as per Dr. Cliffton Asters

## 2020-09-16 NOTE — Consult Note (Signed)
WOC Nurse Consult Note: Patient receiving care in [facility] 2H06 Transferred from 2C05 after taken back to the OR on 09/07/20.  Reason for Consult: Sacral DTI Wound type: DTPI on the coccyx Pressure Injury POA: No Measurement: 3 cm x 2 cm kissing lesions with open areas on the left and right that is pink Periwound: intact Dressing procedure/placement/frequency: Clean the sacral area with no rinse cleanser, pat dry and apply a layer of Desitin directly on the open wound then replace the sacral foam. Apply twice daily or PRN soiling.  Place foam dressings on bony prominences for prophylaxis.  Pressure Injury Prevention Bundle Support surfaces (air mattress) chair cushion Hart [place] # [phone]) Heel offloading boots [place] # [phone]) Turning and Positioning  Measures to reduce shear (draw sheet, knees up) Skin protection Products (Foam dressing) Moisture management products (Critic-Aid Barrier Cream (Purple top) Nutrition Management Protection for Medical Devices Routine Skin Assessment   Monitor the wound area(s) for worsening of condition such as: Signs/symptoms of infection, increase in size, development of or worsening of odor, development of pain, or increased pain at the affected locations.   Notify the medical team if any of these develop.  Thank you for the consult. WOC nurse will not follow at this time.   Please re-consult the WOC team if needed.  Renaldo Reel Katrinka Blazing, MSN, RN, CMSRN, AGCNS, [facility] Wound Treatment Associate Pager [phone]  c

## 2020-09-16 NOTE — Progress Notes (Signed)
Physical Therapy Treatment Patient Details Name: Ralph [name] MRN: [id number] DOB: 09/24/1965 Today's Date: 09/16/2020    History of Present Illness 55 y/o male admitted 7/28 for an Ivor-Lewis esophagectomy due to benign lower esophageal stricture and protein malnutrition.   8/3 pt returned to OR for esophageal rupture with exploration and repair with Jtube placement.  8/9 Tracheostomy.  PMHx: anxiety, CAD, MI, Stroke, multiple balloon dilations to esophageal stricture.    PT Comments    Pt awake and stating willingness to mobilize. Pt happy to have restraints temporarily undone to scratch face. Pt sat EOB 3 min unsupported prior to fatigue from back pain then return to supported sitting prior to standing trial. Pt educated for HEP, transfers and progression and will require extensive therapy to regain strength and function for D/C. Pt with pillow under right hip end of session.   Vent FiO2 40% PRVC, RR 22-35 with SpO2 98-100% HR 52-65   Follow Up Recommendations  LTACH;Supervision/Assistance - 24 hour     Equipment Recommendations  Rolling walker with 5" wheels;Hospital bed    Recommendations for Other Services       Precautions / Restrictions Precautions Precautions: Fall;Other (comment) Precaution Comments: J tube, NG tube, chest tube, trach, 2 jp drains Restrictions Weight Bearing Restrictions: No    Mobility  Bed Mobility Overal bed mobility: Needs Assistance Bed Mobility: Supine to Sit;Sit to Supine;Rolling Rolling: Mod assist         General bed mobility comments: utilized foot egress to transition to sitting and supine with total assist. Total +2 to slide toward HOB. Mod assist to bend knee and roll with cues bil for pericare    Transfers Overall transfer level: Needs assistance   Transfers: Sit to/from Stand Sit to Stand: Max assist;+2 safety/equipment         General transfer comment: physical assist with bil knee block and use of pad to stand with pt  maintaining hip flexion and left knee flexion with multimodal cues for upright posture grossly 20 sec. Pt denied repeated attempts  Ambulation/Gait             General Gait Details: unable   Stairs             Wheelchair Mobility    Modified Rankin (Stroke Patients Only)       Balance Overall balance assessment: Needs assistance Sitting-balance support: Feet supported;Single extremity supported;Bilateral upper extremity supported Sitting balance-Leahy Scale: Fair Sitting balance - Comments: pt with tripod posture in sitting with minguard for safety 3 min at EOB   Standing balance support: Bilateral upper extremity supported Standing balance-Leahy Scale: Zero Standing balance comment: bil UE and knee block with assist                            Cognition Arousal/Alertness: Awake/alert Behavior During Therapy: Flat affect;Anxious Overall Cognitive Status: Difficult to assess                                 General Comments: pt asking for cell phone and able to mouth back pain with sitting. Slightly anxious with mobility and lines needing reinforcement to be aware of lines and not pull      Exercises General Exercises - Lower Extremity Long Arc Quad: AROM;Both;Seated;10 reps Heel Slides: AROM;Both;Supine;10 reps    General Comments        Pertinent Vitals/Pain Pain Score:  5  Pain Location: back pain with sitting Pain Descriptors / Indicators: Grimacing;Guarding Pain Intervention(s): Limited activity within patient's tolerance;Monitored during session;Repositioned    Home Living                      Prior Function            PT Goals (current goals can now be found in the care plan section) Acute Rehab PT Goals PT Goal Formulation: Patient unable to participate in goal setting Time For Goal Achievement: 09/30/20 Potential to Achieve Goals: Fair Progress towards PT goals: Progressing toward goals     Frequency    Min 3X/week      PT Plan Discharge plan needs to be updated    Co-evaluation              AM-PAC PT "6 Clicks" Mobility   Outcome Measure  Help needed turning from your back to your side while in a flat bed without using bedrails?: A Lot Help needed moving from lying on your back to sitting on the side of a flat bed without using bedrails?: Total Help needed moving to and from a bed to a chair (including a wheelchair)?: Total Help needed standing up from a chair using your arms (e.g., wheelchair or bedside chair)?: Total Help needed to walk in hospital room?: Total Help needed climbing 3-5 steps with a railing? : Total 6 Click Score: 7    End of Session Equipment Utilized During Treatment: Other (comment) (vent) Activity Tolerance: Patient limited by fatigue Patient left: in bed;with call bell/phone within reach;with bed alarm set Nurse Communication: Mobility status;Need for lift equipment PT Visit Diagnosis: Other abnormalities of gait and mobility (R26.89);Muscle weakness (generalized) (M62.81);Difficulty in walking, not elsewhere classified (R26.2)     Time: 0814-4818 PT Time Calculation (min) (ACUTE ONLY): 33 min  Charges:  $Therapeutic Activity: 23-37 mins                     Pascale Maves P, PT Acute Rehabilitation Services Pager: [phone] Office: [phone]    Nickolus Wadding B Malkia Nippert 09/16/2020, 10:59 AM

## 2020-09-16 NOTE — Progress Notes (Signed)
RT note-Attempted to wean again this afternoon, not tolerated, increased RR 40's. Remains on full support.

## 2020-09-16 NOTE — Progress Notes (Signed)
PHARMACY - TOTAL PARENTERAL NUTRITION CONSULT NOTE   Indication:  intolerance to enteral feeding  Patient Measurements: Height: 5\' 8"  (172.7 cm) Weight: 54.2 kg (119 lb 7.8 oz) IBW/kg (Calculated) : 68.4 TPN AdjBW (KG): 61 Body mass index is 18.17 kg/m. Usual Weight: 52 kg  Assessment:  54 yom with severe malnutrition and hx benign esophageal stricture with J-tube who is s/p planned robotic Ivor Lewis esophagectomy on 7/28. TF were held due to drainage around J-tube on 7/30, resumed at lower rate on 7/31. TF held again on 8/2 for continued drainage around J-tube. He was taken back to the OR on 8/3 for revision of anastomotic leak. J-tube replaced in IR on 8/4 but continues to have drainage. Significant refeeding risk noted. Pharmacy consulted for TPN.   Glucose / Insulin: no hx DM, CBGs controlled on SSI, 8 units used in last 24h (TFs now off) Electrolytes: Na 136, Cl low 95, CO2 high 33, K 4.2, Mg 2/1 on 8/11, Phos 3.7 on 8/11, CoCa 9.9 Renal: SCr <1 Hepatic: LFT trending down from yesterday after change to cyclic, Tbili down to 2 - no jaundice observed, albumin 1.9, prealbumin <5 Intake / Output; MIVF: UOP 3.1 L , NGT O/P 0, drain O/P 10 ml, chest tube O/P 40 ml, net +6L, LBM 8/11  GI Imaging: 8/3 CT abd/pelvis: small gas-filled collection on the left side of the mediastinum probably represents a small pocket at the esophageal-gastric anastomosis, large amount of pneumoperitoneum 8/10 Jtube positioning ok GI Surgeries / Procedures:  8/4 J-tube replaced in IR  Central access: double lumen CVC 09/01/20, triple lumen PICC 8/5 TPN start date: 09/09/20  Nutritional Goals (per RD recommendation on 8/5): kCal: 2100-2300, Protein: 100-120 g, Fluid: >= 2 L Goal TPN rate is 95 mL/hr (provides 112 g of protein and 2216 kcals per day)  Current Nutrition:  TPN and Tube feeding - Tube feeding held due to leaking around J-tube  Plan:  -Continue cyclic TPN at 1800 PM to help reduce liver  stimulation and concentrate to help reduce volume.  Infuse 1920 mL over 18 hrs : 56 mL/hr x 1 hr, then 113 mL/hr x 16 hrs, then 56 mL/hr x 1 hr. -This TPN provides 112 g of protein, 324 g of dextrose, and 68.4 g of lipids for a total of 2230 kcals meeting 100% of patient needs - Monitor CBGs during off period of TPN - if tolerates, can attempt to shorten cycle further  -Electrolytes in TPN: adjusted for new volume - Na  80 mEq/L, K 65 mEq/L, Ca 6 mEq/L, Mg 7 mEq/L, and Phos 18 mmol/L. Maximizing Cl in TPN.  Add standard MVI and trace elements to TPN Continue TCTS SSI q4h SSI and adjust as needed  Monitor TPN labs on Mon/Thurs Recheck BMP, Phos and Mag in AM  Thank you for involving pharmacy in this patient's care.  10/5, PharmD, [facility] Clinical Pharmacist Please see AMION for all Pharmacists' Contact Phone Numbers 09/16/2020, 7:45 AM

## 2020-09-16 NOTE — Progress Notes (Addendum)
NAME:  Ralph [name], MRN:  [id number], DOB:  [birth year], LOS: 38 ADMISSION DATE:  09/01/2020, CONSULTATION DATE:  8/3 REFERRING MD:  Dr. Kipp Brood, CHIEF COMPLAINT:  esophageal rupture esophageal stricture   History of Present Illness:  Patient is a 54 yo M w/ PMH anxiety, CAD, MI, stroke presents to [facility] on 7/28 for esophagectomy w/ Dr. Kipp Brood for esophageal stricture.  On 7/28, Dr. Kipp Brood performed the esophagectomy without complication.  8/3 patient taken back to OR for esophageal rupture s/p esophagectomy by Dr. Kipp Brood. Unable to extubate post procedure. Patient remains intubated on mechanical ventilation. ABG with respiratory acidosis.   PCCM consulted for vent management. He remains critically ill  Pertinent  Medical History   Past Medical History:  Diagnosis Date   Anxiety    Coronary artery disease    Myocardial infarction [facility])    Stroke ([place])    Significant Hospital Events: Including procedures, antibiotic start and stop dates in addition to other pertinent events   7/28 admitted for planned esophagectomy. Chest tube was placed on right chest. J tube in place. 2 JP drains in place. Patient was extubated post procedure. Patient developed subcutaneous emphysema. Unable to use J tube due to leakage around site. 8/3: PCCM consulted for vent management after pt brought back to OR for esophageal rupture/ leak. Vanc/zosyn/fluconazole started. Hypotension noted post-op. Felt 2/2 Sepsis; treated w/ volume and pressors.  8/4: Still on norepinephrine.  Central venous pressure suggesting he is still hypovolemic so receiving fluid.  Spontaneous breathing trial initiated but not ready for extubation due to work of breathing.  culture sent from chest tube.  J-tube replaced in interventional radiology due to leaking.  Tube feeds resumed in the afternoon 8/5: J-tube site again leaking, tube feeds placed on hold around midnight.Still pressor dependent . Passed SBT. WOB improved.  Failed  extubation and was reintubated 8/8: J-tube upsized 8/9: Tracheostomy 8/10 J-tube continues to leak. TF stopped. RT IJ CVC removed.  Antibiotics:  Cefoxitin 7/28-7/29 Cefazolin 7/29 Fluconazole 8/3>> Gentamicin 8/3 Zosyn 8/3>>8/10 Vancomycin 8/3-8/7 Ceftazidime 8/10>> Tobramycin nebs 8/10>>  Interim History / Subjective:  Tmax 100.1  J tube leaking overnight per nursing  X3 BM in past 24 hours  Off levophed overnight, off hydromorphone gtt  Trach on vent  Unable to obtain subjective evaluation due to patient status  Objective   Blood pressure (!) 96/58, pulse 80, temperature 100.1 F (37.8 C), temperature source Oral, resp. rate 19, height 5' 8"  (1.727 m), weight 54.2 kg, SpO2 95 %.    Vent Mode: PRVC FiO2 (%):  [40 %] 40 % Set Rate:  [15 bmp] 15 bmp Vt Set:  [540 mL] 540 mL PEEP:  [5 cmH20] 5 cmH20 Pressure Support:  [20 cmH20] 20 cmH20 Plateau Pressure:  [20 cmH20-28 cmH20] 20 cmH20   Intake/Output Summary (Last 24 hours) at 09/16/2020 0718 Last data filed at 09/16/2020 0600 Gross per 24 hour  Intake 3560.88 ml  Output 3150 ml  Net 410.88 ml   Filed Weights   09/14/20 0500 09/15/20 0355 09/16/20 0339  Weight: 55.6 kg 55.8 kg 54.2 kg    Examination:  General:  in bed, NAD, appears comfortable HEENT: MM pink/moist, anicteric, atraumatic Neuro: GCS 11t, RASS 0, PERRL 47m CV: S1S2, NSR, no m/r/g appreciated PULM:  some rhonci in the upper lobe and the lower lobes, 8.0 shiley xlt C/D/I, chest expansion symmetric GI: soft, bsx4 hypoactive, non distended, j tube with dried yellow/green drainage.   Extremities: warm/dry, no pretibial edema, capillary  refill less than 3 seconds  Skin: DTI on sacrum with foam dressing, other no rashes or lesions  Resolved Problems Bradycardia  Assessment & Plan:   Acute respiratory failure w/ hypoxia and hypercarbia requiring mechanical ventilation.  S/p tracheostomy (8/9) HCAP- Pan-sensitive pseudomonas  pneumonia Plan: -LTVV strategy with tidal volumes of 4-8 cc/kg ideal body weight -Goal plateau pressures less than 30 and driving pressures less than 15 -Wean PEEP/FiO2 for SpO2 92-98% -VAP bundle -Daily SAT and SBT -Follow intermittent CXR and ABG PRN -continue prn xopinex nebs -Off hydromorphone gtt. Continue per tube hydromorphone 67m q4h, klonopin 122mbid, and seroquel 50 BID. -Was on Zosyn for 7 days. Continue ceftazidime for at least 7 days total (Last dose 8/17) and Tobramycin nebs for 14 days (last dose 8/27). May need prolonged treatment for psuedomonas depending on clinical course. -Continue mobilization with PT  Bilateral pleural effusion CXR shows bilateral layering pleural effusions left greater than right -Will evaluate at bedside with [place]. -Continue 4045maily lasix, will consider additional diuresis based on [place] -Evaluated L effusion with [place] enough fluid present for drainage. Plan to give 45m71mrosemide TID on 8/12  Constipation- Improving X3 bm overnight. KUB with no abnormal gas pattern. -Continue reglan -Stop BID enemas -Continue miralax, sennosides, and docusate   Esophageal stricture s/p esophagectomy complicated by post operative esophageal rupture and J-tube leakage J-tube upsized 8/8. Tube feeding stopped again on 8/11 due to leak around J tube, yellow/green drainage overnight per nursing. Severe protein calorie malnutrition.   Plan -Management per CT surgery -Continue holding TF at this time.  -Continue cyclic TPN. Appreciate pharmacy assistance.  Medication induced hypotension-improving Septic shock; polymicrobial, Pseudomonas, E. coli, strep angiosis-improving Secondary to above. Off hydromorphone gtt. Off levophed overnight. Was on zosyn for 7 days.  Plan: -Continue midodrine 10mg20m. -Goal MAP 60-65. Monitor off levophed. Resume levophed if MAP goals not met. -Continue Ceftazidime IV and tobramycin nebs. Fluconazole IV stop  today.  Thrombocytosis PLT [id number] stress response to sepsis. -Monitor on AM CBC  Post operative anemia, Chronic anemia HGB 8.2>8.6>8>8. No signs of active bleedign -follow up H/H -transfuse for Hgb <7%, active bleeding  Hyperbilirubinemia. Transaminitis  Bili 1>1.8>2.9>2.6>2.0. AST 71>57, ALT 51>56>51. Suspect secondary to TPN -Monitor -Continue to wean TPN as able  Hyperglycemia Bg 100-143. On TPN -Blood Glucose goal 140-180. -SSI -Continue monitoring  DTI-Sacrum -Continue q2h turn -Continue foam dressing -Wound care consult placed. Therapy per wound care orders.  Best Practice (right click and "Reselect all SmartList Selections" daily)   Diet/type: TPN DVT prophylaxis: LMWH GI prophylaxis: PPI Lines: NG, J-tube, PICC Code Status:  full code Last date of multidisciplinary goals of care discussion [per primary]   Critical care time: 31 Mi52tes  TimotRedmond SchoolN, APRN, AGACNP-BC [facility]  09/16/2020 , 7:18 AM  Please see [email or web address] for pager details  If no response, please call [phone] After hours, please call Elink at [phone]

## 2020-09-16 NOTE — Plan of Care (Signed)
  Problem: Respiratory: Goal: Ability to maintain a clear airway will improve Outcome: Progressing   Problem: Clinical Measurements: Goal: Ability to maintain clinical measurements within normal limits will improve Outcome: Progressing Goal: Will remain free from infection Outcome: Progressing Goal: Diagnostic test results will improve Outcome: Progressing Goal: Respiratory complications will improve Outcome: Progressing Goal: Cardiovascular complication will be avoided Outcome: Progressing

## 2020-09-16 NOTE — Progress Notes (Signed)
[address]       [place] [address]             [phone]                 9 Days Post-Op Procedure(s) (LRB): XI ROBOTIC ASSISTED THORASCOPY-DECORTICATION & REVISION OF ESOPHAGECTOMY (Right) ESOPHAGOGASTRODUODENOSCOPY (EGD) (N/A) INTERCOSTAL NERVE BLOCK (Right)   Events: No events. J tube leaking  _______________________________________________________________ Vitals: BP 111/71   Pulse (!) 103   Temp 99.2 F (37.3 C) (Oral)   Resp (!) 22   Ht 5\' 8"  (1.727 m)   Wt 54.2 kg   SpO2 94%   BMI 18.17 kg/m   - Neuro: awake  - Cardiovascular: Sinus  Drips:       - Pulm:  Vent Mode: PRVC FiO2 (%):  [40 %] 40 % Set Rate:  [15 bmp] 15 bmp Vt Set:  [540 mL] 540 mL PEEP:  [5 cmH20] 5 cmH20 Pressure Support:  [20 cmH20] 20 cmH20 Plateau Pressure:  [20 cmH20-28 cmH20] 23 cmH20  ABG    Component Value Date/Time   PHART 7.414 09/08/2020 0444   PCO2ART 32.8 09/08/2020 0444   PO2ART 98 09/08/2020 0444   HCO3 20.7 09/08/2020 0444   TCO2 22 09/08/2020 0444   ACIDBASEDEF 3.0 (H) 09/08/2020 0444   O2SAT 97.0 09/08/2020 0444    - Abd: Soft - Extremity: Cool  .Intake/Output      08/11 0701 08/12 0700 08/12 0701 08/13 0700   I.V. (mL/kg) 3261 (60.2) 730.1 (13.5)   Other     NG/GT     IV Piggyback 299.9 100   Total Intake(mL/kg) 3560.9 (65.7) 830.1 (15.3)   Urine (mL/kg/hr) 3100 (2.4) 1800 (2.8)   Emesis/NG output     Drains 10    Stool 0 0   Chest Tube 40    Total Output 3150 1800   Net +410.9 -969.9        Urine Occurrence 4 x 1 x   Stool Occurrence 2 x 3 x      _______________________________________________________________ Labs: CBC Latest Ref Rng & Units 09/16/2020 09/15/2020 09/14/2020  WBC 4.0 - 10.5 K/uL 12.4(H) 13.6(H) 15.0(H)  Hemoglobin 13.0 - 17.0 g/dL 8.0(L) 8.0(L) 8.6(L)  Hematocrit 39.0 - 52.0 % 25.0(L) 24.6(L) 26.5(L)  Platelets 150 - 400 K/uL 666(H) 496(H) 380   CMP Latest Ref Rng & Units 09/16/2020 09/16/2020  09/15/2020  Glucose 70 - 99 mg/dL 11/15/2020) 109(N) 235(T)  BUN 6 - 20 mg/dL 18 18 16   Creatinine 0.61 - 1.24 mg/dL 732(K ) 0.25)  Sodium 135 - 145 mmol/L 136 136 134(L)  Potassium 3.5 - 5.1 mmol/L 3.7 4.2 4.8  Chloride 98 - 111 mmol/L 96(L) 95(L) 97(L)  CO2 22 - 32 mmol/L 32 33(H) 33(H)  Calcium 8.9 - 10.3 mg/dL 4.27(C) 8.9 6.23(J)  Total Protein 6.5 - 8.1 g/dL - 5.3(L) 4.7(L)  Total Bilirubin 0.3 - 1.2 mg/dL - 2.0(H) 2.6(H)  Alkaline Phos 38 - 126 U/L - 95 86  AST 15 - 41 U/L - 57(H) 71(H)  ALT 0 - 44 U/L - 56(H) 56(H)    CXR: -  _______________________________________________________________  Assessment and Plan: POD 14 s/p robotic Ivor Lewis esophagectomy, postop day 8 status post reexploration with reinforcement of anastomosis.  Neuro: Wean sedation as tolerated CV: Wean Levophed as tolerated Pulm: Continue trach care. Renal: Creatinine stable. GI:  On TPN.  J-tube is functioning.  On trickle feeds and slowly ramping up.  Bowel regimen.  Will remove peritoneal drain today Heme: Stable ID: Antibiotics changed and given cultures we will treat with Diflucan for total of 10 days. Endo: Sliding scale insulin Dispo: Continue ICU care.   Corliss Skains 09/16/2020 6:57 PM

## 2020-09-16 NOTE — Progress Notes (Signed)
Nutrition Follow-up  DOCUMENTATION CODES:   Underweight, Severe malnutrition in context of chronic illness  INTERVENTION:   Continue cyclic TPN to meet 100% estimated calorie needs  Recommend continuing to hold TF, hopefully this will allow area around J-tube to heal.  Once ready to resume TF via J-tube, Recommend: Vital 1.5 at 20 with goal rate of 60 ml/hr Pro-Source TF 45 mL BID Provides 119 g of protein and 2240 kcals   NUTRITION DIAGNOSIS:   Severe Malnutrition related to chronic illness (recurrent esophageal stricture s/p J-tube) as evidenced by severe muscle depletion, severe fat depletion.  Being addressed via TF   GOAL:   Patient will meet greater than or equal to 90% of their needs  Progressing  MONITOR:   Vent status, Labs, Weight trends, Skin, I & O's, Other (Comment) (TPN)  REASON FOR ASSESSMENT:   Consult Enteral/tube feeding initiation and management  ASSESSMENT:   55 year old male who presented on 7/28 for esophagogastroduodenoscopy and robotic assisted esophagectomy. PMH of esophageal stricture s/p multiple dilations and J-tube placement in April 2022, stroke, CAD, MI, anxiety.  Pt remains on vent support via trach Off levophed  Tolerating cyclic TPN. Current TPN provides 112 g of protein and 2230 kcals  TF on hold, J-tube continues to leak bilious colored fluid. Per RN, leakage was significant   +multiple BMs post SMOG enema  Current wt 54 kg  WOC RN evaluating DTI sacrum (3 x 2 cm kissing lesions with open areas on left and right)  Labs: reviewed Meds: reviewed   Diet Order:   Diet Order             Diet NPO time specified  Diet effective midnight                   EDUCATION NEEDS:   No education needs have been identified at this time  Skin:  Skin Assessment: Skin Integrity Issues: Skin Integrity Issues:: DTI, Incisions DTI: sacrum Incisions: abdoment, R chest x 2  Last BM:  8/12  Height:   Ht Readings from Last 1  Encounters:  09/07/20 5\' 8"  (1.727 m)    Weight:   Wt Readings from Last 1 Encounters:  09/16/20 54.2 kg     BMI:  Body mass index is 18.17 kg/m.  Estimated Nutritional Needs:   Kcal:  1950-2250 kcals  Protein:  110-125 g  Fluid:  >/= 2.0 L   11/16/20 MS, RDN, LDN, CNSC Registered Dietitian III Clinical Nutrition RD Pager and On-Call Pager Number Located in [place]

## 2020-09-17 DIAGNOSIS — R5381 Other malaise: Secondary | ICD-10-CM | POA: Diagnosis not present

## 2020-09-17 DIAGNOSIS — Z9911 Dependence on respirator [ventilator] status: Secondary | ICD-10-CM | POA: Diagnosis not present

## 2020-09-17 DIAGNOSIS — J151 Pneumonia due to Pseudomonas: Secondary | ICD-10-CM

## 2020-09-17 DIAGNOSIS — Z930 Tracheostomy status: Secondary | ICD-10-CM | POA: Diagnosis not present

## 2020-09-17 LAB — COMPREHENSIVE METABOLIC PANEL
ALT: 47 U/L — ABNORMAL HIGH (ref 0–44)
AST: 37 U/L (ref 15–41)
Albumin: 1.9 g/dL — ABNORMAL LOW (ref 3.5–5.0)
Alkaline Phosphatase: 86 U/L (ref 38–126)
Anion gap: 8 (ref 5–15)
BUN: 21 mg/dL — ABNORMAL HIGH (ref 6–20)
CO2: 31 mmol/L (ref 22–32)
Calcium: 8.7 mg/dL — ABNORMAL LOW (ref 8.9–10.3)
Chloride: 98 mmol/L (ref 98–111)
Creatinine, Ser: 0.48 mg/dL — ABNORMAL LOW (ref 0.61–1.24)
GFR, Estimated: >60 mL/min (ref >60)
Glucose, Bld: 109 mg/dL — ABNORMAL HIGH (ref 70–99)
Potassium: 3.8 mmol/L (ref 3.5–5.1)
Sodium: 137 mmol/L (ref 135–145)
Total Bilirubin: 1.4 mg/dL — ABNORMAL HIGH (ref 0.3–1.2)
Total Protein: 5.4 g/dL — ABNORMAL LOW (ref 6.5–8.1)

## 2020-09-17 LAB — CBC
HCT: 23.7 % — ABNORMAL LOW (ref 39.0–52.0)
Hemoglobin: 7.9 g/dL — ABNORMAL LOW (ref 13.0–17.0)
MCH: 30.7 pg (ref 26.0–34.0)
MCHC: 33.3 g/dL (ref 30.0–36.0)
MCV: 92.2 fL (ref 80.0–100.0)
Platelets: 751 10*3/uL — ABNORMAL HIGH (ref 150–400)
RBC: 2.57 MIL/uL — ABNORMAL LOW (ref 4.22–5.81)
RDW: 15.8 % — ABNORMAL HIGH (ref 11.5–15.5)
WBC: 11 10*3/uL — ABNORMAL HIGH (ref 4.0–10.5)
nRBC: 0 % (ref 0.0–0.2)

## 2020-09-17 LAB — GLUCOSE, CAPILLARY
Glucose-Capillary: 105 mg/dL — ABNORMAL HIGH (ref 70–99)
Glucose-Capillary: 127 mg/dL — ABNORMAL HIGH (ref 70–99)
Glucose-Capillary: 121 mg/dL — ABNORMAL HIGH (ref 70–99)
Glucose-Capillary: 90 mg/dL (ref 70–99)
Glucose-Capillary: 113 mg/dL — ABNORMAL HIGH (ref 70–99)

## 2020-09-17 LAB — PHOSPHORUS: Phosphorus: 3.9 mg/dL (ref 2.5–4.6)

## 2020-09-17 LAB — MAGNESIUM: Magnesium: 2.1 mg/dL (ref 1.7–2.4)

## 2020-09-17 MED ORDER — VITAL HIGH PROTEIN PO LIQD
1000.0000 mL | ORAL | Status: DC
  Administered 2020-09-17 – 2020-09-19 (×2): 1000 mL

## 2020-09-17 MED ORDER — FUROSEMIDE 10 MG/ML IJ SOLN
40.0000 mg | Freq: Three times a day (TID) | INTRAMUSCULAR | Status: AC
  Administered 2020-09-17 – 2020-09-18 (×3): 40 mg via INTRAVENOUS
  Filled 2020-09-17 (×3): qty 4

## 2020-09-17 MED ORDER — TRACE MINERALS CU-MN-SE-ZN 300-55-60-3000 MCG/ML IV SOLN
INTRAVENOUS | Status: AC
  Filled 2020-09-17: qty 832

## 2020-09-17 MED ORDER — SODIUM CHLORIDE 0.9 % IV SOLN
INTRAVENOUS | Status: DC | PRN
  Administered 2020-09-17 – 2020-09-19 (×2): 500 mL via INTRAVENOUS
  Administered 2020-09-21: 250 mL via INTRAVENOUS
  Administered 2020-09-21: 500 mL via INTRAVENOUS
  Administered 2020-09-27: 250 mL via INTRAVENOUS
  Administered 2020-10-01: 500 mL via INTRAVENOUS

## 2020-09-17 MED ORDER — STERILE WATER FOR INJECTION IJ SOLN
INTRAMUSCULAR | Status: AC
  Filled 2020-09-17: qty 10

## 2020-09-17 MED ORDER — FENTANYL CITRATE (PF) 100 MCG/2ML IJ SOLN
25.0000 ug | INTRAMUSCULAR | Status: DC | PRN
  Administered 2020-09-17 – 2020-09-22 (×27): 25 ug via INTRAVENOUS
  Filled 2020-09-17 (×27): qty 2

## 2020-09-17 NOTE — Progress Notes (Signed)
[address]       [place] [address]             [phone]                 10 Days Post-Op Procedure(s) (LRB): XI ROBOTIC ASSISTED THORASCOPY-DECORTICATION & REVISION OF ESOPHAGECTOMY (Right) ESOPHAGOGASTRODUODENOSCOPY (EGD) (N/A) INTERCOSTAL NERVE BLOCK (Right)   Events: No events. J tube leaking continues Off tube feeds On TPN  _______________________________________________________________ Vitals: BP 108/67   Pulse 86   Temp 99 F (37.2 C) (Oral)   Resp 20   Ht 5\' 8"  (1.727 m)   Wt 51.5 kg   SpO2 94%   BMI 17.26 kg/m   - Neuro: awake  - Cardiovascular: Sinus  Drips:       - Pulm:  Vent Mode: PRVC FiO2 (%):  [40 %] 40 % Set Rate:  [15 bmp] 15 bmp Vt Set:  [540 mL] 540 mL PEEP:  [5 cmH20] 5 cmH20 Plateau Pressure:  [19 cmH20-24 cmH20] 22 cmH20  ABG    Component Value Date/Time   PHART 7.414 09/08/2020 0444   PCO2ART 32.8 09/08/2020 0444   PO2ART 98 09/08/2020 0444   HCO3 20.7 09/08/2020 0444   TCO2 22 09/08/2020 0444   ACIDBASEDEF 3.0 (H) 09/08/2020 0444   O2SAT 97.0 09/08/2020 0444    - Abd: Soft - Extremity: Cool  .Intake/Output      08/12 0701 08/13 0700 08/13 0701 08/14 0700   I.V. (mL/kg) 2150.9 (41.8) 113 (2.2)   Other  60   IV Piggyback 299.9    Total Intake(mL/kg) 2450.8 (47.6) 173 (3.4)   Urine (mL/kg/hr) 3100 (2.5) 130 (1)   Drains 20 10   Stool 1    Chest Tube 10 10   Total Output 3131 150   Net -680.2 +23        Urine Occurrence 2 x    Stool Occurrence 5 x       _______________________________________________________________ Labs: CBC Latest Ref Rng & Units 09/17/2020 09/16/2020 09/15/2020  WBC 4.0 - 10.5 K/uL 11.0(H) 12.4(H) 13.6(H)  Hemoglobin 13.0 - 17.0 g/dL 7.9(L) 8.0(L) 8.0(L)  Hematocrit 39.0 - 52.0 % 23.7(L) 25.0(L) 24.6(L)  Platelets 150 - 400 K/uL 751(H) 666(H) 496(H)   CMP Latest Ref Rng & Units 09/17/2020 09/16/2020 09/16/2020  Glucose 70 - 99 mg/dL 11/16/2020) 563(O) 756(E)  BUN 6 - 20  mg/dL 332(R) 18 18  Creatinine 0.61 - 1.24 mg/dL 51(O) 8.41(Y 6.06)  Sodium 135 - 145 mmol/L 137 136 136  Potassium 3.5 - 5.1 mmol/L 3.8 3.7 4.2  Chloride 98 - 111 mmol/L 98 96(L) 95(L)  CO2 22 - 32 mmol/L 31 32 33(H)  Calcium 8.9 - 10.3 mg/dL 3.01(S) 0.1(U) 8.9  Total Protein 6.5 - 8.1 g/dL 9.3(A) - 5.3(L)  Total Bilirubin 0.3 - 1.2 mg/dL 3.5(T) - 2.0(H)  Alkaline Phos 38 - 126 U/L 86 - 95  AST 15 - 41 U/L 37 - 57(H)  ALT 0 - 44 U/L 47(H) - 56(H)    CXR: -  _______________________________________________________________  Assessment and Plan: POD 15 s/p robotic Ivor Lewis esophagectomy, postop day 9 status post reexploration with reinforcement of anastomosis.  Neuro: Wean sedation as tolerated CV: off levo Pulm: Continue trach care. Renal: Creatinine stable. GI:  On TPN.  J-tube feeds held  On bowel regimen.  Will remove peritoneal drain today Heme: Stable ID: Antibiotics changed and given cultures we will treat with Diflucan for total of 10  days. Endo: Sliding scale insulin Dispo: Continue ICU care.   Corliss Skains 09/17/2020 9:29 AM

## 2020-09-17 NOTE — Progress Notes (Signed)
NAME:  AMIRR ACHORD, MRN:  [id number], DOB:  1965-05-15, LOS: 16 ADMISSION DATE:  09/01/2020, CONSULTATION DATE:  8/3 REFERRING MD:  Dr. Cliffton Asters, CHIEF COMPLAINT:  esophageal rupture esophageal stricture   History of Present Illness:  Patient is a 55 yo M w/ PMH anxiety, CAD, MI, stroke presents to [facility] on 7/28 for esophagectomy w/ Dr. Cliffton Asters for esophageal stricture.  On 7/28, Dr. Cliffton Asters performed the esophagectomy without complication.  8/3 patient taken back to OR for esophageal rupture s/p esophagectomy by Dr. Cliffton Asters. Unable to extubate post procedure. Patient remains intubated on mechanical ventilation. ABG with respiratory acidosis.   PCCM consulted for vent management. He remains critically ill  Pertinent  Medical History   Past Medical History:  Diagnosis Date   Anxiety    Coronary artery disease    Myocardial infarction [facility])    Stroke (HCC)    Significant Hospital Events: Including procedures, antibiotic start and stop dates in addition to other pertinent events   7/28 admitted for planned esophagectomy. Chest tube was placed on right chest. J tube in place. 2 JP drains in place. Patient was extubated post procedure. Patient developed subcutaneous emphysema. Unable to use J tube due to leakage around site. 8/3: PCCM consulted for vent management after pt brought back to OR for esophageal rupture/ leak. Vanc/zosyn/fluconazole started. Hypotension noted post-op. Felt 2/2 Sepsis; treated w/ volume and pressors.  8/4: Still on norepinephrine.  Central venous pressure suggesting he is still hypovolemic so receiving fluid.  Spontaneous breathing trial initiated but not ready for extubation due to work of breathing.  culture sent from chest tube.  J-tube replaced in interventional radiology due to leaking.  Tube feeds resumed in the afternoon 8/5: J-tube site again leaking, tube feeds placed on hold around midnight.Still pressor dependent . Passed SBT. WOB improved.  Failed  extubation and was reintubated 8/8: J-tube upsized 8/9: Tracheostomy 8/10 J-tube continues to leak. TF stopped. RT IJ CVC removed. 8/13 starting trickle feeds  Antibiotics:  Cefoxitin 7/28-7/29 Cefazolin 7/29 Fluconazole 8/3>> Gentamicin 8/3 Zosyn 8/3>>8/10 Vancomycin 8/3-8/7 Ceftazidime 8/10>> Tobramycin nebs 8/10>>  Interim History / Subjective:  J tube still leaking, was somewhat of an issue prior to his surgery. He has more anxiety today but otherwise denies complaints.  Tmax 99.6.  Objective   Blood pressure 101/60, pulse 76, temperature 99.4 F (37.4 C), temperature source Oral, resp. rate 14, height 5\' 8"  (1.727 m), weight 51.5 kg, SpO2 98 %.    Vent Mode: CPAP;PSV FiO2 (%):  [40 %] 40 % Set Rate:  [15 bmp] 15 bmp Vt Set:  [540 mL] 540 mL PEEP:  [5 cmH20] 5 cmH20 Pressure Support:  [24 cmH20-26 cmH20] 24 cmH20 Plateau Pressure:  [19 cmH20-24 cmH20] 22 cmH20   Intake/Output Summary (Last 24 hours) at 09/17/2020 1146 Last data filed at 09/17/2020 0800 Gross per 24 hour  Intake 2623.8 ml  Output 3281 ml  Net -657.2 ml    Filed Weights   09/15/20 0355 09/16/20 0339 09/17/20 0239  Weight: 55.8 kg 54.2 kg 51.5 kg    Examination:  General:  chronically ill appearing man lying in bed in NAD, watching TV HEENT: [place]/AT, eyes anicteric Neuro: awake, alert, moving all extremities but globally weak CV: S1S2, RRR PULM:  Rhonchi cleared with suctioning, thin and clear fluid from ETT. Periodic tachypneic phases where he spontaneously slows down on his own.  GI: Soft, NT. Minimal bilious drainage around J-tube. Extremities: no peripheral edema, no cyanosis Skin: warm, dry, pallor  Resolved issues:  Bradycardia  Assessment & Plan:   Acute respiratory failure w/ hypoxia and hypercarbia requiring mechanical ventilation.  S/p tracheostomy (8/9) HAP- Pan-sensitive Pseudomonas pneumonia Plan: -Con't LTVV, 4-8cc/kg IBW with goal Pplat<30 and DP<15. -working on vent  weaning, starting with PS 26 today; will wean down. -routine trach care -VAP prevention protocol -PRN xopenex Con't weaning enteral sedation- seroquel, dilaudid, clonazepam -Complete 14 days of antibiotics for pseudomonas. Completed 7 days of Zosyn. Continue ceftazidime for at least 7 days total and Tobramycin nebs for 14 days (last dose 8/27). Needs  follow up cultures- planning to treat to cure given first episode of Pseudomonas. -Continue mobilization with PT  Bilateral pleural effusions; left too small to intervene on -lasix TID again today  Constipation- resolved X3 bm overnight. KUB with no abnormal gas pattern. -Continue reglan -Continue miralax, sennosides, and docusate   Esophageal stricture s/p esophagectomy complicated by post operative esophageal rupture and J-tube leakage J-tube upsized 8/8. Tube feeding stopped again on 8/11 due to leak around J tube, yellow/green drainage overnight per nursing. Severe protein calorie malnutrition, predates surgery   -Appreciate TCTS management; planning for drain removal today. -Re-initiate trickle tube feeds. Con't TPN until able to meet needs enterally.   Medication induced hypotension- stable Septic shock; polymicrobial, Pseudomonas, E. coli, strep angiosis-resolved Plan: -Continue midodrine 10mg  TID. -Continue Ceftazidime IV and tobramycin nebs.  Thrombocytosis, reactive PLT . Suspect stress response to sepsis. -Monitor on AM CBC  Post operative anemia, Chronic anemia HGB 8.2>8.6>8>8. No signs of active bleedign -follow up H/H -transfuse for Hgb <7%, active bleeding  Hyperbilirubinemia, improving Transaminase elevation- potentially congestive hepatopathy -con't to monitor -diuresis  Hyperglycemia, controlled with minimal insulin requirements -goal BG 140-180 -SSI  DTI-Sacrum -Continue q2h turn -Continue foam dressing -Wound care consult placed. Therapy per wound care orders.  Best Practice (right click  and "Reselect all SmartList Selections" daily)   Diet/type: TPN, restarting trickle TF DVT prophylaxis: LMWH GI prophylaxis: PPI Lines: NG, J-tube, PICC Code Status:  full code Last date of multidisciplinary goals of care discussion [per primary]  This patient is critically ill with multiple organ system failure which requires frequent high complexity decision making, assessment, support, evaluation, and titration of therapies. This was completed through the application of advanced monitoring technologies and extensive interpretation of multiple databases. During this encounter critical care time was devoted to patient care services described in this note for 41 minutes.   [id number], DO 09/17/20 12:02 PM [facility]

## 2020-09-17 NOTE — Progress Notes (Signed)
PHARMACY - TOTAL PARENTERAL NUTRITION CONSULT NOTE   Indication:  intolerance to enteral feeding , leaking J tube, s/p esophagectomy  Patient Measurements: Height: 5\' 8"  (172.7 cm) Weight: 51.5 kg (113 lb 8.6 oz) IBW/kg (Calculated) : 68.4 TPN AdjBW (KG): 61 Body mass index is 17.26 kg/m. Usual Weight: 52 kg  Assessment:  54 yom with severe malnutrition and hx benign esophageal stricture with J-tube who is s/p planned robotic Ivor Lewis esophagectomy on 7/28. TF were held due to drainage around J-tube on 7/30, resumed at lower rate on 7/31. TF held again on 8/2 for continued drainage around J-tube. He was taken back to the OR on 8/3 for revision of anastomotic leak. J-tube replaced in IR on 8/4 but continues to have drainage. Significant refeeding risk noted. Pharmacy consulted for TPN.   Glucose / Insulin: no hx DM, CBGs 105-127 while on TPN, 92 while off TPN yesterday 8/12 (only 100 while off TPN on 8/11) Electrolytes: Lytes WNL this AM. CoCa 10.38 slightly elevated (alb 1.9) Renal: SCr <1 Hepatic: LFT trending down, Tbili down to 1.4 - no jaundice observed, albumin 1.9, prealbumin <5 Intake / Output; MIVF: UOP 3.1 L (2.5), drain O/P 20 ml, chest tube O/P 10 ml, net +3.5 (down), LBM 8/12-multiple GI Meds: Docusate BID, Relgan 10mg  IV q6hr, PPI IV/24h, Senna BID, Miralax BID,   GI Imaging: 8/3 CT abd/pelvis: small gas-filled collection on the left side of the mediastinum probably represents a small pocket at the esophageal-gastric anastomosis, large amount of pneumoperitoneum 8/10 Jtube positioning ok GI Surgeries / Procedures:  8/4 J-tube replaced in IR  Central access: double lumen CVC 09/01/20, triple lumen PICC 8/5 TPN start date: 09/09/20  Nutritional Goals (per RD recommendation on 8/12): kCal: 1950-2250 kcals Protein:  110-125 g, Fluid: >= 2 L Goal TPN rate is 95 mL/hr (provides 112 g of protein and 2216 kcals per day)  Current Nutrition:  TPN and Tube feeding - Tube feeding  held due to leaking around J-tube  Plan:  -Continue cyclic TPN over 18 hrs to help reduce liver stimulation and concentrate to help reduce volume. -This TPN provides 125 g of protein, 268.8 g of dextrose (max GIR 5.12), and 63.4 g of lipids (31%)  for a total of 2047 kcals meeting 100% of patient needs per goals set 8/12. -Electrolytes in TPN: adjusted for new volume - Na  80 mEq/L, K 65 mEq/L, Ca 0 mEq/L (remove for 1 day), Mg 7 mEq/L, and Phos 18 mmol/L. Maximizing Cl in TPN.  Add standard MVI and trace elements to TPN Continue TCTS SSI q4h SSI and adjust as needed  Monitor TPN labs on Mon/Thurs and PRN TF Held, Using tube for meds   Gottlieb Zuercher S. [id number], PharmD, BCPS Clinical Staff Pharmacist [email or web address] 09/17/2020, 8:09 AM

## 2020-09-18 ENCOUNTER — Inpatient Hospital Stay (HOSPITAL_COMMUNITY): Payer: Medicaid Other

## 2020-09-18 DIAGNOSIS — G709 Myoneural disorder, unspecified: Secondary | ICD-10-CM

## 2020-09-18 DIAGNOSIS — J9601 Acute respiratory failure with hypoxia: Secondary | ICD-10-CM | POA: Diagnosis not present

## 2020-09-18 DIAGNOSIS — Z9911 Dependence on respirator [ventilator] status: Secondary | ICD-10-CM | POA: Diagnosis not present

## 2020-09-18 DIAGNOSIS — J99 Respiratory disorders in diseases classified elsewhere: Secondary | ICD-10-CM

## 2020-09-18 DIAGNOSIS — Z930 Tracheostomy status: Secondary | ICD-10-CM | POA: Diagnosis not present

## 2020-09-18 LAB — COMPREHENSIVE METABOLIC PANEL
ALT: 43 U/L (ref 0–44)
AST: 35 U/L (ref 15–41)
Albumin: 2 g/dL — ABNORMAL LOW (ref 3.5–5.0)
Alkaline Phosphatase: 117 U/L (ref 38–126)
Anion gap: 10 (ref 5–15)
BUN: 24 mg/dL — ABNORMAL HIGH (ref 6–20)
CO2: 30 mmol/L (ref 22–32)
Calcium: 8.9 mg/dL (ref 8.9–10.3)
Chloride: 99 mmol/L (ref 98–111)
Creatinine, Ser: 0.57 mg/dL — ABNORMAL LOW (ref 0.61–1.24)
GFR, Estimated: >60 mL/min (ref >60)
Glucose, Bld: 128 mg/dL — ABNORMAL HIGH (ref 70–99)
Potassium: 3.7 mmol/L (ref 3.5–5.1)
Sodium: 139 mmol/L (ref 135–145)
Total Bilirubin: 1.2 mg/dL (ref 0.3–1.2)
Total Protein: 6 g/dL — ABNORMAL LOW (ref 6.5–8.1)

## 2020-09-18 LAB — CBC
HCT: 26 % — ABNORMAL LOW (ref 39.0–52.0)
Hemoglobin: 8.4 g/dL — ABNORMAL LOW (ref 13.0–17.0)
MCH: 30 pg (ref 26.0–34.0)
MCHC: 32.3 g/dL (ref 30.0–36.0)
MCV: 92.9 fL (ref 80.0–100.0)
Platelets: 884 10*3/uL — ABNORMAL HIGH (ref 150–400)
RBC: 2.8 MIL/uL — ABNORMAL LOW (ref 4.22–5.81)
RDW: 15.5 % (ref 11.5–15.5)
WBC: 13.8 10*3/uL — ABNORMAL HIGH (ref 4.0–10.5)
nRBC: 0 % (ref 0.0–0.2)

## 2020-09-18 LAB — GLUCOSE, CAPILLARY
Glucose-Capillary: 109 mg/dL — ABNORMAL HIGH (ref 70–99)
Glucose-Capillary: 117 mg/dL — ABNORMAL HIGH (ref 70–99)
Glucose-Capillary: 117 mg/dL — ABNORMAL HIGH (ref 70–99)
Glucose-Capillary: 118 mg/dL — ABNORMAL HIGH (ref 70–99)
Glucose-Capillary: 122 mg/dL — ABNORMAL HIGH (ref 70–99)
Glucose-Capillary: 76 mg/dL (ref 70–99)
Glucose-Capillary: 90 mg/dL (ref 70–99)

## 2020-09-18 MED ORDER — TRACE MINERALS CU-MN-SE-ZN 300-55-60-3000 MCG/ML IV SOLN
INTRAVENOUS | Status: AC
  Filled 2020-09-18: qty 832

## 2020-09-18 MED ORDER — NITROGLYCERIN 0.4 MG SL SUBL
SUBLINGUAL_TABLET | SUBLINGUAL | Status: AC
  Filled 2020-09-18: qty 1

## 2020-09-18 MED ORDER — FUROSEMIDE 10 MG/ML IJ SOLN
40.0000 mg | Freq: Three times a day (TID) | INTRAMUSCULAR | Status: AC
  Administered 2020-09-18 – 2020-09-19 (×3): 40 mg via INTRAVENOUS
  Filled 2020-09-18 (×3): qty 4

## 2020-09-18 NOTE — Progress Notes (Signed)
Patient ID: Ralph [name], male   DOB: Aug 30, 1965, 55 y.o.   MRN: [id number] TCTS Evening Rounds:  Hemodynamically stable in sinus rhythm  Was off vent for 8 hrs today.  Urine output good.  Chest tube output only 30 cc today.  CXR today continues to show density in left hemithorax that looks like some loculated fluid and consolidation of LLL.

## 2020-09-18 NOTE — Progress Notes (Signed)
eLink Physician-Brief Progress Note Patient Name: KEIRAN GAFFEY DOB: 1965-03-18 MRN: [id number]   Date of Service  09/18/2020  HPI/Events of Note  Notified by nurse earlier in shift about fever and concern about output from drain and appearance of chest tube and drain sites.  eICU Interventions  Blood cultures drawn No change in antimicrobial therapy for now     Intervention Category Intermediate Interventions: Infection - evaluation and management  Henry Russel, P 09/18/2020, 12:50 AM

## 2020-09-18 NOTE — Progress Notes (Signed)
Placed back in full support due to RR 40s

## 2020-09-18 NOTE — Progress Notes (Signed)
PHARMACY - TOTAL PARENTERAL NUTRITION CONSULT NOTE   Indication:  intolerance to enteral feeding , leaking J tube, s/p esophagectomy  Patient Measurements: Height: 5\' 8"  (172.7 cm) Weight: 55 kg (121 lb 4.1 oz) IBW/kg (Calculated) : 68.4 TPN AdjBW (KG): 61 Body mass index is 18.44 kg/m. Usual Weight: 52 kg  Assessment:  54 yom with severe malnutrition and hx benign esophageal stricture with J-tube who is s/p planned robotic Ivor Lewis esophagectomy on 7/28. TF were held due to drainage around J-tube on 7/30, resumed at lower rate on 7/31. TF held again on 8/2 for continued drainage around J-tube. He was taken back to the OR on 8/3 for revision of anastomotic leak. J-tube replaced in IR on 8/4 but continues to have drainage. Significant refeeding risk noted. Pharmacy consulted for TPN.   Glucose / Insulin: no hx DM, CBGs 113-122 while on TPN, 90 while off TPN on 8/13. No insulin in TPN. 6 units SSI Electrolytes: Lytes WNL this AM. CoCa 10.5 slightly elevated  with none in TPN (alb 2) Renal: SCr <1 Hepatic: LFT trending down, Tbili down to 1.2 - no jaundice observed, albumin 2, prealbumin <5 Intake / Output; MIVF: UOP 3 L (2.3), drain O/P 260 ml up, chest tube O/P 110 ml up, net +3 (down), LBM 8/14 - J tube leaking continues noted by MD 8/13--TF tried 8/13 then turned back off. GI Meds: Docusate BID, Relgan 10mg  IV q6hr, PPI IV/24h, Senna BID, Miralax BID,   GI Imaging: 8/3 CT abd/pelvis: small gas-filled collection on the left side of the mediastinum probably represents a small pocket at the esophageal-gastric anastomosis, large amount of pneumoperitoneum 8/10 Jtube positioning ok GI Surgeries / Procedures:  8/4 J-tube replaced in IR  Central access: double lumen CVC 09/01/20, triple lumen PICC 8/5 TPN start date: 09/09/20  Nutritional Goals (per RD recommendation on 8/12): kCal: 1950-2250 kcals Protein:  110-125 g, Fluid: >= 2 L Goal TPN rate is 95 mL/hr (provides 112 g of protein and  2216 kcals per day)  Current Nutrition:  TPN - Tube feeding held due to leaking around J-tube  Plan:  No change to TPN formula on 8/14 -Continue concentrate cyclic TPN over 18 hrs  -This TPN provides 125 g of protein, 268.8 g of dextrose (max GIR 5.12), and 63.4 g of lipids (31%)  for a total of 2047 kcals meeting 100% of patient needs per goals set 8/12. -Electrolytes in TPN: adjusted for new volume - Na  80 mEq/L, K 65 mEq/L, Ca 0 mEq/L, Mg 7 mEq/L, and Phos 18 mmol/L. Maximizing Cl in TPN.  Add standard MVI and trace elements to TPN Continue TCTS SSI q4h SSI and adjust as needed  Monitor TPN labs on Mon/Thurs and PRN TF Held, Using tube for some meds. Will not change other due to tube leaking.   Ralph Aprea S. [id number], PharmD, BCPS Clinical Staff Pharmacist [email or web address] 09/18/2020, 9:36 AM

## 2020-09-18 NOTE — Progress Notes (Addendum)
NAME:  Ralph [name], MRN:  [id number], DOB:  [birth year], LOS: 17 ADMISSION DATE:  09/01/2020, CONSULTATION DATE:  8/3 REFERRING MD:  Dr. Cliffton Asters, CHIEF COMPLAINT:  esophageal rupture esophageal stricture   History of Present Illness:  Patient is a 55 yo M w/ PMH anxiety, CAD, MI, stroke presents to [facility] on 7/28 for esophagectomy w/ Dr. Cliffton Asters for esophageal stricture.  On 7/28, Dr. Cliffton Asters performed the esophagectomy without complication.  8/3 patient taken back to OR for esophageal rupture s/p esophagectomy by Dr. Cliffton Asters. Unable to extubate post procedure. Patient remains intubated on mechanical ventilation. ABG with respiratory acidosis.   PCCM consulted for vent management. He remains critically ill  Pertinent  Medical History   Past Medical History:  Diagnosis Date   Anxiety    Coronary artery disease    Myocardial infarction [facility])    Stroke (HCC)    Significant Hospital Events: Including procedures, antibiotic start and stop dates in addition to other pertinent events   7/28 admitted for planned esophagectomy. Chest tube was placed on right chest. J tube in place. 2 JP drains in place. Patient was extubated post procedure. Patient developed subcutaneous emphysema. Unable to use J tube due to leakage around site. 8/3: PCCM consulted for vent management after pt brought back to OR for esophageal rupture/ leak. Vanc/zosyn/fluconazole started. Hypotension noted post-op. Felt 2/2 Sepsis; treated w/ volume and pressors.  8/4: Still on norepinephrine.  Central venous pressure suggesting he is still hypovolemic so receiving fluid.  Spontaneous breathing trial initiated but not ready for extubation due to work of breathing.  culture sent from chest tube.  J-tube replaced in interventional radiology due to leaking.  Tube feeds resumed in the afternoon 8/5: J-tube site again leaking, tube feeds placed on hold around midnight.Still pressor dependent . Passed SBT. WOB improved.  Failed  extubation and was reintubated 8/8: J-tube upsized 8/9: Tracheostomy 8/10 J-tube continues to leak. TF stopped. RT IJ CVC removed. 8/13 starting trickle feeds 09/18/2020 tube feedings are on hold  Antibiotics:  Cefoxitin 7/28-7/29 Cefazolin 7/29 Fluconazole 8/3>> Gentamicin 8/3 Zosyn 8/3>>8/10 Vancomycin 8/3-8/7 Ceftazidime 8/10>> Tobramycin nebs 8/10>>  Interim History / Subjective:  Spiked a fever 100.4 pancultured  Objective   Blood pressure (!) 148/93, pulse 78, temperature 98.2 F (36.8 C), temperature source Oral, resp. rate 17, height 5\' 8"  (1.727 m), weight 55 kg, SpO2 100 %.    Vent Mode: CPAP;PSV FiO2 (%):  [40 %] 40 % Set Rate:  [15 bmp] 15 bmp Vt Set:  [540 mL] 540 mL PEEP:  [5 cmH20] 5 cmH20 Pressure Support:  [18 cmH20-22 cmH20] 18 cmH20 Plateau Pressure:  [21 cmH20-22 cmH20] 21 cmH20   Intake/Output Summary (Last 24 hours) at 09/18/2020 1154 Last data filed at 09/18/2020 0600 Gross per 24 hour  Intake 2068.37 ml  Output 3225 ml  Net -1156.63 ml   Filed Weights   09/16/20 0339 09/17/20 0239 09/18/20 0500  Weight: 54.2 kg 51.5 kg 55 kg    Examination:  General: Cachectic male in no acute distress HEENT: MM pink/moist tracheostomy is in place Neuro: Follows commands moves all extremities somewhat lethargic CV: Heart sounds are regular at 91 blood pressure 118/81 PULM: Shallow respirations on pressure support of 18 Vent pressure support of 18 FIO2 40 PEEP 5 RATE spontaneous rate of 39 VT continues tidal volumes of 388 400  GI: Faint bowel sounds J-tube is in place with slight green drainage not currently on tube feedings chest tubes with airleak noted GU:  Extremities: warm/dry, 1 edema  Skin: no rashes or lesions right hip with ecchymotic areas.            Resolved issues:  Bradycardia  Assessment & Plan:   Acute respiratory failure w/ hypoxia and hypercarbia requiring mechanical ventilation.  S/p tracheostomy (8/9) HAP-  Pan-sensitive Pseudomonas pneumonia Plan: Wean per protocol currently on 18 to pressure support 5 CPAP with respiratory rate 42 tidal volumes 360 Rest on the vent as needed As needed Xopenex Continue to wean sedation Continue ceftazidime for Lasix 4 days and tobramycin for 13 more days Continue to monitor culture data   Bilateral pleural effusions; left too small to intervene on Continue diuresis as tolerated  Constipation- resolved  KUB with no abnormal gas pattern. Continue bowel regimen  Esophageal stricture s/p esophagectomy complicated by post operative esophageal rupture and J-tube leakage J-tube upsized 8/8.  Currently not on tube feedings TNA is on hold   Severe protein calorie malnutrition, predates surgery   -Appreciate TCTS management; planning for drain removal today. -Re-initiate trickle tube feeds. Con't TPN until able to meet needs enterally.   Medication induced hypotension- stable Septic shock; polymicrobial, Pseudomonas, E. coli, strep angiosis-resolved Plan: Continue midodrine 10 mg 3 times daily Continue ceftazidime and tobramycin nebs Monitor cultures   Thrombocytosis, reactive PLT 380>496>666->884. Suspect stress response to sepsis. Monitor daily  Post operative anemia, Chronic anemia Recent Labs    09/17/20 0406 09/18/20 0318  HGB 7.9* 8.4*    Transfuse per protocol  Hyperbilirubinemia, improving Transaminase elevation- potentially congestive hepatopathy Monitor and diurese  Hyperglycemia, controlled with minimal insulin requirements CBG (last 3)  Recent Labs    09/18/20 0415 09/18/20 0751 09/18/20 1121  GLUCAP 117* 117* 90    09/18/2020 not getting tube feedings or TPN at this time Sliding-scale insulin per protocol   DTI-Sacrum Skin care per protocol Wound care consult has been placed and care further orders   Best Practice (right click and "Reselect all SmartList Selections" daily)   Diet/type: TPN, restarting trickle  TF DVT prophylaxis: LMWH GI prophylaxis: PPI Lines: NG, J-tube, PICC Code Status:  full code Last date of multidisciplinary goals of care discussion [per primary]  APP cct 23 min Brett Canales Kyrie Fludd ACNP Acute Care Nurse Practitioner [facility] Please consult [employer]ion 09/18/2020, 11:54 AM

## 2020-09-18 NOTE — Progress Notes (Signed)
11 Days Post-Op Procedure(s) (LRB): XI ROBOTIC ASSISTED THORASCOPY-DECORTICATION & REVISION OF ESOPHAGECTOMY (Right) ESOPHAGOGASTRODUODENOSCOPY (EGD) (N/A) INTERCOSTAL NERVE BLOCK (Right) Subjective: Trached on vent. Uncomfortable.  Spiked fever to 101.4 at 7 pm. BC drawn. Afebrile since.  Objective: Vital signs in last 24 hours: Temp:  [98 F (36.7 C)-101.4 F (38.6 C)] 98.9 F (37.2 C) (08/14 0754) Pulse Rate:  [65-105] 91 (08/14 0754) Cardiac Rhythm: Normal sinus rhythm (08/14 0500) Resp:  [13-44] 38 (08/14 0754) BP: (85-153)/(55-96) 148/93 (08/14 0600) SpO2:  [86 %-100 %] 95 % (08/14 0754) FiO2 (%):  [40 %] 40 % (08/14 0754) Weight:  [55 kg] 55 kg (08/14 0500)  Hemodynamic parameters for last 24 hours:    Intake/Output from previous day: 08/13 0701 - 08/14 0700 In: 2665 [I.V.:2042.5; NG/GT:22.5; IV Piggyback:300.1] Out: 3375 [Urine:3005; Drains:260; Chest Tube:110] Intake/Output this shift: No intake/output data recorded.  Neurologic: intact Heart: regular rate and rhythm, S1, S2 normal Lungs: diminished breath sounds LLL Right chest tube has purulent-appearing drainage and the drain has greenish drainage. Abdomen: soft, non-tender; bowel sounds normal. Excoriated around the J-tube site with greenish drainage around the tube. Extremities: no edema   Lab Results: Recent Labs    09/17/20 0406 09/18/20 0318  WBC 11.0* 13.8*  HGB 7.9* 8.4*  HCT 23.7* 26.0*  PLT 751* 884*   BMET:  Recent Labs    09/17/20 0406 09/18/20 0318  NA 137 139  K 3.8 3.7  CL 98 99  CO2 31 30  GLUCOSE 109* 128*  BUN 21* 24*  CREATININE 0.48* 0.57*  CALCIUM 8.7* 8.9    PT/INR: No results for input(s): LABPROT, INR in the last 72 hours. ABG    Component Value Date/Time   PHART 7.414 09/08/2020 0444   HCO3 20.7 09/08/2020 0444   TCO2 22 09/08/2020 0444   ACIDBASEDEF 3.0 (H) 09/08/2020 0444   O2SAT 97.0 09/08/2020 0444   CBG (last 3)  Recent Labs    09/18/20 0028  09/18/20 0415 09/18/20 0751  GLUCAP 122* 117* 117*    Assessment/Plan: S/P Procedure(s) (LRB): XI ROBOTIC ASSISTED THORASCOPY-DECORTICATION & REVISION OF ESOPHAGECTOMY (Right) ESOPHAGOGASTRODUODENOSCOPY (EGD) (N/A) INTERCOSTAL NERVE BLOCK (Right)  POD 11  Hemodynamically stable on midodrine 10 tid.  New fever to 101.4 last pm with slight increase in WBC ct 11>13.8. BC drawn. Remains on Fortaz for pleural cultures. Potential sources are lungs, lines, right pleural space infection, J-tube malfunction, moderate left pleural effusion. The murky drainage from the right chest tube could indicate esophageal leak but tubes in good position on last CXR so if present it should be well-drained. Will need to reassess with esophagram at some point. Last study showed no leak although it was limited. He would probably benefit from drainage of the left effusion to rule out infection and will help with vent weaning. Repeat CXR today.  Remains on TNA with tube feeds off due to J-tube leaking.   Vent wean per CCM.   Renal function remains normal.     LOS: 17 days    Alleen Borne 09/18/2020

## 2020-09-18 NOTE — Plan of Care (Signed)
  Problem: Education: Goal: Knowledge of the prescribed therapeutic regimen will improve Outcome: Progressing   Problem: Bowel/Gastric: Goal: Gastrointestinal status for postoperative course will improve Outcome: Progressing   Problem: Nutritional: Goal: Ability to achieve adequate nutritional intake will improve Outcome: Progressing   Problem: Clinical Measurements: Goal: Postoperative complications will be avoided or minimized Outcome: Progressing   Problem: Respiratory: Goal: Ability to maintain a clear airway will improve Outcome: Progressing   Problem: Skin Integrity: Goal: Demonstration of wound healing without infection will improve Outcome: Progressing   Problem: Activity: Goal: Risk for activity intolerance will decrease Outcome: Progressing   Problem: Nutrition: Goal: Adequate nutrition will be maintained Outcome: Progressing   Problem: Coping: Goal: Level of anxiety will decrease Outcome: Progressing

## 2020-09-19 ENCOUNTER — Inpatient Hospital Stay (HOSPITAL_COMMUNITY): Payer: Medicaid Other

## 2020-09-19 DIAGNOSIS — K222 Esophageal obstruction: Secondary | ICD-10-CM | POA: Diagnosis not present

## 2020-09-19 DIAGNOSIS — J9601 Acute respiratory failure with hypoxia: Secondary | ICD-10-CM | POA: Diagnosis not present

## 2020-09-19 LAB — COMPREHENSIVE METABOLIC PANEL
ALT: 50 U/L — ABNORMAL HIGH (ref 0–44)
AST: 47 U/L — ABNORMAL HIGH (ref 15–41)
Albumin: 2 g/dL — ABNORMAL LOW (ref 3.5–5.0)
Alkaline Phosphatase: 131 U/L — ABNORMAL HIGH (ref 38–126)
Anion gap: 7 (ref 5–15)
BUN: 26 mg/dL — ABNORMAL HIGH (ref 6–20)
CO2: 27 mmol/L (ref 22–32)
Calcium: 8.6 mg/dL — ABNORMAL LOW (ref 8.9–10.3)
Chloride: 103 mmol/L (ref 98–111)
Creatinine, Ser: 0.46 mg/dL — ABNORMAL LOW (ref 0.61–1.24)
GFR, Estimated: >60 mL/min (ref >60)
Glucose, Bld: 132 mg/dL — ABNORMAL HIGH (ref 70–99)
Potassium: 4.3 mmol/L (ref 3.5–5.1)
Sodium: 137 mmol/L (ref 135–145)
Total Bilirubin: 1 mg/dL (ref 0.3–1.2)
Total Protein: 5.9 g/dL — ABNORMAL LOW (ref 6.5–8.1)

## 2020-09-19 LAB — CBC WITH DIFFERENTIAL/PLATELET
Abs Immature Granulocytes: 0.19 10*3/uL — ABNORMAL HIGH (ref 0.00–0.07)
Basophils Absolute: 0.2 10*3/uL — ABNORMAL HIGH (ref 0.0–0.1)
Basophils Relative: 1 %
Eosinophils Absolute: 0.5 10*3/uL (ref 0.0–0.5)
Eosinophils Relative: 4 %
HCT: 25.8 % — ABNORMAL LOW (ref 39.0–52.0)
Hemoglobin: 8.2 g/dL — ABNORMAL LOW (ref 13.0–17.0)
Immature Granulocytes: 2 %
Lymphocytes Relative: 10 %
Lymphs Abs: 1.2 10*3/uL (ref 0.7–4.0)
MCH: 29.8 pg (ref 26.0–34.0)
MCHC: 31.8 g/dL (ref 30.0–36.0)
MCV: 93.8 fL (ref 80.0–100.0)
Monocytes Absolute: 1.3 10*3/uL — ABNORMAL HIGH (ref 0.1–1.0)
Monocytes Relative: 10 %
Neutro Abs: 9.2 10*3/uL — ABNORMAL HIGH (ref 1.7–7.7)
Neutrophils Relative %: 73 %
Platelets: 879 10*3/uL — ABNORMAL HIGH (ref 150–400)
RBC: 2.75 MIL/uL — ABNORMAL LOW (ref 4.22–5.81)
RDW: 15.6 % — ABNORMAL HIGH (ref 11.5–15.5)
Smear Review: INCREASED
WBC: 12.4 10*3/uL — ABNORMAL HIGH (ref 4.0–10.5)
nRBC: 0 % (ref 0.0–0.2)

## 2020-09-19 LAB — GLUCOSE, CAPILLARY
Glucose-Capillary: 119 mg/dL — ABNORMAL HIGH (ref 70–99)
Glucose-Capillary: 134 mg/dL — ABNORMAL HIGH (ref 70–99)
Glucose-Capillary: 150 mg/dL — ABNORMAL HIGH (ref 70–99)
Glucose-Capillary: 88 mg/dL (ref 70–99)
Glucose-Capillary: 100 mg/dL — ABNORMAL HIGH (ref 70–99)

## 2020-09-19 LAB — MAGNESIUM: Magnesium: 2.4 mg/dL (ref 1.7–2.4)

## 2020-09-19 LAB — PHOSPHORUS: Phosphorus: 3.6 mg/dL (ref 2.5–4.6)

## 2020-09-19 LAB — PREALBUMIN: Prealbumin: 9.5 mg/dL — ABNORMAL LOW (ref 18–38)

## 2020-09-19 LAB — TRIGLYCERIDES: Triglycerides: 101 mg/dL (ref <150)

## 2020-09-19 MED ORDER — IOHEXOL 9 MG/ML PO SOLN
500.0000 mL | Freq: Once | ORAL | Status: AC | PRN
  Administered 2020-09-19: 500 mL via ORAL

## 2020-09-19 MED ORDER — VITAL HIGH PROTEIN PO LIQD
1000.0000 mL | ORAL | Status: DC
  Administered 2020-09-19 (×2): 1000 mL

## 2020-09-19 MED ORDER — TRACE MINERALS CU-MN-SE-ZN 300-55-60-3000 MCG/ML IV SOLN
INTRAVENOUS | Status: AC
  Filled 2020-09-19: qty 832

## 2020-09-19 MED ORDER — FUROSEMIDE 10 MG/ML IJ SOLN
40.0000 mg | Freq: Three times a day (TID) | INTRAMUSCULAR | Status: AC
  Administered 2020-09-19 – 2020-09-20 (×3): 40 mg via INTRAVENOUS
  Filled 2020-09-19 (×3): qty 4

## 2020-09-19 MED ORDER — FENTANYL CITRATE (PF) 100 MCG/2ML IJ SOLN
50.0000 ug | Freq: Once | INTRAMUSCULAR | Status: AC
Start: 2020-09-19 — End: 2020-09-19
  Administered 2020-09-19: 100 ug via INTRAVENOUS
  Filled 2020-09-19: qty 2

## 2020-09-19 MED ORDER — ETOMIDATE 2 MG/ML IV SOLN
20.0000 mg | Freq: Once | INTRAVENOUS | Status: AC
  Administered 2020-09-19: 20 mg via INTRAVENOUS
  Filled 2020-09-19: qty 10

## 2020-09-19 NOTE — Plan of Care (Signed)
  Problem: Clinical Measurements: Goal: Postoperative complications will be avoided or minimized Outcome: Progressing   Problem: Respiratory: Goal: Ability to maintain a clear airway will improve Outcome: Progressing   Problem: Clinical Measurements: Goal: Ability to maintain clinical measurements within normal limits will improve Outcome: Progressing Goal: Will remain free from infection Outcome: Progressing Goal: Diagnostic test results will improve Outcome: Progressing Goal: Respiratory complications will improve Outcome: Progressing Goal: Cardiovascular complication will be avoided Outcome: Progressing

## 2020-09-19 NOTE — Procedures (Addendum)
Bronchoscopy Procedure Note  Ralph [name]  [id number]  01-17-1966  Date:09/19/20  Time:1:15 PM   Provider Performing:Huey Scalia C Tamala Julian   Procedure(s):  Flexible bronchoscopy with bronchial alveolar lavage [phone]) and Initial Therapeutic Aspiration of Tracheobronchial Tree [phone])  Indication(s) Mucus plugging of bronchi Abnormal CXR  Consent Risks of the procedure as well as the alternatives and risks of each were explained to the patient and/or caregiver.  Consent for the procedure was obtained and is signed in the bedside chart  Anesthesia Etomidate 63m, Fentanyl 58m   Time Out Verified patient identification, verified procedure, site/side was marked, verified correct patient position, special equipment/implants available, medications/allergies/relevant history reviewed, required imaging and test results available.   Sterile Technique Usual hand hygiene, masks, gowns, and gloves were used   Procedure Description Bronchoscope advanced through tracheostomy tube and into airway.  Airways were examined down to subsegmental level with findings noted below.   Following diagnostic evaluation, Therapeutic aspiration performed in RLL, RML, LUL, LLL  Findings:  - Segmental occlusion of bilateral lungs with thick tenacious mucus suctioned - Diffuse airway erythema/bronchitis - Extrinsic compression of LLL (pic)    Complications/Tolerance None; patient tolerated the procedure well. Chest X-ray is not needed post procedure.   EBL Minimal   Specimen(s) BAL LLL     Left mainstem ending, at 6 o'clock is fishmouth opening to LLL, able to be traversed with plugs throughout c/w extrinsic compression  DaErskine EmeryD PCCM

## 2020-09-19 NOTE — Progress Notes (Signed)
CT reviewed, will place pigtail in AM.  Myrla Halsted MD PCCM

## 2020-09-19 NOTE — NC FL2 (Addendum)
[place] LEVEL OF CARE SCREENING TOOL     IDENTIFICATION  Patient Name: Ralph [name] Birthdate: 12-06-1965 Sex: male Admission Date (Current Location): 09/01/2020  [facility] and [place] Number:  Herbalist and Address:  The . [facility], [place] [address], [place], [place] [address]      Provider Number: [id number]  Attending Physician Name and Address:  Lajuana Matte, MD  Relative Name and Phone Number:  Burnadette Pop [phone]    Current Level of Care: Hospital Recommended Level of Care: Vent SNF Prior Approval Number:    Date Approved/Denied:   PASRR Number: [id number]  Discharge Plan: Other (Comment) (Vent/SNF)    Current Diagnoses: Patient Active Problem List   Diagnosis Date Noted   Jejunostomy tube leak ([place])    Respiratory failure ([place])    Endotracheally intubated    Esophagectomy, anastomotic leak    Esophageal stricture 09/01/2020   Abdominal pain 06/30/2020   Cellulitis 06/23/2020   Protein-calorie malnutrition, severe 05/25/2020   Epididymitis 04/06/2020   Right inguinal hernia 09/14/2019   Depression, recurrent ([place]) 06/11/2019   GERD with esophagitis 05/27/2019   Bradycardia 05/04/2019   Chest pain, unspecified 05/04/2019   Does not have health insurance    Dysphagia    Hyponatremia    Tobacco use disorder    Neuropathic pain 08/29/2017   High risk social situation 08/29/2017   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   Pure hypercholesterolemia 11/26/2012   Cerebrovascular disease 11/25/2012   Peripheral vascular disease, unspecified ([place]) 11/25/2012   Coronary artery disease 11/25/2012   Tobacco abuse counseling 11/25/2012   Essential hypertension, benign 11/25/2012    Orientation RESPIRATION BLADDER Height & Weight     Self, Time, Situation, Place (intubated/trach)  Trach 40% Incontinent, External catheter (External Urinary catheter) Weight: 120 lb 13 oz (54.8 kg) Height:  5'  8" (172.7 cm)  BEHAVIORAL SYMPTOMS/MOOD NEUROLOGICAL BOWEL NUTRITION STATUS      Incontinent Diet (Please see discharge summary)  AMBULATORY STATUS COMMUNICATION OF NEEDS Skin   Total Care  (intubated/trach) Other (Comment) (catheter abdomen,chest,R,L,lower,gauze,ecchymosis,flank,L,PI sacrum posterior,mid deep tissue,foam lift dressing,clean,dry,intact,PRN,purple;Pink,erythema non-blanchable,incision closed abdomen other PRN,Incision closed chest R,Gauze,PRN)                       Personal Care Assistance Level of Assistance  Bathing, Feeding, Dressing Bathing Assistance: Maximum assistance Feeding assistance: Maximum assistance (NPO,J-tube) Dressing Assistance: Maximum assistance     Functional Limitations Info  Sight, Hearing, Speech     Speech Info: Impaired (intubated/trach)    SPECIAL CARE FACTORS FREQUENCY  PT (By licensed PT), OT (By licensed OT)     PT Frequency: 5x min weekly OT Frequency: 5x min weekly            Contractures Contractures Info: Not present    Additional Factors Info  Code Status, Allergies, Psychotropic, Insulin Sliding Scale Code Status Info: FULL Allergies Info: No Known Allergies Psychotropic Info: clonazepam (klonopin) tablet 33m 2 times daily, Quetiapine (seroquel) tablet 525m2 times daily Insulin Sliding Scale Info: insulin aspart (novoLOG) injection 0-24 units every 4 hours       Current Medications (09/19/2020):  This is the current hospital active medication list Current Facility-Administered Medications  Medication Dose Route Frequency Provider Last Rate Last Admin   0.9 %  sodium chloride infusion   Intravenous PRN LiLajuana MatteMD 10 mL/hr at 09/19/20 1100 Infusion Verify at 09/19/20 1100   acetaminophen (TYLENOL) 160 MG/5ML  solution 650 mg  650 mg Per Tube Q6H PRN Candee Furbish, MD   650 mg at 09/19/20 0327   cefTAZidime (FORTAZ) 2 g in sodium chloride 0.9 % 100 mL IVPB  2 g Intravenous Q8H Candee Furbish, MD    Stopped at 09/19/20 [phone]   chlorhexidine gluconate (MEDLINE KIT) (PERIDEX) 0.12 % solution 15 mL  15 mL Mouth Rinse BID Lajuana Matte, MD   15 mL at 09/19/20 0749   Chlorhexidine Gluconate Cloth 2 % PADS 6 each  6 each Topical Daily Jadene Pierini E, PA-C   6 each at 09/18/20 9211   clonazePAM (KLONOPIN) tablet 1 mg  1 mg Per Tube BID Candee Furbish, MD   1 mg at 09/19/20 9417   docusate (COLACE) 50 MG/5ML liquid 100 mg  100 mg Per Tube BID Erick Colace, NP   100 mg at 09/18/20 2030   enoxaparin (LOVENOX) injection 30 mg  30 mg Subcutaneous QHS Gold, Wayne E, PA-C   30 mg at 09/18/20 2100   feeding supplement (VITAL HIGH PROTEIN) liquid 1,000 mL  1,000 mL Per Tube Continuous Candee Furbish, MD       fentaNYL (SUBLIMAZE) injection 25 mcg  25 mcg Intravenous Q1H PRN Noemi Chapel P, DO   25 mcg at 09/18/20 1502   furosemide (LASIX) injection 40 mg  40 mg Intravenous Q8H Candee Furbish, MD       Gerhardt's butt cream   Topical QID Jadene Pierini E, PA-C   1 application at 40/81/44 8185   HYDROmorphone (DILAUDID) tablet 4 mg  4 mg Per Tube Q4H Candee Furbish, MD   4 mg at 09/19/20 1130   insulin aspart (novoLOG) injection 0-24 Units  0-24 Units Subcutaneous Q4H Gold, Patrick Jupiter E, PA-C   2 Units at 09/19/20 1130   levalbuterol (XOPENEX) nebulizer solution 0.63 mg  0.63 mg Nebulization Q6H PRN Gold, Patrick Jupiter E, PA-C   0.63 mg at 09/07/20 1020   liver oil-zinc oxide (DESITIN) 40 % ointment   Topical BID Lajuana Matte, MD   Given at 09/19/20 6314   MEDLINE mouth rinse  15 mL Mouth Rinse 10 times per day Lajuana Matte, MD   15 mL at 09/19/20 1131   metoCLOPramide (REGLAN) injection 10 mg  10 mg Intravenous Q6H Gold, Wayne E, PA-C   10 mg at 09/19/20 1131   midodrine (PROAMATINE) tablet 10 mg  10 mg Per J Tube TID Mitzi Hansen, MD   10 mg at 09/19/20 0917   pantoprazole (PROTONIX) injection 40 mg  40 mg Intravenous Q24H Erick Colace, NP   40 mg at 09/19/20 0917   polyethylene glycol  (MIRALAX / GLYCOLAX) packet 17 g  17 g Per Tube BID Candee Furbish, MD   17 g at 09/17/20 2246   QUEtiapine (SEROQUEL) tablet 50 mg  50 mg Per Tube BID Candee Furbish, MD   50 mg at 09/19/20 0917   sennosides (SENOKOT) 8.8 MG/5ML syrup 10 mL  10 mL Per Tube BID Candee Furbish, MD   10 mL at 09/18/20 0932   sodium chloride flush (NS) 0.9 % injection 10-40 mL  10-40 mL Intracatheter Q12H Lightfoot, Harrell O, MD   10 mL at 09/19/20 0930   sodium chloride flush (NS) 0.9 % injection 10-40 mL  10-40 mL Intracatheter PRN Lightfoot, Lucile Crater, MD       tobramycin (PF) (TOBI) nebulizer solution 300 mg  300 mg Nebulization  BID Candee Furbish, MD   300 mg at 61/22/44 9753   TPN CYCLIC-ADULT (ION)   Intravenous Cyclic-See Admin Instructions Henri Medal [facility]         Discharge Medications: Please see discharge summary for a list of discharge medications.  Relevant Imaging Results:  Relevant Lab Results:   Additional Information [id number]  Trula Ore, LCSWA

## 2020-09-19 NOTE — Plan of Care (Signed)

## 2020-09-19 NOTE — Progress Notes (Signed)
SLP Cancellation Note  Patient Details Name: Ralph [name] MRN: [id number] DOB: 15-Aug-1965   Cancelled treatment:       Reason Eval/Treat Not Completed: Patient not medically ready. Checked in with RN, who says that pt is on the vent with plans for bronchoscopy this afternoon. Will f/u as able for PVM eval.     Mahala Menghini., M.A. CCC-SLP Acute Rehabilitation Services Pager [phone] Office [phone]  09/19/2020, 12:29 PM

## 2020-09-19 NOTE — Progress Notes (Signed)
RT assisted Dr. Katrinka Blazing with bedside bronchoscopy. Sputum culture obtained and walked to lab at this time. Pt tolerated procedure well. RT will continue to monitor and be available as needed.

## 2020-09-19 NOTE — Progress Notes (Signed)
[address]       [place] [address]             [phone]                 12 Days Post-Op Procedure(s) (LRB): XI ROBOTIC ASSISTED THORASCOPY-DECORTICATION & REVISION OF ESOPHAGECTOMY (Right) ESOPHAGOGASTRODUODENOSCOPY (EGD) (N/A) INTERCOSTAL NERVE BLOCK (Right)   Events: No events. Mild bilious output from JP. Minimal CT output Remains tachypnic on pressure support  _______________________________________________________________ Vitals: BP 100/63   Pulse 68   Temp 98.8 F (37.1 C) (Oral)   Resp 19   Ht 5\' 8"  (1.727 m)   Wt 54.8 kg   SpO2 97%   BMI 18.37 kg/m   - Neuro: awake  - Cardiovascular: Sinus  Drips:       - Pulm:  Vent Mode: PRVC FiO2 (%):  [40 %] 40 % Set Rate:  [15 bmp] 15 bmp Vt Set:  [540 mL] 540 mL PEEP:  [5 cmH20] 5 cmH20 Pressure Support:  [18 cmH20] 18 cmH20 Plateau Pressure:  [20 cmH20] 20 cmH20  ABG    Component Value Date/Time   PHART 7.414 09/08/2020 0444   PCO2ART 32.8 09/08/2020 0444   PO2ART 98 09/08/2020 0444   HCO3 20.7 09/08/2020 0444   TCO2 22 09/08/2020 0444   ACIDBASEDEF 3.0 (H) 09/08/2020 0444   O2SAT 97.0 09/08/2020 0444    - Abd: Soft - Extremity: Cool  .Intake/Output      08/14 0701 08/15 0700 08/15 0701 08/16 0700   I.V. (mL/kg) 1169.6 (21.3)    Other 200    NG/GT     IV Piggyback 200.1    Total Intake(mL/kg) 1569.6 (28.6)    Urine (mL/kg/hr) 2600 (2)    Drains 16    Stool     Chest Tube 50    Total Output 2666    Net -1096.4         Urine Occurrence 1 x       _______________________________________________________________ Labs: CBC Latest Ref Rng & Units 09/19/2020 09/18/2020 09/17/2020  WBC 4.0 - 10.5 K/uL 12.4(H) 13.8(H) 11.0(H)  Hemoglobin 13.0 - 17.0 g/dL 8.2(L) 8.4(L) 7.9(L)  Hematocrit 39.0 - 52.0 % 25.8(L) 26.0(L) 23.7(L)  Platelets 150 - 400 K/uL 879(H) 884(H) 751(H)   CMP Latest Ref Rng & Units 09/19/2020 09/18/2020 09/17/2020  Glucose 70 - 99 mg/dL 09/19/2020)  937(J) 696(V)  BUN 6 - 20 mg/dL 893(Y) 10(F) 75(Z)  Creatinine 0.61 - 1.24 mg/dL 02(H) 8.52(D) 7.82(U)  Sodium 135 - 145 mmol/L 137 139 137  Potassium 3.5 - 5.1 mmol/L 4.3 3.7 3.8  Chloride 98 - 111 mmol/L 103 99 98  CO2 22 - 32 mmol/L 27 30 31   Calcium 8.9 - 10.3 mg/dL 2.35(T) 8.9 )  Total Protein 6.5 - 8.1 g/dL 5.9(L) 6.0(L) 5.4(L)  Total Bilirubin 0.3 - 1.2 mg/dL 1.0 1.2 6.1(W)  Alkaline Phos 38 - 126 U/L 131(H) 117 86  AST 15 - 41 U/L 47(H) 35 37  ALT 0 - 44 U/L 50(H) 43 47(H)    CXR: -  _______________________________________________________________  Assessment and Plan: POD 17 s/p robotic Ivor Lewis esophagectomy, postop day 11 status post reexploration with reinforcement of anastomosis.  Neuro: Wean sedation as tolerated CV: off levo Pulm: consider bronch to clear L lung Renal: Creatinine stable. GI:  On TPN.  J-tube feeds held  On bowel regimen.   Heme: Stable ID: on fortaz and tobra Endo: Sliding scale insulin Dispo: Continue  ICU care.   Corliss Skains 09/19/2020 7:55 AM

## 2020-09-19 NOTE — TOC Initial Note (Signed)
Transition of Care [facility]) - Initial/Assessment Note    Patient Details  Name: Ralph [name] MRN: [id number] Date of Birth: 1965-07-09  [facility] CM/SW Contact:    Terrial Rhodes, LCSWA Phone Number: 09/19/2020, 12:44 PM  Clinical Narrative:                  CSW spoke with patients significant other regarding Vent/SNF placement. Patients significant other agreeable to vent/snf placement for patient.Patients significant other gave CSW permission to fax out initial referral to [facility].  CSW spoke with Clydie Braun with [facility] who confirmed CSW can fax over initial referral. CSW faxed over clinicals to Clydie Braun at [facility] for review for possible vent/snf placement. Expected Discharge Plan:  (Vent/SNF) Barriers to Discharge: Continued Medical Work up   Patient Goals and CMS Choice   CMS Medicare.gov Compare Post Acute Care list provided to:: Patient Represenative (must comment) (patientts significant other [name]) Choice offered to / list presented to :  (Patients signigicant other [name])  Expected Discharge Plan and Services Expected Discharge Plan:  (Vent/SNF) In-house Referral: Clinical Social Work     Living arrangements for the past 2 months: Single Family Home                                      Prior Living Arrangements/Services Living arrangements for the past 2 months: Single Family Home Lives with:: Self, Significant Other ([name]) Patient language and need for interpreter reviewed:: Yes Do you feel safe going back to the place where you live?: No   Vent/SNF  Need for Family Participation in Patient Care: Yes (Comment) Care giver support system in place?: Yes (comment)   Criminal Activity/Legal Involvement Pertinent to Current Situation/Hospitalization: No - Comment as needed  Activities of Daily Living Home Assistive Devices/Equipment: Walker (specify type), Scales ADL Screening (condition at time of admission) Patient's cognitive ability  adequate to safely complete daily activities?: Yes Is the patient deaf or have difficulty hearing?: No Does the patient have difficulty seeing, even when wearing glasses/contacts?: No Does the patient have difficulty concentrating, remembering, or making decisions?: No Patient able to express need for assistance with ADLs?: Yes Does the patient have difficulty dressing or bathing?: No Independently performs ADLs?: Yes (appropriate for developmental age) Does the patient have difficulty walking or climbing stairs?: No Weakness of Legs: Both Weakness of Arms/Hands: None  Permission Sought/Granted Permission sought to share information with : Case Manager, Family Supports, Oceanographer granted to share information with : No (Patient intubated/trach)  Share Information with NAME: Patient intubated/trach spoke with patients significant other [name]  Permission granted to share info w AGENCY: Patient intubated/trach spoke with patients significant other [name]    Vent/SNF  Permission granted to share info w Relationship: Patient intubated/trach spoke with patients significant other [name]  Permission granted to share info w Contact Information: Patient intubated/trach spoke with patients significant other [name] [phone]  Emotional Assessment Appearance:: Appears stated age Attitude/Demeanor/Rapport: Gracious Affect (typically observed): Calm Orientation: : Oriented to Self, Oriented to Place, Oriented to  Time, Oriented to Situation (intubated/trach) Alcohol / Substance Use: Not Applicable Psych Involvement: No (comment)  Admission diagnosis:  Esophageal stricture [K22.2] Patient Active Problem List   Diagnosis Date Noted   Jejunostomy tube leak (HCC)    Respiratory failure (HCC)    Endotracheally intubated    Esophagectomy, anastomotic leak    Esophageal stricture  09/01/2020   Abdominal pain 06/30/2020   Cellulitis 06/23/2020   Protein-calorie  malnutrition, severe 05/25/2020   Epididymitis 04/06/2020   Right inguinal hernia 09/14/2019   Depression, recurrent (HCC) 06/11/2019   GERD with esophagitis 05/27/2019   Bradycardia 05/04/2019   Chest pain, unspecified 05/04/2019   Does not have health insurance    Dysphagia    Hyponatremia    Tobacco use disorder    Neuropathic pain 08/29/2017   High risk social situation 08/29/2017   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   Pure hypercholesterolemia 11/26/2012   Cerebrovascular disease 11/25/2012   Peripheral vascular disease, unspecified (HCC) 11/25/2012   Coronary artery disease 11/25/2012   Tobacco abuse counseling 11/25/2012   Essential hypertension, benign 11/25/2012   PCP:  Pcp, No Pharmacy:   [facility] - [place], [place] - [address] [facility] [place] [address] Phone: [phone] Fax: [phone]  [facility] - [place], [place] - [address] [place] [address] Phone: [phone] Fax: [phone]  [id number] - [place], [place] - [address] [place] [address] Phone: [phone] Fax: [phone]     Social Determinants of Health (SDOH) Interventions    Readmission Risk Interventions No flowsheet data found.

## 2020-09-19 NOTE — Progress Notes (Signed)
NAME:  Ralph [name], MRN:  [id number], DOB:  May 31, 1965, LOS: 18 ADMISSION DATE:  09/01/2020, CONSULTATION DATE:  8/3 REFERRING MD:  Dr. Cliffton Asters, CHIEF COMPLAINT:  esophageal rupture, esophageal stricture    History of Present Illness:  Patient is a 55 yo M w/ PMH anxiety, CAD, MI, stroke presents to [facility] on 7/28 for esophagectomy w/ Dr. Cliffton Asters for esophageal stricture.  On 7/28, Dr. Cliffton Asters performed the esophagectomy without complication.  8/3 patient taken back to OR for esophageal rupture s/p esophagectomy by Dr. Cliffton Asters. Unable to extubate post procedure. Patient remains intubated on mechanical ventilation. ABG with respiratory acidosis.    PCCM consulted for vent management. He remains critically ill  Pertinent  Medical History   Past Medical History:  Diagnosis Date   Anxiety    Coronary artery disease    Myocardial infarction [facility])    Stroke (HCC)     Significant Hospital Events: Including procedures, antibiotic start and stop dates in addition to other pertinent events   7/28 admitted for planned esophagectomy. Chest tube was placed on right chest. J tube in place. 2 JP drains in place. Patient was extubated post procedure. Patient developed subcutaneous emphysema. Unable to use J tube due to leakage around site. 8/3: PCCM consulted for vent management after pt brought back to OR for esophageal rupture/ leak. Vanc/zosyn/fluconazole started. Hypotension noted post-op. Felt 2/2 Sepsis; treated w/ volume and pressors.  8/4: Still on norepinephrine.  Central venous pressure suggesting he is still hypovolemic so receiving fluid.  Spontaneous breathing trial initiated but not ready for extubation due to work of breathing.  culture sent from chest tube.  J-tube replaced in interventional radiology due to leaking.  Tube feeds resumed in the afternoon 8/5: J-tube site again leaking, tube feeds placed on hold around midnight.Still pressor dependent . Passed SBT. WOB improved.   Failed extubation and was reintubated 8/8: J-tube upsized 8/9: Tracheostomy 8/10 J-tube continues to leak. TF stopped. RT IJ CVC removed. 8/13 starting trickle feeds  Antibiotics: Cefoxitin 7/28-7/29 Cefazolin 7/29 Fluconazole 8/3>> Gentamicin 8/3 Zosyn 8/3>>8/10 Vancomycin 8/3-8/7 Ceftazidime 8/10>> Tobramycin nebs 8/10>>  Interim History / Subjective:  Afebrile overnight. Reporting continue chest pain, unchanged over past few days. Denies shortness of breath or difficulty breathing. Pressure support this AM.   Objective   Blood pressure 100/63, pulse 68, temperature 98.8 F (37.1 C), temperature source Oral, resp. rate 19, height 5\' 8"  (1.727 m), weight 54.8 kg, SpO2 97 %.    Vent Mode: PRVC FiO2 (%):  [40 %] 40 % Set Rate:  [15 bmp] 15 bmp Vt Set:  [540 mL] 540 mL PEEP:  [5 cmH20] 5 cmH20 Pressure Support:  [18 cmH20] 18 cmH20 Plateau Pressure:  [20 cmH20] 20 cmH20   Intake/Output Summary (Last 24 hours) at 09/19/2020 0834 Last data filed at 09/19/2020 0400 Gross per 24 hour  Intake 1559.62 ml  Output 2666 ml  Net -1106.38 ml   Filed Weights   09/17/20 0239 09/18/20 0500 09/19/20 0600  Weight: 51.5 kg 55 kg 54.8 kg    Examination: General: thin, frail, ill appearing man lying in bed HENT:[place], AT Lungs: coarse breath sounds bilaterally, tachypneic  Cardiovascular: RRR, no M/R/G Abdomen: soft, NT, non distended, convex  Extremities: thin, no edema noted, warm and dry  Neuro: awake, following commands, moving all extremities spontaneously with purpose GU: condom cath in place  Resolved Hospital Problem list   Bradycardia Constipation   Assessment & Plan:  Acute respiratory failure w/ hypoxia and hypercarbia  requiring mechanical ventilation.  S/p tracheostomy (8/9) HAP- Pan-sensitive Pseudomonas pneumonia Afebrile for 24 hours.   -Con't LTVV, 4-8cc/kg IBW with goal Pplat<30 and DP<15. -working on vent weaning; off of vent for 8 hours yesterday  -routine  trach care -VAP prevention protocol -PRN xopenex - Con't weaning enteral sedation- seroquel, dilaudid, clonazepam - Complete 14 days of antibiotics for pseudomonas.  Completed 7 days of Zosyn.  Continue ceftazidime for at least 7 days total (Last dose 8/17) Continue Tobramycin nebs for 14 days (last dose 8/27).  Needs  follow up cultures- planning to treat to cure given first episode of Pseudomonas. -Continue mobilization with PT   Bilateral pleural effusions; left too small to intervene on - lasix TID again today - consider CT for further evaluation of pleural effusions if respiratory status not improving or increasing WBC/new fevers   Constipation- resolved KUB with no abnormal gas pattern. Has had multiple BM in past 48 hours. -Continue reglan -Continue miralax, sennosides, and docusate    Esophageal stricture s/p esophagectomy complicated by post operative esophageal rupture and J-tube leakage J-tube upsized 8/8. Tube feeding stopped again on 8/11 due to leak around J tube, yellow/green drainage overnight per nursing. 8/13 restarted trickle tube feeds, but stopped due to pain. Pain did not resolve after TF cessation and TF restarted 8/14.  Severe protein calorie malnutrition, predates surgery   - Re-initiate TF at 20 mL/hr - Con't TPN until able to meet needs enterally.    Medication induced hypotension- stable Septic shock; polymicrobial, Pseudomonas, E. coli, strep angiosis-resolved Plan: -Continue midodrine 10mg  TID. -Continue Ceftazidime IV and tobramycin nebs. - off of pressors   Thrombocytosis, reactive PLT [id number]. Suspect stress response to sepsis. -Monitor on AM CBC   Post operative anemia, Chronic anemia HGB 8.2>8.6>8>8>7.9>8.4>8.2. No signs of active bleeding -follow up H/H -transfuse for Hgb <7%, active bleeding   Hyperbilirubinemia, resolved Transaminase elevation- potentially congestive hepatopathy -con't to monitor -diuresis    Hyperglycemia, controlled with minimal insulin requirements -goal BG 140-180 -SSI   Pressure Wound - Sacrum -Continue q2h turn -Continue foam dressing -Wound care consult placed. Therapy per wound care orders.  Best Practice (right click and "Reselect all SmartList Selections" daily)   Diet/type: tubefeeds and TPN DVT prophylaxis: LMWH GI prophylaxis: PPI Lines: NG, J tube, PICC Foley:  Condom cath Code Status:  full code Last date of multidisciplinary goals of care discussion [per primary]  Labs   CBC: Recent Labs  Lab 09/15/20 0324 09/16/20 0346 09/17/20 0406 09/18/20 0318 09/19/20 0444  WBC 13.6* 12.4* 11.0* 13.8* 12.4*  NEUTROABS  --   --   --   --  9.2*  HGB 8.0* 8.0* 7.9* 8.4* 8.2*  HCT 24.6* 25.0* 23.7* 26.0* 25.8*  MCV 95.0 93.3 92.2 92.9 93.8  PLT 496* 666* 751* 884* 879*    Basic Metabolic Panel: Recent Labs  Lab 09/13/20 0539 09/14/20 0552 09/15/20 0324 09/16/20 0346 09/16/20 1720 09/17/20 0406 09/18/20 0318 09/19/20 0444  NA 134* 136 134* 136 136 137 139 137  K 3.8 3.7 4.8 4.2 3.7 3.8 3.7 4.3  CL 93* 93* 97* 95* 96* 98 99 103  CO2 32 36* 33* 33* 32 31 30 27   GLUCOSE 129* 115* 225* 143* 102* 109* 128* 132*  BUN 12 16 16 18 18  21* 24* 26*  CREATININE 0.45* 0.61 0.57* 0.58* 0.64 0.48* 0.57* 0.46*  CALCIUM 8.5* 8.4* 8.3* 8.9 8.8* 8.7* 8.9 8.6*  MG 1.7 1.8 2.1  --   --  2.1  --  2.4  PHOS  --   --  3.7  --   --  3.9  --  3.6   GFR: Estimated Creatinine Clearance: 83.7 mL/min (A) (by C-G formula based on SCr of 0.46 mg/dL (L)). Recent Labs  Lab 09/16/20 0346 09/17/20 0406 09/18/20 0318 09/19/20 0444  WBC 12.4* 11.0* 13.8* 12.4*    Liver Function Tests: Recent Labs  Lab 09/15/20 0324 09/16/20 0346 09/17/20 0406 09/18/20 0318 09/19/20 0444  AST 71* 57* 37 35 47*  ALT 56* 56* 47* 43 50*  ALKPHOS 86 95 86 117 131*  BILITOT 2.6* 2.0* 1.4* 1.2 1.0  PROT 4.7* 5.3* 5.4* 6.0* 5.9*  ALBUMIN 1.8* 1.9* 1.9* 2.0* 2.0*   No results for  input(s): LIPASE, AMYLASE in the last 168 hours. No results for input(s): AMMONIA in the last 168 hours.  ABG    Component Value Date/Time   PHART 7.414 09/08/2020 0444   PCO2ART 32.8 09/08/2020 0444   PO2ART 98 09/08/2020 0444   HCO3 20.7 09/08/2020 0444   TCO2 22 09/08/2020 0444   ACIDBASEDEF 3.0 (H) 09/08/2020 0444   O2SAT 97.0 09/08/2020 0444     Coagulation Profile: No results for input(s): INR, PROTIME in the last 168 hours.  Cardiac Enzymes: No results for input(s): CKTOTAL, CKMB, CKMBINDEX, TROPONINI in the last 168 hours.  HbA1C: Hgb A1c MFr Bld  Date/Time Value Ref Range Status  04/28/2019 04:03 PM 4.9 4.8 - 5.6 % Final    Comment:    (NOTE) Pre diabetes:          5.7%-6.4% Diabetes:              >6.4% Glycemic control for   <7.0% adults with diabetes     CBG: Recent Labs  Lab 09/18/20 1121 09/18/20 1533 09/18/20 2006 09/18/20 2351 09/19/20 0444  GLUCAP 90 76 109* 118* 119*    Past Medical History:  He,  has a past medical history of Anxiety, Coronary artery disease, Myocardial infarction (HCC), and Stroke (HCC).   Surgical History:   Past Surgical History:  Procedure Laterality Date   APPENDECTOMY     BALLOON DILATION N/A 06/01/2019   Procedure: BALLOON DILATION;  Surgeon: Vida Rigger, MD;  Location: [facility] ENDOSCOPY;  Service: Endoscopy;  Laterality: N/A;   BALLOON DILATION N/A 07/16/2019   Procedure: BALLOON DILATION;  Surgeon: Vida Rigger, MD;  Location: [facility];  Service: Endoscopy;  Laterality: N/A;   BALLOON DILATION N/A 09/09/2019   Procedure: BALLOON DILATION;  Surgeon: Vida Rigger, MD;  Location: [facility];  Service: Endoscopy;  Laterality: N/A;   BIOPSY  04/29/2019   Procedure: BIOPSY;  Surgeon: Charlott Rakes, MD;  Location: [facility] ENDOSCOPY;  Service: Endoscopy;;   BIOPSY  05/27/2019   Procedure: BIOPSY;  Surgeon: [name], MD;  Location: [facility];  Service: Endoscopy;;   BIOPSY  06/01/2019   Procedure: BIOPSY;  Surgeon:  Vida Rigger, MD;  Location: [facility];  Service: Endoscopy;;   BIOPSY  07/16/2019   Procedure: BIOPSY;  Surgeon: Vida Rigger, MD;  Location: [facility];  Service: Endoscopy;;   BIOPSY  05/27/2020   Procedure: BIOPSY;  Surgeon: Napoleon Form, MD;  Location[facility] ENDOSCOPY;  Service: Endoscopy;;   ESOPHAGOGASTRODUODENOSCOPY N/A 06/01/2019   Procedure: ESOPHAGOGASTRODUODENOSCOPY (EGD);  Surgeon: Vida Rigger, MD;  Location: [facility] ENDOSCOPY;  Service: Endoscopy;  Laterality: N/A;   ESOPHAGOGASTRODUODENOSCOPY N/A 05/31/2020   Procedure: ESOPHAGOGASTRODUODENOSCOPY (EGD);  Surgeon: Corliss Skains, MD;  Location: [facility] OR;  Service: Thoracic;  Laterality: N/A;  ESOPHAGOGASTRODUODENOSCOPY N/A 09/01/2020   Procedure: ESOPHAGOGASTRODUODENOSCOPY (EGD);  Surgeon: Corliss Skains, MD;  Location: [facility] OR;  Service: Thoracic;  Laterality: N/A;   ESOPHAGOGASTRODUODENOSCOPY N/A 09/07/2020   Procedure: ESOPHAGOGASTRODUODENOSCOPY (EGD);  Surgeon: Corliss Skains, MD;  Location: [facility] OR;  Service: Thoracic;  Laterality: N/A;   ESOPHAGOGASTRODUODENOSCOPY (EGD) WITH PROPOFOL N/A 04/29/2019   Procedure: ESOPHAGOGASTRODUODENOSCOPY (EGD) WITH PROPOFOL;  Surgeon: Charlott Rakes, MD;  Location: [facility] ENDOSCOPY;  Service: Endoscopy;  Laterality: N/A;   ESOPHAGOGASTRODUODENOSCOPY (EGD) WITH PROPOFOL N/A 05/27/2019   Procedure: ESOPHAGOGASTRODUODENOSCOPY (EGD) WITH PROPOFOL;  Surgeon: [name], MD;  Location: [facility];  Service: Endoscopy;  Laterality: N/A;   ESOPHAGOGASTRODUODENOSCOPY (EGD) WITH PROPOFOL N/A 07/16/2019   Procedure: ESOPHAGOGASTRODUODENOSCOPY (EGD) WITH PROPOFOL;  Surgeon: Vida Rigger, MD;  Location: [facility];  Service: Endoscopy;  Laterality: N/A;   ESOPHAGOGASTRODUODENOSCOPY (EGD) WITH PROPOFOL N/A 09/09/2019   Procedure: ESOPHAGOGASTRODUODENOSCOPY (EGD) WITH PROPOFOL;  Surgeon: Vida Rigger, MD;  Location: [facility];  Service: Endoscopy;  Laterality: N/A;   ESOPHAGOGASTRODUODENOSCOPY  (EGD) WITH PROPOFOL N/A 05/27/2020   Procedure: ESOPHAGOGASTRODUODENOSCOPY (EGD) WITH PROPOFOL;  Surgeon: Napoleon Form, MD;  Location: [facility] ENDOSCOPY;  Service: Endoscopy;  Laterality: N/A;   FINE NEEDLE ASPIRATION  09/09/2019   Procedure: FINE NEEDLE ASPIRATION (FNA) LINEAR;  Surgeon: Vida Rigger, MD;  Location: [facility] ENDOSCOPY;  Service: Endoscopy;;   FOREIGN BODY REMOVAL  07/16/2019   Procedure: FOREIGN BODY REMOVAL;  Surgeon: Vida Rigger, MD;  Location: [facility] ENDOSCOPY;  Service: Endoscopy;;   FOREIGN BODY REMOVAL  05/27/2020   Procedure: FOREIGN BODY REMOVAL;  Surgeon: Napoleon Form, MD;  Location: [facility] ENDOSCOPY;  Service: Endoscopy;;   INTERCOSTAL NERVE BLOCK Right 09/01/2020   Procedure: INTERCOSTAL NERVE BLOCK;  Surgeon: Corliss Skains, MD;  Location: [facility] OR;  Service: Thoracic;  Laterality: Right;   INTERCOSTAL NERVE BLOCK Right 09/07/2020   Procedure: INTERCOSTAL NERVE BLOCK;  Surgeon: Corliss Skains, MD;  Location: [facility] OR;  Service: Thoracic;  Laterality: Right;   IR REPLC DUODEN/JEJUNO TUBE PERCUT W/FLUORO  09/08/2020   IR REPLC DUODEN/JEJUNO TUBE PERCUT W/FLUORO  09/12/2020   LAPAROSCOPIC JEJUNOSTOMY N/A 05/31/2020   Procedure: LAPAROSCOPIC JEJUNOSTOMY;  Surgeon: Corliss Skains, MD;  Location: [facility] OR;  Service: Thoracic;  Laterality: N/A;  EGD, C-arm required.   SHOULDER SURGERY     UPPER ESOPHAGEAL ENDOSCOPIC ULTRASOUND (EUS) N/A 09/09/2019   Procedure: UPPER ESOPHAGEAL ENDOSCOPIC ULTRASOUND (EUS);  Surgeon: [name], MD;  Location: [facility] ENDOSCOPY;  Service: Endoscopy;  Laterality: N/A;     Social History:   reports that he has been smoking cigarettes. He has a 20.00 pack-year smoking history. He has never used smokeless tobacco. He reports current alcohol use of about 2.0 standard drinks per week. He reports that he does not currently use drugs.   Family History:  His family history includes Healthy in his mother; Heart attack in his father.   Allergies No Known  Allergies   Home Medications  Prior to Admission medications   Medication Sig Start Date End Date Taking? Authorizing Provider  diclofenac Sodium (VOLTAREN) 1 % GEL Apply 2 g topically daily as needed (pain to left axillary region). 06/10/20  Yes Derrel Nip, MD  gabapentin (NEURONTIN) 250 MG/5ML solution Place 6 mLs (300 mg total) into feeding tube every 8 (eight) hours. 07/08/20  Yes Lightfoot, Eliezer Lofts, MD  lidocaine (LIDODERM) 5 % Place 1 patch onto the skin daily. Remove & Discard patch within 12 hours or as directed by MD 06/14/20  Yes [name],  Santiago BumpersWilliam A, MD  Nutritional Supplements (FEEDING SUPPLEMENT, JEVITY 1.2 CAL,) LIQD Place 1,000 mLs into feeding tube continuous. 06/10/20  Yes Derrel Nipresenzo, Victor, MD  oxyCODONE (ROXICODONE) 5 MG/5ML solution Place 5 mLs (5 mg total) into feeding tube at bedtime as needed for severe pain. Patient taking differently: Place 5 mg into feeding tube 2 (two) times daily as needed for severe pain. 07/08/20  Yes Lightfoot, Eliezer LoftsHarrell O, MD  polyvinyl alcohol (LIQUIFILM TEARS) 1.4 % ophthalmic solution Place 1 drop into both eyes as needed for dry eyes.   Yes [provider]  acetaminophen (TYLENOL) 160 MG/5ML suspension Place 20.3 mLs (650 mg total) into feeding tube every 6 (six) hours. Patient not taking: No sig reported 06/10/20   Derrel Nipresenzo, Victor, MD  mupirocin ointment (BACTROBAN) 2 % Apply 1 application topically 2 (two) times daily. Patient not taking: No sig reported 06/23/20   Moses MannersHensel, William A, MD  pantoprazole sodium (PROTONIX) 40 mg/20 mL PACK Place 20 mLs (40 mg total) into feeding tube 2 (two) times daily. Patient not taking: No sig reported 06/10/20   Derrel Nipresenzo, Victor, MD  sennosides (SENOKOT) 8.8 MG/5ML syrup Place 5 mLs into feeding tube daily. Patient not taking: No sig reported 06/11/20   Derrel Nipresenzo, Victor, MD  sucralfate (CARAFATE) 1 GM/10ML suspension Place 10 mLs (1 g total) into feeding tube 4 (four) times daily -  with meals and at  bedtime. Patient not taking: No sig reported 07/08/20   Corliss SkainsLightfoot, Harrell O, MD

## 2020-09-19 NOTE — H&P (Signed)
Patient transported to CT and back to 2H06 without any apparent complications.

## 2020-09-19 NOTE — Progress Notes (Addendum)
PHARMACY - TOTAL PARENTERAL NUTRITION CONSULT NOTE   Indication:  intolerance to enteral feeding , leaking J tube, s/p esophagectomy  Patient Measurements: Height: _0  (172.7 cm) Weight: 54.8 kg (120 lb 13 oz) IBW/kg (Calculated) : 68.4 TPN AdjBW (KG): 61 Body mass index is 18.37 kg/m. Usual Weight: 52 kg  Assessment:  55 yom with severe malnutrition and hx benign esophageal stricture with J-tube who is s/p planned robotic Ivor Lewis esophagectomy on 7/28. TF were held due to drainage around J-tube on 7/30, resumed at lower rate on 7/31. TF held again on 8/2 for continued drainage around J-tube. He was taken back to the OR on 8/3 for revision of anastomotic leak. J-tube replaced in IR on 8/4 but continues to have drainage. Significant refeeding risk noted. Pharmacy consulted for TPN.   Glucose / Insulin: no hx DM, CBGs 109-132 while on TPN, 90 while off TPN. No insulin in TPN. 0 units insulin / 24 hrs.  Electrolytes: K 4.3. CoCa 10.2. Mg 2.4.  Renal: SCr <1. BUN 26.  - s/p furosemide 5m IV q8h x3 Hepatic: AST/ALT 47/50 - slight inc. Alk Phos 131 - slight inc. Tbili 1. Albumin 2. Prealbumin 9.5. TG 101.  Intake / Output; MIVF: UOP 2 ml/kg/hr. Chest tube 50 ml. RLQ JP drain 0 ml. RUQ JP drain 16 ml. LBM 8/14 - J tube leaking continues noted by MD 8/13--TF tried 8/13 then turned back off. GI Meds: Docusate BID, Relgan 163mIV q6hr, PPI IV/24h, Senna BID, Miralax BID  GI Imaging: 8/3 CT abd/pelvis: small gas-filled collection on the left side of the mediastinum probably represents a small pocket at the esophageal-gastric anastomosis, large amount of pneumoperitoneum 8/10 Jtube positioning ok 8/12 abd x-ray: normal bowel gas pattern  GI Surgeries / Procedures:  8/4 J-tube replaced in IR  Central access: double lumen CVC 09/01/20, triple lumen PICC 8/5 TPN start date: 09/09/20  Nutritional Goals (per RD recommendation on 8/12): kCal: 1950-2250 kcals Protein:  110-125 g, Fluid: >= 2  L Goal TPN rate is 95 mL/hr (provides 112 g of protein and 2216 kcals per day)  Current Nutrition:  TPN - Tube feeding held due to leaking around J-tube  Plan:  Continue concentrate cyclic TPN over 18 hrs  This TPN provides 125 g of protein, 268.8 g of dextrose (max GIR 5.12), and 63.4 g of lipids (31%)  for a total of 2047 kcals meeting 100% of patient needs per goals set 8/12. Electrolytes in TPN: Decrease Mg to 5 mEq/L. Continue 80 mEq/L Na, 65 mEq/L K, 18 mmol/L Phos, 0 mEq/L Ca. Max Cl.  Add standard MVI and trace elements to TPN Continue TCTS SSI q4h SSI and adjust as needed  Monitor CBGs closely off TPN for hypoglycemia and need to increase TPN cyclic time Monitor TPN labs on Mon/Thurs. Check BMP, Mg tomorrow - Will discontinue prealbumin every Monday and CBC with dif every Monday based on updated order set panel.  Follow up resuming tube feed trial   GrCristela FeltPharmD, BCPS Clinical Pharmacist 09/19/2020 7:41 AM

## 2020-09-20 ENCOUNTER — Inpatient Hospital Stay (HOSPITAL_COMMUNITY): Payer: Medicaid Other

## 2020-09-20 DIAGNOSIS — Z9689 Presence of other specified functional implants: Secondary | ICD-10-CM | POA: Diagnosis not present

## 2020-09-20 DIAGNOSIS — L8990 Pressure ulcer of unspecified site, unspecified stage: Secondary | ICD-10-CM | POA: Insufficient documentation

## 2020-09-20 DIAGNOSIS — K222 Esophageal obstruction: Secondary | ICD-10-CM | POA: Diagnosis not present

## 2020-09-20 DIAGNOSIS — J9601 Acute respiratory failure with hypoxia: Secondary | ICD-10-CM | POA: Diagnosis not present

## 2020-09-20 LAB — BODY FLUID CELL COUNT WITH DIFFERENTIAL
Eos, Fluid: 4 %
Lymphs, Fluid: 6 %
Lymphs, Fluid: 1 %
Monocyte-Macrophage-Serous Fluid: 6 % — ABNORMAL LOW (ref 50–90)
Neutrophil Count, Fluid: 74 % — ABNORMAL HIGH (ref 0–25)
Neutrophil Count, Fluid: 99 % — ABNORMAL HIGH (ref 0–25)
Other Cells, Fluid: 10 %
Total Nucleated Cell Count, Fluid: UNDETERMINED cu mm (ref 0–1000)
Total Nucleated Cell Count, Fluid: 12206 cu mm — ABNORMAL HIGH (ref 0–1000)

## 2020-09-20 LAB — CBC
HCT: 29.4 % — ABNORMAL LOW (ref 39.0–52.0)
Hemoglobin: 9.8 g/dL — ABNORMAL LOW (ref 13.0–17.0)
MCH: 30.5 pg (ref 26.0–34.0)
MCHC: 33.3 g/dL (ref 30.0–36.0)
MCV: 91.6 fL (ref 80.0–100.0)
Platelets: 996 10*3/uL (ref 150–400)
RBC: 3.21 MIL/uL — ABNORMAL LOW (ref 4.22–5.81)
RDW: 15.2 % (ref 11.5–15.5)
WBC: 17.1 10*3/uL — ABNORMAL HIGH (ref 4.0–10.5)
nRBC: 0 % (ref 0.0–0.2)

## 2020-09-20 LAB — BASIC METABOLIC PANEL
Anion gap: 11 (ref 5–15)
BUN: 25 mg/dL — ABNORMAL HIGH (ref 6–20)
CO2: 26 mmol/L (ref 22–32)
Calcium: 9.1 mg/dL (ref 8.9–10.3)
Chloride: 98 mmol/L (ref 98–111)
Creatinine, Ser: 0.53 mg/dL — ABNORMAL LOW (ref 0.61–1.24)
GFR, Estimated: >60 mL/min (ref >60)
Glucose, Bld: 118 mg/dL — ABNORMAL HIGH (ref 70–99)
Potassium: 4.4 mmol/L (ref 3.5–5.1)
Sodium: 135 mmol/L (ref 135–145)

## 2020-09-20 LAB — GLUCOSE, CAPILLARY
Glucose-Capillary: 115 mg/dL — ABNORMAL HIGH (ref 70–99)
Glucose-Capillary: 115 mg/dL — ABNORMAL HIGH (ref 70–99)
Glucose-Capillary: 116 mg/dL — ABNORMAL HIGH (ref 70–99)
Glucose-Capillary: 132 mg/dL — ABNORMAL HIGH (ref 70–99)
Glucose-Capillary: 147 mg/dL — ABNORMAL HIGH (ref 70–99)
Glucose-Capillary: 156 mg/dL — ABNORMAL HIGH (ref 70–99)

## 2020-09-20 LAB — LACTATE DEHYDROGENASE, PLEURAL OR PERITONEAL FLUID: LD, Fluid: >10000 U/L — ABNORMAL HIGH (ref 3–23)

## 2020-09-20 LAB — MAGNESIUM: Magnesium: 2.3 mg/dL (ref 1.7–2.4)

## 2020-09-20 LAB — PROTEIN, PLEURAL OR PERITONEAL FLUID: Total protein, fluid: <3 g/dL

## 2020-09-20 LAB — PATHOLOGIST SMEAR REVIEW

## 2020-09-20 LAB — GLUCOSE, PLEURAL OR PERITONEAL FLUID: Glucose, Fluid: <20 mg/dL

## 2020-09-20 MED ORDER — ETOMIDATE 2 MG/ML IV SOLN
INTRAVENOUS | Status: AC
  Filled 2020-09-20: qty 10

## 2020-09-20 MED ORDER — TRACE MINERALS CU-MN-SE-ZN 300-55-60-3000 MCG/ML IV SOLN
INTRAVENOUS | Status: AC
  Filled 2020-09-20: qty 832

## 2020-09-20 MED ORDER — METRONIDAZOLE 500 MG/100ML IV SOLN
500.0000 mg | Freq: Three times a day (TID) | INTRAVENOUS | Status: DC
  Administered 2020-09-20 – 2020-09-23 (×11): 500 mg via INTRAVENOUS
  Filled 2020-09-20 (×9): qty 100

## 2020-09-20 MED ORDER — FENTANYL CITRATE (PF) 100 MCG/2ML IJ SOLN
100.0000 ug | Freq: Once | INTRAMUSCULAR | Status: AC
  Administered 2020-09-20: 100 ug via INTRAVENOUS
  Filled 2020-09-20: qty 2

## 2020-09-20 MED ORDER — SODIUM CHLORIDE 0.9% FLUSH
10.0000 mL | Freq: Three times a day (TID) | INTRAVENOUS | Status: DC
  Administered 2020-09-20 – 2020-10-02 (×27): 10 mL

## 2020-09-20 MED ORDER — MIDAZOLAM HCL 2 MG/2ML IJ SOLN
2.0000 mg | Freq: Once | INTRAMUSCULAR | Status: AC
  Administered 2020-09-20: 2 mg via INTRAVENOUS
  Filled 2020-09-20: qty 2

## 2020-09-20 MED ORDER — ETOMIDATE 2 MG/ML IV SOLN
20.0000 mg | Freq: Once | INTRAVENOUS | Status: AC
  Administered 2020-09-20: 20 mg via INTRAVENOUS

## 2020-09-20 MED ORDER — VITAL 1.5 CAL PO LIQD
1000.0000 mL | ORAL | Status: DC
  Administered 2020-09-20: 1000 mL

## 2020-09-20 NOTE — Progress Notes (Signed)
Trach sutured removed without complications. Pt was placed on trach collar 28%/5L per CCM, however pt could not tolerate at this time. Pt's RR went into 50s. Pt was placed back on ventilator and is stable at this time. MD aware.

## 2020-09-20 NOTE — Progress Notes (Addendum)
PHARMACY - TOTAL PARENTERAL NUTRITION CONSULT NOTE   Indication:  intolerance to enteral feeding , leaking J tube, s/p esophagectomy  Patient Measurements: Height: _0  (172.7 cm) Weight: 53.9 kg (118 lb 13.3 oz) IBW/kg (Calculated) : 68.4 TPN AdjBW (KG): 61 Body mass index is 18.07 kg/m. Usual Weight: 52 kg  Assessment:  59 yom with severe malnutrition and hx benign esophageal stricture with J-tube who is s/p planned robotic Ivor Lewis esophagectomy on 7/28. TF were held due to drainage around J-tube on 7/30, resumed at lower rate on 7/31. TF held again on 8/2 for continued drainage around J-tube. He was taken back to the OR on 8/3 for revision of anastomotic leak. J-tube replaced in IR on 8/4 but continues to have drainage. Significant refeeding risk noted. Pharmacy consulted for TPN.   Glucose / Insulin: no hx DM, CBGs 100-150 while on TPN, 88 while off TPN. No insulin in TPN. 4 units insulin / 24 hrs.  Electrolytes: Na 135. CL 98. CoCa 10.7. Mg 2.3. Other electrolytes wnl.  Renal: SCr <1. BUN 25.  - s/p furosemide 62m IV q8h x3 Hepatic: AST/ALT 47/50 - slight inc. Alk Phos 131 - slight inc. Tbili 1. Albumin 2. Prealbumin 9.5. TG 101.  Intake / Output; MIVF: UOP 1.5 ml/kg/hr. Chest tube 70 ml. RLQ JP drain output not recorded. RUQ JP drain output not recorded. LBM 8/14 - J tube leaking noted by MD 8/13--TF tried 8/13 then turned back off. - Tube feeds resumed at 20 ml/hr on 8/15 - no leaking per RN, continued abdominal pain prior to tube feeds resuming per RN GI Meds: Docusate BID, Relgan 162mIV q6hr, PPI IV/24h, Senna BID, Miralax BID  GI Imaging: 8/3 CT abd/pelvis: small gas-filled collection on the left side of the mediastinum probably represents a small pocket at the esophageal-gastric anastomosis, large amount of pneumoperitoneum 8/10 Jtube positioning ok 8/12 abd x-ray: normal bowel gas pattern 8/15 CT abd: Large fluid collection in the posteromedial left hemithorax  GI  Surgeries / Procedures:  8/4 J-tube replaced in IR  Central access: double lumen CVC 09/01/20, triple lumen PICC 8/5 TPN start date: 09/09/20  Nutritional Goals (per RD recommendation on 8/12): kCal: 1950-2250 kcals Protein:  110-125 g, Fluid: >= 2 L Goal TPN rate is 95 mL/hr (provides 112 g of protein and 2216 kcals per day)  Current Nutrition:  TPN Vital High Protein at 20 ml/hr  Plan:  Continue concentrate cyclic TPN over 18 hrs - discussed with Dr. SmTamala Juliannd plan to place tube to offload J tube and will continue full TPN with slow advancement of tube feeds over next couple days This TPN provides 125 g of protein, 268.8 g of dextrose (max GIR 5.12), and 63.4 g of lipids (31%)  for a total of 2047 kcals meeting 100% of patient needs per goals set 8/12. Electrolytes in TPN (no changes today): Continue 80 mEq/L Na, 65 mEq/L K, 18 mmol/L Phos, 5 mEq/L Mg, 0 mEq/L Ca. Max Cl.  Add standard MVI and trace elements to TPN Continue TCTS SSI q4h SSI and adjust as needed  Monitor CBGs closely off TPN for hypoglycemia and need to increase TPN cyclic time Monitor TPN labs on Mon/Thurs. Check BMP tomorrow to assess Na trend Follow up advancement of tube feeds and ability to wean TPN   GrCristela FeltPharmD, BCPS Clinical Pharmacist 09/20/2020 8:00 AM

## 2020-09-20 NOTE — Plan of Care (Signed)

## 2020-09-20 NOTE — Progress Notes (Signed)
Nutrition Follow-up  DOCUMENTATION CODES:   Underweight, Severe malnutrition in context of chronic illness  INTERVENTION:   Continue cyclic TPN to meet 100% estimated needs -Recommend continuing TPN to meet 100% of needs until pt demonstrating ability to titrate TF towards goal without increased leakage  Tube Feeding:  Change to Vital 1.5 and increase to 30 ml/hr; goal rate of 60 ml/hr with Pro-Source TF 45 mL BID   NUTRITION DIAGNOSIS:   Severe Malnutrition related to chronic illness (recurrent esophageal stricture s/p J-tube) as evidenced by severe muscle depletion, severe fat depletion.  Being addressed via TF   GOAL:   Patient will meet greater than or equal to 90% of their needs  Progressing  MONITOR:   Vent status, Labs, Weight trends, Skin, I & O's, Other (Comment) (TPN)  REASON FOR ASSESSMENT:   Consult Enteral/tube feeding initiation and management  ASSESSMENT:   55 year old male who presented on 7/28 for esophagogastroduodenoscopy and robotic assisted esophagectomy. PMH of esophageal stricture s/p multiple dilations and J-tube placement in April 2022, stroke, CAD, MI, anxiety.  Pt remains on vent support Chest tube placed today with 1.7 L out thus far  Cyclic TPN continues  Tolerating Vital High Protein at 20 ml/hr with minimal leakage per RN  +BM  Labs: reviewed Meds: colace, reglan, miralax   Diet Order:   Diet Order             Diet NPO time specified  Diet effective midnight                   EDUCATION NEEDS:   No education needs have been identified at this time  Skin:  Skin Assessment: Skin Integrity Issues: Skin Integrity Issues:: Stage II DTI: n/a Stage II: sacrum Incisions: abdoment, R chest x 2  Last BM:  8/16  Height:   Ht Readings from Last 1 Encounters:  09/07/20 5\' 8"  (1.727 m)    Weight:   Wt Readings from Last 1 Encounters:  09/20/20 53.9 kg   BMI:  Body mass index is 18.07 kg/m.  Estimated  Nutritional Needs:   Kcal:  1950-2250 kcals  Protein:  110-125 g  Fluid:  >/= 2.0 L   09/22/20 MS, RDN, LDN, CNSC Registered Dietitian III Clinical Nutrition RD Pager and On-Call Pager Number Located in [place]

## 2020-09-20 NOTE — Progress Notes (Signed)
Patient ID: Ralph [name], male   DOB: 1965-06-13, 55 y.o.   MRN: [id number] TCTS Evening Rounds:  Hemodynamically stable  Temp 100.9 this afternoon.  Pigtail placed in left pleural space this am and drained 1000 cc of purulent fluid initially and another 250 cc today. GS shows abundant G-rods.  Urine output ok  Remains on vent.

## 2020-09-20 NOTE — Progress Notes (Signed)
Physical Therapy Treatment Patient Details Name: Ralph [name] MRN: [id number] DOB: Sep 15, 1965 Today's Date: 09/20/2020    History of Present Illness 55 y/o male admitted 7/28 for an Ivor-Lewis esophagectomy due to benign lower esophageal stricture and protein malnutrition.   8/3 pt returned to OR for esophageal rupture with exploration and repair with Jtube placement.  8/9 Tracheostomy.  PMHx: anxiety, CAD, MI, Stroke, multiple balloon dilations to esophageal stricture.    PT Comments    Continuing work on functional mobility and activity tolerance;  able to sit EOB today, even when appearing uncomfortable; 2 person Max assist for standing trials, continues to require bil knees blocked for stability   Follow Up Recommendations  LTACH;Supervision/Assistance - 24 hour     Equipment Recommendations  Rolling walker with 5" wheels;Hospital bed    Recommendations for Other Services       Precautions / Restrictions Precautions Precautions: Fall;Other (comment) Precaution Comments: J tube, NG tube, bil chest tubes, trach, 2 jp drains    Mobility  Bed Mobility Overal bed mobility: Needs Assistance Bed Mobility: Supine to Sit;Sit to Supine;Rolling Rolling: Min guard Sidelying to sit: Max assist;+2 for physical assistance   Sit to supine: +2 for safety/equipment;Min assist;HOB elevated   General bed mobility comments: multimodal cueing for technique; ultimately Max assist to move sidelying to sit    Transfers Overall transfer level: Needs assistance Equipment used: 2 person hand held assist (and bed pad to cradle hips) Transfers: Sit to/from Stand Sit to Stand: Max assist;+2 safety/equipment         General transfer comment: physical assist with bil knee block and use of pad to stand with pt maintaining hip flexion and left knee flexion with multimodal cues for upright posture grossly 20 sec. Pt denied repeated attempts  Ambulation/Gait                 Stairs              Wheelchair Mobility    Modified Rankin (Stroke Patients Only)       Balance     Sitting balance-Leahy Scale: Fair Sitting balance - Comments: tending to L lean, including into elbow prop Postural control: Left lateral lean   Standing balance-Leahy Scale: Zero Standing balance comment: bil UE and knee block with assist                            Cognition Arousal/Alertness: Awake/alert Behavior During Therapy: Flat affect;Anxious Overall Cognitive Status: Difficult to assess                                 General Comments: Slightly anxious with mobility and lines needing reinforcement to be aware of lines and not pull      Exercises      General Comments General comments (skin integrity, edema, etc.): BP supine 112/84, HR 115; BP sitting 103/80, HR 122; BP back supine 125/92, 115; vent, full suport, 28% FiO2      Pertinent Vitals/Pain Pain Assessment: Faces Faces Pain Scale: Hurts even more Pain Location: back pain with sitting Pain Descriptors / Indicators: Grimacing;Guarding Pain Intervention(s): Monitored during session    Home Living                      Prior Function            PT Goals (current goals  can now be found in the care plan section) Acute Rehab PT Goals Patient Stated Goal: difficult to discern PT Goal Formulation: Patient unable to participate in goal setting Time For Goal Achievement: 09/30/20 Potential to Achieve Goals: Fair Progress towards PT goals: Progressing toward goals (slowly)    Frequency    Min 3X/week      PT Plan Current plan remains appropriate    Co-evaluation              AM-PAC PT "6 Clicks" Mobility   Outcome Measure  Help needed turning from your back to your side while in a flat bed without using bedrails?: A Lot Help needed moving from lying on your back to sitting on the side of a flat bed without using bedrails?: Total Help needed moving to and from a  bed to a chair (including a wheelchair)?: Total Help needed standing up from a chair using your arms (e.g., wheelchair or bedside chair)?: Total Help needed to walk in hospital room?: Total Help needed climbing 3-5 steps with a railing? : Total 6 Click Score: 7    End of Session Equipment Utilized During Treatment: Other (comment) (vent) Activity Tolerance: Patient limited by fatigue Patient left: in bed;with call bell/phone within reach;with bed alarm set Nurse Communication: Mobility status;Need for lift equipment PT Visit Diagnosis: Other abnormalities of gait and mobility (R26.89);Muscle weakness (generalized) (M62.81);Difficulty in walking, not elsewhere classified (R26.2) Pain - part of body:  (abdomen/flank)     Time: 7782-4235 PT Time Calculation (min) (ACUTE ONLY): 16 min  Charges:  $Therapeutic Activity: 8-22 mins                     Van Clines, PT  Acute Rehabilitation Services Pager [phone] Office [phone]    Ralph [name] 09/20/2020, 8:03 PM

## 2020-09-20 NOTE — Progress Notes (Signed)
NAME:  Ralph [name], MRN:  [id number], DOB:  24-Apr-1965, LOS: 10 ADMISSION DATE:  09/01/2020, CONSULTATION DATE:  8/3 REFERRING MD:  Dr. Kipp Brood, CHIEF COMPLAINT:  esophageal rupture, esophageal stricture    History of Present Illness:  Patient is a 55 yo M w/ PMH anxiety, CAD, MI, stroke presents to [facility] on 7/28 for esophagectomy w/ Dr. Kipp Brood for esophageal stricture.  On 7/28, Dr. Kipp Brood performed the esophagectomy without complication.  8/3 patient taken back to OR for esophageal rupture s/p esophagectomy by Dr. Kipp Brood. Unable to extubate post procedure. Patient remains intubated on mechanical ventilation. ABG with respiratory acidosis.    PCCM consulted for vent management. He remains critically ill  Pertinent  Medical History   Past Medical History:  Diagnosis Date   Anxiety    Coronary artery disease    Myocardial infarction [facility])    Stroke ([facility])     Significant Hospital Events: Including procedures, antibiotic start and stop dates in addition to other pertinent events   7/28 admitted for planned esophagectomy. Chest tube was placed on right chest. J tube in place. 2 JP drains in place. Patient was extubated post procedure. Patient developed subcutaneous emphysema. Unable to use J tube due to leakage around site. 8/3: PCCM consulted for vent management after pt brought back to OR for esophageal rupture/ leak. Vanc/zosyn/fluconazole started. Hypotension noted post-op. Felt 2/2 Sepsis; treated w/ volume and pressors.  8/4: Still on norepinephrine.  Central venous pressure suggesting he is still hypovolemic so receiving fluid.  Spontaneous breathing trial initiated but not ready for extubation due to work of breathing.  culture sent from chest tube.  J-tube replaced in interventional radiology due to leaking.  Tube feeds resumed in the afternoon 8/5: J-tube site again leaking, tube feeds placed on hold around midnight.Still pressor dependent . Passed SBT. WOB improved.   Failed extubation and was reintubated 8/8: J-tube upsized 8/9: Tracheostomy 8/10 J-tube continues to leak. TF stopped. RT IJ CVC removed. 8/13 starting trickle feeds 8/15 bronch 8/16 pigtail to L fluid collection  Antibiotics: Cefoxitin 7/28-7/29 Cefazolin 7/29 Fluconazole 8/3>> Gentamicin 8/3 Zosyn 8/3>>8/10 Vancomycin 8/3-8/7 Ceftazidime 8/10>> Tobramycin nebs 8/10>>  Interim History / Subjective:  No events. Mild abd pain. Remains tachypneic on full support or pressure support.  Objective   Blood pressure 117/86, pulse (!) 114, temperature 98.8 F (37.1 C), temperature source Oral, resp. rate (!) 25, height _0  (1.727 m), weight 53.9 kg, SpO2 96 %.    Vent Mode: PRVC FiO2 (%):  [30 %-40 %] 30 % Set Rate:  [15 bmp] 15 bmp Vt Set:  [540 mL] 540 mL PEEP:  [5 cmH20] 5 cmH20 Plateau Pressure:  [22 cmH20-29 cmH20] 23 cmH20   Intake/Output Summary (Last 24 hours) at 09/20/2020 1024 Last data filed at 09/20/2020 1000 Gross per 24 hour  Intake 1860.35 ml  Output 3380 ml  Net -1519.65 ml    Filed Weights   09/18/20 0500 09/19/20 0600 09/20/20 0500  Weight: 55 kg 54.8 kg 53.9 kg    Examination: General: thin, frail, ill appearing man lying in bed HENT: trach in place, small thick secretions Lungs: coarse breath sounds bilaterally, tachypneic, large posterior fluid collection with [place], R chest tube in place with serosanguinous ouptut Cardiovascular: RRR, no M/R/G Abdomen: soft, NT, non distended, J tube in place with TF ongoing, mild leakage Extremities: muscle wasting, trace edema Neuro: awake, following commands, moving all extremities spontaneously with purpose GU: condom cath in place  BMP stable Plts/WBC up  a bit  Resolved Hospital Problem list   Bradycardia Constipation   Assessment & Plan:  Acute respiratory failure w/ hypoxia and hypercarbia requiring mechanical ventilation.  S/p tracheostomy (8/9) HAP- Pan-sensitive Pseudomonas pneumonia L loculated  effusion- with increasing thrombocythemia  - 14 days ceftazidine and inhaled tobra, f/u BAL - Pigtail to left chest to facilitate vent weaning efforts, send for usual studies - Work toward [employer] - Pain and sedation regimen as ordered  Esophageal stricture s/p esophagectomy complicated by post operative esophageal rupture and J-tube leakage Constipation- resolved Severe protein calorie malnutrition, predates surgery   Leaking J tube- intermittent issue throughout stay, CT a/p looks okay position.  J-tube upsized 8/8. Tube feeding stopped again on 8/11 due to leak around J tube, yellow/green drainage overnight per nursing. 8/13 restarted trickle tube feeds, but stopped due to pain. Pain did not resolve after TF cessation and TF restarted 8/14.  - Continue reglan - Continue miralax, sennosides, and docusate  - Continue TPN - Slowly trickle up TF, if does not tolerate need to brainstorm new enteral feeding plan   Pressure Wound - Sacrum -Continue q2h turn -Continue foam dressing  Best Practice (right click and "Reselect all SmartList Selections" daily)   Diet/type: tubefeeds and TPN DVT prophylaxis: LMWH GI prophylaxis: PPI Lines: NG, J tube, PICC Foley:  Condom cath Code Status:  full code Last date of multidisciplinary goals of care discussion: goal is LTACH   Patient critically ill due to respiratory failure Interventions to address this today vent titration Risk of deterioration without these interventions is high  I personally spent 33 minutes providing critical care not including any separately billable procedures  Erskine Emery MD [facility]  Prefer epic messenger for cross cover needs If after hours, please call E-link

## 2020-09-20 NOTE — Procedures (Signed)
Insertion of Chest Tube Procedure Note   Date:09/20/20  Time:8:49 AM    Provider Performing: Norton Blizzard   Procedure: Pleural Catheter Insertion w/o Imaging Guidance (82707)  Indication(s) Effusion  Consent Risks of the procedure as well as the alternatives and risks of each were explained to the patient and/or caregiver.  Consent for the procedure was obtained and is signed in the bedside chart  Anesthesia Topical with 1% lidocaine etomidate 10 mg IV Versed 2mg  IV  Fentanyl 100 mg IV x2   Time Out Verified patient identification, verified procedure, site/side was marked, verified correct patient position, special equipment/implants available, medications/allergies/relevant history reviewed, required imaging and test results available.   Sterile Technique Maximal sterile technique including full sterile barrier drape, hand hygiene, sterile gown, sterile gloves, mask, hair covering, sterile ultrasound probe cover (if used).   Procedure Description Ultrasound used to identify appropriate pleural anatomy for placement and overlying skin marked. Area of placement cleaned and draped in sterile fashion.  A 14.0 French pigtail pleural catheter was placed into the left pleural space using Seldinger technique. Appropriate return of fluid was obtained.  The tube was connected to atrium and placed on -20 cm H2O wall suction.   Complications/Tolerance None; patient tolerated the procedure well. Chest X-ray is ordered to verify placement.   EBL Minimal  Specimen(s) fluid - fluid studies ordered

## 2020-09-20 NOTE — Progress Notes (Signed)
[address]       [place] [address]             [phone]                 13 Days Post-Op Procedure(s) (LRB): XI ROBOTIC ASSISTED THORASCOPY-DECORTICATION & REVISION OF ESOPHAGECTOMY (Right) ESOPHAGOGASTRODUODENOSCOPY (EGD) (N/A) INTERCOSTAL NERVE BLOCK (Right)   Events: Chest tube placed with over a liter return of purulent fluid.  _______________________________________________________________ Vitals: BP 107/82   Pulse (!) 123   Temp 98.2 F (36.8 C) (Oral)   Resp (!) 26   Ht 5\' 8"  (1.727 m)   Wt 53.9 kg   SpO2 98%   BMI 18.07 kg/m   - Neuro: awake  - Cardiovascular: Sinus  Drips:       - Pulm:  Vent Mode: PRVC FiO2 (%):  [28 %-50 %] 50 % Set Rate:  [15 bmp] 15 bmp Vt Set:  [540 mL] 540 mL PEEP:  [5 cmH20] 5 cmH20 Plateau Pressure:  [22 cmH20-29 cmH20] 23 cmH20  ABG    Component Value Date/Time   PHART 7.414 09/08/2020 0444   PCO2ART 32.8 09/08/2020 0444   PO2ART 98 09/08/2020 0444   HCO3 20.7 09/08/2020 0444   TCO2 22 09/08/2020 0444   ACIDBASEDEF 3.0 (H) 09/08/2020 0444   O2SAT 97.0 09/08/2020 0444    - Abd: Soft - Extremity: Cool  .Intake/Output      08/15 0701 08/16 0700 08/16 0701 08/17 0700   I.V. (mL/kg) 2329.3 (43.2)    Other     NG/GT 9    IV Piggyback 306.7    Total Intake(mL/kg) 2645 (49.1)    Urine (mL/kg/hr) 2350 (1.8) 600 (1.4)   Drains     Stool 0    Chest Tube 80 1000   Total Output 2430 1600   Net +215 -1600        Stool Occurrence 1 x       _______________________________________________________________ Labs: CBC Latest Ref Rng & Units 09/20/2020 09/19/2020 09/18/2020  WBC 4.0 - 10.5 K/uL 17.1(H) 12.4(H) 13.8(H)  Hemoglobin 13.0 - 17.0 g/dL 09/20/2020) 4.6(O) 0.3(O)  Hematocrit 39.0 - 52.0 % 29.4(L) 25.8(L) 26.0(L)  Platelets 150 - 400 K/uL 996(HH) 879(H) 884(H)   CMP Latest Ref Rng & Units 09/20/2020 09/19/2020 09/18/2020  Glucose 70 - 99 mg/dL 09/20/2020) 482(N) 003(B)  BUN 6 - 20 mg/dL 048(G)  89(V) 69(I)  Creatinine 0.61 - 1.24 mg/dL 50(T) 8.88(K) 8.00(L)  Sodium 135 - 145 mmol/L 135 137 139  Potassium 3.5 - 5.1 mmol/L 4.4 4.3 3.7  Chloride 98 - 111 mmol/L 98 103 99  CO2 22 - 32 mmol/L 26 27 30   Calcium 8.9 - 10.3 mg/dL 9.1 4.91(P) 8.9  Total Protein 6.5 - 8.1 g/dL - 5.9(L) 6.0(L)  Total Bilirubin 0.3 - 1.2 mg/dL - 1.0 1.2  Alkaline Phos 38 - 126 U/L - 131(H) 117  AST 15 - 41 U/L - 47(H) 35  ALT 0 - 44 U/L - 50(H) 43    CXR: -Improved aeration on the left.  _______________________________________________________________  Assessment and Plan: POD 18 s/p robotic Ivor Lewis esophagectomy, postop day 12 status post reexploration with reinforcement of anastomosis.  Neuro: Wean sedation as tolerated CV: Stable Pulm: Continue left chest tube to suction. Renal: Creatinine stable. GI:  On TPN.  J-tube feeds restarted today.  Heme: Stable ID: on fortaz and tobra.  Will await cultures from pleural drain. Endo: Sliding scale insulin Dispo: Continue  ICU care.   Corliss Skains 09/20/2020 3:08 PM

## 2020-09-20 NOTE — TOC Progression Note (Signed)
Transition of Care [facility]) - Progression Note    Patient Details  Name: Ralph [name] MRN: [id number] Date of Birth: [birth year]  Transition of Care [facility]) CM/SW Contact  Terrial Rhodes, LCSWA Phone Number: 09/20/2020, 11:47 AM  Clinical Narrative:     CSW left voicemail with Clydie Braun with [facility] to check status on referral for possible Vent/SNF placement for patient. CSW awaiting callback. CSW will continue to follow and assist with dc planning needs.  Expected Discharge Plan:  (Vent/SNF) Barriers to Discharge: Continued Medical Work up  Expected Discharge Plan and Services Expected Discharge Plan:  (Vent/SNF) In-house Referral: Clinical Social Work     Living arrangements for the past 2 months: Single Family Home                                       Social Determinants of Health (SDOH) Interventions    Readmission Risk Interventions No flowsheet data found.

## 2020-09-20 NOTE — Consult Note (Signed)
WOC Nurse wound follow up Wound type: Stage 2 Pressure Injury Measurement: 2.5cm x 2.0cm x0.1cm Wound bed: 95% pink/5% dark purple (skin peeling) Drainage (amount, consistency, odor) scant, yellow Periwound: intact Dressing procedure/placement/frequency: Continue silicone foam Low air loss mattress Turn and reposition frequently Nutritional support -TPN and tube feeding  WOC Nurse team will follow with you and see patient within 10 days for wound assessments.  Please notify WOC nurses of any acute changes in the wounds or any new areas of concern Dontrez Pettis [facility] MSN, RN,CWOCN, CNS, CWON-AP [phone]

## 2020-09-21 DIAGNOSIS — K9189 Other postprocedural complications and disorders of digestive system: Secondary | ICD-10-CM | POA: Diagnosis not present

## 2020-09-21 DIAGNOSIS — R001 Bradycardia, unspecified: Secondary | ICD-10-CM

## 2020-09-21 DIAGNOSIS — J9600 Acute respiratory failure, unspecified whether with hypoxia or hypercapnia: Secondary | ICD-10-CM

## 2020-09-21 DIAGNOSIS — Z9689 Presence of other specified functional implants: Secondary | ICD-10-CM | POA: Diagnosis not present

## 2020-09-21 DIAGNOSIS — K222 Esophageal obstruction: Secondary | ICD-10-CM | POA: Diagnosis not present

## 2020-09-21 LAB — CBC
HCT: 29.1 % — ABNORMAL LOW (ref 39.0–52.0)
Hemoglobin: 9.5 g/dL — ABNORMAL LOW (ref 13.0–17.0)
MCH: 30.1 pg (ref 26.0–34.0)
MCHC: 32.6 g/dL (ref 30.0–36.0)
MCV: 92.1 fL (ref 80.0–100.0)
Platelets: 807 10*3/uL — ABNORMAL HIGH (ref 150–400)
RBC: 3.16 MIL/uL — ABNORMAL LOW (ref 4.22–5.81)
RDW: 15.1 % (ref 11.5–15.5)
WBC: 18.1 10*3/uL — ABNORMAL HIGH (ref 4.0–10.5)
nRBC: 0 % (ref 0.0–0.2)

## 2020-09-21 LAB — BASIC METABOLIC PANEL
Anion gap: 5 (ref 5–15)
BUN: 26 mg/dL — ABNORMAL HIGH (ref 6–20)
CO2: 29 mmol/L (ref 22–32)
Calcium: 8.9 mg/dL (ref 8.9–10.3)
Chloride: 103 mmol/L (ref 98–111)
Creatinine, Ser: 0.46 mg/dL — ABNORMAL LOW (ref 0.61–1.24)
GFR, Estimated: >60 mL/min (ref >60)
Glucose, Bld: 116 mg/dL — ABNORMAL HIGH (ref 70–99)
Potassium: 4.3 mmol/L (ref 3.5–5.1)
Sodium: 137 mmol/L (ref 135–145)

## 2020-09-21 LAB — MAGNESIUM: Magnesium: 2.1 mg/dL (ref 1.7–2.4)

## 2020-09-21 LAB — TRIGLYCERIDES, BODY FLUIDS: Triglycerides, Fluid: 44 mg/dL

## 2020-09-21 LAB — CULTURE, RESPIRATORY W GRAM STAIN: Culture: NO GROWTH

## 2020-09-21 LAB — GLUCOSE, CAPILLARY
Glucose-Capillary: 100 mg/dL — ABNORMAL HIGH (ref 70–99)
Glucose-Capillary: 102 mg/dL — ABNORMAL HIGH (ref 70–99)
Glucose-Capillary: 104 mg/dL — ABNORMAL HIGH (ref 70–99)
Glucose-Capillary: 132 mg/dL — ABNORMAL HIGH (ref 70–99)
Glucose-Capillary: 136 mg/dL — ABNORMAL HIGH (ref 70–99)
Glucose-Capillary: 142 mg/dL — ABNORMAL HIGH (ref 70–99)

## 2020-09-21 LAB — PATHOLOGIST SMEAR REVIEW

## 2020-09-21 MED ORDER — DOBUTAMINE IN D5W 4-5 MG/ML-% IV SOLN
5.0000 ug/kg/min | INTRAVENOUS | Status: DC
  Administered 2020-09-21: 5 ug/kg/min via INTRAVENOUS
  Filled 2020-09-21: qty 250

## 2020-09-21 MED ORDER — ATROPINE SULFATE 1 MG/10ML IJ SOSY
0.5000 mg | PREFILLED_SYRINGE | INTRAMUSCULAR | Status: DC | PRN
  Administered 2020-09-22 (×2): 0.5 mg via INTRAVENOUS
  Administered 2020-09-26: 1 mg via INTRAVENOUS
  Filled 2020-09-21 (×3): qty 10

## 2020-09-21 MED ORDER — VITAL 1.5 CAL PO LIQD
1000.0000 mL | ORAL | Status: DC
  Administered 2020-09-21: 1000 mL

## 2020-09-21 MED ORDER — ATROPINE SULFATE 1 MG/10ML IJ SOSY
PREFILLED_SYRINGE | INTRAMUSCULAR | Status: AC
  Administered 2020-09-22: 0.5 mg via INTRAVENOUS
  Filled 2020-09-21: qty 10

## 2020-09-21 MED ORDER — TRACE MINERALS CU-MN-SE-ZN 300-55-60-3000 MCG/ML IV SOLN
INTRAVENOUS | Status: AC
  Filled 2020-09-21: qty 832

## 2020-09-21 NOTE — Consult Note (Signed)
Cardiology Consultation:   Patient ID: GEAN LAROSE; [id number]; [birth year]   Admit date: 09/01/2020 Date of Consult: 09/21/2020  Primary Care Provider: Pcp, No Primary Cardiologist: New to [facility]   Patient Profile:   RILEE WENDLING is a 55 y.o. male with a hx of critical esophageal stricture who recently underwent esophagectomy per Dr. Kipp Brood with postoperative esophageal rupture.  Also history of anxiety, CAD with prior MI (pt reports approximately 4 years ago and no stent placement), ongoing tobacco use, and prior CVA. Esophagectomy initially performed 09/01/2020 with postoperative complication and returned to the OR on 09/07/2020. Cardiology is being asked to follow for the evaluation of bradycardia at the request of Dr. Kipp Brood.    History of Present Illness:   Mr. Cihlar is a 55 year old male with a history stated above who presented initially on 09/01/2020 after being seen in outpatient setting for critical esophageal stricture for which he had previous J-tube placement (05/31/20) due to malnutrition and difficulty with solid foods. Prior to surgical intervention, he underwent pulmonary function testing along with cardiac stress testing both of which were negative. Procedure performed 6/94/8546 complicated by persistent bilious output from the site. He developed post operative emphysema with chest tube placement. Radiology consulted for repeat esophagram under fluoroscopy which showed no evidence of leak, was in adequate position and functional. He continued to have copious drainage and was therefore taken back to the OR for endoscopy and reexploration with concern for anastomotic leak on 09/07/2020 with subsequent reinforcement of esophageal anastomosis. Patient was unable to be extubated therefore critical care medicine was consulted. He continued to have issues with hypotension felt to be secondary to sepsis. He was passed SBT and was extubated 09/09/20 however ultimately failed and was quickly re  intubated. Tracheostomy was placed 09/13/20. He developed hospital-acquired pneumonia positive for Pseudomonas therefore antibiotics were titrated. He was placed on midodrine due to persistent hypotension. He underwent a bronchoscopy with lavage 09/19/2020 due to mucous plugging. Loculated effusion no with chest tube placement. The patient has had episodes of bradycardia during his hospitalization. On telemetry review, it appears episodes are consistent with Mobitz Type II AV block. HPI somewhat difficult to obtain due to trach placement. He is able to nod to answer question. Also spoke with his nurse who feels that episodes correlate with tracheal suctioning and the patient remains asymptomatic. Dobutamine initiated for heart rate stabilization with cardiology consultation.   Past Medical History:  Diagnosis Date   Anxiety    Coronary artery disease    Myocardial infarction [facility])    Stroke [facility])     Past Surgical History:  Procedure Laterality Date   APPENDECTOMY     BALLOON DILATION N/A 06/01/2019   Procedure: BALLOON DILATION;  Surgeon: Clarene Essex, MD;  Location: [facility];  Service: Endoscopy;  Laterality: N/A;   BALLOON DILATION N/A 07/16/2019   Procedure: BALLOON DILATION;  Surgeon: Clarene Essex, MD;  [facility]: [facility];  Service: Endoscopy;  Laterality: N/A;   BALLOON DILATION N/A 09/09/2019   Procedure: BALLOON DILATION;  Surgeon: Clarene Essex, MD;  [facility]: [facility];  Service: Endoscopy;  Laterality: N/A;   BIOPSY  04/29/2019   Procedure: BIOPSY;  Surgeon: Wilford Corner, MD;  Location: [place];  Service: Endoscopy;;   BIOPSY  05/27/2019   Procedure: BIOPSY;  Surgeon: Arta Silence, MD;  [facility]: [facility];  Service: Endoscopy;;   BIOPSY  06/01/2019   Procedure: BIOPSY;  Surgeon: Clarene Essex, MD;  [facility];  Service: Endoscopy;;  BIOPSY  07/16/2019   Procedure: BIOPSY;  Surgeon: Clarene Essex, MD;  [facility];  Service: Endoscopy;;   BIOPSY   05/27/2020   Procedure: BIOPSY;  Surgeon: Mauri Pole, MD;  Location: [facility] ENDOSCOPY;  Service: Endoscopy;;   ESOPHAGOGASTRODUODENOSCOPY N/A 06/01/2019   Procedure: ESOPHAGOGASTRODUODENOSCOPY (EGD);  Surgeon: Clarene Essex, MD;  Location: [facility];  Service: Endoscopy;  Laterality: N/A;   ESOPHAGOGASTRODUODENOSCOPY N/A 05/31/2020   Procedure: ESOPHAGOGASTRODUODENOSCOPY (EGD);  Surgeon: Lajuana Matte, MD;  Location: [facility] OR;  Service: Thoracic;  Laterality: N/A;   ESOPHAGOGASTRODUODENOSCOPY N/A 09/01/2020   Procedure: ESOPHAGOGASTRODUODENOSCOPY (EGD);  Surgeon: Lajuana Matte, MD;  Location: [facility] OR;  Service: Thoracic;  Laterality: N/A;   ESOPHAGOGASTRODUODENOSCOPY N/A 09/07/2020   Procedure: ESOPHAGOGASTRODUODENOSCOPY (EGD);  Surgeon: Lajuana Matte, MD;  Location: [place];  Service: Thoracic;  Laterality: N/A;   ESOPHAGOGASTRODUODENOSCOPY (EGD) WITH PROPOFOL N/A 04/29/2019   Procedure: ESOPHAGOGASTRODUODENOSCOPY (EGD) WITH PROPOFOL;  Surgeon: Wilford Corner, MD;  Location: [place];  Service: Endoscopy;  Laterality: N/A;   ESOPHAGOGASTRODUODENOSCOPY (EGD) WITH PROPOFOL N/A 05/27/2019   Procedure: ESOPHAGOGASTRODUODENOSCOPY (EGD) WITH PROPOFOL;  Surgeon: Arta Silence, MD;  [facility];  Service: Endoscopy;  Laterality: N/A;   ESOPHAGOGASTRODUODENOSCOPY (EGD) WITH PROPOFOL N/A 07/16/2019   Procedure: ESOPHAGOGASTRODUODENOSCOPY (EGD) WITH PROPOFOL;  Surgeon: Clarene Essex, MD;  [facility];  Service: Endoscopy;  Laterality: N/A;   ESOPHAGOGASTRODUODENOSCOPY (EGD) WITH PROPOFOL N/A 09/09/2019   Procedure: ESOPHAGOGASTRODUODENOSCOPY (EGD) WITH PROPOFOL;  Surgeon: Clarene Essex, MD;  [facility];  Service: Endoscopy;  Laterality: N/A;   ESOPHAGOGASTRODUODENOSCOPY (EGD) WITH PROPOFOL N/A 05/27/2020   Procedure: ESOPHAGOGASTRODUODENOSCOPY (EGD) WITH PROPOFOL;  Surgeon: Mauri Pole, MD;  Location: [place] ENDOSCOPY;  Service: Endoscopy;  Laterality: N/A;    FINE NEEDLE ASPIRATION  09/09/2019   Procedure: FINE NEEDLE ASPIRATION (FNA) LINEAR;  Surgeon: Clarene Essex, MD;  [facility];  Service: Endoscopy;;   FOREIGN BODY REMOVAL  07/16/2019   Procedure: FOREIGN BODY REMOVAL;  Surgeon: Clarene Essex, MD;  [facility];  Service: Endoscopy;;   FOREIGN BODY REMOVAL  05/27/2020   Procedure: FOREIGN BODY REMOVAL;  Surgeon: Mauri Pole, MD;  Location: [place];  Service: Endoscopy;;   INTERCOSTAL NERVE BLOCK Right 09/01/2020   Procedure: INTERCOSTAL NERVE BLOCK;  Surgeon: Lajuana Matte, MD;  Location: [place];  Service: Thoracic;  Laterality: Right;   INTERCOSTAL NERVE BLOCK Right 09/07/2020   Procedure: INTERCOSTAL NERVE BLOCK;  Surgeon: Lajuana Matte, MD;  Location: [place];  Service: Thoracic;  Laterality: Right;   IR REPLC DUODEN/JEJUNO TUBE PERCUT W/FLUORO  09/08/2020   IR [place] DUODEN/JEJUNO TUBE PERCUT W/FLUORO  09/12/2020   LAPAROSCOPIC JEJUNOSTOMY N/A 05/31/2020   Procedure: LAPAROSCOPIC JEJUNOSTOMY;  Surgeon: Lajuana Matte, MD;  Location: [place];  Service: Thoracic;  Laterality: N/A;  EGD, C-arm required.   SHOULDER SURGERY     UPPER ESOPHAGEAL ENDOSCOPIC ULTRASOUND (EUS) N/A 09/09/2019   Procedure: UPPER ESOPHAGEAL ENDOSCOPIC ULTRASOUND (EUS);  Surgeon: Arta Silence, MD;  Location: [facility];  Service: Endoscopy;  Laterality: N/A;     Prior to Admission medications   Medication Sig Start Date End Date Taking? Authorizing Provider  diclofenac Sodium (VOLTAREN) 1 % GEL Apply 2 g topically daily as needed (pain to left axillary region). 06/10/20  Yes Gifford Shave, MD  gabapentin (NEURONTIN) 250 MG/5ML solution Place 6 mLs (300 mg total) into feeding tube every 8 (eight) hours. 07/08/20  Yes Lightfoot, Lucile Crater, MD  lidocaine (LIDODERM) 5 % Place 1 patch  onto the skin daily. Remove & Discard patch within 12 hours or as directed by MD 06/14/20  Yes [name], Jamal Collin, MD  Nutritional Supplements (FEEDING  SUPPLEMENT, JEVITY 1.2 CAL,) LIQD Place 1,000 mLs into feeding tube continuous. 06/10/20  Yes Gifford Shave, MD  oxyCODONE (ROXICODONE) 5 MG/5ML solution Place 5 mLs (5 mg total) into feeding tube at bedtime as needed for severe pain. Patient taking differently: Place 5 mg into feeding tube 2 (two) times daily as needed for severe pain. 07/08/20  Yes Lightfoot, Lucile Crater, MD  polyvinyl alcohol (LIQUIFILM TEARS) 1.4 % ophthalmic solution Place 1 drop into both eyes as needed for dry eyes.   Yes [provider]  acetaminophen (TYLENOL) 160 MG/5ML suspension Place 20.3 mLs (650 mg total) into feeding tube every 6 (six) hours. Patient not taking: No sig reported 06/10/20   Gifford Shave, MD  mupirocin ointment (BACTROBAN) 2 % Apply 1 application topically 2 (two) times daily. Patient not taking: No sig reported 06/23/20   Zenia Resides, MD  pantoprazole sodium (PROTONIX) 40 mg/20 mL PACK Place 20 mLs (40 mg total) into feeding tube 2 (two) times daily. Patient not taking: No sig reported 06/10/20   Gifford Shave, MD  sennosides (SENOKOT) 8.8 MG/5ML syrup Place 5 mLs into feeding tube daily. Patient not taking: No sig reported 06/11/20   Gifford Shave, MD  sucralfate (CARAFATE) 1 GM/10ML suspension Place 10 mLs (1 g total) into feeding tube 4 (four) times daily -  with meals and at bedtime. Patient not taking: No sig reported 07/08/20   Lajuana Matte, MD    Inpatient Medications: Scheduled Meds:  atropine       chlorhexidine gluconate (MEDLINE KIT)  15 mL Mouth Rinse BID   Chlorhexidine Gluconate Cloth  6 each Topical Daily   clonazePAM  1 mg Per Tube BID   docusate  100 mg Per Tube BID   enoxaparin (LOVENOX) injection  30 mg Subcutaneous QHS   Gerhardt's butt cream   Topical QID   HYDROmorphone  4 mg Per Tube Q4H   insulin aspart  0-24 Units Subcutaneous Q4H   liver oil-zinc oxide   Topical BID   mouth rinse  15 mL Mouth Rinse 10 times per day   metoCLOPramide (REGLAN)  injection  10 mg Intravenous Q6H   midodrine  10 mg Per J Tube TID   pantoprazole (PROTONIX) IV  40 mg Intravenous Q24H   polyethylene glycol  17 g Per Tube BID   QUEtiapine  50 mg Per Tube BID   sennosides  10 mL Per Tube BID   sodium chloride flush  10 mL Intracatheter Q8H   sodium chloride flush  10-40 mL Intracatheter Q12H   tobramycin (PF)  300 mg Nebulization BID   Continuous Infusions:  sodium chloride 10 mL/hr at 09/21/20 1200   DOBUTamine 5 mcg/kg/min (09/21/20 1200)   feeding supplement (VITAL 1.5 CAL) 30 mL/hr at 09/21/20 0400   metronidazole Stopped (60/73/71 0626)   TPN CYCLIC-ADULT (ION)     PRN Meds: sodium chloride, acetaminophen (TYLENOL) oral liquid 160 mg/5 mL, fentaNYL (SUBLIMAZE) injection, levalbuterol, sodium chloride flush  Allergies:   No Known Allergies  Social History:   Social History   Socioeconomic History   Marital status: Single    Spouse name: Not on file   Number of children: Not on file   Years of education: Not on file   Highest education level: Not on file  Occupational History   Not on  file  Tobacco Use   Smoking status: Every Day    Packs/day: 1.00    Years: 20.00    Pack years: 20.00    Types: Cigarettes   Smokeless tobacco: Never  Vaping Use   Vaping Use: Former  Substance and Sexual Activity   Alcohol use: Yes    Alcohol/week: 2.0 standard drinks    Types: 1 Cans of beer, 1 Standard drinks or equivalent per week    Comment: 1-2 cans of beer per week   Drug use: Not Currently   Sexual activity: Yes    Comment: with wife  Other Topics Concern   Not on file  Social History Narrative   Not on file   Social Determinants of Health   Financial Resource Strain: Not on file  Food Insecurity: Not on file  Transportation Needs: Not on file  Physical Activity: Not on file  Stress: Not on file  Social Connections: Not on file  Intimate Partner Violence: Not on file    Family History:   Family History  Problem Relation Age  of Onset   Healthy Mother    Heart attack Father    Family Status:  Family Status  Relation Name Status   Mother  (Not Specified)   Father  Deceased    ROS:  Please see the history of present illness.  All other ROS reviewed and negative.     Physical Exam/Data:   Vitals:   09/21/20 1000 09/21/20 1058 09/21/20 1100 09/21/20 1200  BP: (!) 152/87 127/90 95/63 90/61   Pulse: (!) 42 100 86 82  Resp: 12 (!) 37 (!) 30 (!) 26  Temp:   (!) 97.2 F (36.2 C)   TempSrc:   Oral   SpO2: 100% 97% 100% 98%  Weight:      Height:        Intake/Output Summary (Last 24 hours) at 09/21/2020 1325 Last data filed at 09/21/2020 1200 Gross per 24 hour  Intake 3248.79 ml  Output 1692 ml  Net 1556.79 ml   Filed Weights   09/19/20 0600 09/20/20 0500 09/21/20 0413  Weight: 54.8 kg 53.9 kg 53.3 kg   Body mass index is 17.87 kg/m.   General: Ill appearing, NAD Neck: Negative for carotid bruits. No JVD Lungs: Bilateral rhonchi in lower lobes. No wheezes. On TC with vent support  Cardiovascular: RRR with S1 S2. No murmurs Abdomen: Soft, non-tender, non-distended. No obvious abdominal masses. Extremities: No edema. Neuro: UTA. No focal deficits. No facial asymmetry. MAE spontaneously. Psych: UTA   EKG:  The EKG was personally reviewed and demonstrates:  09/18/20 NSR with HR 91bpm  Telemetry:  Telemetry was personally reviewed and demonstrates: 09/21/20 NSR with intermittent episodes of Mobitz Type II AV block   Relevant CV Studies:  Stress test 07/20/20:  The left ventricular ejection fraction is normal (55-65%). Nuclear stress EF: 57%. There was no ST segment deviation noted during stress. No T wave inversion was noted during stress. The study is normal. This is a low risk study with no evidence of ischemia.  Echocardiogram 04/30/19:   1. Normal LV systolic function; grade 1 diastolic dysfunction.   2. Left ventricular ejection fraction, by estimation, is 60 to 65%. The  left  ventricle has normal function. The left ventricle has no regional  wall motion abnormalities. Left ventricular diastolic parameters are  consistent with Grade I diastolic  dysfunction (impaired relaxation).   3. Right ventricular systolic function is normal. The right ventricular  size is normal.  4. The mitral valve is normal in structure. No evidence of mitral valve  regurgitation. No evidence of mitral stenosis.   5. The aortic valve was not well visualized. Aortic valve regurgitation  is not visualized. No aortic stenosis is present.   6. The inferior vena cava is normal in size with greater than 50%  respiratory variability, suggesting right atrial pressure of 3 mmHg.   Laboratory Data:  Chemistry Recent Labs  Lab 09/19/20 0444 09/20/20 0356 09/21/20 0415  NA 137 135 137  K 4.3 4.4 4.3  CL 103 98 103  CO2 27 26 29   GLUCOSE 132* 118* 116*  BUN 26* 25* 26*  CREATININE 0.46* 0.53* 0.46*  CALCIUM 8.6* 9.1 8.9  GFRNONAA >60 >60 >60  ANIONGAP 7 11 5     Total Protein  Date Value Ref Range Status  09/19/2020 5.9 (L) 6.5 - 8.1 g/dL Final   Albumin  Date Value Ref Range Status  09/19/2020 2.0 (L) 3.5 - 5.0 g/dL Final   AST  Date Value Ref Range Status  09/19/2020 47 (H) 15 - 41 U/L Final   ALT  Date Value Ref Range Status  09/19/2020 50 (H) 0 - 44 U/L Final   Alkaline Phosphatase  Date Value Ref Range Status  09/19/2020 131 (H) 38 - 126 U/L Final   Total Bilirubin  Date Value Ref Range Status  09/19/2020 1.0 0.3 - 1.2 mg/dL Final   Hematology Recent Labs  Lab 09/19/20 0444 09/20/20 0356 09/21/20 0415  WBC 12.4* 17.1* 18.1*  RBC 2.75* 3.21* 3.16*  HGB 8.2* 9.8* 9.5*  HCT 25.8* 29.4* 29.1*  MCV 93.8 91.6 92.1  MCH 29.8 30.5 30.1  MCHC 31.8 33.3 32.6  RDW 15.6* 15.2 15.1  PLT 879* 996* 807*   Cardiac EnzymesNo results for input(s): TROPONINI in the last 168 hours. No results for input(s): TROPIPOC in the last 168 hours.  BNPNo results for input(s):  BNP, PROBNP in the last 168 hours.  DDimer No results for input(s): DDIMER in the last 168 hours. TSH:  Lab Results  Component Value Date   TSH 1.152 05/01/2019   Lipids: Lab Results  Component Value Date   CHOL 194 11/25/2012   HDL 50 11/25/2012   LDLCALC 125 (H) 11/25/2012   LDLDIRECT 67 10/23/2017   TRIG 101 09/19/2020   CHOLHDL 3.9 11/25/2012   HgbA1c: Lab Results  Component Value Date   HGBA1C 4.9 04/28/2019    Radiology/Studies:  [facility] Chest Port 1 View  Result Date: 09/20/2020 CLINICAL DATA:  Left-sided chest tube. EXAM: PORTABLE CHEST 1 VIEW COMPARISON:  CT chest and chest x-ray from yesterday. FINDINGS: Unchanged tracheostomy tube, right upper extremity PICC line, and right-sided chest tubes. New left-sided chest tube with interval decrease in size of the now small loculated pleural effusion. Improving aeration in the left lung. Unchanged small right pleural effusion and right lower lobe atelectasis. No pneumothorax. No acute osseous abnormality. IMPRESSION: 1. New left-sided chest tube with interval decrease in size of the now small loculated pleural effusion. Improving aeration of the left lung. No pneumothorax. Electronically Signed   By: Titus Dubin M.D.   On: 09/20/2020 10:52   DG Chest Port 1 View  Result Date: 09/19/2020 CLINICAL DATA:  Reason for exam: respiration abnormal Hx of MI. Hx of XI ROBOTIC ASSISTED THORASCOPY-DECORTICATION AND REVISION OF ESOPHAGECTOMY 09/07/2020. EXAM: PORTABLE CHEST - 1 VIEW COMPARISON:  the previous day's study FINDINGS: Multiple monitoring leads overlie the patient. Right multi sidehole chest tube and  PleurX type catheter remain in place. No pneumothorax. Tracheostomy stable. Right PICC stable. Persistent dense consolidation in the left lower lobe. Coarse subsegmental atelectasis/consolidation in the right mid lung slightly increased. Possible small effusions. Heart size and mediastinal contours are within normal limits. Visualized bones  unremarkable. IMPRESSION: 1. Stable right chest tubes, no pneumothorax. 2. Slight increase in right mid lung subsegmental atelectasis/consolidation. 3. Persistent dense left lower lobe atelectasis/consolidation. Electronically Signed   By: Lucrezia Europe M.D.   On: 09/19/2020 07:42   DG CHEST PORT 1 VIEW  Result Date: 09/18/2020 CLINICAL DATA:  History of esophagectomy EXAM: PORTABLE CHEST 1 VIEW COMPARISON:  September 16, 2020 chest x-ray FINDINGS: The tracheostomy tube is in good position. Right chest tubes are stable. A right PICC line is stable. No pneumothorax. Patchy opacity in the right base persists but has improved. Opacity in the left mid lower lung is stable. No other interval changes. IMPRESSION: 1. Support apparatus as above. 2. No pneumothorax. 3. Persistent but improving right basilar opacity. 4. Persistent opacity in the left lung, possibly layering effusion with underlying atelectasis versus infiltrate. No change. Electronically Signed   By: Dorise Bullion III M.D.   On: 09/18/2020 10:41   CT CHEST ABDOMEN PELVIS WO CONTRAST  Result Date: 09/19/2020 CLINICAL DATA:  Abdominal abscess/infection suspected Abdominal pain. Review of clinical notes demonstrates patient is 12 days post thoracoscopy with revision of esophagectomy. Patient underwent bronchoscopy earlier today. EXAM: CT CHEST, ABDOMEN AND PELVIS WITHOUT CONTRAST TECHNIQUE: Multidetector CT imaging of the chest, abdomen and pelvis was performed following the standard protocol without IV contrast. COMPARISON:  Multiple prior radiographs. CT chest abdomen pelvis 12 days ago FINDINGS: CT CHEST FINDINGS Cardiovascular: Aortic atherosclerosis. Coronary artery calcifications. Normal heart size. No pericardial effusion. Right upper extremity PICC tip in the lower SVC. Mediastinum/Nodes: There is a tracheostomy tube with tip at the thoracic inlet. Retained mucus is seen at and above the level of the tracheostomy. Postsurgical change from  esophagectomy. The upper esophagus is dilated. More distal neo esophagus is decompressed. There is slight leftward mediastinal mass effect from left lung fluid collection. Limited assessment for adenopathy, no bulky enlarged lymph nodes. Lungs/Pleura: 2 right-sided chest tubes in place, both tips extend into the pleural space posteriorly in the right hemithorax. Small residual right pleural effusion with linear and bandlike opacities throughout the right lower lobe, confluent at the lung base. Small amount of extrapleural air tracks anterior laterally in the right hemithorax. Fluid tracks into the inter lobar fissures. Minimal retained secretions or mucus in the right lower lobe bronchus. There is a large fluid collection in the posteromedial left hemithorax that measures approximately 13.2 x 11.8 x 20.4 cm (transverse by craniocaudal by AP dimension. Internal fluid is primary homogeneous, with a slight layering component dependently. There is likely peripheral rind, though this is not well assessed in the absence of IV contrast. This causes mass effect with near complete atelectasis of the left lower lobe, and partial atelectasis of the left upper lobe. There is mass effect on the left lower lobe bronchus. Background emphysema. Musculoskeletal: Previous subcutaneous emphysema has resolved. Suspected right lower lateral chest wall hematoma, series 3, image 44, not associated with current chest tubes. No focal osseous lesion. CT ABDOMEN PELVIS FINDINGS Hepatobiliary: Again seen gallbladder distension without gallstone or pericholecystic fat stranding. There is no evidence of focal hepatic lesion or abnormality. Pancreas: No ductal dilatation or inflammation. Spleen: Occasional calcified granulomas.  No splenomegaly. Adrenals/Urinary Tract: Slight left adrenal thickening. No dominant  adrenal nodule. There is no hydronephrosis. No perinephric edema or renal calculi. Unremarkable urinary bladder. Stomach/Bowel:  Postsurgical change of the gastroesophageal junction. Stomach is nondistended. Jejunostomy entering from the left mid abdomen, enteric contrast is seen throughout nondilated or inflamed small and large bowel. No obstruction. Vascular/Lymphatic: Age advanced aortic and branch atherosclerosis. There is no bulky abdominopelvic adenopathy. Reproductive: Prostate is unremarkable. Other: No free air or abdominopelvic fluid collection. Slight inversion left hemidiaphragm related to left lung fluid collection. Musculoskeletal: No acute findings. IMPRESSION: 1. Large fluid collection in the posteromedial left hemithorax measuring 13.2 x 11.8 x 20.4 cm. This causes mass effect on regional structures including the left lower lobe bronchus, with near complete atelectasis of the left lower lobe, and partial atelectasis of the left upper lobe. Despite large size this is relatively homogeneous and may represent a postoperative seroma or lymphocele, sterility is indeterminate by imaging. 2. Two right-sided chest tubes in place, both tips extend into the pleural space posteriorly in the right hemithorax. Small residual pleural fluid which tracks into the fissures. Small amount of extrapleural air tracks anterior laterally in the right hemithorax. 3. Esophagectomy. The neoesophagus is relatively decompressed, slightly patulous superiorly. 4. Tracheostomy tube in place. There is retained mucus above the level of the tracheostomy tube. Minimal mucus or debris involving the right lower lobe bronchus and distally. Linear and consolidative opacities in the dependent right lower lobe may be atelectasis or postobstructive. 5. Suspected small right lower lateral chest wall hematoma. 6. No acute abnormality in the abdomen/pelvis. Aortic Atherosclerosis (ICD10-I70.0) and Emphysema (ICD10-J43.9). Electronically Signed   By: Hannibal Rake M.D.   On: 09/19/2020 18:38    Assessment and Plan:   1.  Bradycardia/Mobitz Type II AV block: -Pt  noted to be intermittently bradycardic during his hospital course. Cardiology consulted after several episodes of Mobtiz AV block. Appears episodes mostly were occurring with tracheal suctioning and the patient has remained unaware of rhythm change. Dobutamine initiated per TCTS this AM. The patient continues to have episodes despite therapy. Would plan to stop dobutamine and follow on telemetry. Given his complex medical issues including acute infection and asymptomatic episodes, will plan conservative management at this time. If he becomes symptomatic, cousl consider short term dopamine. He is not a PPM candidate.   2.  Esophageal stricture s/p esophagectomy complicated by postoperative esophageal rupture and G-tube leakage: -Patient has had a prolonged hospitalization after presenting 09/01/2020 for salpingectomies secondary to severe esophageal stricture causing severe protein malnutrition.  Postoperative course complicated by persistent G-tube leaking with subsequent return to the OR for anastomosis reinforcement.  Tube feedings intermittently on and off. Management per primary team -Continue current regimen with Reglan, TPN and trickle tube feeds  3.  Acute on chronic respiratory failure/Pseudomonas pneumonia requiring prolonged mechanical ventilation now with tracheostomy 09/13/2020: -Patient with difficult course with postoperative extubation now with hospital-acquired Pseudomonas pneumonia and a left loculated effusion requiring chest tube placement with culture showing abundant GNR. Continue ABX therapy per primary team. Vent support per CCM  -Awaiting placement  Other hospital problems include:  -Pressure ulcer  For questions or updates, please contact [facility] Please consult [email or web address] for contact info under Cardiology/STEMI.   SignedKathyrn Drown NP-C [facility] Pager: [phone] 09/21/2020 1:25 PM

## 2020-09-21 NOTE — Progress Notes (Addendum)
[address]       [place] [address]             [phone]                 14 Days Post-Op Procedure(s) (LRB): XI ROBOTIC ASSISTED THORASCOPY-DECORTICATION & REVISION OF ESOPHAGECTOMY (Right) ESOPHAGOGASTRODUODENOSCOPY (EGD) (N/A) INTERCOSTAL NERVE BLOCK (Right)   Events: Episodes of brady this am.  asymptomatic  _______________________________________________________________ Vitals: BP 115/76   Pulse 91   Temp 98.4 F (36.9 C) (Oral)   Resp (!) 30   Ht 5\' 8"  (1.727 m)   Wt 53.3 kg   SpO2 99%   BMI 17.87 kg/m   - Neuro: awake  - Cardiovascular: Sinus brady  Drips:       - Pulm: drainage on the L clearing up.  Serous drainage on the right Vent Mode: PRVC FiO2 (%):  [28 %-50 %] 40 % Set Rate:  [15 bmp] 15 bmp Vt Set:  [540 mL] 540 mL PEEP:  [5 cmH20] 5 cmH20 Plateau Pressure:  [22 cmH20-29 cmH20] 25 cmH20  ABG    Component Value Date/Time   PHART 7.414 09/08/2020 0444   PCO2ART 32.8 09/08/2020 0444   PO2ART 98 09/08/2020 0444   HCO3 20.7 09/08/2020 0444   TCO2 22 09/08/2020 0444   ACIDBASEDEF 3.0 (H) 09/08/2020 0444   O2SAT 97.0 09/08/2020 0444    - Abd: Soft - Extremity: Cool  .Intake/Output      08/16 0701 08/17 0700 08/17 0701 08/18 0700   I.V. (mL/kg) 1246.8 (23.4)    NG/GT 413    IV Piggyback 500.2    Chest Tube 0    Total Intake(mL/kg) 2160 (40.5)    Urine (mL/kg/hr) 1650 (1.3)    Drains 5    Stool     Chest Tube 1312    Total Output 2967    Net -807            _______________________________________________________________ Labs: CBC Latest Ref Rng & Units 09/21/2020 09/20/2020 09/19/2020  WBC 4.0 - 10.5 K/uL 18.1(H) 17.1(H) 12.4(H)  Hemoglobin 13.0 - 17.0 g/dL 09/21/2020) 6.1(W) 4.3(X)  Hematocrit 39.0 - 52.0 % 29.1(L) 29.4(L) 25.8(L)  Platelets 150 - 400 K/uL 807(H) 996(HH) 879(H)   CMP Latest Ref Rng & Units 09/21/2020 09/20/2020 09/19/2020  Glucose 70 - 99 mg/dL 09/21/2020) 086(P) 619(J)  BUN 6 - 20 mg/dL 093(O)  67(T) 24(P)  Creatinine 0.61 - 1.24 mg/dL 80(D) 9.83(J) 8.25(K)  Sodium 135 - 145 mmol/L 137 135 137  Potassium 3.5 - 5.1 mmol/L 4.3 4.4 4.3  Chloride 98 - 111 mmol/L 103 98 103  CO2 22 - 32 mmol/L 29 26 27   Calcium 8.9 - 10.3 mg/dL 8.9 9.1 5.39(J)  Total Protein 6.5 - 8.1 g/dL - - 5.9(L)  Total Bilirubin 0.3 - 1.2 mg/dL - - 1.0  Alkaline Phos 38 - 126 U/L - - 131(H)  AST 15 - 41 U/L - - 47(H)  ALT 0 - 44 U/L - - 50(H)    CXR: -  _______________________________________________________________  Assessment and Plan: POD 19 s/p robotic Ivor Lewis esophagectomy, postop day 13 status post reexploration with reinforcement of anastomosis.  Neuro: Wean sedation as tolerated CV: brady episodes this am.  Will place dobutamine in line. Pulm: Continue left chest tube to suction. Renal: Creatinine stable. GI:  On TPN. Trickle J tube feeds  Heme: Stable ID: on fortaz and tobra.  GMN rods on pleural fluid cx Endo: Sliding  scale insulin Dispo: Continue ICU care.   Corliss Skains 09/21/2020 8:13 AM

## 2020-09-21 NOTE — Progress Notes (Signed)
PHARMACY - TOTAL PARENTERAL NUTRITION CONSULT NOTE   Indication:  intolerance to enteral feeding , leaking J tube, s/p esophagectomy  Patient Measurements: Height: 5' 8"  (172.7 cm) Weight: 53.3 kg (117 lb 8.1 oz) IBW/kg (Calculated) : 68.4 TPN AdjBW (KG): 61 Body mass index is 17.87 kg/m. Usual Weight: 52 kg  Assessment:  78 yom with severe malnutrition and hx benign esophageal stricture with J-tube who is s/p planned robotic Ivor Lewis esophagectomy on 7/28. TF were held due to drainage around J-tube on 7/30, resumed at lower rate on 7/31. TF held again on 8/2 for continued drainage around J-tube. He was taken back to the OR on 8/3 for revision of anastomotic leak. J-tube replaced in IR on 8/4 but continues to have drainage. Significant refeeding risk noted. Pharmacy consulted for TPN.   Glucose / Insulin: no hx DM, CBGs 100-150 while on TPN, 100 while off TPN. No insulin in TPN. 8 units insulin / 24 hrs.  Electrolytes: Na 137. Cl 103. CoCa 10.7. Mg 2.3. Other electrolytes wnl.  Renal: SCr <1. BUN 26.  Hepatic: AST/ALT 47/50 - slight inc. Alk Phos 131 - slight inc. Tbili 1. Albumin 2. Prealbumin 9.5. TG 101.  Intake / Output; MIVF: UOP 1.6 ml/kg/hr. L-CT 10 cc/24h, R-CT 1300 cc/24h. RLQ JP drain output not recorded. RUQ JP drain output not recorded. LBM 8/14 - J tube leaking noted by MD 8/13--TF tried 8/13 then turned back off. - Tube feeds resumed at 20 ml/hr on 8/15 - increased to 30 cc/hr on 8/16, no leaking per discussion with RN on 8/17 GI Meds: Docusate BID, Relgan 84m IV q6hr, PPI IV/24h, Senna BID, Miralax BID  GI Imaging: 8/3 CT abd/pelvis: small gas-filled collection on the left side of the mediastinum probably represents a small pocket at the esophageal-gastric anastomosis, large amount of pneumoperitoneum 8/10 Jtube positioning ok 8/12 abd x-ray: normal bowel gas pattern 8/15 CT abd: Large fluid collection in the posteromedial left hemithorax  GI Surgeries / Procedures:   8/4 J-tube replaced in IR  Central access: double lumen CVC 09/01/20, triple lumen PICC 8/5 TPN start date: 09/09/20  Nutritional Goals (per RD recommendation on 8/16): kCal: 1950-2250 kcals Protein:  110-125 g, Fluid: >= 2 L Goal TPN rate is 95 mL/hr (provides 112 g of protein and 2216 kcals per day)  Current Nutrition:  TPN Vital High Protein at 30 ml/hr (incr 8/16 afternoon, goal is 60 cc/hr) - RD recommends continuing TPN to meet 100% of needs until pt demonstrating ability to titrate TF towards goal without increased leakage  Plan:  - Continue concentrate cyclic TPN over 18 hrs - Plan per prior discussion with CCM is to place tube to offload J tube and will continue full TPN with slow advancement of tube feeds over next couple days  - This TPN provides 125 g of protein, 268.8 g of dextrose (max GIR 5.12), and 63.4 g of lipids (31%)  for a total of 2047 kcals meeting 100% of patient needs - Electrolytes in TPN (no changes today): Continue 80 mEq/L Na, 65 mEq/L K, 18 mmol/L Phos, 5 mEq/L Mg, 0 mEq/L Ca. Max Cl.  - Add standard MVI and trace elements to TPN - Continue TCTS SSI q4h SSI and adjust as needed  - Monitor CBGs closely off TPN for hypoglycemia and need to increase TPN cyclic time - Monitor TPN labs on Mon/Thurs. Check BMP tomorrow to assess Na trend - Follow up advancement of tube feeds and ability to wean TPN  Thank you for allowing pharmacy to be a part of this patient's care.  Alycia Rossetti, PharmD, BCPS Clinical Pharmacist Clinical phone for 09/21/2020: [phone] 09/21/2020 9:09 AM   **Pharmacist phone directory can now be found on [email or web address] ([email or web address]).  Listed under [place].

## 2020-09-21 NOTE — TOC Progression Note (Signed)
Transition of Care [facility]) - Progression Note    Patient Details  Name: WADE ASEBEDO MRN: [id number] Date of Birth: [birth year]  Transition of Care [facility]) CM/SW Contact  Terrial Rhodes, [place] Phone Number: 09/21/2020, 12:57 PM  Clinical Narrative:     CSW called Clydie Braun with [facility] to follow up on referral for Vent/SNF placement. CSW left voicemail and awaiting callback. CSW updated patients significant other [name]. CSW will continue to follow and assist with dc planning needs.  Expected Discharge Plan:  (Vent/SNF) Barriers to Discharge: Continued Medical Work up  Expected Discharge Plan and Services Expected Discharge Plan:  (Vent/SNF) In-house Referral: Clinical Social Work     Living arrangements for the past 2 months: Single Family Home                                       Social Determinants of Health (SDOH) Interventions    Readmission Risk Interventions No flowsheet data found.

## 2020-09-21 NOTE — Progress Notes (Addendum)
NAME:  Ralph [name], MRN:  [id number], DOB:  [birth year], LOS: 20 ADMISSION DATE:  09/01/2020, CONSULTATION DATE:  8/3 REFERRING MD:  Dr. Cliffton Asters, CHIEF COMPLAINT:  esophageal rupture, esophageal stricture    History of Present Illness:  Patient is a 55 yo M w/ PMH anxiety, CAD, MI, stroke presents to [facility] on 7/28 for esophagectomy w/ Dr. Cliffton Asters for esophageal stricture.  On 7/28, Dr. Cliffton Asters performed the esophagectomy without complication.  8/3 patient taken back to OR for esophageal rupture s/p esophagectomy by Dr. Cliffton Asters. Unable to extubate post procedure. Patient remains intubated on mechanical ventilation. ABG with respiratory acidosis.    PCCM consulted for vent management. He remains critically ill  Pertinent  Medical History   Past Medical History:  Diagnosis Date   Anxiety    Coronary artery disease    Myocardial infarction [facility])    Stroke (HCC)     Significant Hospital Events: Including procedures, antibiotic start and stop dates in addition to other pertinent events   7/28 admitted for planned esophagectomy. Chest tube was placed on right chest. J tube in place. 2 JP drains in place. Patient was extubated post procedure. Patient developed subcutaneous emphysema. Unable to use J tube due to leakage around site. 8/3: PCCM consulted for vent management after pt brought back to OR for esophageal rupture/ leak. Vanc/zosyn/fluconazole started. Hypotension noted post-op. Felt 2/2 Sepsis; treated w/ volume and pressors.  8/4: Still on norepinephrine.  Central venous pressure suggesting he is still hypovolemic so receiving fluid.  Spontaneous breathing trial initiated but not ready for extubation due to work of breathing.  culture sent from chest tube.  J-tube replaced in interventional radiology due to leaking.  Tube feeds resumed in the afternoon 8/5: J-tube site again leaking, tube feeds placed on hold around midnight.Still pressor dependent . Passed SBT. WOB improved.   Failed extubation and was reintubated 8/8: J-tube upsized 8/9: Tracheostomy 8/10 J-tube continues to leak. TF stopped. RT IJ CVC removed. 8/13 starting trickle feeds 8/15 bronch 8/16 pigtail to L fluid collection  Antibiotics: Cefoxitin 7/28-7/29 Fluconazole 8/3>> Gentamicin 8/3 Zosyn 8/3>8/10 Vancomycin 8/3-8/7 Ceftazidime 8/10>> Tobramycin nebs 8/10>> Flagyl 8/16 >>  Interim History / Subjective:  This AM with bradycardia (HR 28), started on dobutamine    Objective   Blood pressure 115/76, pulse 91, temperature 98.4 F (36.9 C), temperature source Oral, resp. rate (!) 30, height 5\' 8"  (1.727 m), weight 53.3 kg, SpO2 99 %.    Vent Mode: PRVC FiO2 (%):  [28 %-50 %] 40 % Set Rate:  [15 bmp] 15 bmp Vt Set:  [540 mL] 540 mL PEEP:  [5 cmH20] 5 cmH20 Plateau Pressure:  [22 cmH20-29 cmH20] 25 cmH20   Intake/Output Summary (Last 24 hours) at 09/21/2020 0919 Last data filed at 09/21/2020 09/23/2020 Gross per 24 hour  Intake 2160.02 ml  Output 1967 ml  Net 193.02 ml   Filed Weights   09/19/20 0600 09/20/20 0500 09/21/20 0413  Weight: 54.8 kg 53.9 kg 53.3 kg    Examination: General: thin, frail, ill appearing man lying in bed HENT: trach in place > clean  Lungs: coarse breath sounds, moderate use of accessory muscles, chest tube in place bilaterally  Cardiovascular: RRR, no M/R/G Abdomen: soft, NT, non distended, J tube in place with slight leakage noted around gauze  Extremities: muscle wasting, trace edema Neuro: alert, follows simple commands  GU: condom cath in place  Resolved Hospital Problem list   Constipation   Assessment & Plan:  Acute respiratory failure w/ hypoxia and hypercarbia requiring mechanical ventilation. s/p tracheostomy (8/9) HAP- Pan-sensitive Pseudomonas pneumonia L loculated effusion- with increasing thrombocythemia s/p Chest Tube (8/16), culture with abundant GNR Plan  - Continue inhaled Tobra for 14 days, Ceftazidime for 7 days (completing  today), Flagyl added 8/16 - Vent Support with TC as tolerated  - Pain and sedation regimen as ordered  Esophageal stricture s/p esophagectomy complicated by post operative esophageal rupture and J-tube leakage Severe protein calorie malnutrition, predates surgery   Leaking J tube- intermittent issue throughout stay, CT a/p looks okay position.  J-tube upsized 8/8. Tube feeding stopped again on 8/11 due to leak around J tube, yellow/green drainage overnight per nursing. 8/13 restarted trickle tube feeds, but stopped due to pain. Pain did not resolve after TF cessation and TF restarted 8/14.  Plan - Continue reglan - Continue miralax, sennosides, and docusate  - Continue TPN - Slowly trickle up TF, if does not tolerate need to brainstorm new enteral feeding plan  Bradycardia, QTC 390, question vagal events Plan -Started on Dobutamine per CTS this AM  -Cardiology Consulted    Pressure Wound - Sacrum -Continue q2h turn -Continue foam dressing  Best Practice (right click and "Reselect all SmartList Selections" daily)   Diet/type: tubefeeds and TPN DVT prophylaxis: LMWH GI prophylaxis: PPI Lines: NG, J tube, PICC Foley:  Condom cath Code Status:  full code Last date of multidisciplinary goals of care discussion: goal is LTACH   Patient critically ill due to respiratory failure Interventions to address this today vent titration Risk of deterioration without these interventions is high  I personally spent 32 minutes providing critical care not including any separately billable procedures  [name], AGACNP-BC [facility]  PCCM Pgr: [phone]   PCCM attending:  This is a 55 year old gentleman history of CAD, MI, prior stroke.  Presented for esophagectomy.  Postop course has been complicated by weakness prolonged mechanical life support made.  Ultimately ended up needing tracheostomy tube placement.  BP 95/63   Pulse 86   Temp (!) 97.2 F (36.2  C) (Oral)   Resp (!) 30   Ht 5\' 8"  (1.727 m)   Wt 53.3 kg   SpO2 100%   BMI 17.87 kg/m   General: Chronically ill-appearing frail debilitated gentleman HEENT: Tracheostomy tube in place Heart: Regular rhythm S1-S2 Lungs: Bilateral mechanically ventilated breath sounds Abdomen: Soft nontender nondistended  Labs: Reviewed  Assessment: Chronic hypoxemic respiratory failure requiring mechanical vent support. Hospital-acquired pneumonia, Pseudomonas pneumonia Left-sided loculated effusion status post chest tube placement Esophageal stricture status post esophagectomy postop esophageal rupture, J-tube leakage Severe protein calorie malnutrition Remains on TPN Recurrent episodes of bradycardia, likely related to vagal episodes with suctioning or bearing down Pressure wound on sacrum  Plan: Continue antibiotics inhaled Tobra x14 days, ceftazidime x7 days completed Remains on full vent support Continue Reglan Continue MiraLAX Continue TPN Need to attempt trickle feeds and see how he does with this. Thoracic surgery would like cardiology's input regarding bradycardic events.  They have been consulted.  This patient is critically ill with multiple organ system failure; which, requires frequent high complexity decision making, assessment, support, evaluation, and titration of therapies. This was completed through the application of advanced monitoring technologies and extensive interpretation of multiple databases. During this encounter critical care time was devoted to patient care services described in this note for 34 minutes.  , DO [facility] 09/21/2020 11:45 AM

## 2020-09-21 NOTE — Progress Notes (Signed)
At 0740 patient was noted to have bradycardia to the low 30's that resolved over 3-5 minutes back to baseline of 80's and occasionally up to the low 100's. Patient was suctioned by RN and RT but continued bradycardic cycle every 5-7 minutes. Patient reports no shortness of breath or chest pain. Dr. Cliffton Asters bedside and ordered dobutamine to infuse at 5.

## 2020-09-22 ENCOUNTER — Inpatient Hospital Stay (HOSPITAL_COMMUNITY): Payer: Medicaid Other

## 2020-09-22 DIAGNOSIS — I472 Ventricular tachycardia: Secondary | ICD-10-CM

## 2020-09-22 DIAGNOSIS — Z978 Presence of other specified devices: Secondary | ICD-10-CM | POA: Diagnosis not present

## 2020-09-22 DIAGNOSIS — Z9689 Presence of other specified functional implants: Secondary | ICD-10-CM | POA: Diagnosis not present

## 2020-09-22 DIAGNOSIS — K222 Esophageal obstruction: Secondary | ICD-10-CM | POA: Diagnosis not present

## 2020-09-22 DIAGNOSIS — Z9049 Acquired absence of other specified parts of digestive tract: Secondary | ICD-10-CM

## 2020-09-22 DIAGNOSIS — Z9889 Other specified postprocedural states: Secondary | ICD-10-CM

## 2020-09-22 DIAGNOSIS — K9413 Enterostomy malfunction: Secondary | ICD-10-CM | POA: Diagnosis not present

## 2020-09-22 LAB — GLUCOSE, CAPILLARY
Glucose-Capillary: 101 mg/dL — ABNORMAL HIGH (ref 70–99)
Glucose-Capillary: 103 mg/dL — ABNORMAL HIGH (ref 70–99)
Glucose-Capillary: 118 mg/dL — ABNORMAL HIGH (ref 70–99)
Glucose-Capillary: 119 mg/dL — ABNORMAL HIGH (ref 70–99)
Glucose-Capillary: 88 mg/dL (ref 70–99)
Glucose-Capillary: 90 mg/dL (ref 70–99)
Glucose-Capillary: 96 mg/dL (ref 70–99)

## 2020-09-22 LAB — COMPREHENSIVE METABOLIC PANEL
ALT: 61 U/L — ABNORMAL HIGH (ref 0–44)
AST: 48 U/L — ABNORMAL HIGH (ref 15–41)
Albumin: 1.9 g/dL — ABNORMAL LOW (ref 3.5–5.0)
Alkaline Phosphatase: 146 U/L — ABNORMAL HIGH (ref 38–126)
Anion gap: 4 — ABNORMAL LOW (ref 5–15)
BUN: 24 mg/dL — ABNORMAL HIGH (ref 6–20)
CO2: 27 mmol/L (ref 22–32)
Calcium: 8.6 mg/dL — ABNORMAL LOW (ref 8.9–10.3)
Chloride: 106 mmol/L (ref 98–111)
Creatinine, Ser: 0.37 mg/dL — ABNORMAL LOW (ref 0.61–1.24)
GFR, Estimated: >60 mL/min (ref >60)
Glucose, Bld: 103 mg/dL — ABNORMAL HIGH (ref 70–99)
Potassium: 4.6 mmol/L (ref 3.5–5.1)
Sodium: 137 mmol/L (ref 135–145)
Total Bilirubin: 0.6 mg/dL (ref 0.3–1.2)
Total Protein: 5.9 g/dL — ABNORMAL LOW (ref 6.5–8.1)

## 2020-09-22 LAB — ECHOCARDIOGRAM COMPLETE
AR max vel: 2.54 cm2
AV Area VTI: 2.37 cm2
AV Area mean vel: 2.33 cm2
AV Mean grad: 4 mmHg
AV Peak grad: 8.2 mmHg
Ao pk vel: 1.43 m/s
Area-P 1/2: 3.08 cm2
Height: 68 in
S' Lateral: 2.8 cm
Weight: 1880.08 oz

## 2020-09-22 LAB — CBC
HCT: 26.4 % — ABNORMAL LOW (ref 39.0–52.0)
Hemoglobin: 8.4 g/dL — ABNORMAL LOW (ref 13.0–17.0)
MCH: 29.5 pg (ref 26.0–34.0)
MCHC: 31.8 g/dL (ref 30.0–36.0)
MCV: 92.6 fL (ref 80.0–100.0)
Platelets: 708 10*3/uL — ABNORMAL HIGH (ref 150–400)
RBC: 2.85 MIL/uL — ABNORMAL LOW (ref 4.22–5.81)
RDW: 15.2 % (ref 11.5–15.5)
WBC: 15 10*3/uL — ABNORMAL HIGH (ref 4.0–10.5)
nRBC: 0 % (ref 0.0–0.2)

## 2020-09-22 LAB — MAGNESIUM: Magnesium: 2.2 mg/dL (ref 1.7–2.4)

## 2020-09-22 LAB — PHOSPHORUS: Phosphorus: 2.8 mg/dL (ref 2.5–4.6)

## 2020-09-22 MED ORDER — SODIUM CHLORIDE 0.9 % IV SOLN
2.0000 g | Freq: Three times a day (TID) | INTRAVENOUS | Status: DC
  Administered 2020-09-22 – 2020-09-23 (×3): 2 g via INTRAVENOUS
  Filled 2020-09-22 (×5): qty 2

## 2020-09-22 MED ORDER — VITAL 1.5 CAL PO LIQD
1000.0000 mL | ORAL | Status: DC
  Administered 2020-09-22 – 2020-09-27 (×3): 1000 mL

## 2020-09-22 MED ORDER — HYDROMORPHONE HCL 2 MG PO TABS
6.0000 mg | ORAL_TABLET | ORAL | Status: DC
Start: 2020-09-22 — End: 2020-09-24
  Administered 2020-09-22 – 2020-09-24 (×11): 6 mg
  Filled 2020-09-22 (×12): qty 3

## 2020-09-22 MED ORDER — FENTANYL CITRATE (PF) 100 MCG/2ML IJ SOLN
25.0000 ug | INTRAMUSCULAR | Status: DC | PRN
Start: 2020-09-22 — End: 2020-09-24
  Administered 2020-09-22 – 2020-09-24 (×4): 25 ug via INTRAVENOUS
  Filled 2020-09-22 (×5): qty 2

## 2020-09-22 MED ORDER — TRACE MINERALS CU-MN-SE-ZN 300-55-60-3000 MCG/ML IV SOLN
INTRAVENOUS | Status: AC
  Filled 2020-09-22: qty 416

## 2020-09-22 MED ORDER — OXYCODONE HCL 5 MG PO TABS
5.0000 mg | ORAL_TABLET | ORAL | Status: DC | PRN
  Administered 2020-09-22 – 2020-09-23 (×4): 5 mg
  Filled 2020-09-22 (×5): qty 1

## 2020-09-22 MED ORDER — ALBUTEROL SULFATE (2.5 MG/3ML) 0.083% IN NEBU
2.5000 mg | INHALATION_SOLUTION | RESPIRATORY_TRACT | Status: DC | PRN
  Administered 2020-09-29: 2.5 mg via RESPIRATORY_TRACT
  Filled 2020-09-22: qty 3

## 2020-09-22 MED ORDER — OXYCODONE HCL 5 MG PO TABS: 5.0000 mg | ORAL_TABLET | ORAL | Status: DC | PRN

## 2020-09-22 NOTE — Progress Notes (Addendum)
NAME:  Ralph [name], MRN:  [id number], DOB:  February 23, 1965, LOS: 21 ADMISSION DATE:  09/01/2020, CONSULTATION DATE:  8/3 REFERRING MD:  Dr. Cliffton Asters, CHIEF COMPLAINT:  esophageal rupture, esophageal stricture    History of Present Illness:  Patient is a 55 yo M w/ PMH anxiety, CAD, MI, stroke presents to [facility] on 7/28 for esophagectomy w/ Dr. Cliffton Asters for esophageal stricture.  On 7/28, Dr. Cliffton Asters performed the esophagectomy without complication.  8/3 patient taken back to OR for esophageal rupture s/p esophagectomy by Dr. Cliffton Asters. Unable to extubate post procedure. Patient remains intubated on mechanical ventilation. ABG with respiratory acidosis.    PCCM consulted for vent management. He remains critically ill  Pertinent  Medical History   Past Medical History:  Diagnosis Date   Anxiety    Coronary artery disease    Myocardial infarction [facility])    Stroke (HCC)     Significant Hospital Events: Including procedures, antibiotic start and stop dates in addition to other pertinent events   7/28 admitted for planned esophagectomy. Chest tube was placed on right chest. J tube in place. 2 JP drains in place. Patient was extubated post procedure. Patient developed subcutaneous emphysema. Unable to use J tube due to leakage around site. 8/3: PCCM consulted for vent management after pt brought back to OR for esophageal rupture/ leak. Vanc/zosyn/fluconazole started. Hypotension noted post-op. Felt 2/2 Sepsis; treated w/ volume and pressors.  8/4: Still on norepinephrine.  Central venous pressure suggesting he is still hypovolemic so receiving fluid.  Spontaneous breathing trial initiated but not ready for extubation due to work of breathing.  culture sent from chest tube.  J-tube replaced in interventional radiology due to leaking.  Tube feeds resumed in the afternoon 8/5: J-tube site again leaking, tube feeds placed on hold around midnight.Still pressor dependent . Passed SBT. WOB improved.   Failed extubation and was reintubated 8/8: J-tube upsized 8/9: Tracheostomy 8/10 J-tube continues to leak. TF stopped. RT IJ CVC removed. 8/13 starting trickle feeds 8/15 bronch 8/16 pigtail to L fluid collection  Antibiotics: Cefoxitin 7/28-7/29 Fluconazole 8/3>> Gentamicin 8/3 Zosyn 8/3>8/10 Vancomycin 8/3-8/7 Ceftazidime 8/10>8/17 Tobramycin nebs 8/10>> Flagyl 8/16 >>  Interim History / Subjective:  Dobutamine stopped yesterday afternoon. Overnight required 3 prn doses of atropine for bradycardia   Objective   Blood pressure (!) 141/93, pulse 70, temperature 98.4 F (36.9 C), temperature source Oral, resp. rate (!) 33, height 5\' 8"  (1.727 m), weight 53.3 kg, SpO2 95 %.    Vent Mode: PRVC FiO2 (%):  [40 %] 40 % Set Rate:  [15 bmp] 15 bmp Vt Set:  [540 mL] 540 mL PEEP:  [5 cmH20] 5 cmH20 Plateau Pressure:  [16 cmH20-25 cmH20] 16 cmH20   Intake/Output Summary (Last 24 hours) at 09/22/2020 0859 Last data filed at 09/22/2020 0800 Gross per 24 hour  Intake 3963.22 ml  Output 1585 ml  Net 2378.22 ml   Filed Weights   09/19/20 0600 09/20/20 0500 09/21/20 0413  Weight: 54.8 kg 53.9 kg 53.3 kg    Examination: General: thin, frail, ill appearing man lying in bed HENT: trach in place > clean, on vent  Lungs: coarse breath sounds, moderate use of accessory muscles, chest tube in place bilaterally  Cardiovascular: RRR, HR 81, no M/R/G Abdomen: soft, NT, non distended, J tube in place with slight leakage noted around gauze  Extremities: muscle wasting, trace edema Neuro: alert, follows simple commands  GU: condom cath in place  Resolved Hospital Problem list   Constipation  Assessment & Plan:   Acute respiratory failure w/ hypoxia and hypercarbia requiring mechanical ventilation. s/p tracheostomy (8/9) HAP- Pan-sensitive Pseudomonas pneumonia L loculated effusion- with increasing thrombocythemia s/p Chest Tube (8/16), culture with abundant GNR Plan  - Continue  inhaled Tobra for 14 days, Flagyl added 8/16, 7 day stopped placed - Vent Support with TC as tolerated, plans for inline PSV today  - Pain and sedation regimen as ordered  Esophageal stricture s/p esophagectomy complicated by post operative esophageal rupture and J-tube leakage Severe protein calorie malnutrition, predates surgery   Leaking J tube- intermittent issue throughout stay, CT a/p looks okay position.  J-tube upsized 8/8. Tube feeding stopped again on 8/11 due to leak around J tube, yellow/green drainage overnight per nursing. 8/13 restarted trickle tube feeds, but stopped due to pain. Pain did not resolve after TF cessation and TF restarted 8/14.  Plan - Continue reglan - Continue miralax, sennosides, and docusate  - Continue TPN - Currently on 40 ml/hr TF > continue to slowly increase   Bradycardia, QTC 390, question vagal events Plan -Cardiology Consulted >> stopped dobutamine 8/17 -Atropine PRN if prolonged    Pressure Wound - Sacrum -Continue q2h turn -Continue foam dressing  Best Practice (right click and "Reselect all SmartList Selections" daily)   Diet/type: tubefeeds and TPN. Plans for inline PMW today  DVT prophylaxis: LMWH GI prophylaxis: PPI Lines: NG, J tube, PICC Foley:  Condom cath Code Status:  full code Last date of multidisciplinary goals of care discussion: goal is LTACH   Patient critically ill due to respiratory failure Interventions to address this today vent titration Risk of deterioration without these interventions is high  I personally spent 32 minutes providing critical care not including any separately billable procedures  [name], AGACNP-BC [facility]  PCCM Pgr: [phone]

## 2020-09-22 NOTE — Progress Notes (Signed)
Nutrition Follow-up  DOCUMENTATION CODES:   Underweight, Severe malnutrition in context of chronic illness  INTERVENTION:   TPN to half goal rate today; if tolerates titration of TF to goal rate without increased leakage from J-tube then ok to discontinue TPN tomorrow  Tube Feeding: Increase to Goal: Vital 1.5 at 60 ml/hr wth Pro-Source TF 45 mL Provides 2240 kcals, 119 g of protein   NUTRITION DIAGNOSIS:   Severe Malnutrition related to chronic illness (recurrent esophageal stricture s/p J-tube) as evidenced by severe muscle depletion, severe fat depletion.  Being addressed via nutrition support  GOAL:   Patient will meet greater than or equal to 90% of their needs   MONITOR:   Vent status, Labs, Weight trends, Skin, I & O's, Other (Comment) (TPN)  REASON FOR ASSESSMENT:   Consult Enteral/tube feeding initiation and management  ASSESSMENT:   55 year old male who presented on 7/28 for esophagogastroduodenoscopy and robotic assisted esophagectomy. PMH of esophageal stricture s/p multiple dilations and J-tube placement in April 2022, stroke, CAD, MI, anxiety.  7/30 - tube feeds held due to drainage around J-tube 7/31 - tube feeds resumed at lower rate 8/01 - swallow study negative 8/02 - tube feeds held due to drainage around J-tube 8/03 - intubated, s/p esophagoscopy, thoracoscopy, decortication, and reinforcement of esophageal anastomosis 8/04 - s/p J-tube replacement in IR, tube feeds resumed 8/05 - tube feeds held due to drainage around new J-tube, extubated, later reintubated, TPN initiated 8/08 - J-tube upsized 8/08 - Trach placed, trickle TF resumed  8/11 - TF on hold  Pt remains on vent support via trach, possible TC today Chest tubes with minimal output, JP drains with no output  Tolerating Vital 1.5 at 40 ml/hr via J-tube with minimal leakage; plan to titrate to goal rate today  Continues on cyclic TPN. Plan to cut to half rate today as tolerating TF  thus far  Current wt 53 kg  Labs: reviewed Meds: reviewed   Diet Order:   Diet Order             Diet NPO time specified  Diet effective midnight                   EDUCATION NEEDS:   No education needs have been identified at this time  Skin:  Skin Assessment: Skin Integrity Issues: Skin Integrity Issues:: Stage II DTI: n/a Stage II: sacrum Incisions: abdoment, R chest x 2  Last BM:  8/18  Height:   Ht Readings from Last 1 Encounters:  09/07/20 5\' 8"  (1.727 m)    Weight:   Wt Readings from Last 1 Encounters:  09/21/20 53.3 kg    BMI:  Body mass index is 17.87 kg/m.  Estimated Nutritional Needs:   Kcal:  1950-2250 kcals  Protein:  110-125 g  Fluid:  >/= 2.0 L   09/23/20 MS, RDN, LDN, CNSC Registered Dietitian III Clinical Nutrition RD Pager and On-Call Pager Number Located in [place]

## 2020-09-22 NOTE — Progress Notes (Signed)
Patient ID: Ralph [name], male   DOB: 09/22/1965, 54 y.o.   MRN: [id number] TCTS Evening Rounds:  Hemodynamically stable  Afebrile today. Sats 100% on vent.  Urine output good. Tolerating TF at 60

## 2020-09-22 NOTE — Progress Notes (Signed)
SLP Cancellation Note  Patient Details Name: JOSEPHUS HARRIGER MRN: [id number] DOB: 1965-06-25   Cancelled treatment:       Reason Eval/Treat Not Completed: Patient not medically ready. Pt alert and communicative with board and gestures. WIll follow for readiness when heart rate more stable.    Kellianne Ek, Riley Nearing 09/22/2020, 12:20 PM

## 2020-09-22 NOTE — Progress Notes (Signed)
  Echocardiogram 2D Echocardiogram has been performed.  Ralph [name] F 09/22/2020, 1:21 PM

## 2020-09-22 NOTE — Progress Notes (Signed)
PHARMACY - TOTAL PARENTERAL NUTRITION CONSULT NOTE   Indication:  intolerance to enteral feeding , leaking J tube, s/p esophagectomy  Patient Measurements: Height: 5' 8"  (172.7 cm) Weight: 53.3 kg (117 lb 8.1 oz) IBW/kg (Calculated) : 68.4 TPN AdjBW (KG): 61 Body mass index is 17.87 kg/m. Usual Weight: 52 kg  Assessment:  86 yom with severe malnutrition and hx benign esophageal stricture with J-tube who is s/p planned robotic Ivor Lewis esophagectomy on 7/28. TF were held due to drainage around J-tube on 7/30, resumed at lower rate on 7/31. TF held again on 8/2 for continued drainage around J-tube. He was taken back to the OR on 8/3 for revision of anastomotic leak. J-tube replaced in IR on 8/4 but continues to have drainage. Significant refeeding risk noted. Pharmacy consulted for TPN.   Glucose / Insulin: no hx DM, CBGs 100-120 while on TPN, 100 while off TPN. No insulin in TPN. No SSI/24 hrs.  Electrolytes: Na 137. Cl 106. CoCa 10.2. Mg 2.2. Phos 2.8 Renal: SCr <1. BUN 24.  Hepatic: AST/ALT 48/61 - slight inc. Alk Phos 146 - slight inc. Tbili 0.6. Albumin 1.9. Prealbumin 9.5. TG 101.  Intake / Output; MIVF: UOP 0.9 ml/kg/hr. R-CT 10 cc/24h, L-CT 135 cc/24h. Minimal to no RLQ/RUQ drain output. LBM 8/17 - J tube leaking noted by MD 8/13--TF tried 8/13 then turned back off. - Tube feeds resumed at 20 ml/hr on 8/15 - increased to 30 cc/hr on 8/16, and again to 40 cc/hr on 8/17, very minimal leaking around J-tube per discussion with RN on 8/18 GI Meds: Docusate BID, Relgan 26m IV q6hr, PPI IV/24h, Senna BID, Miralax BID  GI Imaging: 8/3 CT abd/pelvis: small gas-filled collection on the left side of the mediastinum probably represents a small pocket at the esophageal-gastric anastomosis, large amount of pneumoperitoneum 8/10 Jtube positioning ok 8/12 abd x-ray: normal bowel gas pattern 8/15 CT abd: Large fluid collection in the posteromedial left hemithorax  GI Surgeries / Procedures:   8/4 J-tube replaced in IR  Central access: double lumen CVC 09/01/20, triple lumen PICC 8/5 TPN start date: 09/09/20  Nutritional Goals (per RD recommendation on 8/16): kCal: 1950-2250 kcals Protein:  110-125 g, Fluid: >= 2 L Goal TPN rate is 95 mL/hr (provides 112 g of protein and 2216 kcals per day)  Current Nutrition:  TPN Vital High Protein at 40 ml/hr (incr 8/17 afternoon, goal is 60 cc/hr) - RD recommends continuing TPN to meet 100% of needs until pt demonstrating ability to titrate TF towards goal without increased leakage  Plan:  - Continue concentrated TPN reduce by 50% today given expected TF increase to goal rate and will infuse over 24h instead of cycling - Discussed plan with RD and CCM - hopefully off TPN tomorrow if continues to tolerate TFs - Half rate TPN provides 62g of protein, 168 g of dextrose, and 36g of lipids for a total of 1181 kcals meeting 50% of patient needs - Electrolytes in TPN (no changes today): Continue 80 mEq/L Na, 65 mEq/L K, 18 mmol/L Phos, 5 mEq/L Mg, 0 mEq/L Ca. Max Cl.  - Add standard MVI and trace elements to TPN - Continue TCTS SSI q4h SSI and adjust as needed  - Monitor CBGs closely off TPN for hypoglycemia and need to increase TPN cyclic time - Monitor TPN labs on Mon/Thurs.  - Follow up advancement of tube feeds and hopeful stop of TPN fully tomorrow  Thank you for allowing pharmacy to be a part of  this patient's care.  Alycia Rossetti, PharmD, BCPS Clinical Pharmacist Clinical phone for 09/22/2020: [phone] 09/22/2020 7:39 AM   **Pharmacist phone directory can now be found on [email or web address] ([email or web address]).  Listed under [place].

## 2020-09-22 NOTE — Progress Notes (Addendum)
APP Elink Gretchin RN notified brief episode of asystole with HR -0-30 with pulse, asymptomatic, BP stable. Need to push Atropine 0.5 mg x2. Continue monitoring patient

## 2020-09-22 NOTE — TOC Progression Note (Addendum)
Transition of Care [facility]) - Progression Note    Patient Details  Name: Ralph [name] MRN: [id number] Date of Birth: 09-03-1965  Transition of [facility]) CM/SW Contact  Terrial Rhodes, LCSWA Phone Number: 09/22/2020, 3:57 PM  Clinical Narrative:     CSW spoke with Clydie Braun in admissions at [facility] to check on referral determination for possible Vent/SNF placement. Clydie Braun is going to check on referral with respiratory and call CSW back with possible determination.CSW updated patients significant other [name]. CSW will continue to follow and assist with dc planning needs.  Expected Discharge Plan:  (Vent/SNF) Barriers to Discharge: Continued Medical Work up  Expected Discharge Plan and Services Expected Discharge Plan:  (Vent/SNF) In-house Referral: Clinical Social Work     Living arrangements for the past 2 months: Single Family Home                                       Social Determinants of Health (SDOH) Interventions    Readmission Risk Interventions No flowsheet data found.

## 2020-09-22 NOTE — Progress Notes (Signed)
[address]       [place] [address]             [phone]                 15 Days Post-Op Procedure(s) (LRB): XI ROBOTIC ASSISTED THORASCOPY-DECORTICATION & REVISION OF ESOPHAGECTOMY (Right) ESOPHAGOGASTRODUODENOSCOPY (EGD) (N/A) INTERCOSTAL NERVE BLOCK (Right)   Events: Episodes of brady this am.  asymptomatic  _______________________________________________________________ Vitals: BP (!) 141/86   Pulse (!) 38   Temp 98.4 F (36.9 C) (Oral)   Resp (!) 26   Ht 5\' 8"  (1.727 m)   Wt 53.3 kg   SpO2 97%   BMI 17.87 kg/m   - Neuro: awake  - Cardiovascular: Sinus brady  Drips:       - Pulm: drainage on the L clearing up.  Serous drainage on the right Vent Mode: PRVC FiO2 (%):  [40 %] 40 % Set Rate:  [15 bmp] 15 bmp Vt Set:  [540 mL] 540 mL PEEP:  [5 cmH20] 5 cmH20 Plateau Pressure:  [16 cmH20-23 cmH20] 23 cmH20  ABG    Component Value Date/Time   PHART 7.414 09/08/2020 0444   PCO2ART 32.8 09/08/2020 0444   PO2ART 98 09/08/2020 0444   HCO3 20.7 09/08/2020 0444   TCO2 22 09/08/2020 0444   ACIDBASEDEF 3.0 (H) 09/08/2020 0444   O2SAT 97.0 09/08/2020 0444    - Abd: Soft - Extremity: Cool  .Intake/Output      08/17 0701 08/18 0700 08/18 0701 08/19 0700   P.O. 45    I.V. (mL/kg) 2517 (47.2) 464.4 (8.7)   Other 60 100   NG/GT 936.2 312.3   IV Piggyback 307 99.9   Chest Tube 0    Total Intake(mL/kg) 3865.2 (72.5) 976.7 (18.3)   Urine (mL/kg/hr) 1150 (0.9) 900 (2.2)   Drains 0    Chest Tube 135    Total Output 1285 900   Net +2580.2 +76.7           _______________________________________________________________ Labs: CBC Latest Ref Rng & Units 09/22/2020 09/21/2020 09/20/2020  WBC 4.0 - 10.5 K/uL 15.0(H) 18.1(H) 17.1(H)  Hemoglobin 13.0 - 17.0 g/dL 09/22/2020) 9.7(W) 2.6(V)  Hematocrit 39.0 - 52.0 % 26.4(L) 29.1(L) 29.4(L)  Platelets 150 - 400 K/uL 708(H) 807(H) 996(HH)   CMP Latest Ref Rng & Units 09/22/2020 09/21/2020 09/20/2020   Glucose 70 - 99 mg/dL 09/22/2020) 885(O) 277(A)  BUN 6 - 20 mg/dL 128(N) 86(V) 67(M)  Creatinine 0.61 - 1.24 mg/dL 09(O) 7.09(G) 2.83(M)  Sodium 135 - 145 mmol/L 137 137 135  Potassium 3.5 - 5.1 mmol/L 4.6 4.3 4.4  Chloride 98 - 111 mmol/L 106 103 98  CO2 22 - 32 mmol/L 27 29 26   Calcium 8.9 - 10.3 mg/dL 6.29(U) 8.9 9.1  Total Protein 6.5 - 8.1 g/dL 5.9(L) - -  Total Bilirubin 0.3 - 1.2 mg/dL 0.6 - -  Alkaline Phos 38 - 126 U/L 146(H) - -  AST 15 - 41 U/L 48(H) - -  ALT 0 - 44 U/L 61(H) - -    CXR: -  _______________________________________________________________  Assessment and Plan: POD 21 s/p robotic Ivor Lewis esophagectomy, postop day 15 status post reexploration with reinforcement of anastomosis.  Neuro: Wean sedation as tolerated CV: brady episodes intermittently.  Asymptomatic.  Will continue to monitor Pulm: Continue left chest tube to suction. Renal: Creatinine stable. GI:  On TPN. Trickle J tube feeds  Heme: Stable ID: on fortaz and tobra.  GMN rods on pleural fluid cx Endo: Sliding scale insulin Dispo: Continue ICU care.     Corliss Skains 09/22/2020 2:50 PM

## 2020-09-22 NOTE — Progress Notes (Addendum)
eLink Physician-Brief Progress Note Patient Name: Ralph [name] DOB: October 22, 1965 MRN: [id number]   Date of Service  09/22/2020  HPI/Events of Note  Patient having episodes of brief asystole per bedside RN, cardiology saw the patient earlier today and discontinued Dobutamine, and recommended observation, but he's had two further episodes tonight.  eICU Interventions  Will defer to in-house cardiology regarding next step.        Ree Alcalde U Gianlucas Evenson 09/22/2020, 1:40 AM

## 2020-09-22 NOTE — Progress Notes (Signed)
Progress Note  Patient Name: Ralph [name] Date of Encounter: 09/22/2020  [facility] Cardiologist: Buford Dresser, MD   Subjective   Longest event overnight was 6 seconds of heart block at 00:58. Discussed with bedside nurse, no hypotension, patient asymptomatic, resolved with atropine. Appears that atropine would prevent events for a brief time. On telemetry, he has multiple brief dips of bradycardia, then returns to sinus rhythm. No other changes or events.  Inpatient Medications    Scheduled Meds:  chlorhexidine gluconate (MEDLINE KIT)  15 mL Mouth Rinse BID   Chlorhexidine Gluconate Cloth  6 each Topical Daily   clonazePAM  1 mg Per Tube BID   docusate  100 mg Per Tube BID   enoxaparin (LOVENOX) injection  30 mg Subcutaneous QHS   Gerhardt's butt cream   Topical QID   HYDROmorphone  4 mg Per Tube Q4H   insulin aspart  0-24 Units Subcutaneous Q4H   liver oil-zinc oxide   Topical BID   mouth rinse  15 mL Mouth Rinse 10 times per day   metoCLOPramide (REGLAN) injection  10 mg Intravenous Q6H   midodrine  10 mg Per J Tube TID   pantoprazole (PROTONIX) IV  40 mg Intravenous Q24H   polyethylene glycol  17 g Per Tube BID   QUEtiapine  50 mg Per Tube BID   sennosides  10 mL Per Tube BID   sodium chloride flush  10 mL Intracatheter Q8H   sodium chloride flush  10-40 mL Intracatheter Q12H   tobramycin (PF)  300 mg Nebulization BID   Continuous Infusions:  sodium chloride Stopped (09/22/20 0021)   feeding supplement (VITAL 1.5 CAL) 1,000 mL (09/21/20 1823)   metronidazole Stopped (38/25/05 3976)   TPN CYCLIC-ADULT (ION) 734 mL/hr at 09/22/20 0800   PRN Meds: sodium chloride, acetaminophen (TYLENOL) oral liquid 160 mg/5 mL, atropine, fentaNYL (SUBLIMAZE) injection, levalbuterol, sodium chloride flush   Vital Signs    Vitals:   09/22/20 0600 09/22/20 0700 09/22/20 0752 09/22/20 0806  BP: 120/68 111/76 (!) 130/91   Pulse: 76 90 67   Resp: (!) 24 (!) 31 (!) 42    Temp:    98.4 F (36.9 C)  TempSrc:    Oral  SpO2: 100% 98% 96%   Weight:      Height:        Intake/Output Summary (Last 24 hours) at 09/22/2020 0814 Last data filed at 09/22/2020 0800 Gross per 24 hour  Intake 3963.22 ml  Output 1585 ml  Net 2378.22 ml   Last 3 Weights 09/21/2020 09/20/2020 09/19/2020  Weight (lbs) 117 lb 8.1 oz 118 lb 13.3 oz 120 lb 13 oz  Weight (kg) 53.3 kg 53.9 kg 54.8 kg      Telemetry    Largely sinus, with multiple brief dips in heart rate. Longest episode was 6 seconds of heart block at 00:58. - Personally Reviewed  ECG    No new since 8/14 - Personally Reviewed  Physical Exam   GEN: No acute distress.  Thin gentleman Neck: No JVD appreciated above or below trach collar Cardiac: RRR, no murmurs, rubs, or gallops.  Respiratory: Diffusely coarse, on percussion during exam, tracheostomy in place GI: Soft, nontender, non-distended  MS: No edema; No deformity. Neuro:  Nonfocal  Psych: Normal affect   Labs    High Sensitivity Troponin:  No results for input(s): TROPONINIHS in the last 720 hours.    Chemistry Recent Labs  Lab 09/18/20 [phone] 09/19/20 0444 09/20/20 0356 09/21/20  0415 09/22/20 0223  NA 139 137 135 137 137  K 3.7 4.3 4.4 4.3 4.6  CL 99 103 98 103 106  CO2 30 27 26 29 27   GLUCOSE 128* 132* 118* 116* 103*  BUN 24* 26* 25* 26* 24*  CREATININE 0.57* 0.46* 0.53* 0.46* 0.37*  CALCIUM 8.9 8.6* 9.1 8.9 8.6*  PROT 6.0* 5.9*  --   --  5.9*  ALBUMIN 2.0* 2.0*  --   --  1.9*  AST 35 47*  --   --  48*  ALT 43 50*  --   --  61*  ALKPHOS 117 131*  --   --  146*  BILITOT 1.2 1.0  --   --  0.6  GFRNONAA >60 >60 >60 >60 >60  ANIONGAP 10 7 11 5  4*     Hematology Recent Labs  Lab 09/20/20 0356 09/21/20 0415 09/22/20 0223  WBC 17.1* 18.1* 15.0*  RBC 3.21* 3.16* 2.85*  HGB 9.8* 9.5* 8.4*  HCT 29.4* 29.1* 26.4*  MCV 91.6 92.1 92.6  MCH 30.5 30.1 29.5  MCHC 33.3 32.6 31.8  RDW 15.2 15.1 15.2  PLT 996* 807* 708*    BNPNo  results for input(s): BNP, PROBNP in the last 168 hours.   DDimer No results for input(s): DDIMER in the last 168 hours.   Radiology    DG Chest Port 1 View  Result Date: 09/20/2020 CLINICAL DATA:  Left-sided chest tube. EXAM: PORTABLE CHEST 1 VIEW COMPARISON:  CT chest and chest x-ray from yesterday. FINDINGS: Unchanged tracheostomy tube, right upper extremity PICC line, and right-sided chest tubes. New left-sided chest tube with interval decrease in size of the now small loculated pleural effusion. Improving aeration in the left lung. Unchanged small right pleural effusion and right lower lobe atelectasis. No pneumothorax. No acute osseous abnormality. IMPRESSION: 1. New left-sided chest tube with interval decrease in size of the now small loculated pleural effusion. Improving aeration of the left lung. No pneumothorax. Electronically Signed   By: Titus Dubin M.D.   On: 09/20/2020 10:52    Cardiac Studies   No new  Patient Profile     55 y.o. male with complex medical history as noted, s/p recent esophagectomy complicated by prolonged respiratory failure now s/p tracheostomy, hypotension, sepsis, hospital acquired pneumonia, whom we are asked to see for intermittent, transient bradycardia.  Assessment & Plan    Intermittent bradycardia and transient complete heart block -Difficult situation. These appear to be vagally mediated based on pattern on telemetry, and they respond well to atropine. They are brief events, do not cause hypotension, and he is asymptomatic. Both esophagectomy and his respiratory status may be triggers. -ideally, would try to manage only as needed. First line is atropine. If he is symptomatic and does not respond to atropine, second line would be dopamine or isoproterenol -once he improves, especially once he is able to ambulate, I would expect these events to improve.  -as he has been asymptomatic, no indication for a pacemaker at this time. Also not ideal given  risk of infection/critical illness. If events continue once he is nearing discharge and/or he has symptomatic events, may need to re-evaluate, but would aim for conservative management while he is still critically ill -will repeat echo to make sure there is no structural change from prior  Discussed with bedside nurse, will alert [place] if there are any changes.  For questions or updates, please contact [place] Please consult [email or web address] for contact info under  Signed, Buford Dresser, MD  09/22/2020, 8:14 AM

## 2020-09-22 NOTE — Progress Notes (Signed)
Physical Therapy Treatment Patient Details Name: WALDEN STATZ MRN: [id number] DOB: 08-13-1965 Today's Date: 09/22/2020    History of Present Illness 55 y/o male admitted 7/28 for an Ivor-Lewis esophagectomy due to benign lower esophageal stricture and protein malnutrition.   8/3 pt returned to OR for esophageal rupture with exploration and repair with Jtube placement.  8/9 Tracheostomy.  PMHx: anxiety, CAD, MI, Stroke, multiple balloon dilations to esophageal stricture.    PT Comments    Pt tolerated treatment well and was more awake/alert during session. Pt required less assist on sit to stand transfer, demonstrating greater ability to power up into standing position. Pt requires multimodal cues to maintain upright posture and balance remains decreased. Continue to recommend LTACH due to pt's progression and remaining deficits.     Follow Up Recommendations  LTACH;Supervision/Assistance - 24 hour     Equipment Recommendations  Rolling walker with 5" wheels;Hospital bed    Recommendations for Other Services       Precautions / Restrictions Precautions Precautions: Fall;Other (comment) Precaution Comments: J tube, NG tube, bil chest tubes, trach, 2 jp drains Restrictions Weight Bearing Restrictions: No    Mobility  Bed Mobility               General bed mobility comments: Egressed bed to sitting position    Transfers Overall transfer level: Needs assistance Equipment used: 2 person hand held assist Transfers: Sit to/from Stand Sit to Stand: Mod assist;+2 physical assistance         General transfer comment: Performed sit to stand transfer +2 with mod A for 1st trial and min A for 2nd trial. Provided knee guarding for both trials. Mutlimodal cueing for upright posture in standing.  Ambulation/Gait             General Gait Details: Unable to assess   Stairs             Wheelchair Mobility    Modified Rankin (Stroke Patients Only)        Balance Overall balance assessment: Needs assistance Sitting-balance support: Feet supported;Bilateral upper extremity supported Sitting balance-Leahy Scale: Poor Sitting balance - Comments: Required BUE support on bed rails to maintain sitting balance. Performed for 1.34mins     Standing balance-Leahy Scale: Zero Standing balance comment: Requires BUE and knee block assist to maintain balance. Performed weight shifts in standing position x5 bilaterally                            Cognition Arousal/Alertness: Awake/alert Behavior During Therapy: Flat affect Overall Cognitive Status: Within Functional Limits for tasks assessed                                        Exercises General Exercises - Lower Extremity Hip Flexion/Marching: AAROM;Both;10 reps;Supine Mini-Sqauts: Strengthening;Both;5 reps;Standing    General Comments General comments (skin integrity, edema, etc.): HR 79-89, BP 92/70(78) seated      Pertinent Vitals/Pain Faces Pain Scale: Hurts a little bit Pain Location: Back pain in seated position or standing upright Pain Descriptors / Indicators: Discomfort;Grimacing Pain Intervention(s): Monitored during session;Repositioned    Home Living                      Prior Function            PT Goals (current goals can now be  found in the care plan section) Progress towards PT goals: Progressing toward goals    Frequency    Min 3X/week      PT Plan Current plan remains appropriate    Co-evaluation              AM-PAC PT "6 Clicks" Mobility   Outcome Measure  Help needed turning from your back to your side while in a flat bed without using bedrails?: A Lot Help needed moving from lying on your back to sitting on the side of a flat bed without using bedrails?: Total Help needed moving to and from a bed to a chair (including a wheelchair)?: Total Help needed standing up from a chair using your arms (e.g., wheelchair  or bedside chair)?: Total Help needed to walk in hospital room?: Total Help needed climbing 3-5 steps with a railing? : Total 6 Click Score: 7    End of Session   Activity Tolerance: Patient tolerated treatment well Patient left: in bed;with bed alarm set;with call bell/phone within reach Nurse Communication: Mobility status PT Visit Diagnosis: Other abnormalities of gait and mobility (R26.89);Muscle weakness (generalized) (M62.81);Difficulty in walking, not elsewhere classified (R26.2)     Time: 0488-8916 PT Time Calculation (min) (ACUTE ONLY): 30 min  Charges:  $Therapeutic Exercise: 8-22 mins $Therapeutic Activity: 8-22 mins                     Velda Shell, SPT Acute Rehab: [phone]     Vance Gather 09/22/2020, 6:05 PM

## 2020-09-23 ENCOUNTER — Inpatient Hospital Stay (HOSPITAL_COMMUNITY): Payer: Medicaid Other

## 2020-09-23 DIAGNOSIS — J9601 Acute respiratory failure with hypoxia: Secondary | ICD-10-CM | POA: Diagnosis not present

## 2020-09-23 DIAGNOSIS — Z9689 Presence of other specified functional implants: Secondary | ICD-10-CM | POA: Diagnosis not present

## 2020-09-23 LAB — CBC
HCT: 27.3 % — ABNORMAL LOW (ref 39.0–52.0)
Hemoglobin: 8.7 g/dL — ABNORMAL LOW (ref 13.0–17.0)
MCH: 30.9 pg (ref 26.0–34.0)
MCHC: 31.9 g/dL (ref 30.0–36.0)
MCV: 96.8 fL (ref 80.0–100.0)
Platelets: 746 10*3/uL — ABNORMAL HIGH (ref 150–400)
RBC: 2.82 MIL/uL — ABNORMAL LOW (ref 4.22–5.81)
RDW: 15.7 % — ABNORMAL HIGH (ref 11.5–15.5)
WBC: 13.7 10*3/uL — ABNORMAL HIGH (ref 4.0–10.5)
nRBC: 0 % (ref 0.0–0.2)

## 2020-09-23 LAB — BASIC METABOLIC PANEL
Anion gap: 5 (ref 5–15)
BUN: 24 mg/dL — ABNORMAL HIGH (ref 6–20)
CO2: 28 mmol/L (ref 22–32)
Calcium: 8.6 mg/dL — ABNORMAL LOW (ref 8.9–10.3)
Chloride: 103 mmol/L (ref 98–111)
Creatinine, Ser: 0.39 mg/dL — ABNORMAL LOW (ref 0.61–1.24)
GFR, Estimated: >60 mL/min (ref >60)
Glucose, Bld: 121 mg/dL — ABNORMAL HIGH (ref 70–99)
Potassium: 4.4 mmol/L (ref 3.5–5.1)
Sodium: 136 mmol/L (ref 135–145)

## 2020-09-23 LAB — MAGNESIUM: Magnesium: 2 mg/dL (ref 1.7–2.4)

## 2020-09-23 LAB — GLUCOSE, CAPILLARY
Glucose-Capillary: 95 mg/dL (ref 70–99)
Glucose-Capillary: 98 mg/dL (ref 70–99)
Glucose-Capillary: 124 mg/dL — ABNORMAL HIGH (ref 70–99)
Glucose-Capillary: 101 mg/dL — ABNORMAL HIGH (ref 70–99)
Glucose-Capillary: 94 mg/dL (ref 70–99)
Glucose-Capillary: 99 mg/dL (ref 70–99)

## 2020-09-23 LAB — CULTURE, BLOOD (ROUTINE X 2)
Culture: NO GROWTH
Culture: NO GROWTH

## 2020-09-23 LAB — PHOSPHORUS: Phosphorus: 3.5 mg/dL (ref 2.5–4.6)

## 2020-09-23 MED ORDER — HYDROMORPHONE HCL 1 MG/ML IJ SOLN
2.0000 mg | INTRAMUSCULAR | Status: DC | PRN
Start: 2020-09-23 — End: 2020-09-27
  Administered 2020-09-23 – 2020-09-26 (×7): 2 mg via INTRAVENOUS
  Filled 2020-09-23 (×9): qty 2

## 2020-09-23 MED ORDER — ESCITALOPRAM OXALATE 10 MG PO TABS
10.0000 mg | ORAL_TABLET | Freq: Every day | ORAL | Status: DC
  Administered 2020-09-23 – 2020-10-24 (×32): 10 mg
  Filled 2020-09-23 (×32): qty 1

## 2020-09-23 MED ORDER — VANCOMYCIN HCL IN DEXTROSE 1-5 GM/200ML-% IV SOLN
1000.0000 mg | Freq: Once | INTRAVENOUS | Status: AC
  Administered 2020-09-23: 1000 mg via INTRAVENOUS
  Filled 2020-09-23: qty 200

## 2020-09-23 MED ORDER — PROSOURCE TF PO LIQD
45.0000 mL | Freq: Three times a day (TID) | ORAL | Status: DC
  Administered 2020-09-23 – 2020-09-27 (×12): 45 mL
  Filled 2020-09-23 (×12): qty 45

## 2020-09-23 MED ORDER — SODIUM CHLORIDE 0.9 % IV SOLN
1.0000 g | Freq: Three times a day (TID) | INTRAVENOUS | Status: AC
  Administered 2020-09-23 – 2020-09-29 (×20): 1 g via INTRAVENOUS
  Filled 2020-09-23 (×21): qty 1

## 2020-09-23 MED ORDER — ENOXAPARIN SODIUM 40 MG/0.4ML IJ SOSY
40.0000 mg | PREFILLED_SYRINGE | Freq: Every day | INTRAMUSCULAR | Status: DC
  Administered 2020-09-23 – 2020-10-24 (×29): 40 mg via SUBCUTANEOUS
  Filled 2020-09-23 (×31): qty 0.4

## 2020-09-23 MED ORDER — VANCOMYCIN HCL 750 MG/150ML IV SOLN
750.0000 mg | Freq: Two times a day (BID) | INTRAVENOUS | Status: DC
  Administered 2020-09-23 – 2020-09-25 (×4): 750 mg via INTRAVENOUS
  Filled 2020-09-23 (×4): qty 150

## 2020-09-23 NOTE — Progress Notes (Signed)
[address]       [place] [address]             [phone]                 16 Days Post-Op Procedure(s) (LRB): XI ROBOTIC ASSISTED THORASCOPY-DECORTICATION & REVISION OF ESOPHAGECTOMY (Right) ESOPHAGOGASTRODUODENOSCOPY (EGD) (N/A) INTERCOSTAL NERVE BLOCK (Right)   Events: On Passy-Muir valve  _______________________________________________________________ Vitals: BP 95/69   Pulse 77   Temp 97.8 F (36.6 C) (Oral)   Resp 18   Ht 5\' 8"  (1.727 m)   Wt 54 kg   SpO2 98%   BMI 18.10 kg/m   - Neuro: awake  - Cardiovascular: Sinus  Drips:       - Pulm: Trach collar on Passy-Muir.   Drainage on the L clearing up.  Serous drainage on the right Vent Mode: PRVC FiO2 (%):  [40 %] 40 % Set Rate:  [15 bmp] 15 bmp Vt Set:  [540 mL] 540 mL PEEP:  [5 cmH20] 5 cmH20 Pressure Support:  [14 cmH20] 14 cmH20 Plateau Pressure:  [17 cmH20-19 cmH20] 17 cmH20  ABG    Component Value Date/Time   PHART 7.414 09/08/2020 0444   PCO2ART 32.8 09/08/2020 0444   PO2ART 98 09/08/2020 0444   HCO3 20.7 09/08/2020 0444   TCO2 22 09/08/2020 0444   ACIDBASEDEF 3.0 (H) 09/08/2020 0444   O2SAT 97.0 09/08/2020 0444    - Abd: Soft - Extremity: Cool  .Intake/Output      08/18 0701 08/19 0700 08/19 0701 08/20 0700   P.O.     I.V. (mL/kg) 1100.8 (20.4) 200.3 (3.7)   Other 150    NG/GT 1272.3    IV Piggyback 600 100   Chest Tube 20 10   Total Intake(mL/kg) 3143.1 (58.2) 310.3 (5.7)   Urine (mL/kg/hr) 1500 (1.2) 600 (1.8)   Drains     Chest Tube 60 40   Total Output 1560 640   Net +1583.1 -329.7           _______________________________________________________________ Labs: CBC Latest Ref Rng & Units 09/23/2020 09/22/2020 09/21/2020  WBC 4.0 - 10.5 K/uL 13.7(H) 15.0(H) 18.1(H)  Hemoglobin 13.0 - 17.0 g/dL 09/23/2020) 9.6(V) 8.9(F)  Hematocrit 39.0 - 52.0 % 27.3(L) 26.4(L) 29.1(L)  Platelets 150 - 400 K/uL 746(H) 708(H) 807(H)   CMP Latest Ref Rng & Units  09/23/2020 09/22/2020 09/21/2020  Glucose 70 - 99 mg/dL 09/23/2020) 175(Z) 025(E)  BUN 6 - 20 mg/dL 527(P) 82(U) 23(N)  Creatinine 0.61 - 1.24 mg/dL 36(R) 4.43(X) 5.40(G)  Sodium 135 - 145 mmol/L 136 137 137  Potassium 3.5 - 5.1 mmol/L 4.4 4.6 4.3  Chloride 98 - 111 mmol/L 103 106 103  CO2 22 - 32 mmol/L 28 27 29   Calcium 8.9 - 10.3 mg/dL 8.67(Y) ) 8.9  Total Protein 6.5 - 8.1 g/dL - 5.9(L) -  Total Bilirubin 0.3 - 1.2 mg/dL - 0.6 -  Alkaline Phos 38 - 126 U/L - 146(H) -  AST 15 - 41 U/L - 48(H) -  ALT 0 - 44 U/L - 61(H) -    CXR: -  _______________________________________________________________  Assessment and Plan: POD 22 s/p robotic Ivor Lewis esophagectomy, postop day 16 status post reexploration with reinforcement of anastomosis.  Neuro: Wean sedation as tolerated CV: brady episodes intermittently.  Asymptomatic.  Will continue to monitor Pulm: We will keep Blake and pigtail drains until swallow study performed. Renal: Creatinine stable. GI:  On TPN. Trickle J  tube feeds  Heme: Stable ID: on fortaz and tobra.  GMN rods on pleural fluid cx Endo: Sliding scale insulin Dispo: Continue ICU care.     Corliss Skains 09/23/2020 1:08 PM

## 2020-09-23 NOTE — TOC Progression Note (Signed)
Transition of Care [facility]) - Progression Note    Patient Details  Name: QUANTAVIUS HUMM MRN: [id number] Date of Birth: 01/09/1966  [facility]) CM/SW Contact  Terrial Rhodes, LCSWA Phone Number: 09/23/2020, 1:24 PM  Clinical Narrative:     CSW spoke with Clydie Braun with [facility]. Clydie Braun requested for CSW to fax over recent clinicals for patient. CSW faxed over clinicals for review. Patients referral pending. CSW will continue to follow and assist with dc planning needs.  Expected Discharge Plan:  (Vent/SNF) Barriers to Discharge: Continued Medical Work up  Expected Discharge Plan and Services Expected Discharge Plan:  (Vent/SNF) In-house Referral: Clinical Social Work     Living arrangements for the past 2 months: Single Family Home                                       Social Determinants of Health (SDOH) Interventions    Readmission Risk Interventions No flowsheet data found.

## 2020-09-23 NOTE — Progress Notes (Signed)
Pharmacy Antibiotic Note  Ralph [name] is a 55 y.o. male admitted on 09/01/2020 with esophageal rupture, s/p surgery and multi-organisms growing in pleural fluid Cx. Currently afebrile, wbc 13 Cr 0.4.   Pharmacy has been consulted for Vancomycin and Meropenem dosing.  Plan: Meropenem 1gm IV q8h Vancomycin 1gm IV x1 then 783m IVq12h  Height: _0  (172.7 cm) Weight: 54 kg (119 lb 0.8 oz) IBW/kg (Calculated) : 68.4  Temp (24hrs), Avg:98.4 F (36.9 C), Min:98.2 F (36.8 C), Max:98.5 F (36.9 C)  Recent Labs  Lab 09/19/20 0444 09/20/20 0356 09/21/20 0415 09/22/20 0223 09/23/20 0323 09/23/20 0440  WBC 12.4* 17.1* 18.1* 15.0* 13.7*  --   CREATININE 0.46* 0.53* 0.46* 0.37*  --  0.39*    Estimated Creatinine Clearance: 82.5 mL/min (A) (by C-G formula based on SCr of 0.39 mg/dL (L)).    No Known Allergies  Antimicrobials this admission: Cefoxitin 7/28>7/29 Vancomycin 8/3 > 8/8 Zosyn 8/3 > 8/10 Fluconazole 8/3 > 8/12 Ceftazidime 8/10 >> 8/19 Tobi inhaled 8/10>>  Metronidazole 8/16>>8/19  Dose adjustments this admission:   Microbiology results: 8/16 Pleural fluid cx: abundant GNR, Enterococcus faecalis, Prevotella Biva B-lactamase +,  8/15 BAL: no orgs seen 8/14 Bcx: ngtd  8/8 Trach aspirate: abundant pseudomonas (pan-sen) 8/4 R pleural fluid > ecoli pan sens, pesuedomonas, streptococcus grp F, prevotella denticola, bacteroides 8/3 Fungal Cx > none seen   LBonnita NasutiPharm.D. CPP, BCPS Clinical Pharmacist [phone] 72908/19/2022 11:49 AM

## 2020-09-23 NOTE — Progress Notes (Signed)
PHARMACY - TOTAL PARENTERAL NUTRITION CONSULT NOTE   Indication:  intolerance to enteral feeding , leaking J tube, s/p esophagectomy  Patient Measurements: Height: 5' 8"  (172.7 cm) Weight: 54 kg (119 lb 0.8 oz) IBW/kg (Calculated) : 68.4 TPN AdjBW (KG): 61 Body mass index is 18.1 kg/m. Usual Weight: 52 kg  Assessment:  82 yom with severe malnutrition and hx benign esophageal stricture with J-tube who is s/p planned robotic Ivor Lewis esophagectomy on 7/28. TF were held due to drainage around J-tube on 7/30, resumed at lower rate on 7/31. TF held again on 8/2 for continued drainage around J-tube. He was taken back to the OR on 8/3 for revision of anastomotic leak. J-tube replaced in IR on 8/4 but continues to have drainage. Significant refeeding risk noted. Pharmacy consulted for TPN.   Glucose / Insulin: no hx DM, CBGs 100's TPN, no insulin in TPN. No SSI/24 hrs.  Electrolytes: Na 136. Cl 103. CoCa 10.2. Mg 2. Phos 3.5 Renal: SCr <1. BUN 24.  Hepatic: AST/ALT 48/61 - slight inc. Alk Phos 146 - slight inc. Tbili 0.6. Albumin 1.9. Prealbumin 9.5. TG 101.  Intake / Output; MIVF: UOP 1.2 ml/kg/hr. No output from R-CT, L-CT 60 cc/24h. Minimal to no RLQ/RUQ drain output. LBM 8/17 - J tube leaking noted by MD 8/13--TF tried 8/13 then turned back off. - Tube feeds now advanced to goal rate with minimal leaking around J-tube site noted per discussion with RN GI Meds: Docusate BID, Relgan 76m IV q6hr, PPI IV/24h, Senna BID, Miralax BID  GI Imaging: 8/3 CT abd/pelvis: small gas-filled collection on the left side of the mediastinum probably represents a small pocket at the esophageal-gastric anastomosis, large amount of pneumoperitoneum 8/10 Jtube positioning ok 8/12 abd x-ray: normal bowel gas pattern 8/15 CT abd: Large fluid collection in the posteromedial left hemithorax  GI Surgeries / Procedures:  8/4 J-tube replaced in IR  Central access: double lumen CVC 09/01/20, triple lumen PICC  8/5 TPN start date: 09/09/20  Nutritional Goals (per RD recommendation on 8/16): kCal: 1950-2250 kcals Protein:  110-125 g, Fluid: >= 2 L Goal TPN rate is 95 mL/hr (provides 112 g of protein and 2216 kcals per day)  Current Nutrition:  TPN Vital High Protein at 40 ml/hr (incr 8/17 afternoon, goal is 60 cc/hr) - RD recommends continuing TPN to meet 100% of needs until pt demonstrating ability to titrate TF towards goal without increased leakage  Plan:  - No further TPN after completion of today's bag given tube feeds at goal and tolerating  - Will d/c TPN labs - Tapering off this evening discussed with RN - to reduce rate by 50% at 1600, then off at 1800 - Pharmacy will sign off of TPN consult, please re-consult uKoreaif additional TPN is required  Thank you for allowing pharmacy to be a part of this patient's care.  EAlycia Rossetti PharmD, BCPS Clinical Pharmacist Clinical phone for 09/23/2020: [id number]/2022 9:40 AM   **Pharmacist phone directory can now be found on [email or web address] (PW [email or web address]).  Listed under [place]

## 2020-09-23 NOTE — Progress Notes (Signed)
      [address]       [place] [address]             [phone]      BP 108/69   Pulse 63   Temp 98.4 F (36.9 C) (Oral)   Resp 17   Ht 5\' 8"  (1.727 m)   Wt 54 kg   SpO2 98%   BMI 18.10 kg/m   Back in PRVC   Intake/Output Summary (Last 24 hours) at 09/23/2020 1936 Last data filed at 09/23/2020 1800 Gross per 24 hour  Intake 3347.37 ml  Output 1450 ml  Net 1897.37 ml   Continue current Rx  Duru Reiger C. 09/25/2020, MD [facility] [phone]

## 2020-09-23 NOTE — Progress Notes (Signed)
NAME:  Ralph [name], MRN:  [id number], DOB:  11/17/1965, LOS: 22 ADMISSION DATE:  09/01/2020, CONSULTATION DATE:  8/3 REFERRING MD:  Dr. Cliffton Asters, CHIEF COMPLAINT:  esophageal rupture, esophageal stricture    History of Present Illness:  Patient is a 55 yo M w/ PMH anxiety, CAD, MI, stroke presents to [facility] on 7/28 for esophagectomy w/ Dr. Cliffton Asters for esophageal stricture.  On 7/28, Dr. Cliffton Asters performed the esophagectomy without complication.  8/3 patient taken back to OR for esophageal rupture s/p esophagectomy by Dr. Cliffton Asters. Unable to extubate post procedure. Patient remains intubated on mechanical ventilation. ABG with respiratory acidosis.    PCCM consulted for vent management. He remains critically ill  Pertinent  Medical History   Past Medical History:  Diagnosis Date   Anxiety    Coronary artery disease    Myocardial infarction [facility])    Stroke (HCC)     Significant Hospital Events: Including procedures, antibiotic start and stop dates in addition to other pertinent events   7/28 admitted for planned esophagectomy. Chest tube was placed on right chest. J tube in place. 2 JP drains in place. Patient was extubated post procedure. Patient developed subcutaneous emphysema. Unable to use J tube due to leakage around site. 8/3: PCCM consulted for vent management after pt brought back to OR for esophageal rupture/ leak. Vanc/zosyn/fluconazole started. Hypotension noted post-op. Felt 2/2 Sepsis; treated w/ volume and pressors.  8/4: Still on norepinephrine.  Central venous pressure suggesting he is still hypovolemic so receiving fluid.  Spontaneous breathing trial initiated but not ready for extubation due to work of breathing.  culture sent from chest tube.  J-tube replaced in interventional radiology due to leaking.  Tube feeds resumed in the afternoon 8/5: J-tube site again leaking, tube feeds placed on hold around midnight.Still pressor dependent . Passed SBT. WOB improved.   Failed extubation and was reintubated 8/8: J-tube upsized 8/9: Tracheostomy 8/10 J-tube continues to leak. TF stopped. RT IJ CVC removed. 8/13 starting trickle feeds 8/15 bronch 8/16 pigtail to L fluid collection  Antibiotics: Cefoxitin 7/28-7/29 Fluconazole 8/3>>8/17 Gentamicin 8/3 Zosyn 8/3>8/10 Vancomycin 8/3-8/7 Ceftazidime 8/10>> Tobramycin nebs 8/10>> Flagyl 8/16 >>  Interim History / Subjective:  No events. Having some breakthrough pain. Appears depressed.  Objective   Blood pressure 103/75, pulse 75, temperature 98.2 F (36.8 C), temperature source Oral, resp. rate 19, height 5\' 8"  (1.727 m), weight 54 kg, SpO2 100 %.    Vent Mode: PRVC FiO2 (%):  [40 %] 40 % Set Rate:  [15 bmp] 15 bmp Vt Set:  [540 mL] 540 mL PEEP:  [5 cmH20] 5 cmH20 Plateau Pressure:  [16 cmH20-23 cmH20] 18 cmH20   Intake/Output Summary (Last 24 hours) at 09/23/2020 0730 Last data filed at 09/23/2020 0700 Gross per 24 hour  Intake 3143.09 ml  Output 1560 ml  Net 1583.09 ml    Filed Weights   09/20/20 0500 09/21/20 0413 09/23/20 0500  Weight: 53.9 kg 53.3 kg 54 kg    Examination: Thin man laying in bed on vent Trach in place with small thick secretions L chest tube with purulent output 60cc, R with no output Rhonci on lungs Ext with muscle wasting J tube looks good, no major leakage  BMP looks good CBC does show plts going up again CXR pending  Resolved Hospital Problem list   Constipation   Assessment & Plan:   Acute respiratory failure w/ hypoxia and hypercarbia requiring mechanical ventilation. s/p tracheostomy (8/9) HAP- Pan-sensitive Pseudomonas pneumonia L loculated  effusion- with increasing thrombocythemia s/p Chest Tube (8/16), culture with abundant GNR Plan  - Check CXR, f/u pleural fluid culture; flagyl/ceftaz until we have organism identification; tobra nebs x 14 days - TC trial today; if fails will do in line PMV with atropine available PRN - Pain and sedation  regimen as ordered  Esophageal stricture s/p esophagectomy complicated by post operative esophageal rupture and J-tube leakage Severe protein calorie malnutrition, predates surgery   Leaking J tube- intermittent issue throughout stay, CT a/p looks okay position.  J-tube upsized 8/8. Tube feeding stopped again on 8/11 due to leak around J tube, yellow/green drainage overnight per nursing. 8/13 restarted trickle tube feeds, but stopped due to pain. Pain did not resolve after TF cessation and TF restarted 8/14.  Plan - Continue reglan - Continue miralax, sennosides, and docusate  - TPN to stop today - TF to goal  Bradycardia, QTC 390, question vagal events Plan -Cardiology Consulted >> stopped dobutamine 8/17 -Atropine PRN if prolonged    Pressure Wound - Sacrum -Continue q2h turn -Continue foam dressing  Depression- start lexapro; continue seroquel, AM EKG  Best Practice (right click and "Reselect all SmartList Selections" daily)   Diet/type: TF DVT prophylaxis: LMWH GI prophylaxis: PPI Lines: NG, J tube, PICC Foley:  Condom cath Code Status:  full code Last date of multidisciplinary goals of care discussion: goal is LTACH   Patient critically ill due to respiratory failure Interventions to address this today vent titration Risk of deterioration without these interventions is high  I personally spent 34 minutes providing critical care not including any separately billable procedures  Myrla Halsted MD PCCM

## 2020-09-23 NOTE — Evaluation (Signed)
Passy-Muir Speaking Valve - Evaluation Patient Details  Name: Ralph [name] MRN: [id number] Date of Birth: 1965-04-04  Today's Date: 09/23/2020 Time: 0840-0908 SLP Time Calculation (min) (ACUTE ONLY): 28 min  Past Medical History:  Past Medical History:  Diagnosis Date   Anxiety    Coronary artery disease    Myocardial infarction (HCC)    Stroke [facility])    Past Surgical History:  Past Surgical History:  Procedure Laterality Date   APPENDECTOMY     BALLOON DILATION N/A 06/01/2019   Procedure: BALLOON DILATION;  Surgeon: Vida Rigger, MD;  Location: [facility] ENDOSCOPY;  Service: Endoscopy;  Laterality: N/A;   BALLOON DILATION N/A 07/16/2019   Procedure: BALLOON DILATION;  Surgeon: Vida Rigger, MD;  Location: [facility] ENDOSCOPY;  Service: Endoscopy;  Laterality: N/A;   BALLOON DILATION N/A 09/09/2019   Procedure: BALLOON DILATION;  Surgeon: Vida Rigger, MD;  Location: [facility] ENDOSCOPY;  Service: Endoscopy;  Laterality: N/A;   BIOPSY  04/29/2019   Procedure: BIOPSY;  Surgeon: Charlott Rakes, MD;  Location: [facility] ENDOSCOPY;  Service: Endoscopy;;   BIOPSY  05/27/2019   Procedure: BIOPSY;  Surgeon: [name], MD;  Location: [facility] ENDOSCOPY;  Service: Endoscopy;;   BIOPSY  06/01/2019   Procedure: BIOPSY;  Surgeon: Vida Rigger, MD;  Location: [facility] ENDOSCOPY;  Service: Endoscopy;;   BIOPSY  07/16/2019   Procedure: BIOPSY;  Surgeon: Vida Rigger, MD;  Location: [facility] ENDOSCOPY;  Service: Endoscopy;;   BIOPSY  05/27/2020   Procedure: BIOPSY;  Surgeon: Napoleon Form, MD;  Location: [facility] ENDOSCOPY;  Service: Endoscopy;;   ESOPHAGOGASTRODUODENOSCOPY N/A 06/01/2019   Procedure: ESOPHAGOGASTRODUODENOSCOPY (EGD);  Surgeon: Vida Rigger, MD;  Location: [facility] ENDOSCOPY;  Service: Endoscopy;  Laterality: N/A;   ESOPHAGOGASTRODUODENOSCOPY N/A 05/31/2020   Procedure: ESOPHAGOGASTRODUODENOSCOPY (EGD);  Surgeon: Corliss Skains, MD;  Location: [facility] OR;  Service: Thoracic;  Laterality: N/A;   ESOPHAGOGASTRODUODENOSCOPY N/A 09/01/2020    Procedure: ESOPHAGOGASTRODUODENOSCOPY (EGD);  Surgeon: Corliss Skains, MD;  Location: [facility] OR;  Service: Thoracic;  Laterality: N/A;   ESOPHAGOGASTRODUODENOSCOPY N/A 09/07/2020   Procedure: ESOPHAGOGASTRODUODENOSCOPY (EGD);  Surgeon: Corliss Skains, MD;  Location: [facility] OR;  Service: Thoracic;  Laterality: N/A;   ESOPHAGOGASTRODUODENOSCOPY (EGD) WITH PROPOFOL N/A 04/29/2019   Procedure: ESOPHAGOGASTRODUODENOSCOPY (EGD) WITH PROPOFOL;  Surgeon: Charlott Rakes, MD;  Location: [facility] ENDOSCOPY;  Service: Endoscopy;  Laterality: N/A;   ESOPHAGOGASTRODUODENOSCOPY (EGD) WITH PROPOFOL N/A 05/27/2019   Procedure: ESOPHAGOGASTRODUODENOSCOPY (EGD) WITH PROPOFOL;  Surgeon: [name], MD;  Location: [facility] ENDOSCOPY;  Service: Endoscopy;  Laterality: N/A;   ESOPHAGOGASTRODUODENOSCOPY (EGD) WITH PROPOFOL N/A 07/16/2019   Procedure: ESOPHAGOGASTRODUODENOSCOPY (EGD) WITH PROPOFOL;  Surgeon: Vida Rigger, MD;  Location: [facility] ENDOSCOPY;  Service: Endoscopy;  Laterality: N/A;   ESOPHAGOGASTRODUODENOSCOPY (EGD) WITH PROPOFOL N/A 09/09/2019   Procedure: ESOPHAGOGASTRODUODENOSCOPY (EGD) WITH PROPOFOL;  Surgeon: Vida Rigger, MD;  Location: [facility] ENDOSCOPY;  Service: Endoscopy;  Laterality: N/A;   ESOPHAGOGASTRODUODENOSCOPY (EGD) WITH PROPOFOL N/A 05/27/2020   Procedure: ESOPHAGOGASTRODUODENOSCOPY (EGD) WITH PROPOFOL;  Surgeon: Napoleon Form, MD;  Location: [facility] ENDOSCOPY;  Service: Endoscopy;  Laterality: N/A;   FINE NEEDLE ASPIRATION  09/09/2019   Procedure: FINE NEEDLE ASPIRATION (FNA) LINEAR;  Surgeon: Vida Rigger, MD;  Location: [facility] ENDOSCOPY;  Service: Endoscopy;;   FOREIGN BODY REMOVAL  07/16/2019   Procedure: FOREIGN BODY REMOVAL;  Surgeon: Vida Rigger, MD;  Location: [facility] ENDOSCOPY;  Service: Endoscopy;;   FOREIGN BODY REMOVAL  05/27/2020   Procedure: FOREIGN BODY REMOVAL;  Surgeon: Napoleon Form, MD;  Location: [facility] ENDOSCOPY;  Service: Endoscopy;;  INTERCOSTAL NERVE BLOCK Right 09/01/2020   Procedure:  INTERCOSTAL NERVE BLOCK;  Surgeon: Corliss Skains, MD;  Location: [facility] OR;  Service: Thoracic;  Laterality: Right;   INTERCOSTAL NERVE BLOCK Right 09/07/2020   Procedure: INTERCOSTAL NERVE BLOCK;  Surgeon: Corliss Skains, MD;  Location: [facility] OR;  Service: Thoracic;  Laterality: Right;   IR REPLC DUODEN/JEJUNO TUBE PERCUT W/FLUORO  09/08/2020   IR REPLC DUODEN/JEJUNO TUBE PERCUT W/FLUORO  09/12/2020   LAPAROSCOPIC JEJUNOSTOMY N/A 05/31/2020   Procedure: LAPAROSCOPIC JEJUNOSTOMY;  Surgeon: Corliss Skains, MD;  Location: [facility] OR;  Service: Thoracic;  Laterality: N/A;  EGD, C-arm required.   SHOULDER SURGERY     UPPER ESOPHAGEAL ENDOSCOPIC ULTRASOUND (EUS) N/A 09/09/2019   Procedure: UPPER ESOPHAGEAL ENDOSCOPIC ULTRASOUND (EUS);  Surgeon: [name], MD;  Location: [facility] ENDOSCOPY;  Service: Endoscopy;  Laterality: N/A;   HPI:  55 y/o male admitted 7/28 for an Ivor-Lewis esophagectomy due to benign lower esophageal stricture and protein malnutrition.   8/3 pt returned to OR for esophageal rupture with exploration and repair with Jtube placement.  8/9 Tracheostomy.  Had episodes of asymptomatic asystole on 8/17 and bradycardia. Cardiology believes these may be vagally mediated.  PMHx: anxiety, CAD, MI, Stroke, multiple balloon dilations to esophageal stricture.   Assessment / Plan / Recommendation Clinical Impression  PMV eval was coordinated with RT, who transitioned him from PSV to TC. RN and MD also present given recent episodes of bradycardia. RT gradually deflated cuff and PMV was placed with immediate phonation achieved, although wet in quality. When cued, pt has a strong sounding cough that is productive of secretions, and he can use the yankauer with minimal assistance to clear secretions orally. This improved his vocal quality and intelligibility, although speech clarity is still at least mildly reduced at the phrase to sentence level. Valve was left in place for 20 minutes without any  evidence of back pressure. Pt did brady down during episodes of coughing, reaching as low as 33 but rebounding spontaneously as coughing stopped. Note that when PMV was doffed, pt still has coughing episodes with associated bradycardia, with coughing more prolonged as he is not able to clear his secretions without PMV donned (RT provided tracheal suction). Considering his cardiac status, would not leave PMV in place without staff present; however, when strict full supervision can be provided, would place valve for short intervals (~15 min) while on TC to facilitate communication as well as secretion management. Plan was discussed with MD (Dr. Katrinka Blazing) who is in agreement. SLP Visit Diagnosis: Aphonia (R49.1)    SLP Assessment  Patient needs continued Speech Lanaguage Pathology Services    Follow Up Recommendations  LTACH    Frequency and Duration min 2x/week  2 weeks    PMSV Trial PMSV was placed for: 20 min Able to redirect subglottic air through upper airway: Yes Able to Attain Phonation: Yes Voice Quality: Normal Able to Expectorate Secretions: Yes Level of Secretion Expectoration with PMSV: Oral Breath Support for Phonation: Mildly decreased Intelligibility: Intelligibility reduced Phrase: 75-100% accurate Respirations During Trial:  (20s, up to low 30s) SpO2 During Trial:  (ranged from 89-00%, primarily in low-mid 90s) Pulse During Trial:  (primarily in the 60s, ranged from 33-78) Behavior: Alert;Controlled;Cooperative   Tracheostomy Tube       Vent Dependency  Vent Mode: (S) CPAP;PSV (Placed on CPAP / PS by Dr. Katrinka Blazing.) PEEP: 5 cmH20 Pressure Support: 14 cmH20 FiO2 (%): 40 %    Cuff Deflation Trial  GO Tolerated  Cuff Deflation: Yes Length of Time for Cuff Deflation Trial: >20 min Behavior: Alert;Controlled;Cooperative        Mahala Menghini., M.A. CCC-SLP Acute Rehabilitation Services Pager [phone] Office [phone]  09/23/2020, 9:30 AM

## 2020-09-23 NOTE — Progress Notes (Signed)
Progress Note  Patient Name: Ralph [name] Date of Encounter: 09/23/2020  [facility] Cardiologist: Buford Dresser, MD   Subjective   Seen while speech therapy at bedside, trialing speaking valve. No pain. Did observe him suction himself, with gradual bradycardia into the 30s, then returning to baseline heart rate after suctioning. Had 6 sec pause yesterday but no significant pauses yet today.  Inpatient Medications    Scheduled Meds:  chlorhexidine gluconate (MEDLINE KIT)  15 mL Mouth Rinse BID   Chlorhexidine Gluconate Cloth  6 each Topical Daily   clonazePAM  1 mg Per Tube BID   docusate  100 mg Per Tube BID   enoxaparin (LOVENOX) injection  40 mg Subcutaneous QHS   escitalopram  10 mg Per Tube QHS   Gerhardt's butt cream   Topical QID   HYDROmorphone  6 mg Per Tube Q4H   insulin aspart  0-24 Units Subcutaneous Q4H   liver oil-zinc oxide   Topical BID   mouth rinse  15 mL Mouth Rinse 10 times per day   metoCLOPramide (REGLAN) injection  10 mg Intravenous Q6H   midodrine  10 mg Per J Tube TID   pantoprazole (PROTONIX) IV  40 mg Intravenous Q24H   polyethylene glycol  17 g Per Tube BID   QUEtiapine  50 mg Per Tube BID   sennosides  10 mL Per Tube BID   sodium chloride flush  10 mL Intracatheter Q8H   sodium chloride flush  10-40 mL Intracatheter Q12H   tobramycin (PF)  300 mg Nebulization BID   Continuous Infusions:  sodium chloride Stopped (09/22/20 0021)   cefTAZidime (FORTAZ)  IV Stopped (09/23/20 0646)   feeding supplement (VITAL 1.5 CAL) 1,000 mL (09/23/20 0616)   metronidazole Stopped (09/23/20 0300)   TPN ADULT (ION) 50 mL/hr at 09/23/20 0700   PRN Meds: sodium chloride, acetaminophen (TYLENOL) oral liquid 160 mg/5 mL, albuterol, atropine, fentaNYL (SUBLIMAZE) injection, HYDROmorphone (DILAUDID) injection, sodium chloride flush   Vital Signs    Vitals:   09/23/20 0700 09/23/20 0738 09/23/20 0745 09/23/20 0845  BP: 103/75  103/75 120/79  Pulse: 75   76 72  Resp: 19  (!) 28 (!) 23  Temp:  98.4 F (36.9 C)    TempSrc:  Oral    SpO2: 100%  98% 93%  Weight:      Height:        Intake/Output Summary (Last 24 hours) at 09/23/2020 0914 Last data filed at 09/23/2020 0700 Gross per 24 hour  Intake 2842.41 ml  Output 1260 ml  Net 1582.41 ml   Last 3 Weights 09/23/2020 09/21/2020 09/20/2020  Weight (lbs) 119 lb 0.8 oz 117 lb 8.1 oz 118 lb 13.3 oz  Weight (kg) 54 kg 53.3 kg 53.9 kg      Telemetry    Largely sinus, with multiple brief dips in heart rate. Had an episode of complete heart block with 6.4 second R-R 8/18 afternoon, no other prolonged events since. - Personally Reviewed  ECG    8/18 NSR - Personally Reviewed  Physical Exam   GEN: Thin gentleman, no acute distress NECK: No JVD appreciated though trach collar limits evaluation CARDIAC: regular rhythm, normal S1 and S2, no rubs or gallops. No murmur. VASCULAR: Radial pulses 2+ bilaterally.  RESPIRATORY:  Tracheostomy in place, on trach collar with speaking valve, coarse breath sounds but improved ABDOMEN: Soft, non-tender, non-distended MUSCULOSKELETAL:  Moves all 4 limbs independently SKIN: Warm and dry, no edema PSYCHIATRIC:  Normal affect  Labs    High Sensitivity Troponin:  No results for input(s): TROPONINIHS in the last 720 hours.    Chemistry Recent Labs  Lab 09/18/20 0318 09/19/20 0444 09/20/20 0356 09/21/20 0415 09/22/20 0223 09/23/20 0440  NA 139 137   < > 137 137 136  K 3.7 4.3   < > 4.3 4.6 4.4  CL 99 103   < > 103 106 103  CO2 30 27   < > 29 27 28   GLUCOSE 128* 132*   < > 116* 103* 121*  BUN 24* 26*   < > 26* 24* 24*  CREATININE 0.57* 0.46*   < > 0.46* 0.37* 0.39*  CALCIUM 8.9 8.6*   < > 8.9 8.6* 8.6*  PROT 6.0* 5.9*  --   --  5.9*  --   ALBUMIN 2.0* 2.0*  --   --  1.9*  --   AST 35 47*  --   --  48*  --   ALT 43 50*  --   --  61*  --   ALKPHOS 117 131*  --   --  146*  --   BILITOT 1.2 1.0  --   --  0.6  --   GFRNONAA >60 >60   < > >60  >60 >60  ANIONGAP 10 7   < > 5 4* 5   < > = values in this interval not displayed.     Hematology Recent Labs  Lab 09/21/20 0415 09/22/20 0223 09/23/20 0323  WBC 18.1* 15.0* 13.7*  RBC 3.16* 2.85* 2.82*  HGB 9.5* 8.4* 8.7*  HCT 29.1* 26.4* 27.3*  MCV 92.1 92.6 96.8  MCH 30.1 29.5 30.9  MCHC 32.6 31.8 31.9  RDW 15.1 15.2 15.7*  PLT 807* 708* 746*    BNPNo results for input(s): BNP, PROBNP in the last 168 hours.   DDimer No results for input(s): DDIMER in the last 168 hours.   Radiology    ECHOCARDIOGRAM COMPLETE  Result Date: 09/22/2020    ECHOCARDIOGRAM REPORT   Patient Name:   ELIN SEATS [facility] Date of Exam: 09/22/2020 Medical Rec #:  [id number]    Height:       68.0 in Accession #:    [id number]   Weight:       117.5 lb Date of Birth:  Feb 10, 1965    BSA:          1.630 m Patient Age:    55 years     BP:           105/73 mmHg Patient Gender: M            HR:           60 bpm. Exam Location:  Inpatient Procedure: 2D Echo, Cardiac Doppler and Color Doppler Indications:    Ventricular Tachycardia I47.2  History:        Patient has prior history of Echocardiogram examinations, most                 recent 04/30/2019. CAD; Risk Factors:Hypertension and                 Dyslipidemia.  Sonographer:    Merrie Roof RDCS Referring Phys: [id number] [place]  1. Left ventricular ejection fraction, by estimation, is 60 to 65%. The left ventricle has normal function. The left ventricle has no regional wall motion abnormalities. There is mild left ventricular hypertrophy. Left ventricular diastolic parameters were normal.  2. Right ventricular systolic function  is normal. The right ventricular size is normal. Tricuspid regurgitation signal is inadequate for assessing PA pressure.  3. The mitral valve is normal in structure. No evidence of mitral valve regurgitation. No evidence of mitral stenosis.  4. The aortic valve was not well visualized. Aortic valve regurgitation is not visualized.  No aortic stenosis is present. FINDINGS  Left Ventricle: Left ventricular ejection fraction, by estimation, is 60 to 65%. The left ventricle has normal function. The left ventricle has no regional wall motion abnormalities. The left ventricular internal cavity size was normal in size. There is  mild left ventricular hypertrophy. Left ventricular diastolic parameters were normal. Right Ventricle: The right ventricular size is normal. No increase in right ventricular wall thickness. Right ventricular systolic function is normal. Tricuspid regurgitation signal is inadequate for assessing PA pressure. Left Atrium: Left atrial size was normal in size. Right Atrium: Right atrial size was normal in size. Pericardium: There is no evidence of pericardial effusion. Mitral Valve: The mitral valve is normal in structure. No evidence of mitral valve regurgitation. No evidence of mitral valve stenosis. Tricuspid Valve: The tricuspid valve is normal in structure. Tricuspid valve regurgitation is not demonstrated. Aortic Valve: The aortic valve was not well visualized. Aortic valve regurgitation is not visualized. No aortic stenosis is present. Aortic valve mean gradient measures 4.0 mmHg. Aortic valve peak gradient measures 8.2 mmHg. Aortic valve area, by VTI measures 2.37 cm. Pulmonic Valve: The pulmonic valve was not well visualized. Pulmonic valve regurgitation is not visualized. Aorta: The aortic root is normal in size and structure. IAS/Shunts: The interatrial septum was not well visualized.  LEFT VENTRICLE PLAX 2D LVIDd:         4.40 cm  Diastology LVIDs:         2.80 cm  LV e' medial:    6.83 cm/s LV PW:         1.20 cm  LV E/e' medial:  10.4 LV IVS:        1.40 cm  LV e' lateral:   10.00 cm/s LVOT diam:     1.80 cm  LV E/e' lateral: 7.1 LV SV:         66 LV SV Index:   40 LVOT Area:     2.54 cm  RIGHT VENTRICLE             IVC RV Basal diam:  2.90 cm     IVC diam: 2.10 cm RV S prime:     15.20 cm/s TAPSE (M-mode): 2.0 cm  LEFT ATRIUM             Index       RIGHT ATRIUM           Index LA diam:        3.10 cm 1.90 cm/m  RA Area:     11.60 cm LA Vol (A2C):   57.2 ml 35.09 ml/m RA Volume:   23.50 ml  14.41 ml/m LA Vol (A4C):   28.2 ml 17.30 ml/m LA Biplane Vol: 41.2 ml 25.27 ml/m  AORTIC VALVE AV Area (Vmax):    2.54 cm AV Area (Vmean):   2.33 cm AV Area (VTI):     2.37 cm AV Vmax:           143.00 cm/s AV Vmean:          95.100 cm/s AV VTI:            0.278 m AV Peak Grad:  8.2 mmHg AV Mean Grad:      4.0 mmHg LVOT Vmax:         143.00 cm/s LVOT Vmean:        86.900 cm/s LVOT VTI:          0.259 m LVOT/AV VTI ratio: 0.93  AORTA Ao Root diam: 3.60 cm MITRAL VALVE MV Area (PHT): 3.08 cm    SHUNTS MV Decel Time: 246 msec    Systemic VTI:  0.26 m MV E velocity: 71.30 cm/s  Systemic Diam: 1.80 cm MV A velocity: 68.10 cm/s MV E/A ratio:  1.05 Oswaldo Milian MD Electronically signed by Oswaldo Milian MD Signature Date/Time: 09/22/2020/3:51:07 PM    Final     Cardiac Studies   Echo 09/22/20 1. Left ventricular ejection fraction, by estimation, is 60 to 65%. The  left ventricle has normal function. The left ventricle has no regional  wall motion abnormalities. There is mild left ventricular hypertrophy.  Left ventricular diastolic parameters  were normal.   2. Right ventricular systolic function is normal. The right ventricular  size is normal. Tricuspid regurgitation signal is inadequate for assessing  PA pressure.   3. The mitral valve is normal in structure. No evidence of mitral valve  regurgitation. No evidence of mitral stenosis.   4. The aortic valve was not well visualized. Aortic valve regurgitation  is not visualized. No aortic stenosis is present.   Patient Profile     55 y.o. male with complex medical history as noted, s/p recent esophagectomy complicated by prolonged respiratory failure now s/p tracheostomy, hypotension, sepsis, hospital acquired pneumonia, whom we are asked to see for  intermittent, transient bradycardia.  Assessment & Plan    Intermittent bradycardia and transient complete heart block -Difficult situation. These appear to be vagally mediated based on pattern on telemetry, and they respond well to atropine. They are brief events, do not cause hypotension, and he is asymptomatic. Both esophagectomy and his respiratory status may be triggers. -ideally, would try to manage only as needed. First line is atropine. If he is symptomatic and does not respond to atropine, second line would be dopamine or isoproterenol -once he improves, especially once he is able to ambulate, I would expect these events to improve.  -as he has been asymptomatic, no indication for a pacemaker at this time. Also not ideal given risk of infection/critical illness. If events continue once he is nearing discharge and/or he has symptomatic events, may need to re-evaluate, but would aim for conservative management while he is still critically ill -echo reassuring  For questions or updates, please contact [place] Please consult [email or web address] for contact info under     Signed, Buford Dresser, MD  09/23/2020, 9:14 AM

## 2020-09-23 NOTE — Progress Notes (Signed)
Nutrition Follow-up  DOCUMENTATION CODES:   Underweight, Severe malnutrition in context of chronic illness  INTERVENTION:   Discontinue TPN today  Tube Feeding via J-tube Continue Vital 1.5 at 60 ml/hr Add Pro-Source TF 45 mL TID today Provides 2280 kcals, 130 g of protein, 1094 mL of free water   NUTRITION DIAGNOSIS:   Severe Malnutrition related to chronic illness (recurrent esophageal stricture s/p J-tube) as evidenced by severe muscle depletion, severe fat depletion.  Being addressed via TF   GOAL:   Patient will meet greater than or equal to 90% of their needs  Progressing  MONITOR:   Vent status, Labs, Weight trends, Skin, I & O's, Other (Comment) (TPN)  REASON FOR ASSESSMENT:   Consult Enteral/tube feeding initiation and management  ASSESSMENT:   55 year old male who presented on 7/28 for esophagogastroduodenoscopy and robotic assisted esophagectomy. PMH of esophageal stricture s/p multiple dilations and J-tube placement in April 2022, stroke, CAD, MI, anxiety.  Pt currently tolerating TC, trial of PMV today  Tolerating Vital 1.5 at 60 ml/hr with minimal leakage around J-tube  TPN at half today, plan to discontinue tonight as pt tolerating TF at goal with minimal leakage  Chest tubes with small amount of drainage  Weight appears to be stabilizing around 54 kg  Labs: reviewed Meds: ss novolog, reglan   Diet Order:   Diet Order             Diet NPO time specified  Diet effective midnight                   EDUCATION NEEDS:   No education needs have been identified at this time  Skin:  Skin Assessment: Skin Integrity Issues: Skin Integrity Issues:: Stage II DTI: n/a Stage II: sacrum Incisions: abdoment, R chest x 2  Last BM:  8/18  Height:   Ht Readings from Last 1 Encounters:  09/07/20 5\' 8"  (1.727 m)    Weight:   Wt Readings from Last 1 Encounters:  09/23/20 54 kg    BMI:  Body mass index is 18.1 kg/m.  Estimated  Nutritional Needs:   Kcal:  1950-2250 kcals  Protein:  110-125 g  Fluid:  >/= 2.0 L   09/25/20 MS, RDN, LDN, CNSC Registered Dietitian III Clinical Nutrition RD Pager and On-Call Pager Number Located in [place]

## 2020-09-24 ENCOUNTER — Inpatient Hospital Stay (HOSPITAL_COMMUNITY): Payer: Medicaid Other

## 2020-09-24 DIAGNOSIS — Z978 Presence of other specified devices: Secondary | ICD-10-CM | POA: Diagnosis not present

## 2020-09-24 DIAGNOSIS — I442 Atrioventricular block, complete: Secondary | ICD-10-CM | POA: Diagnosis not present

## 2020-09-24 DIAGNOSIS — K9413 Enterostomy malfunction: Secondary | ICD-10-CM | POA: Diagnosis not present

## 2020-09-24 DIAGNOSIS — Z9689 Presence of other specified functional implants: Secondary | ICD-10-CM | POA: Diagnosis not present

## 2020-09-24 LAB — CBC
HCT: 27.1 % — ABNORMAL LOW (ref 39.0–52.0)
Hemoglobin: 8.7 g/dL — ABNORMAL LOW (ref 13.0–17.0)
MCH: 29.8 pg (ref 26.0–34.0)
MCHC: 32.1 g/dL (ref 30.0–36.0)
MCV: 92.8 fL (ref 80.0–100.0)
Platelets: 725 10*3/uL — ABNORMAL HIGH (ref 150–400)
RBC: 2.92 MIL/uL — ABNORMAL LOW (ref 4.22–5.81)
RDW: 15.3 % (ref 11.5–15.5)
WBC: 16.7 10*3/uL — ABNORMAL HIGH (ref 4.0–10.5)
nRBC: 0 % (ref 0.0–0.2)

## 2020-09-24 LAB — BASIC METABOLIC PANEL
Anion gap: 6 (ref 5–15)
BUN: 23 mg/dL — ABNORMAL HIGH (ref 6–20)
CO2: 31 mmol/L (ref 22–32)
Calcium: 8.9 mg/dL (ref 8.9–10.3)
Chloride: 99 mmol/L (ref 98–111)
Creatinine, Ser: 0.38 mg/dL — ABNORMAL LOW (ref 0.61–1.24)
GFR, Estimated: >60 mL/min (ref >60)
Glucose, Bld: 101 mg/dL — ABNORMAL HIGH (ref 70–99)
Potassium: 4.5 mmol/L (ref 3.5–5.1)
Sodium: 136 mmol/L (ref 135–145)

## 2020-09-24 LAB — BODY FLUID CULTURE W GRAM STAIN

## 2020-09-24 LAB — GLUCOSE, CAPILLARY
Glucose-Capillary: 109 mg/dL — ABNORMAL HIGH (ref 70–99)
Glucose-Capillary: 111 mg/dL — ABNORMAL HIGH (ref 70–99)
Glucose-Capillary: 111 mg/dL — ABNORMAL HIGH (ref 70–99)
Glucose-Capillary: 115 mg/dL — ABNORMAL HIGH (ref 70–99)
Glucose-Capillary: 93 mg/dL (ref 70–99)

## 2020-09-24 MED ORDER — HYDROMORPHONE HCL 2 MG PO TABS
6.0000 mg | ORAL_TABLET | Freq: Once | ORAL | Status: AC
Start: 2020-09-24 — End: 2020-09-24
  Administered 2020-09-24: 6 mg

## 2020-09-24 MED ORDER — SORBITOL 70 % SOLN
60.0000 mL | Freq: Every day | Status: DC
  Administered 2020-09-24 – 2020-10-01 (×5): 60 mL
  Filled 2020-09-24 (×6): qty 60

## 2020-09-24 MED ORDER — HYDROMORPHONE HCL 2 MG PO TABS
8.0000 mg | ORAL_TABLET | ORAL | Status: DC
  Administered 2020-09-24 – 2020-09-26 (×11): 8 mg
  Filled 2020-09-24 (×11): qty 4

## 2020-09-24 MED ORDER — SORBITOL 70 % SOLN
960.0000 mL | TOPICAL_OIL | Freq: Once | ORAL | Status: AC
  Administered 2020-09-24: 960 mL via RECTAL
  Filled 2020-09-24: qty 473

## 2020-09-24 NOTE — Progress Notes (Signed)
17 Days Post-Op Procedure(s) (LRB): XI ROBOTIC ASSISTED THORASCOPY-DECORTICATION & REVISION OF ESOPHAGECTOMY (Right) ESOPHAGOGASTRODUODENOSCOPY (EGD) (N/A) INTERCOSTAL NERVE BLOCK (Right) Subjective: Alert, cooperative  Objective: Vital signs in last 24 hours: Temp:  [97.6 F (36.4 C)-98.4 F (36.9 C)] 98.1 F (36.7 C) (08/20 0330) Pulse Rate:  [54-77] 65 (08/20 0500) Cardiac Rhythm: Normal sinus rhythm;Sinus bradycardia (08/20 0400) Resp:  [12-30] 19 (08/20 0500) BP: (87-127)/(42-79) 93/65 (08/20 0500) SpO2:  [89 %-100 %] 95 % (08/20 0500) FiO2 (%):  [40 %] 40 % (08/20 0734)  Hemodynamic parameters for last 24 hours:    Intake/Output from previous day: 08/19 0701 - 08/20 0700 In: 2318.7 [I.V.:498.6; NG/GT:1080; IV Piggyback:650.1] Out: 1820 [Urine:1700; Chest Tube:120] Intake/Output this shift: No intake/output data recorded.  General appearance: alert, cooperative, and no distress Neurologic: intact Heart: regular rate and rhythm Lungs: rhonchi bilaterally Abdomen: soft  Lab Results: Recent Labs    09/23/20 0323 09/24/20 0500  WBC 13.7* 16.7*  HGB 8.7* 8.7*  HCT 27.3* 27.1*  PLT 746* 725*   BMET:  Recent Labs    09/23/20 0440 09/24/20 0500  NA 136 136  K 4.4 4.5  CL 103 99  CO2 28 31  GLUCOSE 121* 101*  BUN 24* 23*  CREATININE 0.39* 0.38*  CALCIUM 8.6* 8.9    PT/INR: No results for input(s): LABPROT, INR in the last 72 hours. ABG    Component Value Date/Time   PHART 7.414 09/08/2020 0444   HCO3 20.7 09/08/2020 0444   TCO2 22 09/08/2020 0444   ACIDBASEDEF 3.0 (H) 09/08/2020 0444   O2SAT 97.0 09/08/2020 0444   CBG (last 3)  Recent Labs    09/23/20 2318 09/24/20 0337 09/24/20 0741  GLUCAP 99 93 111*    Assessment/Plan: S/P Procedure(s) (LRB): XI ROBOTIC ASSISTED THORASCOPY-DECORTICATION & REVISION OF ESOPHAGECTOMY (Right) ESOPHAGOGASTRODUODENOSCOPY (EGD) (N/A) INTERCOSTAL NERVE BLOCK (Right) - CV- stable in SR  RESP- VDRF, wean in  progress, was on TC with PMV yesterday, rested overnight, on PSV now  RENAL- creatinine and lytes oK  GI- tolerating trickle feeds  ID- afebrile, WBC up slightly  Enterobacter and Prevotella in pleural fluid  On Tobramycin, meropenem, and Vancomycin  ENDO- CBG well controlled  Deconditioning- PT  LOS: 23 days    Loreli Slot 09/24/2020

## 2020-09-24 NOTE — Progress Notes (Signed)
Pt placed back on full vent support settings to rest for the night per MD order. Pt tolerating well

## 2020-09-24 NOTE — Progress Notes (Addendum)
Transported CT scan and back without complications. Pt was then placed on trach collar 10L/40%. Pt is stable and tolerating well at this time.

## 2020-09-24 NOTE — Progress Notes (Signed)
      [address]       [place] [address]             [phone]      Resting comfortably on TC  BP 116/69   Pulse (!) 56   Temp 97.7 F (36.5 C) (Oral)   Resp 18   Ht 5\' 8"  (1.727 m)   Wt 54 kg   SpO2 100%   BMI 18.10 kg/m   CT CHEST WITHOUT CONTRAST   TECHNIQUE: Multidetector CT imaging of the chest was performed following the standard protocol without IV contrast.   COMPARISON:  Chest radiograph of 1 day prior.  CT 09/19/2020   FINDINGS: Cardiovascular: Right PICC line tip mid right atrium. Aortic atherosclerosis. Normal heart size, without pericardial effusion. Lad and right coronary artery calcification.   Mediastinum/Nodes: No mediastinal or definite hilar adenopathy, given limitations of unenhanced CT. At least partial esophagectomy   Lungs/Pleura: Presumed drainage of previously described left pleural fluid collection with trace left-sided pleural fluid remaining. Similar trace right pleural fluid.   Bilateral chest tubes in place with trace left-sided pleural air.   Tracheostomy appropriately positioned. Again identified is debris/fluid above the tracheostomy and fluid within the right lower lobe endobronchial tree.   Moderate centrilobular and paraseptal emphysema.   Fluid tracking in the right major fissure.   Similar areas of right base subsegmental atelectasis. Dense consolidation in the left lower lobe remains. Extraalveolar air in the left lower lobe on 115/8 communicates with the pleural space.   Upper Abdomen: Normal imaged portions of the liver, spleen, pancreas, adrenal glands, kidneys.   Musculoskeletal: Convex right thoracic spine curvature.   IMPRESSION: 1. Presumed drainage of previously described left pleural fluid collection. Residual left-sided small volume hydropneumothorax with pleural drain in place. 2. Left lower lobe consolidation, suspicious for pneumonia. Areas of extraalveolar air with communication  to the pleural space, suggesting necrosis. 3. Trace right-sided pleural fluid with similar right base atelectasis. 4. Aortic atherosclerosis (ICD10-I70.0), coronary artery atherosclerosis and emphysema (ICD10-J43.9).     Electronically Signed   By: 09/21/2020 M.D.   On: 09/24/2020 10:01  Stable day. Appearance of CT about what you would expect in this setting.  09/26/2020 Salvatore Decent, MD [facility] [phone]

## 2020-09-24 NOTE — Progress Notes (Signed)
NAME:  Ralph [name], MRN:  [id number], DOB:  [birth year], LOS: 23 ADMISSION DATE:  09/01/2020, CONSULTATION DATE:  8/3 REFERRING MD:  Dr. Cliffton Asters, CHIEF COMPLAINT:  esophageal rupture, esophageal stricture    History of Present Illness:  Patient is a 55 yo M w/ PMH anxiety, CAD, MI, stroke presents to [facility] on 7/28 for esophagectomy w/ Dr. Cliffton Asters for esophageal stricture.  On 7/28, Dr. Cliffton Asters performed the esophagectomy without complication.  8/3 patient taken back to OR for esophageal rupture s/p esophagectomy by Dr. Cliffton Asters. Unable to extubate post procedure. Patient remains intubated on mechanical ventilation. ABG with respiratory acidosis.    PCCM consulted for vent management. He remains critically ill  Pertinent  Medical History   Past Medical History:  Diagnosis Date   Anxiety    Coronary artery disease    Myocardial infarction [facility])    Stroke (HCC)     Significant Hospital Events: Including procedures, antibiotic start and stop dates in addition to other pertinent events   7/28 admitted for planned esophagectomy. Chest tube was placed on right chest. J tube in place. 2 JP drains in place. Patient was extubated post procedure. Patient developed subcutaneous emphysema. Unable to use J tube due to leakage around site. 8/3: PCCM consulted for vent management after pt brought back to OR for esophageal rupture/ leak. Vanc/zosyn/fluconazole started. Hypotension noted post-op. Felt 2/2 Sepsis; treated w/ volume and pressors.  8/4: Still on norepinephrine.  Central venous pressure suggesting he is still hypovolemic so receiving fluid.  Spontaneous breathing trial initiated but not ready for extubation due to work of breathing.  culture sent from chest tube.  J-tube replaced in interventional radiology due to leaking.  Tube feeds resumed in the afternoon 8/5: J-tube site again leaking, tube feeds placed on hold around midnight.Still pressor dependent . Passed SBT. WOB improved.   Failed extubation and was reintubated 8/8: J-tube upsized 8/9: Tracheostomy 8/10 J-tube continues to leak. TF stopped. RT IJ CVC removed. 8/13 starting trickle feeds 8/15 bronch 8/16 pigtail to L fluid collection  Antibiotics: Cefoxitin 7/28-7/29 Fluconazole 8/3>>8/17 Gentamicin 8/3 Zosyn 8/3>8/10 Vancomycin 8/3-8/7 Ceftazidime 8/10>> Tobramycin nebs 8/10>> Flagyl 8/16 >>8/19 Vanc 8/19 >> Meropenem 8/19 >>  Interim History / Subjective:  No events, still having some breakthrough pain.  Objective   Blood pressure 93/65, pulse 65, temperature 98.1 F (36.7 C), temperature source Oral, resp. rate 19, height 5\' 8"  (1.727 m), weight 54 kg, SpO2 95 %.    Vent Mode: PSV;CPAP FiO2 (%):  [40 %] 40 % Set Rate:  [15 bmp] 15 bmp Vt Set:  [540 mL] 540 mL PEEP:  [5 cmH20] 5 cmH20 Pressure Support:  [10 cmH20] 10 cmH20 Plateau Pressure:  [16 cmH20-23 cmH20] 23 cmH20   Intake/Output Summary (Last 24 hours) at 09/24/2020 0815 Last data filed at 09/24/2020 0000 Gross per 24 hour  Intake 2138.61 ml  Output 1810 ml  Net 328.61 ml    Filed Weights   09/20/20 0500 09/21/20 0413 09/23/20 0500  Weight: 53.9 kg 53.3 kg 54 kg    Examination: Cachetic man on vent Lungs with rhonci overall improved, triggering vent Abdomen soft, R JP drain in place, L pigtail with purulent fluid, L J tube in place Muscle wasting without edema  BMP stable WBC up slightly, plts improved Afebrile  Resolved Hospital Problem list   Constipation   Assessment & Plan:   Acute respiratory failure w/ hypoxia and hypercarbia requiring mechanical ventilation. s/p tracheostomy (8/9) HAP- Pan-sensitive Pseudomonas pneumonia L  loculated effusion- with increasing thrombocythemia s/p Chest Tube (8/16), culture with prevotella, enterococcus Plan  - CT chest today, if pleural collection mostly gone will dc tube - Vanc/meropenem x 7-14 days, will discuss with primary at some point  Esophageal stricture s/p  esophagectomy complicated by post operative esophageal rupture and J-tube leakage Severe protein calorie malnutrition, predates surgery   Leaking J tube- intermittent issue throughout stay, CT a/p looks okay position.  J-tube upsized 8/8. Tube feeding stopped again on 8/11 due to leak around J tube, yellow/green drainage overnight per nursing. 8/13 restarted trickle tube feeds, but stopped due to pain. Pain did not resolve after TF cessation and TF restarted 8/14.  Constipation- issue again, last BM 8/17 Plan - Continue reglan - Continue miralax, sennosides, and docusate  - Continue TF - Add smog enema and sorbitol by tube today; relistor tomorrow if no BM  Bradycardia, QTC 390, question vagal events Plan -Cardiology Consulted >> stopped dobutamine 8/17 -Atropine PRN if prolonged    Pressure Wound - Sacrum -Continue q2h turn -Continue foam dressing  Depression- continue lexapro; continue seroquel, Qtc 8/20 407  Best Practice (right click and "Reselect all SmartList Selections" daily)   Diet/type: TF DVT prophylaxis: LMWH GI prophylaxis: PPI Lines: NG, J tube, PICC Foley:  Condom cath Code Status:  full code Last date of multidisciplinary goals of care discussion: goal is LTACH   Patient critically ill due to respiratory failure Interventions to address this today vent titration Risk of deterioration without these interventions is high  I personally spent 32 minutes providing critical care not including any separately billable procedures  Myrla Halsted MD PCCM

## 2020-09-24 NOTE — Progress Notes (Signed)
Progress Note  Patient Name: Ralph [name] Date of Encounter: 09/24/2020  Primary Cardiologist: Buford Dresser, MD  Subjective   Awake, on ventilator via trach.  Interactive and responsive to questions.  Inpatient Medications    Scheduled Meds:  chlorhexidine gluconate (MEDLINE KIT)  15 mL Mouth Rinse BID   Chlorhexidine Gluconate Cloth  6 each Topical Daily   clonazePAM  1 mg Per Tube BID   docusate  100 mg Per Tube BID   enoxaparin (LOVENOX) injection  40 mg Subcutaneous QHS   escitalopram  10 mg Per Tube QHS   feeding supplement (PROSource TF)  45 mL Per Tube TID   Gerhardt's butt cream   Topical QID   HYDROmorphone  8 mg Per Tube Q4H   insulin aspart  0-24 Units Subcutaneous Q4H   liver oil-zinc oxide   Topical BID   mouth rinse  15 mL Mouth Rinse 10 times per day   metoCLOPramide (REGLAN) injection  10 mg Intravenous Q6H   midodrine  10 mg Per J Tube TID   pantoprazole (PROTONIX) IV  40 mg Intravenous Q24H   polyethylene glycol  17 g Per Tube BID   QUEtiapine  50 mg Per Tube BID   sennosides  10 mL Per Tube BID   sodium chloride flush  10 mL Intracatheter Q8H   sodium chloride flush  10-40 mL Intracatheter Q12H   sorbitol  60 mL Per Tube Q0600   sorbitol, milk of mag, mineral oil, glycerin (SMOG) enema  960 mL Rectal Once   tobramycin (PF)  300 mg Nebulization BID   Continuous Infusions:  sodium chloride Stopped (09/22/20 0021)   feeding supplement (VITAL 1.5 CAL) 1,000 mL (09/23/20 0616)   meropenem (MERREM) IV 1 g (09/24/20 0657)   vancomycin 750 mg (09/23/20 2300)   PRN Meds: sodium chloride, acetaminophen (TYLENOL) oral liquid 160 mg/5 mL, albuterol, atropine, HYDROmorphone (DILAUDID) injection, sodium chloride flush   Vital Signs    Vitals:   09/24/20 0400 09/24/20 0500 09/24/20 0800 09/24/20 0827  BP: (!) 87/42 93/65 114/70   Pulse: 65 65 60   Resp: 12 19 (!) 24   Temp:    98.1 F (36.7 C)  TempSrc:    Oral  SpO2: 99% 95% 100%   Weight:       Height:        Intake/Output Summary (Last 24 hours) at 09/24/2020 0908 Last data filed at 09/24/2020 0000 Gross per 24 hour  Intake 2028.54 ml  Output 1810 ml  Net 218.54 ml   Filed Weights   09/20/20 0500 09/21/20 0413 09/23/20 0500  Weight: 53.9 kg 53.3 kg 54 kg    Telemetry    Sinus rhythm.  Transient high degree heart block with ventricular escape noted overnight, shorter events as well with some junctional escape.  Personally reviewed.  ECG    An ECG dated 09/24/2020 was personally reviewed today and demonstrated:  Sinus arrhythmia with increased voltage.  Physical Exam   GEN: No acute distress.   Neck: No JVD. Cardiac: RRR, no gallop.  Respiratory: Nonlabored.  Breathing comfortably on ventilator via trach tube. GI: Soft, nontender, bowel sounds present. MS: No edema.  Labs    Chemistry Recent Labs  Lab 09/18/20 0318 09/19/20 0444 09/20/20 0356 09/22/20 0223 09/23/20 0440 09/24/20 0500  NA 139 137   < > 137 136 136  K 3.7 4.3   < > 4.6 4.4 4.5  CL 99 103   < > 106 103  99  CO2 30 27   < > _0 GLUCOSE 128* 132*   < > 103* 121* 101*  BUN 24* 26*   < > 24* 24* 23*  CREATININE 0.57* 0.46*   < > 0.37* 0.39* 0.38*  CALCIUM 8.9 8.6*   < > 8.6* 8.6* 8.9  PROT 6.0* 5.9*  --  5.9*  --   --   ALBUMIN 2.0* 2.0*  --  1.9*  --   --   AST 35 47*  --  48*  --   --   ALT 43 50*  --  61*  --   --   ALKPHOS 117 131*  --  146*  --   --   BILITOT 1.2 1.0  --  0.6  --   --   GFRNONAA >60 >60   < > >60 >60 >60  ANIONGAP 10 7   < > 4* 5 6   < > = values in this interval not displayed.     Hematology Recent Labs  Lab 09/22/20 0223 09/23/20 0323 09/24/20 0500  WBC 15.0* 13.7* 16.7*  RBC 2.85* 2.82* 2.92*  HGB 8.4* 8.7* 8.7*  HCT 26.4* 27.3* 27.1*  MCV 92.6 96.8 92.8  MCH 29.5 30.9 29.8  MCHC 31.8 31.9 32.1  RDW 15.2 15.7* 15.3  PLT 708* 746* 725*    Radiology    DG Chest 1 View  Result Date: 09/23/2020 CLINICAL DATA:  Pleural effusion EXAM: CHEST   1 VIEW COMPARISON:  09/20/2020 FINDINGS: Tracheostomy tube, right-sided PICC line, right chest tubes, and left pigtail pleural drainage catheter remain in place. Normal heart size. Persistent small bilateral pleural effusions with associated atelectasis/consolidations. Increasing streaky opacity within the right mid to lower lung field. No pneumothorax. IMPRESSION: 1. Increasing streaky opacity within the right mid to lower lung field, which may represent atelectasis versus infection. 2. Stable lines and tubes, as above. Electronically Signed   By: Davina Poke D.O.   On: 09/23/2020 10:46   ECHOCARDIOGRAM COMPLETE  Result Date: 09/22/2020    ECHOCARDIOGRAM REPORT   Patient Name:   Ralph [name] [facility] Date of Exam: 09/22/2020 Medical Rec #:  [id number]    Height:       68.0 in Accession #:    [id number]   Weight:       117.5 lb Date of Birth:  [birth year]    BSA:          1.630 m Patient Age:    20 years     BP:           105/73 mmHg Patient Gender: M            HR:           60 bpm. Exam Location:  Inpatient Procedure: 2D Echo, Cardiac Doppler and Color Doppler Indications:    Ventricular Tachycardia I47.2  History:        Patient has prior history of Echocardiogram examinations, most                 recent 04/30/2019. CAD; Risk Factors:Hypertension and                 Dyslipidemia.  Sonographer:    Merrie Roof RDCS Referring Phys: [id number] [place]  1. Left ventricular ejection fraction, by estimation, is 60 to 65%. The left ventricle has normal function. The left ventricle has no regional wall motion abnormalities. There is mild left ventricular hypertrophy. Left ventricular diastolic  parameters were normal.  2. Right ventricular systolic function is normal. The right ventricular size is normal. Tricuspid regurgitation signal is inadequate for assessing PA pressure.  3. The mitral valve is normal in structure. No evidence of mitral valve regurgitation. No evidence of mitral stenosis.  4. The  aortic valve was not well visualized. Aortic valve regurgitation is not visualized. No aortic stenosis is present. FINDINGS  Left Ventricle: Left ventricular ejection fraction, by estimation, is 60 to 65%. The left ventricle has normal function. The left ventricle has no regional wall motion abnormalities. The left ventricular internal cavity size was normal in size. There is  mild left ventricular hypertrophy. Left ventricular diastolic parameters were normal. Right Ventricle: The right ventricular size is normal. No increase in right ventricular wall thickness. Right ventricular systolic function is normal. Tricuspid regurgitation signal is inadequate for assessing PA pressure. Left Atrium: Left atrial size was normal in size. Right Atrium: Right atrial size was normal in size. Pericardium: There is no evidence of pericardial effusion. Mitral Valve: The mitral valve is normal in structure. No evidence of mitral valve regurgitation. No evidence of mitral valve stenosis. Tricuspid Valve: The tricuspid valve is normal in structure. Tricuspid valve regurgitation is not demonstrated. Aortic Valve: The aortic valve was not well visualized. Aortic valve regurgitation is not visualized. No aortic stenosis is present. Aortic valve mean gradient measures 4.0 mmHg. Aortic valve peak gradient measures 8.2 mmHg. Aortic valve area, by VTI measures 2.37 cm. Pulmonic Valve: The pulmonic valve was not well visualized. Pulmonic valve regurgitation is not visualized. Aorta: The aortic root is normal in size and structure. IAS/Shunts: The interatrial septum was not well visualized.  LEFT VENTRICLE PLAX 2D LVIDd:         4.40 cm  Diastology LVIDs:         2.80 cm  LV e' medial:    6.83 cm/s LV PW:         1.20 cm  LV E/e' medial:  10.4 LV IVS:        1.40 cm  LV e' lateral:   10.00 cm/s LVOT diam:     1.80 cm  LV E/e' lateral: 7.1 LV SV:         66 LV SV Index:   40 LVOT Area:     2.54 cm  RIGHT VENTRICLE             IVC RV Basal  diam:  2.90 cm     IVC diam: 2.10 cm RV S prime:     15.20 cm/s TAPSE (M-mode): 2.0 cm LEFT ATRIUM             Index       RIGHT ATRIUM           Index LA diam:        3.10 cm 1.90 cm/m  RA Area:     11.60 cm LA Vol (A2C):   57.2 ml 35.09 ml/m RA Volume:   23.50 ml  14.41 ml/m LA Vol (A4C):   28.2 ml 17.30 ml/m LA Biplane Vol: 41.2 ml 25.27 ml/m  AORTIC VALVE AV Area (Vmax):    2.54 cm AV Area (Vmean):   2.33 cm AV Area (VTI):     2.37 cm AV Vmax:           143.00 cm/s AV Vmean:          95.100 cm/s AV VTI:  0.278 m AV Peak Grad:      8.2 mmHg AV Mean Grad:      4.0 mmHg LVOT Vmax:         143.00 cm/s LVOT Vmean:        86.900 cm/s LVOT VTI:          0.259 m LVOT/AV VTI ratio: 0.93  AORTA Ao Root diam: 3.60 cm MITRAL VALVE MV Area (PHT): 3.08 cm    SHUNTS MV Decel Time: 246 msec    Systemic VTI:  0.26 m MV E velocity: 71.30 cm/s  Systemic Diam: 1.80 cm MV A velocity: 68.10 cm/s MV E/A ratio:  1.05 Oswaldo Milian MD Electronically signed by Oswaldo Milian MD Signature Date/Time: 09/22/2020/3:51:07 PM    Final      Assessment & Plan    1.  Sinus bradycardia and transient high degree heart block.  Potential vagally mediated component and not overly symptomatic during episodes.  Did have episode of high degree heart block with ventricular escape overnight, heart rate in the 30s.  He is not on any AV nodal blockers.  Cardiac rhythm is normal this morning.  LVEF normal by echocardiogram.  2.  Status post esophagectomy complicated by postoperative esophageal rupture requiring revision.  3.  Acute hypoxic/hypercarbic respiratory failure requiring mechanical ventilation and tracheostomy.  He has an associated Pseudomonas pneumonia and loculated left pleural effusion with chest tube and on broad-spectrum antibiotics per CCM.  Continue observation from a cardiac perspective.  Have atropine available if needed.  Hopefully as he further stabilizes in terms of comorbid illness, so will  his rhythm.  If episodes of high degree heart block continue, may need EP consultation, although he is not an optimal candidate for pacemaker given high device infection risk at this time.  Signed, Rozann Lesches, MD  09/24/2020, 9:08 AM

## 2020-09-24 NOTE — Plan of Care (Signed)
  Problem: Bowel/Gastric: Goal: Gastrointestinal status for postoperative course will improve Outcome: Progressing   Problem: Nutritional: Goal: Ability to achieve adequate nutritional intake will improve Outcome: Progressing   Problem: Respiratory: Goal: Ability to maintain a clear airway will improve Outcome: Progressing   Problem: Skin Integrity: Goal: Demonstration of wound healing without infection will improve Outcome: Progressing

## 2020-09-25 DIAGNOSIS — I442 Atrioventricular block, complete: Secondary | ICD-10-CM

## 2020-09-25 LAB — GLUCOSE, CAPILLARY
Glucose-Capillary: 134 mg/dL — ABNORMAL HIGH (ref 70–99)
Glucose-Capillary: 103 mg/dL — ABNORMAL HIGH (ref 70–99)
Glucose-Capillary: 80 mg/dL (ref 70–99)
Glucose-Capillary: 96 mg/dL (ref 70–99)
Glucose-Capillary: 97 mg/dL (ref 70–99)

## 2020-09-25 LAB — CBC
HCT: 31 % — ABNORMAL LOW (ref 39.0–52.0)
Hemoglobin: 9.9 g/dL — ABNORMAL LOW (ref 13.0–17.0)
MCH: 29.8 pg (ref 26.0–34.0)
MCHC: 31.9 g/dL (ref 30.0–36.0)
MCV: 93.4 fL (ref 80.0–100.0)
Platelets: 775 10*3/uL — ABNORMAL HIGH (ref 150–400)
RBC: 3.32 MIL/uL — ABNORMAL LOW (ref 4.22–5.81)
RDW: 15.5 % (ref 11.5–15.5)
WBC: 21.1 10*3/uL — ABNORMAL HIGH (ref 4.0–10.5)
nRBC: 0 % (ref 0.0–0.2)

## 2020-09-25 LAB — BASIC METABOLIC PANEL
Anion gap: 6 (ref 5–15)
BUN: 17 mg/dL (ref 6–20)
CO2: 28 mmol/L (ref 22–32)
Calcium: 8.8 mg/dL — ABNORMAL LOW (ref 8.9–10.3)
Chloride: 102 mmol/L (ref 98–111)
Creatinine, Ser: 0.35 mg/dL — ABNORMAL LOW (ref 0.61–1.24)
GFR, Estimated: >60 mL/min (ref >60)
Glucose, Bld: 127 mg/dL — ABNORMAL HIGH (ref 70–99)
Potassium: 4.2 mmol/L (ref 3.5–5.1)
Sodium: 136 mmol/L (ref 135–145)

## 2020-09-25 LAB — VANCOMYCIN, TROUGH: Vancomycin Tr: 7 ug/mL — ABNORMAL LOW (ref 15–20)

## 2020-09-25 MED ORDER — VANCOMYCIN HCL 1250 MG/250ML IV SOLN
1250.0000 mg | Freq: Three times a day (TID) | INTRAVENOUS | Status: DC
  Administered 2020-09-25 – 2020-09-26 (×4): 1250 mg via INTRAVENOUS
  Filled 2020-09-25 (×6): qty 250

## 2020-09-25 MED ORDER — MELATONIN 5 MG PO TABS
10.0000 mg | ORAL_TABLET | Freq: Every day | ORAL | Status: DC
  Administered 2020-09-25 – 2020-10-24 (×30): 10 mg
  Filled 2020-09-25 (×30): qty 2

## 2020-09-25 NOTE — Progress Notes (Signed)
NAME:  BRIAN ZEITLIN, MRN:  [id number], DOB:  Jul 05, 1965, LOS: 24 ADMISSION DATE:  09/01/2020, CONSULTATION DATE:  8/3 REFERRING MD:  Dr. Cliffton Asters, CHIEF COMPLAINT:  esophageal rupture, esophageal stricture    History of Present Illness:  Patient is a 55 yo M w/ PMH anxiety, CAD, MI, stroke presents to [facility] on 7/28 for esophagectomy w/ Dr. Cliffton Asters for esophageal stricture.  On 7/28, Dr. Cliffton Asters performed the esophagectomy without complication.  8/3 patient taken back to OR for esophageal rupture s/p esophagectomy by Dr. Cliffton Asters. Unable to extubate post procedure. Patient remains intubated on mechanical ventilation. ABG with respiratory acidosis.    PCCM consulted for vent management. He remains critically ill  Pertinent  Medical History   Past Medical History:  Diagnosis Date   Anxiety    Coronary artery disease    Myocardial infarction [facility])    Stroke (HCC)     Significant Hospital Events: Including procedures, antibiotic start and stop dates in addition to other pertinent events   7/28 admitted for planned esophagectomy. Chest tube was placed on right chest. J tube in place. 2 JP drains in place. Patient was extubated post procedure. Patient developed subcutaneous emphysema. Unable to use J tube due to leakage around site. 8/3: PCCM consulted for vent management after pt brought back to OR for esophageal rupture/ leak. Vanc/zosyn/fluconazole started. Hypotension noted post-op. Felt 2/2 Sepsis; treated w/ volume and pressors.  8/4: Still on norepinephrine.  Central venous pressure suggesting he is still hypovolemic so receiving fluid.  Spontaneous breathing trial initiated but not ready for extubation due to work of breathing.  culture sent from chest tube.  J-tube replaced in interventional radiology due to leaking.  Tube feeds resumed in the afternoon 8/5: J-tube site again leaking, tube feeds placed on hold around midnight.Still pressor dependent . Passed SBT. WOB improved.   Failed extubation and was reintubated 8/8: J-tube upsized 8/9: Tracheostomy 8/10 J-tube continues to leak. TF stopped. RT IJ CVC removed. 8/13 starting trickle feeds 8/15 bronch 8/16 pigtail to L fluid collection  Antibiotics: Cefoxitin 7/28-7/29 Fluconazole 8/3>>8/17 Gentamicin 8/3 Zosyn 8/3>8/10 Vancomycin 8/3-8/7 Ceftazidime 8/10>> Tobramycin nebs 8/10>> Flagyl 8/16 >>8/19 Vanc 8/19 >> Meropenem 8/19 >>  Interim History / Subjective:  Did great with TC yesterday. Going to try around the clock today.  Objective   Blood pressure 93/64, pulse (!) 59, temperature 98.5 F (36.9 C), temperature source Oral, resp. rate 19, height 5\' 8"  (1.727 m), weight 53.9 kg, SpO2 97 %.    Vent Mode: PRVC FiO2 (%):  [30 %-40 %] 35 % Set Rate:  [15 bmp] 15 bmp Vt Set:  [540 mL] 540 mL PEEP:  [5 cmH20] 5 cmH20 Plateau Pressure:  [20 cmH20] 20 cmH20   Intake/Output Summary (Last 24 hours) at 09/25/2020 0746 Last data filed at 09/25/2020 0700 Gross per 24 hour  Intake 1680 ml  Output 2150 ml  Net -470 ml    Filed Weights   09/21/20 0413 09/23/20 0500 09/25/20 0427  Weight: 53.3 kg 54 kg 53.9 kg    Examination: Cachetic man on vent Lungs with rhonci overall improved, triggering vent Abdomen soft, R JP drain in place, L pigtail out, has JP on R draining esophagectomy site Muscle wasting without edema  WBC up Plts up   Resolved Hospital Problem list   Constipation   Assessment & Plan:   Acute respiratory failure w/ hypoxia and hypercarbia requiring mechanical ventilation. s/p tracheostomy (8/9) HAP- Pan-sensitive Pseudomonas pneumonia L loculated effusion, pneumonia with  necrosis-  s/p Chest Tube (8/16), culture with prevotella, enterococcus; pigtail out 8/20 Rising white count Plan  - Not sure what to make of rising white count, afebrile, sources seem controlled, keep an eye on for now, continue broad spectrum abx; if keeps rising may need abd CT and/or line holiday -  Vanc/meropenem x 10-14 days, will discuss with primary at some point - Trial of 24/7 trach collar  Esophageal stricture s/p esophagectomy complicated by post operative esophageal rupture and J-tube leakage Severe protein calorie malnutrition, predates surgery   Leaking J tube- intermittent issue throughout stay, CT a/p looks okay position.  J-tube upsized 8/8. Tube feeding stopped again on 8/11 due to leak around J tube, yellow/green drainage overnight per nursing. 8/13 restarted trickle tube feeds, but stopped due to pain. Pain did not resolve after TF cessation and TF restarted 8/14.  Constipation- improved, large BM 8/20 Plan - Continue reglan - Continue miralax, sennosides, sorbitol, and docusate  - Continue TF - JP management per TCTS  Bradycardia, QTC 390, question vagal events Plan -Cardiology Consulted >> stopped dobutamine 8/17 -Atropine PRN if prolonged    Pressure Wound - Sacrum -Continue q2h turn -Continue foam dressing  Depression- continue lexapro; continue seroquel, Qtc 8/20 407  Best Practice (right click and "Reselect all SmartList Selections" daily)   Diet/type: TF DVT prophylaxis: LMWH GI prophylaxis: PPI Lines: NG, J tube, PICC Foley:  Condom cath Code Status:  full code Last date of multidisciplinary goals of care discussion: goal is LTACH  Myrla Halsted MD PCCM

## 2020-09-25 NOTE — Progress Notes (Signed)
Progress Note  Patient Name: Ralph [name] Date of Encounter: 09/25/2020  Primary Cardiologist: Buford Dresser, MD  Subjective   Awake, no specific concerns other than difficulty sleeping.  On ventilator via trach.  Inpatient Medications    Scheduled Meds:  chlorhexidine gluconate (MEDLINE KIT)  15 mL Mouth Rinse BID   Chlorhexidine Gluconate Cloth  6 each Topical Daily   clonazePAM  1 mg Per Tube BID   docusate  100 mg Per Tube BID   enoxaparin (LOVENOX) injection  40 mg Subcutaneous QHS   escitalopram  10 mg Per Tube QHS   feeding supplement (PROSource TF)  45 mL Per Tube TID   Gerhardt's butt cream   Topical QID   HYDROmorphone  8 mg Per Tube Q4H   insulin aspart  0-24 Units Subcutaneous Q4H   liver oil-zinc oxide   Topical BID   mouth rinse  15 mL Mouth Rinse 10 times per day   melatonin  10 mg Per Tube QHS   metoCLOPramide (REGLAN) injection  10 mg Intravenous Q6H   midodrine  10 mg Per J Tube TID   pantoprazole (PROTONIX) IV  40 mg Intravenous Q24H   polyethylene glycol  17 g Per Tube BID   QUEtiapine  50 mg Per Tube BID   sennosides  10 mL Per Tube BID   sodium chloride flush  10 mL Intracatheter Q8H   sodium chloride flush  10-40 mL Intracatheter Q12H   sorbitol  60 mL Per Tube Q0600   tobramycin (PF)  300 mg Nebulization BID   Continuous Infusions:  sodium chloride 10 mL/hr at 09/25/20 0700   feeding supplement (VITAL 1.5 CAL) 60 mL/hr at 09/25/20 0700   meropenem (MERREM) IV 1 g (09/25/20 4097)   vancomycin 750 mg (09/24/20 2217)   PRN Meds: sodium chloride, acetaminophen (TYLENOL) oral liquid 160 mg/5 mL, albuterol, atropine, HYDROmorphone (DILAUDID) injection, sodium chloride flush   Vital Signs    Vitals:   09/25/20 0600 09/25/20 0700 09/25/20 0719 09/25/20 0725  BP: 139/63 93/64 93/64    Pulse: (!) 59 (!) 59    Resp: 18 19    Temp:    98.5 F (36.9 C)  TempSrc:    Oral  SpO2: 99% 97%    Weight:      Height:        Intake/Output  Summary (Last 24 hours) at 09/25/2020 0817 Last data filed at 09/25/2020 0700 Gross per 24 hour  Intake 1680 ml  Output 2150 ml  Net -470 ml   Filed Weights   09/21/20 0413 09/23/20 0500 09/25/20 0427  Weight: 53.3 kg 54 kg 53.9 kg    Telemetry    Largely sinus rhythm with a few episodes of transient high degree heart block and junctional escape that were not obviously symptomatic.  Personally reviewed.  ECG    An ECG dated 09/24/2020 was personally reviewed today and demonstrated:  Sinus arrhythmia with increased voltage.  Physical Exam   GEN: No acute distress.   Neck: No JVD. Cardiac: RRR, no gallop.  Respiratory: Nonlabored.  Breathing comfortably on ventilator via trach tube. GI: Soft, nontender, bowel sounds present. MS: No edema.  Labs    Chemistry Recent Labs  Lab 09/19/20 0444 09/20/20 0356 09/22/20 0223 09/23/20 0440 09/24/20 0500 09/25/20 0423  NA 137   < > 137 136 136 136  K 4.3   < > 4.6 4.4 4.5 4.2  CL 103   < > 106 103 99 102  CO2 27   < > 27 28 31 28   GLUCOSE 132*   < > 103* 121* 101* 127*  BUN 26*   < > 24* 24* 23* 17  CREATININE 0.46*   < > 0.37* 0.39* 0.38* 0.35*  CALCIUM 8.6*   < > 8.6* 8.6* 8.9 8.8*  PROT 5.9*  --  5.9*  --   --   --   ALBUMIN 2.0*  --  1.9*  --   --   --   AST 47*  --  48*  --   --   --   ALT 50*  --  61*  --   --   --   ALKPHOS 131*  --  146*  --   --   --   BILITOT 1.0  --  0.6  --   --   --   GFRNONAA >60   < > >60 >60 >60 >60  ANIONGAP 7   < > 4* 5 6 6    < > = values in this interval not displayed.     Hematology Recent Labs  Lab 09/23/20 0323 09/24/20 0500 09/25/20 0423  WBC 13.7* 16.7* 21.1*  RBC 2.82* 2.92* 3.32*  HGB 8.7* 8.7* 9.9*  HCT 27.3* 27.1* 31.0*  MCV 96.8 92.8 93.4  MCH 30.9 29.8 29.8  MCHC 31.9 32.1 31.9  RDW 15.7* 15.3 15.5  PLT 746* 725* 775*    Radiology    CT CHEST WO CONTRAST  Result Date: 09/24/2020 CLINICAL DATA:  Follow-up of empyema.  Prior esophagectomy. EXAM: CT CHEST  WITHOUT CONTRAST TECHNIQUE: Multidetector CT imaging of the chest was performed following the standard protocol without IV contrast. COMPARISON:  Chest radiograph of 1 day prior.  CT 09/19/2020 FINDINGS: Cardiovascular: Right PICC line tip mid right atrium. Aortic atherosclerosis. Normal heart size, without pericardial effusion. Lad and right coronary artery calcification. Mediastinum/Nodes: No mediastinal or definite hilar adenopathy, given limitations of unenhanced CT. At least partial esophagectomy Lungs/Pleura: Presumed drainage of previously described left pleural fluid collection with trace left-sided pleural fluid remaining. Similar trace right pleural fluid. Bilateral chest tubes in place with trace left-sided pleural air. Tracheostomy appropriately positioned. Again identified is debris/fluid above the tracheostomy and fluid within the right lower lobe endobronchial tree. Moderate centrilobular and paraseptal emphysema. Fluid tracking in the right major fissure. Similar areas of right base subsegmental atelectasis. Dense consolidation in the left lower lobe remains. Extraalveolar air in the left lower lobe on 115/8 communicates with the pleural space. Upper Abdomen: Normal imaged portions of the liver, spleen, pancreas, adrenal glands, kidneys. Musculoskeletal: Convex right thoracic spine curvature. IMPRESSION: 1. Presumed drainage of previously described left pleural fluid collection. Residual left-sided small volume hydropneumothorax with pleural drain in place. 2. Left lower lobe consolidation, suspicious for pneumonia. Areas of extraalveolar air with communication to the pleural space, suggesting necrosis. 3. Trace right-sided pleural fluid with similar right base atelectasis. 4. Aortic atherosclerosis (ICD10-I70.0), coronary artery atherosclerosis and emphysema (ICD10-J43.9). Electronically Signed   By: Abigail Miyamoto M.D.   On: 09/24/2020 10:01     Assessment & Plan    1.  Sinus bradycardia and  transient high degree heart block.  Potential vagally mediated component and not overly symptomatic during episodes.  Continues to have brief, limited episodes not requiring specific intervention.  He is not on any AV nodal blockers.  LVEF normal by echocardiogram.  2.  Status post esophagectomy complicated by postoperative esophageal rupture requiring revision.  3.  Acute hypoxic/hypercarbic respiratory  failure requiring mechanical ventilation and tracheostomy.  He has an associated Pseudomonas pneumonia and loculated left pleural effusion with chest tube and on broad-spectrum antibiotics per CCM.  Continue observation.  No clear indication for pacemaker at this time.  If episodes continue or become symptomatic/prolonged, would ask for EP opinion, although placement of pacemaker at this time would carry high infection risk.  Signed, Rozann Lesches, MD  09/25/2020, 8:17 AM

## 2020-09-25 NOTE — TOC Progression Note (Signed)
Transition of Care [facility]) - Progression Note    Patient Details  Name: Ralph [name] MRN: [id number] Date of Birth: Oct 03, 1965  Transition of Care [facility]) CM/SW Contact  Leanah Kolander [place], [place] Phone Number:[phone] 09/25/2020, 10:12 AM  Clinical Narrative:    CSW left voice mail with Debroah Loop with [facility] to confirm that she received the  clinicals for patient and  to check status of the referral for placement. CSW will continue to follow and assist with dc planning needs.   Delton Stelle, LCSW Transition of Care [phone]    Expected Discharge Plan:  (Vent/SNF) Barriers to Discharge: Continued Medical Work up  Expected Discharge Plan and Services Expected Discharge Plan:  (Vent/SNF) In-house Referral: Clinical Social Work     Living arrangements for the past 2 months: Single Family Home                                       Social Determinants of Health (SDOH) Interventions    Readmission Risk Interventions No flowsheet data found.

## 2020-09-25 NOTE — Plan of Care (Signed)
  Problem: Clinical Measurements: Goal: Postoperative complications will be avoided or minimized Outcome: Progressing   Problem: Respiratory: Goal: Ability to maintain a clear airway will improve Outcome: Progressing   Problem: Skin Integrity: Goal: Demonstration of wound healing without infection will improve Outcome: Progressing

## 2020-09-25 NOTE — Progress Notes (Signed)
18 Days Post-Op Procedure(s) (LRB): XI ROBOTIC ASSISTED THORASCOPY-DECORTICATION & REVISION OF ESOPHAGECTOMY (Right) ESOPHAGOGASTRODUODENOSCOPY (EGD) (N/A) INTERCOSTAL NERVE BLOCK (Right) Subjective: On TC, alert  Objective: Vital signs in last 24 hours: Temp:  [97.7 F (36.5 C)-98.6 F (37 C)] 98.5 F (36.9 C) (08/21 0725) Pulse Rate:  [46-75] 59 (08/21 0700) Cardiac Rhythm: Normal sinus rhythm (08/21 0400) Resp:  [15-33] 19 (08/21 0700) BP: (84-145)/(41-99) 93/64 (08/21 0719) SpO2:  [93 %-100 %] 97 % (08/21 0700) FiO2 (%):  [30 %-40 %] 35 % (08/21 0736) Weight:  [53.9 kg] 53.9 kg (08/21 0427)  Hemodynamic parameters for last 24 hours:    Intake/Output from previous day: 08/20 0701 - 08/21 0700 In: 1680 [I.V.:130; NG/GT:780; IV Piggyback:350] Out: 2150 [Urine:2150] Intake/Output this shift: No intake/output data recorded.  General appearance: alert, cooperative, and no distress Neurologic: intact Heart: regular rate and rhythm Lungs: coarse BS bilaterally  Lab Results: Recent Labs    09/24/20 0500 09/25/20 0423  WBC 16.7* 21.1*  HGB 8.7* 9.9*  HCT 27.1* 31.0*  PLT 725* 775*   BMET:  Recent Labs    09/24/20 0500 09/25/20 0423  NA 136 136  K 4.5 4.2  CL 99 102  CO2 31 28  GLUCOSE 101* 127*  BUN 23* 17  CREATININE 0.38* 0.35*  CALCIUM 8.9 8.8*    PT/INR: No results for input(s): LABPROT, INR in the last 72 hours. ABG    Component Value Date/Time   PHART 7.414 09/08/2020 0444   HCO3 20.7 09/08/2020 0444   TCO2 22 09/08/2020 0444   ACIDBASEDEF 3.0 (H) 09/08/2020 0444   O2SAT 97.0 09/08/2020 0444   CBG (last 3)  Recent Labs    09/24/20 1932 09/24/20 2339 09/25/20 0351  GLUCAP 115* 111* 134*    Assessment/Plan: S/P Procedure(s) (LRB): XI ROBOTIC ASSISTED THORASCOPY-DECORTICATION & REVISION OF ESOPHAGECTOMY (Right) ESOPHAGOGASTRODUODENOSCOPY (EGD) (N/A) INTERCOSTAL NERVE BLOCK (Right) NEURO- intact CV- stable RESP- VDRF, did well with TC  yesterday, rested on vent overnight  For trial of TC overnight today RENAL- lytes oK ENDO- CBG well controlled GI/ Nutrition- on TF ID- on vanco and meropenem, WBC up to 21K, no fevers Left pleural drain removed yesterday Deconditioning- PT    LOS: 24 days    Loreli Slot 09/25/2020

## 2020-09-25 NOTE — Progress Notes (Signed)
      [address]       [place] [address]             [phone]      BP 104/68   Pulse 66   Temp 97.7 F (36.5 C) (Oral)   Resp 18   Ht 5\' 8"  (1.727 m)   Wt 53.9 kg   SpO2 98%   BMI 18.07 kg/m   Intake/Output Summary (Last 24 hours) at 09/25/2020 1821 Last data filed at 09/25/2020 1700 Gross per 24 hour  Intake 2496.37 ml  Output 1000 ml  Net 1496.37 ml   Stable day, tolerating TC  Mariyana Fulop C. 09/27/2020, MD [facility] [phone]

## 2020-09-25 NOTE — Progress Notes (Signed)
Pharmacy Antibiotic Note  Ralph [name] is a 55 y.o. male admitted on 09/01/2020 with esophageal rupture, s/p surgery and multi-organisms growing in pleural fluid Cx. Currently afebrile, wbc 13 Cr 0.4.   Pharmacy has been consulted for Vancomycin and Meropenem dosing.  Vancomycin trough low this morning at 7 on 779m q12 hours.  Will increase to 12563mq8 hours Expected AUC 542  Plan: Meropenem 1gm IV q8h Increase Vancomycin to 125069m8 hours, will plan on rechecking trough in the next 24 hours to confirm  Height: _0  (172.7 cm) Weight: 53.9 kg (118 lb 13.3 oz) IBW/kg (Calculated) : 68.4  Temp (24hrs), Avg:98.1 F (36.7 C), Min:97.7 F (36.5 C), Max:98.6 F (37 C)  Recent Labs  Lab 09/21/20 0415 09/22/20 0223 09/23/20 0323 09/23/20 0440 09/24/20 0500 09/25/20 0423  WBC 18.1* 15.0* 13.7*  --  16.7* 21.1*  CREATININE 0.46* 0.37*  --  0.39* 0.38* 0.35*     Estimated Creatinine Clearance: 82.3 mL/min (A) (by C-G formula based on SCr of 0.35 mg/dL (L)).    No Known Allergies  Antimicrobials this admission: Meropenem 8/19>  Vancomycin 8/3 > 8/8, 8/19> Cefoxitin 7/28>7/29 Zosyn 8/3 > 8/10 Fluconazole 8/3 > 8/12 Ceftazidime 8/10 >> 8/19 Tobi inhaled 8/10>>8/27 Metronidazole 8/16>>8/19  Dose adjustments this admission: 8/21 VT 7 - increase to 1250m56m hours  Microbiology results: 8/16 Pleural fluid cx: abundant GNR>enterococcus BL+ (S to amp/vanc), prevotella  8/15 BAL: no orgs seen 8/14 Bcx: ng 8/8 Trach aspirate: abundant pseudomonas (pan-sen) 8/4 R pleural fluid > ecoli pan sens, pesuedomonas, streptococcus grp F - pan sens 8/3 Fungal Cx > sent    FranErin HearingrmD., BCPS Clinical Pharmacist 09/25/2020 9:13 AM

## 2020-09-26 ENCOUNTER — Inpatient Hospital Stay (HOSPITAL_COMMUNITY): Payer: Medicaid Other

## 2020-09-26 DIAGNOSIS — I469 Cardiac arrest, cause unspecified: Secondary | ICD-10-CM | POA: Diagnosis not present

## 2020-09-26 DIAGNOSIS — I442 Atrioventricular block, complete: Secondary | ICD-10-CM | POA: Diagnosis not present

## 2020-09-26 DIAGNOSIS — Z978 Presence of other specified devices: Secondary | ICD-10-CM | POA: Diagnosis not present

## 2020-09-26 DIAGNOSIS — K222 Esophageal obstruction: Secondary | ICD-10-CM | POA: Diagnosis not present

## 2020-09-26 DIAGNOSIS — K9413 Enterostomy malfunction: Secondary | ICD-10-CM | POA: Diagnosis not present

## 2020-09-26 DIAGNOSIS — Z9689 Presence of other specified functional implants: Secondary | ICD-10-CM | POA: Diagnosis not present

## 2020-09-26 LAB — HEPATIC FUNCTION PANEL
ALT: 35 U/L (ref 0–44)
AST: 25 U/L (ref 15–41)
Albumin: 2.2 g/dL — ABNORMAL LOW (ref 3.5–5.0)
Alkaline Phosphatase: 98 U/L (ref 38–126)
Bilirubin, Direct: 0.2 mg/dL (ref 0.0–0.2)
Indirect Bilirubin: 0.4 mg/dL (ref 0.3–0.9)
Total Bilirubin: 0.6 mg/dL (ref 0.3–1.2)
Total Protein: 6.1 g/dL — ABNORMAL LOW (ref 6.5–8.1)

## 2020-09-26 LAB — CBC
HCT: 32.3 % — ABNORMAL LOW (ref 39.0–52.0)
Hemoglobin: 10.1 g/dL — ABNORMAL LOW (ref 13.0–17.0)
MCH: 30 pg (ref 26.0–34.0)
MCHC: 31.3 g/dL (ref 30.0–36.0)
MCV: 95.8 fL (ref 80.0–100.0)
Platelets: 726 10*3/uL — ABNORMAL HIGH (ref 150–400)
RBC: 3.37 MIL/uL — ABNORMAL LOW (ref 4.22–5.81)
RDW: 15.9 % — ABNORMAL HIGH (ref 11.5–15.5)
WBC: 20.6 10*3/uL — ABNORMAL HIGH (ref 4.0–10.5)
nRBC: 0 % (ref 0.0–0.2)

## 2020-09-26 LAB — GLUCOSE, CAPILLARY
Glucose-Capillary: 99 mg/dL (ref 70–99)
Glucose-Capillary: 107 mg/dL — ABNORMAL HIGH (ref 70–99)
Glucose-Capillary: 90 mg/dL (ref 70–99)
Glucose-Capillary: 54 mg/dL — ABNORMAL LOW (ref 70–99)
Glucose-Capillary: 79 mg/dL (ref 70–99)

## 2020-09-26 LAB — BASIC METABOLIC PANEL
Anion gap: 5 (ref 5–15)
BUN: 14 mg/dL (ref 6–20)
CO2: 29 mmol/L (ref 22–32)
Calcium: 9 mg/dL (ref 8.9–10.3)
Chloride: 101 mmol/L (ref 98–111)
Creatinine, Ser: 0.41 mg/dL — ABNORMAL LOW (ref 0.61–1.24)
GFR, Estimated: >60 mL/min (ref >60)
Glucose, Bld: 117 mg/dL — ABNORMAL HIGH (ref 70–99)
Potassium: 4.4 mmol/L (ref 3.5–5.1)
Sodium: 135 mmol/L (ref 135–145)

## 2020-09-26 LAB — VANCOMYCIN, TROUGH: Vancomycin Tr: 25 ug/mL (ref 15–20)

## 2020-09-26 MED ORDER — VANCOMYCIN HCL 750 MG/150ML IV SOLN
750.0000 mg | Freq: Three times a day (TID) | INTRAVENOUS | Status: AC
Start: 2020-09-27 — End: 2020-09-29
  Administered 2020-09-27 – 2020-09-29 (×9): 750 mg via INTRAVENOUS
  Filled 2020-09-26 (×9): qty 150

## 2020-09-26 MED ORDER — DEXTROSE 50 % IV SOLN
25.0000 g | Freq: Once | INTRAVENOUS | Status: AC
  Administered 2020-09-26: 25 g via INTRAVENOUS

## 2020-09-26 MED ORDER — DEXTROSE 50 % IV SOLN
INTRAVENOUS | Status: AC
  Filled 2020-09-26: qty 50

## 2020-09-26 MED ORDER — HYDROMORPHONE HCL 2 MG PO TABS
8.0000 mg | ORAL_TABLET | ORAL | Status: DC | PRN
  Administered 2020-09-26 – 2020-09-27 (×5): 8 mg
  Filled 2020-09-26 (×5): qty 4

## 2020-09-26 NOTE — Progress Notes (Signed)
Pharmacy Antibiotic Note  Ralph [name] is a 55 y.o. male admitted on 09/01/2020 with esophageal rupture, s/p surgery and multi-organisms growing in pleural fluid Cx.   Currently afebrile, wbc up to 20, scr stable at 0.4. Vancomycin trough was elevated this evening at 25, will adjust dosing to 750 q8 hours for new expected trough of 15. Will consider peak and trough levels at steady state.   Plan: Meropenem 1gm IV q8h Decrease Vancomycin to 738m q8 hours, will plan on rechecking trough at steady state.   Height: _0  (172.7 cm) Weight: 53.2 kg (117 lb 4.6 oz) IBW/kg (Calculated) : 68.4  Temp (24hrs), Avg:97.9 F (36.6 C), Min:97.7 F (36.5 C), Max:98.2 F (36.8 C)  Recent Labs  Lab 09/22/20 0223 09/23/20 0323 09/23/20 0440 09/24/20 0500 09/25/20 0423 09/25/20 0927 09/26/20 0350 09/26/20 1548  WBC 15.0* 13.7*  --  16.7* 21.1*  --  20.6*  --   CREATININE 0.37*  --  0.39* 0.38* 0.35*  --  0.41*  --   VANCOTROUGH  --   --   --   --   --  7*  --  25*     Estimated Creatinine Clearance: 81.3 mL/min (A) (by C-G formula based on SCr of 0.41 mg/dL (L)).    No Known Allergies  Antimicrobials this admission: Meropenem 8/19>  Vancomycin 8/3 > 8/8, 8/19> Cefoxitin 7/28>7/29 Zosyn 8/3 > 8/10 Fluconazole 8/3 > 8/12 Ceftazidime 8/10 >> 8/19 Tobi inhaled 8/10>>8/27 Metronidazole 8/16>>8/19  Dose adjustments this admission: 8/21 VT 7 - increase to 12554mq8 hours 8/22 VT 25 - decrease to 750 q8 hours  Microbiology results: 8/16 Pleural fluid cx: abundant GNR>enterococcus BL+ (S to amp/vanc), prevotella  8/15 BAL: no orgs seen 8/14 Bcx: ng 8/8 Trach aspirate: abundant pseudomonas (pan-sen) 8/4 R pleural fluid > ecoli pan sens, pesuedomonas, streptococcus grp F - pan sens 8/3 Fungal Cx > sent    FrErin HearingharmD., BCPS Clinical Pharmacist 09/26/2020 6:24 PM

## 2020-09-26 NOTE — Progress Notes (Signed)
      [address]       [place] [address]             [phone]                 19 Days Post-Op Procedure(s) (LRB): XI ROBOTIC ASSISTED THORASCOPY-DECORTICATION & REVISION OF ESOPHAGECTOMY (Right) ESOPHAGOGASTRODUODENOSCOPY (EGD) (N/A) INTERCOSTAL NERVE BLOCK (Right)   Events: Trac collar overnight  _______________________________________________________________ Vitals: BP (!) 88/59   Pulse 60   Temp 97.7 F (36.5 C) (Oral)   Resp 15   Ht 5\' 8"  (1.727 m)   Wt 53.2 kg   SpO2 98%   BMI 17.83 kg/m   - Neuro: awake  - Cardiovascular: Sinus  Drips:       - Pulm: Trach collar    FiO2 (%):  [35 %] 35 %  ABG    Component Value Date/Time   PHART 7.414 09/08/2020 0444   PCO2ART 32.8 09/08/2020 0444   PO2ART 98 09/08/2020 0444   HCO3 20.7 09/08/2020 0444   TCO2 22 09/08/2020 0444   ACIDBASEDEF 3.0 (H) 09/08/2020 0444   O2SAT 97.0 09/08/2020 0444    - Abd: Soft - Extremity: Cool  .Intake/Output      08/21 0701 08/22 0700 08/22 0701 08/23 0700   I.V. (mL/kg) 220 (4.1)    Other 610    NG/GT 1320    IV Piggyback 946.7    Total Intake(mL/kg) 3096.7 (58.2)    Urine (mL/kg/hr) 1300 (1)    Drains 0    Stool 0    Total Output 1300    Net +1796.7         Urine Occurrence 1 x    Stool Occurrence 3 x       _______________________________________________________________ Labs: CBC Latest Ref Rng & Units 09/26/2020 09/25/2020 09/24/2020  WBC 4.0 - 10.5 K/uL 20.6(H) 21.1(H) 16.7(H)  Hemoglobin 13.0 - 17.0 g/dL 10.1(L) 9.9(L) 8.7(L)  Hematocrit 39.0 - 52.0 % 32.3(L) 31.0(L) 27.1(L)  Platelets 150 - 400 K/uL 726(H) 775(H) 725(H)   CMP Latest Ref Rng & Units 09/26/2020 09/25/2020 09/24/2020  Glucose 70 - 99 mg/dL 09/26/2020) 025(K) 270(W)  BUN 6 - 20 mg/dL 14 17 237(S)  Creatinine 0.61 - 1.24 mg/dL 28(B) 1.51(V) 6.16(W)  Sodium 135 - 145 mmol/L 135 136 136  Potassium 3.5 - 5.1 mmol/L 4.4 4.2 4.5  Chloride 98 - 111 mmol/L 101 102 99  CO2 22 - 32  mmol/L 29 28 31   Calcium 8.9 - 10.3 mg/dL 9.0 7.37(T) 8.9  Total Protein 6.5 - 8.1 g/dL 6.1(L) - -  Total Bilirubin 0.3 - 1.2 mg/dL 0.6 - -  Alkaline Phos 38 - 126 U/L 98 - -  AST 15 - 41 U/L 25 - -  ALT 0 - 44 U/L 35 - -    CXR: -  _______________________________________________________________  Assessment and Plan: POD 25 s/p robotic Ivor Lewis esophagectomy, postop day 19 status post reexploration with reinforcement of anastomosis.  Neuro: pain controlled CV: brady episodes intermittently.  Asymptomatic.  Will continue to monitor Pulm: pigtail removed.  Will keep L drain until swallow Renal: Creatinine stable. GI:  On TPN. On full dose tube feeds  Heme: Stable ID: WBC up.  On abx.  Will follow Endo: Sliding scale insulin Dispo: Continue ICU care.     09/26/2020 8:28 AM

## 2020-09-26 NOTE — Progress Notes (Signed)
  Speech Language Pathology Treatment: Ralph [name] Speaking valve  Patient Details Name: Ralph [name] MRN: [id number] DOB: 1965/04/12 Today's Date: 09/26/2020 Time: [id number] SLP Time Calculation (min) (ACUTE ONLY): 9 min  Assessment / Plan / Recommendation Clinical Impression  Pt was seen for PMV trial this morning while on TC with cuff mostly deflated at baseline. SLP removed any residual air from cuff prior to PMV placement with strong voicing achieved, although his speech is still slurred - pt does have a h/o CVA and says that his speech is at its baseline without any acute changes. He has limited movement of his articulators when talking, and with cues from SLP to open his mouth more/move his mouth bigger, he was able to increase intelligibility. This morning he was c/o nausea and said he did not sleep overnight. He also had more audible congestion at baseline with increased coughing that was not necessarily productive. He did exhibit transient bradycardia into the high 30s with RN aware. This only happened x1 in relation to coughing episode. PMV was removed upon SLP departure - would continue to provide strict supervision until HR becomes more stable.    HPI HPI: 55 y/o male admitted 7/28 for an Ivor-Lewis esophagectomy due to benign lower esophageal stricture and protein malnutrition.   8/3 pt returned to OR for esophageal rupture with exploration and repair with Jtube placement.  8/9 Tracheostomy.  Had episodes of asymptomatic asystole on 8/17 and bradycardia. Cardiology believes these may be vagally mediated.  PMHx: anxiety, CAD, MI, Stroke, multiple balloon dilations to esophageal stricture.      SLP Plan  Continue with current plan of care       Recommendations         Patient may use Passy-Muir Speech Valve: Intermittently with supervision (short intervals only while on TC and with strict full supervision) PMSV Supervision: Full         Oral Care Recommendations: Oral care  QID Follow up Recommendations: LTACH SLP Visit Diagnosis: Aphonia (R49.1) Plan: Continue with current plan of care       GO                Ralph [name]., M.A. CCC-SLP Acute Rehabilitation Services Pager [phone] Office [phone]  09/26/2020, 10:54 AM

## 2020-09-26 NOTE — Progress Notes (Signed)
Progress Note  Patient Name: Ralph [name] Date of Encounter: 09/26/2020  Primary Cardiologist: Buford Dresser, MD  Subjective   Noted to have asystolic episodes overnight- longest was 6.38 seconds.  Inpatient Medications    Scheduled Meds:  chlorhexidine gluconate (MEDLINE KIT)  15 mL Mouth Rinse BID   Chlorhexidine Gluconate Cloth  6 each Topical Daily   clonazePAM  1 mg Per Tube BID   docusate  100 mg Per Tube BID   enoxaparin (LOVENOX) injection  40 mg Subcutaneous QHS   escitalopram  10 mg Per Tube QHS   feeding supplement (PROSource TF)  45 mL Per Tube TID   Gerhardt's butt cream   Topical QID   HYDROmorphone  8 mg Per Tube Q4H   insulin aspart  0-24 Units Subcutaneous Q4H   liver oil-zinc oxide   Topical BID   mouth rinse  15 mL Mouth Rinse 10 times per day   melatonin  10 mg Per Tube QHS   metoCLOPramide (REGLAN) injection  10 mg Intravenous Q6H   midodrine  10 mg Per J Tube TID   pantoprazole (PROTONIX) IV  40 mg Intravenous Q24H   polyethylene glycol  17 g Per Tube BID   QUEtiapine  50 mg Per Tube BID   sennosides  10 mL Per Tube BID   sodium chloride flush  10 mL Intracatheter Q8H   sodium chloride flush  10-40 mL Intracatheter Q12H   sorbitol  60 mL Per Tube Q0600   tobramycin (PF)  300 mg Nebulization BID   Continuous Infusions:  sodium chloride 10 mL/hr at 09/26/20 0600   feeding supplement (VITAL 1.5 CAL) 60 mL/hr at 09/26/20 0600   meropenem (MERREM) IV 200 mL/hr at 09/26/20 0600   vancomycin 1,250 mg (09/26/20 0757)   PRN Meds: sodium chloride, acetaminophen (TYLENOL) oral liquid 160 mg/5 mL, albuterol, atropine, HYDROmorphone (DILAUDID) injection, sodium chloride flush   Vital Signs    Vitals:   09/26/20 0728 09/26/20 0748 09/26/20 0800 09/26/20 0831  BP:   125/76   Pulse:   79 (!) 58  Resp:   (!) 25 14  Temp:  97.7 F (36.5 C)    TempSrc:  Oral    SpO2: 98%  99% 98%  Weight:      Height:        Intake/Output Summary (Last 24  hours) at 09/26/2020 0835 Last data filed at 09/26/2020 0600 Gross per 24 hour  Intake 3096.69 ml  Output 1300 ml  Net 1796.69 ml   Filed Weights   09/23/20 0500 09/25/20 0427 09/26/20 0500  Weight: 54 kg 53.9 kg 53.2 kg    Telemetry    Multiple periods of asytole overnight-  longest was 6.38 seconds -personally reviewed  ECG   N/A  Physical Exam   GEN: No acute distress, somnolent, on trach col Neck: No JVD. Cardiac: RRR, no gallop.  Respiratory: Nonlabored.  Breathing comfortably on ventilator via trach tube. GI: Soft, nontender, bowel sounds present. MS: No edema.  Labs    Chemistry Recent Labs  Lab 09/22/20 0223 09/23/20 0440 09/24/20 0500 09/25/20 0423 09/26/20 0350  NA 137   < > 136 136 135  K 4.6   < > 4.5 4.2 4.4  CL 106   < > 99 102 101  CO2 27   < > _0 GLUCOSE 103*   < > 101* 127* 117*  BUN 24*   < > 23* 17 14  CREATININE 0.37*   < >  0.38* 0.35* 0.41*  CALCIUM 8.6*   < > 8.9 8.8* 9.0  PROT 5.9*  --   --   --  6.1*  ALBUMIN 1.9*  --   --   --  2.2*  AST 48*  --   --   --  25  ALT 61*  --   --   --  35  ALKPHOS 146*  --   --   --  98  BILITOT 0.6  --   --   --  0.6  GFRNONAA >60   < > >60 >60 >60  ANIONGAP 4*   < > _0 < > = values in this interval not displayed.     Hematology Recent Labs  Lab 09/24/20 0500 09/25/20 0423 09/26/20 0350  WBC 16.7* 21.1* 20.6*  RBC 2.92* 3.32* 3.37*  HGB 8.7* 9.9* 10.1*  HCT 27.1* 31.0* 32.3*  MCV 92.8 93.4 95.8  MCH 29.8 29.8 30.0  MCHC 32.1 31.9 31.3  RDW 15.3 15.5 15.9*  PLT 725* 775* 726*    Radiology    CT CHEST WO CONTRAST  Result Date: 09/24/2020 CLINICAL DATA:  Follow-up of empyema.  Prior esophagectomy. EXAM: CT CHEST WITHOUT CONTRAST TECHNIQUE: Multidetector CT imaging of the chest was performed following the standard protocol without IV contrast. COMPARISON:  Chest radiograph of 1 day prior.  CT 09/19/2020 FINDINGS: Cardiovascular: Right PICC line tip mid right atrium. Aortic  atherosclerosis. Normal heart size, without pericardial effusion. Lad and right coronary artery calcification. Mediastinum/Nodes: No mediastinal or definite hilar adenopathy, given limitations of unenhanced CT. At least partial esophagectomy Lungs/Pleura: Presumed drainage of previously described left pleural fluid collection with trace left-sided pleural fluid remaining. Similar trace right pleural fluid. Bilateral chest tubes in place with trace left-sided pleural air. Tracheostomy appropriately positioned. Again identified is debris/fluid above the tracheostomy and fluid within the right lower lobe endobronchial tree. Moderate centrilobular and paraseptal emphysema. Fluid tracking in the right major fissure. Similar areas of right base subsegmental atelectasis. Dense consolidation in the left lower lobe remains. Extraalveolar air in the left lower lobe on 115/8 communicates with the pleural space. Upper Abdomen: Normal imaged portions of the liver, spleen, pancreas, adrenal glands, kidneys. Musculoskeletal: Convex right thoracic spine curvature. IMPRESSION: 1. Presumed drainage of previously described left pleural fluid collection. Residual left-sided small volume hydropneumothorax with pleural drain in place. 2. Left lower lobe consolidation, suspicious for pneumonia. Areas of extraalveolar air with communication to the pleural space, suggesting necrosis. 3. Trace right-sided pleural fluid with similar right base atelectasis. 4. Aortic atherosclerosis (ICD10-I70.0), coronary artery atherosclerosis and emphysema (ICD10-J43.9). Electronically Signed   By: Abigail Miyamoto M.D.   On: 09/24/2020 10:01   DG Chest Port 1 View  Result Date: 09/26/2020 CLINICAL DATA:  Empyema. EXAM: PORTABLE CHEST 1 VIEW COMPARISON:  09/23/2020 and chest CT 09/24/2020 FINDINGS: Left pigtail chest tube has been removed. There is 1 large bore right chest tube with the tip near the right lung apex. Right arm PICC line tip is near the  superior cavoatrial junction. Patchy parenchymal densities in the mid and lower right chest are unchanged. Negative for right pneumothorax. Rounded density at the left lung base is likely related to pleural densities. Residual patchy densities at the left lung base probably related to combination of pleural and parenchymal densities. Focal lucency near the left costophrenic angle may represent a small pocket of pleural air. Heart size is within normal limits. Patient has a tracheostomy tube. IMPRESSION: 1.  Residual pleural and parenchymal densities in the left lower chest. Left chest tube has been removed. Suspect a small amount of loculated left pleural air in left lower chest. 2. A right chest tube is still present. Negative for right pneumothorax. Persistent patchy densities in the right lower chest. Electronically Signed   By: Markus Daft M.D.   On: 09/26/2020 08:22     Assessment & Plan    1.  Sinus bradycardia and transient high degree heart block. Overnight continued to have significant bradycardia with HR's in the 30's and multiple episodes of asystole. The longest episode was 6.38 seconds. He is not on any AV nodal blockers.  LVEF normal by echocardiogram.  Will ask cardiac EP to evaluate today for possible pacemaker. Hold TF's. He is on midodrine - BP has been soft, this can also cause bradycardia. This appears to be an ongoing issue - not thought to require therapy initially as episodes were at night and he is asymptomatic, but episodes have not improved - I think this should be re-evaluated.  2.  Status post esophagectomy complicated by postoperative esophageal rupture requiring revision.  3.  Acute hypoxic/hypercarbic respiratory failure requiring mechanical ventilation and tracheostomy.  He has an associated Pseudomonas pneumonia and loculated left pleural effusion with chest tube and on broad-spectrum antibiotics per CCM.  Pixie Casino, MD, [facility] [place] Director of the [facility]  Cardiovascular [facility] Diplomate of the American Board of Clinical Lipidology Attending Cardiologist  Direct Dial: [phone]  Fax: [phone]  Website:  [email or web address]  Pixie Casino, MD  09/26/2020, 8:35 AM

## 2020-09-26 NOTE — Significant Event (Signed)
S: Called to bedside after episode of bradycardia after suctioning with 8 second pause.   O:  Vitals:   09/26/20 0005 09/26/20 0100  BP: 110/84 102/67  Pulse: (!) 58 (!) 53  Resp: (!) 21 17  Temp:    SpO2: 97% 97%   Patient MAE, GCS 11T, SB on monitor, on 30% TC  A: Reviewed bedside telemetry, bradycardia with pause. Nursing states that this occurred after suctioning. Resolved with no intervention from staff. Suspect vagally mediated. The patient is being followed by cardiology. Nursing expressed that they felt the patient was altered during this time. No new O2 requirement. GCS 11t  P: Continue to follow cardiology recommendations. Continue observation. Keep atropine at the bedside. Will discuss with Dr. Denese Killings in AM regarding if EP consult is warranted.   Gershon Mussel., MSN, APRN, AGACNP-BC [facility]  09/26/2020 , 2:50 AM  Please see [email or web address] for pager details  If no response, please call [phone] After hours, please call Elink at [phone]

## 2020-09-26 NOTE — Progress Notes (Signed)
   09/26/20 0339  Vitals  Pulse Rate (!) 27  ECG Heart Rate (!) 27  Resp 13  Oxygen Therapy  SpO2 96 %  MEWS Score  MEWS Temp 0  MEWS Systolic 0  MEWS Pulse 2  MEWS RR 1  MEWS LOC 1  MEWS Score 4  MEWS Score Color Red  While at bedside collecting am labs from RUA PICC patient HR dropped to 27. Atropine given. Patient now ST. Will continue to monitor closely. Thank you.

## 2020-09-26 NOTE — Plan of Care (Signed)
  Problem: Clinical Measurements: Goal: Postoperative complications will be avoided or minimized Outcome: Progressing   Problem: Respiratory: Goal: Ability to maintain a clear airway will improve Outcome: Progressing   Problem: Skin Integrity: Goal: Demonstration of wound healing without infection will improve Outcome: Progressing   

## 2020-09-26 NOTE — Progress Notes (Signed)
NAME:  Ralph [name], MRN:  [id number], DOB:  1965/09/30, LOS: 25 ADMISSION DATE:  09/01/2020, CONSULTATION DATE:  8/3 REFERRING MD:  Dr. Cliffton Asters, CHIEF COMPLAINT:  esophageal rupture, esophageal stricture    History of Present Illness:  Patient is a 55 yo M w/ PMH anxiety, CAD, MI, stroke presents to [facility] on 7/28 for esophagectomy w/ Dr. Cliffton Asters for esophageal stricture.  On 7/28, Dr. Cliffton Asters performed the esophagectomy without complication.  8/3 patient taken back to OR for esophageal rupture s/p esophagectomy by Dr. Cliffton Asters. Unable to extubate post procedure. Patient remains intubated on mechanical ventilation. ABG with respiratory acidosis.    PCCM consulted for vent management. He remains critically ill  Pertinent  Medical History   Past Medical History:  Diagnosis Date   Anxiety    Coronary artery disease    Myocardial infarction [facility])    Stroke (HCC)     Significant Hospital Events: Including procedures, antibiotic start and stop dates in addition to other pertinent events   7/28 admitted for planned esophagectomy. Chest tube was placed on right chest. J tube in place. 2 JP drains in place. Patient was extubated post procedure. Patient developed subcutaneous emphysema. Unable to use J tube due to leakage around site. 8/3: PCCM consulted for vent management after pt brought back to OR for esophageal rupture/ leak. Vanc/zosyn/fluconazole started. Hypotension noted post-op. Felt 2/2 Sepsis; treated w/ volume and pressors.  8/4: Still on norepinephrine.  Central venous pressure suggesting he is still hypovolemic so receiving fluid.  Spontaneous breathing trial initiated but not ready for extubation due to work of breathing.  culture sent from chest tube.  J-tube replaced in interventional radiology due to leaking.  Tube feeds resumed in the afternoon 8/5: J-tube site again leaking, tube feeds placed on hold around midnight.Still pressor dependent . Passed SBT. WOB improved.   Failed extubation and was reintubated 8/8: J-tube upsized 8/9: Tracheostomy 8/10 J-tube continues to leak. TF stopped. RT IJ CVC removed. 8/13 starting trickle feeds 8/15 bronch 8/16 pigtail to L fluid collection 8/19 Left Chest Tube removed  8/22 Switch dilaudid to PRN   Antibiotics: Cefoxitin 7/28-7/29 Fluconazole 8/3>>8/17 Gentamicin 8/3 Zosyn 8/3>8/10 Vancomycin 8/3-8/7  Ceftazidime 8/10>> Tobramycin nebs 8/10>> Flagyl 8/16 >>8/19 Vanc 8/19 >>  Meropenem 8/19 >>  Interim History / Subjective:  Patient is doing well today. Had some concerns with bradycardia as well as an prolonged episode of asystole on telemetry (6.38 seconds). Most likely due to suctioning and coughing leading to vagal stimulation.   Objective   Blood pressure 125/76, pulse (!) 58, temperature 97.7 F (36.5 C), temperature source Oral, resp. rate 14, height 5\' 8"  (1.727 m), weight 53.2 kg, SpO2 98 %.    FiO2 (%):  [28 %-35 %] 28 %   Intake/Output Summary (Last 24 hours) at 09/26/2020 1053 Last data filed at 09/26/2020 0600 Gross per 24 hour  Intake 3096.69 ml  Output 1300 ml  Net 1796.69 ml    Filed Weights   09/23/20 0500 09/25/20 0427 09/26/20 0500  Weight: 54 kg 53.9 kg 53.2 kg    Examination: Cachetic man on vent, minimally responsive Lungs with rhonci overall improved, triggering vent Abdomen soft, R JP drain in place, L pigtail out, has JP on R draining esophagectomy site Muscle wasting without edema  WBC staying stable (21.1 --> 20.6)  Plts up   Resolved Hospital Problem list   Constipation   Assessment & Plan:  Acute respiratory failure w/ hypoxia and hypercarbia requiring mechanical ventilation.  s/p tracheostomy (8/9) HAP- Pan-sensitive Pseudomonas pneumonia L loculated effusion, pneumonia with necrosis-  s/p Chest Tube (8/16), culture with prevotella, enterococcus; pigtail out 8/20 Rising white count -  White count remaining stable (21.1 - 20.6). Patient still afebrile and  chest X ray still exhibiting residual pleural and parenchymal densities in the left lower chest.   Plan  - Vanc/meropenem x 10-14 days, plan to stop 8/25 - Trial of 24/7 trach collar  Esophageal stricture s/p esophagectomy complicated by post operative esophageal rupture and J-tube leakage Severe protein calorie malnutrition, predates surgery   Leaking J tube- intermittent issue throughout stay, CT a/p looks okay position.  J-tube upsized 8/8. Tube feeding stopped again on 8/11 due to leak around J tube, yellow/green drainage overnight per nursing. 8/13 restarted trickle tube feeds, but stopped due to pain. Pain did not resolve after TF cessation and TF restarted 8/14.  Constipation- improved, large BM 8/20 Plan - Continue reglan - Continue miralax, sennosides, sorbitol, and docusate  - Continue TF - JP management per TCTS  Bradycardia, QTC 390, question vagal events Consulted with nurse, and vagal events most likely secondary to suction and coughing events.  Plan -Cardiology Consulted >> stopped dobutamine 8/17 -Atropine PRN if prolonged  - Continue to monitor episodes of bradycardia - Consider midodrine as cause if bradycardia continues although not likely   Pressure Wound - Sacrum -Continue q2h turn -Continue foam dressing  Depression- continue lexapro; continue seroquel, Qtc 8/20 407  Best Practice (right click and "Reselect all SmartList Selections" daily)   Diet/type: TF DVT prophylaxis: LMWH GI prophylaxis: PPI Lines: NG, J tube, PICC Foley:  Condom cath Code Status:  full code Last date of multidisciplinary goals of care discussion: goal is LTACH  Myrla Halsted MD PCCM

## 2020-09-26 NOTE — Consult Note (Signed)
Cardiology Consultation:   Patient ID: Ralph [name] MRN: [id number]; DOB: 09-04-1965  Admit date: 09/01/2020 Date of Consult: 09/26/2020  PCP:  Merryl Hacker No   [facility] Providers Cardiologist:  Buford Dresser, MD   Patient Profile:   Ralph [name] is a 55 y.o. male with a hx of esophageal stricture with J-tube prior to admission, prior stroke, CAD (unclear as to extent), cmoker,  who is being seen 09/26/2020 for the evaluation of bradycardia  at the request of CCM.  History of Present Illness:   Ralph [name] was admitted 09/01/20 with a diagnosis of esophageal stricture for surgery.  He had  ESOPHAGOGASTRODUODENOSCOPY (EGD), XI ROBOTIC ASSISTED IVOR LEWIS ESOPHAGECTOMY, and INTERCOSTAL NERVE BLOCK day of admission with Dr. Kipp Brood. He had significant post op pain requiring PCA pump, CTS notes discuss SB stable Had persistent bilious output from the J-tube site, evaluated by IR with stable positioning, despite this drainage continued. taken back to the OR for endoscopy and reexploration with concern for anastomotic leak on 09/07/2020 with subsequent reinforcement of esophageal anastomosis.  Patient was unable to be extubated therefore critical care medicine was consulted.  He continued to have issues with hypotension felt to be secondary to sepsis.  He passed SBT and was extubated 09/09/20 however ultimately failed and was quickly re intubated.  Tracheostomy was placed 09/13/20.  He developed hospital-acquired pneumonia positive for Pseudomonas therefore antibiotics were titrated.  He was placed on midodrine due to persistent hypotension.  He underwent a bronchoscopy with lavage 09/19/2020 due to mucous plugging. Loculated effusion no with chest tube placement  Has a sacral pressure wound  Abx so far..... Cefoxitin 7/28-7/29 Fluconazole 8/3>>8/17 Gentamicin 8/3 Zosyn 8/3>8/10 Vancomycin 8/3-8/7 Ceftazidime 8/10 Tobramycin nebs 8/10>> continues Flagyl 8/16 >>8/19 Vanc 8/19 >>  continues Meropenem 8/19 >> continues  Cardiology consulted 09/21/20 2/2 bradycardia, in review of the consult noted had episodes are consistent with Mobitz Type II AV block and in d/w nursing seemed to correlated with times of being suctioned He had been started on dobutamine for HR support Dr. Harrell Gave noting som preceding PR prolongation suspected bradycardia/heart block was vagally mediated, dobutamine stopped and planned no particular treatment at the time, to use atropine if needed N the subsequent days more episodes of CHB and bradycardia with events of 6 episodes lasting seconds that were asymptomatic, that responded to atropine, again felt 2/2 esophagectomy, trach, critical illness, planned to treated for symptoms PRN TTE noted LVEF 60-65%, mo WMA no significant VHD Planned to monitor with hopes that as his conditioned improved and he started to mobilize that his HR would improved with significant concerns of an infection risk for PPM  EP is asked by cardiology today to weigh in with more pauses or asystolic events overnight, longest as long as 6.3 seconds. He is not on traditional nodal blocking agents though is on midodrine for hypotension.  Pigtail from R chest removed this AM  Disposition it seems for now is to a goal of LTACH facility  LABS today K+ 4.2 BUN/Creat 17/0.35 WBC 21 (rising) H/H 9.9/31 Plts 775  8/8 resp cx pseudomonas 09/18/20 BC x2 were neg 5 days 09/20/20 Pleural fluid gram stain ENTEROCOCCUS FAECALIS   He is s/p clonazepam, reglan, and dilaudid, wakes and slowly follows some commands, though nothing else in the way of interaction this AM   Past Medical History:  Diagnosis Date   Anxiety    Coronary artery disease    Myocardial infarction ([place])    Stroke ([place])  Past Surgical History:  Procedure Laterality Date   APPENDECTOMY     BALLOON DILATION N/A 06/01/2019   Procedure: BALLOON DILATION;  Surgeon: Clarene Essex, MD;  Location: [facility];   Service: Endoscopy;  Laterality: N/A;   BALLOON DILATION N/A 07/16/2019   Procedure: BALLOON DILATION;  Surgeon: Clarene Essex, MD;  Location: [facility];  Service: Endoscopy;  Laterality: N/A;   BALLOON DILATION N/A 09/09/2019   Procedure: BALLOON DILATION;  Surgeon: Clarene Essex, MD;  [facility]: [facility];  Service: Endoscopy;  Laterality: N/A;   BIOPSY  04/29/2019   Procedure: BIOPSY;  Surgeon: Wilford Corner, MD;  Location: [place];  Service: Endoscopy;;   BIOPSY  05/27/2019   Procedure: BIOPSY;  Surgeon: Arta Silence, MD;  [facility];  Service: Endoscopy;;   BIOPSY  06/01/2019   Procedure: BIOPSY;  Surgeon: Clarene Essex, MD;  [facility];  Service: Endoscopy;;   BIOPSY  07/16/2019   Procedure: BIOPSY;  Surgeon: Clarene Essex, MD;  [facility];  Service: Endoscopy;;   BIOPSY  05/27/2020   Procedure: BIOPSY;  Surgeon: Mauri Pole, MD;  Location: [facility]COPY;  Service: Endoscopy;;   ESOPHAGOGASTRODUODENOSCOPY N/A 06/01/2019   Procedure: ESOPHAGOGASTRODUODENOSCOPY (EGD);  Surgeon: Clarene Essex, MD;  Location: [facility];  Service: Endoscopy;  Laterality: N/A;   ESOPHAGOGASTRODUODENOSCOPY N/A 05/31/2020   Procedure: ESOPHAGOGASTRODUODENOSCOPY (EGD);  Surgeon: Lajuana Matte, MD;  [facility];  Service: Thoracic;  Laterality: N/A;   ESOPHAGOGASTRODUODENOSCOPY N/A 09/01/2020   Procedure: ESOPHAGOGASTRODUODENOSCOPY (EGD);  Surgeon: Lajuana Matte, MD;  Location: [facility] OR;  Service: Thoracic;  Laterality: N/A;   ESOPHAGOGASTRODUODENOSCOPY N/A 09/07/2020   Procedure: ESOPHAGOGASTRODUODENOSCOPY (EGD);  Surgeon: Lajuana Matte, MD;  Location: [place];  Service: Thoracic;  Laterality: N/A;   ESOPHAGOGASTRODUODENOSCOPY (EGD) WITH PROPOFOL N/A 04/29/2019   Procedure: ESOPHAGOGASTRODUODENOSCOPY (EGD) WITH PROPOFOL;  Surgeon: Wilford Corner, MD;  Location: [place];  Service: Endoscopy;  Laterality: N/A;   ESOPHAGOGASTRODUODENOSCOPY (EGD)  WITH PROPOFOL N/A 05/27/2019   Procedure: ESOPHAGOGASTRODUODENOSCOPY (EGD) WITH PROPOFOL;  Surgeon: Arta Silence, MD;  [facility];  Service: Endoscopy;  Laterality: N/A;   ESOPHAGOGASTRODUODENOSCOPY (EGD) WITH PROPOFOL N/A 07/16/2019   Procedure: ESOPHAGOGASTRODUODENOSCOPY (EGD) WITH PROPOFOL;  Surgeon: Clarene Essex, MD;  [facility];  Service: Endoscopy;  Laterality: N/A;   ESOPHAGOGASTRODUODENOSCOPY (EGD) WITH PROPOFOL N/A 09/09/2019   Procedure: ESOPHAGOGASTRODUODENOSCOPY (EGD) WITH PROPOFOL;  Surgeon: Clarene Essex, MD;  [facility];  Service: Endoscopy;  Laterality: N/A;   ESOPHAGOGASTRODUODENOSCOPY (EGD) WITH PROPOFOL N/A 05/27/2020   Procedure: ESOPHAGOGASTRODUODENOSCOPY (EGD) WITH PROPOFOL;  Surgeon: Mauri Pole, MD;  Location: [place] [facility];  Service: Endoscopy;  Laterality: N/A;   FINE NEEDLE ASPIRATION  09/09/2019   Procedure: FINE NEEDLE ASPIRATION (FNA) LINEAR;  Surgeon: Clarene Essex, MD;  Location: [facility];  Service: Endoscopy;;   FOREIGN BODY REMOVAL  07/16/2019   Procedure: FOREIGN BODY REMOVAL;  Surgeon: Clarene Essex, MD;  [facility];  Service: Endoscopy;;   FOREIGN BODY REMOVAL  05/27/2020   Procedure: FOREIGN BODY REMOVAL;  Surgeon: Mauri Pole, MD;  Location: [place];  Service: Endoscopy;;   INTERCOSTAL NERVE BLOCK Right 09/01/2020   Procedure: INTERCOSTAL NERVE BLOCK;  Surgeon: Lajuana Matte, MD;  Location: [place];  Service: Thoracic;  Laterality: Right;   INTERCOSTAL NERVE BLOCK Right 09/07/2020   Procedure: INTERCOSTAL NERVE BLOCK;  Surgeon: Lajuana Matte, MD;  Location: [place];  Service: Thoracic;  Laterality: Right;   IR [place] DUODEN/JEJUNO TUBE PERCUT Vivianne Master  09/08/2020  IR [facility] DUODEN/JEJUNO TUBE PERCUT W/FLUORO  09/12/2020   LAPAROSCOPIC JEJUNOSTOMY N/A 05/31/2020   Procedure: LAPAROSCOPIC JEJUNOSTOMY;  Surgeon: Lajuana Matte, MD;  Location: [place];  Service: Thoracic;  Laterality: N/A;  EGD,  C-arm required.   SHOULDER SURGERY     UPPER ESOPHAGEAL ENDOSCOPIC ULTRASOUND (EUS) N/A 09/09/2019   Procedure: UPPER ESOPHAGEAL ENDOSCOPIC ULTRASOUND (EUS);  Surgeon: Arta Silence, MD;  Location: [facility];  Service: Endoscopy;  Laterality: N/A;     Home Medications:  Prior to Admission medications   Medication Sig Start Date End Date Taking? Authorizing Provider  diclofenac Sodium (VOLTAREN) 1 % GEL Apply 2 g topically daily as needed (pain to left axillary region). 06/10/20  Yes Gifford Shave, MD  gabapentin (NEURONTIN) 250 MG/5ML solution Place 6 mLs (300 mg total) into feeding tube every 8 (eight) hours. 07/08/20  Yes Lightfoot, Lucile Crater, MD  lidocaine (LIDODERM) 5 % Place 1 patch onto the skin daily. Remove & Discard patch within 12 hours or as directed by MD 06/14/20  Yes [name], Jamal Collin, MD  Nutritional Supplements (FEEDING SUPPLEMENT, JEVITY 1.2 CAL,) LIQD Place 1,000 mLs into feeding tube continuous. 06/10/20  Yes Gifford Shave, MD  oxyCODONE (ROXICODONE) 5 MG/5ML solution Place 5 mLs (5 mg total) into feeding tube at bedtime as needed for severe pain. Patient taking differently: Place 5 mg into feeding tube 2 (two) times daily as needed for severe pain. 07/08/20  Yes Lightfoot, Lucile Crater, MD  polyvinyl alcohol (LIQUIFILM TEARS) 1.4 % ophthalmic solution Place 1 drop into both eyes as needed for dry eyes.   Yes [provider]  acetaminophen (TYLENOL) 160 MG/5ML suspension Place 20.3 mLs (650 mg total) into feeding tube every 6 (six) hours. Patient not taking: No sig reported 06/10/20   Gifford Shave, MD  mupirocin ointment (BACTROBAN) 2 % Apply 1 application topically 2 (two) times daily. Patient not taking: No sig reported 06/23/20   Zenia Resides, MD  pantoprazole sodium (PROTONIX) 40 mg/20 mL PACK Place 20 mLs (40 mg total) into feeding tube 2 (two) times daily. Patient not taking: No sig reported 06/10/20   Gifford Shave, MD  sennosides (SENOKOT) 8.8 MG/5ML  syrup Place 5 mLs into feeding tube daily. Patient not taking: No sig reported 06/11/20   Gifford Shave, MD  sucralfate (CARAFATE) 1 GM/10ML suspension Place 10 mLs (1 g total) into feeding tube 4 (four) times daily -  with meals and at bedtime. Patient not taking: No sig reported 07/08/20   Lajuana Matte, MD    Inpatient Medications: Scheduled Meds:  chlorhexidine gluconate (MEDLINE KIT)  15 mL Mouth Rinse BID   Chlorhexidine Gluconate Cloth  6 each Topical Daily   clonazePAM  1 mg Per Tube BID   docusate  100 mg Per Tube BID   enoxaparin (LOVENOX) injection  40 mg Subcutaneous QHS   escitalopram  10 mg Per Tube QHS   feeding supplement (PROSource TF)  45 mL Per Tube TID   Gerhardt's butt cream   Topical QID   HYDROmorphone  8 mg Per Tube Q4H   insulin aspart  0-24 Units Subcutaneous Q4H   liver oil-zinc oxide   Topical BID   mouth rinse  15 mL Mouth Rinse 10 times per day   melatonin  10 mg Per Tube QHS   metoCLOPramide (REGLAN) injection  10 mg Intravenous Q6H   midodrine  10 mg Per J Tube TID   pantoprazole (PROTONIX) IV  40 mg Intravenous Q24H  polyethylene glycol  17 g Per Tube BID   QUEtiapine  50 mg Per Tube BID   sennosides  10 mL Per Tube BID   sodium chloride flush  10 mL Intracatheter Q8H   sodium chloride flush  10-40 mL Intracatheter Q12H   sorbitol  60 mL Per Tube Q0600   tobramycin (PF)  300 mg Nebulization BID   Continuous Infusions:  sodium chloride 10 mL/hr at 09/26/20 0600   feeding supplement (VITAL 1.5 CAL) 60 mL/hr at 09/26/20 0600   meropenem (MERREM) IV 200 mL/hr at 09/26/20 0600   vancomycin 1,250 mg (09/26/20 0757)   PRN Meds: sodium chloride, acetaminophen (TYLENOL) oral liquid 160 mg/5 mL, albuterol, atropine, HYDROmorphone (DILAUDID) injection, sodium chloride flush  Allergies:   No Known Allergies  Social History:   Social History   Socioeconomic History   Marital status: Single    Spouse name: Not on file   Number of children:  Not on file   Years of education: Not on file   Highest education level: Not on file  Occupational History   Not on file  Tobacco Use   Smoking status: Every Day    Packs/day: 1.00    Years: 20.00    Pack years: 20.00    Types: Cigarettes   Smokeless tobacco: Never  Vaping Use   Vaping Use: Former  Substance and Sexual Activity   Alcohol use: Yes    Alcohol/week: 2.0 standard drinks    Types: 1 Cans of beer, 1 Standard drinks or equivalent per week    Comment: 1-2 cans of beer per week   Drug use: Not Currently   Sexual activity: Yes    Comment: with wife  Other Topics Concern   Not on file  Social History Narrative   Not on file   Social Determinants of Health   Financial Resource Strain: Not on file  Food Insecurity: Not on file  Transportation Needs: Not on file  Physical Activity: Not on file  Stress: Not on file  Social Connections: Not on file  Intimate Partner Violence: Not on file    Family History:   Family History  Problem Relation Age of Onset   Healthy Mother    Heart attack Father      ROS:  Please see the history of present illness.  All other ROS reviewed and negative.     Physical Exam/Data:   Vitals:   09/26/20 0728 09/26/20 0748 09/26/20 0800 09/26/20 0831  BP:   125/76   Pulse:   79 (!) 58  Resp:   (!) 25 14  Temp:  97.7 F (36.5 C)    TempSrc:  Oral    SpO2: 98%  99% 98%  Weight:      Height:        Intake/Output Summary (Last 24 hours) at 09/26/2020 1015 Last data filed at 09/26/2020 0600 Gross per 24 hour  Intake 3096.69 ml  Output 1300 ml  Net 1796.69 ml   Last 3 Weights 09/26/2020 09/25/2020 09/23/2020  Weight (lbs) 117 lb 4.6 oz 118 lb 13.3 oz 119 lb 0.8 oz  Weight (kg) 53.2 kg 53.9 kg 54 kg     Body mass index is 17.83 kg/m.  General:  appears quite ill, but in no distress HEENT: normal Lymph: no adenopathy Neck: no JVD, + trach collar Endocrine:  No thryomegaly Vascular: No carotid bruits  Cardiac:  RRR; no  murmurs, gallops or rubs Lungs:  on trach collar, some soft ronchi  b/l, no wheezing, no crackles, rales  Abd: soft, nontender, J Tube L  Ext: no edema Musculoskeletal: cachetic in appearance Skin: warm and dry  Neuro:  difficult to assess, opens eyes to verbal and squezes hands when asked to Psych:  cooperative   EKG:  The EKG was personally reviewed and demonstrates:    #1 8/8:  SB 56bpm, normal intervals/axis, QRS 88 #2 SR 96, no acute changes #3 SR 91, no changes #4 SR 72 Last 8/20: SB/sinus arrhythmia 53  OLD 08/16/20: SBB 55bpm, normal intervals/axis  Telemetry:  Telemetry was personally reviewed and demonstrates:   SB/SR 50's-60's Recurrent sinus pauses this mornring 8 seconds. All that I have seen have sinus slowing preceding They are day and night. This AM at least NOT associated with suctioning  Relevant CV Studies:  Echo 09/22/20 1. Left ventricular ejection fraction, by estimation, is 60 to 65%. The  left ventricle has normal function. The left ventricle has no regional  wall motion abnormalities. There is mild left ventricular hypertrophy.  Left ventricular diastolic parameters  were normal.   2. Right ventricular systolic function is normal. The right ventricular  size is normal. Tricuspid regurgitation signal is inadequate for assessing  PA pressure.   3. The mitral valve is normal in structure. No evidence of mitral valve  regurgitation. No evidence of mitral stenosis.   4. The aortic valve was not well visualized. Aortic valve regurgitation  is not visualized. No aortic stenosis is present.    Stress test 07/20/20: The left ventricular ejection fraction is normal (55-65%). Nuclear stress EF: 57%. There was no ST segment deviation noted during stress. No T wave inversion was noted during stress. The study is normal. This is a low risk study with no evidence of ischemia.   Echocardiogram 04/30/19:  1. Normal LV systolic function; grade 1 diastolic  dysfunction.   2. Left ventricular ejection fraction, by estimation, is 60 to 65%. The  left ventricle has normal function. The left ventricle has no regional  wall motion abnormalities. Left ventricular diastolic parameters are  consistent with Grade I diastolic  dysfunction (impaired relaxation).   3. Right ventricular systolic function is normal. The right ventricular  size is normal.   4. The mitral valve is normal in structure. No evidence of mitral valve  regurgitation. No evidence of mitral stenosis.   5. The aortic valve was not well visualized. Aortic valve regurgitation  is not visualized. No aortic stenosis is present.   6. The inferior vena cava is normal in size with greater than 50%  respiratory variability, suggesting right atrial pressure of 3 mmHg.   Laboratory Data:  High Sensitivity Troponin:  No results for input(s): TROPONINIHS in the last 720 hours.   Chemistry Recent Labs  Lab 09/24/20 0500 09/25/20 0423 09/26/20 0350  NA 136 136 135  K 4.5 4.2 4.4  CL 99 102 101  CO2 31 28 29   GLUCOSE 101* 127* 117*  BUN 23* 17 14  CREATININE 0.38* 0.35* 0.41*  CALCIUM 8.9 8.8* 9.0  GFRNONAA >60 >60 >60  ANIONGAP 6 6 5     Recent Labs  Lab 09/22/20 0223 09/26/20 0350  PROT 5.9* 6.1*  ALBUMIN 1.9* 2.2*  AST 48* 25  ALT 61* 35  ALKPHOS 146* 98  BILITOT 0.6 0.6   Hematology Recent Labs  Lab 09/24/20 0500 09/25/20 0423 09/26/20 0350  WBC 16.7* 21.1* 20.6*  RBC 2.92* 3.32* 3.37*  HGB 8.7* 9.9* 10.1*  HCT 27.1* 31.0* 32.3*  MCV 92.8 93.4 95.8  MCH 29.8 29.8 30.0  MCHC 32.1 31.9 31.3  RDW 15.3 15.5 15.9*  PLT 725* 775* 726*   BNPNo results for input(s): BNP, PROBNP in the last 168 hours.  DDimer No results for input(s): DDIMER in the last 168 hours.   Radiology/Studies:   CT CHEST WO CONTRAST Result Date: 09/24/2020 CLINICAL DATA:  Follow-up of empyema.  Prior esophagectomy. EXAM: CT CHEST WITHOUT CONTRAST TECHNIQUE: Multidetector CT imaging of the  chest was performed following the standard protocol without IV contrast. COMPARISON:  Chest radiograph of 1 day prior.  CT 09/19/2020 FINDINGS: Cardiovascular: Right PICC line tip mid right atrium. Aortic atherosclerosis. Normal heart size, without pericardial effusion. Lad and right coronary artery calcification. Mediastinum/Nodes: No mediastinal or definite hilar adenopathy, given limitations of unenhanced CT. At least partial esophagectomy Lungs/Pleura: Presumed drainage of previously described left pleural fluid collection with trace left-sided pleural fluid remaining. Similar trace right pleural fluid. Bilateral chest tubes in place with trace left-sided pleural air. Tracheostomy appropriately positioned. Again identified is debris/fluid above the tracheostomy and fluid within the right lower lobe endobronchial tree. Moderate centrilobular and paraseptal emphysema. Fluid tracking in the right major fissure. Similar areas of right base subsegmental atelectasis. Dense consolidation in the left lower lobe remains. Extraalveolar air in the left lower lobe on 115/8 communicates with the pleural space. Upper Abdomen: Normal imaged portions of the liver, spleen, pancreas, adrenal glands, kidneys. Musculoskeletal: Convex right thoracic spine curvature. IMPRESSION: 1. Presumed drainage of previously described left pleural fluid collection. Residual left-sided small volume hydropneumothorax with pleural drain in place. 2. Left lower lobe consolidation, suspicious for pneumonia. Areas of extraalveolar air with communication to the pleural space, suggesting necrosis. 3. Trace right-sided pleural fluid with similar right base atelectasis. 4. Aortic atherosclerosis (ICD10-I70.0), coronary artery atherosclerosis and emphysema (ICD10-J43.9). Electronically Signed   By: Abigail Miyamoto M.D.   On: 09/24/2020 10:01    DG Chest Port 1 View Result Date: 09/26/2020 CLINICAL DATA:  Empyema. EXAM: PORTABLE CHEST 1 VIEW COMPARISON:   09/23/2020 and chest CT 09/24/2020 FINDINGS: Left pigtail chest tube has been removed. There is 1 large bore right chest tube with the tip near the right lung apex. Right arm PICC line tip is near the superior cavoatrial junction. Patchy parenchymal densities in the mid and lower right chest are unchanged. Negative for right pneumothorax. Rounded density at the left lung base is likely related to pleural densities. Residual patchy densities at the left lung base probably related to combination of pleural and parenchymal densities. Focal lucency near the left costophrenic angle may represent a small pocket of pleural air. Heart size is within normal limits. Patient has a tracheostomy tube. IMPRESSION: 1. Residual pleural and parenchymal densities in the left lower chest. Left chest tube has been removed. Suspect a small amount of loculated left pleural air in left lower chest. 2. A right chest tube is still present. Negative for right pneumothorax. Persistent patchy densities in the right lower chest. Electronically Signed   By: Markus Daft M.D.   On: 09/26/2020 08:22     Assessment and Plan:   Recurrent pauses/asystolic events No baseline conduction system disease Not on any traditional nodal blocking agents Is on  midodrine 2.56m (seems started POD #1) 158mTID Reglan has bradycardia listed as a common reaction and AV block as a serious reaction scheduled Q6  A few of his meds can be contributing to his hypotension  Agree that these events are vagal, and likely  trach/mechanical stimulation At this juncture not indicated for pacing Avoid triggering issues Try to avoid   Risk Assessment/Risk Scores:    For questions or updates, please contact [facility] Please consult [email or web address] for contact info under    Signed, Baldwin Jamaica, PA-C  09/26/2020 10:15 AM]  Recurrent bradycardia with concomitant PP prolongation in 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  I suspect that this is being triggered by his trach and if this is modifiable so as to make it less irritating in his airway somehow it may well reduce the frequency of these events  There is no indication for pacing at this time.  We will be available as needed  Virl Axe

## 2020-09-26 NOTE — Progress Notes (Signed)
Physical Therapy Treatment Patient Details Name: Ralph [name] MRN: [id number] DOB: 1965-11-27 Today's Date: 09/26/2020    History of Present Illness 55 y/o male admitted 7/28 for an Ivor-Lewis esophagectomy due to benign lower esophageal stricture and protein malnutrition.   8/3 pt returned to OR for esophageal rupture with exploration and repair with Jtube placement.  8/9 Tracheostomy.  PMHx: anxiety, CAD, MI, Stroke, multiple balloon dilations to esophageal stricture.    PT Comments    Pt pleasant with reports of pain at chest able to stand with decreased assist today. Pt continues to fatigue quickly with limited tolerance for seated and upright activities. PT with HR 72-30 during session with 2 brief episodes of bradycardia to 30 during session with RN and cardiology aware and present.   BP supine 125/76 (91) with HR 60 Supine with HR 30 BP 136/80 (91) Sitting 110/56 (68), HR 65 Pt on trach collar 28% with SpO2 96%    Follow Up Recommendations  LTACH;Supervision/Assistance - 24 hour     Equipment Recommendations  Rolling walker with 5" wheels;Hospital bed    Recommendations for Other Services       Precautions / Restrictions Precautions Precautions: Fall;Other (comment) Precaution Comments: J tube, Cortrak, trach, 2 jp drains, watch HR Restrictions Weight Bearing Restrictions: No    Mobility  Bed Mobility Overal bed mobility: Needs Assistance Bed Mobility: Supine to Sit;Sit to Supine           General bed mobility comments: foot egress to transition supine <>sit. PT assisting with bending knees and reaching for rail to slide up in bed with max +2 assist    Transfers Overall transfer level: Needs assistance   Transfers: Sit to/from Stand Sit to Stand: Min assist;+2 physical assistance         General transfer comment: min +2 with bil knees blocked to stand from end of bed x 2 trials grossly 30 sec each. Pt denied attempting to weight shift, increased trials  or time in standing  Ambulation/Gait             General Gait Details: Unable to assess   Stairs             Wheelchair Mobility    Modified Rankin (Stroke Patients Only)       Balance Overall balance assessment: Needs assistance Sitting-balance support: Bilateral upper extremity supported;No upper extremity supported;Feet supported Sitting balance-Leahy Scale: Poor Sitting balance - Comments: kyphotic with need for UE suppor in unsupported sitting   Standing balance support: Bilateral upper extremity supported Standing balance-Leahy Scale: Poor Standing balance comment: bil UE support without knee buckling today                            Cognition Arousal/Alertness: Awake/alert Behavior During Therapy: Flat affect Overall Cognitive Status: Within Functional Limits for tasks assessed                                 General Comments: pt following commands and mouthing words although difficult to understand at times      Exercises General Exercises - Lower Extremity Long Arc Quad: AROM;Both;5 reps;Seated Heel Slides: AROM;Both;Supine;10 reps Straight Leg Raises: AAROM;Supine;Both;10 reps    General Comments        Pertinent Vitals/Pain Pain Score: 5  Pain Location: across his chest Pain Descriptors / Indicators: Sore;Aching;Pressure Pain Intervention(s): Limited activity within patient's tolerance;Monitored  during session;Repositioned    Home Living                      Prior Function            PT Goals (current goals can now be found in the care plan section) Progress towards PT goals: Progressing toward goals    Frequency    Min 3X/week      PT Plan Current plan remains appropriate    Co-evaluation              AM-PAC PT "6 Clicks" Mobility   Outcome Measure  Help needed turning from your back to your side while in a flat bed without using bedrails?: A Lot Help needed moving from lying on  your back to sitting on the side of a flat bed without using bedrails?: A Lot Help needed moving to and from a bed to a chair (including a wheelchair)?: Total Help needed standing up from a chair using your arms (e.g., wheelchair or bedside chair)?: Total Help needed to walk in hospital room?: Total Help needed climbing 3-5 steps with a railing? : Total 6 Click Score: 8    End of Session Equipment Utilized During Treatment: Oxygen Activity Tolerance: Patient tolerated treatment well Patient left: in bed;with bed alarm set;with call bell/phone within reach Nurse Communication: Mobility status PT Visit Diagnosis: Other abnormalities of gait and mobility (R26.89);Muscle weakness (generalized) (M62.81);Difficulty in walking, not elsewhere classified (R26.2)     Time: 6962-9528 PT Time Calculation (min) (ACUTE ONLY): 25 min  Charges:  $Therapeutic Exercise: 8-22 mins $Therapeutic Activity: 8-22 mins                     Sharna Gabrys P, PT Acute Rehabilitation Services Pager: [phone] Office: [phone]    Radonna Bracher B Anayi Bricco 09/26/2020, 10:03 AM

## 2020-09-26 NOTE — Progress Notes (Signed)
Andee Lineman)  NP at bedside to discuss pt symptomatic bradycardia and 8.31 second asystole. Plan is to keep atropine at bedside and give if patient has another episode of symptomatic bradycardia. Also, CCM team will update Cardiology team during am rounds. Recommendations at this time per Lorin Picket NP is to continue with POC and monitor patient closely. Thank you.

## 2020-09-26 NOTE — TOC Progression Note (Signed)
Transition of Care [facility]) - Progression Note    Patient Details  Name: Ralph [name] MRN: [id number] Date of Birth: 07-19-1965  [facility] of [facility]) CM/SW Contact  Terrial Rhodes, [place] Phone Number: 09/26/2020, 3:37 PM  Clinical Narrative:     CSW called Clydie Braun with [facility] to follow up on referral sent over. CSW awaiting callback.CSW following trach settings to see if patient stays at 28% for 48 hours. If patients stays at 28% for 48 hours CSW can fax out patient to [facility]. CSW spoke with patients significant other who is in agreement for CSW to fax out to [place], [place], and [place] close to patient being medically ready. CSW will continue to follow and assist with dc planning needs.  Expected Discharge Plan:  (Vent/SNF) Barriers to Discharge: Continued Medical Work up  Expected Discharge Plan and Services Expected Discharge Plan:  (Vent/SNF) In-house Referral: Clinical Social Work     Living arrangements for the past 2 months: Single Family Home                                       Social Determinants of Health (SDOH) Interventions    Readmission Risk Interventions No flowsheet data found.

## 2020-09-27 DIAGNOSIS — Z978 Presence of other specified devices: Secondary | ICD-10-CM | POA: Diagnosis not present

## 2020-09-27 DIAGNOSIS — Z9689 Presence of other specified functional implants: Secondary | ICD-10-CM | POA: Diagnosis not present

## 2020-09-27 DIAGNOSIS — K9413 Enterostomy malfunction: Secondary | ICD-10-CM | POA: Diagnosis not present

## 2020-09-27 DIAGNOSIS — I442 Atrioventricular block, complete: Secondary | ICD-10-CM | POA: Diagnosis not present

## 2020-09-27 LAB — GLUCOSE, CAPILLARY
Glucose-Capillary: 102 mg/dL — ABNORMAL HIGH (ref 70–99)
Glucose-Capillary: 102 mg/dL — ABNORMAL HIGH (ref 70–99)
Glucose-Capillary: 102 mg/dL — ABNORMAL HIGH (ref 70–99)
Glucose-Capillary: 107 mg/dL — ABNORMAL HIGH (ref 70–99)
Glucose-Capillary: 72 mg/dL (ref 70–99)
Glucose-Capillary: 79 mg/dL (ref 70–99)
Glucose-Capillary: 91 mg/dL (ref 70–99)

## 2020-09-27 LAB — BASIC METABOLIC PANEL
Anion gap: 5 (ref 5–15)
BUN: 14 mg/dL (ref 6–20)
CO2: 28 mmol/L (ref 22–32)
Calcium: 8.8 mg/dL — ABNORMAL LOW (ref 8.9–10.3)
Chloride: 99 mmol/L (ref 98–111)
Creatinine, Ser: 0.4 mg/dL — ABNORMAL LOW (ref 0.61–1.24)
GFR, Estimated: >60 mL/min (ref >60)
Glucose, Bld: 117 mg/dL — ABNORMAL HIGH (ref 70–99)
Potassium: 4.1 mmol/L (ref 3.5–5.1)
Sodium: 132 mmol/L — ABNORMAL LOW (ref 135–145)

## 2020-09-27 LAB — CBC
HCT: 31.1 % — ABNORMAL LOW (ref 39.0–52.0)
Hemoglobin: 9.9 g/dL — ABNORMAL LOW (ref 13.0–17.0)
MCH: 29.9 pg (ref 26.0–34.0)
MCHC: 31.8 g/dL (ref 30.0–36.0)
MCV: 94 fL (ref 80.0–100.0)
Platelets: 737 10*3/uL — ABNORMAL HIGH (ref 150–400)
RBC: 3.31 MIL/uL — ABNORMAL LOW (ref 4.22–5.81)
RDW: 16.1 % — ABNORMAL HIGH (ref 11.5–15.5)
WBC: 18.1 10*3/uL — ABNORMAL HIGH (ref 4.0–10.5)
nRBC: 0 % (ref 0.0–0.2)

## 2020-09-27 LAB — HEPATIC FUNCTION PANEL
ALT: 35 U/L (ref 0–44)
AST: 26 U/L (ref 15–41)
Albumin: 2.2 g/dL — ABNORMAL LOW (ref 3.5–5.0)
Alkaline Phosphatase: 93 U/L (ref 38–126)
Bilirubin, Direct: 0.2 mg/dL (ref 0.0–0.2)
Indirect Bilirubin: 0.3 mg/dL (ref 0.3–0.9)
Total Bilirubin: 0.5 mg/dL (ref 0.3–1.2)
Total Protein: 5.8 g/dL — ABNORMAL LOW (ref 6.5–8.1)

## 2020-09-27 MED ORDER — PROSOURCE TF PO LIQD
45.0000 mL | Freq: Two times a day (BID) | ORAL | Status: DC
  Administered 2020-09-27 – 2020-10-11 (×28): 45 mL
  Filled 2020-09-27 (×28): qty 45

## 2020-09-27 MED ORDER — HYDROMORPHONE HCL 1 MG/ML IJ SOLN
0.5000 mg | INTRAMUSCULAR | Status: DC | PRN
  Administered 2020-09-29 – 2020-10-04 (×10): 0.5 mg via INTRAVENOUS
  Filled 2020-09-27 (×10): qty 0.5

## 2020-09-27 MED ORDER — HYDROMORPHONE HCL 2 MG PO TABS
2.0000 mg | ORAL_TABLET | ORAL | Status: DC | PRN
  Administered 2020-09-27 – 2020-09-28 (×7): 2 mg via ORAL
  Filled 2020-09-27 (×7): qty 1

## 2020-09-27 MED ORDER — VITAL 1.5 CAL PO LIQD
1000.0000 mL | ORAL | Status: DC
  Administered 2020-09-28 – 2020-10-03 (×5): 1000 mL
  Filled 2020-09-27 (×7): qty 1000

## 2020-09-27 NOTE — Progress Notes (Signed)
Progress Note  Patient Name: Ralph [name] Date of Encounter: 09/27/2020  Primary Cardiologist: Buford Dresser, MD  Subjective   No issues overnight- no long pauses. Appreciate EP recs - airway issues leading to vagal response likely responsible for his bradycardia. If his trach is ever decannulated, this would likely resolve.  Inpatient Medications    Scheduled Meds:  chlorhexidine gluconate (MEDLINE KIT)  15 mL Mouth Rinse BID   Chlorhexidine Gluconate Cloth  6 each Topical Daily   clonazePAM  1 mg Per Tube BID   docusate  100 mg Per Tube BID   enoxaparin (LOVENOX) injection  40 mg Subcutaneous QHS   escitalopram  10 mg Per Tube QHS   feeding supplement (PROSource TF)  45 mL Per Tube TID   Gerhardt's butt cream   Topical QID   insulin aspart  0-24 Units Subcutaneous Q4H   liver oil-zinc oxide   Topical BID   mouth rinse  15 mL Mouth Rinse 10 times per day   melatonin  10 mg Per Tube QHS   metoCLOPramide (REGLAN) injection  10 mg Intravenous Q6H   midodrine  10 mg Per J Tube TID   pantoprazole (PROTONIX) IV  40 mg Intravenous Q24H   polyethylene glycol  17 g Per Tube BID   QUEtiapine  50 mg Per Tube BID   sennosides  10 mL Per Tube BID   sodium chloride flush  10 mL Intracatheter Q8H   sodium chloride flush  10-40 mL Intracatheter Q12H   sorbitol  60 mL Per Tube Q0600   tobramycin (PF)  300 mg Nebulization BID   Continuous Infusions:  sodium chloride 250 mL (09/27/20 0625)   feeding supplement (VITAL 1.5 CAL) 60 mL/hr at 09/27/20 0600   meropenem (MERREM) IV 1 g (09/27/20 0619)   vancomycin 750 mg (09/27/20 0627)   PRN Meds: sodium chloride, acetaminophen (TYLENOL) oral liquid 160 mg/5 mL, albuterol, atropine, HYDROmorphone (DILAUDID) injection, HYDROmorphone, sodium chloride flush   Vital Signs    Vitals:   09/27/20 0600 09/27/20 0700 09/27/20 0748 09/27/20 0755  BP: 107/63 108/61    Pulse: 65 (!) 57  66  Resp: 19 18  (!) 21  Temp:   97.9 F (36.6 C)    TempSrc:   Oral   SpO2: 96% 94%  95%  Weight:      Height:        Intake/Output Summary (Last 24 hours) at 09/27/2020 0925 Last data filed at 09/27/2020 0600 Gross per 24 hour  Intake 1864.57 ml  Output 1850 ml  Net 14.57 ml   Filed Weights   09/25/20 0427 09/26/20 0500 09/27/20 0500  Weight: 53.9 kg 53.2 kg 53.6 kg    Telemetry    Sinus rhythm - some bradycardia, short pauses-personally reviewed  ECG   N/A  Physical Exam   GEN: No acute distress, somnolent, on trach collar Neck: No JVD. Cardiac: RRR, no gallop.  Respiratory: Nonlabored.  Breathing comfortably on ventilator via trach tube. GI: Soft, nontender, bowel sounds present. MS: No edema.  Labs    Chemistry Recent Labs  Lab 09/22/20 0223 09/23/20 0440 09/25/20 0423 09/26/20 0350 09/27/20 0611  NA 137   < > 136 135 132*  K 4.6   < > 4.2 4.4 4.1  CL 106   < > 102 101 99  CO2 27   < > 28 29 28   GLUCOSE 103*   < > 127* 117* 117*  BUN 24*   < >  17 14 14   CREATININE 0.37*   < > 0.35* 0.41* 0.40*  CALCIUM 8.6*   < > 8.8* 9.0 8.8*  PROT 5.9*  --   --  6.1* 5.8*  ALBUMIN 1.9*  --   --  2.2* 2.2*  AST 48*  --   --  25 26  ALT 61*  --   --  35 35  ALKPHOS 146*  --   --  98 93  BILITOT 0.6  --   --  0.6 0.5  GFRNONAA >60   < > >60 >60 >60  ANIONGAP 4*   < > 6 5 5    < > = values in this interval not displayed.     Hematology Recent Labs  Lab 09/25/20 0423 09/26/20 0350 09/27/20 0611  WBC 21.1* 20.6* 18.1*  RBC 3.32* 3.37* 3.31*  HGB 9.9* 10.1* 9.9*  HCT 31.0* 32.3* 31.1*  MCV 93.4 95.8 94.0  MCH 29.8 30.0 29.9  MCHC 31.9 31.3 31.8  RDW 15.5 15.9* 16.1*  PLT 775* 726* 737*    Radiology    DG Chest Port 1 View  Result Date: 09/26/2020 CLINICAL DATA:  Empyema. EXAM: PORTABLE CHEST 1 VIEW COMPARISON:  09/23/2020 and chest CT 09/24/2020 FINDINGS: Left pigtail chest tube has been removed. There is 1 large bore right chest tube with the tip near the right lung apex. Right arm PICC line tip is  near the superior cavoatrial junction. Patchy parenchymal densities in the mid and lower right chest are unchanged. Negative for right pneumothorax. Rounded density at the left lung base is likely related to pleural densities. Residual patchy densities at the left lung base probably related to combination of pleural and parenchymal densities. Focal lucency near the left costophrenic angle may represent a small pocket of pleural air. Heart size is within normal limits. Patient has a tracheostomy tube. IMPRESSION: 1. Residual pleural and parenchymal densities in the left lower chest. Left chest tube has been removed. Suspect a small amount of loculated left pleural air in left lower chest. 2. A right chest tube is still present. Negative for right pneumothorax. Persistent patchy densities in the right lower chest. Electronically Signed   By: Markus Daft M.D.   On: 09/26/2020 08:22     Assessment & Plan    1.  Sinus bradycardia and transient high degree heart block. Overnight continued to have significant bradycardia with HR's in the 30's and multiple episodes of asystole. The longest episode was 6.38 seconds. He is not on any AV nodal blockers.  LVEF normal by echocardiogram. No indication for pacing at this time.  2.  Status post esophagectomy complicated by postoperative esophageal rupture requiring revision.  3.  Acute hypoxic/hypercarbic respiratory failure requiring mechanical ventilation and tracheostomy.  He has an associated Pseudomonas pneumonia and loculated left pleural effusion with chest tube and on broad-spectrum antibiotics per CCM.  Pixie Casino, MD, [facility], [place] Director of the Advanced Lipid Disorders &  Cardiovascular Risk Reduction Clinic Diplomate of the American Board of Clinical Lipidology Attending Cardiologist  Direct Dial: [phone]  Fax: [phone]  Website:  [email or web address]  Pixie Casino, MD  09/27/2020, 9:25 AM

## 2020-09-27 NOTE — TOC Progression Note (Signed)
Transition of Care [facility]) - Progression Note    Patient Details  Name: Ralph [name] MRN: [id number] Date of Birth: [birth year]  Transition of [facility]) CM/SW Contact  Terrial Rhodes, LCSWA Phone Number: 09/27/2020, 4:39 PM  Clinical Narrative:     CSW following to fax out for trach SNF placement. CSW will fax out for trach SNF placement tomorrow if patients trach is still at 28%. CSW will continue to follow and assist with dc planning needs.  Expected Discharge Plan:  (Vent/SNF) Barriers to Discharge: Continued Medical Work up  Expected Discharge Plan and Services Expected Discharge Plan:  (Vent/SNF) In-house Referral: Clinical Social Work     Living arrangements for the past 2 months: Single Family Home                                       Social Determinants of Health (SDOH) Interventions    Readmission Risk Interventions No flowsheet data found.

## 2020-09-27 NOTE — Progress Notes (Signed)
      [address]       [name] [address]             [phone]      Asleep at present  Remains on TC  BP (!) 95/56   Pulse 68   Temp 97.9 F (36.6 C) (Oral)   Resp 16   Ht 5\' 8"  (1.727 m)   Wt 53.6 kg   SpO2 96%   BMI 17.97 kg/m    Intake/Output Summary (Last 24 hours) at 09/27/2020 1844 Last data filed at 09/27/2020 1831 Gross per 24 hour  Intake 2255.55 ml  Output 2400 ml  Net -144.45 ml    Continue current Rx.  09/29/2020 Salvatore Decent, MD [facility] [phone]

## 2020-09-27 NOTE — Progress Notes (Signed)
NAME:  Ralph [name], MRN:  [id number], DOB:  12-Jan-1966, LOS: 26 ADMISSION DATE:  09/01/2020, CONSULTATION DATE:  8/3 REFERRING MD:  Dr. Cliffton Asters, CHIEF COMPLAINT:  esophageal rupture, esophageal stricture    History of Present Illness:  Patient is a 55 yo M w/ PMH anxiety, CAD, MI, stroke presents to [facility] on 7/28 for esophagectomy w/ Dr. Cliffton Asters for esophageal stricture.  On 7/28, Dr. Cliffton Asters performed the esophagectomy without complication.  8/3 patient taken back to OR for esophageal rupture s/p esophagectomy by Dr. Cliffton Asters. Unable to extubate post procedure. Patient remains intubated on mechanical ventilation. ABG with respiratory acidosis.    PCCM consulted for vent management. He remains critically ill  Pertinent  Medical History   Past Medical History:  Diagnosis Date   Anxiety    Coronary artery disease    Myocardial infarction [facility])    Stroke (HCC)     Significant Hospital Events: Including procedures, antibiotic start and stop dates in addition to other pertinent events   7/28 admitted for planned esophagectomy. Chest tube was placed on right chest. J tube in place. 2 JP drains in place. Patient was extubated post procedure. Patient developed subcutaneous emphysema. Unable to use J tube due to leakage around site. 8/3: PCCM consulted for vent management after pt brought back to OR for esophageal rupture/ leak. Vanc/zosyn/fluconazole started. Hypotension noted post-op. Felt 2/2 Sepsis; treated w/ volume and pressors.  8/4: Still on norepinephrine.  Central venous pressure suggesting he is still hypovolemic so receiving fluid.  Spontaneous breathing trial initiated but not ready for extubation due to work of breathing.  culture sent from chest tube.  J-tube replaced in interventional radiology due to leaking.  Tube feeds resumed in the afternoon 8/5: J-tube site again leaking, tube feeds placed on hold around midnight.Still pressor dependent . Passed SBT. WOB improved.   Failed extubation and was reintubated 8/8: J-tube upsized 8/9: Tracheostomy 8/10 J-tube continues to leak. TF stopped. RT IJ CVC removed. 8/13 starting trickle feeds 8/15 bronch 8/16 pigtail to L fluid collection 8/19 Left Chest Tube removed  8/22 Switch dilaudid to PRN  8/23 Discontinue dilaudid, clonazepam, seroquel  Antibiotics: Cefoxitin 7/28-7/29 Fluconazole 8/3>>8/17 Gentamicin 8/3 Zosyn 8/3>8/10 Vancomycin 8/3-8/7  Ceftazidime 8/10>> Tobramycin nebs 8/10>> Flagyl 8/16 >>8/19 Vanc 8/19 >>  Meropenem 8/19 >>  Interim History / Subjective:  Patient is doing well today and did well overnight. Continues to have bradycardic episodes most likely secondary to coughing episodes.  Objective   Blood pressure 108/61, pulse 66, temperature 97.9 F (36.6 C), temperature source Oral, resp. rate (!) 21, height 5\' 8"  (1.727 m), weight 53.6 kg, SpO2 95 %.    FiO2 (%):  [25 %-28 %] 28 %   Intake/Output Summary (Last 24 hours) at 09/27/2020 1142 Last data filed at 09/27/2020 1100 Gross per 24 hour  Intake 2557.18 ml  Output 2450 ml  Net 107.18 ml    Filed Weights   09/25/20 0427 09/26/20 0500 09/27/20 0500  Weight: 53.9 kg 53.2 kg 53.6 kg    Examination: Cachetic man, responsive to sound and attempts to speak Lungs with rhonci overall improved, triggering vent Abdomen soft, R JP drain in place, L pigtail out, has JP on R draining esophagectomy site Muscle wasting without edema  WBC decreasing (20.6 --> 18.1)  Plts up   Resolved Hospital Problem list   Constipation   Assessment & Plan:  Acute respiratory failure w/ hypoxia and hypercarbia requiring mechanical ventilation. s/p tracheostomy (8/9) HAP- Pan-sensitive Pseudomonas pneumonia  L loculated effusion, pneumonia with necrosis-  s/p Chest Tube (8/16), culture with prevotella, enterococcus; pigtail out 8/20 Rising white count -  White count decreasing (20.6 --> 18.1). Patient still afebrile and chest X ray still  exhibiting residual pleural and parenchymal densities in the left lower chest.   Plan  - Vanc/meropenem x 10-14 days, plan to stop 8/25 - Trial of 24/7 trach collar  Esophageal stricture s/p esophagectomy complicated by post operative esophageal rupture and J-tube leakage Severe protein calorie malnutrition, predates surgery   Leaking J tube- intermittent issue throughout stay, CT a/p looks okay position.  J-tube upsized 8/8. Tube feeding stopped again on 8/11 due to leak around J tube, yellow/green drainage overnight per nursing. 8/13 restarted trickle tube feeds, but stopped due to pain. Pain did not resolve after TF cessation and TF restarted 8/14.  Constipation- improved, large BM 8/20 Plan - Continue reglan - Continue miralax, sennosides, sorbitol, and docusate  - Continue TF - JP management per TCTS - Plan to switch from cuffed to uncuffed trach - PT/OT swallow study  Bradycardia, QTC 390, question vagal events Consulted with nurse, and vagal events most likely secondary to suction and coughing events. Will plan to keep trach in longer and switch to uncuffed. Plan -Cardiology Consulted >> stopped dobutamine 8/17 -Atropine PRN if prolonged  - Continue to monitor episodes of bradycardia - Consider midodrine as cause if bradycardia continues although not likely   Pressure Wound - Sacrum -Continue q2h turn -Continue foam dressing  Depression- continue lexapro; seroquel discontniued  Best Practice (right click and "Reselect all SmartList Selections" daily)   Diet/type: TF DVT prophylaxis: LMWH GI prophylaxis: PPI Lines: NG, J tube, PICC Foley:  Condom cath Code Status:  full code Last date of multidisciplinary goals of care discussion: goal is LTACH  Rashan Rounsaville Anne Shutter, MS4

## 2020-09-27 NOTE — Progress Notes (Signed)
Nutrition Follow-up  DOCUMENTATION CODES:   Underweight, Severe malnutrition in context of chronic illness  INTERVENTION:   Tube Feeding via J-tube: Vital 1.5 at 70 ml/hr Pro-Stat 30 mL BID Provides 2600 kcals, 135 g of protein, 1277 mL of free water  Recommend considering reducing bowel regimen as pt with type 7 BMs (last one yesterda); currently on sorbitol, senokot, miralax and colace in addition to reglan  NUTRITION DIAGNOSIS:   Severe Malnutrition related to chronic illness (recurrent esophageal stricture s/p J-tube) as evidenced by severe muscle depletion, severe fat depletion.  Being addressed via TF  GOAL:   Patient will meet greater than or equal to 90% of their needs  Progressing  MONITOR:   Vent status, Labs, Weight trends, Skin, I & O's, Other (Comment) (TPN)  REASON FOR ASSESSMENT:   Consult Enteral/tube feeding initiation and management  ASSESSMENT:   55 year old male who presented on 7/28 for esophagogastroduodenoscopy and robotic assisted esophagectomy. PMH of esophageal stricture s/p multiple dilations and J-tube placement in April 2022, stroke, CAD, MI, anxiety.  CSW working on placement; vent/trach SNF vs trach SNF. Currently tolerating 28% TC  Tolerating Vital 1.5 at 60 ml/hr via J-tube  Hypoglycemia noted overnight, does not appear that TF was held during this time  Weight remains relatively stable  +type 7 large BM; pt on significant bowel regimen  Labs: sodium 132 (L) Meds: ss novolog, senna, colace, miralax, sorbitol, reglan    Diet Order:   Diet Order             Diet NPO time specified  Diet effective midnight                   EDUCATION NEEDS:   No education needs have been identified at this time  Skin:  Skin Assessment: Skin Integrity Issues: Skin Integrity Issues:: Stage II DTI: n/a Stage II: sacrum Incisions: abdoment, R chest x 2  Last BM:  8/22 large type 7  Height:   Ht Readings from Last 1 Encounters:   09/07/20 5\' 8"  (1.727 m)    Weight:   Wt Readings from Last 1 Encounters:  09/27/20 53.6 kg    BMI:  Body mass index is 17.97 kg/m.  Estimated Nutritional Needs:   Kcal:  2300-2600 kcals  Protein:  115-135 g  Fluid:  >/= 2.0 L  09/29/20 MS, RDN, LDN, CNSC Registered Dietitian III Clinical Nutrition RD Pager and On-Call Pager Number Located in [place]

## 2020-09-27 NOTE — Consult Note (Signed)
WOC Nurse wound follow up Wound type: Stage 2 Pressure Injury is healing; 1X1X.1cm to sacrum, pink and dry.  Continue present plan of care as follows: Clean the sacral area with no rinse cleanser, pat dry and apply a layer of Gerhardts directly on the open wound then replace the sacral foam. Apply QID or PRN soiling.  Please re-consult if further assistance is needed.  Thank-you,  Cammie Mcgee MSN, RN, CWOCN, [place], CNS [phone]

## 2020-09-27 NOTE — Progress Notes (Signed)
      [address]       [place] [address]             [phone]                 20 Days Post-Op Procedure(s) (LRB): XI ROBOTIC ASSISTED THORASCOPY-DECORTICATION & REVISION OF ESOPHAGECTOMY (Right) ESOPHAGOGASTRODUODENOSCOPY (EGD) (N/A) INTERCOSTAL NERVE BLOCK (Right)   Events: Trac collar 48hrs  _______________________________________________________________ Vitals: BP 108/61   Pulse 66   Temp 97.9 F (36.6 C) (Oral)   Resp (!) 21   Ht 5\' 8"  (1.727 m)   Wt 53.6 kg   SpO2 95%   BMI 17.97 kg/m   - Neuro: awake  - Cardiovascular: Sinus  Drips:       - Pulm: Trach collar    FiO2 (%):  [25 %-28 %] 28 %  ABG    Component Value Date/Time   PHART 7.414 09/08/2020 0444   PCO2ART 32.8 09/08/2020 0444   PO2ART 98 09/08/2020 0444   HCO3 20.7 09/08/2020 0444   TCO2 22 09/08/2020 0444   ACIDBASEDEF 3.0 (H) 09/08/2020 0444   O2SAT 97.0 09/08/2020 0444    - Abd: Soft - Extremity: Cool  .Intake/Output      08/22 0701 08/23 0700 08/23 0701 08/24 0700   I.V. (mL/kg) 240 (4.5)    Other     NG/GT 1121.1    IV Piggyback 503.5    Total Intake(mL/kg) 1864.6 (34.8)    Urine (mL/kg/hr) 1850 (1.4)    Drains     Stool 0    Total Output 1850    Net +14.6         Stool Occurrence 1 x       _______________________________________________________________ Labs: CBC Latest Ref Rng & Units 09/27/2020 09/26/2020 09/25/2020  WBC 4.0 - 10.5 K/uL 18.1(H) 20.6(H) 21.1(H)  Hemoglobin 13.0 - 17.0 g/dL 09/27/2020) 10.1(L) 9.9(L)  Hematocrit 39.0 - 52.0 % 31.1(L) 32.3(L) 31.0(L)  Platelets 150 - 400 K/uL 737(H) 726(H) 775(H)   CMP Latest Ref Rng & Units 09/27/2020 09/26/2020 09/25/2020  Glucose 70 - 99 mg/dL 09/27/2020) 973(Z) 329(J)  BUN 6 - 20 mg/dL 14 14 17   Creatinine 0.61 - 1.24 mg/dL 242(A) ) 8.34(H)  Sodium 135 - 145 mmol/L 132(L) 135 136  Potassium 3.5 - 5.1 mmol/L 4.1 4.4 4.2  Chloride 98 - 111 mmol/L 99 101 102  CO2 22 - 32 mmol/L 28 29 28   Calcium  8.9 - 10.3 mg/dL 9.62(I) 9.0 2.97(L)  Total Protein 6.5 - 8.1 g/dL ) 6.1(L) -  Total Bilirubin 0.3 - 1.2 mg/dL 0.5 0.6 -  Alkaline Phos 38 - 126 U/L 93 98 -  AST 15 - 41 U/L 26 25 -  ALT 0 - 44 U/L 35 35 -    CXR: -  _______________________________________________________________  Assessment and Plan: POD 25 s/p robotic Ivor Lewis esophagectomy, postop day 19 status post reexploration with reinforcement of anastomosis.  Neuro: pain controlled CV: brady episodes intermittently.  Asymptomatic.  Will continue to monitor Pulm: pigtail removed.  Will keep L drain until swallow Renal: Creatinine stable. GI:  full dose tube feeds  Heme: Stable ID: WBC up.  On abx.  Will follow Endo: Sliding scale insulin Dispo: 2C soon.  Will plan for SNF/LTAC    8.9(Q 09/27/2020 8:38 AM

## 2020-09-28 ENCOUNTER — Inpatient Hospital Stay (HOSPITAL_COMMUNITY): Payer: Medicaid Other

## 2020-09-28 LAB — BASIC METABOLIC PANEL
Anion gap: 4 — ABNORMAL LOW (ref 5–15)
BUN: 14 mg/dL (ref 6–20)
CO2: 28 mmol/L (ref 22–32)
Calcium: 8.8 mg/dL — ABNORMAL LOW (ref 8.9–10.3)
Chloride: 102 mmol/L (ref 98–111)
Creatinine, Ser: 0.35 mg/dL — ABNORMAL LOW (ref 0.61–1.24)
GFR, Estimated: >60 mL/min (ref >60)
Glucose, Bld: 125 mg/dL — ABNORMAL HIGH (ref 70–99)
Potassium: 4.2 mmol/L (ref 3.5–5.1)
Sodium: 134 mmol/L — ABNORMAL LOW (ref 135–145)

## 2020-09-28 LAB — CBC
HCT: 33.3 % — ABNORMAL LOW (ref 39.0–52.0)
Hemoglobin: 10.9 g/dL — ABNORMAL LOW (ref 13.0–17.0)
MCH: 30.3 pg (ref 26.0–34.0)
MCHC: 32.7 g/dL (ref 30.0–36.0)
MCV: 92.5 fL (ref 80.0–100.0)
Platelets: 778 10*3/uL — ABNORMAL HIGH (ref 150–400)
RBC: 3.6 MIL/uL — ABNORMAL LOW (ref 4.22–5.81)
RDW: 16.4 % — ABNORMAL HIGH (ref 11.5–15.5)
WBC: 29.2 10*3/uL — ABNORMAL HIGH (ref 4.0–10.5)
nRBC: 0 % (ref 0.0–0.2)

## 2020-09-28 LAB — HEPATIC FUNCTION PANEL
ALT: 36 U/L (ref 0–44)
AST: 27 U/L (ref 15–41)
Albumin: 2.4 g/dL — ABNORMAL LOW (ref 3.5–5.0)
Alkaline Phosphatase: 96 U/L (ref 38–126)
Bilirubin, Direct: 0.2 mg/dL (ref 0.0–0.2)
Indirect Bilirubin: 0.5 mg/dL (ref 0.3–0.9)
Total Bilirubin: 0.7 mg/dL (ref 0.3–1.2)
Total Protein: 6.2 g/dL — ABNORMAL LOW (ref 6.5–8.1)

## 2020-09-28 LAB — GLUCOSE, CAPILLARY
Glucose-Capillary: 100 mg/dL — ABNORMAL HIGH (ref 70–99)
Glucose-Capillary: 104 mg/dL — ABNORMAL HIGH (ref 70–99)
Glucose-Capillary: 116 mg/dL — ABNORMAL HIGH (ref 70–99)

## 2020-09-28 MED ORDER — ACETAMINOPHEN 160 MG/5ML PO SOLN
650.0000 mg | ORAL | Status: DC | PRN
  Administered 2020-09-28 – 2020-10-17 (×32): 650 mg
  Filled 2020-09-28 (×33): qty 20.3

## 2020-09-28 MED ORDER — SENNOSIDES 8.8 MG/5ML PO SYRP: 10.0000 mL | ORAL_SOLUTION | Freq: Every evening | ORAL | Status: DC | PRN

## 2020-09-28 MED ORDER — HYDROMORPHONE HCL 2 MG PO TABS
4.0000 mg | ORAL_TABLET | ORAL | Status: DC | PRN
Start: 2020-09-28 — End: 2020-10-04
  Administered 2020-09-29 – 2020-10-03 (×25): 4 mg via ORAL
  Filled 2020-09-28 (×26): qty 2

## 2020-09-28 MED ORDER — POLYETHYLENE GLYCOL 3350 17 G PO PACK: 17.0000 g | PACK | Freq: Every day | ORAL | Status: DC | PRN

## 2020-09-28 NOTE — Progress Notes (Addendum)
Physical Therapy Treatment Patient Details Name: Ralph [name] MRN: [id number] DOB: 1965-07-01 Today's Date: 09/28/2020    History of Present Illness 55 y/o male admitted 7/28 for an Ivor-Lewis esophagectomy due to benign lower esophageal stricture and protein malnutrition.   8/3 pt returned to OR for esophageal rupture with exploration and repair with Jtube placement.  8/9 Tracheostomy.  PMHx: anxiety, CAD, MI, Stroke, multiple balloon dilations to esophageal stricture.    PT Comments    Session limited due to pt's own self limitations.  Pt did use the PMV throughout and vocalized what he would do/not dow and his wants.  Plan was to work on standing activity, progress from there and end in the chair, but pt accepted on exercise and transition to EOB, before shutting down, saying he couldn't then wouldn't do more.    Follow Up Recommendations  LTACH;Supervision/Assistance - 24 hour     Equipment Recommendations  Rolling walker with 5" wheels;Hospital bed;Other (comment) (TBA further)    Recommendations for Other Services       Precautions / Restrictions Precautions Precautions: Fall    Mobility  Bed Mobility Overal bed mobility: Needs Assistance Bed Mobility: Rolling;Sidelying to Sit;Sit to Supine Rolling: Min guard Sidelying to sit: Mod assist   Sit to supine: Min assist   General bed mobility comments: rolled to decrease potential for pain, truncal assist up via R elbow.  pt able to scoot to the EOB without assist, but refused to go further.    Transfers                 General transfer comment: pt refused transfers, standing or to the chair.  Ambulation/Gait                 Stairs             Wheelchair Mobility    Modified Rankin (Stroke Patients Only)       Balance Overall balance assessment: Needs assistance Sitting-balance support: No upper extremity supported Sitting balance-Leahy Scale: Fair Sitting balance - Comments: kyphotic,  prefers use of UE's, but not needed today.                                    Cognition Arousal/Alertness: Awake/alert Behavior During Therapy: Flat affect Overall Cognitive Status: Within Functional Limits for tasks assessed (NT formally)                                 General Comments: pt speaking well with PMV      Exercises Other Exercises Other Exercises: bil hip/knee flexion/ext ROM exercise AROM and graded resistance in extension x 10 reps prior to mobility.    General Comments General comments (skin integrity, edema, etc.): vss overall on 28% FiO2 on TC      Pertinent Vitals/Pain Pain Assessment: Faces Faces Pain Scale: Hurts even more Pain Location: across his chest Pain Descriptors / Indicators: Sore Pain Intervention(s): Limited activity within patient's tolerance;Monitored during session    Home Living                      Prior Function            PT Goals (current goals can now be found in the care plan section) Acute Rehab PT Goals Time For Goal Achievement: 09/30/20 Potential to Achieve Goals:  Fair Progress towards PT goals: Not progressing toward goals - comment (self limiting when he could do much more.)    Frequency    Min 3X/week      PT Plan Current plan remains appropriate    Co-evaluation              AM-PAC PT "6 Clicks" Mobility   Outcome Measure  Help needed turning from your back to your side while in a flat bed without using bedrails?: A Lot Help needed moving from lying on your back to sitting on the side of a flat bed without using bedrails?: A Lot Help needed moving to and from a bed to a chair (including a wheelchair)?: A Lot Help needed standing up from a chair using your arms (e.g., wheelchair or bedside chair)?: A Lot Help needed to walk in hospital room?: Total Help needed climbing 3-5 steps with a railing? : Total 6 Click Score: 10    End of Session Equipment Utilized  During Treatment: Oxygen Activity Tolerance: Patient tolerated treatment well;Other (comment) (self limiting) Patient left: in bed;with bed alarm set;with call bell/phone within reach Nurse Communication: Mobility status PT Visit Diagnosis: Other abnormalities of gait and mobility (R26.89);Muscle weakness (generalized) (M62.81);Difficulty in walking, not elsewhere classified (R26.2) Pain - part of body:  (across chest.)     Time: 1352-1410 PT Time Calculation (min) (ACUTE ONLY): 18 min  Charges:  $Therapeutic Activity: 8-22 mins                     09/28/2020  Jacinto Halim., PT Acute Rehabilitation Services [phone]  (pager) [phone]  (office)   Eliseo Gum Shadow Schedler 09/28/2020, 3:14 PM

## 2020-09-28 NOTE — Progress Notes (Signed)
   No further cardiac recommendations at this time - pauses appear to be improving. Not thought to require pacing.  Will sign-off. Call with questions.  [facility] will sign off.   Medication Recommendations:  continue current meds Other recommendations (labs, testing, etc):  None Follow up as an outpatient:  Dr. Tyrone Nine, MD, [facility], FACP  [place]  [facility]  Medical Director of the [facility] Lipid [facility] &  Cardiovascular Risk Reduction Clinic Diplomate of the American Board of Clinical Lipidology Attending Cardiologist  Direct Dial: [phone]  Fax: [phone]  Website:  [email or web address]

## 2020-09-28 NOTE — Progress Notes (Addendum)
NAME:  CUINN WESTERHOLD, MRN:  [id number], DOB:  1965-07-27, LOS: 27 ADMISSION DATE:  09/01/2020, CONSULTATION DATE:  8/3 REFERRING MD:  Dr. Cliffton Asters, CHIEF COMPLAINT:  esophageal rupture, esophageal stricture    History of Present Illness:  Patient is a 55 yo M w/ PMH anxiety, CAD, MI, stroke presents to [facility] on 7/28 for esophagectomy w/ Dr. Cliffton Asters for esophageal stricture.  On 7/28, Dr. Cliffton Asters performed the esophagectomy without complication.  8/3 patient taken back to OR for esophageal rupture s/p esophagectomy by Dr. Cliffton Asters.   Patient is improving and has been doing well on trach collar since 8/22. Pertinent  Medical History   Past Medical History:  Diagnosis Date   Anxiety    Coronary artery disease    Myocardial infarction [facility])    Stroke (HCC)     Significant Hospital Events: Including procedures, antibiotic start and stop dates in addition to other pertinent events   7/28 admitted for planned esophagectomy. Chest tube was placed on right chest. J tube in place. 2 JP drains in place. Patient was extubated post procedure. Patient developed subcutaneous emphysema. Unable to use J tube due to leakage around site. 8/3: PCCM consulted for vent management after pt brought back to OR for esophageal rupture/ leak. Vanc/zosyn/fluconazole started. Hypotension noted post-op. Felt 2/2 Sepsis; treated w/ volume and pressors.  8/4: Still on norepinephrine.  Central venous pressure suggesting he is still hypovolemic so receiving fluid.  Spontaneous breathing trial initiated but not ready for extubation due to work of breathing.  culture sent from chest tube.  J-tube replaced in interventional radiology due to leaking.  Tube feeds resumed in the afternoon 8/5: J-tube site again leaking, tube feeds placed on hold around midnight.Still pressor dependent . Passed SBT. WOB improved.  Failed extubation and was reintubated 8/8: J-tube upsized 8/9: Tracheostomy 8/10 J-tube continues to leak. TF  stopped. RT IJ CVC removed. 8/13 starting trickle feeds 8/15 bronch 8/16 pigtail to L fluid collection 8/19 Left Chest Tube removed  8/22 Switch dilaudid to PRN  8/23 Discontinue dilaudid, clonazepam, seroquel 8/24 Initiate swallow study  Antibiotics: Cefoxitin 7/28-7/29 Fluconazole 8/3>>8/17 Gentamicin 8/3 Zosyn 8/3>8/10 Vancomycin 8/3-8/7  Ceftazidime 8/10>> Tobramycin nebs 8/10>> Flagyl 8/16 >>8/19 Vanc 8/19 >>  Meropenem 8/19 >>  Interim History / Subjective:  Patient has some concerns about chest pain located right around his right chest tube. There is no irritation or erythema around the tube and the wound is clean. Patient has concerns about his quality of sleep due to his chest pain.  Objective   Blood pressure 103/67, pulse 72, temperature 97.9 F (36.6 C), temperature source Oral, resp. rate (!) 24, height 5\' 8"  (1.727 m), weight 55.2 kg, SpO2 100 %.    FiO2 (%):  [28 %-35 %] 28 %   Intake/Output Summary (Last 24 hours) at 09/28/2020 0856 Last data filed at 09/28/2020 0700 Gross per 24 hour  Intake 2471 ml  Output 2100 ml  Net 371 ml    Filed Weights   09/26/20 0500 09/27/20 0500 09/28/20 0700  Weight: 53.2 kg 53.6 kg 55.2 kg    Examination: Cachetic man, responsive and engaging.  Lungs with rhonci overall improved, triggering vent Abdomen soft, R JP drain in place, has JP on R draining esophagectomy site Muscle wasting without edema  WBC increased (20.6 --> 29.2)  Plts up 778 from 737   Resolved Hospital Problem list   Constipation   Assessment & Plan:  Acute respiratory failure w/ hypoxia and hypercarbia requiring  mechanical ventilation. s/p tracheostomy (8/9) HAP- Pan-sensitive Pseudomonas pneumonia L loculated effusion, pneumonia with necrosis:  s/p Chest Tube (8/16), culture with prevotella, enterococcus; pigtail out 8/20  Plan  - Vanc/meropenem x 10-14 days, plan to stop 8/25  Rising white count: White count increased to 29.2 from 18.1.  Patient afebrile and doing well clinically. Most likely a stress reaction.  Plan - Trend Wbc with CBC tomorrow - Repeat chest X ray if Wbc continues to increase    Esophageal stricture s/p esophagectomy complicated by post operative esophageal rupture and J-tube leakage: Intermittent issue throughout stay, CT a/p looks okay position.  J-tube upsized 8/8. Tube feeding stopped again on 8/11 due to leak around J tube, yellow/green drainage overnight per nursing. 8/13 restarted trickle tube feeds, but stopped due to pain. Pain did not resolve after TF cessation and TF restarted 8/14.  Minimal amount of J - tube leakage. 24/7 Trach collar trial since 8/22, patient doing well.  Plan - Continue 24/7 trach collar trial - Swallow study with speech, if successful, consider barium swallow study - If barium swallow successful, consider capping trial  - Inpatient rehabilitation     Severe protein calorie malnutrition, predates surgery    Plan - Continue aggressive enteral nutrition plan  Constipation: improved, large BM 8/20. Patient is now experiencing soft and watery stools and refuses to continue bowel regiment as a result.  Plan - d/c reglan  - continue miralax, sennosides, sorbitol, and docusate prn - Continue TF - JP management per TCTS   Bradycardia, QTC 390, question vagal events Consulted with nurse, and vagal events most likely secondary to suction and coughing events. No brady events secondary to suctioning or coughing episodes on 8/24.  Plan -Cardiology Consulted >> stopped dobutamine 8/17 -Atropine PRN if prolonged  - Continue to monitor episodes of bradycardia   Pressure Wound - Sacrum -Continue q2h turn -Continue foam dressing  Depression- continue lexapro; seroquel discontniued  Best Practice (right click and "Reselect all SmartList Selections" daily)   Diet/type: TF DVT prophylaxis: LMWH GI prophylaxis: PPI Lines: NG, J tube, PICC Foley:  Condom cath Code  Status:  full code Last date of multidisciplinary goals of care discussion: goal is LTACH  Derian Pfost Anne Shutter, MS4

## 2020-09-28 NOTE — Evaluation (Signed)
Clinical/Bedside Swallow Evaluation Patient Details  Name: RYLAN BERNARD MRN: [id number] Date of Birth: [birth year]  Today's Date: 09/28/2020 Time: SLP Start Time (ACUTE ONLY): 1149 SLP Stop Time (ACUTE ONLY): 1237 SLP Time Calculation (min) (ACUTE ONLY): 48 min  Past Medical History:  Past Medical History:  Diagnosis Date   Anxiety    Coronary artery disease    Myocardial infarction (HCC)    Stroke [facility])    Past Surgical History:  Past Surgical History:  Procedure Laterality Date   APPENDECTOMY     BALLOON DILATION N/A 06/01/2019   Procedure: BALLOON DILATION;  Surgeon: Vida Rigger, MD;  Location: [facility] ENDOSCOPY;  Service: Endoscopy;  Laterality: N/A;   BALLOON DILATION N/A 07/16/2019   Procedure: BALLOON DILATION;  Surgeon: Vida Rigger, MD;  Location: [facility] ENDOSCOPY;  Service: Endoscopy;  Laterality: N/A;   BALLOON DILATION N/A 09/09/2019   Procedure: BALLOON DILATION;  Surgeon: Vida Rigger, MD;  Location: [facility] ENDOSCOPY;  Service: Endoscopy;  Laterality: N/A;   BIOPSY  04/29/2019   Procedure: BIOPSY;  Surgeon: Charlott Rakes, MD;  Location: [facility] ENDOSCOPY;  Service: Endoscopy;;   BIOPSY  05/27/2019   Procedure: BIOPSY;  Surgeon: [name], MD;  Location: [facility] ENDOSCOPY;  Service: Endoscopy;;   BIOPSY  06/01/2019   Procedure: BIOPSY;  Surgeon: Vida Rigger, MD;  Location: [facility] ENDOSCOPY;  Service: Endoscopy;;   BIOPSY  07/16/2019   Procedure: BIOPSY;  Surgeon: Vida Rigger, MD;  Location: [facility] ENDOSCOPY;  Service: Endoscopy;;   BIOPSY  05/27/2020   Procedure: BIOPSY;  Surgeon: Napoleon Form, MD;  Location: [facility] ENDOSCOPY;  Service: Endoscopy;;   ESOPHAGOGASTRODUODENOSCOPY N/A 06/01/2019   Procedure: ESOPHAGOGASTRODUODENOSCOPY (EGD);  Surgeon: Vida Rigger, MD;  Location: [facility] ENDOSCOPY;  Service: Endoscopy;  Laterality: N/A;   ESOPHAGOGASTRODUODENOSCOPY N/A 05/31/2020   Procedure: ESOPHAGOGASTRODUODENOSCOPY (EGD);  Surgeon: Corliss Skains, MD;  Location: [facility] OR;  Service: Thoracic;   Laterality: N/A;   ESOPHAGOGASTRODUODENOSCOPY N/A 09/01/2020   Procedure: ESOPHAGOGASTRODUODENOSCOPY (EGD);  Surgeon: Corliss Skains, MD;  Location: [facility] OR;  Service: Thoracic;  Laterality: N/A;   ESOPHAGOGASTRODUODENOSCOPY N/A 09/07/2020   Procedure: ESOPHAGOGASTRODUODENOSCOPY (EGD);  Surgeon: Corliss Skains, MD;  Location: [facility] OR;  Service: Thoracic;  Laterality: N/A;   ESOPHAGOGASTRODUODENOSCOPY (EGD) WITH PROPOFOL N/A 04/29/2019   Procedure: ESOPHAGOGASTRODUODENOSCOPY (EGD) WITH PROPOFOL;  Surgeon: Charlott Rakes, MD;  Location: [facility] ENDOSCOPY;  Service: Endoscopy;  Laterality: N/A;   ESOPHAGOGASTRODUODENOSCOPY (EGD) WITH PROPOFOL N/A 05/27/2019   Procedure: ESOPHAGOGASTRODUODENOSCOPY (EGD) WITH PROPOFOL;  Surgeon: [name], MD;  Location: [facility] ENDOSCOPY;  Service: Endoscopy;  Laterality: N/A;   ESOPHAGOGASTRODUODENOSCOPY (EGD) WITH PROPOFOL N/A 07/16/2019   Procedure: ESOPHAGOGASTRODUODENOSCOPY (EGD) WITH PROPOFOL;  Surgeon: Vida Rigger, MD;  Location: [facility] ENDOSCOPY;  Service: Endoscopy;  Laterality: N/A;   ESOPHAGOGASTRODUODENOSCOPY (EGD) WITH PROPOFOL N/A 09/09/2019   Procedure: ESOPHAGOGASTRODUODENOSCOPY (EGD) WITH PROPOFOL;  Surgeon: Vida Rigger, MD;  Location: [facility] ENDOSCOPY;  Service: Endoscopy;  Laterality: N/A;   ESOPHAGOGASTRODUODENOSCOPY (EGD) WITH PROPOFOL N/A 05/27/2020   Procedure: ESOPHAGOGASTRODUODENOSCOPY (EGD) WITH PROPOFOL;  Surgeon: Napoleon Form, MD;  Location: [facility] ENDOSCOPY;  Service: Endoscopy;  Laterality: N/A;   FINE NEEDLE ASPIRATION  09/09/2019   Procedure: FINE NEEDLE ASPIRATION (FNA) LINEAR;  Surgeon: Vida Rigger, MD;  Location: [facility] ENDOSCOPY;  Service: Endoscopy;;   FOREIGN BODY REMOVAL  07/16/2019   Procedure: FOREIGN BODY REMOVAL;  Surgeon: Vida Rigger, MD;  Location: [facility] ENDOSCOPY;  Service: Endoscopy;;   FOREIGN BODY REMOVAL  05/27/2020   Procedure: FOREIGN BODY REMOVAL;  Surgeon: Napoleon Form,  MD;  Location: [facility] ENDOSCOPY;  Service: Endoscopy;;    INTERCOSTAL NERVE BLOCK Right 09/01/2020   Procedure: INTERCOSTAL NERVE BLOCK;  Surgeon: Corliss Skains, MD;  Location: [facility] OR;  Service: Thoracic;  Laterality: Right;   INTERCOSTAL NERVE BLOCK Right 09/07/2020   Procedure: INTERCOSTAL NERVE BLOCK;  Surgeon: Corliss Skains, MD;  Location: [facility] OR;  Service: Thoracic;  Laterality: Right;   IR REPLC DUODEN/JEJUNO TUBE PERCUT W/FLUORO  09/08/2020   IR REPLC DUODEN/JEJUNO TUBE PERCUT W/FLUORO  09/12/2020   LAPAROSCOPIC JEJUNOSTOMY N/A 05/31/2020   Procedure: LAPAROSCOPIC JEJUNOSTOMY;  Surgeon: Corliss Skains, MD;  Location: [facility] OR;  Service: Thoracic;  Laterality: N/A;  EGD, C-arm required.   SHOULDER SURGERY     UPPER ESOPHAGEAL ENDOSCOPIC ULTRASOUND (EUS) N/A 09/09/2019   Procedure: UPPER ESOPHAGEAL ENDOSCOPIC ULTRASOUND (EUS);  Surgeon: [name], MD;  Location: [facility] ENDOSCOPY;  Service: Endoscopy;  Laterality: N/A;   HPI:  55 y/o male admitted 7/28 for an Ivor-Lewis esophagectomy due to benign lower esophageal stricture and protein malnutrition.   8/3 pt returned to OR for esophageal rupture with exploration and repair with Jtube replacement. He remained intubated 7/28-8/5; reintubated 8/5 until trach 8/9.  Had episodes of asymptomatic asystole on 8/17 and bradycardia. Cardiology believes these may be vagally mediated.  PMHx: anxiety, CAD, MI, Stroke, multiple balloon dilations to esophageal stricture.   Assessment / Plan / Recommendation Clinical Impression  Pt was cleared from a medical standpoint to initiate evaluation of swallowing, although with limited clinical trials only until he is able to complete an esophagram. Pt denies any h/o oropharyngeal dysphagia even after his stroke that he said did impact his speech. He does acknowledge h/o esophageal dysphagia though and seems very anxious about resuming swallowing after everything he's been through this admission. Oral motor exam appears to be functional and although his speech is not  precise, his vocal quality is relatively clear once PMV is donned.   Audible congestion is noted at baseline but with no overt coughing or changes in vocal quality across minimal POs accepted, including an ice chip, a spoonful of water, and a cup sip of water. There is hyolaryngeal movement appreciated to suggest at least an attempt at swallowing, but pt insists that he cannot swallow. He also has c/o globus sensation vs residue, saying that the ice chip in particular got "hung." A lot of encouragement, education, and cueing was provided to try to get him to take any additional boluses, but he would not take anything else out of fear of potential dysphagia.   Given such limited observation it is difficult to truly assess oropharyngeal swallowing and aspiration risk, but even without any overt signs of aspiration, I think it would be very challenging for pt to consume enough volume for completion of esophagram. MD updated on results from today so as to determine next steps. If esophgram is ordered, would ensure PMV is placed for testing. SLP will continue to follow and progress as able.  SLP Visit Diagnosis: Dysphagia, unspecified (R13.10)    Aspiration Risk  Mild aspiration risk;Moderate aspiration risk    Diet Recommendation NPO (pending completion of esophagram)   Medication Administration: Via alternative means    Other  Recommendations Oral Care Recommendations: Oral care QID Other Recommendations: Have oral suction available   Follow up Recommendations LTACH      Frequency and Duration min 2x/week  2 weeks       Prognosis Prognosis for Safe Diet Advancement: Good Barriers to Reach Goals:  Other (Comment) (anxiety)      Swallow Study   General HPI: 55 y/o male admitted 7/28 for an Ivor-Lewis esophagectomy due to benign lower esophageal stricture and protein malnutrition.   8/3 pt returned to OR for esophageal rupture with exploration and repair with Jtube replacement. He remained  intubated 7/28-8/5; reintubated 8/5 until trach 8/9.  Had episodes of asymptomatic asystole on 8/17 and bradycardia. Cardiology believes these may be vagally mediated.  PMHx: anxiety, CAD, MI, Stroke, multiple balloon dilations to esophageal stricture. Type of Study: Bedside Swallow Evaluation Previous Swallow Assessment: none in chart Diet Prior to this Study: NPO;Other (Comment) (J-tube) Temperature Spikes Noted: No Respiratory Status: Trach;Trach Collar Trach Size and Type: Cuff;#8;With PMSV in place History of Recent Intubation: Yes Length of Intubations (days): 12 days (across two intubations) Date extubated:  (trach 8/9) Behavior/Cognition: Alert;Cooperative;Other (Comment) (anxious about swallowing) Oral Cavity Assessment: Within Functional Limits Oral Care Completed by SLP: Yes Oral Cavity - Dentition: Poor condition;Missing dentition Vision: Functional for self-feeding Self-Feeding Abilities: Able to feed self Baseline Vocal Quality: Normal Volitional Cough: Congested Volitional Swallow: Unable to elicit    Oral/Motor/Sensory Function Overall Oral Motor/Sensory Function: Within functional limits   Ice Chips Ice chips: Impaired Presentation: Spoon Pharyngeal Phase Impairments: Other (comments) (pt with subjective c/o residue)   Thin Liquid Thin Liquid: Impaired Presentation: Cup;Self Fed;Spoon Pharyngeal  Phase Impairments: Other (comments) (pt with subjective c/o difficulty swallowing)    Nectar Thick Nectar Thick Liquid: Not tested   Honey Thick Honey Thick Liquid: Not tested   Puree Puree: Not tested   Solid     Solid: Not tested      Mahala Menghini., M.A. CCC-SLP Acute Rehabilitation Services Pager [phone] Office [phone]  09/28/2020,1:16 PM

## 2020-09-28 NOTE — Progress Notes (Signed)
Inpatient Rehab Admissions Coordinator:   Consult received in error.  Pt to continue see acute PT and likely plan for SNF when medically stable to discharge.  Will discontinue CIR order.   Estill Dooms, PT, DPT Admissions Coordinator [phone] 09/28/20  2:34 PM

## 2020-09-28 NOTE — NC FL2 (Addendum)
[place] LEVEL OF CARE SCREENING TOOL     IDENTIFICATION  Patient Name: Ralph [name] Birthdate: 1965/02/15 Sex: male Admission Date (Current Location): 09/01/2020  [facility] and [place] Number:  Herbalist and Address:  [facility], [place] [address] [place], [place] [address]      Provider Number: [id number]  Attending Physician Name and Address:  Lajuana Matte, MD  Relative Name and Phone Number:  Burnadette Pop [phone]    Current Level of Care: Hospital Recommended Level of Care: [place] (trach snf) Prior Approval Number:    Date Approved/Denied:   PASRR Number: [id number]  Discharge Plan: Other (Comment) (trach/snf)    Current Diagnoses: Patient Active Problem List   Diagnosis Date Noted   Heart block AV complete ([place])    Pressure injury of skin 09/20/2020   Jejunostomy tube leak (H[facility])    Respiratory failure ([place])    Endotracheally intubated    Esophagectomy, anastomotic leak    Esophageal stricture 09/01/2020   Abdominal pain 06/30/2020   Cellulitis 06/23/2020   Protein-calorie malnutrition, severe 05/25/2020   Epididymitis 04/06/2020   Right inguinal hernia 09/14/2019   Depression, recurrent ([place]) 06/11/2019   GERD with esophagitis 05/27/2019   Bradycardia 05/04/2019   Chest tube in place 05/04/2019   Does not have health insurance    Dysphagia    Hyponatremia    Tobacco use disorder    Neuropathic pain 08/29/2017   High risk social situation 08/29/2017   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   Pure hypercholesterolemia 11/26/2012   Cerebrovascular disease 11/25/2012   Peripheral vascular disease, unspecified ([place]) 11/25/2012   Coronary artery disease 11/25/2012   Tobacco abuse counseling 11/25/2012   Essential hypertension, benign 11/25/2012    Orientation RESPIRATION BLADDER Height & Weight     Self, Time, Situation, Place (intubated/trach)  Tracheostomy  (28%) [place] 14m uncuffed  Incontinent, External catheter (External Urinary catheter) Weight: 121 lb 11.1 oz (55.2 kg) Height:  5' 8"  (172.7 cm)  BEHAVIORAL SYMPTOMS/MOOD NEUROLOGICAL BOWEL NUTRITION STATUS      Incontinent Diet (Please see discharge summary, has J-tube)  AMBULATORY STATUS COMMUNICATION OF NEEDS Skin   Total Care Verbally (passey muir speaking valve) Other (Comment) (catheter abdomen,chest,R,L,lower,gauze,ecchymosis,flank,L,PI sacrum posterior,mid deep tissue,foam lift dressing,clean,dry,intact,PRN,purple;Pink,erythema non-blanchable,incision closed abdomen other PRN,Incision closed chest R,Gauze,PRN)                       Personal Care Assistance Level of Assistance  Bathing, Feeding, Dressing Bathing Assistance: Maximum assistance Feeding assistance: Maximum assistance (NPO,J-tube) Dressing Assistance: Maximum assistance     Functional Limitations Info  Sight, Hearing, Speech     Speech Info: Impaired (passey muir speaking valve)    SPECIAL CARE FACTORS FREQUENCY  PT (By licensed PT), OT (By licensed OT)     PT Frequency: 5x min weekly OT Frequency: 5x min weekly            Contractures Contractures Info: Not present    Additional Factors Info  Code Status, Allergies, Psychotropic, Insulin Sliding Scale Code Status Info: FULL Allergies Info: No Known Allergies Psychotropic Info: clonazepam (klonopin) tablet 157m2 times daily, Quetiapine (seroquel) tablet 5091m times daily Insulin Sliding Scale Info: insulin aspart (novoLOG) injection 0-24 units every 4 hours       Current Medications (09/28/2020):  This is the current hospital active medication list Current Facility-Administered Medications  Medication Dose Route Frequency Provider Last Rate Last Admin  0.9 %  sodium chloride infusion   Intravenous PRN Lajuana Matte, MD   Stopped at 09/27/20 2340   acetaminophen (TYLENOL) 160 MG/5ML solution 650 mg  650 mg Per Tube Q4H PRN Kipp Brood,  MD       albuterol (PROVENTIL) (2.5 MG/3ML) 0.083% nebulizer solution 2.5 mg  2.5 mg Nebulization Q4H PRN Candee Furbish, MD       atropine 1 MG/10ML injection 0.5 mg  0.5 mg Intravenous PRN Buford Dresser, MD   1 mg at 09/26/20 0341   chlorhexidine gluconate (MEDLINE KIT) (PERIDEX) 0.12 % solution 15 mL  15 mL Mouth Rinse BID Lajuana Matte, MD   15 mL at 09/28/20 2119   Chlorhexidine Gluconate Cloth 2 % PADS 6 each  6 each Topical Daily Jadene Pierini E, PA-C   6 each at 09/27/20 1822   enoxaparin (LOVENOX) injection 40 mg  40 mg Subcutaneous QHS Candee Furbish, MD   40 mg at 09/27/20 2146   escitalopram (LEXAPRO) tablet 10 mg  10 mg Per Tube QHS Candee Furbish, MD   10 mg at 09/27/20 2145   feeding supplement (PROSource TF) liquid 45 mL  45 mL Per Tube BID Kipp Brood, MD   45 mL at 09/28/20 0940   feeding supplement (VITAL 1.5 CAL) liquid 1,000 mL  1,000 mL Per Tube Continuous Kipp Brood, MD 70 mL/hr at 09/28/20 1000 Infusion Verify at 09/28/20 1000   Gerhardt's butt cream   Topical QID Lajuana Matte, MD   1 application at 41/74/08 0941   HYDROmorphone (DILAUDID) injection 0.5 mg  0.5 mg Intravenous Q3H PRN Kipp Brood, MD       HYDROmorphone (DILAUDID) tablet 2 mg  2 mg Oral Q4H PRN Kipp Brood, MD   2 mg at 09/28/20 1200   MEDLINE mouth rinse  15 mL Mouth Rinse 10 times per day Lajuana Matte, MD   15 mL at 09/28/20 1200   melatonin tablet 10 mg  10 mg Per Tube QHS Candee Furbish, MD   10 mg at 09/27/20 2145   meropenem (MERREM) 1 g in sodium chloride 0.9 % 100 mL IVPB  1 g Intravenous Q8H Kipp Brood, MD   Stopped at 09/28/20 0621   midodrine (PROAMATINE) tablet 10 mg  10 mg Per J Tube TID Mitzi Hansen, MD   10 mg at 09/28/20 0940   pantoprazole (PROTONIX) injection 40 mg  40 mg Intravenous Q24H Erick Colace, NP   40 mg at 09/28/20 0940   polyethylene glycol (MIRALAX / GLYCOLAX) packet 17 g  17 g Per Tube Daily PRN Kipp Brood, MD        sennosides (SENOKOT) 8.8 MG/5ML syrup 10 mL  10 mL Per Tube QHS PRN Agarwala, Einar Grad, MD       sodium chloride flush (NS) 0.9 % injection 10 mL  10 mL Intracatheter Q8H Candee Furbish, MD   10 mL at 09/28/20 0341   sodium chloride flush (NS) 0.9 % injection 10-40 mL  10-40 mL Intracatheter Q12H Lightfoot, Harrell O, MD   10 mL at 09/28/20 0940   sodium chloride flush (NS) 0.9 % injection 10-40 mL  10-40 mL Intracatheter PRN Lightfoot, Lucile Crater, MD       sorbitol 70 % solution 60 mL  60 mL Per Tube Q0600 Candee Furbish, MD   60 mL at 09/27/20 0619   tobramycin (PF) (TOBI) nebulizer solution 300 mg  300 mg Nebulization BID Agarwala,  Einar Grad, MD   300 mg at 09/28/20 0826   vancomycin (VANCOREADY) IVPB 750 mg/150 mL  750 mg Intravenous Q8H Lyndee Leo, [facility]   Stopped at 09/28/20 2023     Discharge Medications: Please see discharge summary for a list of discharge medications.  Relevant Imaging Results:  Relevant Lab Results:   Additional Information [id number]  Trula Ore, LCSWA

## 2020-09-28 NOTE — Progress Notes (Signed)
  Speech Language Pathology Treatment: [name] Speaking valve  Patient Details Name: Ralph [name] MRN: [id number] DOB: Jun 23, 1965 Today's Date: 09/28/2020 Time: 8295-6213 SLP Time Calculation (min) (ACUTE ONLY): 48 min  Assessment / Plan / Recommendation Clinical Impression  Pt seems more anxious about using PMV today, agreeing to place it but then quickly wanting it to be taken off despite no evidence of respiratory difficulties and no back pressure. Other than a single, transient episode of bradycardia, his VS remained stable as well. He produces much stronger phonation when valve is donned and especially with baseline dysarthria, it helps facilitate his intelligibility. He could be encouraged to wear PMV though, and wore it for almost 20 minutes at his longest interval. Would continue to wear intermittently when full staff supervision can be provided.    HPI HPI: 55 y/o male admitted 7/28 for an Ivor-Lewis esophagectomy due to benign lower esophageal stricture and protein malnutrition.   8/3 pt returned to OR for esophageal rupture with exploration and repair with Jtube replacement. He remained intubated 7/28-8/5; reintubated 8/5 until trach 8/9.  Had episodes of asymptomatic asystole on 8/17 and bradycardia. Cardiology believes these may be vagally mediated.  PMHx: anxiety, CAD, MI, Stroke, multiple balloon dilations to esophageal stricture.      SLP Plan  Continue with current plan of care       Recommendations  Medication Administration: Via alternative means      Patient may use Passy-Muir Speech Valve: Intermittently with supervision PMSV Supervision: Full         Oral Care Recommendations: Oral care QID Follow up Recommendations: LTACH SLP Visit Diagnosis: Aphonia (R49.1) Plan: Continue with current plan of care       GO                Mahala Menghini., M.A. CCC-SLP Acute Rehabilitation Services Pager [phone] Office [phone]  09/28/2020, 1:21 PM

## 2020-09-28 NOTE — Progress Notes (Signed)
[address]       [place] [id number]             [phone]                 21 Days Post-Op Procedure(s) (LRB): XI ROBOTIC ASSISTED THORASCOPY-DECORTICATION & REVISION OF ESOPHAGECTOMY (Right) ESOPHAGOGASTRODUODENOSCOPY (EGD) (N/A) INTERCOSTAL NERVE BLOCK (Right)   Events: Trac collar 72 hours  _______________________________________________________________ Vitals: BP 105/61   Pulse 76   Temp 98.5 F (36.9 C) (Oral)   Resp (!) 22   Ht 5\' 8"  (1.727 m)   Wt 55.2 kg   SpO2 100%   BMI 18.50 kg/m   - Neuro: awake  - Cardiovascular: Sinus  Drips:       - Pulm: Trach collar    FiO2 (%):  [28 %-35 %] 28 %  ABG    Component Value Date/Time   PHART 7.414 09/08/2020 0444   PCO2ART 32.8 09/08/2020 0444   PO2ART 98 09/08/2020 0444   HCO3 20.7 09/08/2020 0444   TCO2 22 09/08/2020 0444   ACIDBASEDEF 3.0 (H) 09/08/2020 0444   O2SAT 97.0 09/08/2020 0444    - Abd: Soft - Extremity: Cool  .Intake/Output      08/23 0701 08/24 0700 08/24 0701 08/25 0700   I.V. (mL/kg) 71 (1.3)    Other 120 90   NG/GT 1592.7 210   IV Piggyback 820.5    Total Intake(mL/kg) 2604.2 (47.2) 300 (5.4)   Urine (mL/kg/hr) 2100 (1.6)    Stool     Total Output 2100    Net +504.2 +300        Stool Occurrence  1 x      _______________________________________________________________ Labs: CBC Latest Ref Rng & Units 09/28/2020 09/27/2020 09/26/2020  WBC 4.0 - 10.5 K/uL 29.2(H) 18.1(H) 20.6(H)  Hemoglobin 13.0 - 17.0 g/dL 10.9(L) 9.9(L) 10.1(L)  Hematocrit 39.0 - 52.0 % 33.3(L) 31.1(L) 32.3(L)  Platelets 150 - 400 K/uL 778(H) 737(H) 726(H)   CMP Latest Ref Rng & Units 09/28/2020 09/27/2020 09/26/2020  Glucose 70 - 99 mg/dL 09/28/2020) 782(U) 235(T)  BUN 6 - 20 mg/dL 14 14 14   Creatinine 0.61 - 1.24 mg/dL 614(E) ) 3.15(Q)  Sodium 135 - 145 mmol/L 134(L) 132(L) 135  Potassium 3.5 - 5.1 mmol/L 4.2 4.1 4.4  Chloride 98 - 111 mmol/L 102 99 101  CO2 22 - 32 mmol/L 28  28 29   Calcium 8.9 - 10.3 mg/dL 0.08(Q) 7.61(P) 9.0  Total Protein 6.5 - 8.1 g/dL 6.2(L) 5.8(L) 6.1(L)  Total Bilirubin 0.3 - 1.2 mg/dL 0.7 0.5 0.6  Alkaline Phos 38 - 126 U/L 96 93 98  AST 15 - 41 U/L 27 26 25   ALT 0 - 44 U/L 36 35 35    CXR: -  _______________________________________________________________  Assessment and Plan: POD 26 s/p robotic Ivor Lewis esophagectomy, postop day 21status post reexploration with reinforcement of anastomosis.  Neuro: pain controlled CV: brady episodes intermittently.  Asymptomatic.  Will continue to monitor Pulm: pigtail removed.  Will keep L drain until swallow.  Continues to tolerate trach collar.  Was possibility for capping and eventual decannulation of his trach. Renal: Creatinine stable. GI:  full dose tube feeds swallow eval by speech therapy.  Once performed we will plan to do a formal esophagram.  Heme: Stable ID: [id number].  On abx.  Will follow Endo: Sliding scale insulin Dispo: 2C soon.  Will plan for SNF/LTAC    3.2(I 09/28/2020 10:21  AM

## 2020-09-28 NOTE — Progress Notes (Signed)
Patient ID: Ralph [name], male   DOB: [birth year], 55 y.o.   MRN: [id number] TCTS Evening Rounds:  Afebrile Hemodynamically stable Remains on trach collar. Urine output ok Complains of pain and dilaudid 2 mg not relieving it. Will increase to 4mg . He was on 8 mg previously.

## 2020-09-29 ENCOUNTER — Inpatient Hospital Stay (HOSPITAL_COMMUNITY): Payer: Medicaid Other

## 2020-09-29 DIAGNOSIS — I442 Atrioventricular block, complete: Secondary | ICD-10-CM | POA: Diagnosis not present

## 2020-09-29 LAB — HEPATIC FUNCTION PANEL
ALT: 31 U/L (ref 0–44)
AST: 23 U/L (ref 15–41)
Albumin: 2.2 g/dL — ABNORMAL LOW (ref 3.5–5.0)
Alkaline Phosphatase: 78 U/L (ref 38–126)
Bilirubin, Direct: 0.2 mg/dL (ref 0.0–0.2)
Indirect Bilirubin: 0.4 mg/dL (ref 0.3–0.9)
Total Bilirubin: 0.6 mg/dL (ref 0.3–1.2)
Total Protein: 5.8 g/dL — ABNORMAL LOW (ref 6.5–8.1)

## 2020-09-29 LAB — BASIC METABOLIC PANEL
Anion gap: 6 (ref 5–15)
BUN: 13 mg/dL (ref 6–20)
CO2: 26 mmol/L (ref 22–32)
Calcium: 8.7 mg/dL — ABNORMAL LOW (ref 8.9–10.3)
Chloride: 104 mmol/L (ref 98–111)
Creatinine, Ser: 0.36 mg/dL — ABNORMAL LOW (ref 0.61–1.24)
GFR, Estimated: >60 mL/min (ref >60)
Glucose, Bld: 110 mg/dL — ABNORMAL HIGH (ref 70–99)
Potassium: 3.7 mmol/L (ref 3.5–5.1)
Sodium: 136 mmol/L (ref 135–145)

## 2020-09-29 LAB — CBC
HCT: 30.3 % — ABNORMAL LOW (ref 39.0–52.0)
Hemoglobin: 9.6 g/dL — ABNORMAL LOW (ref 13.0–17.0)
MCH: 29.5 pg (ref 26.0–34.0)
MCHC: 31.7 g/dL (ref 30.0–36.0)
MCV: 93.2 fL (ref 80.0–100.0)
Platelets: 666 10*3/uL — ABNORMAL HIGH (ref 150–400)
RBC: 3.25 MIL/uL — ABNORMAL LOW (ref 4.22–5.81)
RDW: 16.5 % — ABNORMAL HIGH (ref 11.5–15.5)
WBC: 12.8 10*3/uL — ABNORMAL HIGH (ref 4.0–10.5)
nRBC: 0 % (ref 0.0–0.2)

## 2020-09-29 MED ORDER — DIATRIZOATE MEGLUMINE & SODIUM 66-10 % PO SOLN
120.0000 mL | Freq: Once | ORAL | Status: AC
  Administered 2020-09-29: 10 mL via ORAL
  Filled 2020-09-29: qty 120

## 2020-09-29 MED ORDER — ONDANSETRON HCL 4 MG PO TABS
4.0000 mg | ORAL_TABLET | Freq: Four times a day (QID) | ORAL | Status: DC | PRN
  Administered 2020-09-29 – 2020-10-01 (×3): 4 mg
  Filled 2020-09-29 (×3): qty 1

## 2020-09-29 NOTE — Progress Notes (Signed)
Physical Therapy Treatment Patient Details Name: Ralph [name] MRN: [id number] DOB: 09/20/1965 Today's Date: 09/29/2020    History of Present Illness 55 y/o male admitted 7/28 for an Ivor-Lewis esophagectomy due to benign lower esophageal stricture and protein malnutrition.   8/3 pt returned to OR for esophageal rupture with exploration and repair with Jtube placement.  8/9 Tracheostomy.  PMHx: anxiety, CAD, MI, Stroke, multiple balloon dilations to esophageal stricture.    PT Comments    Pt agreed to participate after encouragement.  Pt is showing signs of more rapid improvement.  Trach downsized today.  Emphasis on transitions, transfers and plan for gait, but pt declined, stating next time.  2nd transfer to transport chair to go to for esophogram     Follow Up Recommendations  LTACH;Supervision/Assistance - 24 hour;Other (comment) (TBD as he shows more progress.)     Equipment Recommendations  Other (comment) (TBD as he progresses)    Recommendations for Other Services       Precautions / Restrictions Precautions Precautions: Fall    Mobility  Bed Mobility Overal bed mobility: Needs Assistance       Supine to sit: Min guard     General bed mobility comments: flat bed, no assist and scooted to EOB without assist    Transfers Overall transfer level: Needs assistance Equipment used: None;Rolling walker (2 wheeled) Transfers: Sit to/from [employer] Sit to Stand: Min assist Stand pivot transfers: Min guard       General transfer comment: guard to min assist during sit to stand and transfer to recliner then to transport chair to go to esophagram  Ambulation/Gait             General Gait Details: pt deferred gait then needed to go imminently to esophagram, so transferred pt to transport chair.   Stairs             Wheelchair Mobility    Modified Rankin (Stroke Patients Only)       Balance   Sitting-balance support: No upper  extremity supported Sitting balance-Leahy Scale: Fair     Standing balance support: Single extremity supported;No upper extremity supported Standing balance-Leahy Scale: Fair Standing balance comment: weak knee stance for short period without assist                            Cognition Arousal/Alertness: Awake/alert Behavior During Therapy: Flat affect Overall Cognitive Status: No family/caregiver present to determine baseline cognitive functioning                                 General Comments: pt speaking well with PMV      Exercises Other Exercises Other Exercises: bil hip/knee flexion/ext ROM exercise AROM and graded resistance in extension x 10 reps prior to mobility.    General Comments General comments (skin integrity, edema, etc.): vss on 28% FiO2, trach downsized today.      Pertinent Vitals/Pain Pain Assessment: Faces Faces Pain Scale: No hurt Pain Location: across his chest Pain Descriptors / Indicators: Sore Pain Intervention(s): Monitored during session    Home Living                      Prior Function            PT Goals (current goals can now be found in the care plan section) Acute Rehab PT Goals  Patient Stated Goal: did not state today PT Goal Formulation: Patient unable to participate in goal setting Time For Goal Achievement: 09/30/20 Potential to Achieve Goals: Good    Frequency    Min 3X/week      PT Plan Current plan remains appropriate    Co-evaluation              AM-PAC PT "6 Clicks" Mobility   Outcome Measure  Help needed turning from your back to your side while in a flat bed without using bedrails?: A Little Help needed moving from lying on your back to sitting on the side of a flat bed without using bedrails?: A Little Help needed moving to and from a bed to a chair (including a wheelchair)?: A Little Help needed standing up from a chair using your arms (e.g., wheelchair or bedside  chair)?: A Little Help needed to walk in hospital room?: A Lot Help needed climbing 3-5 steps with a railing? : A Lot 6 Click Score: 16    End of Session Equipment Utilized During Treatment: Oxygen Activity Tolerance: Patient tolerated treatment well Patient left: Other (comment) (left in transport chair to go to Flouroscopy.) Nurse Communication: Mobility status PT Visit Diagnosis: Other abnormalities of gait and mobility (R26.89);Muscle weakness (generalized) (M62.81);Difficulty in walking, not elsewhere classified (R26.2) Pain - part of body:  (across chest)     Time: 9892-1194 PT Time Calculation (min) (ACUTE ONLY): 32 min  Charges:  $Therapeutic Activity: 23-37 mins                     09/29/2020  Jacinto Halim., PT Acute Rehabilitation Services [phone]  (pager) [phone]  (office)   Eliseo Gum Ouita Nish 09/29/2020, 4:00 PM

## 2020-09-29 NOTE — Procedures (Signed)
Tracheostomy Change Note  Patient Details:   Name: Ralph [name] DOB: 1965/10/22 MRN: [id number]    Airway Documentation:     Evaluation  O2 sats: stable throughout Complications: No apparent complications Patient did tolerate procedure well. Bilateral Breath Sounds: Rhonchi   Pts trach downsized to #6 cuffless per MD order. Pt tolerated procedure well. ETCO2 positive for color change. Suction catheter passed with no complications. Trach secured.  Guss Bunde 09/29/2020, 2:33 PM

## 2020-09-29 NOTE — Progress Notes (Signed)
  Speech Language Pathology Treatment: Dysphagia;Passy Muir Speaking valve  Patient Details Name: SLADE PIERPOINT MRN: [id number] DOB: [birth year] Today's Date: 09/29/2020 Time: 0312-8118 SLP Time Calculation (min) (ACUTE ONLY): 24 min  Assessment / Plan / Recommendation Clinical Impression  Pt is more amenable to PO trials of water today, acknowledging that he was nervous but also not feeling as well on previous date. He seems to respond well when given education and extra time. PMV was placed first, with good phonation achieved and less audible secretions. He did not need to make any attempts at clearance while donned and he also did not have any bradycardia observed. He proceeded with caution when taking sips of room temperature water with no overt signs of aspiration noted. His swallow appears to be swift, and although it has been quite a long time since he has had any POs, it has also been since 8/9 that he has had ETT. Discussed with both PCCM and TCTS - although there remains some risk and silent aspiration cannot be ruled out at bedside, he appears to be more ready to attempt esophagram at this point. Educated pt on the importance of bringing his PMV down to radiology when it is time for his testing to use while drinking barium. Will f/u after completion of esophagram for potential to initiate up to clear liquid diet. Will also try to provide additional training on donning/doffing PMV as discussed with PCCM to try to allow him to wear it more throughout the day as they try to progress him to a smaller size.   HPI HPI: 55 y/o male admitted 7/28 for an Ivor-Lewis esophagectomy due to benign lower esophageal stricture and protein malnutrition.   8/3 pt returned to OR for esophageal rupture with exploration and repair with Jtube replacement. He remained intubated 7/28-8/5; reintubated 8/5 until trach 8/9.  Had episodes of asymptomatic asystole on 8/17 and bradycardia. Cardiology believes these may be  vagally mediated.  PMHx: anxiety, CAD, MI, Stroke, multiple balloon dilations to esophageal stricture.      SLP Plan  Continue with current plan of care       Recommendations  Diet recommendations: NPO (pending completion of esophagram) Medication Administration: Via alternative means      Patient may use Passy-Muir Speech Valve: Intermittently with supervision PMSV Supervision: Full         Oral Care Recommendations: Oral care QID Follow up Recommendations: LTACH SLP Visit Diagnosis: Aphonia (R49.1);Dysphagia, unspecified (R13.10) Plan: Continue with current plan of care       GO                Mahala Menghini., M.A. CCC-SLP Acute Rehabilitation Services Pager [phone] Office [phone]  09/29/2020, 9:51 AM

## 2020-09-29 NOTE — Progress Notes (Signed)
      [address]       [place] [address]             [phone]                 22 Days Post-Op Procedure(s) (LRB): XI ROBOTIC ASSISTED THORASCOPY-DECORTICATION & REVISION OF ESOPHAGECTOMY (Right) ESOPHAGOGASTRODUODENOSCOPY (EGD) (N/A) INTERCOSTAL NERVE BLOCK (Right)   Events: No events  _______________________________________________________________ Vitals: BP 129/84   Pulse 70   Temp 98.4 F (36.9 C) (Oral)   Resp 16   Ht 5\' 8"  (1.727 m)   Wt 53.6 kg   SpO2 95%   BMI 17.97 kg/m   - Neuro: awake  - Cardiovascular: Sinus  Drips:       - Pulm: Trach collar    FiO2 (%):  [28 %] 28 %  ABG    Component Value Date/Time   PHART 7.414 09/08/2020 0444   PCO2ART 32.8 09/08/2020 0444   PO2ART 98 09/08/2020 0444   HCO3 20.7 09/08/2020 0444   TCO2 22 09/08/2020 0444   ACIDBASEDEF 3.0 (H) 09/08/2020 0444   O2SAT 97.0 09/08/2020 0444    - Abd: Soft - Extremity: Cool  .Intake/Output      08/24 0701 08/25 0700 08/25 0701 08/26 0700   I.V. (mL/kg)     Other 90    NG/GT 910    IV Piggyback 500    Total Intake(mL/kg) 1500 (28)    Urine (mL/kg/hr) 950 (0.7)    Stool 0    Total Output 950    Net +550         Stool Occurrence 2 x       _______________________________________________________________ Labs: CBC Latest Ref Rng & Units 09/29/2020 09/28/2020 09/27/2020  WBC 4.0 - 10.5 K/uL 12.8(H) 29.2(H) 18.1(H)  Hemoglobin 13.0 - 17.0 g/dL 09/29/2020) 10.9(L) 9.9(L)  Hematocrit 39.0 - 52.0 % 30.3(L) 33.3(L) 31.1(L)  Platelets 150 - 400 K/uL 666(H) 778(H) 737(H)   CMP Latest Ref Rng & Units 09/29/2020 09/28/2020 09/27/2020  Glucose 70 - 99 mg/dL 09/29/2020) 428(J) 681(L)  BUN 6 - 20 mg/dL 13 14 14   Creatinine 0.61 - 1.24 mg/dL 572(I) ) 2.03(T)  Sodium 135 - 145 mmol/L 136 134(L) 132(L)  Potassium 3.5 - 5.1 mmol/L 3.7 4.2 4.1  Chloride 98 - 111 mmol/L 104 102 99  CO2 22 - 32 mmol/L 26 28 28   Calcium 8.9 - 10.3 mg/dL 5.97(C) 1.63(A) )  Total  Protein 6.5 - 8.1 g/dL 4.5(X) 6.2(L) 5.8(L)  Total Bilirubin 0.3 - 1.2 mg/dL 0.6 0.7 0.5  Alkaline Phos 38 - 126 U/L 78 96 93  AST 15 - 41 U/L 23 27 26   ALT 0 - 44 U/L 31 36 35    CXR: -  _______________________________________________________________  Assessment and Plan: POD 27 s/p robotic Ivor Lewis esophagectomy, postop day 22 status post reexploration with reinforcement of anastomosis.  Neuro: pain controlled CV: brady episodes intermittently.  Asymptomatic.  Will continue to monitor Pulm: pigtail removed.  Will keep L drain until swallow.  Continues to tolerate trach collar.  Was possibility for capping and eventual decannulation of his trach. Renal: Creatinine stable. GI:  full dose tube feeds.  swallow eval by speech therapy.  Once performed we will plan to do a formal esophagram.  Heme: Stable ID: [email or web address].  On abx.  Will follow Endo: Sliding scale insulin Dispo: 2C today.  Will plan for SNF/LTAC    Ralph [name] [name] 09/29/2020 9:00 AM

## 2020-09-29 NOTE — Progress Notes (Addendum)
NAME:  Ralph [name], MRN:  [id number], DOB:  1965/10/10, LOS: 28 ADMISSION DATE:  09/01/2020, CONSULTATION DATE:  8/3 REFERRING MD:  Dr. Cliffton Asters, CHIEF COMPLAINT:  esophageal rupture, esophageal stricture    History of Present Illness:  Patient is a 55 yo M w/ PMH anxiety, CAD, MI, stroke presents to [facility] on 7/28 for esophagectomy w/ Dr. Cliffton Asters for esophageal stricture.  On 7/28, Dr. Cliffton Asters performed the esophagectomy without complication.  8/3 patient taken back to OR for esophageal rupture s/p esophagectomy by Dr. Cliffton Asters.   Patient is improving and has been doing well on trach collar since 8/22. Pertinent  Medical History   Past Medical History:  Diagnosis Date   Anxiety    Coronary artery disease    Myocardial infarction [facility])    Stroke (HCC)     Significant Hospital Events: Including procedures, antibiotic start and stop dates in addition to other pertinent events   7/28 admitted for planned esophagectomy. Chest tube was placed on right chest. J tube in place. 2 JP drains in place. Patient was extubated post procedure. Patient developed subcutaneous emphysema. Unable to use J tube due to leakage around site. 8/3: PCCM consulted for vent management after pt brought back to OR for esophageal rupture/ leak. Vanc/zosyn/fluconazole started. Hypotension noted post-op. Felt 2/2 Sepsis; treated w/ volume and pressors.  8/4: Still on norepinephrine.  Central venous pressure suggesting he is still hypovolemic so receiving fluid.  Spontaneous breathing trial initiated but not ready for extubation due to work of breathing.  culture sent from chest tube.  J-tube replaced in interventional radiology due to leaking.  Tube feeds resumed in the afternoon 8/5: J-tube site again leaking, tube feeds placed on hold around midnight.Still pressor dependent . Passed SBT. WOB improved.  Failed extubation and was reintubated 8/8: J-tube upsized 8/9: Tracheostomy 8/10 J-tube continues to leak. TF  stopped. RT IJ CVC removed. 8/13 starting trickle feeds 8/15 bronch 8/16 pigtail to L fluid collection 8/19 Left Chest Tube removed  8/22 Switch dilaudid to PRN  8/23 Discontinue dilaudid, clonazepam, seroquel 8/24 Initiate swallow study 8/25 Bedside swallow successful  Antibiotics: Cefoxitin 7/28-7/29 Fluconazole 8/3>>8/17 Gentamicin 8/3 Zosyn 8/3>8/10 Vancomycin 8/3-8/7  Ceftazidime 8/10>> Tobramycin nebs 8/10>> Flagyl 8/16 >>8/19 Vanc 8/19 >> 8/25 Meropenem 8/19 >> 8/25  Interim History / Subjective:  Patient has improved pain at the chest tube site. Patient swallowing small sips of water and expresses that it has improved since yesterday.  Objective   Blood pressure 129/84, pulse 70, temperature 98.4 F (36.9 C), temperature source Oral, resp. rate 16, height 5\' 8"  (1.727 m), weight 53.6 kg, SpO2 95 %.    FiO2 (%):  [28 %] 28 %   Intake/Output Summary (Last 24 hours) at 09/29/2020 0920 Last data filed at 09/29/2020 0903 Gross per 24 hour  Intake 1369.97 ml  Output 950 ml  Net 419.97 ml    Filed Weights   09/27/20 0500 09/28/20 0700 09/29/20 0500  Weight: 53.6 kg 55.2 kg 53.6 kg    Examination: Cachetic man, responsive and engaging.  Lungs with rhonci overall improved, triggering vent. Trach Collar Abdomen soft, R JP drain in place, has JP on R draining esophagectomy site Muscle wasting without edema  WBC decreased (20.6 --> 12.8)  Plts down 737 --> 667   Resolved Hospital Problem list   Constipation   Assessment & Plan:  Acute respiratory failure w/ hypoxia and hypercarbia requiring mechanical ventilation. s/p tracheostomy (8/9) HAP- Pan-sensitive Pseudomonas pneumonia L loculated effusion, pneumonia  with necrosis:  s/p Chest Tube (8/16), culture with prevotella, enterococcus; pigtail out 8/20  Plan  - Vanc/meropenem x 10-14 days, plan to stop 8/25 - Repeat chest X - ray  Rising white count: White count increased to 29.2 from 18.1. Patient afebrile  and doing well clinically. Most likely a stress reaction. White count decreased to 12.9 on 8/25.  Plan - Continue to trend Wbc   Esophageal stricture s/p esophagectomy complicated by post operative esophageal rupture and J-tube leakage: Intermittent issue throughout stay, CT a/p looks okay position.  J-tube upsized 8/8. Tube feeding stopped again on 8/11 due to leak around J tube, yellow/green drainage overnight per nursing. 8/13 restarted trickle tube feeds, but stopped due to pain. Pain did not resolve after TF cessation and TF restarted 8/14.  Minimal amount of J - tube leakage. 24/7 Trach collar trial since 8/22, patient doing well. Patient bedside swallow successful with speech pathology today.  Plan - Continue 24/7 trach collar trial - Move from cuffed to uncuffed trach - Consider leave PMV on for longer periods - Barium swallow study - If barium swallow successful, consider capping trial  - Inpatient rehabilitation    Severe protein calorie malnutrition, predates surgery    Plan - Continue aggressive enteral nutrition plan  Constipation: improved, large BM 8/20. Patient is now experiencing soft and watery stools and refuses to continue bowel regiment as a result.  Plan - d/c reglan  - continue miralax, sennosides, sorbitol, and docusate prn - Continue TF - JP management per TCTS   Bradycardia, QTC 390, question vagal events Consulted with nurse, and vagal events most likely secondary to suction and coughing events. No brady events secondary to suctioning or coughing episodes on 8/24. Patient continues to haves stable suctioning and coughing events as of 8/25.  Plan -Cardiology Consulted >> stopped dobutamine 8/17 -Atropine PRN if prolonged  - Continue to monitor episodes of bradycardia   Pressure Wound - Sacrum -Continue q2h turn -Continue foam dressing  Depression- continue lexapro; seroquel discontniued  Best Practice (right click and "Reselect all SmartList  Selections" daily)   Diet/type: TF DVT prophylaxis: LMWH GI prophylaxis: PPI Lines: NG, J tube, PICC Foley:  Condom cath Code Status:  full code Last date of multidisciplinary goals of care discussion: goal is LTACH  Taneka Espiritu Anne Shutter, MS4

## 2020-09-29 NOTE — TOC Progression Note (Addendum)
Transition of Care [facility]) - Progression Note    Patient Details  Name: Ralph [name] MRN: [id number] Date of Birth: 02-15-1965  [facility]) CM/SW Contact  Terrial Rhodes, LCSWA Phone Number: 09/29/2020, 2:18 PM  Clinical Narrative:     No current SNF bed offers for patient. CSW tried to call Clydie Braun with [facility] to check on referral status. Clydie Braun is not in today but will be back tomorrow. CSW left voicemail for patients significant other to update her on status. CSW awaiting callback.CSW will continue to follow and assist with dc planning needs.  Expected Discharge Plan:  (Vent/SNF) Barriers to Discharge: Continued Medical Work up  Expected Discharge Plan and Services Expected Discharge Plan:  (Vent/SNF) In-house Referral: Clinical Social Work     Living arrangements for the past 2 months: Single Family Home                                       Social Determinants of Health (SDOH) Interventions    Readmission Risk Interventions No flowsheet data found.

## 2020-09-30 LAB — CBC
HCT: 30 % — ABNORMAL LOW (ref 39.0–52.0)
Hemoglobin: 9.9 g/dL — ABNORMAL LOW (ref 13.0–17.0)
MCH: 30.5 pg (ref 26.0–34.0)
MCHC: 33 g/dL (ref 30.0–36.0)
MCV: 92.3 fL (ref 80.0–100.0)
Platelets: 579 10*3/uL — ABNORMAL HIGH (ref 150–400)
RBC: 3.25 MIL/uL — ABNORMAL LOW (ref 4.22–5.81)
RDW: 16.1 % — ABNORMAL HIGH (ref 11.5–15.5)
WBC: 13.9 10*3/uL — ABNORMAL HIGH (ref 4.0–10.5)
nRBC: 0 % (ref 0.0–0.2)

## 2020-09-30 LAB — BASIC METABOLIC PANEL
Anion gap: 4 — ABNORMAL LOW (ref 5–15)
BUN: 11 mg/dL (ref 6–20)
CO2: 28 mmol/L (ref 22–32)
Calcium: 8.4 mg/dL — ABNORMAL LOW (ref 8.9–10.3)
Chloride: 99 mmol/L (ref 98–111)
Creatinine, Ser: 0.31 mg/dL — ABNORMAL LOW (ref 0.61–1.24)
GFR, Estimated: >60 mL/min (ref >60)
Glucose, Bld: 104 mg/dL — ABNORMAL HIGH (ref 70–99)
Potassium: 4 mmol/L (ref 3.5–5.1)
Sodium: 131 mmol/L — ABNORMAL LOW (ref 135–145)

## 2020-09-30 MED ORDER — LIDOCAINE 5 % EX PTCH
1.0000 | MEDICATED_PATCH | CUTANEOUS | Status: DC
  Administered 2020-10-01 – 2020-10-17 (×4): 1 via TRANSDERMAL
  Filled 2020-09-30 (×13): qty 1

## 2020-09-30 NOTE — Progress Notes (Signed)
RN called for RT to come STAT to bedside d/t trach appeared dislodged.  Upon entering room, RN x 2 helping to hold trach, noted trach tip appeared at the stoma but almost completely out.  Removed trach, inserted obturator in trach and placed trach back in.  + air movement, + productive cough, another RT at bedside assisted in sx pt for mod amount thick tan secretions.  + BBSH diminished w/ rhonchi, + EZ cap color change (purple to yellow).  Sat 100% on 28% trach collar, call bell given to pt and is in his lap. RN at bedside t/o.  NO distress currently noted.

## 2020-09-30 NOTE — Progress Notes (Signed)
Inpatient Rehab Admissions Coordinator:   Per CSW request pt was screened for candidacy for CIR. Pt appears to be doing fairly well with mobility, but has been declining progressing with mobility the last 2 sessions.  I will rescreen on Monday for participation.    Estill Dooms, PT, DPT Admissions Coordinator [phone] 09/30/20  2:45 PM

## 2020-09-30 NOTE — Plan of Care (Signed)
  Problem: Nutritional: Goal: Ability to achieve adequate nutritional intake will improve Outcome: Progressing   Problem: Respiratory: Goal: Ability to maintain a clear airway will improve Outcome: Progressing   Problem: Skin Integrity: Goal: Demonstration of wound healing without infection will improve Outcome: Progressing

## 2020-09-30 NOTE — TOC Progression Note (Signed)
Transition of Care [facility]) - Progression Note    Patient Details  Name: Ralph [name] MRN: [id number] Date of Birth: September 21, 1965  Transition of Care [facility]) CM/SW Contact  Terrial Rhodes, LCSWA Phone Number: 09/30/2020, 2:53 PM  Clinical Narrative:     Patient has no current SNF bed offers. CSW reached out to [place] with CIR. Luther Parody is going to reevaluate patient on Monday for possible CIR. CSW will continue to follow and assist with dc planning needs.  Expected Discharge Plan:  (Vent/SNF) Barriers to Discharge: Continued Medical Work up  Expected Discharge Plan and Services Expected Discharge Plan:  (Vent/SNF) In-house Referral: Clinical Social Work     Living arrangements for the past 2 months: Single Family Home                                       Social Determinants of Health (SDOH) Interventions    Readmission Risk Interventions No flowsheet data found.

## 2020-09-30 NOTE — Progress Notes (Signed)
  Speech Language Pathology Treatment: Dysphagia;Passy Muir Speaking valve  Patient Details Name: Ralph [name] MRN: [id number] DOB: 08/19/1965 Today's Date: 09/30/2020 Time: 3244-0102 SLP Time Calculation (min) (ACUTE ONLY): 48 min  Assessment / Plan / Recommendation Clinical Impression  [name] was seen for PO trials and use of PMV. Ralph [name] was downsized yesterday from #8 cuffed to a Shiley #6 cuffless. He has good leak speech with intelligibility at roughly 80% without PMV. SLP placed PMV and intelligibility increased to 90%+. He utilizes very short bursts of speech due to shallow, clavicular breathing pattern. He benefited from cues to breathe from his belly and take a deep breath prior to starting a sentence. He completed PO trials of thin liquids without s/s aspiration. Esophagram yesterday revealed no leak, but notable pharyngeal residue. SLP viewed this study and it appears to be pyriform sinus residue (? Reduced UES opening). Pt would benefit from objective study of pharyngeal phase prior to initiating a solid diet. Plan to initiate a full clear liquid diet this date with pt being reminded to swallow multiple times to aid in pharyngeal clearance.   He was seen with him elixir form of tylenol taken PO with a mild throat clear after. He grimaced d/t the taste, so it is difficult to discern if this was related to difficulty swallowing or simply dislike of substance. SLP service to follow closely for diet upgrade as indicated.   HPI HPI: 55 y/o male admitted 7/28 for an Ivor-Lewis esophagectomy due to benign lower esophageal stricture and protein malnutrition.   8/3 pt returned to OR for esophageal rupture with exploration and repair with Jtube replacement. He remained intubated 7/28-8/5; reintubated 8/5 until trach 8/9.  Had episodes of asymptomatic asystole on 8/17 and bradycardia. Cardiology believes these may be vagally mediated.  PMHx: anxiety, CAD, MI, Stroke, multiple balloon dilations to  esophageal stricture.      SLP Plan  Continue with current plan of care       Recommendations  Diet recommendations: Thin liquid Liquids provided via: Cup;Straw;Teaspoon Medication Administration: Via alternative means Supervision: Staff to assist with self feeding;Full supervision/cueing for compensatory strategies Compensations: Small sips/bites      Patient may use Passy-Muir Speech Valve: During PO intake/meals;Intermittently with supervision PMSV Supervision: Full         Oral Care Recommendations: Oral care BID;Oral care before and after PO SLP Visit Diagnosis: Dysphagia, unspecified (R13.10) Plan: Continue with current plan of care                     Ralph [name] P. Ralph [name], M.S., CCC-SLP Speech-Language Pathologist Acute Rehabilitation Services Pager: [phone]   Ralph [name] Ralph [name] 09/30/2020, 10:34 AM

## 2020-09-30 NOTE — Progress Notes (Signed)
Patient ID: Ralph [name], male   DOB: 02/09/1965, 55 y.o.   MRN: [id number] TCTS Evening Rounds:  Hemodynamically stable in sinus rhythm.  Remains on TC.  Urine output good.  Tolerating tube feeds.

## 2020-09-30 NOTE — Progress Notes (Addendum)
      [address]       [place] [address]             [phone]      23 Days Post-Op Procedure(s) (LRB): XI ROBOTIC ASSISTED THORASCOPY-DECORTICATION & REVISION OF ESOPHAGECTOMY (Right) ESOPHAGOGASTRODUODENOSCOPY (EGD) (N/A) INTERCOSTAL NERVE BLOCK (Right) Subjective: Did not have any difficulty with swallow study yesterday, he did not feel like the liquid was getting stuck at all. He is having some minor pain in his bilateral ribs.   Objective: Vital signs in last 24 hours: Temp:  [97.6 F (36.4 C)-98.3 F (36.8 C)] 97.6 F (36.4 C) (08/26 0745) Pulse Rate:  [48-78] 57 (08/26 0745) Cardiac Rhythm: Normal sinus rhythm;Sinus bradycardia (08/26 0000) Resp:  [13-29] 19 (08/26 0745) BP: (82-127)/(42-93) 101/60 (08/26 0745) SpO2:  [92 %-100 %] 100 % (08/26 0745) FiO2 (%):  [28 %] 28 % (08/26 0745)     Intake/Output from previous day: 08/25 0701 - 08/26 0700 In: 2229.9 [I.V.:10; NG/GT:1470; IV Piggyback:749.9] Out: 1950 [Urine:1250; Stool:700] Intake/Output this shift: No intake/output data recorded.  General appearance: alert and cooperative Heart: regular rate and rhythm, S1, S2 normal, no murmur, click, rub or gallop Lungs: coarse breath sounds, trach collar Abdomen: soft, non tender Extremities: extremities normal, atraumatic, no cyanosis or edema, no edema, redness or tenderness   Lab Results: Recent Labs    09/28/20 0530 09/29/20 0351  WBC 29.2* 12.8*  HGB 10.9* 9.6*  HCT 33.3* 30.3*  PLT 778* 666*   BMET:  Recent Labs    09/28/20 0530 09/29/20 0351  NA 134* 136  K 4.2 3.7  CL 102 104  CO2 28 26  GLUCOSE 125* 110*  BUN 14 13  CREATININE 0.35* 0.36*  CALCIUM 8.8* 8.7*    PT/INR: No results for input(s): LABPROT, INR in the last 72 hours. ABG    Component Value Date/Time   PHART 7.414 09/08/2020 0444   HCO3 20.7 09/08/2020 0444   TCO2 22 09/08/2020 0444   ACIDBASEDEF 3.0 (H) 09/08/2020 0444   O2SAT 97.0 09/08/2020 0444    CBG (last 3)  Recent Labs    09/27/20 2025 09/28/20 0026 09/28/20 0408  GLUCAP 91 100* 116*    Assessment/Plan: S/P Procedure(s) (LRB): XI ROBOTIC ASSISTED THORASCOPY-DECORTICATION & REVISION OF ESOPHAGECTOMY (Right) ESOPHAGOGASTRODUODENOSCOPY (EGD) (N/A) INTERCOSTAL NERVE BLOCK (Right)  Neuro: pain controlled, answers questions appropriately.  CV: brady episodes intermittently (40s)  Asymptomatic.  Will continue to monitor Pulm: pigtail removed.   SLP yesterday recommended NPO. Continues to tolerate trach collar.  Was possibility for capping and eventual decannulation of his trach. Renal: Creatinine stable. 0.36 yesterday.  GI:  full dose tube feeds.  swallow eval by speech therapy-NPO recommendations and formal esophagram needed.    Heme: Stable 9.6/30.3 ID: WBC down to 12.8, much improved.  Endo: Sliding scale insulin with good glucose control   Dispo: 2C today.  Will plan for SNF/LTAC. No current bed offers. Probably will go forward with the esophagram today.    LOS: 29 days    Ralph [name] 09/30/2020   Agree with above Awaiting floor bed Swallow negative for leak.  Will need extensive swallow teaching prior to starting on diet.  Will plan for diet on Monday.  Continue tube feeds Pulm on board for trach management Dispo planning  Ralph [name]

## 2020-10-01 LAB — GLUCOSE, CAPILLARY
Glucose-Capillary: 113 mg/dL — ABNORMAL HIGH (ref 70–99)
Glucose-Capillary: 101 mg/dL — ABNORMAL HIGH (ref 70–99)
Glucose-Capillary: 107 mg/dL — ABNORMAL HIGH (ref 70–99)

## 2020-10-01 MED ORDER — ORAL CARE MOUTH RINSE
15.0000 mL | Freq: Two times a day (BID) | OROMUCOSAL | Status: DC
  Administered 2020-10-01 – 2020-10-17 (×16): 15 mL via OROMUCOSAL

## 2020-10-01 MED ORDER — LOPERAMIDE HCL 1 MG/7.5ML PO SUSP
2.0000 mg | ORAL | Status: DC | PRN
  Administered 2020-10-01 – 2020-10-13 (×13): 2 mg
  Filled 2020-10-01 (×7): qty 15
  Filled 2020-10-01: qty 45
  Filled 2020-10-01 (×10): qty 15

## 2020-10-01 NOTE — Progress Notes (Signed)
24 Days Post-Op Procedure(s) (LRB): XI ROBOTIC ASSISTED THORASCOPY-DECORTICATION & REVISION OF ESOPHAGECTOMY (Right) ESOPHAGOGASTRODUODENOSCOPY (EGD) (N/A) INTERCOSTAL NERVE BLOCK (Right) Subjective: Only complaint is of frequent loose stools.  Objective: Vital signs in last 24 hours: Temp:  [97.9 F (36.6 C)-98.3 F (36.8 C)] 98 F (36.7 C) (08/27 0800) Pulse Rate:  [44-75] 58 (08/27 1000) Cardiac Rhythm: Sinus bradycardia (08/27 1000) Resp:  [11-27] 14 (08/27 1000) BP: (91-132)/(47-85) 110/66 (08/27 1000) SpO2:  [93 %-100 %] 95 % (08/27 1000) FiO2 (%):  [28 %] 28 % (08/27 1000) Weight:  [53.6 kg] 53.6 kg (08/27 0500)  Hemodynamic parameters for last 24 hours:    Intake/Output from previous day: 08/26 0701 - 08/27 0700 In: 1230 [I.V.:140; NG/GT:770] Out: 1850 [Urine:1500; Stool:350] Intake/Output this shift: Total I/O In: 44.1 [I.V.:44.1] Out: 600 [Stool:600]  General appearance: alert and cooperative Neurologic: intact Heart: regular rate and rhythm Lungs: clear to auscultation bilaterally Abdomen: soft, non-tender; bowel sounds normal; no masses,  no organomegaly Extremities: no edema Wound: incisions healed  Lab Results: Recent Labs    09/29/20 0351 09/30/20 0940  WBC 12.8* 13.9*  HGB 9.6* 9.9*  HCT 30.3* 30.0*  PLT 666* 579*   BMET:  Recent Labs    09/29/20 0351 09/30/20 0940  NA 136 131*  K 3.7 4.0  CL 104 99  CO2 26 28  GLUCOSE 110* 104*  BUN 13 11  CREATININE 0.36* 0.31*  CALCIUM 8.7* 8.4*    PT/INR: No results for input(s): LABPROT, INR in the last 72 hours. ABG    Component Value Date/Time   PHART 7.414 09/08/2020 0444   HCO3 20.7 09/08/2020 0444   TCO2 22 09/08/2020 0444   ACIDBASEDEF 3.0 (H) 09/08/2020 0444   O2SAT 97.0 09/08/2020 0444   CBG (last 3)  Recent Labs    10/01/20 0604  GLUCAP 113*    Assessment/Plan: S/P Procedure(s) (LRB): XI ROBOTIC ASSISTED THORASCOPY-DECORTICATION & REVISION OF ESOPHAGECTOMY  (Right) ESOPHAGOGASTRODUODENOSCOPY (EGD) (N/A) INTERCOSTAL NERVE BLOCK (Right)  Hemodynamically stable in sinus rhythm.  Respiratory status stable on trach collar.  Tolerating tube feeds well at goal but have frequent loose stools. He has been receiving sorbitol daily so will stop that. Use Imodium prn.   Continue mobilization. Has been seen by CIR but his candidacy depends on his motivation to participate in mobilization.   LOS: 30 days    Alleen Borne 10/01/2020

## 2020-10-02 LAB — GLUCOSE, CAPILLARY: Glucose-Capillary: 96 mg/dL (ref 70–99)

## 2020-10-02 NOTE — Progress Notes (Addendum)
    [address]       [place] [address]             [phone]      25 Days Post-Op Procedure(s) (LRB): XI ROBOTIC ASSISTED THORASCOPY-DECORTICATION & REVISION OF ESOPHAGECTOMY (Right) ESOPHAGOGASTRODUODENOSCOPY (EGD) (N/A) INTERCOSTAL NERVE BLOCK (Right) Subjective: Alert, difficult to communicate with but answers simple questions appropriately  Objective: Vital signs in last 24 hours: Temp:  [97.7 F (36.5 C)-98.5 F (36.9 C)] 97.8 F (36.6 C) (08/28 0331) Pulse Rate:  [49-73] 68 (08/28 0735) Cardiac Rhythm: Sinus bradycardia (08/28 0800) Resp:  [14-24] 23 (08/28 0735) BP: (92-115)/(54-77) 104/72 (08/28 0331) SpO2:  [94 %-98 %] 94 % (08/28 0735) FiO2 (%):  [28 %] 28 % (08/28 0735) Weight:  [58.3 kg] 58.3 kg (08/28 0331)  Hemodynamic parameters for last 24 hours:    Intake/Output from previous day: 08/27 0701 - 08/28 0700 In: 930.4 [P.O.:120; I.V.:104.6; NG/GT:705.8] Out: 600 [Stool:600] Intake/Output this shift: No intake/output data recorded.  General appearance: alert, cachectic, distracted, and no distress Heart: regular rate and rhythm Lungs: dim in bases Abdomen: non tender Extremities: no edema Wound: incis healing well  Lab Results: Recent Labs    09/30/20 0940  WBC 13.9*  HGB 9.9*  HCT 30.0*  PLT 579*   BMET:  Recent Labs    09/30/20 0940  NA 131*  K 4.0  CL 99  CO2 28  GLUCOSE 104*  BUN 11  CREATININE 0.31*  CALCIUM 8.4*    PT/INR: No results for input(s): LABPROT, INR in the last 72 hours. ABG    Component Value Date/Time   PHART 7.414 09/08/2020 0444   HCO3 20.7 09/08/2020 0444   TCO2 22 09/08/2020 0444   ACIDBASEDEF 3.0 (H) 09/08/2020 0444   O2SAT 97.0 09/08/2020 0444   CBG (last 3)  Recent Labs    10/01/20 1130 10/01/20 1552 10/02/20 0558  GLUCAP 101* 107* 96    Meds Scheduled Meds:  chlorhexidine gluconate (MEDLINE KIT)  15 mL Mouth Rinse BID   Chlorhexidine Gluconate Cloth  6 each Topical  Daily   enoxaparin (LOVENOX) injection  40 mg Subcutaneous QHS   escitalopram  10 mg Per Tube QHS   feeding supplement (PROSource TF)  45 mL Per Tube BID   Gerhardt's butt cream   Topical QID   lidocaine  1 patch Transdermal Q24H   mouth rinse  15 mL Mouth Rinse q12n4p   melatonin  10 mg Per Tube QHS   midodrine  10 mg Per J Tube TID   pantoprazole (PROTONIX) IV  40 mg Intravenous Q24H   sodium chloride flush  10 mL Intracatheter Q8H   sodium chloride flush  10-40 mL Intracatheter Q12H   Continuous Infusions:  sodium chloride 10 mL/hr at 10/01/20 1633   feeding supplement (VITAL 1.5 CAL) 70 mL/hr at 10/01/20 0500   PRN Meds:.sodium chloride, acetaminophen (TYLENOL) oral liquid 160 mg/5 mL, albuterol, atropine, HYDROmorphone (DILAUDID) injection, HYDROmorphone, loperamide HCl, ondansetron, polyethylene glycol, sodium chloride flush    Xrays No results found.  Assessment/Plan: S/P Procedure(s) (LRB): XI ROBOTIC ASSISTED THORASCOPY-DECORTICATION & REVISION OF ESOPHAGECTOMY (Right) ESOPHAGOGASTRODUODENOSCOPY (EGD) (N/A) INTERCOSTAL NERVE BLOCK (Right)  1 afebrile, VSS s BP 90's-110's , sinus rhythm, sinus brady 2 sats good on TC at 28% Fi02 3 unmeasured UOP, + frequent stooling , sorbitol stopped yesterday, TF's contributing 4 no new labs 5 Blood glucose well controlled 6 conts TF's, not taking much po 7 poss CIR if can   participate    LOS: 31 days    John Giovanni PA-C Pager [phone] 10/02/2020    Chart reviewed, patient examined, agree with above. He is making continued progress.

## 2020-10-03 DIAGNOSIS — J9601 Acute respiratory failure with hypoxia: Secondary | ICD-10-CM | POA: Diagnosis not present

## 2020-10-03 LAB — BASIC METABOLIC PANEL
Anion gap: 7 (ref 5–15)
BUN: 12 mg/dL (ref 6–20)
CO2: 27 mmol/L (ref 22–32)
Calcium: 9 mg/dL (ref 8.9–10.3)
Chloride: 98 mmol/L (ref 98–111)
Creatinine, Ser: 0.35 mg/dL — ABNORMAL LOW (ref 0.61–1.24)
GFR, Estimated: >60 mL/min (ref >60)
Glucose, Bld: 112 mg/dL — ABNORMAL HIGH (ref 70–99)
Potassium: 4.1 mmol/L (ref 3.5–5.1)
Sodium: 132 mmol/L — ABNORMAL LOW (ref 135–145)

## 2020-10-03 LAB — CBC
HCT: 32.4 % — ABNORMAL LOW (ref 39.0–52.0)
Hemoglobin: 10.4 g/dL — ABNORMAL LOW (ref 13.0–17.0)
MCH: 29.6 pg (ref 26.0–34.0)
MCHC: 32.1 g/dL (ref 30.0–36.0)
MCV: 92.3 fL (ref 80.0–100.0)
Platelets: 502 10*3/uL — ABNORMAL HIGH (ref 150–400)
RBC: 3.51 MIL/uL — ABNORMAL LOW (ref 4.22–5.81)
RDW: 15.8 % — ABNORMAL HIGH (ref 11.5–15.5)
WBC: 24.4 10*3/uL — ABNORMAL HIGH (ref 4.0–10.5)
nRBC: 0 % (ref 0.0–0.2)

## 2020-10-03 LAB — GLUCOSE, CAPILLARY: Glucose-Capillary: 107 mg/dL — ABNORMAL HIGH (ref 70–99)

## 2020-10-03 NOTE — Progress Notes (Signed)
PT Cancellation Note  Patient Details Name: Ralph [name] MRN: [id number] DOB: 07-17-1965   Cancelled Treatment:    Reason Eval/Treat Not Completed: Fatigue/lethargy limiting ability to participate Pt adamantly refusing PT this session stating he is very tired as he had an upset stomach all day yesterday and last night. PT provided max encouragement to participate, however, continued to refuse stating "nothing you say will make me work with PT today." Did report he would work with PT tomorrow between 8-9 am. Will follow up as schedule allows.   Cindee Salt, DPT  Acute Rehabilitation Services  Pager: [phone] Office: [phone]    Ralph [name] 10/03/2020, 3:23 PM

## 2020-10-03 NOTE — Progress Notes (Addendum)
      [address]       [place] [address]             [phone]      26 Days Post-Op Procedure(s) (LRB): XI ROBOTIC ASSISTED THORASCOPY-DECORTICATION & REVISION OF ESOPHAGECTOMY (Right) ESOPHAGOGASTRODUODENOSCOPY (EGD) (N/A) INTERCOSTAL NERVE BLOCK (Right) Subjective: Pain is well controlled, he did some walking around the room over the weekend.   Objective: Vital signs in last 24 hours: Temp:  [97.7 F (36.5 C)-98.1 F (36.7 C)] 97.7 F (36.5 C) (08/29 0327) Pulse Rate:  [52-75] 75 (08/29 0600) Cardiac Rhythm: Normal sinus rhythm (08/29 0700) Resp:  [11-25] 18 (08/29 0600) BP: (109-118)/(69-75) 118/75 (08/29 0327) SpO2:  [91 %-99 %] 97 % (08/29 0600) FiO2 (%):  [28 %-35 %] 35 % (08/29 0341) Weight:  [57.2 kg] 57.2 kg (08/29 0327)     Intake/Output from previous day: 08/28 0701 - 08/29 0700 In: 1064.9 [I.V.:14.9; NG/GT:1050] Out: 1075 [Urine:1075] Intake/Output this shift: No intake/output data recorded.  General appearance: alert, cooperative, and no distress Heart: regular rate and rhythm, S1, S2 normal, no murmur, click, rub or gallop Lungs: clear to auscultation bilaterally Abdomen: soft, non-tender; bowel sounds normal; no masses,  no organomegaly Extremities: extremities normal, atraumatic, no cyanosis or edema Wound: clean and dry  Lab Results: Recent Labs    09/30/20 0940 10/03/20 0540  WBC 13.9* 24.4*  HGB 9.9* 10.4*  HCT 30.0* 32.4*  PLT 579* 502*   BMET:  Recent Labs    09/30/20 0940 10/03/20 0540  NA 131* 132*  K 4.0 4.1  CL 99 98  CO2 28 27  GLUCOSE 104* 112*  BUN 11 12  CREATININE 0.31* 0.35*  CALCIUM 8.4* 9.0    PT/INR: No results for input(s): LABPROT, INR in the last 72 hours. ABG    Component Value Date/Time   PHART 7.414 09/08/2020 0444   HCO3 20.7 09/08/2020 0444   TCO2 22 09/08/2020 0444   ACIDBASEDEF 3.0 (H) 09/08/2020 0444   O2SAT 97.0 09/08/2020 0444   CBG (last 3)  Recent Labs     10/01/20 1130 10/01/20 1552 10/02/20 0558  GLUCAP 101* 107* 96    Assessment/Plan: S/P Procedure(s) (LRB): XI ROBOTIC ASSISTED THORASCOPY-DECORTICATION & REVISION OF ESOPHAGECTOMY (Right) ESOPHAGOGASTRODUODENOSCOPY (EGD) (N/A) INTERCOSTAL NERVE BLOCK (Right)    1 afebrile, NSR in the 70s, BP well controlled, continue midodrine for BP support 2 sats good on TC at 28% Fi02, working with RT 3 good urine output, + frequent stooling , sorbitol stopped yesterday, TF's contributing 4 hyponatremia, sodium 132, creatinine stable at 0.35, other electrolytes okay.  5 Blood glucose well controlled 6 H and H stable 10.4/32.4 7 poss CIR if can participate, he did do some walking around the room over the weekend per the patient.    LOS: 32 days    Sharlene Dory 10/03/2020  Agree with above Tolerating diet Continue protonix Continue speech therapy Hopefully can decanulate soon Dispo planning  Lilibeth Opie O Rekha Hobbins

## 2020-10-03 NOTE — Progress Notes (Signed)
RT note: RT suctioned copious thick  secretions. Patient has good strong cough but was unable to clear all secretions on his own. Pre and post hyper oxygenated patient. Vital signs stable at this time, Bedside RN aware.

## 2020-10-03 NOTE — Progress Notes (Signed)
Inpatient Rehab Admissions Coordinator:   CIR consult received. Note that pt. Has been declining to attempt gait during recent therapy sessions, so I am not confident that he will be able to participate in 3 hours of therapy every day. I will follow and monitor for progress and participation with therapies and pursue admission if Pt. Appears to be an appropriate candidate.   Megan Salon, MS, CCC-SLP Rehab Admissions Coordinator  [phone] (celll) [phone] (office)

## 2020-10-03 NOTE — Plan of Care (Signed)
  Problem: Education: Goal: Knowledge of the prescribed therapeutic regimen will improve Outcome: Progressing   Problem: Bowel/Gastric: Goal: Gastrointestinal status for postoperative course will improve Outcome: Progressing   Problem: Nutritional: Goal: Ability to achieve adequate nutritional intake will improve Outcome: Progressing   Problem: Respiratory: Goal: Ability to maintain a clear airway will improve Outcome: Progressing   

## 2020-10-03 NOTE — Progress Notes (Signed)
NAME:  Ralph [name], MRN:  [id number], DOB:  December 24, 1965, LOS: 32 ADMISSION DATE:  09/01/2020, CONSULTATION DATE:  8/3 REFERRING MD:  Dr. Cliffton Asters, CHIEF COMPLAINT:  esophageal rupture, esophageal stricture    History of Present Illness:  Patient is a 55 yo M w/ PMH anxiety, CAD, MI, stroke presents to [facility] on 7/28 for esophagectomy w/ Dr. Cliffton Asters for esophageal stricture.  On 7/28, Dr. Cliffton Asters performed the esophagectomy without complication.  8/3 patient taken back to OR for esophageal rupture s/p esophagectomy by Dr. Cliffton Asters. Unable to extubate post procedure. Patient remained intubated on mechanical ventilation until tracheostomy was preformed 8/9.    See timeline below for full hospital events   Pertinent  Medical History  Anxiety  CAD MI Stroke  Significant Hospital Events:  7/28 admitted for planned esophagectomy. Chest tube was placed on right chest. J tube in place. 2 JP drains in place. Patient was extubated post procedure. Patient developed subcutaneous emphysema. Unable to use J tube due to leakage around site. 8/3: PCCM consulted for vent management after pt brought back to OR for esophageal rupture/ leak. Vanc/zosyn/fluconazole started. Hypotension noted post-op. Felt 2/2 Sepsis; treated w/ volume and pressors.  8/4: Still on norepinephrine.  Central venous pressure suggesting he is still hypovolemic so receiving fluid.  Spontaneous breathing trial initiated but not ready for extubation due to work of breathing.  culture sent from chest tube.  J-tube replaced in interventional radiology due to leaking.  Tube feeds resumed in the afternoon 8/5: J-tube site again leaking, tube feeds placed on hold around midnight.Still pressor dependent . Passed SBT. WOB improved.  Failed extubation and was reintubated 8/8: J-tube upsized 8/9: Tracheostomy 8/10 J-tube continues to leak. TF stopped. RT IJ CVC removed. 8/13 starting trickle feeds 8/15 bronch 8/16 pigtail to L fluid  collection 8/25 DG esophagus: Initial swallows with water-soluble contrast demonstrated no evidence of extravasation to suggest leak 8/27 transferred to PCU 8/29 able to phonate well w/ PMV.   Antibiotics: Cefoxitin 7/28-7/29 Fluconazole 8/3>>8/17 Gentamicin 8/3 Zosyn 8/3>8/10 Vancomycin 8/3-8/7 Ceftazidime 8/10>> Tobramycin nebs 8/10>> Flagyl 8/16 >>8/19 Vanc 8/19 >> 8/21 Meropenem 8/19 >> 8/25  Interim History / Subjective:  No distress. Phonation actually pretty good w/ PMV  Objective   Blood pressure 118/74, pulse 62, temperature 97.8 F (36.6 C), temperature source Oral, resp. rate 14, height 5\' 8"  (1.727 m), weight 57.2 kg, SpO2 97 %.    FiO2 (%):  [28 %-35 %] 35 %   Intake/Output Summary (Last 24 hours) at 10/03/2020 1256 Last data filed at 10/03/2020 0600 Gross per 24 hour  Intake 1064.91 ml  Output 850 ml  Net 214.91 ml    Filed Weights   10/01/20 0500 10/02/20 0331 10/03/20 0327  Weight: 53.6 kg 58.3 kg 57.2 kg    Examination: General: Chronically ill appearing 64 male, in NAD HEENT: MM pink/moist, PERRL, size 6 cuffless trach some secretions w/ cough good phonation w/ PMV and strong cough  Neuro: awake and oriented  CV: s1s2 regular rate and rhythm, no murmur, rubs, or gallops,  PULM:  some scattered rhonchi GI: soft, bowel sounds active in all 4 quadrants, non-tender, non-distended, tolerating TFw/ J tube Extremities: warm/dry, no edema  Skin: no rashes or lesions   Resolved Hospital Problem list   Esophageal stricture  Constipation  Bradycardia, QTC 390, question vagal events -Cardiology Consulted >> stopped dobutamine 8/17 HAP (treated) _>grew pseudomonas Acute respiratory failure w/ hypoxia and hypercarbia requiring mechanical ventilation L loculated effusion, pneumonia with  necrosis Grew prevotella, enterococcus, Pigtail removed 8/20  Assessment & Plan:   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 Now working slowly towards rehabilitation.  Plan Cont routine trach care PMV as much as tolerated under staff supervision Think eventually can work towards removal but needs to be much stronger.   Severe protein calorie malnutrition, predates surgery   Plan Cont J tube feeds Adv diet as OK by surgical team     Sacral Pressure Wound  Plan  Mobilize Optimize nutrition    Depression-  Plan Lexapro   Best Practice   Per primary    Whitney D. Tiburcio Pea, NP-C [facility] Personal contact information can be found on Amion  10/03/2020, 1:17 PM

## 2020-10-04 LAB — GLUCOSE, CAPILLARY
Glucose-Capillary: 100 mg/dL — ABNORMAL HIGH (ref 70–99)
Glucose-Capillary: 103 mg/dL — ABNORMAL HIGH (ref 70–99)
Glucose-Capillary: 105 mg/dL — ABNORMAL HIGH (ref 70–99)
Glucose-Capillary: 107 mg/dL — ABNORMAL HIGH (ref 70–99)
Glucose-Capillary: 107 mg/dL — ABNORMAL HIGH (ref 70–99)
Glucose-Capillary: 115 mg/dL — ABNORMAL HIGH (ref 70–99)
Glucose-Capillary: 99 mg/dL (ref 70–99)

## 2020-10-04 LAB — PH, BODY FLUID: pH, Body Fluid: 6.4

## 2020-10-04 MED ORDER — HYDROCODONE-ACETAMINOPHEN 7.5-325 MG/15ML PO SOLN: 10.0000 mL | Freq: Four times a day (QID) | ORAL | Status: DC | PRN

## 2020-10-04 MED ORDER — JEVITY 1.5 CAL/FIBER PO LIQD
1000.0000 mL | ORAL | Status: DC
  Administered 2020-10-04 – 2020-10-10 (×6): 1000 mL
  Filled 2020-10-04 (×13): qty 1000

## 2020-10-04 MED ORDER — HYDROCODONE-ACETAMINOPHEN 7.5-325 MG/15ML PO SOLN
15.0000 mL | ORAL | Status: DC | PRN
Start: 2020-10-04 — End: 2020-10-09
  Administered 2020-10-04 – 2020-10-09 (×28): 15 mL via ORAL
  Filled 2020-10-04 (×29): qty 15

## 2020-10-04 MED ORDER — HYDROCODONE-ACETAMINOPHEN 7.5-325 MG/15ML PO SOLN
10.0000 mL | ORAL | Status: DC | PRN
  Administered 2020-10-04 (×2): 10 mL via ORAL
  Filled 2020-10-04 (×2): qty 15

## 2020-10-04 NOTE — Progress Notes (Signed)
Patient c/o pain 10/10. Dr. Laneta Simmers paged. Received returned call. Discussed patient's patient and current regime. No new pharmacological orders received.

## 2020-10-04 NOTE — Progress Notes (Signed)
PCCM Interval Note  Discussed case with RN and SLP this am.   He has been using his PMV as needed to communicate, no real secretion burden although he is tachypneic, has shallow breaths.   Would favor progressing him to PMV ad lib during the day. Hopefull can begin to consider capping trials if tolerated. I remain concerned that he still isn't strong enough.   Will continue to follow with you.   Levy Pupa, MD, PhD 10/04/2020, 10:00 AM [facility] [phone] or if no answer before 7:00PM call [phone] For any issues after 7:00PM please call eLink [phone]

## 2020-10-04 NOTE — Progress Notes (Signed)
Inpatient Rehab Admissions Coordinator:   I spoke with Pt. Regarding potential CIR admit. He is interested. I stressed that he must work with acute therapy when they come so that he can demonstrate better ability and tolerance for therapy. At this time, Pt. Continues to decline to work with PT. I will follow for potential admit to CIR, but pt. Will need to demonstrate increased participation for that to happen.   Megan Salon, MS, CCC-SLP Rehab Admissions Coordinator  [phone] (celll) [phone] (office)

## 2020-10-04 NOTE — TOC Progression Note (Signed)
Transition of Care [facility]) - Progression Note    Patient Details  Name: Ralph [name] MRN: [id number] Date of Birth: 1965/12/29  [facility]) CM/SW Contact  Leone Haven, RN Phone Number: 10/04/2020, 2:38 PM  Clinical Narrative:    Transfer to 2C, has new trach, on tube feeds, MD considering capping trach.  Patient has not been participating with therapies,  CIR liaison informed patient he must participate in order to go consider CIR as a disposition.  TOC will continue to follow for dc needs.   Expected Discharge Plan:  (Vent/SNF) Barriers to Discharge: Continued Medical Work up  Expected Discharge Plan and Services Expected Discharge Plan:  (Vent/SNF) In-house Referral: Clinical Social Work     Living arrangements for the past 2 months: Single Family Home                                       Social Determinants of Health (SDOH) Interventions    Readmission Risk Interventions No flowsheet data found.

## 2020-10-04 NOTE — Progress Notes (Signed)
PT Cancellation Note  Patient Details Name: Ralph [name] MRN: [id number] DOB: 1965-04-28   Cancelled Treatment:    Reason Eval/Treat Not Completed: Medical issues which prohibited therapy, pt not agreeable to working with therapy at this time due to diarrhea. Much encouragement given to no avail. Will check back later as time allows.   Lyanne Co, PT  Acute Rehab Services  Pager [phone] Office [phone]    Lawana Chambers Vonnie Spagnolo 10/04/2020, 9:53 AM

## 2020-10-04 NOTE — Progress Notes (Signed)
  Speech Language Pathology Treatment: [name] Speaking valve  Patient Details Name: Ralph [name] MRN: [id number] DOB: 1965-06-01 Today's Date: 10/04/2020 Time: 0950-1005 SLP Time Calculation (min) (ACUTE ONLY): 15 min  Assessment / Plan / Recommendation Clinical Impression  SLP followed up for PMSV intervention and attempted swallowing intervention (pt not agreeable for PO this date). Pt continues to tolerate trach collar and was able to exhibit good phonation without use of PMSV. PMSV placed without pt difficulty, intelligibility of speech remained intact. Vitals remained stable. RN provided tracheal suction prior to PMSV use (mild frothy secretions noted). With volitional cough, pt not able to expel any secretions via trach site or orally. Will consider future respiratory muscle training as medically appropriate pending no contraindications. Reinforced donning/doffing of PMSV with pt. PMSV left in place, recommend use during wake hours as tolerated to assist weaning and secretion management. Precautions posted at bedside. Will continue to follow.    HPI HPI: 55 y/o male admitted 7/28 for an Ivor-Lewis esophagectomy due to benign lower esophageal stricture and protein malnutrition.   8/3 pt returned to OR for esophageal rupture with exploration and repair with Jtube replacement. He remained intubated 7/28-8/5; reintubated 8/5 until trach 8/9.  Had episodes of asymptomatic asystole on 8/17 and bradycardia. Cardiology believes these may be vagally mediated.  PMHx: anxiety, CAD, MI, Stroke, multiple balloon dilations to esophageal stricture.      SLP Plan  Continue with current plan of care       Recommendations  Diet recommendations:  (clear liquids) Liquids provided via: Cup;Straw;Teaspoon Medication Administration: Via alternative means Supervision: Staff to assist with self feeding;Full supervision/cueing for compensatory strategies Compensations: Small sips/bites      Patient may use  Passy-Muir Speech Valve: During all waking hours (remove during sleep) PMSV Supervision: Intermittent MD: Please consider changing trach tube to : Smaller size         Oral Care Recommendations: Oral care BID;Oral care before and after PO Follow up Recommendations: LTACH Plan: Continue with current plan of care       GO                Ardyth Gal MA, CCC-SLP Acute Rehabilitation Services   10/04/2020, 10:17 AM

## 2020-10-04 NOTE — Progress Notes (Signed)
Nutrition Follow-up  DOCUMENTATION CODES:   Underweight, Severe malnutrition in context of chronic illness  INTERVENTION:   Continue tube feeds via J-tube: - Change to Jevity 1.5 @ 70 ml/hr (1680 ml/day) - ProSource TF 45 ml BID  Tube feeding regimen provides 2600 kcal, 129 grams of protein, and 1277 ml of H2O.  NUTRITION DIAGNOSIS:   Severe Malnutrition related to chronic illness (recurrent esophageal stricture s/p J-tube) as evidenced by severe muscle depletion, severe fat depletion.  Ongoing, being addressed via TF  GOAL:   Patient will meet greater than or equal to 90% of their needs  Met via TF  MONITOR:   PO intake, Diet advancement, Labs, Weight trends, TF tolerance, Skin, I & O's  REASON FOR ASSESSMENT:   Consult Enteral/tube feeding initiation and management  ASSESSMENT:   55 year old male who presented on 7/28 for esophagogastroduodenoscopy and robotic assisted esophagectomy. PMH of esophageal stricture s/p multiple dilations and J-tube placement in April 2022, stroke, CAD, MI, anxiety.  Noted pt started on a clear liquid diet on 8/26. Only 1 meal completion of 5% documented since that date. Per SLP note, pt with minimal PO intake. Today pt not agreeable to PO intake.  Spoke with RN prior to entering pt's room. Pt tolerating tube feeding with no drainage from around J-tube site. RN reports pt with multiple type 7 bowel movements which is really bothering pt. Noted pt with PRN imodium ordered. Recommended pt receive dose to help with diarrhea. RD will also change tube feeding formula to Jevity 1.5 (with fiber) in an attempt to bulk up stool.  Spoke with pt at bedside who states that he is having a rough day. Discussed plan to adjust tube feeding formula with pt who was appreciative.  First measured weight: 61 kg on 8/03 Current weight: 54.7 kg  Current TF: Vital 1.5 @ 70 ml/hr, ProSource TF 45 ml BID  Medications reviewed and include: melatonin, IV  protonix  Labs reviewed: sodium 132 CBG's: 96-107 x 24 hours  UOP: 1225 ml x 24 hours I/O's: +10.8 L since admit  Diet Order:   Diet Order             Diet clear liquid Room service appropriate? Yes; Fluid consistency: Thin  Diet effective now                   EDUCATION NEEDS:   No education needs have been identified at this time  Skin:  Skin Assessment:  Skin Integrity Issues: Stage II: sacrum Incisions: abdomen, R chest x 2  Last BM:  10/05/31 large type 7  Height:   Ht Readings from Last 1 Encounters:  09/07/20 _0  (1.727 m)    Weight:   Wt Readings from Last 1 Encounters:  10/04/20 54.7 kg    BMI:  Body mass index is 18.34 kg/m.  Estimated Nutritional Needs:   Kcal:  2423-5361 kcals  Protein:  115-135 grams  Fluid:  >/= 2.0 L    Gustavus Bryant, MS, RD, LDN Inpatient Clinical Dietitian Please see AMiON for contact information.

## 2020-10-04 NOTE — Progress Notes (Signed)
PT Cancellation Note  Patient Details Name: Ralph [name] MRN: [id number] DOB: 1965-12-20   Cancelled Treatment:    Reason Eval/Treat Not Completed: Patient declined, no reason specified. Pt reports he will work with therapy but not today because of continued diarrhea. Offered to help pt up to [facility] so he wouldn't need to be cleaned up as much much he deferred this as well.   Lyanne Co, PT  Acute Rehab Services  Pager [phone] Office [phone]     Lawana Chambers Erandy Mceachern 10/04/2020, 2:13 PM

## 2020-10-04 NOTE — Progress Notes (Addendum)
      [address]       [place] [address]             [phone]      27 Days Post-Op Procedure(s) (LRB): XI ROBOTIC ASSISTED THORASCOPY-DECORTICATION & REVISION OF ESOPHAGECTOMY (Right) ESOPHAGOGASTRODUODENOSCOPY (EGD) (N/A) INTERCOSTAL NERVE BLOCK (Right) Subjective: Concerned about multiple bouts of diarrhea.   Objective: Vital signs in last 24 hours: Temp:  [97.6 F (36.4 C)-98.1 F (36.7 C)] 97.6 F (36.4 C) (08/30 0711) Pulse Rate:  [51-73] 70 (08/30 0711) Cardiac Rhythm: Normal sinus rhythm (08/29 1913) Resp:  [14-21] 20 (08/30 0711) BP: (95-118)/(57-80) 112/80 (08/30 0711) SpO2:  [96 %-99 %] 96 % (08/30 0711) FiO2 (%):  [35 %] 35 % (08/30 0431) Weight:  [54.7 kg] 54.7 kg (08/30 0323)     Intake/Output from previous day: 08/29 0701 - 08/30 0700 In: 20 [I.V.:20] Out: 1225 [Urine:1225] Intake/Output this shift: No intake/output data recorded.  General appearance: alert, cooperative, and no distress Heart: regular rate and rhythm, S1, S2 normal, no murmur, click, rub or gallop Lungs: clear to auscultation bilaterally Abdomen: soft, some tenderness Extremities: extremities normal, atraumatic, no cyanosis or edema Wound: clean and dry  Lab Results: Recent Labs    10/03/20 0540  WBC 24.4*  HGB 10.4*  HCT 32.4*  PLT 502*   BMET:  Recent Labs    10/03/20 0540  NA 132*  K 4.1  CL 98  CO2 27  GLUCOSE 112*  BUN 12  CREATININE 0.35*  CALCIUM 9.0    PT/INR: No results for input(s): LABPROT, INR in the last 72 hours. ABG    Component Value Date/Time   PHART 7.414 09/08/2020 0444   HCO3 20.7 09/08/2020 0444   TCO2 22 09/08/2020 0444   ACIDBASEDEF 3.0 (H) 09/08/2020 0444   O2SAT 97.0 09/08/2020 0444   CBG (last 3)  Recent Labs    10/04/20 0008 10/04/20 0407 10/04/20 0724  GLUCAP 107* 100* 99    Assessment/Plan: S/P Procedure(s) (LRB): XI ROBOTIC ASSISTED THORASCOPY-DECORTICATION & REVISION OF ESOPHAGECTOMY  (Right) ESOPHAGOGASTRODUODENOSCOPY (EGD) (N/A) INTERCOSTAL NERVE BLOCK (Right)  1 afebrile, NSR in the 70s, BP well controlled, continue midodrine for BP support 2 sats good on TC at 28% Fi02, working with RT 3 good urine output, + frequent stooling ,TF's contributing 4 hyponatremia, sodium 132, creatinine stable at 0.35, other electrolytes okay.  5 Blood glucose well controlled 6 H and H stable 10.4/32.4 7 poss CIR if can participate, working with PT today.    LOS: 33 days    Sharlene Dory 10/04/2020   Overall doing well. Tolerating liquid diet and working with speech therapy. Continue physical therapy. Adjusting pain medication to get him off IV Dilaudid.  Dispo planning.

## 2020-10-05 DIAGNOSIS — Z930 Tracheostomy status: Secondary | ICD-10-CM

## 2020-10-05 LAB — GLUCOSE, CAPILLARY
Glucose-Capillary: 117 mg/dL — ABNORMAL HIGH (ref 70–99)
Glucose-Capillary: 102 mg/dL — ABNORMAL HIGH (ref 70–99)
Glucose-Capillary: 105 mg/dL — ABNORMAL HIGH (ref 70–99)
Glucose-Capillary: 96 mg/dL (ref 70–99)

## 2020-10-05 MED ORDER — OXYCODONE HCL 5 MG/5ML PO SOLN
5.0000 mg | Freq: Every evening | ORAL | Status: DC | PRN
  Administered 2020-10-05 – 2020-10-15 (×8): 5 mg
  Filled 2020-10-05 (×9): qty 5

## 2020-10-05 NOTE — Progress Notes (Signed)
NAME:  Ralph [name], MRN:  [id number], DOB:  August 08, 1965, LOS: 34 ADMISSION DATE:  09/01/2020, CONSULTATION DATE:  8/3 REFERRING MD:  Dr. Cliffton Asters, CHIEF COMPLAINT:  esophageal rupture, esophageal stricture    History of Present Illness:  Patient is a 55 yo M w/ PMH anxiety, CAD, MI, stroke presents to [facility] on 7/28 for esophagectomy w/ Dr. Cliffton Asters for esophageal stricture.  On 7/28, Dr. Cliffton Asters performed the esophagectomy without complication.  8/3 patient taken back to OR for esophageal rupture s/p esophagectomy by Dr. Cliffton Asters. Unable to extubate post procedure. Patient remained intubated on mechanical ventilation until tracheostomy was preformed 8/9.    See timeline below for full hospital events   Pertinent  Medical History  Anxiety  CAD MI Stroke  Significant Hospital Events:  7/28 admitted for planned esophagectomy. Chest tube was placed on right chest. J tube in place. 2 JP drains in place. Patient was extubated post procedure. Patient developed subcutaneous emphysema. Unable to use J tube due to leakage around site. 8/3: PCCM consulted for vent management after pt brought back to OR for esophageal rupture/ leak. Vanc/zosyn/fluconazole started. Hypotension noted post-op. Felt 2/2 Sepsis; treated w/ volume and pressors.  8/4: Still on norepinephrine.  Central venous pressure suggesting he is still hypovolemic so receiving fluid.  Spontaneous breathing trial initiated but not ready for extubation due to work of breathing.  culture sent from chest tube.  J-tube replaced in interventional radiology due to leaking.  Tube feeds resumed in the afternoon 8/5: J-tube site again leaking, tube feeds placed on hold around midnight.Still pressor dependent . Passed SBT. WOB improved.  Failed extubation and was reintubated 8/8: J-tube upsized 8/9: Tracheostomy 8/10 J-tube continues to leak. TF stopped. RT IJ CVC removed. 8/13 starting trickle feeds 8/15 bronch 8/16 pigtail to L fluid  collection 8/25 DG esophagus: Initial swallows with water-soluble contrast demonstrated no evidence of extravasation to suggest leak 8/27 transferred to PCU 8/29 able to phonate well w/ PMV.  Interim History / Subjective:   No distress, but is reporting pain particularly in his back and leg from pulmonary standpoint doing well, from a tracheostomy standpoint doing well.  He is very anxious seemingly at baseline I recall this being a conversation I had with his wife earlier during the hospitalization Objective   Blood pressure 128/73, pulse 70, temperature 98 F (36.7 C), temperature source Oral, resp. rate 18, height 5\' 8"  (1.727 m), weight 54.3 kg, SpO2 96 %.    FiO2 (%):  [28 %-35 %] 28 %   Intake/Output Summary (Last 24 hours) at 10/05/2020 0813 Last data filed at 10/05/2020 10/07/2020 Gross per 24 hour  Intake 30 ml  Output 1100 ml  Net -1070 ml   Filed Weights   10/03/20 0327 10/04/20 0323 10/05/20 0358  Weight: 57.2 kg 54.7 kg 54.3 kg    Examination: General 55 year old chronically ill-appearing white male he is resting in bed, he is uncomfortable today complaining of muscular skeletal which is somewhat chronic in nature involving his back and hip HEENT normocephalic with the exception of temporal wasting sclera are nonicteric mucous membranes are moist he has a size 6 cuffless tracheostomy in place Passy-Muir valve is in place phonation is strong, cough is strong.  No secretions. Pulmonary clear to auscultation currently humidified room air no accessory use diminished bases Cardiac regular rate Abdomen soft PEG intact Extremities warm dry poor muscle bulk Neuro awake anxious rapid speech no focal deficits GU voids   Resolved Hospital Problem list  Esophageal stricture  Constipation  Bradycardia, QTC 390, question vagal events-Cardiology Consulted >> stopped dobutamine 8/17 HAP (treated) _>grew pseudomonas Acute respiratory failure w/ hypoxia and hypercarbia requiring  mechanical ventilation L loculated effusion, pneumonia with necrosis Grew prevotella, enterococcus, Pigtail removed 8/20  Assessment & Plan:   Trach dependence s/p prolonged critical illness 2/2 esophageal rupture s/p planned esophagectomy, complicated by HCAP and pleural space infection (see above).  Severe protein calorie malnutrition, predates surgery   Sacral Pressure Wound  Depression  Pulmonary Problem Lost  Trach dependence s/p prolonged critical illness 2/2 esophageal rupture s/p planned esophagectomy  Plan/rec  Cont to encourage PMV as much as tolerated Defer diet advancement to surgical team Mobilize Routine trach care When he is stronger I think we can start capping trials but at this point he remains fairly debilitated. I am hopeful we may be able to do this when he is in in-pt rehab in next few weeks.   Best Practice   Per primary   Our service will continue to check in periodically twice a week with the plan to also follow him once he is excepted to acute rehab  [name] ACNP-BC [facility] Pager # [phone] OR # [phone] if no answer

## 2020-10-05 NOTE — Progress Notes (Signed)
Physical Therapy Treatment Patient Details Name: Ralph [name] MRN: [id number] DOB: 01-19-1966 Today's Date: 10/05/2020    History of Present Illness 55 y/o male admitted 7/28 for an Ivor-Lewis esophagectomy due to benign lower esophageal stricture and protein malnutrition.   8/3 pt returned to OR for esophageal rupture with exploration and repair with Jtube placement.  8/9 Tracheostomy.  PMHx: anxiety, CAD, MI, Stroke, multiple balloon dilations to esophageal stricture.    PT Comments    Pt making progress today.  He was able to ambulate in hallway with min A.  He is on 28% FiO2 via trach collar with VSS.  Continues to have decreased insight into deficits and requiring encouragement/education on benefits of activity. Updated recommendation from [facility] to CIR due to progress and noted CIR reviewing pt.     Follow Up Recommendations  CIR     Equipment Recommendations  Rolling walker with 5" wheels    Recommendations for Other Services       Precautions / Restrictions Precautions Precautions: Fall Precaution Comments: Trach, J tube    Mobility  Bed Mobility Overal bed mobility: Needs Assistance Bed Mobility: Rolling;Sidelying to Sit Rolling: Independent Sidelying to sit: Min guard       General bed mobility comments: Min guard for safety; bed flat    Transfers Overall transfer level: Needs assistance Equipment used: Rolling walker (2 wheeled) Transfers: Sit to/from Stand Sit to Stand: Min assist         General transfer comment: Performed x 2 with min A and cues for hand placement  Ambulation/Gait Ambulation/Gait assistance: Min assist Gait Distance (Feet): 40 Feet Assistive device: Rolling walker (2 wheeled) Gait Pattern/deviations: Step-to pattern Gait velocity: decreased   General Gait Details: Pt able to ambulate out in the hall with min A to stabilize; Cues for RW use.   Stairs             Wheelchair Mobility    Modified Rankin (Stroke Patients  Only)       Balance Overall balance assessment: Needs assistance Sitting-balance support: No upper extremity supported;Bilateral upper extremity supported Sitting balance-Leahy Scale: Fair Sitting balance - Comments: kyphotic, prefers use of UE's, but could sit without UE support   Standing balance support: Bilateral upper extremity supported Standing balance-Leahy Scale: Poor Standing balance comment: requiring RW                            Cognition Arousal/Alertness: Awake/alert Behavior During Therapy: Flat affect Overall Cognitive Status: No family/caregiver present to determine baseline cognitive functioning                                 General Comments: Pt with decreased awareness of deficits and safety awareness.  States "why do ya'll want me to get up an move so much"      Exercises      General Comments General comments (skin integrity, edema, etc.): Pt on 5 L 28% FiO2 with VSS during session.  Pt agreed to therapy but stating "I'm not weak, I don't know why yall want me to move around so much." Explained roll of therapy and benefits of activity and risk of inactivity      Pertinent Vitals/Pain Pain Assessment: No/denies pain    Home Living  Prior Function            PT Goals (current goals can now be found in the care plan section) Progress towards PT goals: Progressing toward goals    Frequency    Min 3X/week      PT Plan Discharge plan needs to be updated    Co-evaluation              AM-PAC PT "6 Clicks" Mobility   Outcome Measure  Help needed turning from your back to your side while in a flat bed without using bedrails?: A Little Help needed moving from lying on your back to sitting on the side of a flat bed without using bedrails?: A Little Help needed moving to and from a bed to a chair (including a wheelchair)?: A Little Help needed standing up from a chair using your arms  (e.g., wheelchair or bedside chair)?: A Little Help needed to walk in hospital room?: A Little Help needed climbing 3-5 steps with a railing? : A Lot 6 Click Score: 17    End of Session Equipment Utilized During Treatment: Oxygen;Gait belt Activity Tolerance: Patient tolerated treatment well Patient left: in bed;with call bell/phone within reach;with bed alarm set (request break from SCDs) Nurse Communication: Mobility status PT Visit Diagnosis: Other abnormalities of gait and mobility (R26.89);Muscle weakness (generalized) (M62.81);Difficulty in walking, not elsewhere classified (R26.2)     Time: 1230-1250 PT Time Calculation (min) (ACUTE ONLY): 20 min  Charges:  $Gait Training: 8-22 mins                     Anise Salvo, PT Acute Rehab Services Pager [phone] [facility] [phone]    Rayetta Humphrey 10/05/2020, 1:54 PM

## 2020-10-05 NOTE — Progress Notes (Addendum)
      [address]       [place] [address]             [phone]      28 Days Post-Op Procedure(s) (LRB): XI ROBOTIC ASSISTED THORASCOPY-DECORTICATION & REVISION OF ESOPHAGECTOMY (Right) ESOPHAGOGASTRODUODENOSCOPY (EGD) (N/A) INTERCOSTAL NERVE BLOCK (Right) Subjective: He is having a lot of chronic pain this morning and last night mostly in his lower back.   Objective: Vital signs in last 24 hours: Temp:  [97.8 F (36.6 C)-98.3 F (36.8 C)] 98 F (36.7 C) (08/31 0722) Pulse Rate:  [58-73] 73 (08/31 0722) Cardiac Rhythm: Normal sinus rhythm (08/31 0703) Resp:  [15-24] 18 (08/31 0722) BP: (102-146)/(61-96) 128/73 (08/31 0722) SpO2:  [96 %-98 %] 98 % (08/31 0722) FiO2 (%):  [28 %-35 %] 28 % (08/31 0358) Weight:  [54.3 kg] 54.3 kg (08/31 0358)     Intake/Output from previous day: 08/30 0701 - 08/31 0700 In: 30 [I.V.:30] Out: 1100 [Urine:1100] Intake/Output this shift: No intake/output data recorded.  General appearance: alert, cooperative, and no distress Heart: regular rate and rhythm, S1, S2 normal, no murmur, click, rub or gallop Lungs: clear to auscultation bilaterally Abdomen: soft, non-tender; bowel sounds normal; no masses,  no organomegaly Extremities: extremities normal, atraumatic, no cyanosis or edema Wound: clean and dry  Lab Results: Recent Labs    10/03/20 0540  WBC 24.4*  HGB 10.4*  HCT 32.4*  PLT 502*   BMET:  Recent Labs    10/03/20 0540  NA 132*  K 4.1  CL 98  CO2 27  GLUCOSE 112*  BUN 12  CREATININE 0.35*  CALCIUM 9.0    PT/INR: No results for input(s): LABPROT, INR in the last 72 hours. ABG    Component Value Date/Time   PHART 7.414 09/08/2020 0444   HCO3 20.7 09/08/2020 0444   TCO2 22 09/08/2020 0444   ACIDBASEDEF 3.0 (H) 09/08/2020 0444   O2SAT 97.0 09/08/2020 0444   CBG (last 3)  Recent Labs    10/04/20 2353 10/05/20 0356 10/05/20 0720  GLUCAP 103* 117* 102*    Assessment/Plan: S/P  Procedure(s) (LRB): XI ROBOTIC ASSISTED THORASCOPY-DECORTICATION & REVISION OF ESOPHAGECTOMY (Right) ESOPHAGOGASTRODUODENOSCOPY (EGD) (N/A) INTERCOSTAL NERVE BLOCK (Right)  1 afebrile, NSR in the 70s, BP much improved, continue midodrine for BP support 2 sats good on TC at 28% Fi02, working with RT 3 good urine output, + frequent stools, imodium on board,TF's contributing 4 hyponatremia, sodium 132, creatinine stable at 0.35, other electrolytes okay.  5 Blood glucose well controlled 6 H and H stable 10.4/32.4 7 poss CIR if can participate, working with PT today 8 chronic pain, he was on oxycodone at home. Will continue hycet during the day and make a one-time night dose of oxycodone available at night for his back pain  Plan: Continue to encourage work with PT. Will order labs for tomorrow. Working on pain control today.    LOS: 34 days    Sharlene Dory 10/05/2020  Continue PT PMV per pulm Will hold off on advancing diet for now Continue tube feeds Dispo planning  Ralph [name] [name]

## 2020-10-05 NOTE — Progress Notes (Signed)
Inpatient Rehab Admissions Coordinator:   I continue to follow for potential CIR admission, but Pt. Will need to consistently work with therapies before we can consider him for admission.   Megan Salon, MS, CCC-SLP Rehab Admissions Coordinator  [phone] (celll) [phone] (office)

## 2020-10-06 LAB — BASIC METABOLIC PANEL
Anion gap: 11 (ref 5–15)
BUN: 12 mg/dL (ref 6–20)
CO2: 30 mmol/L (ref 22–32)
Calcium: 9.3 mg/dL (ref 8.9–10.3)
Chloride: 93 mmol/L — ABNORMAL LOW (ref 98–111)
Creatinine, Ser: 0.41 mg/dL — ABNORMAL LOW (ref 0.61–1.24)
GFR, Estimated: >60 mL/min (ref >60)
Glucose, Bld: 122 mg/dL — ABNORMAL HIGH (ref 70–99)
Potassium: 3.9 mmol/L (ref 3.5–5.1)
Sodium: 134 mmol/L — ABNORMAL LOW (ref 135–145)

## 2020-10-06 LAB — CBC
HCT: 32.5 % — ABNORMAL LOW (ref 39.0–52.0)
Hemoglobin: 10.4 g/dL — ABNORMAL LOW (ref 13.0–17.0)
MCH: 29.4 pg (ref 26.0–34.0)
MCHC: 32 g/dL (ref 30.0–36.0)
MCV: 91.8 fL (ref 80.0–100.0)
Platelets: 472 10*3/uL — ABNORMAL HIGH (ref 150–400)
RBC: 3.54 MIL/uL — ABNORMAL LOW (ref 4.22–5.81)
RDW: 15.2 % (ref 11.5–15.5)
WBC: 12.3 10*3/uL — ABNORMAL HIGH (ref 4.0–10.5)
nRBC: 0 % (ref 0.0–0.2)

## 2020-10-06 NOTE — Progress Notes (Signed)
  Speech Language Pathology Treatment: Ralph [name] Speaking valve (respiratory strength training)  Patient Details Name: Ralph [name] MRN: [id number] DOB: 11/29/1965 Today's Date: 10/06/2020 Time:  -     Assessment / Plan / Recommendation Clinical Impression  SLP followed up for PMSV tolerance and initiation of respiratory muscle strength training (per Dr. Cliffton Asters, no contraindications for RMT candidacy). Pt with PMSV in place prior to SLP arrival, pt with excellent PMSV tolerance. Pt however with weaker volitional cough, unable to expel secretions with cough to tracheal region or orally. RT provided pt suction. Pt completed expiratory muscle strength training at 5 cm/H2O x 10 repetitions. SLP provided cueing for proper sequencing and audible displacement of air was achieved with cueing. Pt continues to require max encouragement for participation with therapy (initially stating would not work with SLP until received pain meds; RN administered; pt agreeable afterwards). SLP to follow up. Pt also noted to decline liquid PO intake remains very limited. Per most recent MD notes, diet advancement not indicated until trach decannulation.     HPI HPI: 55 y/o male admitted 7/28 for an Ivor-Lewis esophagectomy due to benign lower esophageal stricture and protein malnutrition.   8/3 pt returned to OR for esophageal rupture with exploration and repair with Jtube replacement. He remained intubated 7/28-8/5; reintubated 8/5 until trach 8/9.  Had episodes of asymptomatic asystole on 8/17 and bradycardia. Cardiology believes these may be vagally mediated.  PMHx: anxiety, CAD, MI, Stroke, multiple balloon dilations to esophageal stricture.      SLP Plan  Continue with current plan of care       Recommendations                   Plan: Continue with current plan of care       GO                Ralph Gal MA, CCC-SLP Acute Rehabilitation Services   10/06/2020, 1:00 PM

## 2020-10-06 NOTE — Evaluation (Addendum)
Occupational Therapy Evaluation Patient Details Name: Ralph [name] MRN: [id number] DOB: 04/26/1965 Today's Date: 10/06/2020    History of Present Illness 55 y/o male admitted 7/28 for an Ivor-Lewis esophagectomy due to benign lower esophageal stricture and protein malnutrition.   8/3 pt returned to OR for esophageal rupture with exploration and repair with Jtube placement.  8/9 Tracheostomy.  PMHx: anxiety, CAD, MI, Stroke, multiple balloon dilations to esophageal stricture.   Clinical Impression   PTA, pt lives with significant other in second floor motel room. Previously, pt reports Independence with all daily tasks and without use of AD. On 3rd OT attempt (requesting pain meds on 2nd attempt), pt agreeable for OOB activities. Overall, pt Supervision for bed mobility, Min guard for standing activities at bedside with RW. Pt requires Min A for UB ADLs and Mod A for LB ADLs due to deficits noted below. Pt with some decreased awareness of deficits as he reports no difficulty completing any tasks. Educated on plan for UE HEP education and mobility to/from bathroom during next OT session with pt agreeable. Pt does endorse desire for CIR admission (educated pt on increased rehab frequency/duration at [facility] and need for consistent participation).   SpO2 WFL on 5 L O2, 28% FiO2 BP 131/91 sitting EOB, endorses initial dizziness     Follow Up Recommendations  CIR    Equipment Recommendations  Other (comment) (Rolling walker if does not alredy have)    Recommendations for Other Services Rehab consult     Precautions / Restrictions Precautions Precautions: Fall Precaution Comments: Trach, J tube Restrictions Weight Bearing Restrictions: No      Mobility Bed Mobility Overal bed mobility: Needs Assistance Bed Mobility: Supine to Sit;Sit to Supine     Supine to sit: Supervision;HOB elevated Sit to supine: Supervision   General bed mobility comments: no assist needed    Transfers Overall  transfer level: Needs assistance Equipment used: Rolling walker (2 wheeled) Transfers: Sit to/from Stand Sit to Stand: Min guard         General transfer comment: able to stand with RW at bedside > 2 min without external support needed    Balance Overall balance assessment: Needs assistance Sitting-balance support: No upper extremity supported;Bilateral upper extremity supported Sitting balance-Leahy Scale: Fair Sitting balance - Comments: kyphotic, prefers use of UE's, but could sit without UE support   Standing balance support: Bilateral upper extremity supported Standing balance-Leahy Scale: Poor Standing balance comment: requiring RW                           ADL either performed or assessed with clinical judgement   ADL Overall ADL's : Needs assistance/impaired Eating/Feeding: Set up;Sitting   Grooming: Set up;Sitting   Upper Body Bathing: Minimal assistance;Sitting   Lower Body Bathing: Moderate assistance;Sitting/lateral leans;Sit to/from stand   Upper Body Dressing : Minimal assistance;Sitting   Lower Body Dressing: Moderate assistance;Sitting/lateral leans;Sit to/from stand Lower Body Dressing Details (indicate cue type and reason): able to bring feet to self in bed to reach socks, good flexibility Toilet Transfer: Min guard;Stand-pivot;RW   Toileting- Clothing Manipulation and Hygiene: Moderate assistance;Sit to/from stand         General ADL Comments: Pt limited by decreased strength and endurance, as well as pain. Pt physically able to move pretty well for short periods of time     Vision Baseline Vision/History: 0 No visual deficits Ability to See in Adequate Light: 0 Adequate Patient Visual Report: No  change from baseline Vision Assessment?: No apparent visual deficits     Perception     Praxis      Pertinent Vitals/Pain Pain Assessment: 0-10 Pain Score: 8  Pain Location: abdomen, then back Pain Descriptors / Indicators:  Constant;Sore;Guarding Pain Intervention(s): Monitored during session;Limited activity within patient's tolerance;Premedicated before session;Repositioned     Hand Dominance Right   Extremity/Trunk Assessment Upper Extremity Assessment Upper Extremity Assessment: Generalized weakness   Lower Extremity Assessment Lower Extremity Assessment: Defer to PT evaluation   Cervical / Trunk Assessment Cervical / Trunk Assessment: Kyphotic   Communication Communication Communication: No difficulties   Cognition Arousal/Alertness: Awake/alert Behavior During Therapy: Flat affect Overall Cognitive Status: No family/caregiver present to determine baseline cognitive functioning                                 General Comments: Pt with decreased awareness of deficits and safety awareness.  Pt unsure why therapies want continuous OOB activity as he reports he can do everything he needs to. Pt reports he would be fine at home if he discharged tomorrow - when OT asked about long term functioning without assist   General Comments  BP 131/91 sitting EOB as pt reports initially dizzy. Dizziness subsided in standing. SpO2 96% on 5 L O2, 28% FiO2, HR WFL    Exercises     Shoulder Instructions      Home Living Family/patient expects to be discharged to:: Private residence Living Arrangements: Spouse/significant other Available Help at Discharge: Family;Available PRN/intermittently Type of Home: Other(Comment) (motel) Home Access: Stairs to enter [employer] of Steps: 12   Home Layout: One level     Bathroom Shower/Tub: Chief Strategy Officer: Standard   How Accessible: Accessible via walker Home Equipment: Walker - 2 wheels   Additional Comments: Has been staying in motel due to in between houses. Flight of steps up to motel room, no elevator and unable to get first floor motel room (apparently wife already tried to ask)  Lives With: Spouse    Prior  Functioning/Environment Level of Independence: Independent        Comments: Independent with ADLs, IADLs, and mobility.        OT Problem List: Decreased strength;Decreased activity tolerance;Impaired balance (sitting and/or standing);Cardiopulmonary status limiting activity;Pain      OT Treatment/Interventions: Self-care/ADL training;Therapeutic exercise;Energy conservation;DME and/or AE instruction;Therapeutic activities;Patient/family education;Balance training    OT Goals(Current goals can be found in the care plan section) Acute Rehab OT Goals Patient Stated Goal: rest OT Goal Formulation: With patient Time For Goal Achievement: 10/20/20 Potential to Achieve Goals: Good  OT Frequency: Min 2X/week   Barriers to D/C:            Co-evaluation              AM-PAC OT "6 Clicks" Daily Activity     Outcome Measure Help from another person eating meals?: A Little Help from another person taking care of personal grooming?: A Little Help from another person toileting, which includes using toliet, bedpan, or urinal?: A Lot Help from another person bathing (including washing, rinsing, drying)?: A Lot Help from another person to put on and taking off regular upper body clothing?: A Little Help from another person to put on and taking off regular lower body clothing?: A Lot 6 Click Score: 15   End of Session Equipment Utilized During Treatment: Rolling walker;Oxygen Nurse Communication: Mobility  status;Other (comment) (condom cath,pain meds)  Activity Tolerance: Patient limited by pain Patient left: in bed;with call bell/phone within reach;with bed alarm set  OT Visit Diagnosis: Unsteadiness on feet (R26.81);Other abnormalities of gait and mobility (R26.89);Muscle weakness (generalized) (M62.81);Pain Pain - part of body:  (abdomen)                Time: 1250-1305 OT Time Calculation (min): 15 min Charges:  OT General Charges $OT Visit: 1 Visit OT Evaluation $OT Eval  Moderate Complexity: 1 Mod  Bradd Canary, OTR/L Acute Rehab Services Office: [phone]   Lorre Munroe 10/06/2020, 1:47 PM

## 2020-10-06 NOTE — Progress Notes (Addendum)
      [address]       [name] [address]             [phone]      29 Days Post-Op Procedure(s) (LRB): XI ROBOTIC ASSISTED THORASCOPY-DECORTICATION & REVISION OF ESOPHAGECTOMY (Right) ESOPHAGOGASTRODUODENOSCOPY (EGD) (N/A) INTERCOSTAL NERVE BLOCK (Right)  Subjective:  Patient awoken from sleep. Continues to have pain, relief is "so,so."  He participated with PT yesterday.  Objective: Vital signs in last 24 hours: Temp:  [97.5 F (36.4 C)-98.2 F (36.8 C)] 98.2 F (36.8 C) (09/01 0419) Pulse Rate:  [54-78] 65 (09/01 0419) Cardiac Rhythm: Normal sinus rhythm (09/01 0700) Resp:  [14-22] 16 (09/01 0419) BP: (102-129)/(64-87) 129/87 (09/01 0419) SpO2:  [94 %-98 %] 94 % (09/01 0419) FiO2 (%):  [21 %-28 %] 21 % (09/01 0356) Weight:  [54.6 kg] 54.6 kg (09/01 0608)  Intake/Output from previous day: 08/31 0701 - 09/01 0700 In: 130 [I.V.:30; NG/GT:100] Out: 825 [Urine:825]  General appearance: alert and malnourished Heart: regular rate and rhythm Lungs: clear to auscultation bilaterally, TC remains in place Abdomen: soft, non-tender, non-distended, + hyperactive BS Extremities: extremities normal, atraumatic, no cyanosis or edema Wound: well healed  Lab Results: Recent Labs    10/06/20 0420  WBC 12.3*  HGB 10.4*  HCT 32.5*  PLT 472*   BMET:  Recent Labs    10/06/20 0420  NA 134*  K 3.9  CL 93*  CO2 30  GLUCOSE 122*  BUN 12  CREATININE 0.41*  CALCIUM 9.3    PT/INR: No results for input(s): LABPROT, INR in the last 72 hours. ABG    Component Value Date/Time   PHART 7.414 09/08/2020 0444   HCO3 20.7 09/08/2020 0444   TCO2 22 09/08/2020 0444   ACIDBASEDEF 3.0 (H) 09/08/2020 0444   O2SAT 97.0 09/08/2020 0444   CBG (last 3)  Recent Labs    10/05/20 0720 10/05/20 1119 10/05/20 1628  GLUCAP 102* 105* 96    Assessment/Plan: S/P Procedure(s) (LRB): XI ROBOTIC ASSISTED THORASCOPY-DECORTICATION & REVISION OF ESOPHAGECTOMY  (Right) ESOPHAGOGASTRODUODENOSCOPY (EGD) (N/A) INTERCOSTAL NERVE BLOCK (Right)  CV- NSR, BP is improved on Midodrine Pulm- remains on TC 5L/min, continue to attempt weaning, tolerating PMV Renal-creatinine, lytes okay, good urinary output... Hyponatremia continues to improve up to 134 GI- diarrhea due to continual tube feedings, and liquid diet.. Immodium has been ordered, continue diet for now, will discuss advancement plan with Dr. Cliffton Asters Pain control- chronic back pain from prior to surgery, on home night oxycodone regimen, also has Hycet prn through the day... Lidocaine patches do not work Deconditioning- severe, patient is also malnourished, needs a lot of encouragement to get up and ambulate, working with PT/OT... planning for CIR Dispo- patient stable, continue to work on optimizing respiratory status and weaning off TC, SBP improved with Midodrine, Hyponatremia improved, continue PT/OT   LOS: 35 days   Ralph Dandy, PA-C 10/06/2020   Agree with above Dispo planning PMV trials Continue tube feeds.  Will not advance diet until trach is out.  [name] Scrape

## 2020-10-06 NOTE — Progress Notes (Signed)
OT Cancellation Note  Patient Details Name: JURELL BASISTA MRN: [id number] DOB: 02-17-1965   Cancelled Treatment:    Reason Eval/Treat Not Completed: Patient declined, no reason specified Attempted to initiate OT eval this AM though pt declined, reporting preference to sleep at this time. Pt requested OT come back 12-1230PM for OOB activities. Will follow-up as schedule permits.   Lorre Munroe 10/06/2020, 9:17 AM

## 2020-10-07 NOTE — Progress Notes (Signed)
Inpatient Rehab Admissions Coordinator:   I spoke with Pt. Regarding potential CIR admit. He is stating that he wants to go home; however, Pt. With trach/peg and lots of medical needs that would make discharge home unsafe. I will follow up with Pt. This weekend for ongoing discussion regarding need for rehab.  ] Megan Salon, MS, CCC-SLP Rehab Admissions Coordinator  [phone] (celll) [phone] (office)

## 2020-10-07 NOTE — Progress Notes (Addendum)
      [address]       [place] [address]             [phone]       30 Days Post-Op Procedure(s) (LRB): XI ROBOTIC ASSISTED THORASCOPY-DECORTICATION & REVISION OF ESOPHAGECTOMY (Right) ESOPHAGOGASTRODUODENOSCOPY (EGD) (N/A) INTERCOSTAL NERVE BLOCK (Right)  Subjective: Patient still with diarrhea but not as bad as previously. He is asking for pain medication this am. He denies nausea.  Objective: Vital signs in last 24 hours: Temp:  [97.8 F (36.6 C)-98 F (36.7 C)] 98 F (36.7 C) (09/02 0307) Pulse Rate:  [60-76] 60 (09/02 0353) Cardiac Rhythm: Sinus bradycardia (09/01 1900) Resp:  [13-22] 14 (09/02 0353) BP: (101-124)/(58-74) 101/58 (09/02 0307) SpO2:  [93 %-97 %] 94 % (09/02 0353) FiO2 (%):  [21 %] 21 % (09/02 0353) Weight:  [53.5 kg] 53.5 kg (09/02 0543)     Intake/Output from previous day: 09/01 0701 - 09/02 0700 In: 850 [I.V.:10; NG/GT:840] Out: 535 [Urine:535]   Physical Exam:  General: Appears older than stated age, alert, oriented Cardiovascular: RRR Pulmonary: Clear to auscultation bilaterally;trach collar Abdomen: Soft, non tender, bowel sounds present. Extremities: No lower extremity edema. Wounds: Clean and dry.  No erythema or signs of infection.   Lab Results: CBC: Recent Labs    10/06/20 0420  WBC 12.3*  HGB 10.4*  HCT 32.5*  PLT 472*   BMET:  Recent Labs    10/06/20 0420  NA 134*  K 3.9  CL 93*  CO2 30  GLUCOSE 122*  BUN 12  CREATININE 0.41*  CALCIUM 9.3    PT/INR: No results for input(s): LABPROT, INR in the last 72 hours. ABG:  INR: Will add last result for INR, ABG once components are confirmed Will add last 4 CBG results once components are confirmed  Assessment/Plan:  1. CV - SR/SB at times. On Midodrine 10 mg tid and at hs;SBP low 100's. 2.  Pulmonary - On trach collar at 5 L, continue PMV trials. 3. CBGs 102/105/96. On TFs 4. GI-Per speech recommendations, continue with clear liquids and  TFs. Imodium PRN diarrhea 5. Anemia of chronic disease-H and H yesterday stable at 10.4 and 32.5 6. Thrombocytosis-platelets yesterday down to 472,000 7. Deconditioned-continue with PT/OT 8. Regarding pain control, patient with a history of chronic back pain prior to surgery. He was on IV Dilaudid for a while. Continue Hycet, Oxy per tube PRN. 9. Continue to try to optimize respiratory status and weaning to trach collar;will need placement when ready, likely LTAC  Ralph M ZimmermanPA-C 10/07/2020,6:56 AM   Agree with above. Continue physical therapy. Continue pulmonary toilet Dispo planning  Ralph [name]

## 2020-10-07 NOTE — Progress Notes (Signed)
Physical Therapy Treatment Patient Details Name: Ralph [name] MRN: [id number] DOB: 08/11/1965 Today's Date: 10/07/2020    History of Present Illness 55 y/o male admitted 7/28 for an Ivor-Lewis esophagectomy due to benign lower esophageal stricture and protein malnutrition.   8/3 pt returned to OR for esophageal rupture with exploration and repair with Jtube placement.  8/9 Tracheostomy.  PMHx: anxiety, CAD, MI, Stroke, multiple balloon dilations to esophageal stricture.    PT Comments    Pt demonstrated progress with mobility. Incr amb distance but still requires hands on assist and fatigues quickly. Continue to feel pt needs CIR for further rehab prior to return home.    Follow Up Recommendations  CIR     Equipment Recommendations  Rolling walker with 5" wheels    Recommendations for Other Services       Precautions / Restrictions Precautions Precautions: Fall Precaution Comments: Trach, J tube    Mobility  Bed Mobility Overal bed mobility: Needs Assistance Bed Mobility: Supine to Sit;Sit to Supine   Sidelying to sit: Supervision;HOB elevated Supine to sit: Supervision;HOB elevated     General bed mobility comments: supervision for lines    Transfers Overall transfer level: Needs assistance Equipment used: Rolling walker (2 wheeled) Transfers: Sit to/from Stand Sit to Stand: Min guard         General transfer comment: Assist for balance to rise.  Ambulation/Gait Ambulation/Gait assistance: Min assist Gait Distance (Feet): 90 Feet Assistive device: Rolling walker (2 wheeled) Gait Pattern/deviations: Step-through pattern;Decreased step length - left;Decreased step length - right;Trunk flexed Gait velocity: decreased Gait velocity interpretation: <1.31 ft/sec, indicative of household ambulator General Gait Details: Knees remain slightly flexed during gait. Assist for balance and support.   Stairs             Wheelchair Mobility    Modified Rankin  (Stroke Patients Only)       Balance Overall balance assessment: Needs assistance Sitting-balance support: No upper extremity supported;Bilateral upper extremity supported Sitting balance-Leahy Scale: Fair     Standing balance support: Bilateral upper extremity supported Standing balance-Leahy Scale: Poor Standing balance comment: walker and min guard for static standing                            Cognition Arousal/Alertness: Awake/alert Behavior During Therapy: Flat affect Overall Cognitive Status: No family/caregiver present to determine baseline cognitive functioning                                 General Comments: Poor insight into deficits      Exercises      General Comments General comments (skin integrity, edema, etc.): Pt amb on RA with SpO2 >90%.      Pertinent Vitals/Pain Pain Assessment: Faces Faces Pain Scale: No hurt    Home Living                      Prior Function            PT Goals (current goals can now be found in the care plan section) Progress towards PT goals: Progressing toward goals    Frequency    Min 3X/week      PT Plan Current plan remains appropriate    Co-evaluation              AM-PAC PT "6 Clicks" Mobility   Outcome Measure  Help needed turning from your back to your side while in a flat bed without using bedrails?: None Help needed moving from lying on your back to sitting on the side of a flat bed without using bedrails?: A Little Help needed moving to and from a bed to a chair (including a wheelchair)?: A Little Help needed standing up from a chair using your arms (e.g., wheelchair or bedside chair)?: A Little Help needed to walk in hospital room?: A Little Help needed climbing 3-5 steps with a railing? : A Lot 6 Click Score: 18    End of Session Equipment Utilized During Treatment: Gait belt Activity Tolerance: Patient tolerated treatment well Patient left: in bed;with  call bell/phone within reach;with bed alarm set Nurse Communication: Mobility status PT Visit Diagnosis: Other abnormalities of gait and mobility (R26.89);Muscle weakness (generalized) (M62.81);Difficulty in walking, not elsewhere classified (R26.2)     Time: 9233-0076 PT Time Calculation (min) (ACUTE ONLY): 16 min  Charges:  $Gait Training: 8-22 mins                     [facility] PT Acute Rehabilitation Services Pager [phone] Office [phone]    Ralph [name] [facility] 10/07/2020, 1:25 PM

## 2020-10-08 LAB — BASIC METABOLIC PANEL
Anion gap: 7 (ref 5–15)
BUN: 15 mg/dL (ref 6–20)
CO2: 31 mmol/L (ref 22–32)
Calcium: 9.2 mg/dL (ref 8.9–10.3)
Chloride: 98 mmol/L (ref 98–111)
Creatinine, Ser: 0.43 mg/dL — ABNORMAL LOW (ref 0.61–1.24)
GFR, Estimated: >60 mL/min (ref >60)
Glucose, Bld: 112 mg/dL — ABNORMAL HIGH (ref 70–99)
Potassium: 4 mmol/L (ref 3.5–5.1)
Sodium: 136 mmol/L (ref 135–145)

## 2020-10-08 NOTE — Progress Notes (Signed)
Inpatient Rehab Admissions Coordinator:   I spoke with pt. Today and explained that he is not safe to d/c home at this time and that he will require some rehab and additional care. He states understanding and is agreeable to that. I was also able to get Pt.'s significant other over the phone and she stated that she works 7 days a week and is away for 10-12 hours at a time. She is not able to provide 24/7 support, which would be needed for Pt. To be accepted to CIR. I will not pursue CIR admit for this pt. Due to lack of support at d/c. Pt. Will likely need SNF for short term rehab vs. LTACH.   Megan Salon, MS, CCC-SLP Rehab Admissions Coordinator  [phone] (celll) [phone] (office)

## 2020-10-08 NOTE — TOC Progression Note (Signed)
Transition of Care [facility]) - Progression Note    Patient Details  Name: Ralph [name] MRN: [id number] Date of Birth: 06-Oct-1965  [facility]) CM/SW Contact  [name], RN Phone Number: 10/08/2020, 3:05 PM  Clinical Narrative:    Notified by secure chat that patient is not a candidate for CIR. CSW continues SNF search, currently there are no accepting facilities. Reviewed hospital account and has revenue codes to support LTAC inquiry. Although medicaid, since he is managed medicaid there is a slight chance LTAC can contract coverage. [facility] liaison will know more after the Labor Day holiday weekend on Tuesday.  Awaiting response from [facility].   Unable to reach family to update. [name],[name] (Significant other)  [phone] (Mobile)    Expected Discharge Plan:  (Vent/SNF) Barriers to Discharge: Continued Medical Work up  Expected Discharge Plan and Services Expected Discharge Plan:  (Vent/SNF) In-house Referral: Clinical Social Work     Living arrangements for the past 2 months: Single Family Home                                       Social Determinants of Health (SDOH) Interventions    Readmission Risk Interventions No flowsheet data found.

## 2020-10-08 NOTE — Progress Notes (Signed)
31 Days Post-Op Procedure(s) (LRB): XI ROBOTIC ASSISTED THORASCOPY-DECORTICATION & REVISION OF ESOPHAGECTOMY (Right) ESOPHAGOGASTRODUODENOSCOPY (EGD) (N/A) INTERCOSTAL NERVE BLOCK (Right) Subjective: Diarrhea improved  Objective: Vital signs in last 24 hours: Temp:  [97.7 F (36.5 C)-98.2 F (36.8 C)] 97.7 F (36.5 C) (09/03 0731) Pulse Rate:  [61-72] 61 (09/03 0841) Cardiac Rhythm: Normal sinus rhythm (09/03 0700) Resp:  [16-20] 16 (09/03 0841) BP: (106-123)/(65-78) 107/68 (09/03 0731) SpO2:  [92 %-96 %] 94 % (09/03 0841) FiO2 (%):  [21 %] 21 % (09/03 0841) Weight:  [57 kg] 57 kg (09/03 0409)  Hemodynamic parameters for last 24 hours:    Intake/Output from previous day: 09/02 0701 - 09/03 0700 In: 10 [I.V.:10] Out: 725 [Urine:725] Intake/Output this shift: Total I/O In: 150 [Other:80; NG/GT:70] Out: -   General appearance: chronically ill appearing, NAD Neurologic: intact Heart: regular rate and rhythm Lungs: clear to auscultation bilaterally  Lab Results: Recent Labs    10/06/20 0420  WBC 12.3*  HGB 10.4*  HCT 32.5*  PLT 472*   BMET:  Recent Labs    10/06/20 0420 10/08/20 0500  NA 134* 136  K 3.9 4.0  CL 93* 98  CO2 30 31  GLUCOSE 122* 112*  BUN 12 15  CREATININE 0.41* 0.43*  CALCIUM 9.3 9.2    PT/INR: No results for input(s): LABPROT, INR in the last 72 hours. ABG    Component Value Date/Time   PHART 7.414 09/08/2020 0444   HCO3 20.7 09/08/2020 0444   TCO2 22 09/08/2020 0444   ACIDBASEDEF 3.0 (H) 09/08/2020 0444   O2SAT 97.0 09/08/2020 0444   CBG (last 3)  Recent Labs    10/05/20 1119 10/05/20 1628  GLUCAP 105* 96    Assessment/Plan: S/P Procedure(s) (LRB): XI ROBOTIC ASSISTED THORASCOPY-DECORTICATION & REVISION OF ESOPHAGECTOMY (Right) ESOPHAGOGASTRODUODENOSCOPY (EGD) (N/A) INTERCOSTAL NERVE BLOCK (Right) - In SR, BP Ok on midodrine On Trach collar, status stable CBG well controlled On Tf + clear liquids Pain control better  today   LOS: 37 days    Loreli Slot 10/08/2020

## 2020-10-09 MED ORDER — HYDROCODONE-ACETAMINOPHEN 7.5-325 MG/15ML PO SOLN
15.0000 mL | ORAL | Status: DC | PRN
  Administered 2020-10-09 – 2020-10-25 (×85): 15 mL
  Filled 2020-10-09 (×87): qty 15

## 2020-10-10 DIAGNOSIS — J9601 Acute respiratory failure with hypoxia: Secondary | ICD-10-CM | POA: Diagnosis not present

## 2020-10-10 LAB — CBC
HCT: 32.8 % — ABNORMAL LOW (ref 39.0–52.0)
Hemoglobin: 10.7 g/dL — ABNORMAL LOW (ref 13.0–17.0)
MCH: 29.6 pg (ref 26.0–34.0)
MCHC: 32.6 g/dL (ref 30.0–36.0)
MCV: 90.6 fL (ref 80.0–100.0)
Platelets: 345 10*3/uL (ref 150–400)
RBC: 3.62 MIL/uL — ABNORMAL LOW (ref 4.22–5.81)
RDW: 15 % (ref 11.5–15.5)
WBC: 11.4 10*3/uL — ABNORMAL HIGH (ref 4.0–10.5)
nRBC: 0 % (ref 0.0–0.2)

## 2020-10-10 NOTE — Progress Notes (Signed)
Physical Therapy Treatment Patient Details Name: Ralph [name] MRN: [id number] DOB: 1965-09-17 Today's Date: 10/10/2020    History of Present Illness 55 y/o male admitted 7/28 for an Ivor-Lewis esophagectomy due to benign lower esophageal stricture and protein malnutrition.   8/3 pt returned to OR for esophageal rupture with exploration and repair with Jtube placement.  8/9 Tracheostomy.  PMHx: anxiety, CAD, MI, Stroke, multiple balloon dilations to esophageal stricture.    PT Comments    The pt presents agreeable to session after premedication this AM. He demos good independence with bed mobility, but continues to require cues and guarding for standing activities due to deficits in static and dynamic standing balance. The pt was able to complete one bout of hallway ambulation, and initially agreeable to second bout, but after seated rest, asked to return to bed reporting fatigue. Will continue to benefit from skilled PT to progress endurance, power, strength, and establish HEP to be completed outside of therapy sessions for improved mobility.    Follow Up Recommendations  SNF;Supervision/Assistance - 24 hour     Equipment Recommendations  Rolling walker with 5" wheels    Recommendations for Other Services       Precautions / Restrictions Precautions Precautions: Fall Precaution Comments: Trach, J tube Restrictions Weight Bearing Restrictions: No    Mobility  Bed Mobility Overal bed mobility: Needs Assistance Bed Mobility: Rolling;Sidelying to Sit;Sit to Supine Rolling: Independent Sidelying to sit: Supervision   Sit to supine: Supervision   General bed mobility comments: supervision with verbal cues    Transfers Overall transfer level: Needs assistance Equipment used: Rolling walker (2 wheeled) Transfers: Sit to/from Stand Sit to Stand: Min guard         General transfer comment: minG for safety, no overt LOB  Ambulation/Gait Ambulation/Gait assistance: Min  guard Gait Distance (Feet): 80 Feet Assistive device: Rolling walker (2 wheeled) Gait Pattern/deviations: Step-through pattern;Decreased step length - left;Decreased step length - right;Trunk flexed Gait velocity: decreased Gait velocity interpretation: <1.31 ft/sec, indicative of household ambulator General Gait Details: minG for safety, no overt LOB, pt with slow ut steady pace, mild LOB with attempt to gesture with UE while walking       Balance Overall balance assessment: Needs assistance Sitting-balance support: No upper extremity supported;Bilateral upper extremity supported Sitting balance-Leahy Scale: Fair Sitting balance - Comments: kyphotic, prefers use of UE's, but could sit without UE support   Standing balance support: Bilateral upper extremity supported Standing balance-Leahy Scale: Poor Standing balance comment: can static stand without UE support, reliant on BUE support for gait                            Cognition Arousal/Alertness: Awake/alert Behavior During Therapy: Flat affect Overall Cognitive Status: No family/caregiver present to determine baseline cognitive functioning                                 General Comments: pt with flat affect, agreeable to session, responding to cues approriately      Exercises      General Comments General comments (skin integrity, edema, etc.): VSS on 5L 28% through session.      Pertinent Vitals/Pain Pain Assessment: Faces Faces Pain Scale: Hurts little more Pain Location: abdomen, then back Pain Descriptors / Indicators: Constant;Sore;Guarding Pain Intervention(s): Limited activity within patient's tolerance;Monitored during session;Repositioned;Premedicated before session     PT Goals (current  goals can now be found in the care plan section) Acute Rehab PT Goals Patient Stated Goal: "to keep working on the stuff we are already doing" PT Goal Formulation: Patient unable to participate  in goal setting Time For Goal Achievement: 10/24/20 Potential to Achieve Goals: Good Progress towards PT goals: Progressing toward goals    Frequency    Min 3X/week      PT Plan Discharge plan needs to be updated       AM-PAC PT "6 Clicks" Mobility   Outcome Measure  Help needed turning from your back to your side while in a flat bed without using bedrails?: None Help needed moving from lying on your back to sitting on the side of a flat bed without using bedrails?: A Little Help needed moving to and from a bed to a chair (including a wheelchair)?: A Little Help needed standing up from a chair using your arms (e.g., wheelchair or bedside chair)?: A Little Help needed to walk in hospital room?: A Little Help needed climbing 3-5 steps with a railing? : A Lot 6 Click Score: 18    End of Session Equipment Utilized During Treatment: Gait belt;Oxygen Activity Tolerance: Patient limited by fatigue Patient left: in bed;with call bell/phone within reach;with bed alarm set Nurse Communication: Mobility status PT Visit Diagnosis: Other abnormalities of gait and mobility (R26.89);Muscle weakness (generalized) (M62.81);Difficulty in walking, not elsewhere classified (R26.2)     Time: 6568-1275 PT Time Calculation (min) (ACUTE ONLY): 35 min  Charges:  $Gait Training: 8-22 mins $Therapeutic Activity: 8-22 mins                     Vickki Muff, PT, DPT   Acute Rehabilitation Department Pager #: [phone]   Ronnie Derby 10/10/2020, 9:47 AM

## 2020-10-10 NOTE — Procedures (Signed)
Tracheostomy Change Note  Patient Details:   Name: Ralph [name] DOB: 1965-08-28 MRN: [id number]    Airway Documentation:     Evaluation  O2 sats: stable throughout Complications: No apparent complications Patient did tolerate procedure well. Bilateral Breath Sounds: Clear, Diminished   Trach change completed. Downsized from a #6 cuffless to a #4 cuffless Shiley Flexible Trach.  Good color change with the ETCO2 detector. Bilateral BS.  [name] 10/10/2020, 1:17 PM

## 2020-10-10 NOTE — Progress Notes (Signed)
10/10/2020   I have seen and evaluated the patient for trach care.  S:  No events, having some diarrhea.  Pain seems controlled.  Using PMV during day.  O: Blood pressure (!) 90/58, pulse 62, temperature 98 F (36.7 C), resp. rate 17, height 5\' 8"  (1.727 m), weight 54.4 kg, SpO2 94 %.  Thin man NAD Lungs exam scattered rhonci PMV in place over trach Ext with muscle wasting J tube in place  No new imaging.  A:  Postop respiratory failure requiring tracheostomy due to HCAP and deconditioning  P:  I think he is steadily improving, let's try a 4-0 cuffless trach and consider capping trial tomorrow if does okay   MD [place] [facility] Prefer epic messenger for cross cover needs If after hours, please call E-link

## 2020-10-10 NOTE — Progress Notes (Addendum)
      [address]       [place] [address]             [phone]      33 Days Post-Op Procedure(s) (LRB): XI ROBOTIC ASSISTED THORASCOPY-DECORTICATION & REVISION OF ESOPHAGECTOMY (Right) ESOPHAGOGASTRODUODENOSCOPY (EGD) (N/A) INTERCOSTAL NERVE BLOCK (Right) Subjective: Awake and alert, says he is still having some abdominal soreness his last surgery.  He continues to have loose bowel movements that he is unable to control.  Objective: Vital signs in last 24 hours: Temp:  [97.9 F (36.6 C)-98.2 F (36.8 C)] 98 F (36.7 C) (09/05 0732) Pulse Rate:  [61-70] 62 (09/05 0822) Cardiac Rhythm: Heart block (09/05 0700) Resp:  [15-24] 17 (09/05 0822) BP: (90-122)/(58-84) 90/58 (09/05 0822) SpO2:  [93 %-96 %] 94 % (09/05 0822) FiO2 (%):  [21 %] 21 % (09/05 1000) Weight:  [54.4 kg] 54.4 kg (09/05 0344)  Hemodynamic parameters for last 24 hours:    Intake/Output from previous day: 09/04 0701 - 09/05 0700 In: 20  Out: 620 [Urine:620] Intake/Output this shift: No intake/output data recorded.  General appearance: alert, cooperative, and mild distress Neurologic: intact Heart: regular rate and rhythm Lungs: Breath sounds are clear bilaterally Abdomen: Soft, there J-tube site is dry.  He has mild diffuse tenderness. Wound: Intact and dry  Lab Results: Recent Labs    10/10/20 0520  WBC 11.4*  HGB 10.7*  HCT 32.8*  PLT 345   BMET:  Recent Labs    10/08/20 0500  NA 136  K 4.0  CL 98  CO2 31  GLUCOSE 112*  BUN 15  CREATININE 0.43*  CALCIUM 9.2    PT/INR: No results for input(s): LABPROT, INR in the last 72 hours. ABG    Component Value Date/Time   PHART 7.414 09/08/2020 0444   HCO3 20.7 09/08/2020 0444   TCO2 22 09/08/2020 0444   ACIDBASEDEF 3.0 (H) 09/08/2020 0444   O2SAT 97.0 09/08/2020 0444   CBG (last 3)  No results for input(s): GLUCAP in the last 72 hours.  Assessment/Plan: S/P Procedure(s) (LRB): XI ROBOTIC ASSISTED  THORASCOPY-DECORTICATION & REVISION OF ESOPHAGECTOMY (Right) ESOPHAGOGASTRODUODENOSCOPY (EGD) (N/A) INTERCOSTAL NERVE BLOCK (Right)  -33 days post revision of esophagectomy after esophageal rupture following Ivor Lewis esophagectomy.  He later had a tracheostomy for persistent ventilator-dependent respiratory insufficiency.  Nutrition continues to be supported by way of the J-tube.  Critical care team is downsizing the trach and considering a trach Trial tomorrow.  -CV cardiac rhythm is stable.  Blood pressure continues to require support with midodrine.  -Renal-function is stable  -Heme-expected acute blood loss anemia, hematocrit is stable.  -DVT prophylaxis-continue daily subcu enoxaparin  -Disposition-social worker notes reviewed.  Patient is not a candidate for CIR.  SNF search continues, there is an outside chance that he may qualify for LTAC since he has "managed" Medicaid coverage.  Assistance from the [facility] team is appreciated.   LOS: 39 days    Leary Roca [phone] 10/10/2020   Agree with above.  Patient continues to do well.  Has not required any suctioning in several days. Appreciate pulmonary for assistance with trach. Continue tube feeds. Dispo planning.  Sunita Demond Keane Scrape

## 2020-10-10 NOTE — Progress Notes (Signed)
  Speech Language Pathology Treatment: [name] Speaking valve  Patient Details Name: Ralph [name] MRN: [id number] DOB: 05/13/1965 Today's Date: 10/10/2020 Time: 0945-1000 SLP Time Calculation (min) (ACUTE ONLY): 15 min  Assessment / Plan / Recommendation Clinical Impression  Continued SLP instruction for respiratory muscle strength training to assist weaning and improve patient candidacy for decannulation. Pt with PMSV in place, tolerating during wake hours without difficulty. Pt still requires prn tracheal suction and exhibits inability to expel secretions with volitional cough at trach site or orally. Pt completed expiratory muscle strength training 5 sets of 5 breaths at 7 cm/H2O, requiring mild cueing for proper sequencing for audible displacement of airflow. Pt continues to decline POs during SLP interaction (clear liquid trays noted at bedside untouched). Will continue to follow.    HPI HPI: 54 y/o male admitted 7/28 for an Ivor-Lewis esophagectomy due to benign lower esophageal stricture and protein malnutrition.   8/3 pt returned to OR for esophageal rupture with exploration and repair with Jtube replacement. He remained intubated 7/28-8/5; reintubated 8/5 until trach 8/9.  Had episodes of asymptomatic asystole on 8/17 and bradycardia. Cardiology believes these may be vagally mediated.  PMHx: anxiety, CAD, MI, Stroke, multiple balloon dilations to esophageal stricture.      SLP Plan  Continue with current plan of care       Recommendations  Diet recommendations:  (clear liquid per surgery recommendations) Liquids provided via: Cup;Straw;Teaspoon Medication Administration: Via alternative means      Patient may use Passy-Muir Speech Valve: During all waking hours (remove during sleep) PMSV Supervision: Intermittent MD: Please consider changing trach tube to : Smaller size         Oral Care Recommendations: Oral care BID;Oral care before and after PO Follow up Recommendations:  LTACH SLP Visit Diagnosis: Aphonia (R49.1) Plan: Continue with current plan of care       GO                Ardyth Gal MA, CCC-SLP Acute Rehabilitation Services   10/10/2020, 10:07 AM

## 2020-10-11 DIAGNOSIS — J9601 Acute respiratory failure with hypoxia: Secondary | ICD-10-CM | POA: Diagnosis not present

## 2020-10-11 LAB — BASIC METABOLIC PANEL
Anion gap: 6 (ref 5–15)
BUN: 13 mg/dL (ref 6–20)
CO2: 29 mmol/L (ref 22–32)
Calcium: 9.2 mg/dL (ref 8.9–10.3)
Chloride: 99 mmol/L (ref 98–111)
Creatinine, Ser: 0.44 mg/dL — ABNORMAL LOW (ref 0.61–1.24)
GFR, Estimated: >60 mL/min (ref >60)
Glucose, Bld: 107 mg/dL — ABNORMAL HIGH (ref 70–99)
Potassium: 3.9 mmol/L (ref 3.5–5.1)
Sodium: 134 mmol/L — ABNORMAL LOW (ref 135–145)

## 2020-10-11 MED ORDER — JEVITY 1.5 CAL/FIBER PO LIQD
1000.0000 mL | ORAL | Status: DC
  Administered 2020-10-11 – 2020-10-15 (×6): 1000 mL
  Filled 2020-10-11 (×11): qty 1000

## 2020-10-11 MED ORDER — OSMOLITE 1.5 CAL PO LIQD: 1000.0000 mL | ORAL | Status: DC

## 2020-10-11 MED ORDER — PROSOURCE TF PO LIQD
45.0000 mL | Freq: Three times a day (TID) | ORAL | Status: DC
  Administered 2020-10-11 – 2020-10-25 (×42): 45 mL
  Filled 2020-10-11 (×42): qty 45

## 2020-10-11 MED ORDER — INFLUENZA VAC SPLIT QUAD 0.5 ML IM SUSY
0.5000 mL | PREFILLED_SYRINGE | INTRAMUSCULAR | Status: DC
  Filled 2020-10-11 (×2): qty 0.5

## 2020-10-11 MED ORDER — JEVITY 1.5 CAL/FIBER PO LIQD
1000.0000 mL | ORAL | Status: DC
  Filled 2020-10-11: qty 1000

## 2020-10-11 NOTE — Progress Notes (Addendum)
      [address]       [place] [address]             [phone]      34 Days Post-Op Procedure(s) (LRB): XI ROBOTIC ASSISTED THORASCOPY-DECORTICATION & REVISION OF ESOPHAGECTOMY (Right) ESOPHAGOGASTRODUODENOSCOPY (EGD) (N/A) INTERCOSTAL NERVE BLOCK (Right) Subjective: [name] states that the imodium is helping slow downhis diarrhea but it is still an issue. He walked half the unit yesterday without issue. He wants to go home.   Objective: Vital signs in last 24 hours: Temp:  [97.7 F (36.5 C)-98 F (36.7 C)] 97.8 F (36.6 C) (09/06 0708) Pulse Rate:  [55-77] 62 (09/06 0708) Cardiac Rhythm: Normal sinus rhythm (09/06 0700) Resp:  [15-23] 15 (09/06 0708) BP: (90-126)/(58-84) 110/76 (09/06 0708) SpO2:  [92 %-96 %] 95 % (09/06 0708) FiO2 (%):  [21 %] 21 % (09/06 0442) Weight:  [57.6 kg] 57.6 kg (09/06 0500)     Intake/Output from previous day: 09/05 0701 - 09/06 0700 In: -  Out: 300 [Urine:300] Intake/Output this shift: No intake/output data recorded.  General appearance: alert, cooperative, and no distress Heart: regular rate and rhythm, S1, S2 normal, no murmur, click, rub or gallop Lungs: clear to auscultation bilaterally Abdomen: soft, non-tender; bowel sounds normal; no masses,  no organomegaly Extremities: extremities normal, atraumatic, no cyanosis or edema Wound: clean and dry  Lab Results: Recent Labs    10/10/20 0520  WBC 11.4*  HGB 10.7*  HCT 32.8*  PLT 345   BMET:  Recent Labs    10/11/20 0453  NA 134*  K 3.9  CL 99  CO2 29  GLUCOSE 107*  BUN 13  CREATININE 0.44*  CALCIUM 9.2    PT/INR: No results for input(s): LABPROT, INR in the last 72 hours. ABG    Component Value Date/Time   PHART 7.414 09/08/2020 0444   HCO3 20.7 09/08/2020 0444   TCO2 22 09/08/2020 0444   ACIDBASEDEF 3.0 (H) 09/08/2020 0444   O2SAT 97.0 09/08/2020 0444   CBG (last 3)  No results for input(s): GLUCAP in the last 72  hours.  Assessment/Plan: S/P Procedure(s) (LRB): XI ROBOTIC ASSISTED THORASCOPY-DECORTICATION & REVISION OF ESOPHAGECTOMY (Right) ESOPHAGOGASTRODUODENOSCOPY (EGD) (N/A) INTERCOSTAL NERVE BLOCK (Right)  -34 days post revision of esophagectomy after esophageal rupture following Ivor Lewis esophagectomy.  He later had a tracheostomy for persistent ventilator-dependent respiratory insufficiency.  Nutrition continues to be supported by way of the J-tube.  Critical care team is downsizing the trach and considering a trach Trial tomorrow.   -CV cardiac rhythm is stable.  NSR in the 60s. Blood pressure continues to require support with midodrine.Stable.   -Renal-function is stable   -Heme-expected acute blood loss anemia, hematocrit is stable.   -DVT prophylaxis-continue daily subcu enoxaparin   -Disposition- Patient is not a candidate for CIR.  SNF search continues, there is an outside chance that he may qualify for LTAC since he has "managed" Medicaid coverage.  Assistance from the [facility] team is appreciated. Now the patient is asking to go home, however I do not think this is the safest option.      LOS: 40 days    Sharlene Dory 10/11/2020   No events Tolerating tubefeeds, and clears Working on decannulation.  Appreciate Pulm Dispo planning  Ralph [name]

## 2020-10-11 NOTE — Progress Notes (Signed)
OT Cancellation Note  Patient Details Name: KYI ROMANELLO MRN: [id number] DOB: [birth year]   Cancelled Treatment:    Reason Eval/Treat Not Completed: Patient declined, no reason specified Despite coordination with RN for pain meds, pt adamantly declines OT session. Pt requests OT to come back around noon due to not sleeping well last night. Noted CIR denial, so will update recs to SNF vs LTACH. Will follow-up again for OT session as schedule permits.  Lorre Munroe 10/11/2020, 9:15 AM

## 2020-10-11 NOTE — Progress Notes (Signed)
Nutrition Follow-up  DOCUMENTATION CODES:   Underweight, Severe malnutrition in context of chronic illness  INTERVENTION:   Continue tube feeds via J-tube: - Jevity 1.5 @ 70 ml/hr (1680 ml/day) - ProSource TF 45 ml TID   Tube feeding regimen provides 2640 kcal, 140 grams of protein, and 1277 ml of H2O.  NUTRITION DIAGNOSIS:   Severe Malnutrition related to chronic illness (recurrent esophageal stricture s/p J-tube) as evidenced by severe muscle depletion, severe fat depletion.  Ongoing, being addressed via TF  GOAL:   Patient will meet greater than or equal to 90% of their needs  Met via TF  MONITOR:   PO intake, Diet advancement, Labs, Weight trends, TF tolerance, Skin, I & O's  REASON FOR ASSESSMENT:   Consult Enteral/tube feeding initiation and management  ASSESSMENT:   55 year old male who presented on 7/28 for esophagogastroduodenoscopy and robotic assisted esophagectomy. PMH of esophageal stricture s/p multiple dilations and J-tube placement in April 2022, stroke, CAD, MI, anxiety.  8/26 - clear liquids  Pt remains on clear liquid diet. No meal completions charted since last RD visit. Pt reports that he is consuming some of the items off of his clear liquid tray. RD noted untouched clear liquid tray at bedside. Pt states that he does not like anything that is currently on the tray. Attempted to obtain pt's preferences for future meal trays but pt declined. Per MD notes, no diet advancement until pt is decannulated. Pulmonary following for possible trach capping trial.  Pt endorses ongoing diarrhea but states that it has improved since transitioning to Jevity 1.5 formula which contains fiber. RN reports skin breakdown in perineal region. Pt with imodium ordered PRN and RN is aware.  First measured weight: 61 kg on 8/03 Current weight: 57.6 kg  Medications reviewed and include: melatonin, IV protonix  Labs reviewed: sodium 134  Diet Order:   Diet Order              Diet clear liquid Room service appropriate? Yes; Fluid consistency: Thin  Diet effective now                   EDUCATION NEEDS:   No education needs have been identified at this time  Skin:  Skin Assessment: Skin Integrity Issues: Stage II: sacrum Incisions: abdomen, R chest x 2  Last BM:  10/11/20  Height:   Ht Readings from Last 1 Encounters:  10/06/20 5' 8" (1.727 m)    Weight:   Wt Readings from Last 1 Encounters:  10/11/20 57.6 kg    BMI:  Body mass index is 19.31 kg/m.  Estimated Nutritional Needs:   Kcal:  7681-1572 kcals  Protein:  115-135 g  Fluid:  >/= 2.0 L    Gustavus Bryant, MS, RD, LDN Inpatient Clinical Dietitian Please see AMiON for contact information.

## 2020-10-11 NOTE — Progress Notes (Signed)
Pt trach capped at this time per physician order. Pt tolerated well, RN notified of changes. RT will continue to monitor and be available.

## 2020-10-11 NOTE — Progress Notes (Signed)
OT Cancellation Note  Patient Details Name: Ralph [name] MRN: [id number] DOB: 1965/07/19   Cancelled Treatment:    Reason Eval/Treat Not Completed: Patient declined, no reason specified Attempted OT session for second time today with consideration of pt preference for afternoon treatment and premedication. However, pt declined again, citing "not feeling well". Pt reports "I know I need to get up and move, but if I do that right now, I think I will feel worse.". Will follow-up again as schedule permits. Per department policy, 3 consecutive refusals would warrant therapy to sign off.   Lorre Munroe 10/11/2020, 2:09 PM

## 2020-10-11 NOTE — Progress Notes (Signed)
10/11/2020   I have seen and evaluated the patient for trach care.  S:  No events, tolerating 4 cuffless.   Denies SOB.  PMV in place.  O: Blood pressure 117/76, pulse 64, temperature 97.8 F (36.6 C), temperature source Oral, resp. rate 19, height 5\' 8"  (1.727 m), weight 57.6 kg, SpO2 95 %.  Thin man NAD Lungs exam scattered rhonci, improved from yesterday PMV in place over trach Ext with muscle wasting J tube in place  No new imaging.  A:  Postop respiratory failure requiring tracheostomy due to HCAP and deconditioning  P:  Start capping trial today Decannulate Thursday if no issues, will see again Thursday  Saturday MD [facility] Prefer epic messenger for cross cover needs If after hours, please call E-link

## 2020-10-11 NOTE — Progress Notes (Signed)
RN notified by dietary that Jevity 1.5 is out of stock in pharmacy. Pt TF to be changed temporarily to Osmolite 1.5.

## 2020-10-11 NOTE — TOC Progression Note (Addendum)
Transition of Care [facility]) - Progression Note    Patient Details  Name: Ralph [name] MRN: [id number] Date of Birth: Mar 26, 1965  [facility] CM/SW Contact  Leone Haven, RN Phone Number: 10/11/2020, 1:26 PM  Clinical Narrative:    NCM spoke with patient regarding ltach's, he states he does not want to go to [facility] because that is where his father passed away.  Irving Burton at [facility] could have started [place] but he does not want to go there.  Jennifer with [facility] states since he is not participating in therapy they can not take referral. NCM informed Denny Peon PA of this information. Patient may have to go to a SNF. His trach was put in on 8/9, which is in capping trial right now, may try to downsize  trach.     Expected Discharge Plan:  (Vent/SNF) Barriers to Discharge: Continued Medical Work up  Expected Discharge Plan and Services Expected Discharge Plan:  (Vent/SNF) In-house Referral: Clinical Social Work     Living arrangements for the past 2 months: Single Family Home                                       Social Determinants of Health (SDOH) Interventions    Readmission Risk Interventions No flowsheet data found.

## 2020-10-12 NOTE — Progress Notes (Signed)
PT Cancellation Note  Patient Details Name: Ralph [name] MRN: [id number] DOB: December 13, 1965   Cancelled Treatment:    Reason Eval/Treat Not Completed: Patient declined, no reason specified. "Not now," with no other explanation despite encouragement and that I would not be returning later.    Angelina Ok [facility] 10/12/2020, 11:59 AM Skip Mayer PT Acute Rehabilitation Services Pager [phone] Office [phone]

## 2020-10-12 NOTE — Progress Notes (Addendum)
Occupational Therapy Treatment Patient Details Name: Ralph [name] MRN: [id number] DOB: Jul 14, 1965 Today's Date: 10/12/2020    History of present illness 55 y/o male admitted 7/28 for an Ivor-Lewis esophagectomy due to benign lower esophageal stricture and protein malnutrition.   8/3 pt returned to OR for esophageal rupture with exploration and repair with Jtube placement.  8/9 Tracheostomy.  PMHx: anxiety, CAD, MI, Stroke, multiple balloon dilations to esophageal stricture.   OT comments  Pt requiring maximum encouragement to participate, but eventually agreed to sit EOB and participate in grooming  with supervision and work on sit to stands x 3 from EOB with min assist and no AD. Updated d/c recommendation to SNF, pt not likely to tolerate 3 hours a day of rehab.   Follow Up Recommendations  SNF   Equipment Recommendations       Recommendations for Other Services      Precautions / Restrictions Precautions Precautions: Fall Precaution Comments: capped trach, J tube       Mobility Bed Mobility Overal bed mobility: Needs Assistance Bed Mobility: Rolling;Sidelying to Sit;Sit to Sidelying Rolling: Independent Sidelying to sit: Supervision     Sit to sidelying: Supervision General bed mobility comments: cues for technique and assist for lines    Transfers Overall transfer level: Needs assistance Equipment used: 1 person hand held assist Transfers: Sit to/from Stand Sit to Stand: Min assist         General transfer comment: steadying assist x 3 from EOB without AD    Balance Overall balance assessment: Needs assistance   Sitting balance-Leahy Scale: Good Sitting balance - Comments: during functional activity     Standing balance-Leahy Scale: Poor Standing balance comment: one hand assist for sit<>stand,steading assist                           ADL either performed or assessed with clinical judgement   ADL Overall ADL's : Needs assistance/impaired      Grooming: Wash/dry hands;Wash/dry face;Oral care;Sitting;Set up;Brushing hair                                 General ADL Comments: Pt declined to ambulate or sit up in chair. Agreed to working on sit<>stand at EOB.     Vision       Perception     Praxis      Cognition Arousal/Alertness: Awake/alert Behavior During Therapy: Flat affect Overall Cognitive Status: No family/caregiver present to determine baseline cognitive functioning                                 General Comments: pt with poor insight into benefits of OOB activity        Exercises     Shoulder Instructions       General Comments      Pertinent Vitals/ Pain       Pain Assessment: Faces Faces Pain Scale: No hurt  Home Living                                          Prior Functioning/Environment              Frequency  Min 2X/week        Progress  Toward Goals  OT Goals(current goals can now be found in the care plan section)  Progress towards OT goals: Progressing toward goals  Acute Rehab OT Goals Patient Stated Goal: "to keep working on the stuff we are already doing" OT Goal Formulation: With patient Time For Goal Achievement: 10/20/20 Potential to Achieve Goals: Good  Plan Discharge plan remains appropriate    Co-evaluation                 AM-PAC OT "6 Clicks" Daily Activity     Outcome Measure   Help from another person eating meals?: None Help from another person taking care of personal grooming?: A Little Help from another person toileting, which includes using toliet, bedpan, or urinal?: A Lot Help from another person bathing (including washing, rinsing, drying)?: A Lot Help from another person to put on and taking off regular upper body clothing?: A Little Help from another person to put on and taking off regular lower body clothing?: A Lot 6 Click Score: 16    End of Session    OT Visit Diagnosis:  Unsteadiness on feet (R26.81);Other abnormalities of gait and mobility (R26.89);Muscle weakness (generalized) (M62.81);Pain   Activity Tolerance Other (comment) (self limiting)   Patient Left in bed;with call bell/phone within reach;with bed alarm set   Nurse Communication          Time: 1440-1456 OT Time Calculation (min): 16 min  Charges: OT General Charges $OT Visit: 1 Visit OT Treatments $Self Care/Home Management : 8-22 mins  Martie Round, OTR/L Acute Rehabilitation Services Pager: [phone] Office: [phone]    Evern Bio 10/12/2020, 3:14 PM

## 2020-10-12 NOTE — TOC Progression Note (Signed)
Transition of Care [facility]) - Progression Note    Patient Details  Name: Ralph [name] MRN: [id number] Date of Birth: September 04, 1965  Transition of Care [facility]) CM/SW Contact  Ivette Loyal, [place] Phone Number: 10/12/2020, 9:57 AM  Clinical Narrative:    CSW followed up with [facility] for SNF bed with Medicaid, they are currently not taking and Medicaid for short term at the moment. CSW was advised to try [facility], CSW faxed pt to [facility] and will follow up for availability.    Expected Discharge Plan:  (Vent/SNF) Barriers to Discharge: Continued Medical Work up  Expected Discharge Plan and Services Expected Discharge Plan:  (Vent/SNF) In-house Referral: Clinical Social Work     Living arrangements for the past 2 months: Single Family Home                                       Social Determinants of Health (SDOH) Interventions    Readmission Risk Interventions No flowsheet data found.

## 2020-10-12 NOTE — Progress Notes (Signed)
  Speech Language Pathology Treatment: Dysphagia  Patient Details Name: Ralph [name] MRN: [id number] DOB: [birth year] Today's Date: 10/12/2020 Time: 1500-1510 SLP Time Calculation (min) (ACUTE ONLY): 10 min  Assessment / Plan / Recommendation Clinical Impression  Pt seen for assessment of diet tolerance, readiness to advance textures, and PMSV education. Upon arrival of SLP, it was noted that pt's trach had been capped in preparation for decannulation. Pt was observed drinking thin liquid, and did not exhibit overt s/s aspiration. Secure chat with PA indicates pt may be able to advance textures soon. Will continue to follow.    HPI HPI: 55 y/o male admitted 7/28 for an Ivor-Lewis esophagectomy due to benign lower esophageal stricture and protein malnutrition.   8/3 pt returned to OR for esophageal rupture with exploration and repair with Jtube replacement. He remained intubated 7/28-8/5; reintubated 8/5 until trach 8/9.  Had episodes of asymptomatic asystole on 8/17 and bradycardia. Cardiology believes these may be vagally mediated.  PMHx: anxiety, CAD, MI, Stroke, multiple balloon dilations to esophageal stricture.      SLP Plan  Continue with current plan of care       Recommendations  Diet recommendations: Thin liquid;Other(comment) (clear liquid) Liquids provided via: Cup;Straw;Teaspoon Medication Administration: Via alternative means Supervision: Patient able to self feed Compensations: Small sips/bites Postural Changes and/or Swallow Maneuvers: Seated upright 90 degrees                Oral Care Recommendations: Oral care BID Plan: Continue with current plan of care       GO               Grace Haggart B. Murvin Natal, [facility], CCC-SLP Speech Language Pathologist Office: [phone]  Leigh Aurora 10/12/2020, 3:50 PM

## 2020-10-12 NOTE — Progress Notes (Signed)
       [address]       [place] [address]             [phone]        35 Days Post-Op Procedure(s) (LRB): XI ROBOTIC ASSISTED THORASCOPY-DECORTICATION & REVISION OF ESOPHAGECTOMY (Right) ESOPHAGOGASTRODUODENOSCOPY (EGD) (N/A) INTERCOSTAL NERVE BLOCK (Right) Subjective: Feels okay this morning, no complaints.   Objective: Vital signs in last 24 hours: Temp:  [97.5 F (36.4 C)-97.9 F (36.6 C)] 97.5 F (36.4 C) (09/07 0436) Pulse Rate:  [57-68] 64 (09/07 0721) Cardiac Rhythm: Normal sinus rhythm (09/07 0436) Resp:  [12-20] 18 (09/07 0721) BP: (109-124)/(71-84) 124/84 (09/07 0436) SpO2:  [92 %-95 %] 94 % (09/07 0721) FiO2 (%):  [21 %] 21 % (09/07 0721) Weight:  [58.6 kg] 58.6 kg (09/07 0436)    Intake/Output from previous day: 09/06 0701 - 09/07 0700 In: 2961.7 [I.V.:20; NG/GT:2671.7] Out: 450 [Urine:450] Intake/Output this shift: No intake/output data recorded.  General appearance: alert, cooperative, and no distress Heart: regular rate and rhythm, S1, S2 normal, no murmur, click, rub or gallop Lungs: clear to auscultation bilaterally Abdomen: soft, non-tender; bowel sounds normal; no masses,  no organomegaly Extremities: extremities normal, atraumatic, no cyanosis or edema Wound: clean and dry  Lab Results: Recent Labs    10/10/20 0520  WBC 11.4*  HGB 10.7*  HCT 32.8*  PLT 345   BMET:  Recent Labs    10/11/20 0453  NA 134*  K 3.9  CL 99  CO2 29  GLUCOSE 107*  BUN 13  CREATININE 0.44*  CALCIUM 9.2    PT/INR: No results for input(s): LABPROT, INR in the last 72 hours. ABG    Component Value Date/Time   PHART 7.414 09/08/2020 0444   HCO3 20.7 09/08/2020 0444   TCO2 22 09/08/2020 0444   ACIDBASEDEF 3.0 (H) 09/08/2020 0444   O2SAT 97.0 09/08/2020 0444   CBG (last 3)  No results for input(s): GLUCAP in the last 72 hours.  Assessment/Plan: S/P Procedure(s) (LRB): XI ROBOTIC ASSISTED THORASCOPY-DECORTICATION & REVISION  OF ESOPHAGECTOMY (Right) ESOPHAGOGASTRODUODENOSCOPY (EGD) (N/A) INTERCOSTAL NERVE BLOCK (Right)  35 days post revision of esophagectomy after esophageal rupture following Ivor Lewis esophagectomy.  He later had a tracheostomy for persistent ventilator-dependent respiratory insufficiency.  Nutrition continues to be supported by way of the J-tube.  Critical care team is downsizing the trach and considering decannulation Thursday.    -CV cardiac rhythm is stable.  NSR in the 60s. Blood pressure continues to require support with midodrine.Stable.   -Renal-function is stable   -Heme-expected acute blood loss anemia, hematocrit is stable.   -DVT prophylaxis-continue daily subcu enoxaparin   -Disposition- Patient is not a candidate for CIR.  SNF search continues, there is an outside chance that he may qualify for LTAC since he has "managed" Medicaid coverage.  Assistance from the [facility] team is appreciated. He is not interested in [facility] because his father passed away there and he does not qualify for [facility].    LOS: 41 days    Sharlene Dory 10/12/2020

## 2020-10-13 DIAGNOSIS — Z430 Encounter for attention to tracheostomy: Secondary | ICD-10-CM | POA: Diagnosis not present

## 2020-10-13 DIAGNOSIS — J9601 Acute respiratory failure with hypoxia: Secondary | ICD-10-CM | POA: Diagnosis not present

## 2020-10-13 LAB — FUNGAL ORGANISM REFLEX

## 2020-10-13 LAB — FUNGUS CULTURE RESULT

## 2020-10-13 LAB — FUNGUS CULTURE WITH STAIN

## 2020-10-13 MED ORDER — LOPERAMIDE HCL 2 MG PO CAPS
2.0000 mg | ORAL_CAPSULE | ORAL | Status: DC | PRN
  Administered 2020-10-13 – 2020-10-16 (×7): 2 mg via ORAL
  Filled 2020-10-13 (×8): qty 1

## 2020-10-13 MED ORDER — ENSURE ENLIVE PO LIQD
237.0000 mL | Freq: Three times a day (TID) | ORAL | Status: DC
  Administered 2020-10-13 – 2020-10-17 (×3): 237 mL via ORAL

## 2020-10-13 MED ORDER — LOPERAMIDE HCL 1 MG/7.5ML PO SUSP
2.0000 mg | ORAL | Status: DC | PRN
  Filled 2020-10-13: qty 15

## 2020-10-13 NOTE — Progress Notes (Signed)
Physical Therapy Treatment Patient Details Name: Ralph [name] MRN: [id number] DOB: [birth year] Today's Date: 10/13/2020    History of Present Illness Pt is a 55 y.o. male admitted 09/01/20 for an Ivor-Lewis esophagectomy due to benign lower esophageal stricture and protein malnutrition.  Return to OR 8/3 for esophageal rupture with exploration/repair with J-tube placement. S/p trach placement 8/9. Trach decannulated 9/8. PMH includes stroke, CAD, MI, anxiety, multiple ballon dilations to esophageal stricture.   PT Comments    Pt not progressing with mobility, self-limiting with c/o diarrhea this session, reports, "I'm not doing anything if I'm shitting all over myself." Pt mod indep with bed mobility; receptive to therex education (HEP handout provided), but adamantly declines performing. Significant increased time educ on importance of mobility and discussing safe d/c plan. Pt states, "I'll go home" but later states he does want rehab; demonstrates poor awareness and insight into current functional status. Pt remains limited by generalized weakness, decreased activity tolerance, and impaired balance strategies/postural reactions. Will continue to follow acutely as pt agreeable to participate.    Follow Up Recommendations  SNF;Supervision for mobility/OOB     Equipment Recommendations  Rolling walker with 5" wheels;3in1 (PT)    Recommendations for Other Services       Precautions / Restrictions Precautions Precautions: Fall;Other (comment) Precaution Comments: J-tube; bowel incontinence Restrictions Weight Bearing Restrictions: No    Mobility  Bed Mobility Overal bed mobility: Modified Independent             General bed mobility comments: Mod indep for repositioning in bed; pt adamantly declining sitting at EOB, stating, "I'm not doing anything if I'm going to shit myself everytime I move"    Transfers                    Ambulation/Gait                  Stairs             Wheelchair Mobility    Modified Rankin (Stroke Patients Only)       Balance                                            Cognition Arousal/Alertness: Awake/alert Behavior During Therapy: Flat affect Overall Cognitive Status: No family/caregiver present to determine baseline cognitive functioning                                 General Comments: Pt with decreased awareness and insight into current deficits/medical status; difficult to reason with pt regarding importance of OOB mobility. Discussed d/c as pt states, "I'll go home... I'll have people there to help" when asked if pt seriously considering home, as CM/SW needs to know, pt states, "I want to get better... they're planning for rehab" -- pt not committing to an answer regarding where he wants to d/c, little ownership of his care and d/c plans      Exercises Other Exercises Other Exercises: [employer] HEP handout (Access Code [id number]) provided and educ on - SLR, glut bridge, sidelying hip ABD, heel slide, glut squeezes - pt receptive to educ on technique/frequency, but adamantly declining attempt to perform therex, states, "I'm not doing anything if I'm shitting all over myself; I'm not moving"    General Comments General comments (skin integrity,  edema, etc.): Increased time discussing importance of mobility, activity recommendations, discharge planning - pt fluctuates between saying home versus SNF, not committing to either; seems to have poor awareness of situation and safe d/c plan      Pertinent Vitals/Pain Pain Assessment: Faces Faces Pain Scale: Hurts a little bit Pain Location: Abdomen Pain Descriptors / Indicators: Discomfort Pain Intervention(s): Limited activity within patient's tolerance    Home Living                      Prior Function            PT Goals (current goals can now be found in the care plan section) Acute Rehab PT  Goals Patient Stated Goal: "I don't have any" PT Goal Formulation: With patient Potential to Achieve Goals: Fair Progress towards PT goals: Not progressing toward goals - comment (self-limiting)    Frequency    Min 2X/week      PT Plan Frequency needs to be updated    Co-evaluation              AM-PAC PT "6 Clicks" Mobility   Outcome Measure  Help needed turning from your back to your side while in a flat bed without using bedrails?: None Help needed moving from lying on your back to sitting on the side of a flat bed without using bedrails?: A Little Help needed moving to and from a bed to a chair (including a wheelchair)?: A Little Help needed standing up from a chair using your arms (e.g., wheelchair or bedside chair)?: A Little Help needed to walk in hospital room?: A Little Help needed climbing 3-5 steps with a railing? : A Lot 6 Click Score: 18    End of Session   Activity Tolerance: Other (comment) (self-limiting) Patient left: in bed;with call bell/phone within reach;with bed alarm set Nurse Communication: Mobility status PT Visit Diagnosis: Other abnormalities of gait and mobility (R26.89);Muscle weakness (generalized) (M62.81);Difficulty in walking, not elsewhere classified (R26.2)     Time: 6144-3154 PT Time Calculation (min) (ACUTE ONLY): 20 min  Charges:  $Self Care/Home Management: 8-22                     Ina Homes, PT, DPT Acute Rehabilitation Services  Pager [phone] Office [phone]  Malachy Chamber 10/13/2020, 5:07 PM

## 2020-10-13 NOTE — Progress Notes (Addendum)
      [address]       [place] [address]             [phone]      36 Days Post-Op Procedure(s) (LRB): XI ROBOTIC ASSISTED THORASCOPY-DECORTICATION & REVISION OF ESOPHAGECTOMY (Right) ESOPHAGOGASTRODUODENOSCOPY (EGD) (N/A) INTERCOSTAL NERVE BLOCK (Right) Subjective: Feels okay this morning, still having diarrhea that is limiting his mobility.   Objective: Vital signs in last 24 hours: Temp:  [97.8 F (36.6 C)-98.1 F (36.7 C)] 97.8 F (36.6 C) (09/08 0400) Pulse Rate:  [54-68] 54 (09/08 0405) Cardiac Rhythm: Normal sinus rhythm (09/08 0705) Resp:  [14-21] 14 (09/08 0405) BP: (100-121)/(63-80) 100/72 (09/08 0405) SpO2:  [93 %-96 %] 93 % (09/08 0405) FiO2 (%):  [21 %] 21 % (09/08 0405) Weight:  [54.5 kg] 54.5 kg (09/08 0445)     Intake/Output from previous day: 09/07 0701 - 09/08 0700 In: 1610 [P.O.:120; I.V.:30; NG/GT:1400] Out: 800 [Urine:800] Intake/Output this shift: No intake/output data recorded.  General appearance: alert, cooperative, and no distress Heart: sinus bradycardia Lungs: clear to auscultation bilaterally Abdomen: soft, non-tender; bowel sounds normal; no masses,  no organomegaly Extremities: extremities normal, atraumatic, no cyanosis or edema Wound: clean and dry  Lab Results: No results for input(s): WBC, HGB, HCT, PLT in the last 72 hours. BMET:  Recent Labs    10/11/20 0453  NA 134*  K 3.9  CL 99  CO2 29  GLUCOSE 107*  BUN 13  CREATININE 0.44*  CALCIUM 9.2    PT/INR: No results for input(s): LABPROT, INR in the last 72 hours. ABG    Component Value Date/Time   PHART 7.414 09/08/2020 0444   HCO3 20.7 09/08/2020 0444   TCO2 22 09/08/2020 0444   ACIDBASEDEF 3.0 (H) 09/08/2020 0444   O2SAT 97.0 09/08/2020 0444   CBG (last 3)  No results for input(s): GLUCAP in the last 72 hours.  Assessment/Plan: S/P Procedure(s) (LRB): XI ROBOTIC ASSISTED THORASCOPY-DECORTICATION & REVISION OF ESOPHAGECTOMY  (Right) ESOPHAGOGASTRODUODENOSCOPY (EGD) (N/A) INTERCOSTAL NERVE BLOCK (Right)  35 days post revision of esophagectomy after esophageal rupture following Ivor Lewis esophagectomy.  He later had a tracheostomy for persistent ventilator-dependent respiratory insufficiency.  Nutrition continues to be supported by way of the J-tube.  Critical care team is downsizing the trach and considering decannulation today.    -CV cardiac rhythm is stable.  NSR in the 60s. Blood pressure continues to require support with midodrine.Stable.   -Renal-function is stable   -Heme-expected acute blood loss anemia, hematocrit is stable.   -DVT prophylaxis-continue daily subcu enoxaparin   -Disposition- Patient is not a candidate for CIR.  SNF vs. LTAC search continues,  Assistance from the [facility] team is appreciated. He is not interested in [facility] because his father passed away there and [facility] not [facility]. Decannulation today, SLP eval, and then hopeful diet advancement.    LOS: 42 days    Sharlene Dory 10/13/2020  Cleared for dysphagia 2 diet. Plan for trach removal today. Dispo planning.  Tanea Moga Keane Scrape

## 2020-10-13 NOTE — Progress Notes (Addendum)
NAME:  Ralph [name], MRN:  [id number], DOB:  02-20-1965, LOS: 42 ADMISSION DATE:  09/01/2020, CONSULTATION DATE:  8/3 REFERRING MD:  Dr. Cliffton Asters, CHIEF COMPLAINT:  esophageal rupture, esophageal stricture    History of Present Illness:  Patient is a 55 yo M w/ PMH anxiety, CAD, MI, stroke presents to [facility] on 7/28 for esophagectomy w/ Dr. Cliffton Asters for esophageal stricture.  On 7/28, Dr. Cliffton Asters performed the esophagectomy without complication.  8/3 patient taken back to OR for esophageal rupture s/p esophagectomy by Dr. Cliffton Asters. Unable to extubate post procedure. Patient remained intubated on mechanical ventilation until tracheostomy was preformed 8/9.    See timeline below for full hospital events   Pertinent  Medical History  Anxiety  CAD MI Stroke  Significant Hospital Events:  7/28 admitted for planned esophagectomy. Chest tube was placed on right chest. J tube in place. 2 JP drains in place. Patient was extubated post procedure. Patient developed subcutaneous emphysema. Unable to use J tube due to leakage around site. 8/3: PCCM consulted for vent management after pt brought back to OR for esophageal rupture/ leak. Vanc/zosyn/fluconazole started. Hypotension noted post-op. Felt 2/2 Sepsis; treated w/ volume and pressors.  8/4: Still on norepinephrine.  Central venous pressure suggesting he is still hypovolemic so receiving fluid.  Spontaneous breathing trial initiated but not ready for extubation due to work of breathing.  culture sent from chest tube.  J-tube replaced in interventional radiology due to leaking.  Tube feeds resumed in the afternoon 8/5: J-tube site again leaking, tube feeds placed on hold around midnight.Still pressor dependent . Passed SBT. WOB improved.  Failed extubation and was reintubated 8/8: J-tube upsized 8/9: Tracheostomy 8/10 J-tube continues to leak. TF stopped. RT IJ CVC removed. 8/13 starting trickle feeds 8/15 bronch 8/16 pigtail to L fluid  collection 8/25 DG esophagus: Initial swallows with water-soluble contrast demonstrated no evidence of extravasation to suggest leak 8/27 transferred to PCU 8/29 able to phonate well w/ PMV.  9/6 capping trials started 9/8 decannulated  Interim History / Subjective:  He has tolerated capping trials without difficulty, I decannulated him this morning at bedside he tolerated this well Objective   Blood pressure 107/70, pulse (Abnormal) 55, temperature 97.9 F (36.6 C), temperature source Oral, resp. rate 16, height 5\' 8"  (1.727 m), weight 54.5 kg, SpO2 94 %.    FiO2 (%):  [21 %] 21 %   Intake/Output Summary (Last 24 hours) at 10/13/2020 1111 Last data filed at 10/13/2020 0800 Gross per 24 hour  Intake 1490 ml  Output 800 ml  Net 690 ml   Filed Weights   10/11/20 0500 10/12/20 0436 10/13/20 0445  Weight: 57.6 kg 58.6 kg 54.5 kg    Examination: General 55 year old chronically ill-appearing male he is lying in bed and in no acute distress his phonation quality is excellent HEENT normocephalic with the exception of some temporal wasting, tracheostomy site unremarkable, now has occlusive dressing phonation quality is actually quite good Pulmonary: Clear to auscultation no accessory use Cardiac: Regular rate and rhythm Abdomen soft not tender Neuro awake oriented appropriate Extremities: Warm dry poor muscle bulk     Resolved Hospital Problem list   Esophageal stricture  Constipation  Bradycardia, QTC 390, question vagal events-Cardiology Consulted >> stopped dobutamine 8/17 HAP (treated) _>grew pseudomonas Acute respiratory failure w/ hypoxia and hypercarbia requiring mechanical ventilation L loculated effusion, pneumonia with necrosis Grew prevotella, enterococcus, Pigtail removed 8/20  Assessment & Plan:   Trach dependence s/p prolonged critical illness 2/2  esophageal rupture s/p planned esophagectomy, complicated by HCAP and pleural space infection (see above).  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 He required tracheostomy for prolonged vent weaning. He has been doing well and his trach was able to be decannulated this morning. He denies complaints.  BP 107/70 (BP Location: Left Arm)   Pulse (!) 55   Temp 97.9 F (36.6 C) (Oral)   Resp 16   Ht 5\' 8"  (1.727 m)   Wt 54.5 kg   SpO2 94%   BMI 18.27 kg/m  Chronically ill appearing man lying in bed watching TV [place]/AT, eyes anicteric, temporal wasting Neck with bandage over trach site. CTA, no wheezing. Breathing comfortably on RA.  S1S2, RRR Abd soft, NT Skin warm, dry, no rashes.  Na+  134 Cr 0.44 WBC 11.4  Assessment & plan: Acute respiratory failure with hypoxia requiring trach for prolonged vent weaning due to NM weakness post-critical illness.  -routine post-trach care-- hold pressure over site when coughing, keep compressive dressing in place until closed. -pulmonary hygiene, con't mobility as  able  We will follow up tomorrow. Please call with questions.  , DO 10/13/20 12:44 PM [facility]

## 2020-10-13 NOTE — Evaluation (Signed)
Clinical/Bedside Swallow Evaluation Patient Details  Name: Ralph [name] MRN: [id number] Date of Birth: 09/05/1965  Today's Date: 10/13/2020 Time: SLP Start Time (ACUTE ONLY): 1210 SLP Stop Time (ACUTE ONLY): 1235 SLP Time Calculation (min) (ACUTE ONLY): 25 min  Past Medical History:  Past Medical History:  Diagnosis Date   Anxiety    Coronary artery disease    Myocardial infarction (HCC)    Stroke [facility])    Past Surgical History:  Past Surgical History:  Procedure Laterality Date   APPENDECTOMY     BALLOON DILATION N/A 06/01/2019   Procedure: BALLOON DILATION;  Surgeon: Vida Rigger, MD;  Location: [facility] ENDOSCOPY;  Service: Endoscopy;  Laterality: N/A;   BALLOON DILATION N/A 07/16/2019   Procedure: BALLOON DILATION;  Surgeon: Vida Rigger, MD;  Location: [facility];  Service: Endoscopy;  Laterality: N/A;   BALLOON DILATION N/A 09/09/2019   Procedure: BALLOON DILATION;  Surgeon: Vida Rigger, MD;  Location: [facility];  Service: Endoscopy;  Laterality: N/A;   BIOPSY  04/29/2019   Procedure: BIOPSY;  Surgeon: Charlott Rakes, MD;  Location: [facility] ENDOSCOPY;  Service: Endoscopy;;   BIOPSY  05/27/2019   Procedure: BIOPSY;  Surgeon: [name], MD;  Location: [facility];  Service: Endoscopy;;   BIOPSY  06/01/2019   Procedure: BIOPSY;  Surgeon: Vida Rigger, MD;  [facility]: [facility];  Service: Endoscopy;;   BIOPSY  07/16/2019   Procedure: BIOPSY;  Surgeon: Vida Rigger, MD;  [facility];  Service: Endoscopy;;   BIOPSY  05/27/2020   Procedure: BIOPSY;  Surgeon: Napoleon Form, MD;  Location: [facility] ENDOSCOPY;  Service: Endoscopy;;   ESOPHAGOGASTRODUODENOSCOPY N/A 06/01/2019   Procedure: ESOPHAGOGASTRODUODENOSCOPY (EGD);  Surgeon: Vida Rigger, MD;  Location: [facility] ENDOSCOPY;  Service: Endoscopy;  Laterality: N/A;   ESOPHAGOGASTRODUODENOSCOPY N/A 05/31/2020   Procedure: ESOPHAGOGASTRODUODENOSCOPY (EGD);  Surgeon: Corliss Skains, MD;  Location: [facility] OR;  Service: Thoracic;   Laterality: N/A;   ESOPHAGOGASTRODUODENOSCOPY N/A 09/01/2020   Procedure: ESOPHAGOGASTRODUODENOSCOPY (EGD);  Surgeon: Corliss Skains, MD;  Location: [facility] OR;  Service: Thoracic;  Laterality: N/A;   ESOPHAGOGASTRODUODENOSCOPY N/A 09/07/2020   Procedure: ESOPHAGOGASTRODUODENOSCOPY (EGD);  Surgeon: Corliss Skains, MD;  Location: [place] OR;  Service: Thoracic;  Laterality: N/A;   ESOPHAGOGASTRODUODENOSCOPY (EGD) WITH PROPOFOL N/A 04/29/2019   Procedure: ESOPHAGOGASTRODUODENOSCOPY (EGD) WITH PROPOFOL;  Surgeon: Charlott Rakes, MD;  Location: [facility] ENDOSCOPY;  Service: Endoscopy;  Laterality: N/A;   ESOPHAGOGASTRODUODENOSCOPY (EGD) WITH PROPOFOL N/A 05/27/2019   Procedure: ESOPHAGOGASTRODUODENOSCOPY (EGD) WITH PROPOFOL;  Surgeon: [name], MD;  [facility];  Service: Endoscopy;  Laterality: N/A;   ESOPHAGOGASTRODUODENOSCOPY (EGD) WITH PROPOFOL N/A 07/16/2019   Procedure: ESOPHAGOGASTRODUODENOSCOPY (EGD) WITH PROPOFOL;  Surgeon: Vida Rigger, MD;  [facility];  Service: Endoscopy;  Laterality: N/A;   ESOPHAGOGASTRODUODENOSCOPY (EGD) WITH PROPOFOL N/A 09/09/2019   Procedure: ESOPHAGOGASTRODUODENOSCOPY (EGD) WITH PROPOFOL;  Surgeon: Vida Rigger, MD;  [facility];  Service: Endoscopy;  Laterality: N/A;   ESOPHAGOGASTRODUODENOSCOPY (EGD) WITH PROPOFOL N/A 05/27/2020   Procedure: ESOPHAGOGASTRODUODENOSCOPY (EGD) WITH PROPOFOL;  Surgeon: Napoleon Form, MD;  Location: [facility] ENDOSCOPY;  Service: Endoscopy;  Laterality: N/A;   FINE NEEDLE ASPIRATION  09/09/2019   Procedure: FINE NEEDLE ASPIRATION (FNA) LINEAR;  Surgeon: Vida Rigger, MD;  Location: [facility]OS[facility];  Service: Endoscopy;;   FOREIGN BODY REMOVAL  07/16/2019   Procedure: FOREIGN BODY REMOVAL;  Surgeon: Vida Rigger, MD;  [facility]: [facility];  Service: Endoscopy;;   FOREIGN BODY REMOVAL  05/27/2020   Procedure: FOREIGN BODY REMOVAL;  Surgeon: Napoleon Form,  MD;  Location: [facility] ENDOSCOPY;  Service: Endoscopy;;    INTERCOSTAL NERVE BLOCK Right 09/01/2020   Procedure: INTERCOSTAL NERVE BLOCK;  Surgeon: Corliss Skains, MD;  Location: [facility] OR;  Service: Thoracic;  Laterality: Right;   INTERCOSTAL NERVE BLOCK Right 09/07/2020   Procedure: INTERCOSTAL NERVE BLOCK;  Surgeon: Corliss Skains, MD;  Location: [facility] OR;  Service: Thoracic;  Laterality: Right;   IR REPLC DUODEN/JEJUNO TUBE PERCUT W/FLUORO  09/08/2020   IR REPLC DUODEN/JEJUNO TUBE PERCUT W/FLUORO  09/12/2020   LAPAROSCOPIC JEJUNOSTOMY N/A 05/31/2020   Procedure: LAPAROSCOPIC JEJUNOSTOMY;  Surgeon: Corliss Skains, MD;  Location: [facility] OR;  Service: Thoracic;  Laterality: N/A;  EGD, C-arm required.   SHOULDER SURGERY     UPPER ESOPHAGEAL ENDOSCOPIC ULTRASOUND (EUS) N/A 09/09/2019   Procedure: UPPER ESOPHAGEAL ENDOSCOPIC ULTRASOUND (EUS);  Surgeon: [name], MD;  Location: [facility] ENDOSCOPY;  Service: Endoscopy;  Laterality: N/A;   HPI:  55 y/o male admitted 7/28 for an Ivor-Lewis esophagectomy due to benign lower esophageal stricture and protein malnutrition.   8/3 pt returned to OR for esophageal rupture with exploration and repair with Jtube replacement. He remained intubated 7/28-8/5; reintubated 8/5 until trach 8/9.  Had episodes of asymptomatic asystole on 8/17 and bradycardia. Cardiology believes these may be vagally mediated.  PMHx: anxiety, CAD, MI, Stroke, multiple balloon dilations to esophageal stricture.   Assessment / Plan / Recommendation Clinical Impression  Pt was decannulated this morning, and SLP received request to complete BSE to assess readiness to advance from clear liquid consistencies. Pt resting in bed, awake and alert. Flat affect. Pt has been tolerating clear liquids, and was willing to try advanced textures today. Pt accepted trials of puree and graham cracker, as well as boluses of thin liquid. Pt tolerated all consistencies without overt s/s aspiration, but reported the cracker was too difficulty to chew due to lack of  dentition. Given reduced strength and endurance, as well as lack of dentition, recommend beginning with dys 2 diet/thin liquids. Pt requests some of his medications be crushed in puree, and others given via PEG tube. Pt asked if he would continue to receive PEG feedings, and was told that as his PO intake increases, the need for PEG supplements would likely decrease. Will defer to MD and/or dietician for this process. RN and PA informed of results and recommendations.  SLP Visit Diagnosis: Dysphagia, unspecified (R13.10)    Aspiration Risk  Mild aspiration risk    Diet Recommendation Dysphagia 2 (Fine chop);Thin liquid   Liquid Administration via: Cup;Straw Medication Administration: Crushed with puree Supervision: Patient able to self feed;Intermittent supervision to cue for compensatory strategies Compensations: Small sips/bites;Slow rate Postural Changes: Seated upright at 90 degrees;Remain upright for at least 30 minutes after po intake    Other  Recommendations Oral Care Recommendations: Oral care BID   Follow up Recommendations LTACH      Frequency and Duration min 1 x/week  2 weeks;1 week       Prognosis Prognosis for Safe Diet Advancement: Good      Swallow Study   General Date of Onset: 09/01/20 HPI: 55 y/o male admitted 7/28 for an Ivor-Lewis esophagectomy due to benign lower esophageal stricture and protein malnutrition.   8/3 pt returned to OR for esophageal rupture with exploration and repair with Jtube replacement. He remained intubated 7/28-8/5; reintubated 8/5 until trach 8/9.  Had episodes of asymptomatic asystole on 8/17 and bradycardia. Cardiology believes these may be vagally mediated.  PMHx: anxiety, CAD, MI, Stroke,  multiple balloon dilations to esophageal stricture. Type of Study: Bedside Swallow Evaluation Previous Swallow Assessment: BSE 09/28/20 Diet Prior to this Study: Thin liquids;PEG tube (clear liquids) Temperature Spikes Noted: No Respiratory Status:  Room air History of Recent Intubation: Yes Length of Intubations (days): 12 days Date extubated: 09/13/20 Behavior/Cognition: Alert;Cooperative;Other (Comment) (flat affect) Oral Cavity Assessment: Within Functional Limits Oral Care Completed by SLP: No Oral Cavity - Dentition: Edentulous Vision: Functional for self-feeding Self-Feeding Abilities: Able to feed self Patient Positioning: Upright in bed Baseline Vocal Quality: Normal;Breathy (decannulated this morning) Volitional Cough: Strong Volitional Swallow: Able to elicit    Oral/Motor/Sensory Function Overall Oral Motor/Sensory Function: Within functional limits   Ice Chips Ice chips: Not tested   Thin Liquid Thin Liquid: Within functional limits Presentation: Self Fed;Straw    Nectar Thick Nectar Thick Liquid: Not tested   Honey Thick Honey Thick Liquid: Not tested   Puree Puree: Within functional limits Presentation: Spoon   Solid     Solid: Impaired Oral Phase Impairments: Impaired mastication Oral Phase Functional Implications: Impaired mastication;Prolonged oral transit Other Comments: difficulty chewing hard solid due to edentulousness     Lasya Vetter B. Murvin Natal, [facility], CCC-SLP Speech Language Pathologist Office: [phone]  Leigh Aurora 10/13/2020,12:51 PM

## 2020-10-14 ENCOUNTER — Other Ambulatory Visit (HOSPITAL_BASED_OUTPATIENT_CLINIC_OR_DEPARTMENT_OTHER): Payer: Self-pay

## 2020-10-14 LAB — BASIC METABOLIC PANEL
Anion gap: 11 (ref 5–15)
BUN: 15 mg/dL (ref 6–20)
CO2: 27 mmol/L (ref 22–32)
Calcium: 9.6 mg/dL (ref 8.9–10.3)
Chloride: 97 mmol/L — ABNORMAL LOW (ref 98–111)
Creatinine, Ser: 0.39 mg/dL — ABNORMAL LOW (ref 0.61–1.24)
GFR, Estimated: >60 mL/min (ref >60)
Glucose, Bld: 134 mg/dL — ABNORMAL HIGH (ref 70–99)
Potassium: 3.6 mmol/L (ref 3.5–5.1)
Sodium: 135 mmol/L (ref 135–145)

## 2020-10-14 LAB — CBC
HCT: 33.7 % — ABNORMAL LOW (ref 39.0–52.0)
Hemoglobin: 10.7 g/dL — ABNORMAL LOW (ref 13.0–17.0)
MCH: 29.2 pg (ref 26.0–34.0)
MCHC: 31.8 g/dL (ref 30.0–36.0)
MCV: 92.1 fL (ref 80.0–100.0)
Platelets: 234 10*3/uL (ref 150–400)
RBC: 3.66 MIL/uL — ABNORMAL LOW (ref 4.22–5.81)
RDW: 15.4 % (ref 11.5–15.5)
WBC: 9.1 10*3/uL (ref 4.0–10.5)
nRBC: 0 % (ref 0.0–0.2)

## 2020-10-14 NOTE — Progress Notes (Signed)
OT Cancellation Note  Patient Details Name: Ralph [name] MRN: [id number] DOB: [birth year]   Cancelled Treatment:    Reason Eval/Treat Not Completed: Patient declined, no reason specified. Attempted x 2 with pt refusing. Will attempt again next week and consider discharging due to repeated refusals as per department policy.   Evern Bio 10/14/2020, 11:23 AM Martie Round, OTR/L Acute Rehabilitation Services Pager: [phone] Office: [phone]

## 2020-10-14 NOTE — Progress Notes (Signed)
Nutrition Follow-up  DOCUMENTATION CODES:   Underweight, Severe malnutrition in context of chronic illness  INTERVENTION:   Continue 24-hour tube feeds via J-tube: - Jevity 1.5 @ 70 ml/hr (1680 ml/day) - ProSource TF 45 ml TID   Tube feeding regimen provides 2640 kcal, 140 grams of protein, and 1277 ml of H2O.  - Ensure Enlive po TID, each supplement provides 350 kcal and 20 grams of protein  - Encourage PO intake at meals  NUTRITION DIAGNOSIS:   Severe Malnutrition related to chronic illness (recurrent esophageal stricture s/p J-tube) as evidenced by severe muscle depletion, severe fat depletion.  Ongoing, being addressed via TF and diet advancement  GOAL:   Patient will meet greater than or equal to 90% of their needs  Met via TF  MONITOR:   PO intake, Diet advancement, Labs, Weight trends, TF tolerance, Skin, I & O's  REASON FOR ASSESSMENT:   Consult Enteral/tube feeding initiation and management  ASSESSMENT:   55 year old male who presented on 7/28 for esophagogastroduodenoscopy and robotic assisted esophagectomy. PMH of esophageal stricture s/p multiple dilations and J-tube placement in April 2022, stroke, CAD, MI, anxiety.  8/26 - clear liquids 9/06 - trach capped 9/08 - decannulated, diet advanced to dysphagia 2 with thin liquids  Per TCTS, plan is to continue 24-hour feeds as pt is not taking in enough POs. Possible transition to nocturnal feeds once pt tolerating at least 50% of meals.  Spoke with pt at bedside. He reports that the imodium capsule contents dissolved in water is helping his diarrhea more than the imodium suspension. He reports 1 episode of diarrhea this morning but this is improved compared to previous days.  Pt with lunch meal tray in front of him. RD encouraged pt to consume foods at meals and consume oral nutrition supplements. Pt states that he is being sent foods that he does not like. RD will make pt "with assist" at meals so that  someone from dining services will help with his meal orders. Pt states that the Ensure is "fine" but is too thick for him to want to drink right now. RD offered to adjust supplement regimen but pt declined and states that he will drink the Ensure when he can.  Noted breakfast meal tray in room. Pt had consumed <10% of meal. Pt reports that he reason he didn't eat more is because he had an episode of diarrhea.  Discussed with pt that he will continue with tube feeds in some capacity until he is able to demonstrate consistent adequate PO intake.  Current TF: Jevity 1.5 @ 70 ml/hr, ProSource TF 45 ml TID  First measured weight: 61 kg on 8/03 Current weight: 54.5 kg  Medications reviewed and include: melatonin, IV protonix,   Labs reviewed.  UOP: 1050 ml x 24 hours  Diet Order:   Diet Order             DIET DYS 2 Room service appropriate? Yes; Fluid consistency: Thin  Diet effective now                   EDUCATION NEEDS:   No education needs have been identified at this time  Skin:  Skin Assessment:  Skin Integrity Issues: Stage II: sacrum Incisions: abdomen, R chest x 2  Last BM:  10/13/20 large type 7  Height:   Ht Readings from Last 1 Encounters:  10/06/20 _0  (1.727 m)    Weight:   Wt Readings from Last 1 Encounters:  10/14/20  54.5 kg    BMI:  Body mass index is 18.27 kg/m.  Estimated Nutritional Needs:   Kcal:  5844-1712 kcals  Protein:  115-135 grams  Fluid:  >/= 2.0 L    Gustavus Bryant, MS, RD, LDN Inpatient Clinical Dietitian Please see AMiON for contact information.

## 2020-10-14 NOTE — Progress Notes (Signed)
  Speech Language Pathology Treatment: Dysphagia  Patient Details Name: Ralph [name] MRN: [id number] DOB: October 01, 1965 Today's Date: 10/14/2020 Time: 7517-0017 SLP Time Calculation (min) (ACUTE ONLY): 12 min  Assessment / Plan / Recommendation Clinical Impression  Pt with lunch tray at bedside, ate minimally.  Agreeable to eating a bit more after encouragement. He demonstrated excellent and consistent toleration of thin liquids.  Consumption of dys2 solids was with some prolonged mastication but was functional. He appears to be protecting airway well.  Voice remains hypophonic after decannulation with some air leakage from stoma.  Recommend continuing current diet- anticipate steady progression of solids from Dys 2  n the next several days.    HPI HPI: 55 y/o male admitted 7/28 for an Ivor-Lewis esophagectomy due to benign lower esophageal stricture and protein malnutrition.   8/3 pt returned to OR for esophageal rupture with exploration and repair with Jtube replacement. He remained intubated 7/28-8/5; reintubated 8/5 until trach 8/9.  Had episodes of asymptomatic asystole on 8/17 and bradycardia. Cardiology believes these may be vagally mediated. Decannulated 9/8 and started on dysphagia 2/thin liquids same date.  PMHx: anxiety, CAD, MI, Stroke, multiple balloon dilations to esophageal stricture.      SLP Plan  Continue with current plan of care       Recommendations  Diet recommendations: Dysphagia 2 (fine chop);Thin liquid Liquids provided via: Cup;Straw;Teaspoon Medication Administration: Crushed with puree Supervision: Patient able to self feed Compensations: Small sips/bites;Slow rate Postural Changes and/or Swallow Maneuvers: Seated upright 90 degrees                Oral Care Recommendations: Oral care BID SLP Visit Diagnosis: Dysphagia, unspecified (R13.10) Plan: Continue with current plan of care       GO               Ralph Bruck L. Samson Frederic, MA CCC/SLP Acute  Rehabilitation Services Office number [phone] Pager [phone]  Ralph [name] 10/14/2020, 2:32 PM

## 2020-10-14 NOTE — Progress Notes (Signed)
Sleeping comfortably. Breathing comfortably on RA.  Per RN, no issues with trach.   PCCM will sign off. Please call with questions.  Steffanie Dunn, DO 10/14/20 3:59 PM [facility]

## 2020-10-14 NOTE — Progress Notes (Addendum)
      [address]       [place] [address]             [phone]      37 Days Post-Op Procedure(s) (LRB): XI ROBOTIC ASSISTED THORASCOPY-DECORTICATION & REVISION OF ESOPHAGECTOMY (Right) ESOPHAGOGASTRODUODENOSCOPY (EGD) (N/A) INTERCOSTAL NERVE BLOCK (Right) Subjective: States the oral imodium is working better for him (capsule contents dissolved in water)  Objective: Vital signs in last 24 hours: Temp:  [97.8 F (36.6 C)-98.2 F (36.8 C)] 98 F (36.7 C) (09/09 0454) Pulse Rate:  [55-69] 60 (09/09 0500) Cardiac Rhythm: Sinus bradycardia;Bundle branch block (09/09 0704) Resp:  [11-21] 13 (09/09 0500) BP: (107-127)/(70-89) 121/73 (09/09 0500) SpO2:  [92 %-95 %] 92 % (09/09 0500) FiO2 (%):  [21 %-94 %] 94 % (09/08 2036) Weight:  [54.5 kg] 54.5 kg (09/09 0457)     Intake/Output from previous day: 09/08 0701 - 09/09 0700 In: 2270 [P.O.:180; I.V.:50; NG/GT:1890] Out: 1050 [Urine:1050] Intake/Output this shift: No intake/output data recorded.  General appearance: alert, cooperative, and no distress Heart: regular rate and rhythm, S1, S2 normal, no murmur, click, rub or gallop Lungs: clear to auscultation bilaterally Abdomen: soft, non-tender; bowel sounds normal; no masses,  no organomegaly Extremities: extremities normal, atraumatic, no cyanosis or edema Wound: clean and dry  Lab Results: Recent Labs    10/14/20 0505  WBC 9.1  HGB 10.7*  HCT 33.7*  PLT 234   BMET:  Recent Labs    10/14/20 0505  NA 135  K 3.6  CL 97*  CO2 27  GLUCOSE 134*  BUN 15  CREATININE 0.39*  CALCIUM 9.6    PT/INR: No results for input(s): LABPROT, INR in the last 72 hours. ABG    Component Value Date/Time   PHART 7.414 09/08/2020 0444   HCO3 20.7 09/08/2020 0444   TCO2 22 09/08/2020 0444   ACIDBASEDEF 3.0 (H) 09/08/2020 0444   O2SAT 97.0 09/08/2020 0444   CBG (last 3)  No results for input(s): GLUCAP in the last 72 hours.  Assessment/Plan: S/P  Procedure(s) (LRB): XI ROBOTIC ASSISTED THORASCOPY-DECORTICATION & REVISION OF ESOPHAGECTOMY (Right) ESOPHAGOGASTRODUODENOSCOPY (EGD) (N/A) INTERCOSTAL NERVE BLOCK (Right)  35 days post revision of esophagectomy after esophageal rupture following Ivor Lewis esophagectomy.  He later had a tracheostomy for persistent ventilator-dependent respiratory insufficiency.  Nutrition continues to be supported by way of the J-tube.    -continue 24 hour tube feeds until he is tolerating at least 50% of his dysphagia 2 diet. He did have a "few bites" last night without any issue.   -Diarrhea-slowed down with imodium orally (capsule contents mixed with water)   -CV cardiac rhythm is stable.  NSR in the 60s. Blood pressure continues to require support with midodrine.Stable.   -Renal-function is stable   -Heme-expected acute blood loss anemia, hematocrit is stable.   -DVT prophylaxis-continue daily subcu enoxaparin   -Disposition- Patient is ready for discharge from our perspective. He had a bed at [facility] but did not want to go there for personal reasons. We will continue to look for SNF placement. Appreciate case management's assistance. Decannulated, so far tolerated Dysphagia 2 diet, continue 24 hour TF for now. Encouraged ambulation around the unit   LOS: 43 days    Ralph [name] 10/14/2020  Decannulated yesterday. Diet advanced, but not taking enough nutrition. Continue tube feeds Dispo planning.  Ralph [name]

## 2020-10-14 NOTE — TOC Progression Note (Addendum)
Transition of Care [facility]) - Progression Note    Patient Details  Name: Ralph [name] MRN: [id number] Date of Birth: [birth year]  Transition of Care [facility]) CM/SW Contact  Leone Haven, RN Phone Number: 10/14/2020, 4:22 PM  Clinical Narrative:    Per Physician advisor Dr. Waymon Amato, patient's insurance will not  cover him beginning today.  CSW spoke with patient and he has declined going to a SNF and he has declined going home with [facility] services.  NCM informed Denny Peon PA of this information also, she will speak with Drl Lightfoot regarding this information.   Expected Discharge Plan:  (Vent/SNF) Barriers to Discharge: Continued Medical Work up  Expected Discharge Plan and Services Expected Discharge Plan:  (Vent/SNF) In-house Referral: Clinical Social Work     Living arrangements for the past 2 months: Single Family Home                                       Social Determinants of Health (SDOH) Interventions    Readmission Risk Interventions No flowsheet data found.

## 2020-10-14 NOTE — TOC Progression Note (Signed)
Transition of Care [facility]) - Progression Note    Patient Details  Name: Ralph [name] MRN: [id number] Date of Birth: 1965-07-29  Transition of Care [facility]) CM/SW Contact  Ivette Loyal, [place] Phone Number: 10/14/2020, 4:11 PM  Clinical Narrative:    CSW never received a call back from [facility] a the [facility] vent SNF location. CSW reached out to [place] who could take the medicaid but they do not take trach.  CSW spoke with the pt about going to [facility] in which the pt refused stating he thinks he can go home and does not need any HH services either. CSW let pt know insurance would no longer pay for his hospital stay and pt stated that was fine, he can go home. CSW followed up with NCM. CSW signing off.   Expected Discharge Plan:  (Vent/SNF) Barriers to Discharge: Continued Medical Work up  Expected Discharge Plan and Services Expected Discharge Plan:  (Vent/SNF) In-house Referral: Clinical Social Work     Living arrangements for the past 2 months: Single Family Home                                       Social Determinants of Health (SDOH) Interventions    Readmission Risk Interventions No flowsheet data found.

## 2020-10-15 NOTE — NC FL2 (Signed)
[place] LEVEL OF CARE SCREENING TOOL     IDENTIFICATION  Patient Name: Ralph [name] Birthdate: 1965-12-13 Sex: male Admission Date (Current Location): 09/01/2020  [facility] and [place] Number:  Herbalist and Address:  The [place]. [facility], [place] [address] [place], [place] [address]      Provider Number: [id number]  Attending Physician Name and Address:  Lajuana Matte, MD  Relative Name and Phone Number:  Burnadette Pop [phone]    Current Level of Care: Hospital Recommended Level of Care: [place] Prior Approval Number:    Date Approved/Denied:   PASRR Number: [id number]  Discharge Plan: SNF    Current Diagnoses: Patient Active Problem List   Diagnosis Date Noted   Status post tracheostomy ([place])    Heart block AV complete ([place])    Pressure injury of skin 09/20/2020   Jejunostomy tube leak ([place])    Respiratory failure ([place])    Endotracheally intubated    Esophagectomy, anastomotic leak    Esophageal stricture 09/01/2020   Abdominal pain 06/30/2020   Cellulitis 06/23/2020   Protein-calorie malnutrition, severe 05/25/2020   Epididymitis 04/06/2020   Right inguinal hernia 09/14/2019   Depression, recurrent ([place]) 06/11/2019   GERD with esophagitis 05/27/2019   Bradycardia 05/04/2019   Chest tube in place 05/04/2019   Does not have health insurance    Dysphagia    Hyponatremia    Tobacco use disorder    Neuropathic pain 08/29/2017   High risk social situation 08/29/2017   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   Pure hypercholesterolemia 11/26/2012   Cerebrovascular disease 11/25/2012   Peripheral vascular disease, unspecified ([place]) 11/25/2012   Coronary artery disease 11/25/2012   Tobacco abuse counseling 11/25/2012   Essential hypertension, benign 11/25/2012    Orientation RESPIRATION BLADDER Height & Weight     Self, Time, Situation, Place  Normal Continent Weight: 119 lb  11.4 oz (54.3 kg) Height:  _0  (172.7 cm)  BEHAVIORAL SYMPTOMS/MOOD NEUROLOGICAL BOWEL NUTRITION STATUS      Incontinent Diet (Please see discharge summary, has J-tube)  AMBULATORY STATUS COMMUNICATION OF NEEDS Skin   Limited Assist Verbally Other (Comment) (Catheter entry/exit site, PI sacrum posterior, mid deep tissue, Echhymosis left upper arm)                       Personal Care Assistance Level of Assistance  Bathing, Feeding, Dressing Bathing Assistance: Limited assistance Feeding assistance: Limited assistance Dressing Assistance: Limited assistance     Functional Limitations Info  Sight, Hearing, Speech Sight Info: Adequate Hearing Info: Adequate Speech Info: Adequate    SPECIAL CARE FACTORS FREQUENCY  PT (By licensed PT), OT (By licensed OT)     PT Frequency: 5x min weekly OT Frequency: 5x min weekly            Contractures Contractures Info: Not present    Additional Factors Info  Code Status Allergies  Code Status Info: FULL Allergies Info: No Known Allergies     Current Medications (10/15/2020):  This is the current hospital active medication list Current Facility-Administered Medications  Medication Dose Route Frequency Provider Last Rate Last Admin   0.9 %  sodium chloride infusion   Intravenous PRN Lajuana Matte, MD   Stopped at 10/01/20 1802   acetaminophen (TYLENOL) 160 MG/5ML solution 650 mg  650 mg Per Tube Q4H PRN Kipp Brood, MD   650 mg at 10/10/20 0620   albuterol (PROVENTIL) (2.5  MG/3ML) 0.083% nebulizer solution 2.5 mg  2.5 mg Nebulization Q4H PRN Candee Furbish, MD   2.5 mg at 09/29/20 2018   atropine 1 MG/10ML injection 0.5 mg  0.5 mg Intravenous PRN Buford Dresser, MD   1 mg at 09/26/20 0341   chlorhexidine gluconate (MEDLINE KIT) (PERIDEX) 0.12 % solution 15 mL  15 mL Mouth Rinse BID Lajuana Matte, MD   15 mL at 10/13/20 0733   Chlorhexidine Gluconate Cloth 2 % PADS 6 each  6 each Topical Daily Jadene Pierini  E, PA-C   6 each at 10/15/20 0931   enoxaparin (LOVENOX) injection 40 mg  40 mg Subcutaneous QHS Candee Furbish, MD   40 mg at 10/13/20 2252   escitalopram (LEXAPRO) tablet 10 mg  10 mg Per Tube QHS Candee Furbish, MD   10 mg at 10/14/20 2124   feeding supplement (ENSURE ENLIVE / ENSURE PLUS) liquid 237 mL  237 mL Oral TID BM Lightfoot, Lucile Crater, MD   237 mL at 10/13/20 1649   feeding supplement (JEVITY 1.5 CAL/FIBER) liquid 1,000 mL  1,000 mL Per Tube Continuous Lajuana Matte, MD 70 mL/hr at 10/14/20 2136 1,000 mL at 10/14/20 2136   feeding supplement (PROSource TF) liquid 45 mL  45 mL Per Tube TID Lajuana Matte, MD   45 mL at 10/15/20 5956   Gerhardt's butt cream   Topical QID Lajuana Matte, MD   Given at 10/15/20 0932   HYDROcodone-acetaminophen (HYCET) 7.5-325 mg/15 ml solution 15 mL  15 mL Per Tube Q4H PRN Lajuana Matte, MD   15 mL at 10/15/20 0931   influenza vac split quadrivalent PF (FLUARIX) injection 0.5 mL  0.5 mL Intramuscular Tomorrow-1000 Lightfoot, Harrell O, MD       lidocaine (LIDODERM) 5 % 1 patch  1 patch Transdermal Q24H Gaye Pollack, MD   1 patch at 10/05/20 2022   loperamide (IMODIUM) capsule 2 mg  2 mg Oral PRN Nicholes Rough N, PA-C   2 mg at 10/15/20 3875   MEDLINE mouth rinse  15 mL Mouth Rinse q12n4p Lightfoot, Lucile Crater, MD   15 mL at 10/14/20 1256   melatonin tablet 10 mg  10 mg Per Tube QHS Candee Furbish, MD   10 mg at 10/14/20 2124   midodrine (PROAMATINE) tablet 10 mg  10 mg Per J Tube TID Mitzi Hansen, MD   10 mg at 10/15/20 0928   ondansetron (ZOFRAN) tablet 4 mg  4 mg Per Tube Q6H PRN Kipp Brood, MD   4 mg at 10/01/20 0640   oxyCODONE (ROXICODONE) 5 MG/5ML solution 5 mg  5 mg Per Tube QHS PRN Harriet Pho, Tessa N, PA-C   5 mg at 10/14/20 2130   pantoprazole (PROTONIX) injection 40 mg  40 mg Intravenous Q24H Erick Colace, NP   40 mg at 10/15/20 0932   polyethylene glycol (MIRALAX / GLYCOLAX) packet 17 g  17 g Per Tube Daily  PRN Agarwala, Einar Grad, MD       sodium chloride flush (NS) 0.9 % injection 10-40 mL  10-40 mL Intracatheter Q12H Lightfoot, Harrell O, MD   10 mL at 10/15/20 0945   sodium chloride flush (NS) 0.9 % injection 10-40 mL  10-40 mL Intracatheter PRN Lajuana Matte, MD         Discharge Medications: Please see discharge summary for a list of discharge medications.  Relevant Imaging Results:  Relevant Lab Results:   Additional Information [id number]  Bary Castilla, LCSW

## 2020-10-15 NOTE — NC FL2 (Signed)
[place] LEVEL OF CARE SCREENING TOOL     IDENTIFICATION  Patient Name: Ralph [name] Birthdate: 01-16-1966 Sex: male Admission Date (Current Location): 09/01/2020  [facility] and [place] Number:  Herbalist and Address:  The [place]. [facility], [place] [address] [place], [place] [address]      Provider Number: [id number]  Attending Physician Name and Address:  Lajuana Matte, MD  Relative Name and Phone Number:  Burnadette Pop [phone]    Current Level of Care: Hospital Recommended Level of Care: [place] Prior Approval Number:    Date Approved/Denied:   PASRR Number: [id number]  Discharge Plan: Other (Comment) (trach/snf)    Current Diagnoses: Patient Active Problem List   Diagnosis Date Noted   Status post tracheostomy ([place])    Heart block AV complete ([place])    Pressure injury of skin 09/20/2020   Jejunostomy tube leak ([place])    Respiratory failure ([place])    Endotracheally intubated    Esophagectomy, anastomotic leak    Esophageal stricture 09/01/2020   Abdominal pain 06/30/2020   Cellulitis 06/23/2020   Protein-calorie malnutrition, severe 05/25/2020   Epididymitis 04/06/2020   Right inguinal hernia 09/14/2019   Depression, recurrent ([place]) 06/11/2019   GERD with esophagitis 05/27/2019   Bradycardia 05/04/2019   Chest tube in place 05/04/2019   Does not have health insurance    Dysphagia    Hyponatremia    Tobacco use disorder    Neuropathic pain 08/29/2017   High risk social situation 08/29/2017   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   Pure hypercholesterolemia 11/26/2012   Cerebrovascular disease 11/25/2012   Peripheral vascular disease, unspecified ([place]) 11/25/2012   Coronary artery disease 11/25/2012   Tobacco abuse counseling 11/25/2012   Essential hypertension, benign 11/25/2012    Orientation RESPIRATION BLADDER Height & Weight     Self, Time, Situation, Place  Normal  Continent Weight: 119 lb 11.4 oz (54.3 kg) Height:  5' 8"  (172.7 cm)  BEHAVIORAL SYMPTOMS/MOOD NEUROLOGICAL BOWEL NUTRITION STATUS      Incontinent Diet (Please see discharge summary, has J-tube)  AMBULATORY STATUS COMMUNICATION OF NEEDS Skin   Limited Assist Verbally Other (Comment) (Catheter entry/exit site, PI sacrum posterior, mid deep tissue, Echhymosis left upper arm)                       Personal Care Assistance Level of Assistance  Bathing, Feeding, Dressing Bathing Assistance: Limited assistance Feeding assistance: Limited assistance Dressing Assistance: Limited assistance     Functional Limitations Info  Sight, Hearing, Speech Sight Info: Adequate Hearing Info: Adequate Speech Info: Adequate    SPECIAL CARE FACTORS FREQUENCY  PT (By licensed PT), OT (By licensed OT)     PT Frequency: 5x min weekly OT Frequency: 5x min weekly            Contractures Contractures Info: Not present    Additional Factors Info  Code Status, Allergies, Psychotropic, Insulin Sliding Scale Code Status Info: FULL Allergies Info: No Known Allergies Psychotropic Info: clonazepam (klonopin) tablet 76m 2 times daily, Quetiapine (seroquel) tablet 524m2 times daily Insulin Sliding Scale Info: insulin aspart (novoLOG) injection 0-24 units every 4 hours       Current Medications (10/15/2020):  This is the current hospital active medication list Current Facility-Administered Medications  Medication Dose Route Frequency Provider Last Rate Last Admin   0.9 %  sodium chloride infusion   Intravenous PRN LiLajuana MatteMD  Stopped at 10/01/20 1802   acetaminophen (TYLENOL) 160 MG/5ML solution 650 mg  650 mg Per Tube Q4H PRN Kipp Brood, MD   650 mg at 10/10/20 0620   albuterol (PROVENTIL) (2.5 MG/3ML) 0.083% nebulizer solution 2.5 mg  2.5 mg Nebulization Q4H PRN Candee Furbish, MD   2.5 mg at 09/29/20 2018   atropine 1 MG/10ML injection 0.5 mg  0.5 mg Intravenous PRN Buford Dresser, MD   1 mg at 09/26/20 0341   chlorhexidine gluconate (MEDLINE KIT) (PERIDEX) 0.12 % solution 15 mL  15 mL Mouth Rinse BID Lajuana Matte, MD   15 mL at 10/13/20 0733   Chlorhexidine Gluconate Cloth 2 % PADS 6 each  6 each Topical Daily Gold, Patrick Jupiter E, PA-C   6 each at 10/15/20 0931   enoxaparin (LOVENOX) injection 40 mg  40 mg Subcutaneous QHS Candee Furbish, MD   40 mg at 10/13/20 2252   escitalopram (LEXAPRO) tablet 10 mg  10 mg Per Tube QHS Candee Furbish, MD   10 mg at 10/14/20 2124   feeding supplement (ENSURE ENLIVE / ENSURE PLUS) liquid 237 mL  237 mL Oral TID BM Lightfoot, Lucile Crater, MD   237 mL at 10/13/20 1649   feeding supplement (JEVITY 1.5 CAL/FIBER) liquid 1,000 mL  1,000 mL Per Tube Continuous Lajuana Matte, MD 70 mL/hr at 10/14/20 2136 1,000 mL at 10/14/20 2136   feeding supplement (PROSource TF) liquid 45 mL  45 mL Per Tube TID Lajuana Matte, MD   45 mL at 10/15/20 6834   Gerhardt's butt cream   Topical QID Lajuana Matte, MD   Given at 10/15/20 0932   HYDROcodone-acetaminophen (HYCET) 7.5-325 mg/15 ml solution 15 mL  15 mL Per Tube Q4H PRN Lajuana Matte, MD   15 mL at 10/15/20 0931   influenza vac split quadrivalent PF (FLUARIX) injection 0.5 mL  0.5 mL Intramuscular Tomorrow-1000 Lightfoot, Harrell O, MD       lidocaine (LIDODERM) 5 % 1 patch  1 patch Transdermal Q24H Gaye Pollack, MD   1 patch at 10/05/20 2022   loperamide (IMODIUM) capsule 2 mg  2 mg Oral PRN Nicholes Rough N, PA-C   2 mg at 10/15/20 1962   MEDLINE mouth rinse  15 mL Mouth Rinse q12n4p Lightfoot, Lucile Crater, MD   15 mL at 10/14/20 1256   melatonin tablet 10 mg  10 mg Per Tube QHS Candee Furbish, MD   10 mg at 10/14/20 2124   midodrine (PROAMATINE) tablet 10 mg  10 mg Per J Tube TID Mitzi Hansen, MD   10 mg at 10/15/20 0928   ondansetron (ZOFRAN) tablet 4 mg  4 mg Per Tube Q6H PRN Kipp Brood, MD   4 mg at 10/01/20 0640   oxyCODONE (ROXICODONE) 5 MG/5ML  solution 5 mg  5 mg Per Tube QHS PRN Harriet Pho, Tessa N, PA-C   5 mg at 10/14/20 2130   pantoprazole (PROTONIX) injection 40 mg  40 mg Intravenous Q24H Erick Colace, NP   40 mg at 10/15/20 0932   polyethylene glycol (MIRALAX / GLYCOLAX) packet 17 g  17 g Per Tube Daily PRN Agarwala, Einar Grad, MD       sodium chloride flush (NS) 0.9 % injection 10-40 mL  10-40 mL Intracatheter Q12H Lightfoot, Harrell O, MD   10 mL at 10/15/20 0945   sodium chloride flush (NS) 0.9 % injection 10-40 mL  10-40 mL Intracatheter PRN Melodie Bouillon  O, MD         Discharge Medications: Please see discharge summary for a list of discharge medications.  Relevant Imaging Results:  Relevant Lab Results:   Additional Information [id number]  Bary Castilla, LCSW

## 2020-10-15 NOTE — TOC Progression Note (Signed)
Transition of Care [facility]) - Progression Note    Patient Details  Name: Ralph [name] MRN: [id number] Date of Birth: 07/29/1965  [facility]) CM/SW [place], [place] Phone Number: [phone] 10/15/2020, 10:16 AM  Clinical Narrative:     CSW was alerted that pt now wants SNF placement. CSW met with pt's RN Palma Holter in order to receive most updated FL2 information.Pt is now decannulated.  CSW confirmed with pt that he is agreeable to SNF and received permission to re-faxed out updated Fl2.  CSW updated fl2 and sent out to local facilities.  TOC team will continue to assist with discharge planning needs.    Expected Discharge Plan:  (Vent/SNF) Barriers to Discharge: Continued Medical Work up  Expected Discharge Plan and Services Expected Discharge Plan:  (Vent/SNF) In-house Referral: Clinical Social Work     Living arrangements for the past 2 months: Single Family Home                                       Social Determinants of Health (SDOH) Interventions    Readmission Risk Interventions No flowsheet data found.

## 2020-10-15 NOTE — Progress Notes (Addendum)
[address]       ,[place] [address]             [phone]      38 Days Post-Op Procedure(s) (LRB): XI ROBOTIC ASSISTED THORASCOPY-DECORTICATION & REVISION OF ESOPHAGECTOMY (Right) ESOPHAGOGASTRODUODENOSCOPY (EGD) (N/A) INTERCOSTAL NERVE BLOCK (Right)  Subjective:  Patient complaining of pain.  Diarrhea is improved.  Spoke with patient in regards to SNF placement.  He states that he isn't able to make that decision and asked me to call his wife.  Stressed importance of working with PT/OT to patient as this is a necessity to get stronger and better.  Objective: Vital signs in last 24 hours: Temp:  [97.3 F (36.3 C)-98.4 F (36.9 C)] 97.7 F (36.5 C) (09/10 0806) Pulse Rate:  [61-66] 66 (09/10 0806) Cardiac Rhythm: Sinus bradycardia;Bundle branch block (09/10 0731) Resp:  [17-18] 18 (09/10 0806) BP: (103-130)/(61-87) 109/61 (09/10 0806) SpO2:  [94 %-96 %] 95 % (09/10 0806) Weight:  [54.3 kg] 54.3 kg (09/10 0555)  Intake/Output from previous day: 09/09 0701 - 09/10 0700 In: 10 [I.V.:10] Out: 525 [Urine:525]  General appearance: alert, cooperative, and appears malnourished Heart: regular rate and rhythm Lungs: clear to auscultation bilaterally Abdomen: soft, non tender, mild bilious drainage from j tube site,   Lab Results: Recent Labs    10/14/20 0505  WBC 9.1  HGB 10.7*  HCT 33.7*  PLT 234   BMET:  Recent Labs    10/14/20 0505  NA 135  K 3.6  CL 97*  CO2 27  GLUCOSE 134*  BUN 15  CREATININE 0.39*  CALCIUM 9.6    PT/INR: No results for input(s): LABPROT, INR in the last 72 hours. ABG    Component Value Date/Time   PHART 7.414 09/08/2020 0444   HCO3 20.7 09/08/2020 0444   TCO2 22 09/08/2020 0444   ACIDBASEDEF 3.0 (H) 09/08/2020 0444   O2SAT 97.0 09/08/2020 0444   CBG (last 3)  No results for input(s): GLUCAP in the last 72 hours.  Assessment/Plan: S/P Procedure(s) (LRB): XI ROBOTIC ASSISTED THORASCOPY-DECORTICATION &  REVISION OF ESOPHAGECTOMY (Right) ESOPHAGOGASTRODUODENOSCOPY (EGD) (N/A) INTERCOSTAL NERVE BLOCK (Right)  35 days post revision of esophagectomy after esophageal rupture following Ivor Lewis esophagectomy Pulm- patient has been decannulated tolerating without difficulty Malnutrition- patient has been evaluated by SLP is on a dysphagia 2 diet, tolerating, but overall not eating much, tube feeds are still running continuously.. May benefit from reduction in tube feeds throughout the day to encourage oral intake, which will also help decrease diarrhea CV- NSR, BP stable on Midodrine Deconditioning- severe... patient is no state to be discharged home.  He has severe weakness and deconditioning, he requires continued to tube feeds and advance SLP modalities to monitor diet advancement slowly.  Per patient's request I have spoken with his significant other who is in 100% agreement the patient needs SNF at discharge and she is agreeable to this.  Patient stated he cant make decision and defers to his wife.  I explained to the patient that if he wants to go home he really needs to go to SNF facility to get strength and endurance up as it is currently unsafe for him to be discharged in his current state to home.  I have spoken with TOC who will again arrange SNF placement.   Hopefully this can be achieved to get patient to rehab as this is ultimately the most beneficial to the patient.  I also instructed patient  that participation with PT/OT is a must and please do not refuse if at all possible.   LOS: 44 days   [name], PA-C 10/15/2020   Chart reviewed, patient examined, agree with above. I agree that he is too deconditioned to go home and needs SNF placement.

## 2020-10-15 NOTE — TOC Progression Note (Addendum)
Transition of Care [facility]) - Progression Note    Patient Details  Name: Ralph [name] MRN: [id number] Date of Birth: 07/08/1965  [facility]) CM/SW Contact  [name], RN Phone Number: 10/15/2020, 9:44 AM  Clinical Narrative:    Notified by [name] PA, that patient is not safe to DC to home. Patient stating that he can't go home, and that patient and wife are agreeable to SNF.  Patient decannulated on 9/8, continues with tube feeds.  CM spoke with Misty Stanley, CSW to initiate SNF for placement.     Expected Discharge Plan:  (Vent/SNF) Barriers to Discharge: Continued Medical Work up  Expected Discharge Plan and Services Expected Discharge Plan:  (Vent/SNF) In-house Referral: Clinical Social Work     Living arrangements for the past 2 months: Single Family Home                                       Social Determinants of Health (SDOH) Interventions    Readmission Risk Interventions No flowsheet data found.

## 2020-10-16 MED ORDER — JEVITY 1.5 CAL/FIBER PO LIQD
1000.0000 mL | ORAL | Status: DC
  Administered 2020-10-16: 1000 mL
  Filled 2020-10-16: qty 1000

## 2020-10-16 MED ORDER — PANTOPRAZOLE SODIUM 40 MG PO PACK
40.0000 mg | PACK | Freq: Every day | ORAL | Status: DC
  Administered 2020-10-16 – 2020-10-20 (×5): 40 mg
  Filled 2020-10-16 (×6): qty 20

## 2020-10-16 NOTE — Progress Notes (Addendum)
      [address]       [place] [address]             [phone]      39 Days Post-Op Procedure(s) (LRB): XI ROBOTIC ASSISTED THORASCOPY-DECORTICATION & REVISION OF ESOPHAGECTOMY (Right) ESOPHAGOGASTRODUODENOSCOPY (EGD) (N/A) INTERCOSTAL NERVE BLOCK (Right)  Subjective:  No new complaints, continues to have pain.  Feels full at times, tolerating oral intake  Objective: Vital signs in last 24 hours: Temp:  [97.2 F (36.2 C)-97.9 F (36.6 C)] 97.4 F (36.3 C) (09/11 0830) Pulse Rate:  [53-69] 69 (09/11 0830) Cardiac Rhythm: Sinus bradycardia (09/11 0700) Resp:  [14-23] 23 (09/11 0830) BP: (105-117)/(65-80) 115/65 (09/11 0830) SpO2:  [92 %-95 %] 94 % (09/11 0830)  Intake/Output from previous day: 09/10 0701 - 09/11 0700 In: 200 [P.O.:200] Out: 800 [Urine:800] Intake/Output this shift: Total I/O In: -  Out: 250 [Urine:250]  General appearance: alert, cooperative, and no distress Heart: regular rate and rhythm Lungs: clear to auscultation bilaterally Abdomen: soft, non-tender; bowel sounds normal; no masses,  no organomegaly  Lab Results: Recent Labs    10/14/20 0505  WBC 9.1  HGB 10.7*  HCT 33.7*  PLT 234   BMET:  Recent Labs    10/14/20 0505  NA 135  K 3.6  CL 97*  CO2 27  GLUCOSE 134*  BUN 15  CREATININE 0.39*  CALCIUM 9.6    PT/INR: No results for input(s): LABPROT, INR in the last 72 hours. ABG    Component Value Date/Time   PHART 7.414 09/08/2020 0444   HCO3 20.7 09/08/2020 0444   TCO2 22 09/08/2020 0444   ACIDBASEDEF 3.0 (H) 09/08/2020 0444   O2SAT 97.0 09/08/2020 0444   CBG (last 3)  No results for input(s): GLUCAP in the last 72 hours.  Assessment/Plan: S/P Procedure(s) (LRB): XI ROBOTIC ASSISTED THORASCOPY-DECORTICATION & REVISION OF ESOPHAGECTOMY (Right) ESOPHAGOGASTRODUODENOSCOPY (EGD) (N/A) INTERCOSTAL NERVE BLOCK (Right)  37 days post revision of esophagectomy after esophageal rupture following Ivor Lewis  esophagectomy Pulm- patient has been decannulated tolerating without difficulty Malnutrition- patient has been evaluated by SLP is on a dysphagia 2 diet, tolerating, but overall not eating much, tube feeds are still running continuously... patient admittedly feels full throughout the day.  I think its reasonable to put patient on a calorie count and reduce tube feeds to run nightly to see if patient's oral intake will improve CV- remains hemodynamically stable in NSR and on Midodrine Deconditioning- severe, will require SNF, significant other is in agreement, planning in process   LOS: 45 days    [name] 10/16/2020   Chart reviewed, patient examined, agree with above. I agree that night time feeding trial is reasonable to try to increase po intake.

## 2020-10-17 MED ORDER — LOPERAMIDE HCL 1 MG/7.5ML PO SUSP
2.0000 mg | ORAL | Status: DC | PRN
  Administered 2020-10-17 – 2020-10-23 (×13): 2 mg via ORAL
  Filled 2020-10-17 (×18): qty 15

## 2020-10-17 MED ORDER — JEVITY 1.5 CAL/FIBER PO LIQD
1000.0000 mL | ORAL | Status: DC
  Administered 2020-10-17 – 2020-10-20 (×4): 1000 mL
  Filled 2020-10-17 (×5): qty 1000

## 2020-10-17 MED ORDER — LOPERAMIDE HCL 2 MG PO CAPS
2.0000 mg | ORAL_CAPSULE | ORAL | Status: DC | PRN
  Administered 2020-10-21: 2 mg via ORAL
  Filled 2020-10-17 (×4): qty 1

## 2020-10-17 MED ORDER — BOOST PLUS PO LIQD
237.0000 mL | Freq: Three times a day (TID) | ORAL | Status: DC
  Administered 2020-10-17 – 2020-10-23 (×10): 237 mL via ORAL
  Filled 2020-10-17 (×27): qty 237

## 2020-10-17 NOTE — Progress Notes (Signed)
Physical Therapy Treatment Patient Details Name: Ralph [name] MRN: [id number] DOB: 1965/08/08 Today's Date: 10/17/2020   History of Present Illness Pt is a 55 y.o. male admitted 09/01/20 for an Ivor-Lewis esophagectomy due to benign lower esophageal stricture and protein malnutrition.  Return to OR 8/3 for esophageal rupture with exploration/repair with J-tube placement. S/p trach placement 8/9. Trach decannulated 9/8. PMH includes stroke, CAD, MI, anxiety, multiple ballon dilations to esophageal stricture.    PT Comments    Pt agreeable to participation with PT and session focused on gait. Continue to recommend SNF for further rehab.    Recommendations for follow up therapy are one component of a multi-disciplinary discharge planning process, led by the attending physician.  Recommendations may be updated based on patient status, additional functional criteria and insurance authorization.  Follow Up Recommendations  SNF     Equipment Recommendations  Other (comment);3in1 (PT) (rollator)    Recommendations for Other Services       Precautions / Restrictions Precautions Precautions: Fall;Other (comment) Precaution Comments: J-tube; bowel incontinence     Mobility  Bed Mobility Overal bed mobility: Modified Independent Bed Mobility: Supine to Sit;Sit to Supine   Sidelying to sit: Modified independent (Device/Increase time);HOB elevated Supine to sit: Modified independent (Device/Increase time);HOB elevated          Transfers Overall transfer level: Needs assistance Equipment used: 4-wheeled walker Transfers: Sit to/from Stand Sit to Stand: Min assist         General transfer comment: Assist for stability to rise  Ambulation/Gait Ambulation/Gait assistance: Min guard Gait Distance (Feet): 75 Feet Assistive device: 4-wheeled walker Gait Pattern/deviations: Step-through pattern;Decreased step length - left;Decreased step length - right;Decreased stride length;Trunk  flexed Gait velocity: decreased Gait velocity interpretation: 1.31 - 2.62 ft/sec, indicative of limited community ambulator General Gait Details: Assist for balance and safety. Use of rollator improved stability and gait speed   Stairs             Wheelchair Mobility    Modified Rankin (Stroke Patients Only)       Balance Overall balance assessment: Needs assistance Sitting-balance support: No upper extremity supported Sitting balance-Leahy Scale: Good     Standing balance support: Bilateral upper extremity supported Standing balance-Leahy Scale: Poor Standing balance comment: UE support for static standing                            Cognition Arousal/Alertness: Awake/alert Behavior During Therapy: Flat affect Overall Cognitive Status: No family/caregiver present to determine baseline cognitive functioning                                 General Comments: Decr logical reasoning skills with regards to steps needed to get home      Exercises      General Comments        Pertinent Vitals/Pain Pain Assessment: Faces Faces Pain Scale: No hurt    Home Living                      Prior Function            PT Goals (current goals can now be found in the care plan section) Progress towards PT goals: Progressing toward goals    Frequency    Min 2X/week      PT Plan Current plan remains appropriate    Co-evaluation  AM-PAC PT "6 Clicks" Mobility   Outcome Measure    Help needed moving from lying on your back to sitting on the side of a flat bed without using bedrails?: None Help needed moving to and from a bed to a chair (including a wheelchair)?: A Little Help needed standing up from a chair using your arms (e.g., wheelchair or bedside chair)?: A Little Help needed to walk in hospital room?: A Little Help needed climbing 3-5 steps with a railing? : A Lot 6 Click Score: 15    End of Session    Activity Tolerance: Patient tolerated treatment well Patient left: in bed;with call bell/phone within reach   PT Visit Diagnosis: Other abnormalities of gait and mobility (R26.89);Muscle weakness (generalized) (M62.81);Difficulty in walking, not elsewhere classified (R26.2)     Time: 5697-9480 PT Time Calculation (min) (ACUTE ONLY): 13 min  Charges:  $Gait Training: 8-22 mins                     [facility] PT Acute Rehabilitation Services Pager [phone] Office [phone]    Angelina Ok [facility] 10/17/2020, 2:57 PM

## 2020-10-17 NOTE — Plan of Care (Signed)
Discussed with patient plan of care for the evening, pain management and feeding tube starting tonight with some teach back displayed.  Problem: Education: Goal: Knowledge of the prescribed therapeutic regimen will improve Outcome: Progressing

## 2020-10-17 NOTE — Progress Notes (Addendum)
      [address]       [place] [address]             [phone]      40 Days Post-Op Procedure(s) (LRB): XI ROBOTIC ASSISTED THORASCOPY-DECORTICATION & REVISION OF ESOPHAGECTOMY (Right) ESOPHAGOGASTRODUODENOSCOPY (EGD) (N/A) INTERCOSTAL NERVE BLOCK (Right) Subjective: Diarrhea has slowed down. He states he ate too much for dinner last night.  Objective: Vital signs in last 24 hours: Temp:  [97.4 F (36.3 C)-98.4 F (36.9 C)] 98.4 F (36.9 C) (09/12 0325) Pulse Rate:  [57-69] 57 (09/12 0325) Cardiac Rhythm: Sinus bradycardia (09/11 1908) Resp:  [14-27] 14 (09/12 0325) BP: (97-123)/(65-88) 97/71 (09/12 0325) SpO2:  [94 %-95 %] 94 % (09/12 0325) Weight:  [57 kg] 57 kg (09/12 0325)    Intake/Output from previous day: 09/11 0701 - 09/12 0700 In: 10 [I.V.:10] Out: 550 [Urine:550] Intake/Output this shift: No intake/output data recorded.  General appearance: alert, cooperative, and no distress Heart: regular rate and rhythm, S1, S2 normal, no murmur, click, rub or gallop Lungs: clear to auscultation bilaterally Abdomen: soft, non-tender; bowel sounds normal; no masses,  no organomegaly Extremities: extremities normal, atraumatic, no cyanosis or edema Wound: clean and dry  Lab Results: No results for input(s): WBC, HGB, HCT, PLT in the last 72 hours. BMET: No results for input(s): NA, K, CL, CO2, GLUCOSE, BUN, CREATININE, CALCIUM in the last 72 hours.  PT/INR: No results for input(s): LABPROT, INR in the last 72 hours. ABG    Component Value Date/Time   PHART 7.414 09/08/2020 0444   HCO3 20.7 09/08/2020 0444   TCO2 22 09/08/2020 0444   ACIDBASEDEF 3.0 (H) 09/08/2020 0444   O2SAT 97.0 09/08/2020 0444   CBG (last 3)  No results for input(s): GLUCAP in the last 72 hours.  Assessment/Plan: S/P Procedure(s) (LRB): XI ROBOTIC ASSISTED THORASCOPY-DECORTICATION & REVISION OF ESOPHAGECTOMY (Right) ESOPHAGOGASTRODUODENOSCOPY (EGD) (N/A) INTERCOSTAL  NERVE BLOCK (Right)  38 days post revision of esophagectomy after esophageal rupture following Ivor Lewis esophagectomy Pulm- patient has been decannulated tolerating without difficulty Malnutrition- patient has been evaluated by SLP is on a dysphagia 2 diet, tolerating, but overall not eating much, tube feeds at night for calories..he states he ate too much last night and didn't feel well all night. It will be a slow process for him to be able to intake enough calories to d/c the tube feeds all together.  CV- remains hemodynamically stable in NSR and on Midodrine Deconditioning- severe, will require SNF, significant other is in agreement, planning in process  Plan: Re-submitting Mr. Lashway for SNF placement now that he is decannulated and only needing night feeds. Hopeful for placement soon.    LOS: 46 days    Sharlene Dory 10/17/2020  Doing well Cycling tube feeds Tolerating diet Ok for dispo to snf Continue PT, and dispo planning  Herby Amick O [place]

## 2020-10-17 NOTE — Progress Notes (Signed)
Nutrition Follow-up  DOCUMENTATION CODES:   Underweight, Severe malnutrition in context of chronic illness  INTERVENTION:   - 48-hour calorie count to start today at breakfast meal, RD to follow up with day 1 results tomorrow, 10/18/20  Continue nocturnal tube feeds via J-tube: - Jevity 1.5 @ 100 ml/hr x 10 hours from 1900 to 0500 (total of 1000 ml) - ProSource TF 45 ml TID  Nocturnal tube feeding regimen provides 1620 kcal, 97 grams of protein, and 760 ml of H2O (meets 70% of kcal needs and 84% of protein needs).   - Boost Plus po TID between meals, each supplement provides 360 kcal and 14 grams of protein  - d/c Ensure Enlive   - Encourage PO intake at meals  NUTRITION DIAGNOSIS:   Severe Malnutrition related to chronic illness (recurrent esophageal stricture s/p J-tube) as evidenced by severe muscle depletion, severe fat depletion.  Ongoing, being addressed via TF and oral nutrition supplements  GOAL:   Patient will meet greater than or equal to 90% of their needs  Progressing  MONITOR:   PO intake, Diet advancement, Labs, Weight trends, TF tolerance, Skin, I & O's  REASON FOR ASSESSMENT:   Consult Enteral/tube feeding initiation and management  ASSESSMENT:   55 year old male who presented on 7/28 for esophagogastroduodenoscopy and robotic assisted esophagectomy. PMH of esophageal stricture s/p multiple dilations and J-tube placement in April 2022, stroke, CAD, MI, anxiety.  8/26 - clear liquids 9/06 - trach capped 9/08 - decannulated, diet advanced to dysphagia 2 with thin liquids 9/11 - transitioned to nocturnal tube feeds  Spoke with pt at bedside. Pt states that he is doing okay today. Noted untouched breakfast meal tray at bedside. Pt states that he "hasn't gotten up yet." RD offered to set up pt's meal tray for him but pt declined, stating that he is not hungry right now. RD explained transition to nocturnal tube feeds in attempt to stimulate appetite and  promote PO intake during the day. Pt in agreement with plan.  Pt states that he does not like Ensure Enlive supplements. Will order Boost Plus instead. RN is aware of calorie count and will start it today with breakfast meal. RD hung calorie count envelope on pt's door.  First measured weight: 61 kg on 8/03 Current weight: 57 kg  No meal completions documented since 9/10.  Medications reviewed and include: Ensure Enlive TID, melatonin, protonix  Labs reviewed.  Diet Order:   Diet Order             DIET DYS 2 Room service appropriate? Yes with Assist; Fluid consistency: Thin  Diet effective now                   EDUCATION NEEDS:   No education needs have been identified at this time  Skin:  Skin Assessment: Skin Integrity Issues: Stage II: sacrum Incisions: abdomen, R chest x 2  Last BM:  10/16/20  Height:   Ht Readings from Last 1 Encounters:  10/06/20 5\' 8"  (1.727 m)    Weight:   Wt Readings from Last 1 Encounters:  10/17/20 57 kg    BMI:  Body mass index is 19.11 kg/m.  Estimated Nutritional Needs:   Kcal:  2300-2600 kcals  Protein:  115-135 grams  Fluid:  >/= 2.0 L    12/17/20, MS, RD, LDN Inpatient Clinical Dietitian Please see AMiON for contact information.

## 2020-10-17 NOTE — TOC Progression Note (Signed)
Transition of Care [facility]) - Progression Note    Patient Details  Name: TRAYDEN BRANDY MRN: [id number] Date of Birth: [birth year]  Transition of Care [facility]) CM/SW Contact  Ivette Loyal, [place] Phone Number: 10/17/2020, 4:03 PM  Clinical Narrative:    CSW following pt for SNF, pt has been re-faxed and awaiting bed offers again. CSW will follow up with pt with bed offers once they are available.   Expected DC plans: SNF Barriers to Discharge: Continued Medical Work up  Expected Discharge Plan and Services Expected Discharge Plan:  (Vent/SNF) In-house Referral: Clinical Social Work     Living arrangements for the past 2 months: Single Family Home                                       Social Determinants of Health (SDOH) Interventions    Readmission Risk Interventions No flowsheet data found.

## 2020-10-18 NOTE — TOC Progression Note (Addendum)
Transition of Care [facility]) - Progression Note    Patient Details  Name: QUENTION MCNEILL MRN: [id number] Date of Birth: 1965-06-22  Transition of Care [facility]) CM/SW Contact  Ivette Loyal, [place] Phone Number: 10/18/2020, 9:27 AM  Clinical Narrative:    CSW spoke with Doree Fudge at [facility] in [facility] who is currently reviewing pt information for inpatient rehab offer. CSW will continue to follow.  CSW contacted Shanda Bumps at [facility], she informed CSW that pt has to have a solid dc plan back home with assistance they will be able to take pt. CSW contacted pt spouse who stated that she works 7 days a week and will not be a lot of help but pt could go home after the 10 days at rehab. CSW shared this information with Shanda Bumps who suggested that she come meet with CSW at [facility] tomorrow after meeting with PT to get their input on pt DC capabilities. Shanda Bumps has expressed a huge concern for the pt not being able to DC in 10 days. CSW will continue to follow for DC needs.   Expected Discharge Plan:  (Vent/SNF) Barriers to Discharge: Continued Medical Work up  Expected Discharge Plan and Services Expected Discharge Plan:  (Vent/SNF) In-house Referral: Clinical Social Work     Living arrangements for the past 2 months: Single Family Home                                       Social Determinants of Health (SDOH) Interventions    Readmission Risk Interventions No flowsheet data found.

## 2020-10-18 NOTE — Consult Note (Signed)
   [facility] Inpatient Consult   10/18/2020  Ralph [name] [birth year] [id number]  Managed Medicaid:  [employer]  Referral from inpatient [facility] RNCM request to see if patient is in [employer] [facility] [employer]. Patient is being managed by another MM team.  [place] reviewed and checked [employer] and found patient is listed to be managed with [employer] for Managed Medicaid [MM].  Will sign off and update inpatient TOC RNCM of findings.  For questions,  Charlesetta Shanks, RN BSN CCM [facility]  [phone] business mobile phone Toll free office [phone]  Fax number: [phone] [email or web address]

## 2020-10-18 NOTE — Progress Notes (Signed)
Occupational Therapy Treatment Patient Details Name: Ralph [name] MRN: [id number] DOB: 12-15-1965 Today's Date: 10/18/2020   History of present illness Pt is a 55 y.o. male admitted 09/01/20 for an Ivor-Lewis esophagectomy due to benign lower esophageal stricture and protein malnutrition.  Return to OR 8/3 for esophageal rupture with exploration/repair with J-tube placement. S/p trach placement 8/9. Trach decannulated 9/8. PMH includes stroke, CAD, MI, anxiety, multiple ballon dilations to esophageal stricture.   OT comments  Pt with slower progress towards OT goals due to inconsistent participation. However, with continued coordination for premedication, pt agreeable for OOB activities. Pt able to demo mobility to/from bathroom using RW with min guard at most - no LOB and fair maneuvering of RW. Instructed pt in UE HEP for UB strengthening (handout provided for improved carryover) with pt able to return demo with good carryover via lower repetitions. Encouraged pt to complete exercises again later this evening. Plan to address ADLs at sink (bathing, dressing) in next session as pt declined to address these areas today.  VSS on RA.    Recommendations for follow up therapy are one component of a multi-disciplinary discharge planning process, led by the attending physician.  Recommendations may be updated based on patient status, additional functional criteria and insurance authorization.    Follow Up Recommendations  SNF    Equipment Recommendations  Other (comment) (Rollator)    Recommendations for Other Services      Precautions / Restrictions Precautions Precautions: Fall;Other (comment) Precaution Comments: J-tube; bowel incontinence Restrictions Weight Bearing Restrictions: No       Mobility Bed Mobility Overal bed mobility: Modified Independent Bed Mobility: Supine to Sit;Sit to Supine     Supine to sit: Modified independent (Device/Increase time);HOB elevated Sit to supine:  Modified independent (Device/Increase time)   General bed mobility comments: No assist needed, light use of bed rails and increased time with HOB elevated    Transfers Overall transfer level: Needs assistance Equipment used: Rolling walker (2 wheeled) Transfers: Sit to/from Stand Sit to Stand: Supervision         General transfer comment: no assist needed to stand from bedside using RW    Balance Overall balance assessment: Needs assistance Sitting-balance support: No upper extremity supported Sitting balance-Leahy Scale: Good     Standing balance support: Bilateral upper extremity supported Standing balance-Leahy Scale: Poor Standing balance comment: UE support for static standing                           ADL either performed or assessed with clinical judgement   ADL Overall ADL's : Needs assistance/impaired                         Toilet Transfer: Min IT sales professional Details (indicate cue type and reason): slow, steady pace and no LOB. pt declined to actually sit on toilet to assess comfortable height for transfers but walked in/out of bathroom and manuevered RW well         Functional mobility during ADLs: Min guard;Rolling walker General ADL Comments: Session focused on mobility to/from bathroom to improve toileting independence/dignity in future. Educated pt in UE HEP using theraband as well to maximize strength. Pt declined ADLs at sink today     Vision   Vision Assessment?: No apparent visual deficits   Perception     Praxis      Cognition Arousal/Alertness: Awake/alert Behavior During Therapy: Flat affect Overall Cognitive  Status: No family/caregiver present to determine baseline cognitive functioning                                 General Comments: Decr logical reasoning skills with regards to steps needed to get home        Exercises Exercises: General Upper Extremity General Exercises -  Upper Extremity Shoulder Flexion: Strengthening;Both;5 reps;Seated;Theraband Theraband Level (Shoulder Flexion): Level 1 (Yellow) Shoulder Horizontal ABduction: Strengthening;Both;5 reps;Seated;Theraband Theraband Level (Shoulder Horizontal Abduction): Level 1 (Yellow) Elbow Flexion: Strengthening;Both;5 reps;Seated;Theraband Theraband Level (Elbow Flexion): Level 1 (Yellow) Elbow Extension: Strengthening;Both;5 reps;Seated;Theraband Theraband Level (Elbow Extension): Level 1 (Yellow)   Shoulder Instructions       General Comments VSS on RA    Pertinent Vitals/ Pain       Pain Assessment: Faces Faces Pain Scale: No hurt Pain Intervention(s): Monitored during session;Premedicated before session  Home Living                                          Prior Functioning/Environment              Frequency  Min 2X/week        Progress Toward Goals  OT Goals(current goals can now be found in the care plan section)  Progress towards OT goals: Progressing toward goals  Acute Rehab OT Goals Patient Stated Goal: "I don't have any" OT Goal Formulation: With patient Time For Goal Achievement: 11/01/20 Potential to Achieve Goals: Good ADL Goals Pt Will Perform Grooming: with modified independence;standing Pt Will Perform Lower Body Bathing: with modified independence;sitting/lateral leans;sit to/from stand Pt Will Transfer to Toilet: with modified independence;ambulating Pt/caregiver will Perform Home Exercise Program: Increased strength;Both right and left upper extremity;With theraband;Independently;With written HEP provided  Plan Discharge plan remains appropriate    Co-evaluation                 AM-PAC OT "6 Clicks" Daily Activity     Outcome Measure   Help from another person eating meals?: None Help from another person taking care of personal grooming?: A Little Help from another person toileting, which includes using toliet, bedpan, or  urinal?: A Lot Help from another person bathing (including washing, rinsing, drying)?: A Lot Help from another person to put on and taking off regular upper body clothing?: A Little Help from another person to put on and taking off regular lower body clothing?: A Lot 6 Click Score: 16    End of Session Equipment Utilized During Treatment: Rolling walker  OT Visit Diagnosis: Unsteadiness on feet (R26.81);Other abnormalities of gait and mobility (R26.89);Muscle weakness (generalized) (M62.81);Pain   Activity Tolerance Patient tolerated treatment well   Patient Left in bed;with call bell/phone within reach;with bed alarm set   Nurse Communication Mobility status        Time: 6387-5643 OT Time Calculation (min): 18 min  Charges: OT General Charges $OT Visit: 1 Visit OT Treatments $Self Care/Home Management : 8-22 mins  Bradd Canary, OTR/L Acute Rehab Services Office: [phone]   Lorre Munroe 10/18/2020, 2:11 PM

## 2020-10-18 NOTE — Progress Notes (Signed)
Calorie Count Note: Day 1  48-hour calorie count ordered. Calorie count started at breakfast meal on 9/12. Please see day 1 results below.  Diet: dysphagia 2 with thin liquids Supplements: - Boost Plus TID  10/17/20: Breakfast: 0 kcal, 0 grams of protein Lunch: no documentation available Dinner: 149 kcal, 8 grams of protein Supplements: 360 kcal, 14 grams of protein  Day 1 total 24-hour PO intake: 509 kcal (22% of minimum estimated needs)  22 grams of protein (19% of minimum estimated needs)  Day 1 total 24-hour intake including PO and TF: 2129 kcal (93% of minimum estimated needs) 119 grams of protein (100% of minimum estimated needs)  Nutrition Diagnosis: Severe malnutrition related to chronic illness (recurrent esophageal stricture s/p J-tube) as evidenced by severe muscle depletion, severe fat depletion.  Goal: Patient will meet greater than or equal to 90% of their needs.  Intervention: - Continue 48-hour calorie count for 1 more day - Continue nocturnal tube feeds via J-tube of Jevity 1.5 @ 100 ml/hr x 10 hours from 1900 to 0500 and ProSource TF 45 ml TID - Continue Boost Plus TID - Add Magic Cups TID with meals.   Mertie Clause, MS, RD, LDN Inpatient Clinical Dietitian Please see AMiON for contact information.

## 2020-10-18 NOTE — Progress Notes (Signed)
  Speech Language Pathology Treatment: Dysphagia  Patient Details Name: Ralph [name] MRN: [id number] DOB: 08-20-1965 Today's Date: 10/18/2020 Time: 1340-1350 SLP Time Calculation (min) (ACUTE ONLY): 10 min  Assessment / Plan / Recommendation Clinical Impression  Followed up for diet tolerance. Per RN, PO intake remains minimal with supplemental nocturnal feeds . Pt also states appetite remains poor. Assessed with upgraded soft solid snack and thin liquids at bedside. Pt primarily edentulous, he notes he was unable to masticate solid POs at baseline due to lack of teeth. Pt with prolonged mastication of soft solids but adequate oral clearance. No overt s/sx of aspiration with any PO. Will advance diet to dysphagia 3 (mechanical soft) and thin liquids with hopes for improved PO intake. Pt and RN in agreement with plan. SLP to continue to monitor.    HPI HPI: 55 y/o male admitted 7/28 for an Ivor-Lewis esophagectomy due to benign lower esophageal stricture and protein malnutrition.   8/3 pt returned to OR for esophageal rupture with exploration and repair with Jtube replacement. He remained intubated 7/28-8/5; reintubated 8/5 until trach 8/9.  Had episodes of asymptomatic asystole on 8/17 and bradycardia. Cardiology believes these may be vagally mediated. Decannulated 9/8 and started on dysphagia 2/thin liquids same date.  PMHx: anxiety, CAD, MI, Stroke, multiple balloon dilations to esophageal stricture.      SLP Plan  Continue with current plan of care       Recommendations  Diet recommendations: Dysphagia 3 (mechanical soft);Thin liquid Liquids provided via: Cup;Straw;Teaspoon Medication Administration: Crushed with puree Supervision: Patient able to self feed Compensations: Small sips/bites;Slow rate Postural Changes and/or Swallow Maneuvers: Seated upright 90 degrees;Upright 30-60 min after meal                Oral Care Recommendations: Oral care BID Follow up Recommendations:  Skilled Nursing facility SLP Visit Diagnosis: Dysphagia, unspecified (R13.10) Plan: Continue with current plan of care       GO                Ardyth Gal MA, CCC-SLP Acute Rehabilitation Services   10/18/2020, 1:56 PM

## 2020-10-18 NOTE — Progress Notes (Addendum)
      [address]       [place] [address]             [phone]      41 Days Post-Op Procedure(s) (LRB): XI ROBOTIC ASSISTED THORASCOPY-DECORTICATION & REVISION OF ESOPHAGECTOMY (Right) ESOPHAGOGASTRODUODENOSCOPY (EGD) (N/A) INTERCOSTAL NERVE BLOCK (Right) Subjective: Feels okay this morning, no complaints  Objective: Vital signs in last 24 hours: Temp:  [97.3 F (36.3 C)-98 F (36.7 C)] 97.6 F (36.4 C) (09/13 0738) Pulse Rate:  [57-68] 65 (09/13 0738) Cardiac Rhythm: Sinus bradycardia (09/13 0702) Resp:  [13-26] 26 (09/13 0738) BP: (110-130)/(77-85) 116/77 (09/13 0738) SpO2:  [93 %-96 %] 95 % (09/13 0738) Weight:  [53.7 kg] 53.7 kg (09/13 0500)     Intake/Output from previous day: 09/12 0701 - 09/13 0700 In: 1748 [P.O.:557; I.V.:16; NG/GT:900] Out: 676 [Urine:675; Stool:1] Intake/Output this shift: No intake/output data recorded.  General appearance: alert, cooperative, and no distress Heart: regular rate and rhythm, S1, S2 normal, no murmur, click, rub or gallop Lungs: clear to auscultation bilaterally Abdomen: soft, non-tender; bowel sounds normal; no masses,  no organomegaly Extremities: extremities normal, atraumatic, no cyanosis or edema Wound: clean and dry  Lab Results: No results for input(s): WBC, HGB, HCT, PLT in the last 72 hours. BMET: No results for input(s): NA, K, CL, CO2, GLUCOSE, BUN, CREATININE, CALCIUM in the last 72 hours.  PT/INR: No results for input(s): LABPROT, INR in the last 72 hours. ABG    Component Value Date/Time   PHART 7.414 09/08/2020 0444   HCO3 20.7 09/08/2020 0444   TCO2 22 09/08/2020 0444   ACIDBASEDEF 3.0 (H) 09/08/2020 0444   O2SAT 97.0 09/08/2020 0444   CBG (last 3)  No results for input(s): GLUCAP in the last 72 hours.  Assessment/Plan: S/P Procedure(s) (LRB): XI ROBOTIC ASSISTED THORASCOPY-DECORTICATION & REVISION OF ESOPHAGECTOMY (Right) ESOPHAGOGASTRODUODENOSCOPY (EGD) (N/A) INTERCOSTAL  NERVE BLOCK (Right)  39 days post revision of esophagectomy after esophageal rupture following Ivor Lewis esophagectomy Pulm- patient has been decannulated tolerating without difficulty Malnutrition- patient has been evaluated by SLP is on a dysphagia 2 diet, tolerating, increasing calories as able, tube feeds at night for calories..It will be a slow process for him to be able to intake enough calories to d/c the tube feeds all together.  CV- remains hemodynamically stable in NSR and on Midodrine Deconditioning- severe, will require SNF  Plan: Re-submitting Mr. Rueckert for SNF placement now that he is decannulated and only needing night feeds. The patient is in agreement.  Hopeful for placement soon.    LOS: 47 days    Sharlene Dory 10/18/2020  Continues to improve. Tolerating nocturnal feeds. Dispo planning.  Yasira Engelson Keane Scrape

## 2020-10-19 NOTE — Progress Notes (Signed)
Calorie Count Note: Day 2  48-hour calorie count ordered. Calorie count started at breakfast meal on 9/12. Please see day 2 results below.  Diet: dysphagia 3 with thin liquids Supplements: - Boost Plus TID - Magic Cup TID  10/18/20: Breakfast: 0 kcal, 0 grams of protein Lunch: 56 kcal, 3 grams of protein Dinner: 43 kcal, 5 grams of protein Supplements: 360 kcal, 14 grams of protein  Day 2 total 24-hour PO intake: 459 kcal (20% of minimum estimated needs)  22 grams of protein (19% of minimum estimated needs)  Day 2 total 24-hour intake including PO and TF: 2079 kcal (90% of minimum estimated needs) 119 grams of protein (100% of minimum estimated needs)  Nutrition Diagnosis: Severe malnutrition related to chronic illness (recurrent esophageal stricture s/p J-tube) as evidenced by severe muscle depletion, severe fat depletion.  Goal: Patient will meet greater than or equal to 90% of their needs.  Intervention: - d/c 48-hour calorie count - Continue nocturnal tube feeds via J-tube of Jevity 1.5 @ 100 ml/hr x 10 hours from 1900 to 0500 and ProSource TF 45 ml TID - Continue Boost Plus TID - Continue Magic Cups TID with meals.   Mertie Clause, MS, RD, LDN Inpatient Clinical Dietitian Please see AMiON for contact information.

## 2020-10-19 NOTE — Progress Notes (Addendum)
      [address]       [place] [address]             [phone]      42 Days Post-Op Procedure(s) (LRB): XI ROBOTIC ASSISTED THORASCOPY-DECORTICATION & REVISION OF ESOPHAGECTOMY (Right) ESOPHAGOGASTRODUODENOSCOPY (EGD) (N/A) INTERCOSTAL NERVE BLOCK (Right) Subjective: Rested well, tolerating soft diet but says he feels full after just a few bites. Had about PO intake past 24 hours.   Objective: Vital signs in last 24 hours: Temp:  [97.7 F (36.5 C)-98.2 F (36.8 C)] 98.2 F (36.8 C) (09/14 0338) Pulse Rate:  [55-68] 60 (09/14 0729) Cardiac Rhythm: Normal sinus rhythm (09/14 0706) Resp:  [13-19] 19 (09/14 0729) BP: (106-110)/(69-81) 106/78 (09/14 0729) SpO2:  [93 %-96 %] 96 % (09/14 0729) Weight:  [52.6 kg] 52.6 kg (09/14 0500)     Intake/Output from previous day: 09/13 0701 - 09/14 0700 In: 487 [P.O.:477; I.V.:10] Out: 500 [Urine:500] Intake/Output this shift: No intake/output data recorded.  General appearance: alert, cooperative, and no distress Heart: regular rate and rhythm,Lungs: clear to auscultation bilaterally Abdomen: soft, continues to have some abdomijnal soreness relaed to the feeding tube.  Extremities: extremities thin, no edema Wound: clean and dry  Lab Results: No results for input(s): WBC, HGB, HCT, PLT in the last 72 hours. BMET: No results for input(s): NA, K, CL, CO2, GLUCOSE, BUN, CREATININE, CALCIUM in the last 72 hours.  PT/INR: No results for input(s): LABPROT, INR in the last 72 hours. ABG    Component Value Date/Time   PHART 7.414 09/08/2020 0444   HCO3 20.7 09/08/2020 0444   TCO2 22 09/08/2020 0444   ACIDBASEDEF 3.0 (H) 09/08/2020 0444   O2SAT 97.0 09/08/2020 0444   CBG (last 3)  No results for input(s): GLUCAP in the last 72 hours.  Assessment/Plan: S/P Procedure(s) (LRB): XI ROBOTIC ASSISTED THORASCOPY-DECORTICATION & REVISION OF ESOPHAGECTOMY (Right) ESOPHAGOGASTRODUODENOSCOPY (EGD)  (N/A) INTERCOSTAL NERVE BLOCK (Right)  42 days post revision of esophagectomy after esophageal rupture following Ivor Lewis esophagectomy Pulm- trach has been decannulated, stable respiratory status, neck dressing is dry. Malnutrition- patient has been evaluated by SLP is on a dysphagia 3 diet, tolerating, calorie count in progress, continuing tube feeds at night to supplement calories.  CV- remains hemodynamically stable in NSR and on Midodrine Deconditioning- severe, will require SNF  Plan: [facility] team is working on placement options   LOS: 48 days    Leary Roca, PA-C [phone] 10/19/2020  Agree with above. Patient is not stable for home discharge, and will require SNF. Unclear as to why his insurance refuses to cover this hospitalization since it is warranted.  [place] Winslow Keane Scrape

## 2020-10-19 NOTE — TOC Progression Note (Signed)
Transition of Care [facility]) - Progression Note    Patient Details  Name: Ralph [name] MRN: [id number] Date of Birth: [birth year]  [facility]) CM/SW Contact  Reece Agar, [place] Phone Number: 10/19/2020, 11:15 AM  Clinical Narrative:    CSW met with [name] to discuss pt offer at [facility] will not be able to take pt at this time, if pt participates more in PT, [facility] will reevaluate. CSW will follow up with DTP options.    Expected Discharge Plan:  (Vent/SNF) Barriers to Discharge: Continued Medical Work up  Expected Discharge Plan and Services Expected Discharge Plan:  (Vent/SNF) In-house Referral: Clinical Social Work     Living arrangements for the past 2 months: Single Family Home                                       Social Determinants of Health (SDOH) Interventions    Readmission Risk Interventions No flowsheet data found.

## 2020-10-20 ENCOUNTER — Other Ambulatory Visit (HOSPITAL_BASED_OUTPATIENT_CLINIC_OR_DEPARTMENT_OTHER): Payer: Self-pay

## 2020-10-20 MED ORDER — PANTOPRAZOLE 2 MG/ML SUSPENSION
40.0000 mg | Freq: Every day | ORAL | Status: DC
  Administered 2020-10-21 – 2020-10-25 (×5): 40 mg
  Filled 2020-10-20: qty 20

## 2020-10-20 NOTE — Progress Notes (Signed)
Speech Language Pathology Treatment: Dysphagia  Patient Details Name: Ralph [name] MRN: [id number] DOB: 02-04-1966 Today's Date: 10/20/2020 Time: 8833-7445 SLP Time Calculation (min) (ACUTE ONLY): 8 min  Assessment / Plan / Recommendation Clinical Impression  Followed up for diet tolerance. Pt reports PO intake has slowly improved since diet upgrade. Overall PO intake remains decreased however pt with nocturnal feeds and PO supplements, followed by RD. Pt declined PO trials with SLP with date due to some cramping in stomach, RN aware. Discussed safe swallowing strategies, pt reports gravies with meats would be helpful due to dentition status. Provided pt with hospital menu for ordering preference foods. No further ST needs identified.    HPI HPI: 55 y/o male admitted 7/28 for an Ivor-Lewis esophagectomy due to benign lower esophageal stricture and protein malnutrition.   8/3 pt returned to OR for esophageal rupture with exploration and repair with Jtube replacement. He remained intubated 7/28-8/5; reintubated 8/5 until trach 8/9.  Had episodes of asymptomatic asystole on 8/17 and bradycardia. Cardiology believes these may be vagally mediated. Decannulated 9/8 and started on dysphagia 2/thin liquids same date.  PMHx: anxiety, CAD, MI, Stroke, multiple balloon dilations to esophageal stricture.      SLP Plan  All goals met;Discharge SLP treatment due to (comment)      Recommendations for follow up therapy are one component of a multi-disciplinary discharge planning process, led by the attending physician.  Recommendations may be updated based on patient status, additional functional criteria and insurance authorization.    Recommendations  Diet recommendations: Dysphagia 3 (mechanical soft);Thin liquid Liquids provided via: Cup;Straw;Teaspoon Medication Administration: Crushed with puree Supervision: Patient able to self feed Compensations: Small sips/bites;Slow rate Postural Changes and/or  Swallow Maneuvers: Seated upright 90 degrees;Upright 30-60 min after meal                Oral Care Recommendations: Oral care BID Follow up Recommendations: Skilled Nursing facility SLP Visit Diagnosis: Dysphagia, unspecified (R13.10) Plan: All goals met;Discharge SLP treatment due to (comment)       [place], CCC-SLP Acute Rehabilitation Services    10/20/2020, 3:54 PM

## 2020-10-20 NOTE — Progress Notes (Addendum)
[address]       [employer] [address]             [phone]      43 Days Post-Op Procedure(s) (LRB): XI ROBOTIC ASSISTED THORASCOPY-DECORTICATION & REVISION OF ESOPHAGECTOMY (Right) ESOPHAGOGASTRODUODENOSCOPY (EGD) (N/A) INTERCOSTAL NERVE BLOCK (Right) Subjective: Conts to slowly feel better  Objective: Vital signs in last 24 hours: Temp:  [97.6 F (36.4 C)-98.1 F (36.7 C)] 98.1 F (36.7 C) (09/15 0318) Pulse Rate:  [55-67] 67 (09/15 0500) Cardiac Rhythm: Sinus bradycardia (09/14 2004) Resp:  [13-19] 19 (09/15 0500) BP: (100-136)/(65-88) 136/88 (09/15 0500) SpO2:  [94 %-96 %] 95 % (09/15 0500) Weight:  [53.1 kg] 53.1 kg (09/15 0500)  Hemodynamic parameters for last 24 hours:    Intake/Output from previous day: 09/14 0701 - 09/15 0700 In: -  Out: 425 [Urine:425] Intake/Output this shift: No intake/output data recorded.  General appearance: alert, cooperative, cachectic, and no distress Heart: regular rate and rhythm Lungs: dim left>right base Abdomen: soft, nontender or distended Extremities: no edema Wound: incis healing well  Lab Results: No results for input(s): WBC, HGB, HCT, PLT in the last 72 hours. BMET: No results for input(s): NA, K, CL, CO2, GLUCOSE, BUN, CREATININE, CALCIUM in the last 72 hours.  PT/INR: No results for input(s): LABPROT, INR in the last 72 hours. ABG    Component Value Date/Time   PHART 7.414 09/08/2020 0444   HCO3 20.7 09/08/2020 0444   TCO2 22 09/08/2020 0444   ACIDBASEDEF 3.0 (H) 09/08/2020 0444   O2SAT 97.0 09/08/2020 0444   CBG (last 3)  No results for input(s): GLUCAP in the last 72 hours.  Meds Scheduled Meds:  chlorhexidine gluconate (MEDLINE KIT)  15 mL Mouth Rinse BID   Chlorhexidine Gluconate Cloth  6 each Topical Daily   enoxaparin (LOVENOX) injection  40 mg Subcutaneous QHS   escitalopram  10 mg Per Tube QHS   feeding supplement (JEVITY 1.5 CAL/FIBER)  1,000 mL Per Tube Q24H   feeding  supplement (PROSource TF)  45 mL Per Tube TID   Gerhardt's butt cream   Topical QID   influenza vac split quadrivalent PF  0.5 mL Intramuscular Tomorrow-1000   lactose free nutrition  237 mL Oral TID BM   lidocaine  1 patch Transdermal Q24H   mouth rinse  15 mL Mouth Rinse q12n4p   melatonin  10 mg Per Tube QHS   midodrine  10 mg Per J Tube TID   pantoprazole sodium  40 mg Per Tube Daily   sodium chloride flush  10-40 mL Intracatheter Q12H   Continuous Infusions:  sodium chloride Stopped (10/01/20 1802)   PRN Meds:.sodium chloride, acetaminophen (TYLENOL) oral liquid 160 mg/5 mL, albuterol, atropine, HYDROcodone-acetaminophen, loperamide HCl **OR** loperamide, ondansetron, oxyCODONE, polyethylene glycol, sodium chloride flush  Xrays No results found.  Assessment/Plan: S/P Procedure(s) (LRB): XI ROBOTIC ASSISTED THORASCOPY-DECORTICATION & REVISION OF ESOPHAGECTOMY (Right) ESOPHAGOGASTRODUODENOSCOPY (EGD) (N/A) INTERCOSTAL NERVE BLOCK (Right)  1 afeb, VSS 2 sats good on RA 3 no new labs 4 conts tube feeds, with significant malnutrition- no appetite but intake is a little better. Also getting supplements 5 placement remains an issue- hopefully will continue to improve in therapy and can go to rehab or possible home with health    LOS: 49 days    John Giovanni PA-C Pager [phone] 10/20/2020    Agree with above. Patient is not stable for home discharge, and will require SNF. Unclear as to  why his insurance refuses to cover this hospitalization since it is warranted.   Ralph [name]

## 2020-10-20 NOTE — Progress Notes (Signed)
Occupational Therapy Treatment Patient Details Name: Ralph [name] MRN: [id number] DOB: [birth year] Today's Date: 10/20/2020   History of present illness Pt is a 55 y.o. male admitted 09/01/20 for an Ivor-Lewis esophagectomy due to benign lower esophageal stricture and protein malnutrition.  Return to OR 8/3 for esophageal rupture with exploration/repair with J-tube placement. S/p trach placement 8/9. Trach decannulated 9/8. PMH includes stroke, CAD, MI, anxiety, multiple ballon dilations to esophageal stricture.   OT comments  Pt progressing well towards OT goals. Session focused on OOB ADLs at sink. Pt able to mobilize to/from sink using RW at Supervision level. Pt able to complete grooming tasks standing without AD at setup though fatigues quickly due to decreased strength. Encouraged energy conservation strategies (use of shower chair at home) for bathing tasks. Pt overall Supervision for sponge bathing task, Min A at most for mgmt of wrap around brief. Discussed use of pull up briefs to increase independent mgmt. Based on functional presentation, pt may be able to DC home (with 24/7 support initially to ensure concerns noted below addressed) if progress consistent.  Discussed DC plan and areas of support needed at home. Pt reports multiple in-laws live next door in motel and may be able to assist if wife cannot. Discussed need for tube feed mgmt (reports doing this independently with last discharge), ability to manage modified diet, etc with further interdisciplinary team discussions needed for pt/family education.    Recommendations for follow up therapy are one component of a multi-disciplinary discharge planning process, led by the attending physician.  Recommendations may be updated based on patient status, additional functional criteria and insurance authorization.    Follow Up Recommendations  Home health OT;Supervision/Assistance - 24 hour (pending ability to manage steps to 2nd floor motel  room)    Equipment Recommendations  Tub/shower seat;Other (comment) (Rollator)    Recommendations for Other Services      Precautions / Restrictions Precautions Precautions: Fall;Other (comment) Precaution Comments: J-tube; bowel/bladder incontinence Restrictions Weight Bearing Restrictions: No       Mobility Bed Mobility Overal bed mobility: Modified Independent Bed Mobility: Supine to Sit;Sit to Supine           General bed mobility comments: No assist needed, light use of bed rails and increased time with HOB elevated    Transfers Overall transfer level: Needs assistance Equipment used: Rolling walker (2 wheeled) Transfers: Sit to/from Stand Sit to Stand: Supervision         General transfer comment: no assist needed to stand from bedside using RW    Balance Overall balance assessment: Needs assistance Sitting-balance support: No upper extremity supported Sitting balance-Leahy Scale: Good     Standing balance support: Bilateral upper extremity supported Standing balance-Leahy Scale: Fair Standing balance comment: fair static standing for oral care standing at sink, use of BUE support for mobility                           ADL either performed or assessed with clinical judgement   ADL Overall ADL's : Needs assistance/impaired     Grooming: Set up;Standing;Oral care Grooming Details (indicate cue type and reason): able to stand without AD to brush teeth < 2 min. no LOB Upper Body Bathing: Set up;Sitting   Lower Body Bathing: Supervison/ safety;Sit to/from stand Lower Body Bathing Details (indicate cue type and reason): no AD, no LOB - easily fatigued Upper Body Dressing : Set up;Sitting   Lower Body Dressing: Minimal assistance;Sit  to/from stand Lower Body Dressing Details (indicate cue type and reason): Min A to manage wrap around briefs; discussed how pull up briefs may be easier             Functional mobility during ADLs: Rolling  walker;Supervision/safety General ADL Comments: Session focused on standing tolerance and completion of bathing task at sink. Pt able to mobilize to/from sink using RW, limited by fatigue and completed remainder of task seated after brushing teeth in standing. Discussed DC plan - support with pt reporting in-laws live next door in motel and that wife may work shorter days vs long days with varying schedule     Vision   Vision Assessment?: No apparent visual deficits   Perception     Praxis      Cognition Arousal/Alertness: Awake/alert Behavior During Therapy: Flat affect Overall Cognitive Status: No family/caregiver present to determine baseline cognitive functioning                                 General Comments: Decr logical reasoning skills with regards to steps needed to get home but [facility] to follow directions, complete ADLs, etc. Self limiting with depressed affect        Exercises     Shoulder Instructions       General Comments VSS on RA; discussed DC plan, concerns about managing tube feeds though pt reports managing this at home after last discharge. Pt also reports in-laws live next door in motel and may be able to assist. Collaborated with NT and pt on increasing activity tolerance - encouraged UE HEP completion later today or walk with NT in room    Pertinent Vitals/ Pain       Pain Assessment: 0-10 Pain Score: 8  Pain Location: chest/stomach Pain Descriptors / Indicators: Discomfort Pain Intervention(s): Monitored during session  Home Living                                          Prior Functioning/Environment              Frequency  Min 2X/week        Progress Toward Goals  OT Goals(current goals can now be found in the care plan section)  Progress towards OT goals: Progressing toward goals  Acute Rehab OT Goals Patient Stated Goal: go home OT Goal Formulation: With patient Time For Goal Achievement:  11/01/20 Potential to Achieve Goals: Good ADL Goals Pt Will Perform Grooming: with modified independence;standing Pt Will Perform Lower Body Bathing: with modified independence;sitting/lateral leans;sit to/from stand Pt Will Transfer to Toilet: with modified independence;ambulating Pt/caregiver will Perform Home Exercise Program: Increased strength;Both right and left upper extremity;With theraband;Independently;With written HEP provided  Plan Discharge plan needs to be updated    Co-evaluation                 AM-PAC OT "6 Clicks" Daily Activity     Outcome Measure   Help from another person eating meals?: None Help from another person taking care of personal grooming?: A Little Help from another person toileting, which includes using toliet, bedpan, or urinal?: A Little Help from another person bathing (including washing, rinsing, drying)?: A Little Help from another person to put on and taking off regular upper body clothing?: A Little Help from another person to put on and  taking off regular lower body clothing?: A Little 6 Click Score: 19    End of Session Equipment Utilized During Treatment: Rolling walker  OT Visit Diagnosis: Unsteadiness on feet (R26.81);Other abnormalities of gait and mobility (R26.89);Muscle weakness (generalized) (M62.81);Pain   Activity Tolerance Patient tolerated treatment well   Patient Left in bed;with call bell/phone within reach   Nurse Communication Mobility status        Time: 0315-9458 OT Time Calculation (min): 32 min  Charges: OT General Charges $OT Visit: 1 Visit OT Treatments $Self Care/Home Management : 23-37 mins  Bradd Canary, OTR/L Acute Rehab Services Office: [phone]   Lorre Munroe 10/20/2020, 2:31 PM

## 2020-10-20 NOTE — TOC Progression Note (Signed)
Transition of Care [facility]) - Progression Note    Patient Details  Name: GRAHM ETSITTY MRN: [id number] Date of Birth: [birth year]  [facility]) CM/SW Contact  Ivette Loyal, [place] Phone Number: 10/20/2020, 2:58 PM  Clinical Narrative:    CSW spoke with Malena Peer at [facility] who will review pt information again for offer.  PT messaged CSW stating pt has had great progress and may be able to go home soon. CSW will follow up with [facility] for inpatient rehab and wait to hear back from [facility].    Expected Discharge Plan:  (Vent/SNF) Barriers to Discharge: Continued Medical Work up  Expected Discharge Plan and Services Expected Discharge Plan:  (Vent/SNF) In-house Referral: Clinical Social Work     Living arrangements for the past 2 months: Single Family Home                                       Social Determinants of Health (SDOH) Interventions    Readmission Risk Interventions No flowsheet data found.

## 2020-10-21 NOTE — TOC Progression Note (Signed)
Transition of Care [facility]) - Progression Note    Patient Details  Name: Ralph [name] MRN: [id number] Date of Birth: [birth year]  Transition of Care [facility]) CM/SW Contact  Ivette Loyal, [place] Phone Number: 10/21/2020, 2:06 PM  Clinical Narrative:    CSW spoke with Revonda Standard from [facility], she is willing to take pt with Medicaid and states we will have to wait until auth comes back so it may be next week. Csw spoke to pt about going to the [facility], pt was agreeable. CSW will follow up with the facility for auth daily.   Expected Discharge Plan:  (Vent/SNF) Barriers to Discharge: Continued Medical Work up  Expected Discharge Plan and Services Expected Discharge Plan:  (Vent/SNF) In-house Referral: Clinical Social Work     Living arrangements for the past 2 months: Single Family Home                                       Social Determinants of Health (SDOH) Interventions    Readmission Risk Interventions No flowsheet data found.

## 2020-10-21 NOTE — Progress Notes (Signed)
Nutrition Follow-up  DOCUMENTATION CODES:   Underweight, Severe malnutrition in context of chronic illness  INTERVENTION:   Continue nocturnal tube feeds via J-tube: - Jevity 1.5 @ 100 ml/hr x 10 hours from 1900 to 0500 (total of 1000 ml) - ProSource TF 45 ml TID   Nocturnal tube feeding regimen provides 1620 kcal, 97 grams of protein, and 760 ml of H2O (meets 70% of kcal needs and 84% of protein needs).   - Boost Plus po TID between meals, each supplement provides 360 kcal and 14 grams of protein    - Encourage PO intake at meals  NUTRITION DIAGNOSIS:   Severe Malnutrition related to chronic illness (recurrent esophageal stricture s/p J-tube) as evidenced by severe muscle depletion, severe fat depletion.  Ongoing, being addressed via TF and oral nutrition supplements  GOAL:   Patient will meet greater than or equal to 90% of their needs  Progressing  MONITOR:   PO intake, Diet advancement, Supplement acceptance, Weight trends, TF tolerance, Skin, I & O's  REASON FOR ASSESSMENT:   Consult Enteral/tube feeding initiation and management  ASSESSMENT:   55 year old male who presented on 7/28 for esophagogastroduodenoscopy and robotic assisted esophagectomy. PMH of esophageal stricture s/p multiple dilations and J-tube placement in April 2022, stroke, CAD, MI, anxiety.  8/26 - clear liquids 9/06 - trach capped 9/08 - decannulated, diet advanced to dysphagia 2 with thin liquids 9/11 - transitioned to nocturnal tube feeds  Spoke with pt at bedside. He reports that he is doing "alright." He states that he did eat some breakfast. Meal completion charted as 60% this morning. Pt reports that he had a large bowel movement this morning due to not being able to take imodium last night as there was not any on the unit. He states that he just took some imodium prior to RD visit. Pt understands plan to continue nocturnal tube feeds. He does not feel like the tube feeds are impacting  his appetite as he is still feeling quite hungry. Pt drinking Boost Plus occasionally. RD encouraged pt to continue to drink supplements and increase PO intake as able.  First measured weight: 61 kg on 8/03 Current weight: 53.8 kg  Current TF: Jevity 1.5 @ 100 ml/hr x 10 hours, ProSource TF 45 ml TID  Medications reviewed and include: Boost Plus TID, melatonin, protonix  Labs reviewed.  Diet Order:   Diet Order             DIET DYS 3 Room service appropriate? Yes with Assist; Fluid consistency: Thin  Diet effective now                   EDUCATION NEEDS:   Education needs have been addressed  Skin:  Skin Assessment: Skin Integrity Issues: Stage II: sacrum Incisions: abdomen, R chest x 2  Last BM:  10/21/20 large type 7  Height:   Ht Readings from Last 1 Encounters:  10/06/20 5\' 8"  (1.727 m)    Weight:   Wt Readings from Last 1 Encounters:  10/21/20 53.8 kg    BMI:  Body mass index is 18.03 kg/m.  Estimated Nutritional Needs:   Kcal:  2300-2600 kcals  Protein:  115-135 grams  Fluid:  >/= 2.0 L    10/23/20, MS, RD, LDN Inpatient Clinical Dietitian Please see AMiON for contact information.

## 2020-10-21 NOTE — Progress Notes (Addendum)
Brief Nutrition Note  Received page from TCTS PA regarding pt's tube feeds. Per discussion with PA, Dr. Cliffton Asters would like to hold nocturnal tube feeds for a few days as pt is continuing to have diarrhea which is limiting his ability to participate with therapies. RD expressed concern with holding pt's nocturnal tube feeds given severity of malnutrition and high kcal and protein needs. Pt has not been able to gain weight despite high amount of kcal and protein provided via tube feeds.  PA states that they will closely monitor pt's PO intake over the weekend and restart tube feeds if PO intake remains poor for >48 hours. Discussed option to try a completely different tube feeding formula (fiber-free) with PA. Will leave recommendations for nocturnal tube feeds of Osmolite 1.5.  If PO intake remains poor for >48 hours, recommend resuming nocturnal tube feeds via J-tube: - Osmolite 1.5 @ 100 ml/hr x 10 hours from 1900 to 0500 (total of 1000 ml) - ProSource TF 45 ml TID  Nocturnal tube feeding regimen provides 1620 kcal, 96 grams of protein, and 762 ml of H2O (meets 70% of kcal needs and 83% of protein needs).  TCTS starting a 48-hour calorie count for pt. RD to follow up on Monday, 10/24/20.   Mertie Clause, MS, RD, LDN Inpatient Clinical Dietitian Please see AMiON for contact information.

## 2020-10-21 NOTE — Progress Notes (Addendum)
[address]       [place] [address]             [phone]      44 Days Post-Op Procedure(s) (LRB): XI ROBOTIC ASSISTED THORASCOPY-DECORTICATION & REVISION OF ESOPHAGECTOMY (Right) ESOPHAGOGASTRODUODENOSCOPY (EGD) (N/A) INTERCOSTAL NERVE BLOCK (Right) Subjective: Says he is feeling better  Objective: Vital signs in last 24 hours: Temp:  [97.6 F (36.4 C)-98.3 F (36.8 C)] 97.6 F (36.4 C) (09/16 0305) Pulse Rate:  [60-61] 60 (09/16 0305) Cardiac Rhythm: Sinus bradycardia (09/15 1944) Resp:  [15-20] 15 (09/16 0305) BP: (98-115)/(64-80) 98/68 (09/16 0305) SpO2:  [95 %-96 %] 96 % (09/16 0305) Weight:  [53.8 kg] 53.8 kg (09/16 0541)  Hemodynamic parameters for last 24 hours:    Intake/Output from previous day: 09/15 0701 - 09/16 0700 In: -  Out: 575 [Urine:575] Intake/Output this shift: Total I/O In: -  Out: 575 [Urine:575]  General appearance: alert, cooperative, cachectic, and no distress Heart: regular rate and rhythm Lungs: dim left base Abdomen: soft, non tender, non distended Extremities: no edema or calf tenderness Wound: incis healing well  Lab Results: No results for input(s): WBC, HGB, HCT, PLT in the last 72 hours. BMET: No results for input(s): NA, K, CL, CO2, GLUCOSE, BUN, CREATININE, CALCIUM in the last 72 hours.  PT/INR: No results for input(s): LABPROT, INR in the last 72 hours. ABG    Component Value Date/Time   PHART 7.414 09/08/2020 0444   HCO3 20.7 09/08/2020 0444   TCO2 22 09/08/2020 0444   ACIDBASEDEF 3.0 (H) 09/08/2020 0444   O2SAT 97.0 09/08/2020 0444   CBG (last 3)  No results for input(s): GLUCAP in the last 72 hours.  Meds Scheduled Meds:  chlorhexidine gluconate (MEDLINE KIT)  15 mL Mouth Rinse BID   Chlorhexidine Gluconate Cloth  6 each Topical Daily   enoxaparin (LOVENOX) injection  40 mg Subcutaneous QHS   escitalopram  10 mg Per Tube QHS   feeding supplement (JEVITY 1.5 CAL/FIBER)  1,000 mL Per  Tube Q24H   feeding supplement (PROSource TF)  45 mL Per Tube TID   Gerhardt's butt cream   Topical QID   influenza vac split quadrivalent PF  0.5 mL Intramuscular Tomorrow-1000   lactose free nutrition  237 mL Oral TID BM   lidocaine  1 patch Transdermal Q24H   mouth rinse  15 mL Mouth Rinse q12n4p   melatonin  10 mg Per Tube QHS   midodrine  10 mg Per J Tube TID   pantoprazole sodium  40 mg Per Tube Daily   sodium chloride flush  10-40 mL Intracatheter Q12H   Continuous Infusions:  sodium chloride Stopped (10/01/20 1802)   PRN Meds:.sodium chloride, acetaminophen (TYLENOL) oral liquid 160 mg/5 mL, albuterol, atropine, HYDROcodone-acetaminophen, loperamide HCl **OR** loperamide, ondansetron, oxyCODONE, polyethylene glycol, sodium chloride flush  Xrays No results found.  Assessment/Plan: S/P Procedure(s) (LRB): XI ROBOTIC ASSISTED THORASCOPY-DECORTICATION & REVISION OF ESOPHAGECTOMY (Right) ESOPHAGOGASTRODUODENOSCOPY (EGD) (N/A) INTERCOSTAL NERVE BLOCK (Right)  1 afeb, VSS sinus brady 2 sats good on RA 3 no new labs 4 has been advanced to D3 diet 5 conts to advance slowly in terms of therapies, MD wants SNF/rehab prior to d/c home- placement has been difficult but he is doing more with rehab modalities   LOS: 50 days    John Giovanni PA-C Pager [phone] 10/21/2020    Dispo planning Will transition off tubefeeds  Lajuana Matte

## 2020-10-21 NOTE — Plan of Care (Signed)
  Problem: Education: Goal: Knowledge of the prescribed therapeutic regimen will improve Outcome: Progressing   Problem: Bowel/Gastric: Goal: Gastrointestinal status for postoperative course will improve Outcome: Progressing   Problem: Nutritional: Goal: Ability to achieve adequate nutritional intake will improve Outcome: Progressing   Problem: Clinical Measurements: Goal: Postoperative complications will be avoided or minimized Outcome: Progressing   Problem: Respiratory: Goal: Ability to maintain a clear airway will improve Outcome: Progressing   Problem: Education: Goal: Knowledge of General Education information will improve Description: Including pain rating scale, medication(s)/side effects and non-pharmacologic comfort measures Outcome: Progressing

## 2020-10-21 NOTE — Progress Notes (Signed)
Physical Therapy Treatment Patient Details Name: Ralph [name] MRN: [id number] DOB: 05-16-1965 Today's Date: 10/21/2020   History of Present Illness Pt is a 55 y.o. male admitted 09/01/20 for an Ivor-Lewis esophagectomy due to benign lower esophageal stricture and protein malnutrition.  Return to OR 8/3 for esophageal rupture with exploration/repair with J-tube placement. S/p trach placement 8/9. Trach decannulated 9/8. PMH includes stroke, CAD, MI, anxiety, multiple ballon dilations to esophageal stricture.   PT Comments    Pt progressing with mobility. Today's session focused on ambulation for strengthening and increasing activity tolerance. Pt remains limited by generalized weakness, decreased activity tolerance, and impaired balance strategies/postural reactions. Still with safety concerns for pt's return home as he has a flight of steps to hotel room; pt reports agreeable to stair training next session. Continue to recommend SNF-level therapies to maximize functional mobility and independence prior to return home if adequate family assist not available.    Recommendations for follow up therapy are one component of a multi-disciplinary discharge planning process, led by the attending physician.  Recommendations may be updated based on patient status, additional functional criteria and insurance authorization.  Follow Up Recommendations  SNF     Equipment Recommendations  3in1 (PT);Other (comment) (rollator)    Recommendations for Other Services       Precautions / Restrictions Precautions Precautions: Fall;Other (comment) Precaution Comments: J-tube; bowel/bladder incontinence Restrictions Weight Bearing Restrictions: No     Mobility  Bed Mobility Overal bed mobility: Modified Independent Bed Mobility: Supine to Sit;Sit to Supine           General bed mobility comments: Light use of bed rails, bed mostly flat    Transfers Overall transfer level: Needs  assistance Equipment used: 4-wheeled walker Transfers: Sit to/from Stand Sit to Stand: Supervision         General transfer comment: Cues for locking rollator brakes prior to sit<>stand, supervision for safety/lines  Ambulation/Gait Ambulation/Gait assistance: Supervision Gait Distance (Feet): 60 Feet (+100') Assistive device: 4-wheeled walker Gait Pattern/deviations: Step-through pattern;Decreased stride length;Trunk flexed Gait velocity: decreased   General Gait Details: Slow, fatigued gait with rollator and supervision for safety/lines; cues for activity pacing; distance limited by fatigue; 1x standing rest break secondary to fatigue and pain   Stairs             Wheelchair Mobility    Modified Rankin (Stroke Patients Only)       Balance Overall balance assessment: Needs assistance Sitting-balance support: No upper extremity supported Sitting balance-Leahy Scale: Good     Standing balance support: Bilateral upper extremity supported;No upper extremity supported;During functional activity Standing balance-Leahy Scale: Fair Standing balance comment: can static stand without UE support, preference for BUE support for dynamic balance                            Cognition Arousal/Alertness: Awake/alert Behavior During Therapy: Flat affect Overall Cognitive Status: No family/caregiver present to determine baseline cognitive functioning                                 General Comments: Decreased insight, awareness and logical reasoning skills with regards to current functional mobility and what pt needs to be capable of physically to return home safely; pt with little to no ownership of d/c plan. Self-limiting behaviors with flat (depressed?) affect      Exercises      General  Comments General comments (skin integrity, edema, etc.): After ambulation, pt declines additional mobility or therex due to fatigue. At end of session, pt reports,  "I feel better after walking"      Pertinent Vitals/Pain Pain Assessment: 0-10 Pain Score: 6  Pain Location: Abdomen Pain Descriptors / Indicators: Discomfort Pain Intervention(s): Monitored during session;Premedicated before session    Home Living                      Prior Function            PT Goals (current goals can now be found in the care plan section) Acute Rehab PT Goals Patient Stated Goal: go home PT Goal Formulation: With patient Time For Goal Achievement: 11/04/20 Potential to Achieve Goals: Fair Progress towards PT goals: Progressing toward goals    Frequency    Min 2X/week      PT Plan Current plan remains appropriate    Co-evaluation              AM-PAC PT "6 Clicks" Mobility   Outcome Measure  Help needed turning from your back to your side while in a flat bed without using bedrails?: None Help needed moving from lying on your back to sitting on the side of a flat bed without using bedrails?: None Help needed moving to and from a bed to a chair (including a wheelchair)?: A Little Help needed standing up from a chair using your arms (e.g., wheelchair or bedside chair)?: A Little Help needed to walk in hospital room?: A Little Help needed climbing 3-5 steps with a railing? : A Lot 6 Click Score: 19    End of Session Equipment Utilized During Treatment: Gait belt Activity Tolerance: Patient tolerated treatment well Patient left: in bed;with call bell/phone within reach Nurse Communication: Mobility status PT Visit Diagnosis: Other abnormalities of gait and mobility (R26.89);Muscle weakness (generalized) (M62.81);Difficulty in walking, not elsewhere classified (R26.2)     Time: 8850-2774 PT Time Calculation (min) (ACUTE ONLY): 14 min  Charges:  $Therapeutic Exercise: 8-22 mins                     Ina Homes, PT, DPT Acute Rehabilitation Services  Pager [phone] Office [phone]  Ralph [name] 10/21/2020, 5:18  PM

## 2020-10-22 NOTE — Progress Notes (Addendum)
    [address]       [place] [address]             [phone]      45 Days Post-Op Procedure(s) (LRB): XI ROBOTIC ASSISTED THORASCOPY-DECORTICATION & REVISION OF ESOPHAGECTOMY (Right) ESOPHAGOGASTRODUODENOSCOPY (EGD) (N/A) INTERCOSTAL NERVE BLOCK (Right) Subjective: Sleeping but awakened easily. Eager to try to eat breakfast. Assisted him with getting breakfast tray positioned.  1 bowel movement over night.   Objective: Vital signs in last 24 hours: Temp:  [97.8 F (36.6 C)-97.9 F (36.6 C)] 97.9 F (36.6 C) (09/17 0313) Pulse Rate:  [57-69] 57 (09/17 0313) Cardiac Rhythm: Sinus bradycardia (09/17 0700) Resp:  [15-20] 16 (09/17 0313) BP: (99-115)/(57-80) 106/80 (09/17 0313) SpO2:  [94 %-95 %] 95 % (09/17 0313) Weight:  [53.1 kg] 53.1 kg (09/17 0610)  Hemodynamic parameters for last 24 hours:    Intake/Output from previous day: 09/16 0701 - 09/17 0700 In: 240 [P.O.:240] Out: 100 [Urine:100] Intake/Output this shift: No intake/output data recorded.  General appearance: alert, cooperative, cachectic, and no distress Heart: regular rate and rhythm Lungs: dim left base Abdomen: soft, mild tenderness associated with the feeding tube, non distended Extremities: no edema or calf tenderness Wound: Well-healed  Lab Results: No results for input(s): WBC, HGB, HCT, PLT in the last 72 hours. BMET: No results for input(s): NA, K, CL, CO2, GLUCOSE, BUN, CREATININE, CALCIUM in the last 72 hours.  PT/INR: No results for input(s): LABPROT, INR in the last 72 hours. ABG    Component Value Date/Time   PHART 7.414 09/08/2020 0444   HCO3 20.7 09/08/2020 0444   TCO2 22 09/08/2020 0444   ACIDBASEDEF 3.0 (H) 09/08/2020 0444   O2SAT 97.0 09/08/2020 0444   CBG (last 3)  No results for input(s): GLUCAP in the last 72 hours.  Meds Scheduled Meds:  chlorhexidine gluconate (MEDLINE KIT)  15 mL Mouth Rinse BID   Chlorhexidine Gluconate Cloth  6 each Topical  Daily   enoxaparin (LOVENOX) injection  40 mg Subcutaneous QHS   escitalopram  10 mg Per Tube QHS   feeding supplement (PROSource TF)  45 mL Per Tube TID   Gerhardt's butt cream   Topical QID   influenza vac split quadrivalent PF  0.5 mL Intramuscular Tomorrow-1000   lactose free nutrition  237 mL Oral TID BM   lidocaine  1 patch Transdermal Q24H   mouth rinse  15 mL Mouth Rinse q12n4p   melatonin  10 mg Per Tube QHS   midodrine  10 mg Per J Tube TID   pantoprazole sodium  40 mg Per Tube Daily   sodium chloride flush  10-40 mL Intracatheter Q12H   Continuous Infusions:  sodium chloride Stopped (10/01/20 1802)   PRN Meds:.sodium chloride, acetaminophen (TYLENOL) oral liquid 160 mg/5 mL, albuterol, atropine, HYDROcodone-acetaminophen, loperamide HCl **OR** loperamide, ondansetron, oxyCODONE, polyethylene glycol, sodium chloride flush  Xrays No results found.  Assessment/Plan: S/P Procedure(s) (LRB): XI ROBOTIC ASSISTED THORASCOPY-DECORTICATION & REVISION OF ESOPHAGECTOMY (Right) ESOPHAGOGASTRODUODENOSCOPY (EGD) (N/A) INTERCOSTAL NERVE BLOCK (Right)  1 afeb, VSS sinus rhythm 2 sats good on RA 3 no new labs 4 has been advanced to D3 diet but oral intake has been poor.  We are giving him a trial of holding the tube feeding for 48 hours to see if this will improve his appetite and decrease his diarrhea so that he will more apt to participate in therapy.  We will watch calorie counts closely. 5 disposition: The care management   team has received an agreement with [facility] to accept Mr. Riecke once insurance is approved next week.   LOS: 51 days    Myron G. Roddenberry PA-C Pager [phone] 10/22/2020   Patient seen and examined, agree with above No new issues To [facility] Monday   C. , MD [facility] [phone]    

## 2020-10-22 NOTE — Plan of Care (Signed)
  Problem: Education: Goal: Knowledge of the prescribed therapeutic regimen will improve Outcome: Progressing   Problem: Bowel/Gastric: Goal: Gastrointestinal status for postoperative course will improve Outcome: Progressing   Problem: Nutritional: Goal: Ability to achieve adequate nutritional intake will improve Outcome: Progressing   Problem: Respiratory: Goal: Ability to maintain a clear airway will improve Outcome: Progressing   Problem: Skin Integrity: Goal: Demonstration of wound healing without infection will improve Outcome: Progressing   Problem: Education: Goal: Knowledge of General Education information will improve Description: Including pain rating scale, medication(s)/side effects and non-pharmacologic comfort measures Outcome: Progressing   Problem: Health Behavior/Discharge Planning: Goal: Ability to manage health-related needs will improve Outcome: Progressing   Problem: Clinical Measurements: Goal: Ability to maintain clinical measurements within normal limits will improve Outcome: Progressing Goal: Will remain free from infection Outcome: Progressing Goal: Diagnostic test results will improve Outcome: Progressing Goal: Respiratory complications will improve Outcome: Progressing Goal: Cardiovascular complication will be avoided Outcome: Progressing   Problem: Activity: Goal: Risk for activity intolerance will decrease Outcome: Progressing   Problem: Nutrition: Goal: Adequate nutrition will be maintained Outcome: Progressing

## 2020-10-22 NOTE — TOC Progression Note (Signed)
Transition of Care [facility]) - Progression Note    Patient Details  Name: RUTGER SALTON MRN: [id number] Date of Birth: 06-Mar-1965  [facility] CM/SW Contact  Patrice [place], [place] Phone Number: [phone] 10/22/2020, 12:12 PM  Clinical Narrative:     CSW spoke with Revonda Standard at [facility] and she informed CSW that authorization is still pending and most likely will not come back this weekend.  TOC team will continue to assist with discharge planning needs.    Expected Discharge Plan:  (Vent/SNF) Barriers to Discharge: Continued Medical Work up  Expected Discharge Plan and Services Expected Discharge Plan:  (Vent/SNF) In-house Referral: Clinical Social Work     Living arrangements for the past 2 months: Single Family Home                                       Social Determinants of Health (SDOH) Interventions    Readmission Risk Interventions No flowsheet data found.

## 2020-10-23 NOTE — Progress Notes (Addendum)
[address]       [place] [address]             [phone]      46 Days Post-Op Procedure(s) (LRB): XI ROBOTIC ASSISTED THORASCOPY-DECORTICATION & REVISION OF ESOPHAGECTOMY (Right) ESOPHAGOGASTRODUODENOSCOPY (EGD) (N/A) INTERCOSTAL NERVE BLOCK (Right) Subjective: Awake and alert, no new problems.  Was able to eeat ~50% of the meals brought to him yesterday. Calorie count in progress. 1 bowel movement pas 24 hours.  Objective: Vital signs in last 24 hours: Temp:  [97.7 F (36.5 C)-97.9 F (36.6 C)] 97.7 F (36.5 C) (09/18 0547) Pulse Rate:  [58-70] 66 (09/18 0547) Cardiac Rhythm: Sinus bradycardia (09/18 0700) Resp:  [16-25] 16 (09/18 0547) BP: (100-124)/(67-84) 124/78 (09/18 0547) SpO2:  [95 %-96 %] 96 % (09/18 0547) Weight:  [52.7 kg] 52.7 kg (09/18 0547)    Intake/Output from previous day: 09/17 0701 - 09/18 0700 In: 340 [P.O.:340] Out: 1276 [Urine:1275; Stool:1] Intake/Output this shift: No intake/output data recorded.  General appearance: alert, cooperative, cachectic, and no distress Heart: regular rate and rhythm Abdomen: soft, mild tenderness associated with the feeding tube, non distended. The feeding tube exit site has minimal drainage and is only mildly inflamed. Extremities: no edema or calf tenderness Wound: Well-healed  Lab Results: No results for input(s): WBC, HGB, HCT, PLT in the last 72 hours. BMET: No results for input(s): NA, K, CL, CO2, GLUCOSE, BUN, CREATININE, CALCIUM in the last 72 hours.  PT/INR: No results for input(s): LABPROT, INR in the last 72 hours. ABG    Component Value Date/Time   PHART 7.414 09/08/2020 0444   HCO3 20.7 09/08/2020 0444   TCO2 22 09/08/2020 0444   ACIDBASEDEF 3.0 (H) 09/08/2020 0444   O2SAT 97.0 09/08/2020 0444   CBG (last 3)  No results for input(s): GLUCAP in the last 72 hours.  Meds Scheduled Meds:  chlorhexidine gluconate (MEDLINE KIT)  15 mL Mouth Rinse BID   Chlorhexidine  Gluconate Cloth  6 each Topical Daily   enoxaparin (LOVENOX) injection  40 mg Subcutaneous QHS   escitalopram  10 mg Per Tube QHS   feeding supplement (PROSource TF)  45 mL Per Tube TID   Gerhardt's butt cream   Topical QID   influenza vac split quadrivalent PF  0.5 mL Intramuscular Tomorrow-1000   lactose free nutrition  237 mL Oral TID BM   lidocaine  1 patch Transdermal Q24H   mouth rinse  15 mL Mouth Rinse q12n4p   melatonin  10 mg Per Tube QHS   midodrine  10 mg Per J Tube TID   pantoprazole sodium  40 mg Per Tube Daily   sodium chloride flush  10-40 mL Intracatheter Q12H   Continuous Infusions:  sodium chloride Stopped (10/01/20 1802)   PRN Meds:.sodium chloride, acetaminophen (TYLENOL) oral liquid 160 mg/5 mL, albuterol, atropine, HYDROcodone-acetaminophen, loperamide HCl **OR** loperamide, ondansetron, oxyCODONE, polyethylene glycol, sodium chloride flush  Xrays No results found.  Assessment/Plan: S/P Procedure(s) (LRB): XI ROBOTIC ASSISTED THORASCOPY-DECORTICATION & REVISION OF ESOPHAGECTOMY (Right) ESOPHAGOGASTRODUODENOSCOPY (EGD) (N/A) INTERCOSTAL NERVE BLOCK (Right)  1 afeb, VSS sinus rhythm 2 sats good on RA 3 no new labs 4 Continue D3 diet encouraging him to increase his intake.   We are giving him a trial of holding the tube feeding for 48 hours to see if this will improve his appetite and decrease his diarrhea so that he will more apt to participate in therapy.  We will watch calorie counts  closely. 5 Disposition: The care management team has received an agreement with [facility] to accept Mr. Lia once insurance is approved next week.   LOS: 22 days   Antony Odea, PA-C  Patient seen and examined, agree with above Looks good today, eating better  Remo Lipps C. Roxan Hockey, MD [facility] [phone]

## 2020-10-24 LAB — ACID FAST CULTURE WITH REFLEXED SENSITIVITIES (MYCOBACTERIA): Acid Fast Culture: NEGATIVE

## 2020-10-24 LAB — SARS CORONAVIRUS 2 (TAT 6-24 HRS): SARS Coronavirus 2: NEGATIVE

## 2020-10-24 MED ORDER — PANTOPRAZOLE SODIUM 40 MG PO PACK: 40.0000 mg | PACK | Freq: Every day | ORAL | 0 refills | Status: AC

## 2020-10-24 MED ORDER — BOOST PLUS PO LIQD: 237.0000 mL | Freq: Three times a day (TID) | ORAL | 0 refills | Status: DC

## 2020-10-24 MED ORDER — LOPERAMIDE HCL 1 MG/7.5ML PO SUSP: 2.0000 mg | ORAL | 0 refills | Status: DC | PRN

## 2020-10-24 MED ORDER — ESCITALOPRAM OXALATE 10 MG PO TABS: 10.0000 mg | ORAL_TABLET | Freq: Every day | ORAL | 0 refills | Status: DC

## 2020-10-24 MED ORDER — MELATONIN 10 MG PO TABS: 10.0000 mg | ORAL_TABLET | Freq: Every day | ORAL | 0 refills | Status: DC

## 2020-10-24 MED ORDER — CHLORHEXIDINE GLUCONATE 0.12% ORAL RINSE (MEDLINE KIT): 15.0000 mL | Freq: Two times a day (BID) | OROMUCOSAL | 0 refills | Status: DC

## 2020-10-24 MED ORDER — ACETAMINOPHEN 160 MG/5ML PO SOLN: 650.0000 mg | Freq: Three times a day (TID) | ORAL | 0 refills | Status: DC | PRN

## 2020-10-24 MED ORDER — ONDANSETRON HCL 4 MG PO TABS: 4.0000 mg | ORAL_TABLET | Freq: Four times a day (QID) | ORAL | 0 refills | Status: DC | PRN

## 2020-10-24 MED ORDER — MELATONIN 10 MG PO TABS: 10.0000 mg | ORAL_TABLET | Freq: Every day | ORAL | 0 refills | Status: AC

## 2020-10-24 MED ORDER — MIDODRINE HCL 10 MG PO TABS: 10.0000 mg | ORAL_TABLET | Freq: Three times a day (TID) | ORAL | 0 refills | Status: DC

## 2020-10-24 MED ORDER — PANTOPRAZOLE SODIUM 40 MG PO PACK: 40.0000 mg | PACK | Freq: Every day | ORAL | 0 refills | Status: DC

## 2020-10-24 MED ORDER — HYDROCODONE-ACETAMINOPHEN 7.5-325 MG/15ML PO SOLN: 15.0000 mL | Freq: Four times a day (QID) | ORAL | 0 refills | Status: DC | PRN

## 2020-10-24 MED ORDER — PROSOURCE TF PO LIQD: 45.0000 mL | Freq: Three times a day (TID) | ORAL | 0 refills | Status: DC

## 2020-10-24 MED ORDER — OSMOLITE 1.5 CAL PO LIQD: 1000.0000 mL | ORAL | Status: DC

## 2020-10-24 MED ORDER — ACETAMINOPHEN 160 MG/5ML PO SOLN: 650.0000 mg | Freq: Three times a day (TID) | ORAL | 0 refills | Status: AC | PRN

## 2020-10-24 MED ORDER — OSMOLITE 1.5 CAL PO LIQD: 1000.0000 mL | ORAL | 0 refills | Status: DC

## 2020-10-24 MED ORDER — CHLORHEXIDINE GLUCONATE CLOTH 2 % EX PADS: 6.0000 | MEDICATED_PAD | Freq: Every day | CUTANEOUS | 0 refills | Status: DC

## 2020-10-24 MED ORDER — LOPERAMIDE HCL 1 MG/7.5ML PO SUSP: 2.0000 mg | ORAL | 0 refills | Status: AC | PRN

## 2020-10-24 MED ORDER — OSMOLITE 1.5 CAL PO LIQD: 1000.0000 mL | ORAL | 0 refills | Status: AC

## 2020-10-24 MED ORDER — MIDODRINE HCL 10 MG PO TABS: 10.0000 mg | ORAL_TABLET | Freq: Three times a day (TID) | ORAL | 0 refills | Status: AC

## 2020-10-24 NOTE — Progress Notes (Signed)
As of this note, Covid swab is not resulted.  Per [facility] they will not accept pt without a negative result.  [facility] will also not accept pt after 9 pm.  Per [facility] drive is 45 minutes.  Pt will be unable to discharge to [facility].  Will plan for discharge tomorrow.  Spoke with Harrison at [facility].

## 2020-10-24 NOTE — Progress Notes (Signed)
Physical Therapy Treatment Patient Details Name: Ralph [name] MRN: [id number] DOB: [birth year] Today's Date: 10/24/2020   History of Present Illness Pt is a 55 y.o. male admitted 09/01/20 for an Ivor-Lewis esophagectomy due to benign lower esophageal stricture and protein malnutrition.  Return to OR 8/3 for esophageal rupture with exploration/repair with J-tube placement. S/p trach placement 8/9. Trach decannulated 9/8. PMH includes stroke, CAD, MI, anxiety, multiple ballon dilations to esophageal stricture.    PT Comments    Pt making progress with mobility but still lacks the functional activity tolerance to perform all tasks associated with  being at home alone. Continue to recommend SNF.    Recommendations for follow up therapy are one component of a multi-disciplinary discharge planning process, led by the attending physician.  Recommendations may be updated based on patient status, additional functional criteria and insurance authorization.  Follow Up Recommendations  SNF     Equipment Recommendations  3in1 (PT);Other (comment) (rollator)    Recommendations for Other Services       Precautions / Restrictions Precautions Precautions: Fall;Other (comment) Precaution Comments: J-tube; bowel/bladder incontinence     Mobility  Bed Mobility Overal bed mobility: Modified Independent Bed Mobility: Supine to Sit;Sit to Supine     Supine to sit: Modified independent (Device/Increase time);HOB elevated Sit to supine: Modified independent (Device/Increase time);HOB elevated        Transfers Overall transfer level: Needs assistance Equipment used: 4-wheeled walker Transfers: Sit to/from Stand Sit to Stand: Supervision         General transfer comment: Supervision for safety.  Ambulation/Gait Ambulation/Gait assistance: Supervision Gait Distance (Feet): 200 Feet Assistive device: 4-wheeled walker Gait Pattern/deviations: Step-through pattern;Decreased stride length;Trunk  flexed Gait velocity: decreased Gait velocity interpretation: 1.31 - 2.62 ft/sec, indicative of limited community ambulator General Gait Details: Assist for safety.   Stairs Stairs: Yes Stairs assistance: Min assist Stair Management: Two rails;With walker;Forwards;Backwards Number of Stairs: 2 (1 portable step x 2) General stair comments: Used walker straddling portable step so that walker could simulate rails. Stepped up forwards and down backwards.   Wheelchair Mobility    Modified Rankin (Stroke Patients Only)       Balance Overall balance assessment: Needs assistance Sitting-balance support: No upper extremity supported Sitting balance-Leahy Scale: Good     Standing balance support: No upper extremity supported Standing balance-Leahy Scale: Fair                              Cognition Arousal/Alertness: Awake/alert Behavior During Therapy: Flat affect Overall Cognitive Status: No family/caregiver present to determine baseline cognitive functioning                                 General Comments: Follows commands. Poor insight into logical steps to return home and manage himself there.      Exercises      General Comments        Pertinent Vitals/Pain Pain Assessment: Faces Faces Pain Scale: Hurts little more Pain Location: Abdomen Pain Descriptors / Indicators: Grimacing Pain Intervention(s): Premedicated before session;Monitored during session    Home Living                      Prior Function            PT Goals (current goals can now be found in the care plan section) Acute Rehab PT  Goals Patient Stated Goal: go home PT Goal Formulation: With patient Time For Goal Achievement: 11/07/20 Potential to Achieve Goals: Fair Progress towards PT goals: Progressing toward goals    Frequency    Min 2X/week      PT Plan Current plan remains appropriate    Co-evaluation              AM-PAC PT "6  Clicks" Mobility   Outcome Measure  Help needed turning from your back to your side while in a flat bed without using bedrails?: None Help needed moving from lying on your back to sitting on the side of a flat bed without using bedrails?: None Help needed moving to and from a bed to a chair (including a wheelchair)?: A Little Help needed standing up from a chair using your arms (e.g., wheelchair or bedside chair)?: A Little Help needed to walk in hospital room?: A Little Help needed climbing 3-5 steps with a railing? : A Lot 6 Click Score: 19    End of Session   Activity Tolerance: Patient tolerated treatment well Patient left: in bed;with call bell/phone within reach   PT Visit Diagnosis: Other abnormalities of gait and mobility (R26.89);Muscle weakness (generalized) (M62.81);Difficulty in walking, not elsewhere classified (R26.2)     Time: 4132-4401 PT Time Calculation (min) (ACUTE ONLY): 13 min  Charges:  $Gait Training: 8-22 mins                     [facility] PT Acute Rehabilitation Services Pager [phone] Office [phone]    Angelina Ok [facility] 10/24/2020, 2:17 PM

## 2020-10-24 NOTE — TOC Transition Note (Addendum)
Transition of Care [facility]) - CM/SW Discharge Note   Patient Details  Name: Ralph [name] MRN: [id number] Date of Birth: January 21, 1966  Transition [facility] Care [facility]) CM/SW Contact:  Lynett Grimes Phone Number: 10/24/2020, 4:16 PM   Clinical Narrative:    Patient will DC to: [facility]  Anticipated DC date: 10/25/2020 Family notified: Pt Spouse Transport by: Sharin Mons   Per MD patient ready for DC to [facility] room 413. RN to call report prior to discharge [phone]). RN, patient, patient's family, and facility notified of DC. Discharge Summary and FL2 sent to facility. DC packet on chart. Ambulance transport requested for patient.   CSW will sign off for now as social work intervention is no longer needed. Please consult [place] again if new needs arise.       Barriers to Discharge: Continued Medical Work up   Patient Goals and CMS Choice   CMS Medicare.gov Compare Post Acute Care list provided to:: Patient Represenative (must comment) (patientts significant other [name]) Choice offered to / list presented to :  (Patients signigicant other [name])  Discharge Placement                       Discharge Plan and Services In-house Referral: Clinical Social Work                                   Social Determinants of Health (SDOH) Interventions     Readmission Risk Interventions No flowsheet data found.

## 2020-10-24 NOTE — Progress Notes (Signed)
      [address]       [place] [address]             [phone]      47 Days Post-Op Procedure(s) (LRB): XI ROBOTIC ASSISTED THORASCOPY-DECORTICATION & REVISION OF ESOPHAGECTOMY (Right) ESOPHAGOGASTRODUODENOSCOPY (EGD) (N/A) INTERCOSTAL NERVE BLOCK (Right) Subjective: Feels okay this morning, sleepy when I entered the room  Objective: Vital signs in last 24 hours: Temp:  [97.7 F (36.5 C)-98.3 F (36.8 C)] 98 F (36.7 C) (09/19 0332) Pulse Rate:  [50-58] 58 (09/19 0332) Cardiac Rhythm: Sinus bradycardia (09/19 0713) Resp:  [16-20] 17 (09/19 0332) BP: (121-134)/(77-89) 124/77 (09/19 0332) SpO2:  [93 %-97 %] 96 % (09/19 0332) Weight:  [52.6 kg] 52.6 kg (09/19 0332)     Intake/Output from previous day: 09/18 0701 - 09/19 0700 In: 40 [I.V.:40] Out: 825 [Urine:825] Intake/Output this shift: No intake/output data recorded.  General appearance: alert, cooperative, and no distress Heart: regular rate and rhythm, S1, S2 normal, no murmur, click, rub or gallop Lungs: clear to auscultation bilaterally Abdomen: soft, non-tender; bowel sounds normal; no masses,  no organomegaly Extremities: extremities normal, atraumatic, no cyanosis or edema Wound: clean and dry  Lab Results: No results for input(s): WBC, HGB, HCT, PLT in the last 72 hours. BMET: No results for input(s): NA, K, CL, CO2, GLUCOSE, BUN, CREATININE, CALCIUM in the last 72 hours.  PT/INR: No results for input(s): LABPROT, INR in the last 72 hours. ABG    Component Value Date/Time   PHART 7.414 09/08/2020 0444   HCO3 20.7 09/08/2020 0444   TCO2 22 09/08/2020 0444   ACIDBASEDEF 3.0 (H) 09/08/2020 0444   O2SAT 97.0 09/08/2020 0444   CBG (last 3)  No results for input(s): GLUCAP in the last 72 hours.  Assessment/Plan: S/P Procedure(s) (LRB): XI ROBOTIC ASSISTED THORASCOPY-DECORTICATION & REVISION OF ESOPHAGECTOMY (Right) ESOPHAGOGASTRODUODENOSCOPY (EGD) (N/A) INTERCOSTAL NERVE BLOCK  (Right)  1 NSR in the 60s, BP well controlled, afebrile.  2 sats good on RA, no acute issues 3 no new labs. Will order for tomorrow 4 Continue D3 diet encouraging him to increase his intake. Holding TF for 48 hours and doing a calorie count. Appreciate nutrition and SLP assistance.  5. Diarrhea-improving and more willing to work with PT  Disposition: The care management team has received an agreement with [facility] to accept Mr. Lieder once insurance is approved this week.  Will order some additional labs for tomorrow. Mr. Sainsbury wants to know more about the facility and its location. I will leave this up to the [facility] team to discuss.    LOS: 53 days    Sharlene Dory 10/24/2020

## 2020-10-24 NOTE — Progress Notes (Signed)
Nutrition Follow-up  DOCUMENTATION CODES:   Underweight, Severe malnutrition in context of chronic illness  INTERVENTION:   Resume nocturnal TF via J-tube: Osmolite 1.5 at 100 ml/h x 10 hours from 1900-0500 (total of 1000 ml) Prosource TF 45 ml TID  Provides 1620 kcal, 96 gm protein, and 762 ml free water daily to meet 70% of estimated kcal needs and 83% of estimated protein needs.  Continue to offer Boost Plus PO TID between meals, each supplement provides 360 kcal and 14 gm protein  Encourage PO intake of meals and supplements.   NUTRITION DIAGNOSIS:   Severe Malnutrition related to chronic illness (recurrent esophageal stricture s/p J-tube) as evidenced by severe muscle depletion, severe fat depletion.  Ongoing  GOAL:   Patient will meet greater than or equal to 90% of their needs  Unmet  MONITOR:   PO intake, Diet advancement, Supplement acceptance, Weight trends, TF tolerance, Skin, I & O's  REASON FOR ASSESSMENT:   Consult Enteral/tube feeding initiation and management  ASSESSMENT:   55 year old male who presented on 7/28 for esophagogastroduodenoscopy and robotic assisted esophagectomy. PMH of esophageal stricture s/p multiple dilations and J-tube placement in April 2022, stroke, CAD, MI, anxiety.  Patient not drinking the Boost Plus supplements because "they don't set well" with his stomach. He says he has not had diarrhea this morning. He thinks he is eating a lot better than last week and does not want to resume nocturnal tube feedings via PEG. Two untouched Boost Plus supplements sitting on bedside table. He did not eat breakfast this morning.   Currently on a dysphagia 3 diet with thin liquids. Receiving magic cups with meals. Also being offered Boost Plus TID between meals.   Calorie Count: 9/16 Dinner = 265 kcal, 13 gm protein 9/17 Breakfast = 245 kcal, 12 gm protein 9/17 Lunch = 213 kcal, 13 gm protein 9/17 Dinner = 500 kcal, 24 gm protein 9/18  Breakfast = 142 kcal, 7 gm protein 9/18 Lunch = 295 kcal, 15 gm protein  Average daily intake is 830 kcal and 42 gm protein, meeting ~36% of minimum estimated calorie and protein needs.   Labs reviewed.  Medications reviewed and include Protonix.  Given ongoing inadequate oral intake, will resume nocturnal TF per discussion with TCTS PA.  Diet Order:   Diet Order             DIET DYS 3 Room service appropriate? Yes with Assist; Fluid consistency: Thin  Diet effective now                   EDUCATION NEEDS:   Education needs have been addressed  Skin:  Skin Assessment: Skin Integrity Issues: Skin Integrity Issues:: Stage II DTI: n/a Stage II: sacrum Incisions: abdoment, R chest x 2  Last BM:  9/18  Height:   Ht Readings from Last 1 Encounters:  10/06/20 5\' 8"  (1.727 m)    Weight:   Wt Readings from Last 1 Encounters:  10/24/20 52.6 kg    BMI:  Body mass index is 17.63 kg/m.  Estimated Nutritional Needs:   Kcal:  2300-2600 kcals  Protein:  115-135 g  Fluid:  >/= 2.0 L    10/26/20, RD, LDN, CNSC Please refer to Amion for contact information.

## 2020-10-25 LAB — CBC
HCT: 36.7 % — ABNORMAL LOW (ref 39.0–52.0)
Hemoglobin: 12.1 g/dL — ABNORMAL LOW (ref 13.0–17.0)
MCH: 29.2 pg (ref 26.0–34.0)
MCHC: 33 g/dL (ref 30.0–36.0)
MCV: 88.6 fL (ref 80.0–100.0)
Platelets: 331 10*3/uL (ref 150–400)
RBC: 4.14 MIL/uL — ABNORMAL LOW (ref 4.22–5.81)
RDW: 15.1 % (ref 11.5–15.5)
WBC: 7.9 10*3/uL (ref 4.0–10.5)
nRBC: 0 % (ref 0.0–0.2)

## 2020-10-25 LAB — BASIC METABOLIC PANEL
Anion gap: 11 (ref 5–15)
BUN: 13 mg/dL (ref 6–20)
CO2: 23 mmol/L (ref 22–32)
Calcium: 9.7 mg/dL (ref 8.9–10.3)
Chloride: 99 mmol/L (ref 98–111)
Creatinine, Ser: 0.51 mg/dL — ABNORMAL LOW (ref 0.61–1.24)
GFR, Estimated: >60 mL/min (ref >60)
Glucose, Bld: 93 mg/dL (ref 70–99)
Potassium: 4.2 mmol/L (ref 3.5–5.1)
Sodium: 133 mmol/L — ABNORMAL LOW (ref 135–145)

## 2020-10-25 NOTE — Plan of Care (Signed)
  Problem: Education: Goal: Knowledge of the prescribed therapeutic regimen will improve Outcome: Adequate for Discharge   Problem: Bowel/Gastric: Goal: Gastrointestinal status for postoperative course will improve Outcome: Adequate for Discharge   Problem: Nutritional: Goal: Ability to achieve adequate nutritional intake will improve Outcome: Adequate for Discharge   Problem: Clinical Measurements: Goal: Postoperative complications will be avoided or minimized Outcome: Adequate for Discharge   Problem: Respiratory: Goal: Ability to maintain a clear airway will improve Outcome: Adequate for Discharge   Problem: Skin Integrity: Goal: Demonstration of wound healing without infection will improve Outcome: Adequate for Discharge   Problem: Education: Goal: Knowledge of General Education information will improve Description: Including pain rating scale, medication(s)/side effects and non-pharmacologic comfort measures Outcome: Adequate for Discharge   Problem: Health Behavior/Discharge Planning: Goal: Ability to manage health-related needs will improve Outcome: Adequate for Discharge   Problem: Clinical Measurements: Goal: Ability to maintain clinical measurements within normal limits will improve Outcome: Adequate for Discharge Goal: Will remain free from infection Outcome: Adequate for Discharge Goal: Diagnostic test results will improve Outcome: Adequate for Discharge Goal: Respiratory complications will improve Outcome: Adequate for Discharge Goal: Cardiovascular complication will be avoided Outcome: Adequate for Discharge   Problem: Activity: Goal: Risk for activity intolerance will decrease Outcome: Adequate for Discharge   Problem: Nutrition: Goal: Adequate nutrition will be maintained Outcome: Adequate for Discharge   Problem: Coping: Goal: Level of anxiety will decrease Outcome: Adequate for Discharge   Problem: Elimination: Goal: Will not experience  complications related to bowel motility Outcome: Adequate for Discharge Goal: Will not experience complications related to urinary retention Outcome: Adequate for Discharge   Problem: Pain Managment: Goal: General experience of comfort will improve Outcome: Adequate for Discharge   Problem: Safety: Goal: Ability to remain free from injury will improve Outcome: Adequate for Discharge   Problem: Skin Integrity: Goal: Risk for impaired skin integrity will decrease Outcome: Adequate for Discharge   Problem: Safety: Goal: Non-violent Restraint(s) Outcome: Adequate for Discharge

## 2020-10-25 NOTE — Progress Notes (Signed)
Pt being transported to [facility] room 413 by PT[facility]. Pt belongings sent with patient. Patient has possession of glasses clipped to T shirt and cell phone in hand. VSS at discharge. PICC removed 9/19. RN called report to [facility] given to Leonel Ramsay, RN.

## 2020-10-25 NOTE — Plan of Care (Signed)
  Problem: Nutritional: Goal: Ability to achieve adequate nutritional intake will improve Outcome: Progressing   Problem: Clinical Measurements: Goal: Postoperative complications will be avoided or minimized Outcome: Progressing   Problem: Respiratory: Goal: Ability to maintain a clear airway will improve Outcome: Progressing   Problem: Skin Integrity: Goal: Demonstration of wound healing without infection will improve Outcome: Progressing

## 2020-10-25 NOTE — Progress Notes (Signed)
      [address]       [place] [address]             [phone]      48 Days Post-Op Procedure(s) (LRB): XI ROBOTIC ASSISTED THORASCOPY-DECORTICATION & REVISION OF ESOPHAGECTOMY (Right) ESOPHAGOGASTRODUODENOSCOPY (EGD) (N/A) INTERCOSTAL NERVE BLOCK (Right) Subjective: Feels okay this morning, he isn't taking the tube feeds at night because he doesn't want to have diarrhea again.   Objective: Vital signs in last 24 hours: Temp:  [97.3 F (36.3 C)-98 F (36.7 C)] 98 F (36.7 C) (09/20 0700) Pulse Rate:  [52-65] 54 (09/20 0700) Cardiac Rhythm: Sinus bradycardia (09/20 0708) Resp:  [13-20] 18 (09/20 0700) BP: (105-142)/(77-89) 114/80 (09/20 0700) SpO2:  [94 %-97 %] 96 % (09/20 0700) Weight:  [52.5 kg] 52.5 kg (09/20 0500)     Intake/Output from previous day: 09/19 0701 - 09/20 0700 In: -  Out: 950 [Urine:950] Intake/Output this shift: No intake/output data recorded.  General appearance: alert, cooperative, and no distress, thin and looks malnourished.  Heart: regular rate and rhythm, S1, S2 normal, no murmur, click, rub or gallop Lungs: clear to auscultation bilaterally Abdomen: soft, non-tender; bowel sounds normal; no masses,  no organomegaly Extremities: extremities normal, atraumatic, no cyanosis or edema Wound: clean and dry  Lab Results: No results for input(s): WBC, HGB, HCT, PLT in the last 72 hours. BMET: No results for input(s): NA, K, CL, CO2, GLUCOSE, BUN, CREATININE, CALCIUM in the last 72 hours.  PT/INR: No results for input(s): LABPROT, INR in the last 72 hours. ABG    Component Value Date/Time   PHART 7.414 09/08/2020 0444   HCO3 20.7 09/08/2020 0444   TCO2 22 09/08/2020 0444   ACIDBASEDEF 3.0 (H) 09/08/2020 0444   O2SAT 97.0 09/08/2020 0444   CBG (last 3)  No results for input(s): GLUCAP in the last 72 hours.  Assessment/Plan: S/P Procedure(s) (LRB): XI ROBOTIC ASSISTED THORASCOPY-DECORTICATION & REVISION OF ESOPHAGECTOMY  (Right) ESOPHAGOGASTRODUODENOSCOPY (EGD) (N/A) INTERCOSTAL NERVE BLOCK (Right)  1 NSR in the 50s, BP well controlled, afebrile.  2 sats good on RA, no acute issues 3 Labs pending.  4 Continue D3 diet encouraging him to increase his intake. He is only in-taking about 37% pf his daily calorie needs. Appreciate nutrition and SLP assistance. He needs more oral intake but is refusing the TF due to his concern that his diarrhea will return.  5. Diarrhea-improving since starting on oral and more willing to work with PT  Plan: COVID negative, this held up his discharge yesterday. Planning to discharge today to facility. Nightly TF ordered and would ask facility to do a daily calorie count. This is written in the discharge summary.   LOS: 54 days    Sharlene Dory 10/25/2020

## 2020-11-04 ENCOUNTER — Ambulatory Visit: Payer: Medicaid Other | Admitting: Thoracic Surgery (Cardiothoracic Vascular Surgery)

## 2020-11-09 ENCOUNTER — Other Ambulatory Visit: Payer: Self-pay

## 2020-11-09 ENCOUNTER — Ambulatory Visit (INDEPENDENT_AMBULATORY_CARE_PROVIDER_SITE_OTHER): Payer: Self-pay | Admitting: Thoracic Surgery (Cardiothoracic Vascular Surgery)

## 2020-11-09 VITALS — BP 120/83 | HR 56 | Resp 20 | Wt 122.0 lb

## 2020-11-09 DIAGNOSIS — K222 Esophageal obstruction: Secondary | ICD-10-CM

## 2020-11-09 DIAGNOSIS — Z09 Encounter for follow-up examination after completed treatment for conditions other than malignant neoplasm: Secondary | ICD-10-CM

## 2020-11-09 MED ORDER — OXYCODONE-ACETAMINOPHEN 7.5-325 MG PO TABS: 1.0000 | ORAL_TABLET | ORAL | 0 refills | Status: AC | PRN

## 2020-11-09 NOTE — Progress Notes (Signed)
      [address]       [place] [address]             [phone]        SAVERIO KADER [facility] Medical Record #[id number] Date of Birth: [birth year]  Referring: Moses Manners, MD Primary Care: Pcp, No Primary Cardiologist:Bridgette Cristal Deer, MD  Reason for visit:   follow-up  History of Present Illness:     [name] presents for his 1 week follow-up appointment after hospital discharge.  He is a 55 year old male that underwent a robotic assisted esophagectomy for tight esophageal stricture.  His surgery was complicated by postoperative leak which was repaired robotically as well.  I had a prolonged hospitalization due to respiratory dependence and malnutrition.  Since being discharged she is doing quite well.  He has not required any tube feeding since discharge and was put on about 10 pounds.  He denies any dysphagia or odynophagia.  He is able to tolerate oral food.  He occasionally has some difficulty with pills.  Physical Exam: BP 120/83 (BP Location: Left Arm, Patient Position: Sitting)   Pulse (!) 56   Resp 20   Wt 122 lb (55.3 kg)   SpO2 98%   BMI 18.55 kg/m   Alert NAD Incision clean.  Abdomen soft, ND.  Jejunostomy tube was removed at the bedside. No peripheral edema   Diagnostic Studies & Laboratory data:  Path: SURGICAL PATHOLOGY  CASE: [id number]  PATIENT: Ralph [name]  Surgical Pathology Report      Clinical History: esophageal stricture (cm)      FINAL MICROSCOPIC DIAGNOSIS:   A. ESOPHAGUS, ESOPHAGECTOMY:  - Chronic nonspecific esophagitis with ulceration, fibrosis and  associated stricture  - Portion of benign unremarkable proximal stomach  - No evidence of malignancy  - Benign lymph nodes  - Margins appear viable    Assessment / Plan:   55 year old male status post robotic assisted esophagectomy for a fibrotic stricture. He continues to do well.  He is gaining weight.  I gave him a refill on his pain  medication.  He will see me back in 1 month with a CT chest.    Corliss Skains 11/09/2020 11:50 AM

## 2020-11-18 ENCOUNTER — Ambulatory Visit: Payer: Medicaid Other | Admitting: Thoracic Surgery (Cardiothoracic Vascular Surgery)

## 2020-11-22 ENCOUNTER — Other Ambulatory Visit: Payer: Self-pay | Admitting: Thoracic Surgery (Cardiothoracic Vascular Surgery)

## 2020-11-22 DIAGNOSIS — K222 Esophageal obstruction: Secondary | ICD-10-CM

## 2020-12-16 ENCOUNTER — Inpatient Hospital Stay: Admission: RE | Admit: 2020-12-16 | Payer: Medicaid Other | Source: Ambulatory Visit

## 2020-12-16 ENCOUNTER — Ambulatory Visit: Payer: Medicaid Other | Admitting: Thoracic Surgery (Cardiothoracic Vascular Surgery)

## 2021-01-06 ENCOUNTER — Ambulatory Visit: Payer: Medicaid Other | Admitting: Thoracic Surgery (Cardiothoracic Vascular Surgery)

## 2021-01-11 ENCOUNTER — Ambulatory Visit
Admission: RE | Admit: 2021-01-11 | Discharge: 2021-01-11 | Disposition: A | Discharge status: Home / Self Care | Payer: Medicaid Other | Source: Ambulatory Visit | Attending: Thoracic Surgery (Cardiothoracic Vascular Surgery) | Admitting: Thoracic Surgery (Cardiothoracic Vascular Surgery)

## 2021-01-11 ENCOUNTER — Other Ambulatory Visit: Payer: Medicaid Other

## 2021-01-11 DIAGNOSIS — K222 Esophageal obstruction: Secondary | ICD-10-CM

## 2021-01-20 ENCOUNTER — Other Ambulatory Visit: Payer: Self-pay

## 2021-01-20 ENCOUNTER — Ambulatory Visit: Payer: Medicaid Other | Admitting: Thoracic Surgery (Cardiothoracic Vascular Surgery)

## 2021-01-20 VITALS — BP 143/89 | HR 66 | Resp 20 | Ht 68.0 in | Wt 136.0 lb

## 2021-01-20 DIAGNOSIS — K222 Esophageal obstruction: Secondary | ICD-10-CM | POA: Diagnosis not present

## 2021-01-23 NOTE — Progress Notes (Signed)
° °   °  [address]       [place] [address]             [phone]        Ralph [name] [facility] Medical Record #[id number] Date of Birth: 1965-12-06  Referring: Ralph Manners, MD Primary Care: Pcp, No Primary Cardiologist:Ralph Cristal Deer, MD  Reason for visit:   follow-up  History of Present Illness:     [name] comes in for his 1 month follow-up appointment.  Overall he is doing quite well.  He is tolerating a regular diet without any difficulties.  He denies any reflux symptoms, and has managed to put on some weight.  Physical Exam: BP (!) 143/89    Pulse 66    Resp 20    Ht 5\' 8"  (1.727 m)    Wt 136 lb (61.7 kg)    SpO2 98% Comment: RA   BMI 20.68 kg/m   Alert NAD Incision clean, well-healed.   Abdomen soft, ND No peripheral edema   Diagnostic Studies & Laboratory data: CT chest: IMPRESSION: 1. 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 No findings suspicious for recurrent tumor, mediastinal/hilar adenopathy or pulmonary metastatic disease. 2. Stable emphysematous changes and pulmonary scarring. 3. Very small residual left pleural effusion and some adjacent areas of parenchymal nodularity. These could be areas of rounded atelectasis or scarring. Attention on future scans is suggested. 4. Stable age advanced three-vessel coronary artery calcifications.  Assessment / Plan:   55 year old male status post robotic assisted esophagectomy for benign esophageal stricture.  Overall he is doing well.  He will follow-up as needed.   40 01/23/2021 1:36 PM

## 2021-07-10 ENCOUNTER — Ambulatory Visit (INDEPENDENT_AMBULATORY_CARE_PROVIDER_SITE_OTHER): Payer: Medicaid Other | Admitting: Family Medicine

## 2021-07-10 ENCOUNTER — Encounter: Payer: Self-pay | Admitting: Family Medicine

## 2021-07-10 DIAGNOSIS — M792 Neuralgia and neuritis, unspecified: Secondary | ICD-10-CM

## 2021-07-10 DIAGNOSIS — G8929 Other chronic pain: Secondary | ICD-10-CM | POA: Diagnosis not present

## 2021-07-10 DIAGNOSIS — H269 Unspecified cataract: Secondary | ICD-10-CM

## 2021-07-10 DIAGNOSIS — F339 Major depressive disorder, recurrent, unspecified: Secondary | ICD-10-CM

## 2021-07-10 MED ORDER — ESCITALOPRAM OXALATE 10 MG PO TABS: 10.0000 mg | ORAL_TABLET | Freq: Every day | ORAL | 3 refills | Status: DC

## 2021-07-10 MED ORDER — GABAPENTIN 300 MG PO CAPS: 300.0000 mg | ORAL_CAPSULE | Freq: Three times a day (TID) | ORAL | 3 refills | Status: AC

## 2021-07-10 NOTE — Patient Instructions (Addendum)
I have put in a referral for a new eye doctor.  Someone should call I refilled your depression medicine (escatalopram) and your nerve pain medicine (gabapentin) It would help you to get the narcotics.  It will be hard.  I strongly recommend you quit by cutting back 1/2 pill every week until you are off.  See me in six weeks.  I will do my best to help.

## 2021-07-10 NOTE — Progress Notes (Unsigned)
    SUBJECTIVE:   CHIEF COMPLAINT / HPI:   Tomi is here to reestablish care.  He has a host of issues, of which we focused on two major and a few minor: Chronic pain, He has been getting prolonged oxycodone/APAP 10 325 taking three times per day from another clinic.  He asks if he can get back on his nerve pain medication for back, chest and abd pain.   Depressed: Obvious external sources of stress sources.  Cannot work.  Inexplicably, he has been denied disability again.  He has a Clinical research associate working on it.  He has not been on his antidepressant medications.  No SI.  Just very frustrated.  On top of it, he is unable to drive due to bilateral cataracts.  See below. Less urgent issues Bilateral cataracts.  See at [facility] eye center and told bilateral cataracts.  They referred to a Ophthalmologist in [facility] and he missed the appointment.  Has Medicaid.  Needs new referral. Recent hospitalization for "SBO"  notes in care everywhere.  Treated non operatively.  Also has right inguinal hernia per report.   Chronic protein calorie malnutrition preceding his esophageal stricture surgery.  Has gained some of his weight back but still thin. Late effects of CVA    OBJECTIVE:   BP 131/87   Pulse 64   Ht 5\' 8"  (1.727 m)   Wt 137 lb 3.2 oz (62.2 kg)   SpO2 100%   BMI 20.86 kg/m   Good weight gain noted. Lungs clear Cardiac RRR without m or g  ASSESSMENT/PLAN:   Chronic pain Wean off narcotics.  Add back gabapentin.  It will also be helpful to treat his depression.    Depression, recurrent (HCC) Worse.  Restart lexapro.  Also support disability application.  Cataract Ophthalmology referral placed.  Neuropathic pain Restart gabapentin.     , MD [facility]

## 2021-07-11 ENCOUNTER — Encounter: Payer: Self-pay | Admitting: Family Medicine

## 2021-07-11 NOTE — Assessment & Plan Note (Signed)
Worse.  Restart lexapro.  Also support disability application.

## 2021-07-11 NOTE — Assessment & Plan Note (Signed)
Ophthalmology referral placed.

## 2021-07-11 NOTE — Assessment & Plan Note (Signed)
Wean off narcotics.  Add back gabapentin.  It will also be helpful to treat his depression.

## 2021-07-11 NOTE — Assessment & Plan Note (Signed)
Restart gabapentin.

## 2021-08-02 ENCOUNTER — Other Ambulatory Visit: Payer: Self-pay | Admitting: *Deleted

## 2021-08-02 DIAGNOSIS — G8929 Other chronic pain: Secondary | ICD-10-CM

## 2021-08-02 MED ORDER — ESCITALOPRAM OXALATE 10 MG PO TABS: 10.0000 mg | ORAL_TABLET | Freq: Every day | ORAL | 3 refills | Status: DC

## 2021-08-02 NOTE — Telephone Encounter (Signed)
[name] called for patient and states that his lexapro wasn't ever received by the pharmacy after his visit 07-10-21. It appeared to be ordered as no print.  Medication pended and new pharmacy attached as they were having trouble with the previous one.  Will forward to MD.  Burnard Hawthorne

## 2021-10-12 ENCOUNTER — Telehealth: Payer: Self-pay

## 2021-10-12 DIAGNOSIS — G8929 Other chronic pain: Secondary | ICD-10-CM

## 2021-10-12 MED ORDER — ESCITALOPRAM OXALATE 10 MG PO TABS: 10.0000 mg | ORAL_TABLET | Freq: Every day | ORAL | 3 refills | Status: AC

## 2021-10-12 NOTE — Telephone Encounter (Signed)
Rx sent as requested.

## 2021-10-12 NOTE — Telephone Encounter (Signed)
[name] calls nurse line requesting pharmacy switch for Lexapro.   Patient wishes to no longer use [facility] and instead [facility].   Pharmacies have been updated in Epic.   Please send additional refills to [facility] as they will not transfer.

## 2022-03-02 IMAGING — DX DG ABDOMEN 1V
2 series · 2 of 2 positions shown · non-contrast
Comparison: 05/01/2019

CLINICAL DATA: Recent endoscopy, abdominal pain

EXAM:
ABDOMEN - 1 VIEW

[abdomen kub (1 of 2)]
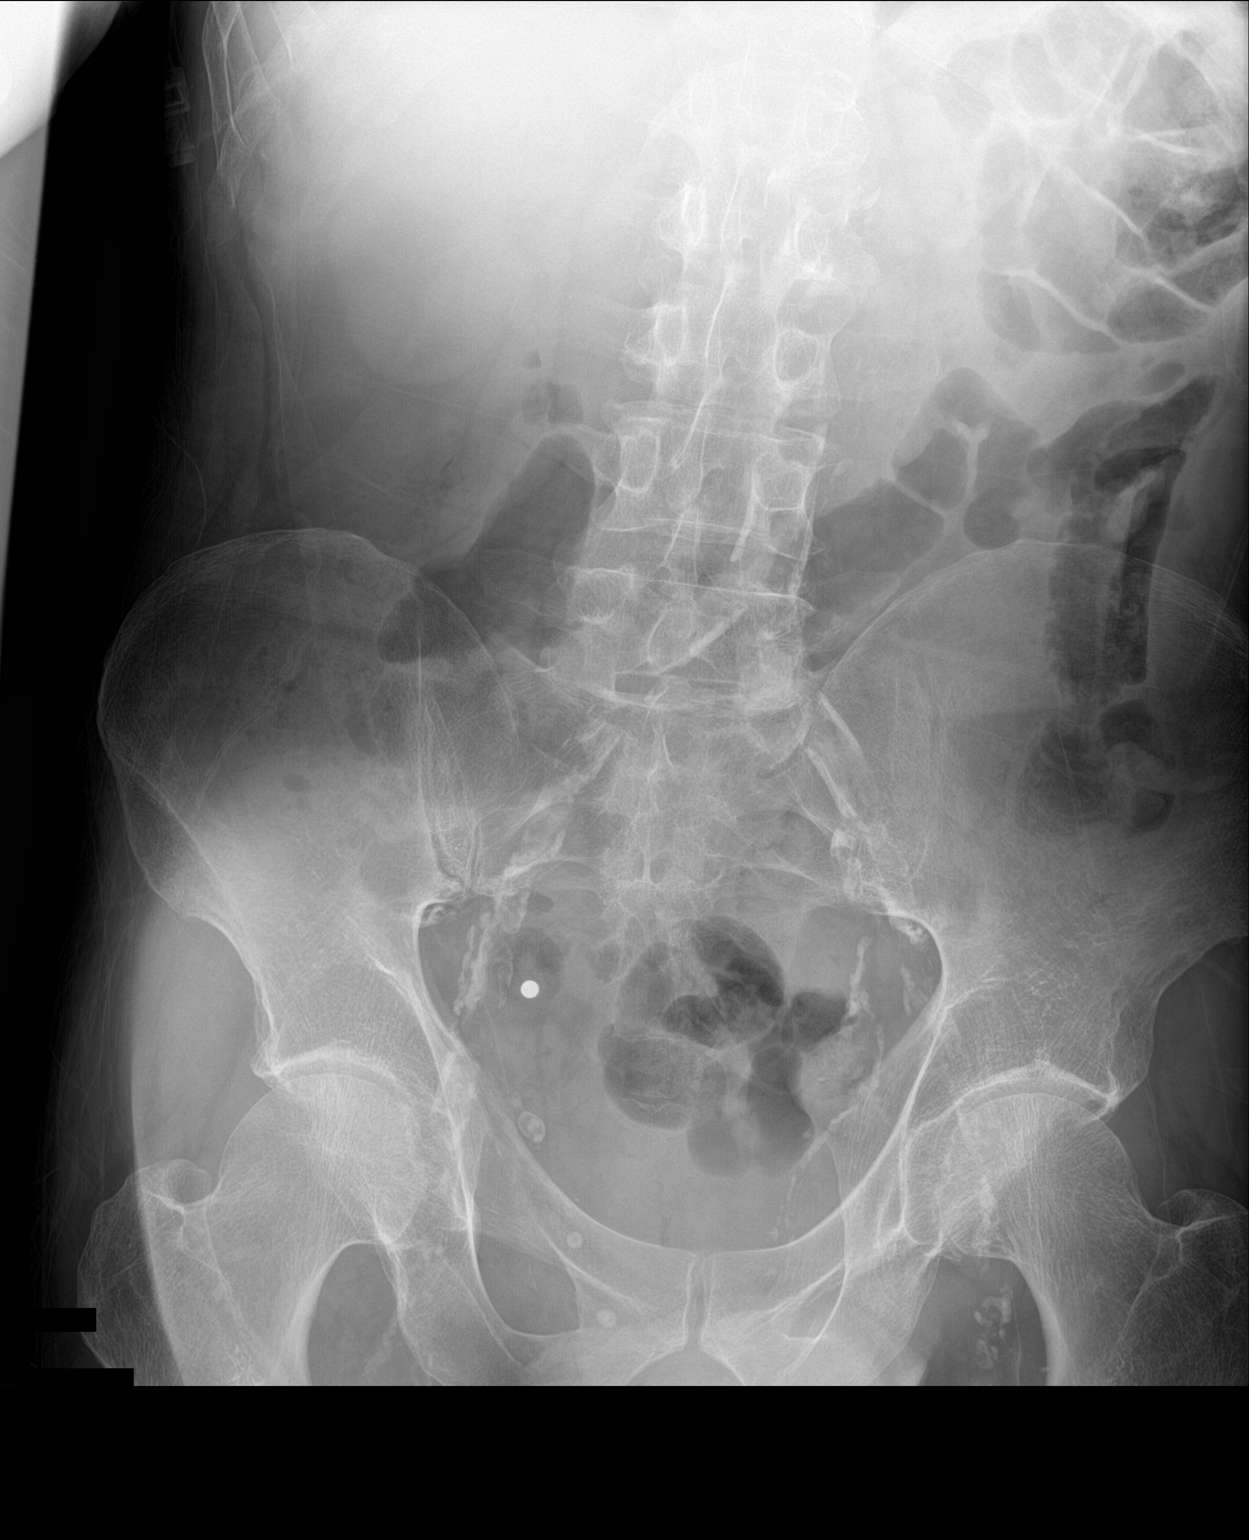

[abdomen kub (2 of 2)]
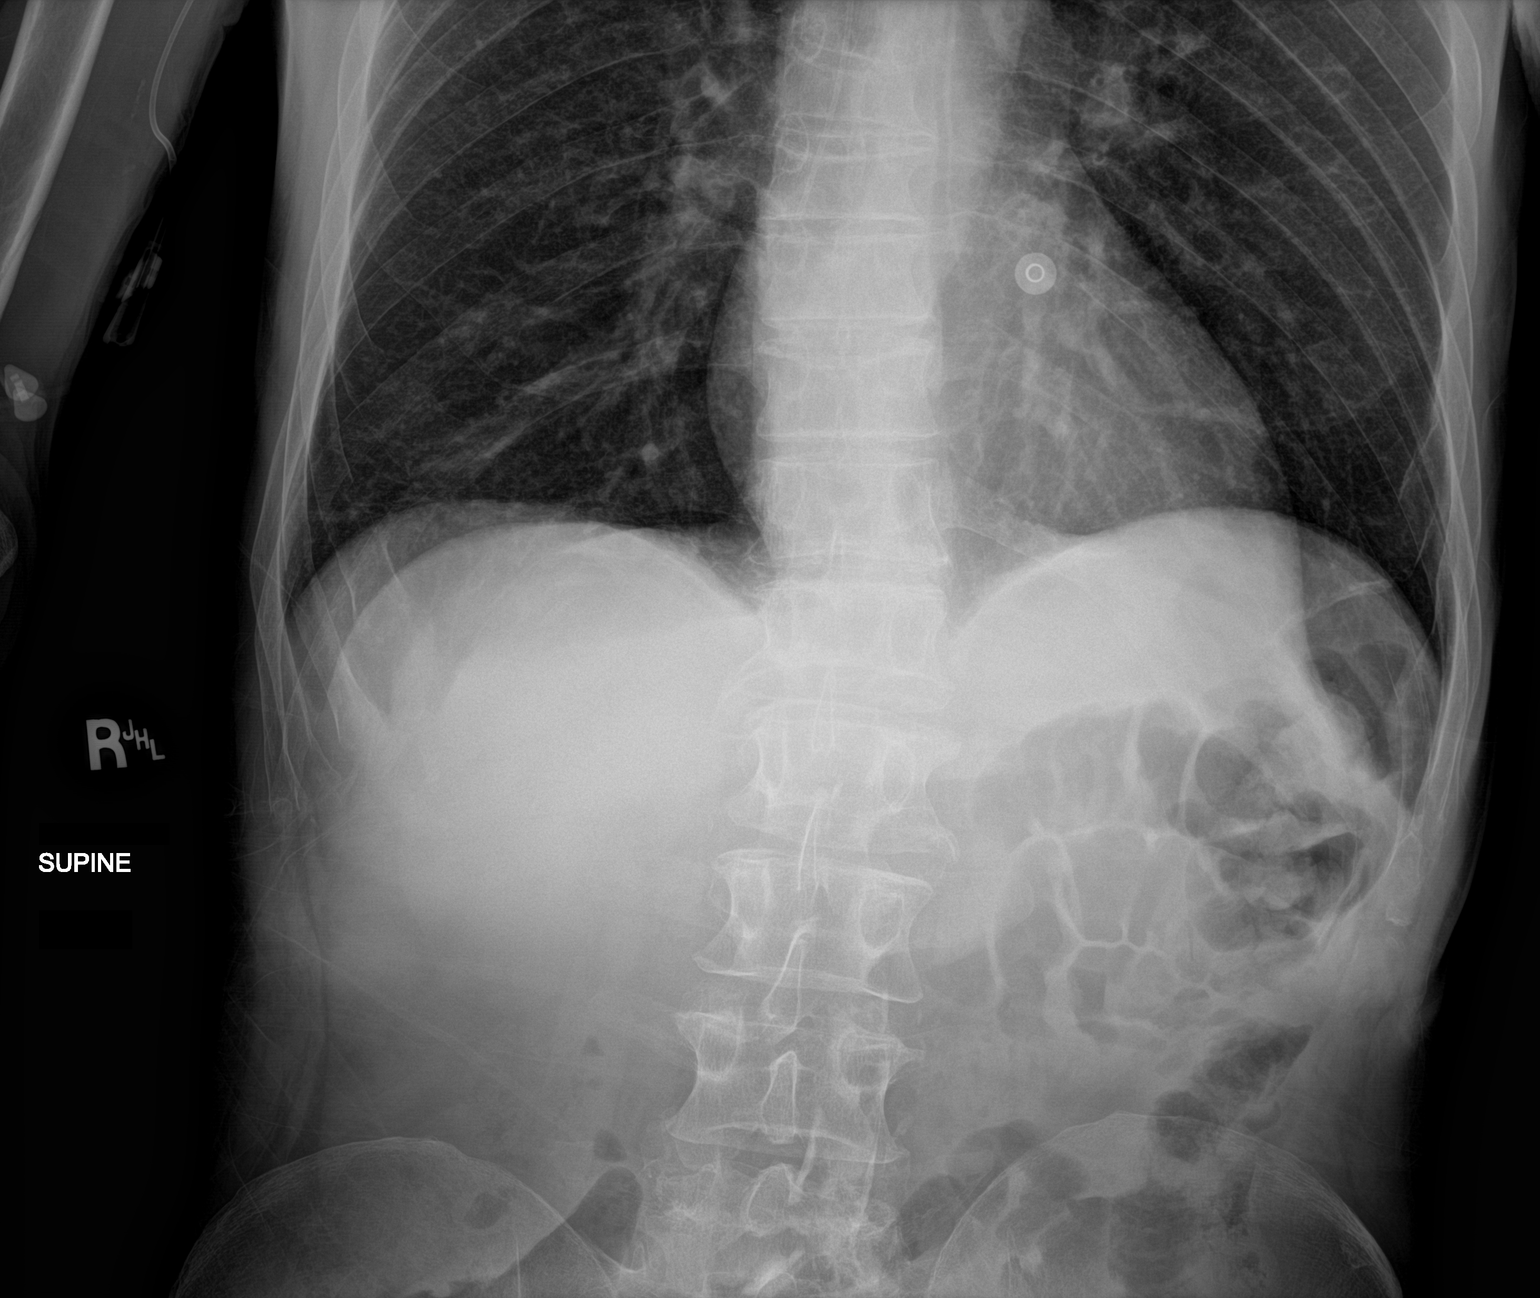

[2 of 2 positions shown; findings below may reference images not displayed]

FINDINGS: Two supine frontal views of the abdomen and pelvis are obtained. The
lung bases are clear. Bowel gas pattern is unremarkable without
obstruction or ileus. There are no masses or abnormal
calcifications. Retained metallic BB right lower quadrant unchanged.
No acute bony abnormalities. Extensive atherosclerosis.
IMPRESSION: 1. Unremarkable bowel gas pattern.

## 2022-03-13 ENCOUNTER — Encounter: Payer: Self-pay | Admitting: Family Medicine

## 2023-05-23 IMAGING — CR DG CHEST 2V
2 series · 2 of 2 positions shown · non-contrast
Comparison: Chest x-ray 05/29/2020.

CLINICAL DATA: 54-year-old male under preoperative evaluation prior
to esophagectomy.

EXAM:
CHEST - 2 VIEW

[w chest pa]
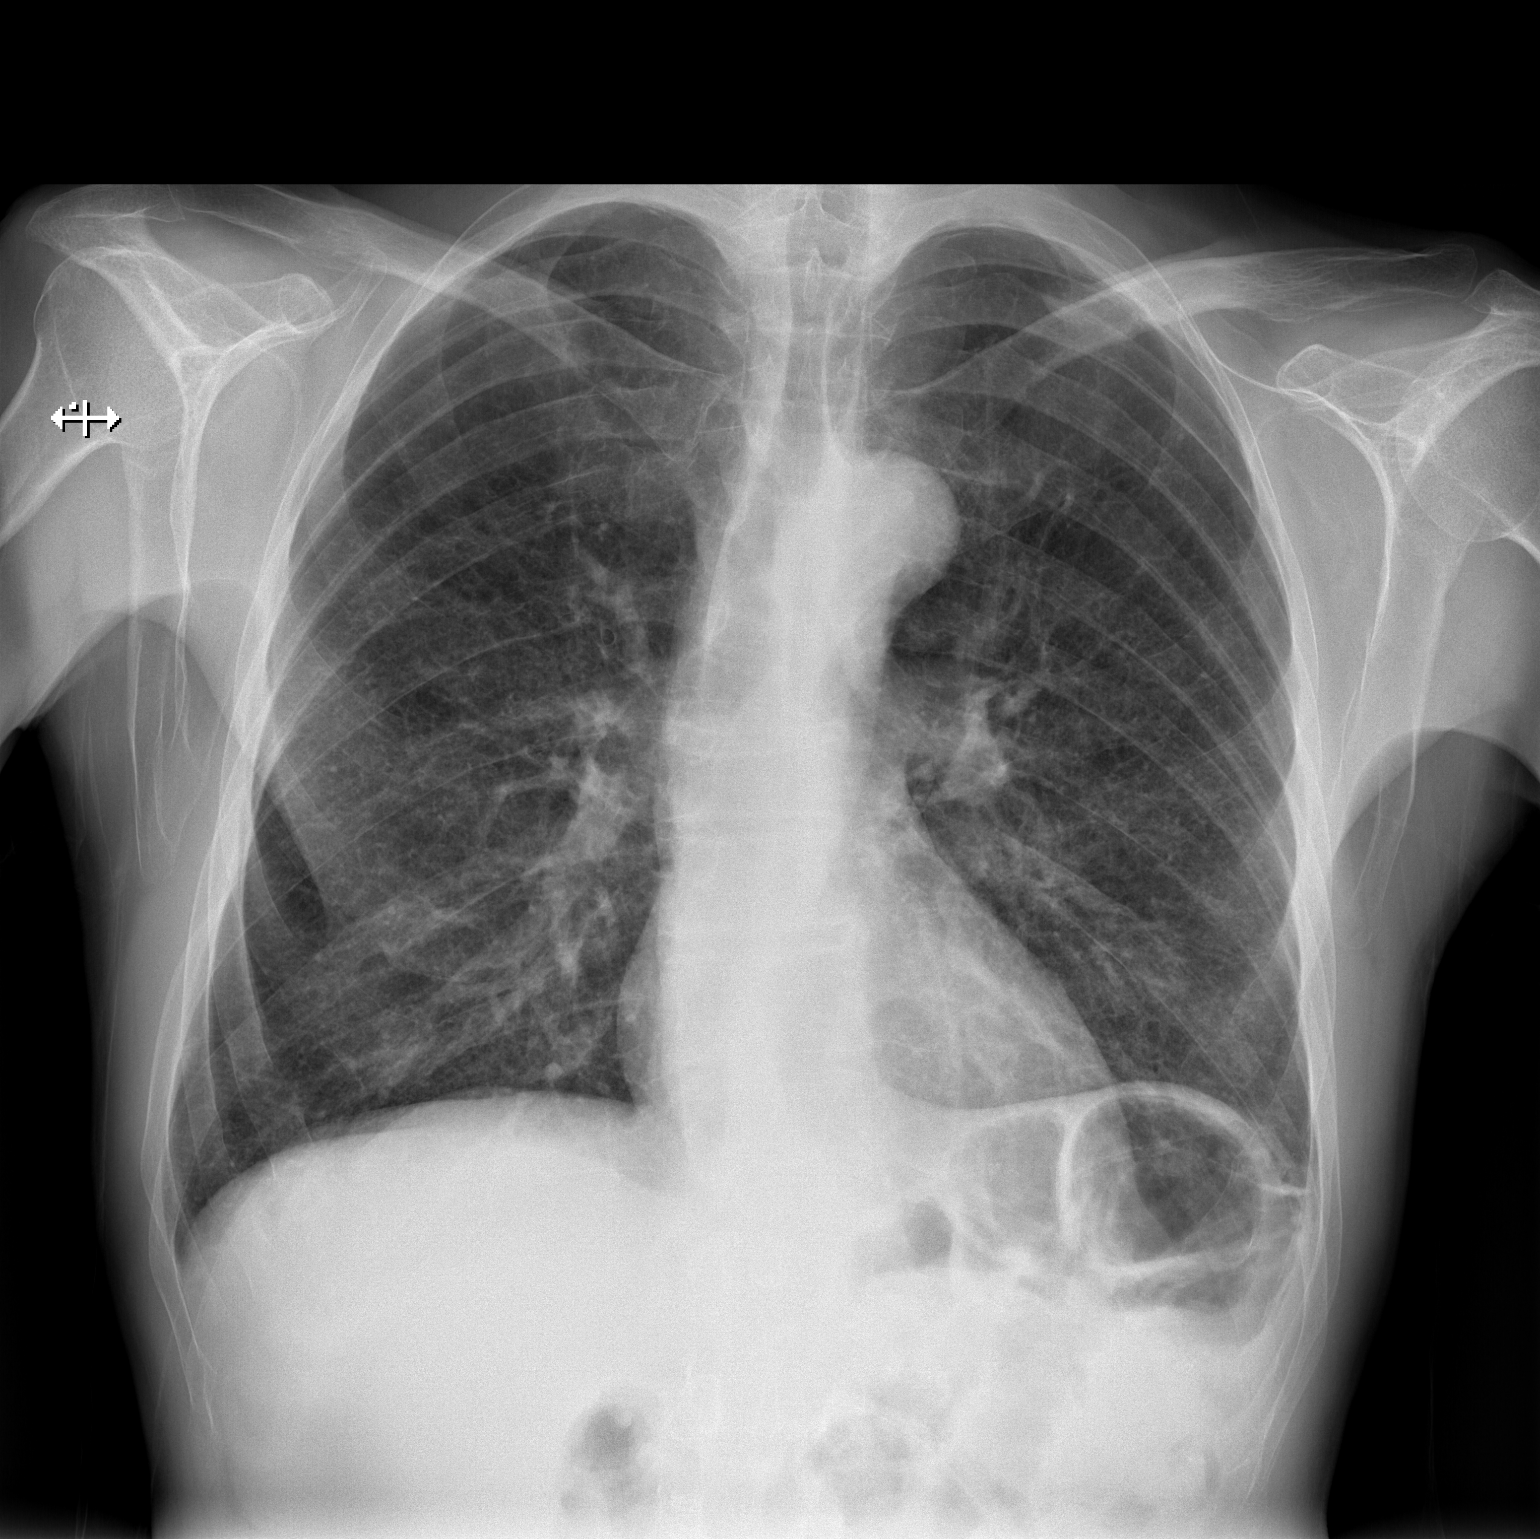

[w chest lat]
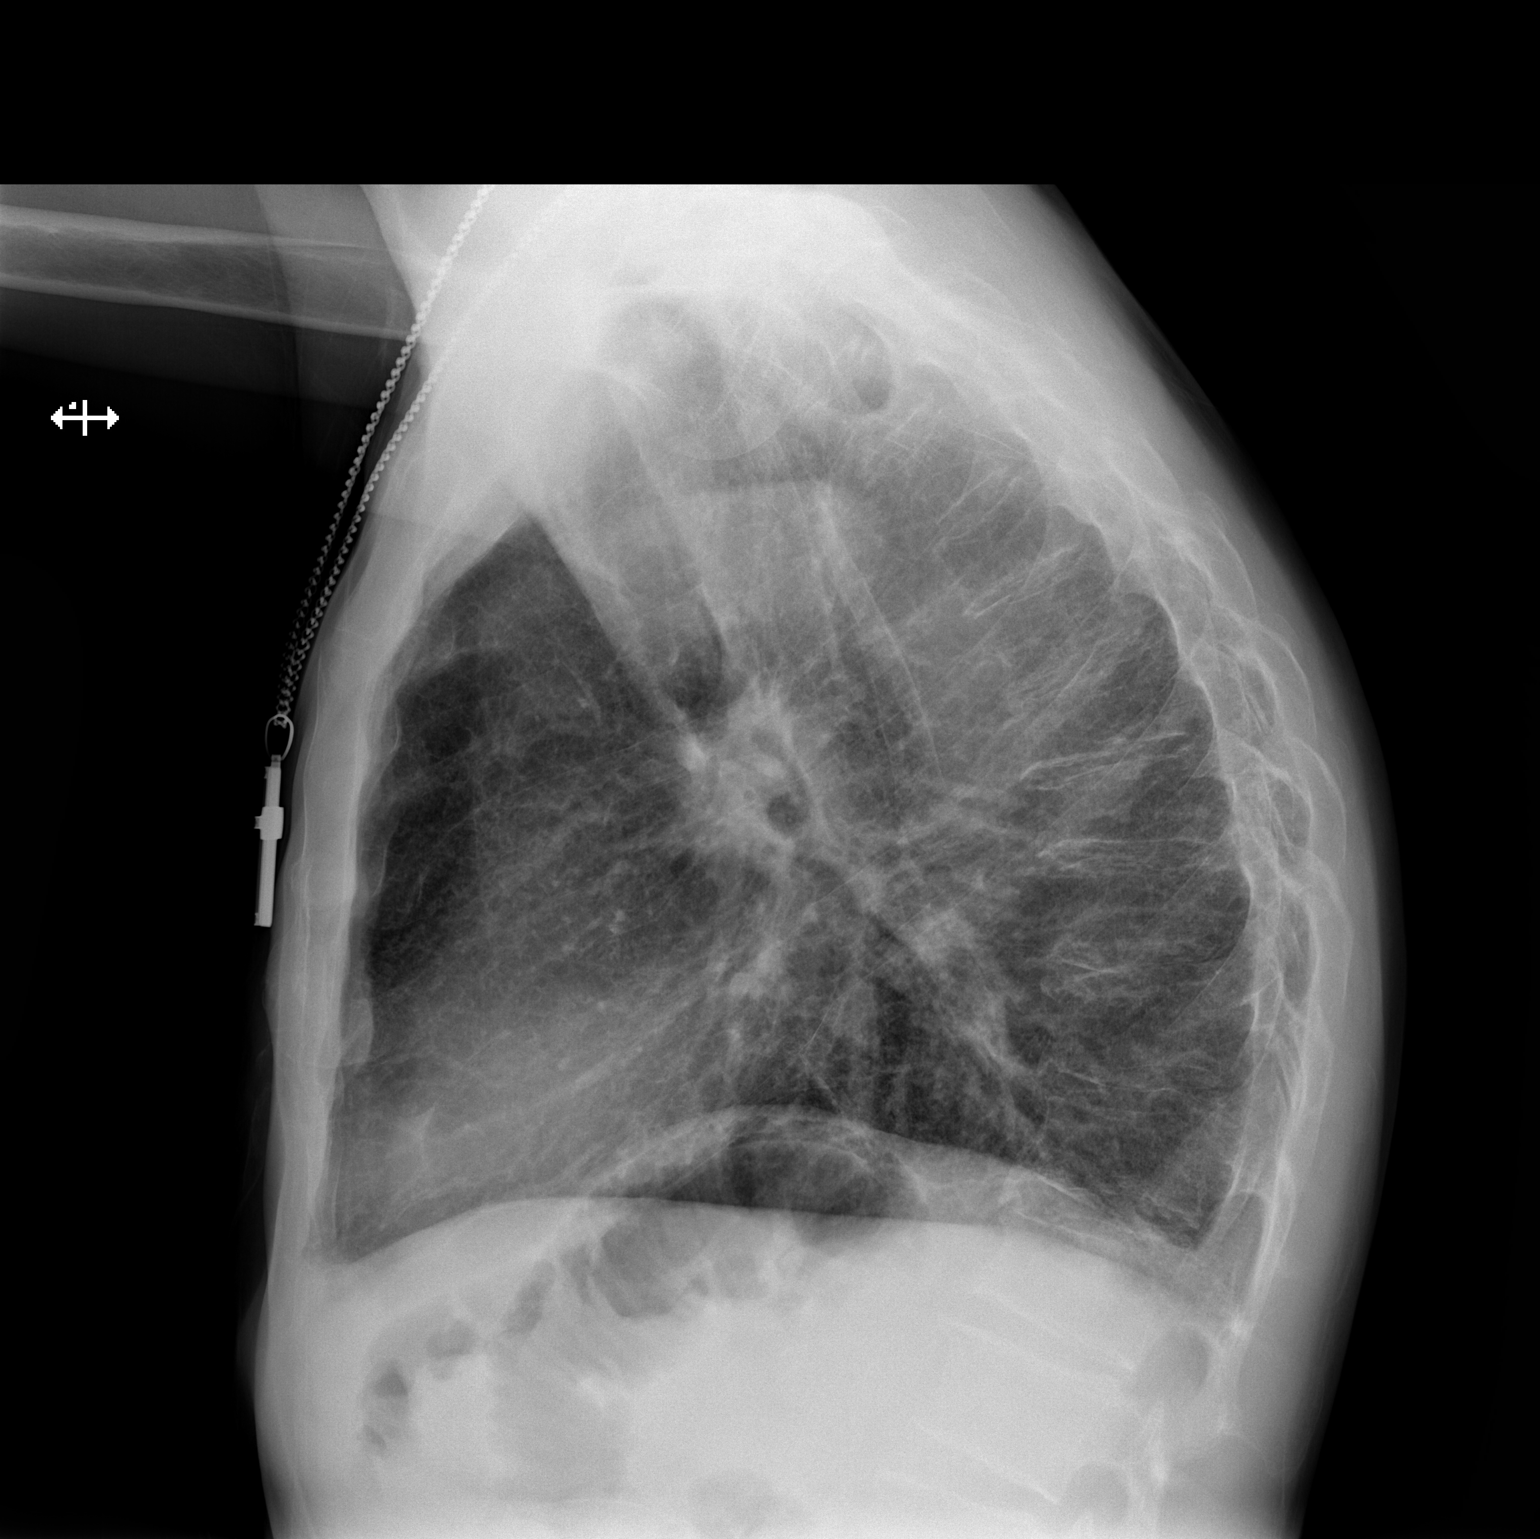

[2 of 2 positions shown; findings below may reference images not displayed]

FINDINGS: Lung volumes are normal. Mild emphysematous changes are noted.
Diffuse peribronchial cuffing. No consolidative airspace disease. No
pleural effusions. No pneumothorax. No pulmonary nodule or mass
noted. Pulmonary vasculature and the cardiomediastinal silhouette
are within normal limits. Atherosclerosis in the thoracic aorta.
IMPRESSION: 1. No radiographic evidence of acute cardiopulmonary disease.
2. Diffuse peribronchial cuffing and emphysematous changes; imaging
findings suggestive of underlying COPD.
3. Aortic atherosclerosis.

## 2023-06-06 IMAGING — CR DG CHEST 2V
2 series · 2 of 2 positions shown · non-contrast
Comparison: 08/16/2020.  05/23/2020.  CT 08/25/2019.

CLINICAL DATA: Preoperative exam.

EXAM:
CHEST - 2 VIEW

[w chest pa]
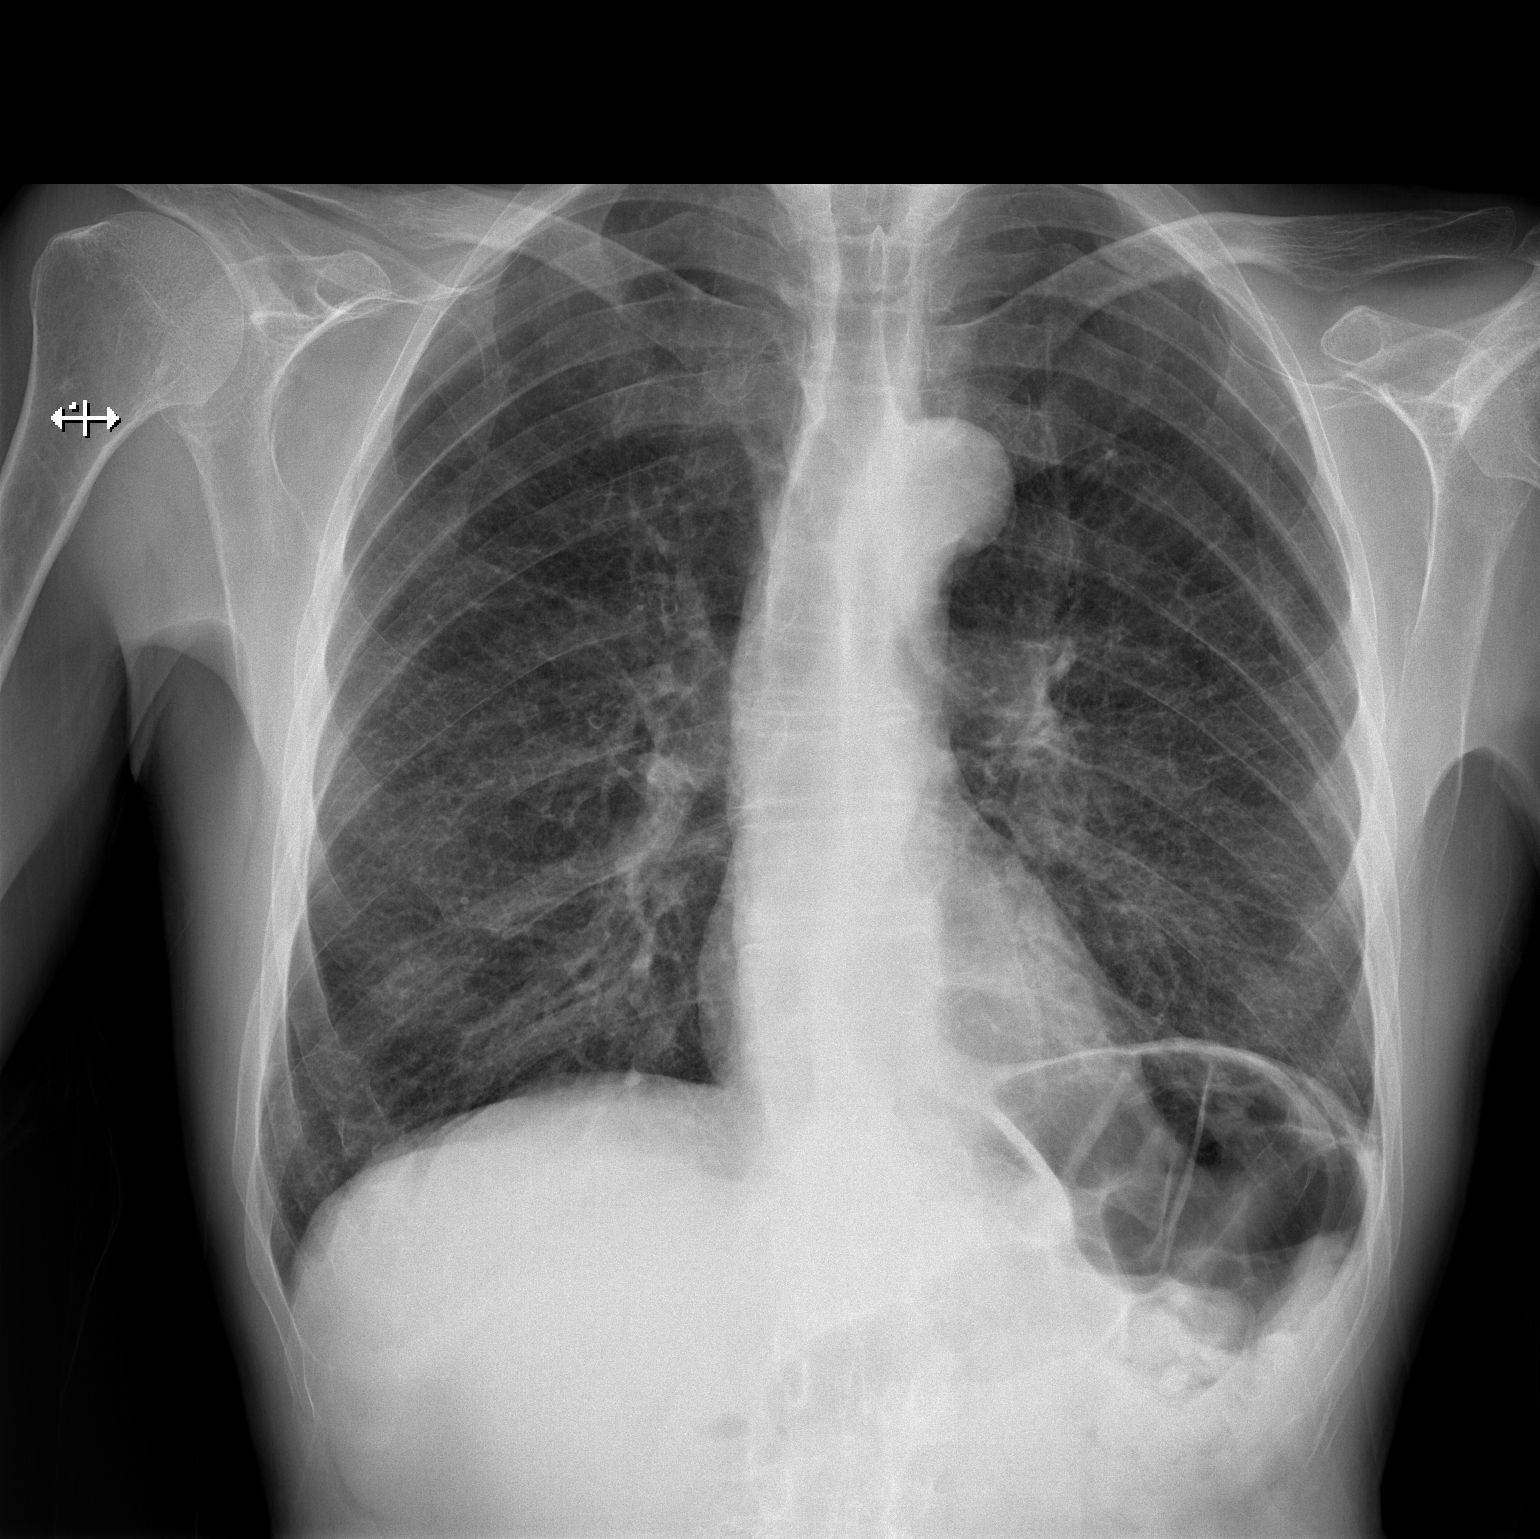

[w chest lat]
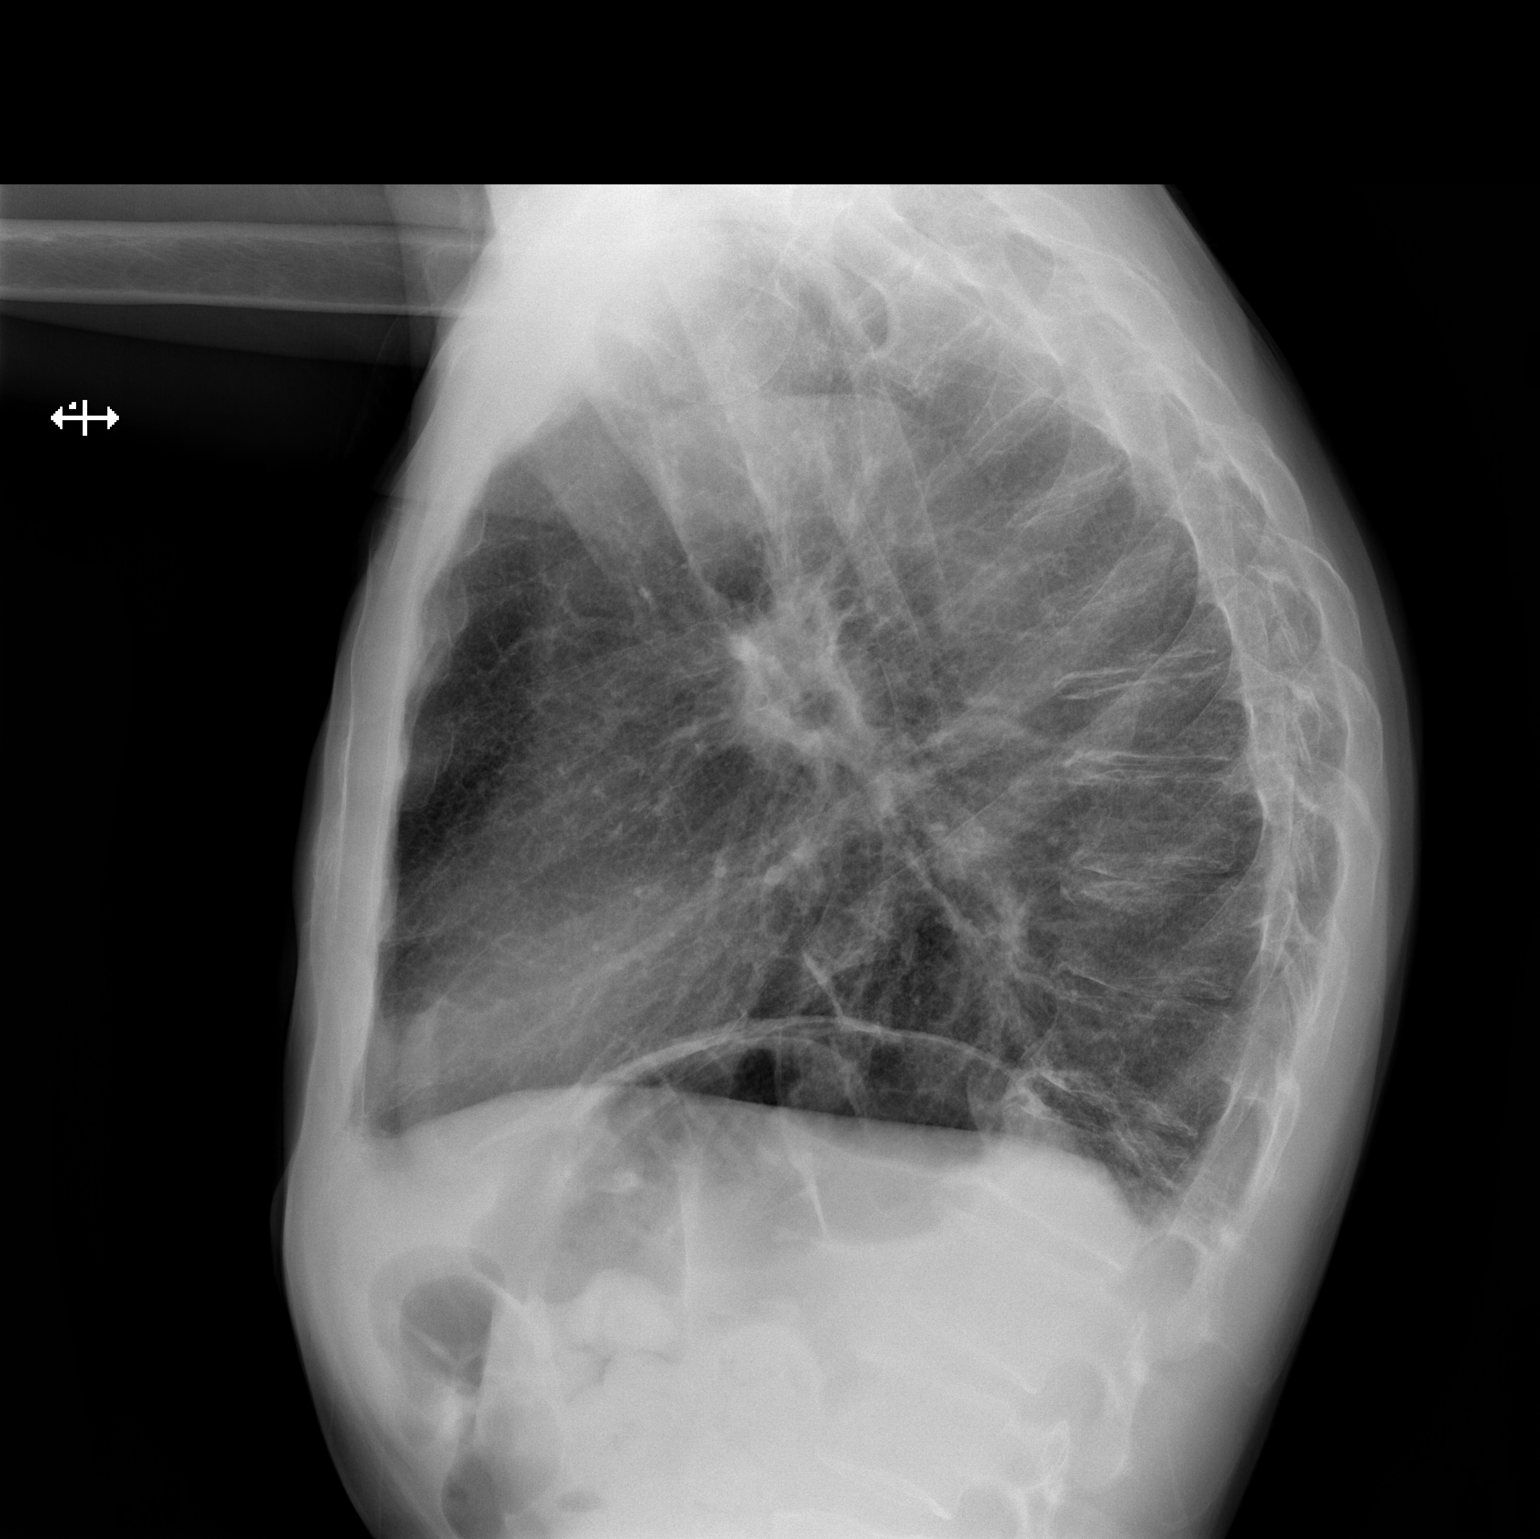

[2 of 2 positions shown; findings below may reference images not displayed]

FINDINGS: Mediastinum and hilar structures normal. Heart size normal. COPD. No
acute infiltrate. No pleural effusion or pneumothorax. Stable mild
elevation left hemidiaphragm. Degenerative change thoracic spine.
Abnormality.
IMPRESSION: COPD. Stable mild elevation left hemidiaphragm. No acute
cardiopulmonary disease.

## 2023-10-31 ENCOUNTER — Telehealth: Payer: Self-pay

## 2023-10-31 NOTE — Transitions of Care (Post Inpatient/ED Visit) (Signed)
   10/31/2023  Name: PACO CISLO MRN: [id number] DOB: 19-Jul-1965  Today's TOC FU Call Status: Today's TOC FU Call Status:: Unsuccessful Call (1st Attempt) Unsuccessful Call (1st Attempt) Date: 10/31/23  Attempted to reach the patient regarding the most recent Inpatient/ED visit.  Follow Up Plan: Additional outreach attempts will be made to reach the patient to complete the Transitions of Care (Post Inpatient/ED visit) call.   Shona Prow RN, CCM [place]  VBCI-Population Health RN Care Manager [phone]

## 2023-10-31 NOTE — Transitions of Care (Post Inpatient/ED Visit) (Signed)
   10/31/2023  Name: RIORDAN WALLE MRN: [id number] DOB: [birth year]  Today's TOC FU Call Status: Today's TOC FU Call Status:: Unsuccessful Call (2nd Attempt) Unsuccessful Call (2nd Attempt) Date: 10/31/23  Attempted to reach the patient regarding the most recent Inpatient/ED visit.  Follow Up Plan: Additional outreach attempts will be made to reach the patient to complete the Transitions of Care (Post Inpatient/ED visit) call.   No My Sticky Note on file.

## 2023-11-01 ENCOUNTER — Telehealth: Payer: Self-pay

## 2023-11-01 NOTE — Transitions of Care (Post Inpatient/ED Visit) (Signed)
   11/01/2023  Name: Ralph [name] MRN: [id number] DOB: 18-Sep-1965  Today's TOC FU Call Status: Today's TOC FU Call Status:: Unsuccessful Call (3rd Attempt) Unsuccessful Call (3rd Attempt) Date: 11/01/23  Attempted to reach the patient regarding the most recent Inpatient/ED visit.  Follow Up Plan: No further outreach attempts will be made at this time. We have been unable to contact the patient.  Shona Prow RN, CCM [place]  VBCI-Population Health RN Care Manager [phone]

## 2024-01-03 NOTE — Progress Notes (Signed)
 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  The principal diagnosis during this admission was esophageal stricture, with failure to thrive and severe protein-calorie malnutrition secondary to poor oral intake. 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 Pathology from the biopsies demonstrated inflamed reactive columnar mucosa with ulceration, acute and chronic inflammation, reactive fibrosis, and necroinflammatory slough, but no evidence of malignancy, CMV, H. pylori, or fungal organisms.  Given persistent failure to thrive and inability to maintain adequate nutrition orally, a multidisciplinary team including gastroenterology, emergency general surgery, interventional radiology, and nutrition recommended surgical placement of a jejunostomy tube due to prior surgical history and anatomical considerations. Laparoscopic jejunostomy tube placement was performed on 12/27/2023 without complications, and enteral nutrition was initiated and advanced per dietitian recommendations, with close monitoring for refeeding syndrome and electrolyte abnormalities.  During the admission, he developed aspiration pneumonia, evidenced  by new hypoxia, increased oxygen requirements, cough with yellow sputum, and imaging findings of diffuse interstitial and airspace opacities, most prominent in the lung bases, following tube placement and ongoing dysphagia. He was treated empirically with ceftriaxone and azithromycin, and his oxygen requirements were titrated down as his respiratory status improved.  Additional issues managed during the hospitalization included persistent electrolyte abnormalities (hypomagnesemia, hypophosphatemia, hyponatremia), which were monitored and repleted as needed, and ongoing severe malnutrition with muscle wasting, addressed through enteral feeding and supplementation with thiamine , multivitamins, and minerals. He also experienced leakage and skin breakdown around the jejunostomy tube site, which was managed with local wound care, adjustment of balloon volume, and frequent dressing changes per wound care and surgical recommendations.  His chronic conditions, including COPD and mood disorder, were monitored and managed with as-needed inhaled bronchodilators and continuation of home venlafaxine, respectively. Blood pressure remained stable, and home midodrine  was held during the admission. Speech and language pathology was consulted for ongoing dysphagia and aspiration risk; clinical and instrumental swallow evaluations were attempted, but the patient declined or was unavailable for some assessments, and diet recommendations were deferred to the primary team pending further evaluation.  By the end of the hospitalization, he was tolerating goal tube feeds, leakage around the tube had improved, and his respiratory status had stabilized on room air or low-flow oxygen. Discharge planning included arrangements for home health, tube feeding supplies, and outpatient nutrition follow-up for ongoing management of severe malnutrition and enteral feeding. Discharge Readiness:  Timeframe: 24-48H Dispo to: Home Barriers to  Discharge: Hyponatremia  Subjective   NAEON. VSS. Afebrile. RA. Sodium 125 -> 126 after starting salt tablets in the afternoon. Mild increase in CO2, although stable on RA. A&Ox4. Kidney function stable. CBC at baseline. Denies CP, SOB, abdominal pain, nausea, vomiting, fever, chills, HA, and confusion. Denies any concerns.   Assessment/Plan   Assessment & Plan Esophageal stricture FTT (failure to thrive) in adult Severe protein-calorie malnutrition (CMD) Dysphagia -  PMHx of esophagectomy with gastric-pull through in 2022, presented for OP EGD with dilation.  - Had dilation done (length of 2 cm and diameter of 10 mm in the upper 1/2 of the esophagus, dilated to 15 mm end size) previously on 10/25/23. GI recommended TF and repeat EGD in 4 weeks.  Continued to have difficulty with PO tolerance and weight loss.  - EGD done 11/19 with findings with large amount of food particles coating the larynx. Stricture at 25 cm along with a new possible mass around 40 cm at diaphragmatic impression. Stricture dilated to 12 mm with balloon with rent. Biopsies taken of neo-esophageal mass.  - Given failure to thrive, patient now agreeable to PEG. Discussed with both EGS and IR; laprascopic J tube placement by EGS on 11/21. PLAN: - SLP consulted - Patient declined  MBS on 11/24. Unable to be done on 11/26. Recommending dysphagia soft and bite sized diet with mildly thick liquids, meds ok whole in puree or via tube  - RD consulted  - Transitioned to cyclic feeds on 11/25, tolerating well per RD  - Free water  flushes at 130 mL q4hr  - Continue daily thiamine  - GI consulted  - Negative biopsies, no malignancy. Negative for fungal organisms, CMV or H.pylori.  - GI to arrange f/u in the OP setting  - EGS consulted  - Re-engated due to leaking around J tube site - Balloon decreased from 3 cc to 1.5 cc on 11/24 Aspiration pneumonia    (CMD) Hypoxia - The patient has evidence of ongoing pneumonia based on  images showing focal opacification  with WBC/mL greater than or equal to 12,000  and 2 or more additional findings of new onset/change in sputum character with increased respiratory secretions , new onset or worsening cough, or dyspnea, or tachycardia , and Increased oxygen requirements  - Reported worsening SOB and dizziness on 11/23. Reported to have O2 at 84-86% on RA, placed on 2L. RT called, patient was given an nebulizer. CXR noted for diffuse interstitial and airspace opacities bilaterally, favored to be aspiration PNA. Moderate-large hiatal hernia with adjacent atelectasias.  PLAN: - Aspiration precautions - s/p Rocephin and Azithro - Wean O2 as tolerated, currently down tto RA - PT consulted, recommended return home  Hypomagnesemia Hyponatremia Hypophosphatemia The patient has clinically significant hyponatremia with sodium:125* (11/26 1425) (sodium <130). The patient's electrolytes will be monitored closely and appropriately treated. 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 TSH and cortisol appropriate. Hx of chronic hyponatremia but worsened after transionted to cyclic feeds but spontaneously improved on their own w/o any intervention, but maintaining around 125-126. Per chart review, patient already on fluid restriction based off I/O.  - Continue salt tablets 1 gram TID, start NS 75 mL/hr x 1 day - BMP every 6 hours, STAT BMP in the setting of neurologic changes Mood disorder - Continue Effexor  COPD (chronic obstructive pulmonary disease) (CMD) - Continue Albuterol  PRN  Low blood pressure - BP stable, Continue to hold Midodrine    Nutrition Status: Severe protein-calorie malnutrition, in the context of Social or  environmental circumstances, has been identified by the Museum/gallery Exhibitions Officer. See last RD note for recommendations. Nutrition Intervention: General healthful diet, Enteral nutrition, Vitamin and/or mineral supplements  Agree with findings:  Yes Problem list updated: Yes     DVT Prophylaxis: Lovenox  SubQ  Active Medications: cholecalciferol, 400 Units, oral, Daily cyanocobalamin, 1,000 mcg, oral, Daily enoxaparin , 40 mg, subcutaneous, At Bedtime free water , 130 mL, J-tube, Q4H SCH gabapentin , 100 mg, oral, TID lidocaine , 1 patch, topical, Daily nicotine , 1 patch, transdermal, Daily Osmolite 1.5, , J-tube, Cyclic feed @ 1800 pantoprazole , 40 mg, intravenous, Q12H [facility] pediatric multivitamin-minerals, 1 mL, oral, Daily sodium chloride , 1 g, oral, TID with meals thiamine , 100 mg, intravenous, Daily venlafaxine, 37.5 mg, oral, Daily   Infusions: sodium chloride , 75 mL/hr, Last Rate: 75 mL/hr (01/03/24 0735)   Objective   Temp:  [97.5 F (36.4 C)-98.3 F (36.8 C)] 97.5 F (36.4 C) Heart Rate:  [76-95] 95 Resp:  [12-20] 19 BP: (116-120)/(71-84) 117/78 O2 Flow Rate (L/min): 2 L/min  Physical Exam Vitals reviewed.  Constitutional:      Appearance: He is cachectic. He is not ill-appearing or diaphoretic.  Cardiovascular:     Rate and Rhythm: Normal rate and regular rhythm.  Pulmonary:     Effort: Pulmonary effort is normal.     Breath sounds: Normal breath sounds.  Abdominal:     Palpations: Abdomen is soft.     Tenderness: There is no abdominal tenderness.     Comments: J tube dressing with mild strike through, per nursing continues to refuse dressing changes  Musculoskeletal:     Right lower leg: No edema.     Left lower leg: No edema.  Skin:    General: Skin is warm.     Capillary Refill: Capillary refill takes less than 2 seconds.  Neurological:     General: No focal deficit present.     Mental Status: He is alert.  Psychiatric:        Mood and Affect: Mood  normal.      Labs   Results from last 2 days  Lab Units 01/03/24 0422 01/02/24 0451  WHITE BLOOD CELL COUNT 10*3/uL 6.70 8.20  HEMOGLOBIN g/dL 87.2* 87.0*  HEMATOCRIT % 37.3* 38.4*  PLATELET COUNT 10*3/uL 328 268    Results from last 2 days  Lab Units 01/03/24 0422 01/02/24 2308  SODIUM mmol/L 126* 126*  POTASSIUM mmol/L 5.0 4.7  CHLORIDE mmol/L 94* 94*  CO2 mmol/L 34* 32*  BUN mg/dL 10 9  CREATININE mg/dL 9.74* 9.72*  GLUCOSE mg/dL 93 73  CALCIUM  mg/dL 8.1* 8.5*    Results from last 2 days  Lab Units 01/03/24 0422 01/02/24 0451  MAGNESIUM  mg/dL 1.9 2.1      Marry Rumalda Sheng, PA-C

## 2024-01-03 NOTE — Progress Notes (Signed)
 Case Management Update  Date: 01/03/2024   Time: 3:45 PM   Patient Type: Inpatient  Patient continues IVF for hyponatremia. GJ tube in place with nightly cyclic feedings.  CM contacted Flavia with [facility] [phone]) who has accepted referral and was in today to do teaching, but patient states he's not feeling well. He relates he has done cyclic tube feedings before and feels comfortable with the process.         Case Management Coordination Status: Coordination Complete    Anticipated Discharge Location: Home with Durable Medical Equipment  If Plan A discharging location is not feasible: Potential Plan B: Home with Durable Medical Equipment  Durable Medical Equipment Coordination Status: Coordination complete.     Channing Jenkins Molt, RN   Magdalena Molt BETHANN OBIE Nurse Clinical Coordinator [phone]

## 2024-01-04 NOTE — Telephone Encounter (Signed)
 Referring provider: Tom  Medic visit 12-2 for BMP  Rosaline Roys, BSN, RN Nurse [facility])  Secure Chat Group: [facility] Telehospitalist  [phone]

## 2024-01-04 NOTE — Nursing Note (Signed)
 Educated patient on discharge instructions and wound care. Wound care instructions also given to patient's Wife, all personal belongings returned. Pt stable, transported to personal vehicle via transport, discharge home with spouse.

## 2024-01-06 ENCOUNTER — Other Ambulatory Visit: Payer: Self-pay

## 2024-01-06 NOTE — Transitions of Care (Post Inpatient/ED Visit) (Signed)
   01/06/2024  Name: DAVIER TRAMELL MRN: [id number] DOB: [birth year]  Today's TOC FU Call Status: Today's TOC FU Call Status:: Unsuccessful Call (1st Attempt) Unsuccessful Call (1st Attempt) Date: 01/06/24  Attempted to reach the patient regarding the most recent Inpatient/ED visit.  Unsuccessful outreach attempt by telephone. Confidential voice mail left with RN CM call back number.   Follow Up Plan: Additional outreach attempts will be made to reach the patient to complete the Transitions of Care (Post Inpatient/ED visit) call.    Bing Edison MSN, RN RN [facility] [employer] [facility]  VBCI-Population Health Office Hours M-F [phone] Direct Dial: [phone] Main Phone [phone]  Fax: [phone] [email or web address]

## 2024-01-07 ENCOUNTER — Telehealth: Payer: Self-pay | Admitting: *Deleted

## 2024-01-07 NOTE — Transitions of Care (Post Inpatient/ED Visit) (Signed)
   01/07/2024  Name: Ralph [name] MRN: [id number] DOB: Dec 17, 1965  Today's TOC FU Call Status: Today's TOC FU Call Status:: Unsuccessful Call (2nd Attempt) Unsuccessful Call (2nd Attempt) Date: 01/07/24  Attempted to reach the patient regarding the most recent Inpatient/ED visit.  Follow Up Plan: Additional outreach attempts will be made to reach the patient to complete the Transitions of Care (Post Inpatient/ED visit) call.   Cathlean Headland BSN RN [place] [facility] Health Care Management Coordinator [email or web address] Direct Dial: [phone]  Fax: [phone] Website: [email or web address]

## 2024-01-09 ENCOUNTER — Telehealth: Payer: Self-pay

## 2024-01-09 NOTE — Transitions of Care (Post Inpatient/ED Visit) (Signed)
   01/09/2024  Name: Ralph [name] MRN: [id number] DOB: [birth year]  Today's TOC FU Call Status: Today's TOC FU Call Status:: Unsuccessful Call (3rd Attempt) Unsuccessful Call (3rd Attempt) Date: 01/09/24  Attempted to reach the patient regarding the most recent Inpatient/ED visit.  Follow Up Plan: No further outreach attempts will be made at this time. We have been unable to contact the patient.   Bing Edison MSN, RN RN [employer] [facility]  [facility]Population Health Office Hours M-F [phone] Direct Dial: [phone] Main Phone [phone]  Fax: [phone] [email or web address]

## 2024-01-15 NOTE — Progress Notes (Signed)
 Case Management Discharge Note        CSN: [id number] DOB: [birth year] Service: General Medicine Location: N827/A  Patient Class: Inpatient  DC Disposition: : Home or Self Care  Discharge DC Disposition: : Home or Self Care Discharge Transport: Ambulance Discharge Transport Agency Chosen: [employer] [phone])  Discharge Referrals Case closed, patient/family agree with disposition plan: Yes  Pt was dc home today. Pt's wife met with Nadean with [facility] for tube feeding f/u. This worker verified with RD that the dc  tube feed orders listed in 01/10/24 RD consult note were the tube feed orders for dc. Sw notified Nadean with [facility] of this. Sw had dr sign orders for tube feeds per [facility] request. Remer reports that pt and pt's wife demonstrated how to adminster tube feeds correctly. She reports that pt already has formula and supplies at home. Sw scheduled [facility] for this pm. [facility] transported pt home at 4:30 pm. Sw verified with pt's wife that address listed in the system was [place].  No request for further intervention. Case closed.     Case Management Coordination Status: Coordination Complete     Mliss Kitty, MSW

## 2024-01-15 NOTE — Discharge Summary (Signed)
 Discharge Summary   PATIENT NAME:  Ralph [name] DOB:  Aug 15, 1965 MRN:  [id number]   Admission Date:   01/09/2024  2:28 PM                      Attending: Ankur Barnett Blanch, MD   Discharge Date:   01/15/2024                 Consultants: IP CONSULT TO NUTRITION SERVICES WOUND OSTOMY EVAL AND TREAT    DISCHARGE DIAGNOSIS: Hypovolemia Hypotension Oropharyngeal dysphagia Failure to thrive Hyponatremia from poor solute intake SIADH Hypomagnesemia High risk of refeeding syndrome Severe protein calorie malnutrition present upon arrival History of mood disorder History of COPD not in exacerbation Chronic low back pain Ongoing nicotine  use History of esophageal stricture status post esophagectomy with gastric pull-through  Hospital Course: Marcell Chavarin is a 58 year old male with a history of 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  On admission, he was found to be hypotensive, hypoxic, and cachectic, with laboratory evidence of hyponatremia, hypomagnesemia, and elevated lactic acid. Imaging revealed persistent left lung opacities, and physical exam was notable for significant muscle wasting and ongoing leakage around his jejunostomy tube site. He had not received adequate education on J-tube management prior to discharge, resulting in poor enteral intake and worsening malnutrition.  Management included intravenous fluids for hypovolemia, initiation and titration of tube feeds via J-tube, and repletion of electrolytes. Midodrine  was restarted to support blood pressure, and salt tablets were resumed for hyponatremia. He received IV thiamine  and  multivitamin supplementation, and nutrition consults guided enteral feeding advancement and monitoring for refeeding syndrome. Wound care addressed J-tube site irritation with topical agents and dressing changes, though the patient intermittently refused dressing changes and IV access later in the admission.  Throughout the hospitalization, his blood pressure stabilized, and he tolerated tube feeds, though he remained significantly cachectic and frail. Discharge planning was delayed due to the need for coordinated J-tube education for both the patient and his wife, with case management and the tube feed company arranging training prior to discharge.  Secondary issues addressed during admission included chronic pain managed with oxycodone  and lidocaine  patch, mood disorder managed with venlafaxine, COPD managed with albuterol  as needed, and ongoing risk for aspiration and oropharyngeal dysphagia, with a soft and mildly thickened diet as recommended by speech therapy. He received DVT prophylaxis with subcutaneous heparin. He also experienced intermittent hypomagnesemia and hyponatremia, which were monitored and repleted as needed. The patient intermittently refused blood sugar checks and dressing changes, and at times declined IV access, but remained hemodynamically stable at the time of last assessment.  At the time of the most recent note, he was medically stable and had completion of tube feed education for discharge home with durable medical equipment.   Physical Exam Vitals:   01/14/24 2225 01/14/24 2314 01/15/24 0443 01/15/24 0730  BP:  (!) 128/90  120/78  BP Location:  Left arm  Right arm  Patient Position:  Lying  Lying  Pulse:  62  64  Resp: 17 16 17 15   Temp:  98.2 F (36.8 C)  98.6 F (37 C)  TempSrc:  Oral  Oral  SpO2:  95%  94%  Weight:  General: Alert, oriented X 4, no apparent distress.  Frail and cachectic appearing  Head-Eyes-Ears-Nose-Throat: EOMI, pupils equal, round  and reactive to light,  oropharynx clear, no LAD.  Cardiovascular: Regular rate and rhythm, s1, s2, no murmurs.  Chest/Lungs: Clear to auscultation bilaterally, no wheezes or rhonchi.  Abdomen: Positive bowel sounds. Abdomen soft, nondistended, nontender. No HSM, rebound or guarding.  Extremities: 2+ peripheral pulses    Discharge Medications:   Discharge Medications     New Medications      Sig Disp Refill Start End  midodrine  10 mg tablet Commonly known as: PROAMATINE   Take 1 tablet by mouth in the morning and 1 tablet at noon and 1 tablet in the evening. Take with meals.   0         Modified Medications      Sig Disp Refill Start End  lidocaine  4 % patch Commonly known as: SALONPAS What changed: additional instructions  Place 2 patches onto the skin daily. Patch may remain in place for up to 12 hours in a 24-hour period. (Apply 2 patches topically daily.)  60 patch  2  January 16, 2024        Medications To Continue      Sig Disp Refill Start End  cholecalciferol 10 mcg/mL (400 unit/mL) Drop Commonly known as: VITAMIN D3  Take 1 mL (400 Units total) by mouth daily.  50 mL  0     cyanocobalamin 1,000 mcg tablet Commonly known as: VITAMIN B12  Take 1 tablet (1,000 mcg total) by mouth daily.  90 tablet  0     esomeprazole 40 mg DR capsule Commonly known as: NexIUM  Take 1 capsule (40 mg total) by mouth every morning before breakfast.  90 capsule  0     magnesium  oxide 400 mg (241 mg magnesium ) Tab  Take 1 tablet (400 mg total) by mouth daily.  30 tablet  0     miconazole nitrate 2 % Oint ointment  Apply 1 Application topically 3 (three) times a day.  30 g  1     naloxone  4 mg/actuation Spry nasal spray Commonly known as: Narcan   For suspected opioid overdose, call 911 and give a single spray into ONE nostril. If no or minimal response after 3 minutes, give another spray in OTHER nostril. (Administer 1 spray into affected nostril(s) as needed  for opioid reversal. Spray the contents of 1 device into 1 nostril. Call 911. May repeat with 2nd device in alternate nostril if no response in 2-3 minutes.)  2 each  0     oxyCODONE  10 mg Tab Commonly known as: ROXICODONE   Take 1 tablet (10 mg total) by mouth every 6 (six) hours as needed for moderate pain or severe pain. Max daily dose: 4 tablet (Take 1 tablet (10 mg total) by mouth every 6 (six) hours as needed for moderate pain (4-6) or severe pain (7-10). max daily dose: 4 tablet)   0     polyethylene glycol 17 gram packet Commonly known as: GLYCOLAX   Take 17 g by mouth daily as needed for constipation.   0     sodium chloride  1 gram tablet  Take 1 tablet (1 g total) by mouth in the morning and 1 tablet (1 g total) at noon and 1 tablet (1 g total) in the evening. Take with meals.  30 tablet  0     thiamine  100 mg tablet Commonly known as: VITAMIN B1  Take 1 tablet (100 mg  total) by mouth daily.  90 tablet  0     venlafaxine 37.5 mg tablet Commonly known as: EFFEXOR  Take 1 tablet by mouth daily.   0     Ventolin  HFA 90 mcg/actuation inhaler Generic drug: albuterol  HFA  Inhale 2 puffs 2 (two) times a day.   0         Stopped Medications    gabapentin  100 mg capsule Commonly known as: NEURONTIN    pediatric multivitamin-minerals liquid Commonly known as: MVW COMPLETE         Follow Up Appointments: Future Appointments  Date Time Provider Department Center  02/17/2024  2:45 PM Hendrick Larence Oliva, MD [facility] [place]    Condition on Discharge: Stable Discharge Disposition: Home   Discharge Time: 32 mins.  Signed Electronically:   Mitchael Blanch, MD 1:29 PM  01/15/2024  XR Chest 1 View  Final Result by Simpson Ronold Rising, MD (12/04 1609)  XR CHEST 1 VIEW, 01/09/2024 3:27 PM    INDICATION:Hypotension, hypoxia   COMPARISON: Chest x-ray 12/29/2023    FINDINGS:     Supportive devices: None  Cardiovascular/lungs/pleura: Cardiac  silhouette and pulmonary vasculature   are within normal limits. Redemonstrated large hiatal hernia. Improving   patchy airspace opacities throughout both lungs, with residual patchy   opacities most pronounced in the left midlung and left lung base. No   pleural effusion or pneumothorax. Coarse interstitial markings.  Other: No interval osseous abnormalities.    IMPRESSION:  Improving aeration the lungs with persistent left mid lung and left lung   base opacities.           Recent Results (from the past 72 hours)  POC Glucose   Collection Time: 01/12/24  5:01 PM  Result Value Ref Range   Glucose, POC 126 (H) 70 - 99 mg/dL  Urinalysis with Reflex to Microscopic   Collection Time: 01/12/24  9:31 PM  Result Value Ref Range   Color, Urine Yellow Yellow   Clarity, Urine Clear Clear   Specific Gravity, Urine 1.016 1.005 - 1.025   pH, Urine 8.5 (H) 5.0 - 8.0   Protein, Urine Negative Negative mg/dL   Glucose, Urine Negative Negative mg/dL   Ketones, Urine Negative Negative mg/dL   Bilirubin, Urine Negative Negative   Blood, Urine Negative Negative   Nitrite, Urine Negative Negative   Leukocyte Esterase, Urine Negative Negative   Urobilinogen, Urine 2.0 (A) <2.0 mg/dL  POC Glucose   Collection Time: 01/12/24 11:19 PM  Result Value Ref Range   Glucose, POC 98 70 - 99 mg/dL  Basic Metabolic Panel   Collection Time: 01/13/24  3:44 AM  Result Value Ref Range   Sodium 128 (L) 136 - 145 mmol/L   Potassium 4.6 3.5 - 5.1 mmol/L   Chloride 95 (L) 98 - 107 mmol/L   CO2 28 21 - 31 mmol/L   Anion Gap 5 (L) 6 - 14 mmol/L   Glucose, Random 84 70 - 99 mg/dL   Blood Urea Nitrogen (BUN) 9 7 - 25 mg/dL   Creatinine 9.59 (L) 9.29 - 1.30 mg/dL   eGFR >09 >40 fO/fpw/8.26f7   Calcium  8.1 (L) 8.6 - 10.3 mg/dL   BUN/Creatinine Ratio 22.5 (H) 10.0 - 20.0  Magnesium    Collection Time: 01/13/24  3:44 AM  Result Value Ref Range   Magnesium  1.6 (L) 1.9 - 2.7 mg/dL  Phosphorus   Collection Time:  01/13/24  3:44 AM  Result Value Ref Range   Phosphorus  3.7 2.5 - 5.0 mg/dL  CBC without Differential   Collection Time: 01/13/24  3:44 AM  Result Value Ref Range   WBC 4.60 4.40 - 11.00 10*3/uL   RBC 3.47 (L) 4.50 - 5.90 10*6/uL   Hemoglobin 10.8 (L) 14.0 - 17.5 g/dL   Hematocrit 68.3 (L) 58.4 - 50.4 %   Mean Corpuscular Volume (MCV) 90.9 80.0 - 96.0 fL   Mean Corpuscular Hemoglobin (MCH) 31.0 27.5 - 33.2 pg   Mean Corpuscular Hemoglobin Conc (MCHC) 34.1 33.0 - 37.0 g/dL   Red Cell Distribution Width (RDW) 14.3 12.3 - 17.0 %   Platelet Count (PLT) 317 150 - 450 10*3/uL   Mean Platelet Volume (MPV) 8.2 6.8 - 10.2 fL  POC Glucose   Collection Time: 01/13/24  5:37 AM  Result Value Ref Range   Glucose, POC 88 70 - 99 mg/dL  POC Glucose   Collection Time: 01/13/24 11:09 AM  Result Value Ref Range   Glucose, POC 108 (H) 70 - 99 mg/dL  POC Glucose   Collection Time: 01/13/24 11:52 PM  Result Value Ref Range   Glucose, POC 102 (H) 70 - 99 mg/dL  Basic Metabolic Panel   Collection Time: 01/14/24  3:43 AM  Result Value Ref Range   Sodium 129 (L) 136 - 145 mmol/L   Potassium 4.7 3.5 - 5.1 mmol/L   Chloride 95 (L) 98 - 107 mmol/L   CO2 28 21 - 31 mmol/L   Anion Gap 6 6 - 14 mmol/L   Glucose, Random 86 70 - 99 mg/dL   Blood Urea Nitrogen (BUN) 11 7 - 25 mg/dL   Creatinine 9.68 (L) 9.29 - 1.30 mg/dL   eGFR >09 >40 fO/fpw/8.26f7   Calcium  8.4 (L) 8.6 - 10.3 mg/dL   BUN/Creatinine Ratio 35.5 (H) 10.0 - 20.0  Magnesium    Collection Time: 01/14/24  3:43 AM  Result Value Ref Range   Magnesium  1.9 1.9 - 2.7 mg/dL  Phosphorus   Collection Time: 01/14/24  3:43 AM  Result Value Ref Range   Phosphorus 4.3 2.5 - 5.0 mg/dL  CBC without Differential   Collection Time: 01/14/24  3:43 AM  Result Value Ref Range   WBC 4.20 (L) 4.40 - 11.00 10*3/uL   RBC 3.40 (L) 4.50 - 5.90 10*6/uL   Hemoglobin 10.5 (L) 14.0 - 17.5 g/dL   Hematocrit 69.6 (L) 58.4 - 50.4 %   Mean Corpuscular Volume (MCV)  89.3 80.0 - 96.0 fL   Mean Corpuscular Hemoglobin (MCH) 30.9 27.5 - 33.2 pg   Mean Corpuscular Hemoglobin Conc (MCHC) 34.6 33.0 - 37.0 g/dL   Red Cell Distribution Width (RDW) 14.4 12.3 - 17.0 %   Platelet Count (PLT) 335 150 - 450 10*3/uL   Mean Platelet Volume (MPV) 9.0 6.8 - 10.2 fL  POC Glucose   Collection Time: 01/14/24  5:50 AM  Result Value Ref Range   Glucose, POC 83 70 - 99 mg/dL  POC Glucose   Collection Time: 01/14/24 11:53 AM  Result Value Ref Range   Glucose, POC 83 70 - 99 mg/dL  POC Glucose   Collection Time: 01/14/24  6:47 PM  Result Value Ref Range   Glucose, POC 83 70 - 99 mg/dL  POC Glucose   Collection Time: 01/14/24 11:50 PM  Result Value Ref Range   Glucose, POC 97 70 - 99 mg/dL  POC Glucose   Collection Time: 01/15/24  6:04 AM  Result Value Ref Range   Glucose, POC 103 (H) 70 -  99 mg/dL

## 2024-01-15 NOTE — Progress Notes (Signed)
 Physician's Medical Necessity Certification Form (Required for Medicare / Medicaid Beneficiaries for non-emergency, scheduled or non-scheduled, ambulance transportation)  Ambulance Company and Contact #: [facility] [phone])  Patient's Name: Ralph [name] Social Security Number: [id number] Address: [address] [place] [address] Phone Number: [phone] (home)  Transfer Information  Date of Service: 01/15/24 Origin of Transport:  [facility] Transport Destination:  [address] [place] [address] Phone: [phone] City: [place] State: [place] Please Check: Residence  Medical Guidelines  Medicare Regulations, Part 410.40 (d)(1) defines Medical Necessity and Bed Confined In order for ambulance service to be covered, they must be medically necessary and reasonable.  Medical necessity is established when the patients' condition is such that transportation by other means is contraindicated.  The Health Care Financing Administration has defined Bed Confinement as: Patient is unable to get up from bed without assistance Patient is unable to ambulate Patient is unable to sit in a chair or wheelchair  If the said patient does not meet Bed Confined criteria as defined in the Medicare regulation, can this patient be transported safely unmonitored in a wheelchair van?  No  If No, please indicate the appropriate medical condition(s):  Severe Weakness, Other (Comment) (CVA, chronic pain, severe protein calorie malnutrition, severe deconditioning due to failure to thrive)  ---------------------------------------------------------------------------------------------------------------------- I certify that the above information is true and correct based on my evaluation of this patient to the best of my knowledge and professional training, as supported in the medical record of this patient.   I understand that this  information will be used by the Department of Health and Health And Safety Inspector, Healthcare Financing Administration to support the determination of medical necessity for ambulance services.  I certify that the above information is true and correct based on my evaluation of this patient to the best of my knowledge and professional training, as supported in the medical record of this patient.  It is understood that I, the Medical Professional; being a Designer, Jewellery, Case Designer, Television/film Set, has received a verbal order from the assigned physician, being Dr. Mitchael Blanch on the Date of 01/15/24, to authorize the medical necessity for the transport of the said patient above.  I understand that this information will be used by the Department of Health and Health And Safety Inspector, Healthcare Financing Administration to support the determination of medical necessity for ambulance services.    Signature of Medical Professional:  Mliss Kitty, MSW Date:  01/15/2024

## 2024-01-16 ENCOUNTER — Telehealth: Payer: Self-pay

## 2024-01-16 NOTE — Transitions of Care (Post Inpatient/ED Visit) (Signed)
° °  01/16/2024  Name: Ralph [name] MRN: [id number] DOB: Jan 26, 1966  Today's TOC FU Call Status: Today's TOC FU Call Status:: Unsuccessful Call (1st Attempt) Unsuccessful Call (1st Attempt) Date: 01/16/24  Attempted to reach the patient regarding the most recent Inpatient/ED visit.  Follow Up Plan: Additional outreach attempts will be made to reach the patient to complete the Transitions of Care (Post Inpatient/ED visit) call.    Bing Edison MSN, RN RN [employer] [facility]  VBCI-Population Health Office Hours M-F [phone] Direct Dial: [phone] Main Phone [phone]  Fax: [phone] [email or web address]

## 2024-01-17 ENCOUNTER — Telehealth: Payer: Self-pay

## 2024-01-17 NOTE — Transitions of Care (Post Inpatient/ED Visit) (Signed)
° °  01/17/2024  Name: JOSHUWA VECCHIO MRN: [id number] DOB: 12/25/1965  Today's TOC FU Call Status: Today's TOC FU Call Status:: Unsuccessful Call (2nd Attempt) Unsuccessful Call (2nd Attempt) Date: 01/17/24  Attempted to reach the patient regarding the most recent Inpatient/ED visit.  Follow Up Plan: Additional outreach attempts will be made to reach the patient to complete the Transitions of Care (Post Inpatient/ED visit) call.   Koralyn Prestage J. Gurshaan Matsuoka RN, MSN [facility], [facility] Health RN Care Manager Direct Dial: [phone]  Fax: [phone] Website: [email or web address]

## 2024-01-21 ENCOUNTER — Telehealth: Payer: Self-pay

## 2024-01-21 NOTE — Transitions of Care (Post Inpatient/ED Visit) (Signed)
° °  01/21/2024  Name: Ralph [name] MRN: [id number] DOB: Feb 22, 1965  Today's TOC FU Call Status: Today's TOC FU Call Status:: Unsuccessful Call (3rd Attempt) Unsuccessful Call (3rd Attempt) Date: 01/21/24  Attempted to reach the patient regarding the most recent Inpatient/ED visit.  Follow Up Plan: No further outreach attempts will be made at this time. We have been unable to contact the patient.  Saud Bail J. Idy Rawling RN, MSN [facility], [facility] Health RN Care Manager Direct Dial: [phone]  Fax: [phone] Website: [email or web address]
